# Patient Record
Sex: Male | Born: 1950 | Race: White | Hispanic: No | Marital: Married | Smoking: Never smoker
Health system: Southern US, Community
[De-identification: ages and names within clinical notes are randomized; demographics above are authoritative.]

## PROBLEM LIST (undated history)

## (undated) DIAGNOSIS — H6121 Impacted cerumen, right ear: Secondary | ICD-10-CM

## (undated) DIAGNOSIS — D696 Thrombocytopenia, unspecified: Secondary | ICD-10-CM

## (undated) DIAGNOSIS — F5101 Primary insomnia: Secondary | ICD-10-CM

## (undated) DIAGNOSIS — B3749 Other urogenital candidiasis: Secondary | ICD-10-CM

## (undated) DIAGNOSIS — L219 Seborrheic dermatitis, unspecified: Secondary | ICD-10-CM

## (undated) DIAGNOSIS — M7918 Myalgia, other site: Secondary | ICD-10-CM

## (undated) DIAGNOSIS — K219 Gastro-esophageal reflux disease without esophagitis: Secondary | ICD-10-CM

## (undated) DIAGNOSIS — E782 Mixed hyperlipidemia: Secondary | ICD-10-CM

## (undated) DIAGNOSIS — I10 Essential (primary) hypertension: Secondary | ICD-10-CM

## (undated) DIAGNOSIS — J069 Acute upper respiratory infection, unspecified: Secondary | ICD-10-CM

## (undated) HISTORY — DX: Seborrheic dermatitis, unspecified: L21.9

## (undated) HISTORY — PX: ABDOMINAL EXPLORATION SURGERY: SHX538

## (undated) HISTORY — DX: Mixed hyperlipidemia: E78.2

## (undated) HISTORY — DX: Thrombocytopenia, unspecified: D69.6

## (undated) HISTORY — DX: Other urogenital candidiasis: B37.49

## (undated) HISTORY — DX: Impacted cerumen, right ear: H61.21

## (undated) HISTORY — PX: ANKLE FRACTURE SURGERY: SHX122

## (undated) HISTORY — DX: Acute upper respiratory infection, unspecified: J06.9

## (undated) HISTORY — DX: Primary insomnia: F51.01

## (undated) HISTORY — DX: Myalgia, other site: M79.18

## (undated) HISTORY — DX: Gastro-esophageal reflux disease without esophagitis: K21.9
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
## (undated) NOTE — *Deleted (*Deleted)
PMR Admission Coordinator Pre-Admission Assessment  Patient: Tim Day is an 51 y.o., male MRN: [id number] DOB: [birth year] Height: 5\' 10"  (177.8 cm) Weight: 86.2 kg  Insurance Information HMO:     PPO:      PCP:      IPA:      80/20:      OTHER:  PRIMARY: [employer]      Policy#: [id number]      Subscriber: pt CM Name: Hilton Cork      Phone#: [phone]     Fax#: [phone] Pre-Cert#: [id number]   Initial denial   Employer:  Benefits:  Phone #: [phone]     Name: 10/20 Eff. Date: 12/03/2019     Deduct: none      Out of Pocket Max: $500      Life Max: none CIR: 80%      SNF: 80% limited to 90 days Outpatient: $17 per visit     Co-Pay: 60 visits combined limit Home Health: 100%      Co-Pay: limited visits per medcial neccesity DME: 100%     Co-Pay: none Providers: in network  SECONDARY: Medicare part A only      Policy#: [id number]     Active 07/02/2018  Financial Counselor:       Phone#:   The "Data Collection Information Summary" for patients in Inpatient Rehabilitation Facilities with attached "Privacy Act Statement-Health Care Records" was provided and verbally reviewed with: Patient and Family  Emergency Contact Information Contact Information    Name Relation Home Work Mobile   Lazar,Betty Spouse   [phone]      Current Medical History  Patient Admitting Diagnosis: Debility; respiratory failure post COVID  History of Present Illness: ***    Patient's medical record from [facility] has been reviewed by the rehabilitation admission coordinator and physician.  Past Medical History  Past Medical History:  Diagnosis Date  . Acute respiratory disease   . Atypical mole 12/30/2012   severe left post shoulder tx exc  . Candidiasis of urogenital sites   . Diabetes mellitus   . Diffuse myofascial pain syndrome   . Esophageal reflux   . Hypertension   . Impacted cerumen of right ear   . Melanoma (HCC) 06/14/2011   left  ear mohs  . Mixed hyperlipidemia   . 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  . Primary insomnia   . Seborrheic dermatitis, unspecified   . Squamous cell carcinoma of skin 06/14/2011   left forearm medial cx3 25fu  . Thrombocytopenia, unspecified (HCC)     Family History   family history includes CAD in his father; Diabetes in his mother.  Prior Rehab/Hospitalizations Has the patient had prior rehab or hospitalizations prior to admission? Yes  Has the patient had major surgery during 100 days prior to admission? Yes   Current Medications  Current Facility-Administered Medications:  .  0.9 %  sodium chloride infusion, 250 mL, Intravenous, Continuous, Gleason, Darcella Gasman, PA-C .  acetaminophen (TYLENOL) suppository 650 mg, 650 mg, Rectal, Q6H PRN, 650 mg at 09/17/20 1826 **OR** acetaminophen (TYLENOL) tablet 650 mg, 650 mg, Oral, Q6H PRN, Gertha Calkin, MD, 650 mg at 09/26/20 0015 .  albuterol (VENTOLIN HFA) 108 (90 Base) MCG/ACT inhaler 2 puff, 2 puff, Inhalation, Q6H PRN, Noralee Stain, DO .  ALPRAZolam Prudy Feeler) tablet 0.25 mg, 0.25 mg, Oral, TID PRN, Hughie Closs, MD, 0.25 mg at 09/26/20 0015 .  alum & mag hydroxide-simeth (  MAALOX/MYLANTA) 200-200-20 MG/5ML suspension 30 mL, 30 mL, Oral, Q4H PRN, Erick Blinks, MD, 30 mL at 09/24/20 2150 .  apixaban (ELIQUIS) tablet 5 mg, 5 mg, Oral, BID, Mosetta Anis, RPH, 5 mg at 09/26/20 1027 .  chlorhexidine (PERIDEX) 0.12 % solution 15 mL, 15 mL, Mouth Rinse, BID, Chand, Sudham, MD, 15 mL at 09/26/20 1027 .  Chlorhexidine Gluconate Cloth 2 % PADS 6 each, 6 each, Topical, Daily, Renda Rolls Z, DO, 6 each at 09/26/20 1028 .  collagenase (SANTYL) ointment, , Topical, BID, Pieter Partridge, MD, Given at 09/26/20 1028 .  docusate sodium (COLACE) capsule 100 mg, 100 mg, Oral, BID, Royce Macadamia, RPH, 100 mg at 09/26/20 1028 .  feeding supplement (ENSURE ENLIVE) (ENSURE ENLIVE) liquid 237 mL,  237 mL, Oral, TID BM, Renda Rolls Z, DO, 237 mL at 09/26/20 1027 .  Gerhardt's butt cream, , Topical, PRN, Lanae Boast, MD, Given at 09/13/20 0855 .  hydrocortisone cream 1 %, , Topical, BID, Renda Rolls Z, [place], Given at 09/26/20 1029 .  insulin aspart (novoLOG) injection 0-15 Units, 0-15 Units, Subcutaneous, TID WC, Kc, Ramesh, MD, 3 Units at 09/26/20 1254 .  insulin aspart (novoLOG) injection 0-5 Units, 0-5 Units, Subcutaneous, QHS, Kc, Ramesh, MD, 2 Units at 09/22/20 2251 .  insulin aspart (novoLOG) injection 15 Units, 15 Units, Subcutaneous, TID WC, Dorcas Carrow, MD, 15 Units at 09/26/20 1254 .  insulin glargine (LANTUS) injection 38 Units, 38 Units, Subcutaneous, BID, Hughie Closs, MD, 38 Units at 09/26/20 1029 .  linagliptin (TRADJENTA) tablet 5 mg, 5 mg, Oral, Daily, Chand, Sudham, MD, 5 mg at 09/26/20 1028 .  MEDLINE mouth rinse, 15 mL, Mouth Rinse, q12n4p, Chand, Sudham, MD, 15 mL at 09/26/20 1255 .  melatonin tablet 3 mg, 3 mg, Oral, QHS, Albertine Grates, MD, 3 mg at 09/25/20 2155 .  ondansetron (ZOFRAN) tablet 4 mg, 4 mg, Oral, Q6H PRN **OR** ondansetron (ZOFRAN) injection 4 mg, 4 mg, Intravenous, Q6H PRN, Chand, Sudham, MD .  pantoprazole (PROTONIX) EC tablet 40 mg, 40 mg, Oral, Daily, Ghimire, Kuber, MD, 40 mg at 09/26/20 1028 .  phenol (CHLORASEPTIC) mouth spray 1 spray, 1 spray, Mouth/Throat, PRN, Kc, Ramesh, MD, 1 spray at 08/31/20 0105 .  pneumococcal 23 valent vaccine (PNEUMOVAX-23) injection 0.5 mL, 0.5 mL, Intramuscular, Prior to discharge, Kc, Ramesh, MD .  polyethylene glycol (MIRALAX / GLYCOLAX) packet 17 g, 17 g, Oral, Daily PRN, Cheri Fowler, MD, 17 g at 09/19/20 0955 .  predniSONE (DELTASONE) tablet 30 mg, 30 mg, Oral, Q breakfast, Noralee Stain, DO, 30 mg at 09/26/20 0834 .  sodium chloride (OCEAN) 0.65 % nasal spray 1 spray, 1 spray, Each Nare, PRN, Kalman Shan, MD .  tamsulosin (FLOMAX) capsule 0.4 mg, 0.4 mg, Oral, Daily, McQuaid, Douglas B, MD, 0.4 mg at  09/26/20 1027 .  zolpidem (AMBIEN) tablet 10 mg, 10 mg, Oral, QHS PRN, Dorcas Carrow, MD, 10 mg at 09/25/20 2203  Patients Current Diet:  Diet Order            Diet Carb Modified Fluid consistency: Thin; Room service appropriate? Yes  Diet effective now                 Precautions / Restrictions Precautions Precautions: Fall Precaution Comments: High RR, watch sats Restrictions Weight Bearing Restrictions: No   Has the patient had 2 or more falls or a fall with injury in the past year? 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 Independent  Indoor Mobility: Did the patient need assistance with walking from room to room (with or without device)? Independent  Stairs: Did the patient need assistance with internal or external stairs (with or without device)? Independent  Functional Cognition: Did the patient need help planning regular tasks such as shopping or remembering to take medications? Independent  Home Assistive Devices / Equipment Home Assistive Devices/Equipment: Oxygen Home Equipment: None  Prior Device Use: Indicate devices/aids used by the patient prior to current illness, exacerbation or injury? None of the above  Current Functional Level Cognition  Overall Cognitive Status: Impaired/Different from baseline Current Attention Level: Selective Orientation Level: Oriented X4 Safety/Judgement: Decreased awareness of safety General Comments: Frequent reassurance to ease anxiety    Extremity Assessment (includes Sensation/Coordination)  Upper Extremity Assessment: Generalized weakness  Lower Extremity Assessment: Defer to PT evaluation    ADLs  Overall ADL's : Needs assistance/impaired Eating/Feeding: Set up, Sitting Grooming: Oral care, Wash/dry face, Applying deodorant, Set up, Sitting Upper Body  Bathing: Sitting, Set up (simulated) Lower Body Bathing: Moderate assistance, Bed level Lower Body Bathing Details (indicate cue type and reason): simulated by raising HOB and using figure four positioning of BLE Upper Body Dressing : Maximal assistance, Sitting Lower Body Dressing: Maximal assistance, Sit to/from stand Lower Body Dressing Details (indicate cue type and reason): while sitting EOB, assisted ptin assuming and maintaining figure 4 position while pt pulled socks up over heels Toilet Transfer: Moderate assistance, +2 for physical assistance, +2 for safety/equipment Toilet Transfer Details (indicate cue type and reason): pt able to progress with use of RW this date Toileting- Architect and Hygiene: Total assistance Toileting - Clothing Manipulation Details (indicate cue type and reason): incontinent of bowels this session Functional mobility during ADLs: Moderate assistance, +2 for physical assistance, Cueing for safety, Cueing for sequencing, Rolling walker General ADL Comments: Pt able to progress to using RW this date for small bouts of mobility. Continues to require increased rest breaks, cues for energy conservation. Pt has implemented strategies from previous OT session by doing more and more for himself.    Mobility  Overal bed mobility: Needs Assistance Bed Mobility: Rolling, Sidelying to Sit Rolling: Min assist Sidelying to sit: Mod assist, HOB elevated Supine to sit: Mod assist, HOB elevated Sit to supine: Mod assist General bed mobility comments: Assist to elevate trunk into sitting    Transfers  Overall transfer level: Needs assistance Equipment used: Rolling walker (2 wheeled) Transfer via Lift Equipment: Stedy Transfers: Sit to/from Stand, [employer] Transfers Sit to Stand: Mod assist, +2 safety/equipment Stand pivot transfers: Mod assist, +2 safety/equipment General transfer comment: Assist to bring hips up and for balance. Repeated verbal cues for  hand placement. Bed to bsc with walker with stand pivot    Ambulation / Gait / Stairs / Wheelchair Mobility  Ambulation/Gait Ambulation/Gait assistance: Mod assist, +2 safety/equipment Gait Distance (Feet): 10 Feet Assistive device: Rolling walker (2 wheeled) Gait Pattern/deviations: Step-to pattern, Shuffle, Narrow base of support, Decreased stride length, Decreased step length - right, Decreased step length - left General Gait Details: Assist for balance and support. Pt needs frequent verbal encouragement to continue.  Gait velocity: decr Gait velocity interpretation: <1.31 ft/sec, indicative of household ambulator    Posture / Balance Dynamic Sitting Balance Sitting balance - Comments: Required UE but stable at EOB. Balance Overall balance assessment: Needs assistance Sitting-balance support: Bilateral upper  extremity supported, Feet supported Sitting balance-Leahy Scale: Poor Sitting balance - Comments: Required UE but stable at EOB. Postural control: Right lateral lean Standing balance support: Bilateral upper extremity supported, During functional activity Standing balance-Leahy Scale: Poor Standing balance comment: walker and min assist. Stood x 90 sec for pericare    Special needs/care consideration Wife has been staying with patient overnight due to hi high anxiety and depression Non latex 14 fr urethral catheter placed 09/06/20       Buttocks bilateral stage 2 with twice daily       dressing changes      Low air loss bed      O2 at 3 liters [place]; BiPAP at HS; did not use      BiPAP or CPAP pta Began BiPAP due to      Hypercarbia and increased work of breathing  Previous Home Environment  Living Arrangements: Spouse/significant other  Lives With: Spouse Available Help at Discharge: Family, Available 24 hours/day Type of Home: House Home Layout: One level Home Access: Stairs to enter Entrance Stairs-Rails: Right Entrance Stairs-Number of Steps: 6 Bathroom Shower/Tub:  Health visitor: Standard Bathroom Accessibility: Yes How Accessible: Accessible via walker Home Care Services: No Additional Comments: Wife taking fmla  Discharge Living Setting Plans for Discharge Living Setting: Patient's home, Lives with (comment) (wife) Type of Home at Discharge: House Discharge Home Layout: One level Discharge Home Access: Stairs to enter Entrance Stairs-Rails: Right Entrance Stairs-Number of Steps: 6 Discharge Bathroom Shower/Tub: Walk-in shower Discharge Bathroom Toilet: Standard Discharge Bathroom Accessibility: Yes How Accessible: Accessible via walker Does the patient have any problems obtaining your medications?: No  Social/Family/Support Systems Patient Roles: Spouse (self employed and very active) Solicitor Information: wife, Kathie Rhodes Anticipated Caregiver: wife Anticipated Industrial/product designer Information: see above Ability/Limitations of Caregiver: wife taking FMLA Caregiver Availability: 24/7 Discharge Plan Discussed with Primary Caregiver: Yes Is Caregiver In Agreement with Plan?: Yes Does Caregiver/Family have Issues with Lodging/Transportation while Pt is in Rehab?: No  Goals Patient/Family Goal for Rehab: supervision PT, supervision to min OT Expected length of stay: ELOS 2 to 3 weeks Pt/Family Agrees to Admission and willing to participate: Yes Program Orientation Provided & Reviewed with Pt/Caregiver Including Roles  & Responsibilities: Yes  Decrease burden of Care through IP rehab admission: n/a  Possible need for SNF placement upon discharge: not anticipated  Patient Condition: I have reviewed medical records from [facility], spoken with CM, and patient and spouse. I met with patient at the bedside for inpatient rehabilitation assessment.  Patient will benefit from ongoing PT and OT, can actively participate in 3 hours of therapy a day 5 days of the week, and can make measurable gains during the admission.  Patient  will also benefit from the coordinated team approach during an Inpatient Acute Rehabilitation admission.  The patient will receive intensive therapy as well as Rehabilitation physician, nursing, social worker, and care management interventions.  Due to bladder management, bowel management, safety, skin/wound care, disease management, medication administration, pain management and patient education the patient requires 24 hour a day rehabilitation nursing.  The patient is currently *** with mobility and basic ADLs.  Discharge setting and therapy post discharge at [facility] IP discharge location:[id number]} is anticipated.  Patient has agreed to participate in the Acute Inpatient Rehabilitation Program and will admit {Time; today/tomorrow:10263}.  Preadmission Screen Completed By:  Clois Dupes, 09/26/2020 3:47 PM ______________________________________________________________________   Discussed status with Dr. Marland Kitchen on *** at *** and received approval  for admission today.  Admission Coordinator:  Clois Dupes, RN, time Marland KitchenDorna Bloom ***   Assessment/Plan: Diagnosis: 1. Does the need for close, 24 hr/day Medical supervision in concert with the patient's rehab needs make it unreasonable for this patient to be served in a less intensive setting? {yes_no_potentially:3041433} 2. Co-Morbidities requiring supervision/potential complications: *** 3. Due to {due ZO:1096045}, does the patient require 24 hr/day rehab nursing? {yes_no_potentially:3041433} 4. Does the patient require coordinated care of a physician, rehab nurse, PT, OT, and SLP to address physical and functional deficits in the context of the above medical diagnosis(es)? {yes_no_potentially:3041433} Addressing deficits in the following areas: {deficits:3041436} 5. Can the patient actively participate in an intensive therapy program of at least 3 hrs of therapy 5 days a week? {yes_no_potentially:3041433} 6. The potential for patient to  make measurable gains while on inpatient rehab is {potential:3041437} 7. Anticipated functional outcomes upon discharge from inpatient rehab: {functional outcomes:304600100} PT, {functional outcomes:304600100} OT, {functional outcomes:304600100} SLP 8. Estimated rehab length of stay to reach the above functional goals is: *** 9. Anticipated discharge destination: {anticipated dc setting:21604} 10. Overall Rehab/Functional Prognosis: {potential:3041437}   MD Signature: ***

---

## 2002-10-01 ENCOUNTER — Ambulatory Visit (HOSPITAL_COMMUNITY): Admission: RE | Admit: 2002-10-01 | Discharge: 2002-10-01 | Payer: Self-pay | Admitting: Family Medicine

## 2003-09-12 ENCOUNTER — Encounter: Payer: Self-pay | Admitting: Family Medicine

## 2003-09-12 ENCOUNTER — Ambulatory Visit (HOSPITAL_COMMUNITY): Admission: RE | Admit: 2003-09-12 | Discharge: 2003-09-12 | Payer: Self-pay | Admitting: Family Medicine

## 2003-09-19 ENCOUNTER — Ambulatory Visit (HOSPITAL_COMMUNITY): Admission: RE | Admit: 2003-09-19 | Discharge: 2003-09-19 | Payer: Self-pay | Admitting: Family Medicine

## 2003-09-19 ENCOUNTER — Encounter: Payer: Self-pay | Admitting: Family Medicine

## 2005-03-22 ENCOUNTER — Ambulatory Visit (HOSPITAL_COMMUNITY): Admission: RE | Admit: 2005-03-22 | Discharge: 2005-03-22 | Payer: Self-pay | Admitting: Family Medicine

## 2005-03-28 ENCOUNTER — Ambulatory Visit (HOSPITAL_COMMUNITY): Admission: RE | Admit: 2005-03-28 | Discharge: 2005-03-28 | Payer: Self-pay | Admitting: Family Medicine

## 2006-12-02 HISTORY — PX: SHOULDER SURGERY: SHX246

## 2008-08-17 ENCOUNTER — Emergency Department (HOSPITAL_COMMUNITY): Admission: EM | Admit: 2008-08-17 | Discharge: 2008-08-17 | Payer: Self-pay | Admitting: Emergency Medicine

## 2008-08-25 ENCOUNTER — Ambulatory Visit (HOSPITAL_BASED_OUTPATIENT_CLINIC_OR_DEPARTMENT_OTHER): Admission: RE | Admit: 2008-08-25 | Discharge: 2008-08-26 | Payer: Self-pay | Admitting: Orthopedic Surgery

## 2008-09-01 ENCOUNTER — Encounter: Admission: RE | Admit: 2008-09-01 | Discharge: 2008-09-01 | Payer: Self-pay | Admitting: Orthopedic Surgery

## 2008-11-07 ENCOUNTER — Encounter: Admission: RE | Admit: 2008-11-07 | Discharge: 2008-11-07 | Payer: Self-pay | Admitting: Orthopedic Surgery

## 2008-12-14 ENCOUNTER — Encounter: Admission: RE | Admit: 2008-12-14 | Discharge: 2008-12-14 | Payer: Self-pay | Admitting: Orthopedic Surgery

## 2008-12-28 ENCOUNTER — Encounter: Admission: RE | Admit: 2008-12-28 | Discharge: 2008-12-28 | Payer: Self-pay | Admitting: Orthopedic Surgery

## 2009-01-04 ENCOUNTER — Ambulatory Visit: Payer: Self-pay | Admitting: Vascular Surgery

## 2009-01-30 ENCOUNTER — Ambulatory Visit (HOSPITAL_COMMUNITY): Admission: RE | Admit: 2009-01-30 | Discharge: 2009-01-31 | Payer: Self-pay | Admitting: Orthopedic Surgery

## 2009-07-31 ENCOUNTER — Observation Stay (HOSPITAL_COMMUNITY): Admission: RE | Admit: 2009-07-31 | Discharge: 2009-08-01 | Payer: Self-pay | Admitting: Urology

## 2009-07-31 ENCOUNTER — Encounter (INDEPENDENT_AMBULATORY_CARE_PROVIDER_SITE_OTHER): Payer: Self-pay | Admitting: Urology

## 2011-01-15 ENCOUNTER — Telehealth: Payer: Self-pay | Admitting: Family Medicine

## 2011-02-01 NOTE — Telephone Encounter (Signed)
Opened in error

## 2011-03-09 LAB — GLUCOSE, CAPILLARY
Glucose-Capillary: 126 mg/dL — ABNORMAL HIGH (ref 70–99)
Glucose-Capillary: 145 mg/dL — ABNORMAL HIGH (ref 70–99)
Glucose-Capillary: 152 mg/dL — ABNORMAL HIGH (ref 70–99)
Glucose-Capillary: 156 mg/dL — ABNORMAL HIGH (ref 70–99)
Glucose-Capillary: 162 mg/dL — ABNORMAL HIGH (ref 70–99)
Glucose-Capillary: 174 mg/dL — ABNORMAL HIGH (ref 70–99)
Glucose-Capillary: 176 mg/dL — ABNORMAL HIGH (ref 70–99)

## 2011-03-09 LAB — CBC
HCT: 45.1 % (ref 39.0–52.0)
Hemoglobin: 15.7 g/dL (ref 13.0–17.0)
MCHC: 34.8 g/dL (ref 30.0–36.0)
MCV: 85.8 fL (ref 78.0–100.0)
Platelets: 151 10*3/uL (ref 150–400)
RBC: 5.25 MIL/uL (ref 4.22–5.81)
RDW: 13.8 % (ref 11.5–15.5)
WBC: 5.9 10*3/uL (ref 4.0–10.5)

## 2011-03-09 LAB — COMPREHENSIVE METABOLIC PANEL
ALT: 32 U/L (ref 0–53)
AST: 23 U/L (ref 0–37)
Albumin: 3.9 g/dL (ref 3.5–5.2)
Alkaline Phosphatase: 49 U/L (ref 39–117)
BUN: 16 mg/dL (ref 6–23)
CO2: 26 mEq/L (ref 19–32)
Calcium: 9.8 mg/dL (ref 8.4–10.5)
Chloride: 106 mEq/L (ref 96–112)
Creatinine, Ser: 1 mg/dL (ref 0.4–1.5)
GFR calc Af Amer: >60 mL/min (ref >60)
GFR calc non Af Amer: >60 mL/min (ref >60)
Glucose, Bld: 144 mg/dL — ABNORMAL HIGH (ref 70–99)
Potassium: 4.2 mEq/L (ref 3.5–5.1)
Sodium: 138 mEq/L (ref 135–145)
Total Bilirubin: 0.8 mg/dL (ref 0.3–1.2)
Total Protein: 6.7 g/dL (ref 6.0–8.3)

## 2011-03-09 LAB — TYPE AND SCREEN
ABO/RH(D): B NEG
Antibody Screen: NEGATIVE

## 2011-03-09 LAB — PROTIME-INR
INR: 1 (ref 0.00–1.49)
Prothrombin Time: 13.4 seconds (ref 11.6–15.2)

## 2011-03-09 LAB — ABO/RH: ABO/RH(D): B NEG

## 2011-03-09 LAB — APTT: aPTT: 28 seconds (ref 24–37)

## 2011-03-14 LAB — CBC
HCT: 36 % — ABNORMAL LOW (ref 39.0–52.0)
Hemoglobin: 12.7 g/dL — ABNORMAL LOW (ref 13.0–17.0)
RDW: 14.8 % (ref 11.5–15.5)
WBC: 5.9 10*3/uL (ref 4.0–10.5)

## 2011-03-14 LAB — BASIC METABOLIC PANEL
Calcium: 8.5 mg/dL (ref 8.4–10.5)
GFR calc Af Amer: >60 mL/min (ref >60)
GFR calc non Af Amer: >60 mL/min (ref >60)
Glucose, Bld: 157 mg/dL — ABNORMAL HIGH (ref 70–99)
Potassium: 4 mEq/L (ref 3.5–5.1)
Sodium: 135 mEq/L (ref 135–145)

## 2011-03-14 LAB — PROTIME-INR: Prothrombin Time: 13.9 seconds (ref 11.6–15.2)

## 2011-03-14 LAB — GLUCOSE, CAPILLARY
Glucose-Capillary: 131 mg/dL — ABNORMAL HIGH (ref 70–99)
Glucose-Capillary: 153 mg/dL — ABNORMAL HIGH (ref 70–99)

## 2011-03-19 LAB — DIFFERENTIAL
Lymphocytes Relative: 28 % (ref 12–46)
Lymphs Abs: 1.5 10*3/uL (ref 0.7–4.0)
Neutro Abs: 3.3 10*3/uL (ref 1.7–7.7)
Neutrophils Relative %: 62 % (ref 43–77)

## 2011-03-19 LAB — BASIC METABOLIC PANEL
BUN: 13 mg/dL (ref 6–23)
Calcium: 9.3 mg/dL (ref 8.4–10.5)
Creatinine, Ser: 0.81 mg/dL (ref 0.4–1.5)
GFR calc non Af Amer: >60 mL/min (ref >60)
Potassium: 4.2 mEq/L (ref 3.5–5.1)

## 2011-03-19 LAB — CBC
HCT: 43.8 % (ref 39.0–52.0)
Platelets: 158 10*3/uL (ref 150–400)
WBC: 5.4 10*3/uL (ref 4.0–10.5)

## 2011-03-19 LAB — URINALYSIS, ROUTINE W REFLEX MICROSCOPIC
Ketones, ur: NEGATIVE mg/dL
Protein, ur: NEGATIVE mg/dL
Urobilinogen, UA: 0.2 mg/dL (ref 0.0–1.0)

## 2011-03-19 LAB — PROTIME-INR
INR: 3.6 — ABNORMAL HIGH (ref 0.00–1.49)
Prothrombin Time: 38.8 seconds — ABNORMAL HIGH (ref 11.6–15.2)

## 2011-03-19 LAB — URINE MICROSCOPIC-ADD ON

## 2011-04-16 NOTE — Assessment & Plan Note (Signed)
OFFICE VISIT   Tim Day, Tim Day  DOB:  04/13/1951                                       01/04/2009  EAVWU#:[id number]   The patient is a 60 year old referred by Dr. Ranell Patrick for evaluation of  left upper extremity DVT.  In September of 2009 he had a subscapularis  repair and shoulder arthroscopy.  After the procedure he developed a  left upper extremity DVT.  He was placed on Coumadin for this.  He did  not have any evidence of pulmonary embolus.  He developed left upper  extremity arm swelling 3-4 days after his procedure.  Since being on  Coumadin the arm swelling has subsided significantly.  He now requires  further repair on his left shoulder and is referred by Dr. Ranell Patrick for  evaluation of his DVT with recommendations regarding his Coumadin  therapy.   His atherosclerotic risk factors include diabetes, elevated cholesterol,  hypertension.   MEDICATIONS:  1. Include Avandamet 03/999 one tablet b.i.d.  2. Januvia 100 mg daily.  3. Crestor 5 mg once a day.  4. Diovan 160 mg once a day.  5. Warfarin 15 mg daily.   ALLERGIES:  He lists allergies to Ceclor which causes a rash and  oxycodone which causes a rash.   PAST SURGICAL HISTORY:  He had facial fractures in the past.   PAST MEDICAL HISTORY:  No other prior episodes of DVT or pulmonary  embolus.  Otherwise, as listed above.   FAMILY HISTORY:  Unremarkable.   SOCIAL HISTORY:  He is married.  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  He is a nonsmoker and nonconsumer of alcohol.   REVIEW OF SYSTEMS:  For cardiac, pulmonary, GI, renal, vascular,  neurologic, orthopedic, psychiatric, ENT and hematologic were all  negative.   PHYSICAL EXAM:  Vital signs:  Blood pressure is 146/93 in the right arm,  pulse is 90 and regular, temperature 98.4.  HEENT:  Unremarkable.  Neck:  Has 2+ carotid pulses without bruit.  Chest:  Clear to auscultation.  Cardiac:  Exam is regular rate and  rhythm.  Abdomen:  Is slightly obese,  soft, nontender, nondistended with no masses.  Extremities:  Left upper  extremity he has edema over the triceps muscle posteriorly in the left  upper arm.  He has 2+ brachial and radial pulses bilaterally.  He has no  significant forearm or hand edema bilaterally.   He had a venous duplex exam of his left upper extremity today.  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  This was at the mid distal brachium level.  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  There was no evidence of  acute DVT in the left upper extremity.   The patient has had now almost 4-1/2 months of Coumadin therapy.  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  It  seems chronic in appearance on duplex exam and well incorporated.  I  believe his DVT risk for his next shoulder procedure should be similar  to that of any other patient undergoing a shoulder orthopedic procedure.  I discussed with the patient and with Dr. Ranell Patrick today DVT prophylaxis  measures for the patient at his next procedure.  I also explained  to the  patient that he still would be at risk of DVT just as he was on the  initial procedure but that his risk of pulmonary embolus from his  chronic DVT would be extremely low and that overall his DVT or pulmonary  embolus risk should be no higher than anyone undergoing a shoulder  procedure.   Janetta Hora. Fields, MD  Electronically Signed   CEF/MEDQ  D:  01/05/2009  T:  01/05/2009  Job:  [id number]   cc:   Almedia Balls. Ranell Patrick, M.D.
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
## 2011-04-16 NOTE — Op Note (Signed)
NAMEBRANNEN, KOPPEN NO.:  [id number]   MEDICAL RECORD NO.:  [id number]          PATIENT TYPE:  OIB   LOCATION:  5041                         FACILITY:  [facility]   PHYSICIAN:  Almedia Balls. Ranell Patrick, M.D. DATE OF BIRTH:  05-28-1951   DATE OF PROCEDURE:  01/30/2009  DATE OF DISCHARGE:                               OPERATIVE REPORT   PREOPERATIVE DIAGNOSIS:  Left shoulder stiffness following subscapularis  and rotator cuff repairs.   POSTOPERATIVE DIAGNOSIS:  Left shoulder stiffness following subscapular  and rotator cuff repairs.   PROCEDURES PERFORMED:  1. Left shoulder exam under anesthesia.  2. Shoulder arthroscopy with extensive intraarticular debridement      including arthroscopic capsular release and debridement and lysis      of adhesions followed by subacromial lysis of adhesions and then      open lysis of adhesions.   ATTENDING SURGEON:  Almedia Balls. Ranell Patrick, MD   ASSISTANT:  Donnie Coffin. Dixon, PA-C   ANESTHESIA:  General anesthesia was used.   ESTIMATED BLOOD LOSS:  Minimal.   FLUID REPLACEMENT:  1200 mL crystalloid.   INSTRUMENT COUNTS:  Correct.   COMPLICATIONS:  None.   Preoperative antibiotics were given.   INDICATIONS:  The patient is a 60 year old male who suffered a posterior  shoulder dislocation and subscapularis injury, which was treated by  shoulder arthroscopy and open subscapularis and rotator cuff repair.  The patient went on to heal his shoulder surgery, but developed  postoperative stiffness.  He also had a postoperative upper extremity  DVT.  The patient was subsequently treated for that and now presents  back for stiff shoulder and likely lysis of adhesions.  Informed consent  was obtained.   DESCRIPTION OF PROCEDURE:  After an adequate level of anesthesia  achieved, the patient was positioned supine on the operating table.  He  was brought up in the modified beach chair position.  All neurovascular  structures were padded  appropriately.  The left shoulder was examined.  Passively forward elevation about 95 degrees.  Abduction was limited to  45 degrees.  External rotation is 0.  Internal rotation is about 30  degrees with the arm abducted.  We went ahead and sterilely prepped and  draped the shoulder in the usual manner.  We entered using standard  arthroscopic portals including anterior, posterior, and lateral portals.  The patient had a very stiff shoulder, very little space inside.  We  identified the subscapularis, which was intact.  We went ahead and  released glenohumeral ligament in the surrounding of rotator interval as  well as the anteroinferior glenohumeral ligament.  We did a near 360-  degree capsular release.  We could not quite get down to the bottom of  the shoulder, but I did manipulate once we were done with the anterior  and posterior releases.  There was noted to be significant  chondromalacia present posteroinferiorly consistent with his prior  dislocation.  In absence of the posterior labrum with the capsular  release complete, the manipulation performed, we had about 60-degree  rotation in terms of external rotation, which was much much improved.  The  results are a little bit of pop with forward elevation manipulation  indicating posteroinferior capsular disruption.  At this point, we  placed the scope in the subacromial space, had a thorough subacromial  release of adhesions.  Next, I went ahead and made a small incision in  the anterior incision where the mini-open incision was used before.  I  put my finger in that incision and was able to sweep the subscapularis,  which was palpable and felt to be intact.  Rotator interval was  released.  I did not feel any undue scar tissue to the coracoid and  released up onto the acromion and under the deltoid and made sure the  entire subdeltoid interval was released and it was.  At this point, the  range of motion was significantly improved.   We concluded the surgery  with suturing the wounds with 4-0 Monocryl followed by Steri-Strips and  sterile dressing.  The patient tolerated the surgery well.      Almedia Balls. Ranell Patrick, M.D.  Electronically Signed     SRN/MEDQ  D:  01/30/2009  T:  01/31/2009  Job:  [id number]

## 2011-04-16 NOTE — Op Note (Signed)
NAMEPRATT, Tim Day          ACCOUNT NO.:  [id number]   MEDICAL RECORD NO.:  [id number]          PATIENT TYPE:  INP   LOCATION:  0099                         FACILITY:  [facility]   PHYSICIAN:  Mark C. Vernie Ammons, M.D.  DATE OF BIRTH:  [birth year]   DATE OF PROCEDURE:  07/31/2009  DATE OF DISCHARGE:                               OPERATIVE REPORT   PREOPERATIVE DIAGNOSIS:  Pelvic mass.   POSTOPERATIVE DIAGNOSIS:  Pelvic mass.   PROCEDURE:  Exploritory laparotomy.  Excision of pelvic mass.   SURGEON:  Mark C. Vernie Ammons, M.D.   ASSISTANT:  Edward Qualia.   ANESTHESIA:  General.   DRAINS:  20-French Foley catheter.   SPECIMENS:  Pelvic mass to pathology.   ESTIMATED BLOOD LOSS:  Minimal.   COMPLICATIONS:  None.   INDICATIONS:  The patient is a 60 year old male who was seen initially  for microscopic hematuria.  His workup included a CT scan which revealed  normal upper tracts and the finding of a stippled calcification of a  mass near the dome of the bladder.  Cystoscopy revealed no lesions  within the bladder and specifically no evidence of any abnormality in  the area of the dome/urachus.  He has a history of having undergone some  form of hypospadias repair as an infant and has a lower midline scar  with some extra scar in the middle, almost suggestive of a suprapubic  catheter scar.  I discussed with the patient the need for surgical  excision of this concerning lesion and went over the procedure in  detail, as well as its risks, complications, and the alternatives.  He  understands and elected to proceed.   DESCRIPTION OF OPERATION:  After informed consent, the patient was  brought to the major OR, placed on the table, and administered general  anesthesia, then had his abdomen and genitalia sterilely prepped and  draped.  An official time-out was then performed.  The urethra was noted  to be located in a subcoronal location to the right of the midline,  consistent with  his previous hypospadias repair.  This was dilated with  Sissy Hoff sounds to 22-French and then a 20-French Foley catheter was  passed into the bladder without difficulty.   A midline incision was then made in the area of his previous scar and  carried down through the external rectus fascia.  The rectus muscle  bellies were identified and parted in the midline, although there was a  great deal of scar in this location.  Further incision through the scar  allowed entry into the abdominal cavity in a controlled fashion.  The  peritoneal incision was then lengthened proximally and distally.   I was able to palpate the lesion at the dome of the bladder and if felt  smooth and mobile.  It did not appear to be associated with the urachal  remnant.  I therefore placed a retractor and was able to visualize the  lesion.  The bladder was then filled with sterile saline and the lesion  could be identified, but it was not associated with the exact dome, but  more posterior.  It appeared smooth and round, as well as flat.  There  were some adhesions of the bowel that were taken off of the superior  aspect and the inferior aspect was dissected from the peritoneum, which  was overlying the bladder.  It did not appear to involve the detrusor  muscle in any way.  This was sent for frozen section.   After frozen section report returned, showing the lesion was calcified  and could not be easily cut for frozen section; however, it appeared to  the pathologist to be calcified fat, which is clinically what is  appeared to be as well.  We therefore elected not to do any further  excision, as it appeared the lesion was benign.  The peritoneum was  irrigated and attention was then directed to the bladder.   The detrusor muscle was noted to have been adherent to the undersurface  of the midline scar and the detrusor had been thinned somewhat.  I  therefore reapproximated the detrusor muscle in a transverse  fashion  using running 3-0 chromic suture.  The wound was irrigated again and the  anterior rectus fascia was then reapproximated in the midline with 0 PDS  suture.  The wound was irrigated a final time and the skin was  reapproximated with skin staples.  The patient tolerated the procedure  well and there were no intraoperative complications.  Needle, sponge,  and instrument counts were correct x2 at the end of the operation.   He will be observed overnight with the plan to discharge home in the  morning.      Mark C. Vernie Ammons, M.D.  Electronically Signed     MCO/MEDQ  D:  07/31/2009  T:  07/31/2009  Job:  [id number]

## 2011-04-16 NOTE — Procedures (Signed)
DUPLEX DEEP VENOUS EXAM - UPPER EXTREMITY   INDICATION:  Left upper extremity DVT.   HISTORY:  Edema:  No.  Trauma/Surgery:  Left shoulder surgery on 08/25/2008.  Pain:  No.  PE:  No.  Previous DVT:  The patient developed a left brachial vein deep venous  thrombosis following shoulder surgery last fall that has been followed  by an outside facility for the last few months.  Anticoagulants:  Yes.  Other:   DUPLEX EXAM:                                             Bas/                IJV   SCV     AXV    BrachV  Ceph V                R  L  R   L   R  L   R   L   R  L  Thrombosis    0  0  0   0      0       +      0  Spontaneous   +  +  +   +      +       0      +  Phasic        +  +  +   +      +       0      +  Augmentation  +  +  +   +      +       0      +  Compressible  +  +  +   +      +       0      +  Competent  Legend:  + - yes  o - no  p - partial  D - decreased   IMPRESSION:  1. 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  2. No evidence of acute deep venous thrombosis is noted in the left      upper extremity.   ___________________________________________  Janetta Hora Fields, MD   CH/MEDQ  D:  01/04/2009  T:  01/04/2009  Job:  [id number]

## 2011-04-19 NOTE — Procedures (Signed)
   NAME:  Tim Day, Tim Day                    ACCOUNT NO.:  [id number]   MEDICAL RECORD NO.:  [id number]                   PATIENT TYPE:  OUT   LOCATION:  [facility]                                 FACILITY:  [facility]   PHYSICIAN:  Edward L. Juanetta Gosling, M.D.             DATE OF BIRTH:  12/05/1950   DATE OF PROCEDURE:  10/01/2002  DATE OF DISCHARGE:                                    STRESS TEST   INDICATIONS FOR PROCEDURE:  The patient is undergoing his graded exercise  test as part of a physical examination.  There are no contraindications to  graded exercise testing.   The patient exercised for 10 minutes and 15 seconds on the Bruce protocol  reaching and sustaining 12.9 METS.  His maximum recorded heart rate was 156,  which is 91% of his age-predicted maximal heart rate.  He had no  electrocardiographic changes suggestive of inducible ischemia.  No symptoms  during exercise, normal blood pressure response to exercise.   IMPRESSION:  1. Good exercise tolerance.  2. No evidence of inducible ischemia.  3. Normal blood pressure response to exercise.  4. No symptoms.  5. Normal graded exercise test.                                                Ramon Dredge L. Juanetta Gosling, M.D.    ELH/MEDQ  D:  10/01/2002  T:  10/01/2002  Job:  [id number]   cc:   Angus G. Renard Matter, M.D.

## 2011-06-14 ENCOUNTER — Other Ambulatory Visit: Payer: Self-pay

## 2011-06-14 DIAGNOSIS — C439 Malignant melanoma of skin, unspecified: Secondary | ICD-10-CM

## 2011-06-14 DIAGNOSIS — C4492 Squamous cell carcinoma of skin, unspecified: Secondary | ICD-10-CM

## 2011-06-14 HISTORY — DX: Malignant melanoma of skin, unspecified: C43.9

## 2011-06-14 HISTORY — DX: Squamous cell carcinoma of skin, unspecified: C44.92

## 2011-09-02 LAB — GLUCOSE, CAPILLARY
Glucose-Capillary: 138 — ABNORMAL HIGH
Glucose-Capillary: 157 — ABNORMAL HIGH
Glucose-Capillary: 266 — ABNORMAL HIGH

## 2011-09-02 LAB — BASIC METABOLIC PANEL
BUN: 12
Calcium: 8.7
Chloride: 106
Creatinine, Ser: 0.99
GFR calc Af Amer: >60
GFR calc non Af Amer: >60
GFR calc non Af Amer: >60
Potassium: 4.3
Sodium: 135
Sodium: 140

## 2011-09-02 LAB — URINALYSIS, ROUTINE W REFLEX MICROSCOPIC
Bilirubin Urine: NEGATIVE
Glucose, UA: NEGATIVE
Ketones, ur: NEGATIVE
Specific Gravity, Urine: 1.02
pH: 6.5

## 2011-09-02 LAB — CBC
HCT: 40.2
Hemoglobin: 13.7
MCV: 86.2
Platelets: 203
WBC: 4.8

## 2011-09-02 LAB — PROTIME-INR: Prothrombin Time: 14.9

## 2011-09-02 LAB — DIFFERENTIAL
Eosinophils Absolute: 0.1
Eosinophils Relative: 3
Lymphocytes Relative: 22
Lymphs Abs: 1.1
Monocytes Absolute: 0.4
Monocytes Relative: 8

## 2011-12-25 ENCOUNTER — Other Ambulatory Visit: Payer: Self-pay | Admitting: Physician Assistant

## 2012-01-21 ENCOUNTER — Telehealth: Payer: Self-pay

## 2012-01-21 NOTE — Telephone Encounter (Signed)
LMOM to call.

## 2012-01-31 NOTE — Telephone Encounter (Signed)
Letter mailed to call.  

## 2012-03-30 ENCOUNTER — Telehealth: Payer: Self-pay

## 2012-03-30 DIAGNOSIS — Z139 Encounter for screening, unspecified: Secondary | ICD-10-CM

## 2012-03-30 NOTE — Telephone Encounter (Signed)
Called and could not leave a message.

## 2012-03-31 NOTE — Telephone Encounter (Signed)
See triage paper. Waiting for call back with med list.

## 2012-04-01 NOTE — Telephone Encounter (Signed)
Gastroenterology Pre-Procedure Form  Pt not scheduled yet. Waiting on June schedule/ needs a mon   Early due to being diabetic  Request Date: 03/31/2012         Requesting Physician: Dr. Dwana Melena     PATIENT INFORMATION:  Tim Day is a 61 y.o., male (DOB=[birth year]).  PROCEDURE: Procedure(s) requested: colonoscopy Procedure Reason: screening for colon cancer  PATIENT REVIEW QUESTIONS: The patient reports the following:   1. Diabetes Melitis: yes  2. Joint replacements in the past 12 months: no 3. Major health problems in the past 3 months: no 4. Has an artificial valve or MVP:no 5. Has been advised in past to take antibiotics in advance of a procedure like teeth cleaning: no}    MEDICATIONS & ALLERGIES:    Patient reports the following regarding taking any blood thinners:   Plavix? no Aspirin?yes  Coumadin?  no  Patient confirms/reports the following medications:  Current Outpatient Prescriptions  Medication Sig Dispense Refill  . aspirin 325 MG tablet Take 325 mg by mouth daily.      Marland Kitchen esomeprazole (NEXIUM) 40 MG capsule Take 40 mg by mouth daily before breakfast.      . metFORMIN (GLUCOPHAGE) 1000 MG tablet Take 1,000 mg by mouth 2 (two) times daily with a meal.      . NON FORMULARY Beta Prostate   Takes two tablets daily      . rosuvastatin (CRESTOR) 5 MG tablet Take 5 mg by mouth daily.      . valsartan (DIOVAN) 160 MG tablet Take 160 mg by mouth daily.        Patient confirms/reports the following allergies:  No Known Allergies  Patient is appropriate to schedule for requested procedure(s): yes  AUTHORIZATION INFORMATION Primary Insurance:   ID #:  Group #:  Pre-Cert / Auth required: Pre-Cert / Auth #:   Secondary Insurance:   ID #:  Group #:  Pre-Cert / Auth required: Pre-Cert / Auth #:   No orders of the defined types were placed in this encounter.    SCHEDULE INFORMATION: Procedure has been scheduled as follows:  Date:             Time:     Location:  This Gastroenterology Pre-Precedure Form is being routed to the following provider(s) for review: R. Roetta Sessions, MD

## 2012-04-02 NOTE — Telephone Encounter (Signed)
Day of prep: metformin 500mg  bid. Ok to schedule.

## 2012-04-08 ENCOUNTER — Other Ambulatory Visit: Payer: Self-pay

## 2012-04-08 DIAGNOSIS — Z139 Encounter for screening, unspecified: Secondary | ICD-10-CM

## 2012-04-08 MED ORDER — PEG 3350-KCL-NA BICARB-NACL 420 G PO SOLR: ORAL | Status: AC

## 2012-04-08 NOTE — Telephone Encounter (Signed)
Rx and instructions mailed to pt. With appt date and time.   I gave him an appointment for 05/25/2012 @ 7:30 AM. He wanted a Monday in June and that is the only Mon that Dr. Jena Gauss will be in Endo in June. I could not reach him by phone.

## 2012-04-20 ENCOUNTER — Emergency Department (HOSPITAL_COMMUNITY)
Admission: EM | Admit: 2012-04-20 | Discharge: 2012-04-20 | Disposition: A | Discharge status: Home / Self Care | Payer: 59 | Attending: Emergency Medicine | Admitting: Emergency Medicine

## 2012-04-20 ENCOUNTER — Emergency Department (HOSPITAL_COMMUNITY): Payer: 59

## 2012-04-20 ENCOUNTER — Encounter (HOSPITAL_COMMUNITY): Payer: Self-pay | Admitting: *Deleted

## 2012-04-20 DIAGNOSIS — M7989 Other specified soft tissue disorders: Secondary | ICD-10-CM | POA: Insufficient documentation

## 2012-04-20 DIAGNOSIS — E119 Type 2 diabetes mellitus without complications: Secondary | ICD-10-CM | POA: Insufficient documentation

## 2012-04-20 DIAGNOSIS — W208XXA Other cause of strike by thrown, projected or falling object, initial encounter: Secondary | ICD-10-CM | POA: Insufficient documentation

## 2012-04-20 DIAGNOSIS — S61219A Laceration without foreign body of unspecified finger without damage to nail, initial encounter: Secondary | ICD-10-CM

## 2012-04-20 DIAGNOSIS — S61209A Unspecified open wound of unspecified finger without damage to nail, initial encounter: Secondary | ICD-10-CM | POA: Insufficient documentation

## 2012-04-20 MED ORDER — BUPIVACAINE HCL (PF) 0.5 % IJ SOLN
INTRAMUSCULAR | Status: AC
  Filled 2012-04-20: qty 30

## 2012-04-20 MED ORDER — TETANUS-DIPHTH-ACELL PERTUSSIS 5-2.5-18.5 LF-MCG/0.5 IM SUSP
0.5000 mL | Freq: Once | INTRAMUSCULAR | Status: AC
  Administered 2012-04-20: 0.5 mL via INTRAMUSCULAR
  Filled 2012-04-20: qty 0.5

## 2012-04-20 MED ORDER — BUPIVACAINE HCL (PF) 0.5 % IJ SOLN
30.0000 mL | Freq: Once | INTRAMUSCULAR | Status: AC
  Administered 2012-04-20: 30 mL
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
## 2012-04-20 NOTE — ED Provider Notes (Signed)
History     CSN: [id number]  Arrival date & time 04/20/12  0704   First MD Initiated Contact with Patient 04/20/12 0813      Chief Complaint  Patient presents with  . Extremity Laceration    (Consider location/radiation/quality/duration/timing/severity/associated sxs/prior treatment) HPI Comments: Pt dropped ~ 60 lb steel concrete form lid on R ring finger.   ? Last dT.  R hand dominant.  The history is provided by the patient. No language interpreter was used.    Past Medical History  Diagnosis Date  . Diabetes mellitus     Past Surgical History  Procedure Date  . Shoulder surgery     No family history on file.  History  Substance Use Topics  . Smoking status: Never Smoker   . Smokeless tobacco: Not on file  . Alcohol Use: No      Review of Systems  Skin: Positive for wound.  Neurological: Negative for weakness and numbness.  All other systems reviewed and are negative.    Allergies  Eggs or egg-derived products  Home Medications   Current Outpatient Rx  Name Route Sig Dispense Refill  . ASPIRIN 325 MG PO TABS Oral Take 325 mg by mouth daily.    Marland Kitchen CINNAMON PO Oral Take 1 tablet by mouth daily.    . OMEGA-3 FATTY ACIDS 1000 MG PO CAPS Oral Take 1 g by mouth daily.    Marland Kitchen METFORMIN HCL 1000 MG PO TABS Oral Take 1,000 mg by mouth 2 (two) times daily with a meal.    . VALSARTAN 160 MG PO TABS Oral Take 160 mg by mouth daily.      BP 142/88  Pulse 84  Temp(Src) 98.1 F (36.7 C) (Oral)  Resp 16  SpO2 95%  Physical Exam  Nursing note and vitals reviewed. Constitutional: He is oriented to person, place, and time. He appears well-developed and well-nourished.  HENT:  Head: Normocephalic and atraumatic.  Eyes: EOM are normal.  Neck: Normal range of motion.  Cardiovascular: Normal rate, regular rhythm, normal heart sounds and intact distal pulses.   Pulmonary/Chest: Effort normal and breath sounds normal. No respiratory distress.  Abdominal: Soft. He  exhibits no distension. There is no tenderness.  Musculoskeletal: Normal range of motion. He exhibits tenderness.       Right hand: He exhibits tenderness, bony tenderness, laceration and swelling. He exhibits normal range of motion, normal capillary refill and no deformity. normal sensation noted. Normal strength noted.       Hands:      transverse laceration to R volar DIP.  Superficialis and profundus function intact.  No active bleeding.  Neurological: He is alert and oriented to person, place, and time.  Skin: Skin is warm and dry.  Psychiatric: He has a normal mood and affect. Judgment normal.    ED Course  LACERATION REPAIR Date/Time: 04/20/2012 9:35 AM Performed by: Worthy Rancher Authorized by: Worthy Rancher Consent: Verbal consent obtained. Written consent not obtained. Risks and benefits: risks, benefits and alternatives were discussed Consent given by: patient Patient understanding: patient states understanding of the procedure being performed Site marked: the operative site was not marked Imaging studies: imaging studies available Required items: required blood products, implants, devices, and special equipment available Patient identity confirmed: verbally with patient Time out: Immediately prior to procedure a "time out" was called to verify the correct patient, procedure, equipment, support staff and site/side marked as required. Laceration length: 2 cm Foreign bodies: no foreign bodies Tendon  involvement: none Nerve involvement: none Vascular damage: no Local anesthetic: bupivacaine 0.5% without epinephrine Anesthetic total: 8 ml Patient sedated: no Preparation: Patient was prepped and draped in the usual sterile fashion. Irrigation solution: saline Irrigation method: syringe Amount of cleaning: extensive Debridement: none Degree of undermining: none Skin closure: 4-0 nylon Number of sutures: 4 Technique: simple Approximation: close Approximation  difficulty: simple Dressing: 4x4 sterile gauze and antibiotic ointment Patient tolerance: Patient tolerated the procedure well with no immediate complications.   (including critical care time)  Labs Reviewed - No data to display Dg Finger Ring Right  04/20/2012  *RADIOLOGY REPORT*  Clinical Data: Extremity laceration  RIGHT RING FINGER 2+V  Comparison: None.  Findings: Soft tissue laceration involving the distal fourth digit at the DIP joint.  No underlying osseous abnormality. No fracture or dislocation is seen.  The joint spaces are preserved.  No radiopaque foreign body is seen.  IMPRESSION: Soft tissue laceration involving the distal fourth digit at the DIP joint.  No fracture, dislocation, or radiopaque foreign body is seen.  Original Report Authenticated By: Charline Bills, M.D.     1. Finger laceration       MDM  Simple lac.  No underlying fx.  Suture removal in 8-10 days.        Worthy Rancher, PA 04/20/12 1000  Worthy Rancher, PA 04/20/12 1001

## 2012-04-20 NOTE — Discharge Instructions (Signed)
Sutured Wound Care Sutures are stitches that can be used to close wounds. Wound care helps prevent pain and infection.  HOME CARE INSTRUCTIONS   Rest and elevate the injured area until all the pain and swelling are gone.   Only take over-the-counter or prescription medicines for pain, discomfort, or fever as directed by your caregiver.   After 48 hours, gently wash the area with mild soap and water once a day, or as directed. Rinse off the soap. Pat the area dry with a clean towel. Do not rub the wound. This may cause bleeding.   Follow your caregiver's instructions for how often to change the bandage (dressing). Stop using a dressing after 2 days or after the wound stops draining.   If the dressing sticks, moisten it with soapy water and gently remove it.   Apply ointment on the wound as directed.   Avoid stretching a sutured wound.   Drink enough fluids to keep your urine clear or pale yellow.   Follow up with your caregiver for suture removal as directed.   Use sunscreen on your wound for the next 3 to 6 months so the scar will not darken.  SEEK IMMEDIATE MEDICAL CARE IF:   Your wound becomes red, swollen, hot, or tender.   You have increasing pain in the wound.   You have a red streak that extends from the wound.   There is pus coming from the wound.   You have a fever.   You have shaking chills.   There is a bad smell coming from the wound.   You have persistent bleeding from the wound.  MAKE SURE YOU:   Understand these instructions.   Will watch your condition.   Will get help right away if you are not doing well or get worse.  Document Released: 12/26/2004 Document Revised: 11/07/2011 Document Reviewed: 03/24/2011 [facility] Patient Information 2012 [place], [place].   Wash wound twice daiy with soap and water then apply antibiotic ointment and dressing.  Suture removal in 8-10 days.

## 2012-04-22 NOTE — ED Provider Notes (Signed)
Medical screening examination/treatment/procedure(s) were performed by non-physician practitioner and as supervising physician I was immediately available for consultation/collaboration.  Maisa Bedingfield, MD 04/22/12 0908 

## 2012-05-12 ENCOUNTER — Telehealth: Payer: Self-pay

## 2012-05-12 NOTE — Telephone Encounter (Signed)
Day of prep: metformin 500mg  bid.

## 2012-05-12 NOTE — Telephone Encounter (Signed)
Called pt to update triage. No new medical problems and no new medications.  

## 2012-05-12 NOTE — Telephone Encounter (Signed)
Pt aware.

## 2012-05-18 ENCOUNTER — Encounter (HOSPITAL_COMMUNITY): Payer: Self-pay | Admitting: Pharmacy Technician

## 2012-05-21 ENCOUNTER — Telehealth: Payer: Self-pay | Admitting: Internal Medicine

## 2012-05-21 NOTE — Telephone Encounter (Signed)
[name] is in the waiting room. She wants to [facility] patient's colonoscopy that's scheduled for Monday. Also, has questions about his Metformin.

## 2012-05-21 NOTE — Progress Notes (Signed)
[name] Kathie Rhodes came by the office to cx appt for colonoscopy on 05/25/2012. They have a lot going on now. She wants to reschedule for early Aug. I told her I will put him on the recall list and call in July for early Aug. [facility] for Kim that pt has cancelled. Kathie Rhodes said to call her at [phone] to schedule next appt.

## 2012-05-21 NOTE — Telephone Encounter (Signed)
Spoke to pt's wife. Pt wants Aug appt. On recall list. Selena Batten is aware of the cancellation.

## 2012-05-25 ENCOUNTER — Ambulatory Visit (HOSPITAL_COMMUNITY): Admission: RE | Admit: 2012-05-25 | Payer: 59 | Source: Ambulatory Visit | Admitting: Internal Medicine

## 2012-05-25 ENCOUNTER — Encounter (HOSPITAL_COMMUNITY): Admission: RE | Payer: Self-pay | Source: Ambulatory Visit

## 2012-05-25 SURGERY — COLONOSCOPY
Anesthesia: Moderate Sedation

## 2012-06-18 ENCOUNTER — Other Ambulatory Visit: Payer: Self-pay

## 2012-06-18 ENCOUNTER — Telehealth: Payer: Self-pay

## 2012-06-18 DIAGNOSIS — Z139 Encounter for screening, unspecified: Secondary | ICD-10-CM

## 2012-06-18 NOTE — Telephone Encounter (Signed)
Day of prep: metformin 500mg  bid. With regards to using same prep, we need to call pharmacist and see if ok. I'm not sure if you can store unmixed prep in fridge???? If there is any doubt, I would prefer to see if we can get patient a free sample to help out.

## 2012-06-18 NOTE — Telephone Encounter (Signed)
Per Soledad Gerlach, Endo said time will be 8:30 Am instead of 8:15 AM.

## 2012-06-18 NOTE — Telephone Encounter (Addendum)
Gastroenterology Pre-Procedure Form   Pt still has prescription from previous cancelled appt. Has kept in the refrigerator but did not open and mix and date is still good.  Wants to use it and not have to buy a new prescription. Please advise!   Request Date: 06/18/2012       Requesting Physician: Dr. Dwana Melena     PATIENT INFORMATION:  Tim Day is a 61 y.o., male (DOB=[birth year]).  PROCEDURE: Procedure(s) requested: colonoscopy Procedure Reason: screening for colon cancer  PATIENT REVIEW QUESTIONS: The patient reports the following:   1. Diabetes Melitis: yes  2. Joint replacements in the past 12 months: no 3. Major health problems in the past 3 months: no 4. Has an artificial valve or MVP:no 5. Has been advised in past to take antibiotics in advance of a procedure like teeth cleaning: no}    MEDICATIONS & ALLERGIES:    Patient reports the following regarding taking any blood thinners:   Plavix? no Aspirin?yes  Coumadin?  no  Patient confirms/reports the following medications:  Current Outpatient Prescriptions  Medication Sig Dispense Refill  . aspirin 325 MG tablet Take 325 mg by mouth daily.      Marland Kitchen CINNAMON PO Take 1 tablet by mouth 2 (two) times daily.       . fish oil-omega-3 fatty acids 1000 MG capsule Take 1 g by mouth daily.      . Liraglutide (VICTOZA) 18 MG/3ML SOLN Inject into the skin. Pt takes 1.8 mg  Daily in the pm      . metFORMIN (GLUCOPHAGE) 1000 MG tablet Take 1,000 mg by mouth 2 (two) times daily with a meal.      . valsartan (DIOVAN) 160 MG tablet Take 320 mg by mouth daily.         Patient confirms/reports the following allergies:  Allergies  Allergen Reactions  . Eggs Or Egg-Derived Products Hives    Patient is appropriate to schedule for requested procedure(s): yes  AUTHORIZATION INFORMATION Primary Insurance:   ID #:   Group #:  Pre-Cert / Auth required Pre-Cert / Auth #:   Secondary Insurance:   ID #:   Group #:  Pre-Cert /  Auth required:  Pre-Cert / Auth #:   No orders of the defined types were placed in this encounter.    SCHEDULE INFORMATION: Procedure has been scheduled as follows:  Date: 07/13/2012      Time: 8:15 AM  Location: [facility] Short Stay  This Gastroenterology Pre-Precedure Form is being routed to the following provider(s) for review: R. Roetta Sessions, MD

## 2012-06-19 NOTE — Telephone Encounter (Signed)
New instructions mailed to pt. 

## 2012-06-19 NOTE — Telephone Encounter (Signed)
I called Tim Day at [facility], he said the prep should be fine as long as it was not opened and mixed.

## 2012-06-29 ENCOUNTER — Encounter (HOSPITAL_COMMUNITY): Payer: Self-pay | Admitting: Pharmacy Technician

## 2012-07-13 ENCOUNTER — Encounter (HOSPITAL_COMMUNITY): Payer: Self-pay | Admitting: *Deleted

## 2012-07-13 ENCOUNTER — Encounter (HOSPITAL_COMMUNITY)
Admission: RE | Disposition: A | Discharge status: Home / Self Care | Payer: Self-pay | Source: Ambulatory Visit | Attending: Internal Medicine

## 2012-07-13 ENCOUNTER — Ambulatory Visit (HOSPITAL_COMMUNITY)
Admission: RE | Admit: 2012-07-13 | Discharge: 2012-07-13 | Disposition: A | Discharge status: Home / Self Care | Payer: 59 | Source: Ambulatory Visit | Attending: Internal Medicine | Admitting: Internal Medicine

## 2012-07-13 DIAGNOSIS — D126 Benign neoplasm of colon, unspecified: Secondary | ICD-10-CM | POA: Insufficient documentation

## 2012-07-13 DIAGNOSIS — Z79899 Other long term (current) drug therapy: Secondary | ICD-10-CM | POA: Insufficient documentation

## 2012-07-13 DIAGNOSIS — I10 Essential (primary) hypertension: Secondary | ICD-10-CM | POA: Insufficient documentation

## 2012-07-13 DIAGNOSIS — E7800 Pure hypercholesterolemia, unspecified: Secondary | ICD-10-CM | POA: Insufficient documentation

## 2012-07-13 DIAGNOSIS — Z139 Encounter for screening, unspecified: Secondary | ICD-10-CM

## 2012-07-13 DIAGNOSIS — Z1211 Encounter for screening for malignant neoplasm of colon: Secondary | ICD-10-CM

## 2012-07-13 DIAGNOSIS — Z01812 Encounter for preprocedural laboratory examination: Secondary | ICD-10-CM | POA: Insufficient documentation

## 2012-07-13 DIAGNOSIS — E119 Type 2 diabetes mellitus without complications: Secondary | ICD-10-CM | POA: Insufficient documentation

## 2012-07-13 HISTORY — PX: COLONOSCOPY: SHX5424

## 2012-07-13 HISTORY — DX: Essential (primary) hypertension: I10

## 2012-07-13 SURGERY — COLONOSCOPY
Anesthesia: Moderate Sedation

## 2012-07-13 MED ORDER — MEPERIDINE HCL 100 MG/ML IJ SOLN
INTRAMUSCULAR | Status: AC
  Filled 2012-07-13: qty 2

## 2012-07-13 MED ORDER — MIDAZOLAM HCL 5 MG/5ML IJ SOLN
INTRAMUSCULAR | Status: DC | PRN
  Administered 2012-07-13 (×2): 2 mg via INTRAVENOUS

## 2012-07-13 MED ORDER — MEPERIDINE HCL 100 MG/ML IJ SOLN
INTRAMUSCULAR | Status: DC | PRN
  Administered 2012-07-13: 50 mg via INTRAVENOUS
  Administered 2012-07-13: 25 mg via INTRAVENOUS

## 2012-07-13 MED ORDER — MIDAZOLAM HCL 5 MG/5ML IJ SOLN
INTRAMUSCULAR | Status: AC
  Filled 2012-07-13: qty 10

## 2012-07-13 MED ORDER — SODIUM CHLORIDE 0.45 % IV SOLN
Freq: Once | INTRAVENOUS | Status: AC
  Administered 2012-07-13: 20 mL/h via INTRAVENOUS

## 2012-07-13 MED ORDER — STERILE WATER FOR IRRIGATION IR SOLN
Status: DC | PRN
  Administered 2012-07-13: 09:00:00

## 2012-07-13 NOTE — Op Note (Signed)
[facility] [address] [place], [place]  [address]  COLONOSCOPY PROCEDURE REPORT  PATIENT:  Tim Day, Tim Day  MR#:  [id number] BIRTHDATE:  26-Nov-1951, 60 yrs. old  GENDER:  male ENDOSCOPIST:  R. Roetta Sessions, MD FACP [facility] REF. BY:  Catalina Pizza, M.D. PROCEDURE DATE:  07/13/2012 PROCEDURE:  Colonoscopy with snare polypectomy  INDICATIONS:  First ever average risk screening examination.  INFORMED CONSENT:  The risks, benefits, alternatives and imponderables including but not limited to bleeding, perforation as well as the possibility of a missed lesion have been reviewed. The potential for biopsy, lesion removal, etc. have also been discussed.  Questions have been answered.  All parties agreeable. Please see the history and physical in the medical record for more information.  MEDICATIONS:  Versed 4 mg IV and Demerol 75 mg IV in divided doses.  DESCRIPTION OF PROCEDURE:  After a digital rectal exam was performed, the EC-[id number] ([id number]) colonoscope was advanced from the anus through the rectum and colon to the area of the cecum, ileocecal valve and appendiceal orifice.  The cecum was deeply intubated.  These structures were well-seen and photographed for the record.  From the level of the cecum and ileocecal valve, the scope was slowly and cautiously withdrawn.  The mucosal surfaces were carefully surveyed utilizing scope tip deflection to facilitate fold flattening as needed.  The scope was pulled down into the rectum where a thorough examination including retroflexion was performed. <<PROCEDUREIMAGES>>  FINDINGS: Adequate preparation. Normal rectum. Single 5 mm pedunculated polyp  in the mid ascending segment; otherwise, the remainder of the          colonic mucosa appeared normal.  THERAPEUTIC / DIAGNOSTIC MANEUVERS PERFORMED:  The above-mentioned polyp was cold was snare removed  COMPLICATIONS:  None  CECAL WITHDRAWAL TIME: 11 minutes  IMPRESSION:   Colonic polyp-removed as described above  RECOMMENDATIONS:  Further recommendations to follow pending review of pathology report.  ______________________________ R. Roetta Sessions, MD Caleen Essex  CC:  Catalina Pizza, M.D.  n. eSIGNED:   R. Roetta Sessions at 07/13/2012 09:22 AM  Michelene Heady, [phone]
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
## 2012-07-15 ENCOUNTER — Encounter: Payer: Self-pay | Admitting: *Deleted

## 2012-07-15 ENCOUNTER — Encounter: Payer: Self-pay | Admitting: Internal Medicine

## 2012-07-16 ENCOUNTER — Encounter (HOSPITAL_COMMUNITY): Payer: Self-pay | Admitting: Internal Medicine

## 2012-10-17 ENCOUNTER — Encounter (HOSPITAL_COMMUNITY): Payer: Self-pay | Admitting: Emergency Medicine

## 2012-10-17 ENCOUNTER — Emergency Department (HOSPITAL_COMMUNITY)
Admission: EM | Admit: 2012-10-17 | Discharge: 2012-10-17 | Disposition: A | Discharge status: Home / Self Care | Payer: 59 | Attending: Emergency Medicine | Admitting: Emergency Medicine

## 2012-10-17 DIAGNOSIS — I10 Essential (primary) hypertension: Secondary | ICD-10-CM | POA: Insufficient documentation

## 2012-10-17 DIAGNOSIS — Z7982 Long term (current) use of aspirin: Secondary | ICD-10-CM | POA: Insufficient documentation

## 2012-10-17 DIAGNOSIS — L272 Dermatitis due to ingested food: Secondary | ICD-10-CM | POA: Insufficient documentation

## 2012-10-17 DIAGNOSIS — T7840XA Allergy, unspecified, initial encounter: Secondary | ICD-10-CM

## 2012-10-17 DIAGNOSIS — E119 Type 2 diabetes mellitus without complications: Secondary | ICD-10-CM | POA: Insufficient documentation

## 2012-10-17 DIAGNOSIS — E7800 Pure hypercholesterolemia, unspecified: Secondary | ICD-10-CM | POA: Insufficient documentation

## 2012-10-17 DIAGNOSIS — R21 Rash and other nonspecific skin eruption: Secondary | ICD-10-CM | POA: Insufficient documentation

## 2012-10-17 DIAGNOSIS — Z79899 Other long term (current) drug therapy: Secondary | ICD-10-CM | POA: Insufficient documentation

## 2012-10-17 MED ORDER — DIPHENHYDRAMINE HCL 50 MG/ML IJ SOLN
25.0000 mg | Freq: Once | INTRAMUSCULAR | Status: AC
  Administered 2012-10-17: 25 mg via INTRAVENOUS
  Filled 2012-10-17: qty 1

## 2012-10-17 MED ORDER — EPINEPHRINE 0.3 MG/0.3ML IJ DEVI: 0.3000 mg | INTRAMUSCULAR | Status: DC | PRN

## 2012-10-17 MED ORDER — FAMOTIDINE IN NACL 20-0.9 MG/50ML-% IV SOLN
20.0000 mg | Freq: Once | INTRAVENOUS | Status: AC
  Administered 2012-10-17: 20 mg via INTRAVENOUS
  Filled 2012-10-17: qty 50

## 2012-10-17 MED ORDER — METHYLPREDNISOLONE SODIUM SUCC 125 MG IJ SOLR
125.0000 mg | Freq: Once | INTRAMUSCULAR | Status: AC
  Administered 2012-10-17: 125 mg via INTRAVENOUS
  Filled 2012-10-17: qty 2

## 2012-10-17 MED ORDER — PREDNISONE 10 MG PO TABS: 20.0000 mg | ORAL_TABLET | Freq: Every day | ORAL | Status: DC

## 2012-10-17 NOTE — ED Notes (Signed)
Pt woke up having an allergic reaction. Pt has raised bumps on trunk and arms. Pt also feels like his lips are swelling.
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
## 2012-12-30 ENCOUNTER — Other Ambulatory Visit: Payer: Self-pay | Admitting: Physician Assistant

## 2012-12-30 DIAGNOSIS — D229 Melanocytic nevi, unspecified: Secondary | ICD-10-CM

## 2012-12-30 HISTORY — DX: Melanocytic nevi, unspecified: D22.9

## 2013-02-04 ENCOUNTER — Other Ambulatory Visit: Payer: Self-pay | Admitting: Physician Assistant

## 2013-02-25 ENCOUNTER — Emergency Department (HOSPITAL_COMMUNITY)
Admission: EM | Admit: 2013-02-25 | Discharge: 2013-02-26 | Disposition: A | Discharge status: Home / Self Care | Payer: 59 | Attending: Emergency Medicine | Admitting: Emergency Medicine

## 2013-02-25 ENCOUNTER — Encounter (HOSPITAL_COMMUNITY): Payer: Self-pay

## 2013-02-25 DIAGNOSIS — H5789 Other specified disorders of eye and adnexa: Secondary | ICD-10-CM | POA: Insufficient documentation

## 2013-02-25 DIAGNOSIS — E119 Type 2 diabetes mellitus without complications: Secondary | ICD-10-CM | POA: Insufficient documentation

## 2013-02-25 DIAGNOSIS — Y9389 Activity, other specified: Secondary | ICD-10-CM | POA: Insufficient documentation

## 2013-02-25 DIAGNOSIS — R221 Localized swelling, mass and lump, neck: Secondary | ICD-10-CM | POA: Insufficient documentation

## 2013-02-25 DIAGNOSIS — R42 Dizziness and giddiness: Secondary | ICD-10-CM | POA: Insufficient documentation

## 2013-02-25 DIAGNOSIS — T628X1A Toxic effect of other specified noxious substances eaten as food, accidental (unintentional), initial encounter: Secondary | ICD-10-CM | POA: Insufficient documentation

## 2013-02-25 DIAGNOSIS — E7800 Pure hypercholesterolemia, unspecified: Secondary | ICD-10-CM | POA: Insufficient documentation

## 2013-02-25 DIAGNOSIS — R002 Palpitations: Secondary | ICD-10-CM | POA: Insufficient documentation

## 2013-02-25 DIAGNOSIS — IMO0002 Reserved for concepts with insufficient information to code with codable children: Secondary | ICD-10-CM | POA: Insufficient documentation

## 2013-02-25 DIAGNOSIS — R220 Localized swelling, mass and lump, head: Secondary | ICD-10-CM | POA: Insufficient documentation

## 2013-02-25 DIAGNOSIS — I10 Essential (primary) hypertension: Secondary | ICD-10-CM | POA: Insufficient documentation

## 2013-02-25 DIAGNOSIS — Y929 Unspecified place or not applicable: Secondary | ICD-10-CM | POA: Insufficient documentation

## 2013-02-25 DIAGNOSIS — Z79899 Other long term (current) drug therapy: Secondary | ICD-10-CM | POA: Insufficient documentation

## 2013-02-25 DIAGNOSIS — R232 Flushing: Secondary | ICD-10-CM | POA: Insufficient documentation

## 2013-02-25 DIAGNOSIS — Z7982 Long term (current) use of aspirin: Secondary | ICD-10-CM | POA: Insufficient documentation

## 2013-02-25 DIAGNOSIS — Z8781 Personal history of (healed) traumatic fracture: Secondary | ICD-10-CM | POA: Insufficient documentation

## 2013-02-25 DIAGNOSIS — R197 Diarrhea, unspecified: Secondary | ICD-10-CM | POA: Insufficient documentation

## 2013-02-25 DIAGNOSIS — L299 Pruritus, unspecified: Secondary | ICD-10-CM | POA: Insufficient documentation

## 2013-02-25 DIAGNOSIS — L272 Dermatitis due to ingested food: Secondary | ICD-10-CM | POA: Insufficient documentation

## 2013-02-25 MED ORDER — DIPHENHYDRAMINE HCL 50 MG/ML IJ SOLN
25.0000 mg | Freq: Once | INTRAMUSCULAR | Status: AC
Start: 2013-02-25 — End: 2013-02-25
  Administered 2013-02-25: 25 mg via INTRAVENOUS
  Filled 2013-02-25: qty 1

## 2013-02-25 MED ORDER — FAMOTIDINE IN NACL 20-0.9 MG/50ML-% IV SOLN
20.0000 mg | Freq: Once | INTRAVENOUS | Status: AC
  Administered 2013-02-25: 20 mg via INTRAVENOUS
  Filled 2013-02-25: qty 50

## 2013-02-25 MED ORDER — METHYLPREDNISOLONE SODIUM SUCC 125 MG IJ SOLR
125.0000 mg | Freq: Once | INTRAMUSCULAR | Status: AC
  Administered 2013-02-25: 125 mg via INTRAVENOUS
  Filled 2013-02-25: qty 2
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
## 2013-02-25 NOTE — ED Notes (Signed)
Pt states he ate some salmon approx 7 pm tonight, states approx 1 hour ago started itching and his lips feel swollen.  Pt denies breathing diff.

## 2013-02-26 MED ORDER — PREDNISONE 10 MG PO TABS: 20.0000 mg | ORAL_TABLET | Freq: Every day | ORAL | Status: DC

## 2013-02-26 MED ORDER — RANITIDINE HCL 150 MG PO TABS: 150.0000 mg | ORAL_TABLET | Freq: Two times a day (BID) | ORAL | Status: DC

## 2013-02-26 MED ORDER — DIPHENHYDRAMINE HCL 25 MG PO TABS: 25.0000 mg | ORAL_TABLET | Freq: Four times a day (QID) | ORAL | Status: DC

## 2013-02-26 NOTE — ED Provider Notes (Signed)
Medical screening examination/treatment/procedure(s) were performed by non-physician practitioner and as supervising physician I was immediately available for consultation/collaboration.  Shayana Hornstein S. Opha Mcghee, MD 02/26/13 0143 

## 2014-12-13 ENCOUNTER — Other Ambulatory Visit: Payer: Self-pay | Admitting: Physician Assistant

## 2015-01-20 ENCOUNTER — Encounter (HOSPITAL_COMMUNITY): Payer: Self-pay | Admitting: Emergency Medicine

## 2015-01-20 ENCOUNTER — Inpatient Hospital Stay (HOSPITAL_COMMUNITY): Payer: 59

## 2015-01-20 ENCOUNTER — Encounter (HOSPITAL_COMMUNITY)
Admission: EM | Disposition: A | Discharge status: Home / Self Care | Payer: 59 | Source: Home / Self Care | Attending: Internal Medicine

## 2015-01-20 ENCOUNTER — Inpatient Hospital Stay (HOSPITAL_COMMUNITY)
Admission: EM | Admit: 2015-01-20 | Discharge: 2015-01-22 | DRG: 286 | Disposition: A | Discharge status: Home / Self Care | Payer: 59 | Attending: Internal Medicine | Admitting: Internal Medicine

## 2015-01-20 DIAGNOSIS — E119 Type 2 diabetes mellitus without complications: Secondary | ICD-10-CM | POA: Diagnosis present

## 2015-01-20 DIAGNOSIS — Z7982 Long term (current) use of aspirin: Secondary | ICD-10-CM | POA: Diagnosis not present

## 2015-01-20 DIAGNOSIS — I959 Hypotension, unspecified: Secondary | ICD-10-CM | POA: Diagnosis present

## 2015-01-20 DIAGNOSIS — E7800 Pure hypercholesterolemia, unspecified: Secondary | ICD-10-CM | POA: Diagnosis present

## 2015-01-20 DIAGNOSIS — I249 Acute ischemic heart disease, unspecified: Secondary | ICD-10-CM | POA: Diagnosis present

## 2015-01-20 DIAGNOSIS — I10 Essential (primary) hypertension: Secondary | ICD-10-CM | POA: Diagnosis present

## 2015-01-20 DIAGNOSIS — E785 Hyperlipidemia, unspecified: Secondary | ICD-10-CM | POA: Diagnosis present

## 2015-01-20 DIAGNOSIS — Z7952 Long term (current) use of systemic steroids: Secondary | ICD-10-CM | POA: Diagnosis not present

## 2015-01-20 DIAGNOSIS — I200 Unstable angina: Secondary | ICD-10-CM

## 2015-01-20 DIAGNOSIS — E11351 Type 2 diabetes mellitus with proliferative diabetic retinopathy with macular edema: Secondary | ICD-10-CM

## 2015-01-20 DIAGNOSIS — R570 Cardiogenic shock: Secondary | ICD-10-CM | POA: Diagnosis present

## 2015-01-20 DIAGNOSIS — E1169 Type 2 diabetes mellitus with other specified complication: Secondary | ICD-10-CM

## 2015-01-20 DIAGNOSIS — Z9689 Presence of other specified functional implants: Secondary | ICD-10-CM

## 2015-01-20 DIAGNOSIS — N179 Acute kidney failure, unspecified: Secondary | ICD-10-CM | POA: Diagnosis present

## 2015-01-20 DIAGNOSIS — Z91012 Allergy to eggs: Secondary | ICD-10-CM

## 2015-01-20 DIAGNOSIS — R079 Chest pain, unspecified: Secondary | ICD-10-CM | POA: Diagnosis present

## 2015-01-20 DIAGNOSIS — R001 Bradycardia, unspecified: Secondary | ICD-10-CM | POA: Diagnosis present

## 2015-01-20 DIAGNOSIS — D696 Thrombocytopenia, unspecified: Secondary | ICD-10-CM | POA: Diagnosis present

## 2015-01-20 DIAGNOSIS — R1013 Epigastric pain: Secondary | ICD-10-CM

## 2015-01-20 HISTORY — PX: LEFT HEART CATHETERIZATION WITH CORONARY ANGIOGRAM: SHX5451

## 2015-01-20 LAB — PROTIME-INR
INR: 1.14 (ref 0.00–1.49)
Prothrombin Time: 14.8 seconds (ref 11.6–15.2)

## 2015-01-20 LAB — CBC WITH DIFFERENTIAL/PLATELET
Basophils Absolute: 0 10*3/uL (ref 0.0–0.1)
Basophils Relative: 0 % (ref 0–1)
EOS PCT: 1 % (ref 0–5)
Eosinophils Absolute: 0.1 10*3/uL (ref 0.0–0.7)
HEMATOCRIT: 46.6 % (ref 39.0–52.0)
HEMOGLOBIN: 16.1 g/dL (ref 13.0–17.0)
LYMPHS ABS: 3.3 10*3/uL (ref 0.7–4.0)
LYMPHS PCT: 32 % (ref 12–46)
MCH: 29.5 pg (ref 26.0–34.0)
MCHC: 34.5 g/dL (ref 30.0–36.0)
MCV: 85.3 fL (ref 78.0–100.0)
MONO ABS: 0.4 10*3/uL (ref 0.1–1.0)
Monocytes Relative: 4 % (ref 3–12)
NEUTROS ABS: 6.6 10*3/uL (ref 1.7–7.7)
Neutrophils Relative %: 63 % (ref 43–77)
Platelets: 213 10*3/uL (ref 150–400)
RBC: 5.46 MIL/uL (ref 4.22–5.81)
RDW: 13.2 % (ref 11.5–15.5)
WBC: 10.5 10*3/uL (ref 4.0–10.5)

## 2015-01-20 LAB — COMPREHENSIVE METABOLIC PANEL
ALT: 26 U/L (ref 0–53)
ANION GAP: 10 (ref 5–15)
AST: 25 U/L (ref 0–37)
Albumin: 4.3 g/dL (ref 3.5–5.2)
Alkaline Phosphatase: 44 U/L (ref 39–117)
BILIRUBIN TOTAL: 1 mg/dL (ref 0.3–1.2)
BUN: 34 mg/dL — AB (ref 6–23)
CALCIUM: 9.4 mg/dL (ref 8.4–10.5)
CHLORIDE: 103 mmol/L (ref 96–112)
CO2: 22 mmol/L (ref 19–32)
CREATININE: 1.49 mg/dL — AB (ref 0.50–1.35)
GFR calc Af Amer: 57 mL/min — ABNORMAL LOW (ref >90)
GFR, EST NON AFRICAN AMERICAN: 49 mL/min — AB (ref >90)
Glucose, Bld: 263 mg/dL — ABNORMAL HIGH (ref 70–99)
Potassium: 4.8 mmol/L (ref 3.5–5.1)
Sodium: 135 mmol/L (ref 135–145)
Total Protein: 6.9 g/dL (ref 6.0–8.3)

## 2015-01-20 LAB — I-STAT TROPONIN, ED: TROPONIN I, POC: 0 ng/mL (ref 0.00–0.08)

## 2015-01-20 LAB — GLUCOSE, CAPILLARY
GLUCOSE-CAPILLARY: 152 mg/dL — AB (ref 70–99)
Glucose-Capillary: 110 mg/dL — ABNORMAL HIGH (ref 70–99)

## 2015-01-20 LAB — CBC
HCT: 41.3 % (ref 39.0–52.0)
HEMOGLOBIN: 14.2 g/dL (ref 13.0–17.0)
MCH: 29.1 pg (ref 26.0–34.0)
MCHC: 34.4 g/dL (ref 30.0–36.0)
MCV: 84.6 fL (ref 78.0–100.0)
PLATELETS: 129 10*3/uL — AB (ref 150–400)
RBC: 4.88 MIL/uL (ref 4.22–5.81)
RDW: 13.3 % (ref 11.5–15.5)
WBC: 7.8 10*3/uL (ref 4.0–10.5)

## 2015-01-20 LAB — CREATININE, SERUM
CREATININE: 1.23 mg/dL (ref 0.50–1.35)
GFR calc Af Amer: 72 mL/min — ABNORMAL LOW (ref >90)
GFR, EST NON AFRICAN AMERICAN: 62 mL/min — AB (ref >90)

## 2015-01-20 LAB — LIPASE, BLOOD: LIPASE: 74 U/L — AB (ref 11–59)

## 2015-01-20 LAB — TROPONIN I: Troponin I: <0.03 ng/mL (ref <0.031)

## 2015-01-20 LAB — CBG MONITORING, ED: GLUCOSE-CAPILLARY: 262 mg/dL — AB (ref 70–99)

## 2015-01-20 LAB — MRSA PCR SCREENING: MRSA by PCR: NEGATIVE

## 2015-01-20 SURGERY — LEFT HEART CATHETERIZATION WITH CORONARY ANGIOGRAM

## 2015-01-20 MED ORDER — SODIUM CHLORIDE 0.9 % IJ SOLN
3.0000 mL | INTRAMUSCULAR | Status: DC | PRN
  Administered 2015-01-21: 3 mL via INTRAVENOUS
  Filled 2015-01-20: qty 3

## 2015-01-20 MED ORDER — SODIUM CHLORIDE 0.9 % IV SOLN
INTRAVENOUS | Status: AC
  Administered 2015-01-20: 12:00:00 via INTRAVENOUS

## 2015-01-20 MED ORDER — ENOXAPARIN SODIUM 40 MG/0.4ML ~~LOC~~ SOLN
40.0000 mg | SUBCUTANEOUS | Status: DC
  Administered 2015-01-21 – 2015-01-22 (×2): 40 mg via SUBCUTANEOUS
  Filled 2015-01-20 (×3): qty 0.4

## 2015-01-20 MED ORDER — SODIUM CHLORIDE 0.9 % IV SOLN: 250.0000 mL | INTRAVENOUS | Status: DC | PRN

## 2015-01-20 MED ORDER — HEPARIN (PORCINE) IN NACL 2-0.9 UNIT/ML-% IJ SOLN
INTRAMUSCULAR | Status: AC
  Filled 2015-01-20: qty 1000

## 2015-01-20 MED ORDER — FENTANYL CITRATE 0.05 MG/ML IJ SOLN
50.0000 ug | Freq: Once | INTRAMUSCULAR | Status: AC
  Administered 2015-01-20: 50 ug via INTRAVENOUS
  Filled 2015-01-20: qty 2

## 2015-01-20 MED ORDER — DOPAMINE-DEXTROSE 3.2-5 MG/ML-% IV SOLN: 0.0000 ug/kg/min | INTRAVENOUS | Status: DC

## 2015-01-20 MED ORDER — ALUM & MAG HYDROXIDE-SIMETH 200-200-20 MG/5ML PO SUSP
30.0000 mL | Freq: Four times a day (QID) | ORAL | Status: DC | PRN
  Administered 2015-01-20: 30 mL via ORAL
  Filled 2015-01-20: qty 30

## 2015-01-20 MED ORDER — ACETAMINOPHEN 325 MG PO TABS: 650.0000 mg | ORAL_TABLET | ORAL | Status: DC | PRN

## 2015-01-20 MED ORDER — SODIUM CHLORIDE 0.9 % IJ SOLN: 3.0000 mL | Freq: Two times a day (BID) | INTRAMUSCULAR | Status: DC

## 2015-01-20 MED ORDER — HEPARIN SODIUM (PORCINE) 1000 UNIT/ML IJ SOLN
INTRAMUSCULAR | Status: AC
Start: 2015-01-20 — End: 2015-01-20
  Filled 2015-01-20: qty 1

## 2015-01-20 MED ORDER — MIDAZOLAM HCL 2 MG/2ML IJ SOLN
INTRAMUSCULAR | Status: AC
  Filled 2015-01-20: qty 2

## 2015-01-20 MED ORDER — LIDOCAINE HCL (PF) 1 % IJ SOLN
INTRAMUSCULAR | Status: AC
  Filled 2015-01-20: qty 30

## 2015-01-20 MED ORDER — PANTOPRAZOLE SODIUM 40 MG PO TBEC
40.0000 mg | DELAYED_RELEASE_TABLET | Freq: Every day | ORAL | Status: DC
  Administered 2015-01-20 – 2015-01-22 (×3): 40 mg via ORAL
  Filled 2015-01-20 (×3): qty 1

## 2015-01-20 MED ORDER — ONDANSETRON HCL 4 MG/2ML IJ SOLN: 4.0000 mg | Freq: Four times a day (QID) | INTRAMUSCULAR | Status: DC | PRN

## 2015-01-20 MED ORDER — ASPIRIN 325 MG PO TABS
ORAL_TABLET | ORAL | Status: AC
Start: 2015-01-20 — End: 2015-01-20
  Filled 2015-01-20: qty 1

## 2015-01-20 MED ORDER — SODIUM CHLORIDE 0.9 % IJ SOLN
3.0000 mL | Freq: Two times a day (BID) | INTRAMUSCULAR | Status: DC
  Administered 2015-01-21 (×2): 3 mL via INTRAVENOUS

## 2015-01-20 MED ORDER — METFORMIN HCL 500 MG PO TABS: 1000.0000 mg | ORAL_TABLET | Freq: Two times a day (BID) | ORAL | Status: DC

## 2015-01-20 MED ORDER — SODIUM CHLORIDE 0.9 % IV SOLN
1.0000 mL/kg/h | INTRAVENOUS | Status: AC
  Administered 2015-01-20: 1 mL/kg/h via INTRAVENOUS

## 2015-01-20 MED ORDER — DOPAMINE-DEXTROSE 3.2-5 MG/ML-% IV SOLN
INTRAVENOUS | Status: AC
  Administered 2015-01-20: 10 ug/kg/min
  Filled 2015-01-20: qty 250

## 2015-01-20 MED ORDER — ONDANSETRON HCL 4 MG/2ML IJ SOLN
INTRAMUSCULAR | Status: AC
  Administered 2015-01-20: 4 mg via INTRAVENOUS
  Filled 2015-01-20: qty 2

## 2015-01-20 MED ORDER — MORPHINE SULFATE 2 MG/ML IJ SOLN: 1.0000 mg | INTRAMUSCULAR | Status: DC | PRN

## 2015-01-20 MED ORDER — SODIUM CHLORIDE 0.9 % IV BOLUS (SEPSIS)
1000.0000 mL | Freq: Once | INTRAVENOUS | Status: AC
  Administered 2015-01-20: 1000 mL via INTRAVENOUS

## 2015-01-20 MED ORDER — HEPARIN (PORCINE) IN NACL 100-0.45 UNIT/ML-% IJ SOLN
INTRAMUSCULAR | Status: AC
  Filled 2015-01-20: qty 250

## 2015-01-20 MED ORDER — SODIUM CHLORIDE 0.9 % IJ SOLN: 3.0000 mL | INTRAMUSCULAR | Status: DC | PRN

## 2015-01-20 MED ORDER — GLIPIZIDE 5 MG PO TABS
5.0000 mg | ORAL_TABLET | Freq: Two times a day (BID) | ORAL | Status: DC
  Administered 2015-01-21 – 2015-01-22 (×3): 5 mg via ORAL
  Filled 2015-01-20 (×5): qty 1

## 2015-01-20 MED ORDER — ONDANSETRON HCL 4 MG/2ML IJ SOLN
4.0000 mg | Freq: Four times a day (QID) | INTRAMUSCULAR | Status: DC | PRN
  Administered 2015-01-20: 4 mg via INTRAVENOUS
  Filled 2015-01-20: qty 2

## 2015-01-20 MED ORDER — HEPARIN (PORCINE) IN NACL 100-0.45 UNIT/ML-% IJ SOLN
1100.0000 [IU]/h | INTRAMUSCULAR | Status: DC
  Administered 2015-01-20: 12 [IU]/kg/h via INTRAVENOUS
  Filled 2015-01-20: qty 250

## 2015-01-20 MED ORDER — SODIUM CHLORIDE 0.9 % IV SOLN
INTRAVENOUS | Status: DC
  Administered 2015-01-20: 75 mL/h via INTRAVENOUS

## 2015-01-20 MED ORDER — NITROGLYCERIN 1 MG/10 ML FOR IR/CATH LAB
INTRA_ARTERIAL | Status: AC
  Filled 2015-01-20: qty 10

## 2015-01-20 MED ORDER — ONDANSETRON HCL 4 MG/2ML IJ SOLN
4.0000 mg | Freq: Once | INTRAMUSCULAR | Status: AC
  Administered 2015-01-20: 4 mg via INTRAVENOUS

## 2015-01-20 MED ORDER — ASPIRIN EC 81 MG PO TBEC
81.0000 mg | DELAYED_RELEASE_TABLET | Freq: Every day | ORAL | Status: DC
Start: 2015-01-21 — End: 2015-01-22
  Administered 2015-01-21 – 2015-01-22 (×2): 81 mg via ORAL
  Filled 2015-01-20 (×2): qty 1

## 2015-01-20 MED ORDER — FENTANYL CITRATE 0.05 MG/ML IJ SOLN
50.0000 ug | Freq: Once | INTRAMUSCULAR | Status: AC
Start: 2015-01-20 — End: 2015-01-20
  Administered 2015-01-20: 50 ug via INTRAVENOUS
  Filled 2015-01-20: qty 2

## 2015-01-20 MED ORDER — VERAPAMIL HCL 2.5 MG/ML IV SOLN
INTRAVENOUS | Status: AC
  Filled 2015-01-20: qty 2

## 2015-01-20 NOTE — Care Management Note (Signed)
    Page 1 of 1   01/20/2015     2:44:21 PM CARE MANAGEMENT NOTE 01/20/2015  Patient:  Tim Day, Tim Day   Account Number:  [id number]  Date Initiated:  01/20/2015  Documentation initiated by:  Elissa Hefty  Subjective/Objective Assessment:   adm w ch pain     Action/Plan:   lives w wife, pcp dr zsch hall   Anticipated DC Date:     Anticipated DC Plan:  HOME/SELF CARE         Choice offered to / List presented to:             Status of service:   Medicare Important Message given?   (If response is "NO", the following Medicare IM given date fields will be blank) Date Medicare IM given:   Medicare IM given by:   Date Additional Medicare IM given:   Additional Medicare IM given by:    Discharge Disposition:    Per UR Regulation:  Reviewed for med. necessity/level of care/duration of stay  If discussed at [place] of Stay Meetings, dates discussed:    Comments:

## 2015-01-20 NOTE — Progress Notes (Signed)
PHARMACIST - PHYSICIAN COMMUNICATION DR:  Debara Pickett CONCERNING:  METFORMIN SAFE ADMINISTRATION POLICY  RECOMMENDATION: Metformin has been placed on DISCONTINUE (rejected order) STATUS and should be reordered only after any of the conditions below are ruled out.  Current safety recommendations include avoiding metformin for a minimum of 48 hours after the patient's exposure to intravenous contrast media.  DESCRIPTION:  The Pharmacy Committee has adopted a policy that restricts the use of metformin in hospitalized patients until all the contraindications to administration have been ruled out. Specific contraindications are: [x]  Serum creatinine ? 1.5 for males []  Serum creatinine ? 1.4 for females []  Shock, acute MI, sepsis, hypoxemia, dehydration []  Planned administration of intravenous iodinated contrast media []  Heart Failure patients with low EF []  Acute or chronic metabolic acidosis (including DKA)
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
## 2015-01-20 NOTE — Interval H&P Note (Signed)
History and Physical Interval Note:  01/20/2015 3:22 PM  Tim Day  has presented today for surgery, with the diagnosis of Acute Coronary Syndrome.  The various methods of treatment have been discussed with the patient and family. After consideration of risks, benefits and other options for treatment, the patient has consented to  Procedure(s): LEFT HEART CATHETERIZATION WITH CORONARY ANGIOGRAM (N/A) +/- PCI as a surgical intervention .  The patient's history has been reviewed, patient examined, no change in status, stable for surgery.  I have reviewed the patient's chart and labs.  Questions were answered to the patient's satisfaction.    Cath Lab Visit (complete for each Cath Lab visit)  Clinical Evaluation Leading to the Procedure:   ACS: Yes.    Non-ACS:    Anginal Classification: CCS IV  Anti-ischemic medical therapy: Minimal Therapy (1 class of medications)  Non-Invasive Test Results: No non-invasive testing performed  Prior CABG: No previous CABG   TIMI SCORE  Patient Information:  TIMI Score is 3  UA/NSTEMI and intermediate-risk features (e.g., TIMI score 3?4) for short-term risk of death or nonfatal MI  Revascularization of the presumed culprit artery   A (9)  Indication: 10; Score: 9    Tim Day

## 2015-01-20 NOTE — ED Notes (Signed)
Pt c/o cp with dizziness/n/v/weakness since 1000.Marland Kitchen Pt paler and diaphoretic.

## 2015-01-20 NOTE — Progress Notes (Addendum)
ANTICOAGULATION CONSULT NOTE - Initial Consult  Pharmacy Consult for Heparin Indication: chest pain/ACS  Allergies  Allergen Reactions  . Eggs Or Egg-Derived Products Hives    Patient Measurements: Height: 5\' 10"  (177.8 cm) Weight: 205 lb 11 oz (93.3 kg) IBW/kg (Calculated) : 73 Heparin Dosing Weight: 92 kg  Vital Signs: Temp: 98 F (36.7 C) (02/19 1400) Temp Source: Oral (02/19 1400) BP: 133/38 mmHg (02/19 1445) Pulse Rate: 60 (02/19 1445)  Labs:  Recent Labs  01/20/15 1158  HGB 16.1  HCT 46.6  PLT 213  LABPROT 14.8  INR 1.14  CREATININE 1.49*  TROPONINI <0.03    Estimated Creatinine Clearance: 59.7 mL/min (by C-G formula based on Cr of 1.49).   Medical History: Past Medical History  Diagnosis Date  . Diabetes mellitus   . Hypertension   . Hypercholesteremia     Medications:  Prescriptions prior to admission  Medication Sig Dispense Refill Last Dose  . aspirin 325 MG tablet Take 325 mg by mouth daily.   01/20/2015 at Unknown time  . diphenhydrAMINE (BENADRYL) 25 MG tablet Take 1 tablet (25 mg total) by mouth every 6 (six) hours. 20 tablet 0 unknown  . EPINEPHrine (EPIPEN) 0.3 mg/0.3 mL DEVI Inject 0.3 mLs (0.3 mg total) into the muscle as needed. 3 Device 3 unknown  . esomeprazole (NEXIUM) 40 MG capsule Take 40 mg by mouth daily as needed (heartburn / indigestion).   unknown  . glipiZIDE (GLUCOTROL) 5 MG tablet Take 5 mg by mouth 2 (two) times daily before a meal.   01/20/2015 at Unknown time  . metFORMIN (GLUCOPHAGE) 1000 MG tablet Take 1,000 mg by mouth 2 (two) times daily with a meal.   01/20/2015 at Unknown time  . valsartan (DIOVAN) 320 MG tablet Take 320 mg by mouth daily.   01/20/2015 at 0530  . predniSONE (DELTASONE) 10 MG tablet Take 2 tablets (20 mg total) by mouth daily. (Patient not taking: Reported on 01/20/2015) 10 tablet 0   . predniSONE (DELTASONE) 10 MG tablet Take 2 tablets (20 mg total) by mouth daily. (Patient not taking: Reported on  01/20/2015) 15 tablet 0   . ranitidine (ZANTAC) 150 MG tablet Take 1 tablet (150 mg total) by mouth 2 (two) times daily. (Patient not taking: Reported on 01/20/2015) 30 tablet 0    Scheduled:  . sodium chloride   Intravenous STAT  . [START ON 01/21/2015] aspirin EC  81 mg Oral Daily  . [START ON 01/21/2015] glipiZIDE  5 mg Oral BID AC  . pantoprazole  40 mg Oral Daily  . sodium chloride  3 mL Intravenous Q12H   Infusions:  . sodium chloride 75 mL/hr (01/20/15 1444)  . heparin      Assessment: 64yo male with history of DM2, HTN, and HLD presents to [facility] ED with diaphoresis, nausea, and chest pain and was transferred for further workup. Pharmacy is consulted to dose heparin for chest pain/ACS. Trop neg x1, sCr 1.49, CBC wnl.  Heparin was started at [facility] ED. There is no documentation from [facility] that a bolus of heparin was given and they started heparin at 1100 units/h.  Patient has already left for cath lab.  Goal of Therapy:  Heparin level 0.3-0.7 units/ml Monitor platelets by anticoagulation protocol: Yes   Plan:  Heparin infusion at 1100 units/hr Check anti-Xa level in 6 hours and daily while on heparin Continue to monitor H&H and platelets  Monitor s/sx of bleeding F/u after cath  Andrey Cota. Diona Foley, PharmD Clinical Pharmacist  Pager [phone] 01/20/2015,3:04 PM

## 2015-01-20 NOTE — ED Notes (Signed)
Attempted to call report to [facility]. RN unavailable at present. RN to call back.

## 2015-01-20 NOTE — ED Notes (Signed)
Report given to Larene Beach, RN [facility] at this time. ETA to ED 15 minutes.
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
## 2015-01-20 NOTE — ED Notes (Addendum)
Patient left ED at this time with [facility] crew. Heparin and normal saline infusing on departure. See MAR for details. Report given to Clarene Critchley, Therapist, sports at [place]

## 2015-01-20 NOTE — ED Notes (Signed)
[facility] at bedside 

## 2015-01-20 NOTE — ED Notes (Signed)
Called [facility] with bed assignment, informed by Jonni Sanger that they are sending a truck.

## 2015-01-20 NOTE — CV Procedure (Signed)
CARDIAC CATHETERIZATION REPORT  NAME:  Tim Day   MRN: [id number] DOB:  1951/05/22   ADMIT DATE: 01/20/2015 Procedure Date: 01/20/2015  INTERVENTIONAL CARDIOLOGIST: Leonie Man, M.D., MS PRIMARY CARE PROVIDER: Delphina Cahill, MD PRIMARY CARDIOLOGIST:  Hilty, Kenneth C. ([place]ali), MD  PATIENT:  Tim Day is a 64 y.o. male with a past medical history significant for DM2, hypertension, dyslipidemia and a family history of CAD with his father who had CABG in his 44's and a redo CABG later. He presented to the [facility] today with diaphoresis, nausea and chest pain -described as burning quality in the substernal region. Pain was 4-5/10 on admission, now subsided but still persistent on heparin. BP was in the 94'I systolic and continued to decline, requiring inotropic support. There was witnessed nausea and vomiting. Initial labs are generally unremarkable except for elevated creatinine. Initial troponin is negative. EKG shows anterior and inferior infarct patterns with inferior T wave inversions. He is referred for Urgent Cardiac Catheterization for possible Acute Coronary Syndrome.  PRE-OPERATIVE DIAGNOSIS:    Acute Coronary Syndrome  Hypotension / Shock  Bradycardia  PROCEDURES PERFORMED:    Left Heart Catheterization with Native Coronary Angiography  via Right Radial Artery   Left Ventriculography  PROCEDURE: The patient was brought to the 2nd [facility] in the fasting state and prepped and draped in the usual sterile fashion for Right Radial artery access. A modified Allen's test was performed on the Right wrist demonstrating excellent collateral flow for radial access.   Sterile technique was used including antiseptics, cap, gloves, gown, hand hygiene, mask and sheet. Skin prep: Chlorhexidine.   Consent: Risks of procedure as well as the alternatives and risks of each were explained to the (patient/caregiver). Consent for  procedure obtained.   Time Out: Verified patient identification, verified procedure, site/side was marked, verified correct patient position, special equipment/implants available, medications/allergies/relevent history reviewed, required imaging and test results available. Performed.  Access:   Right Radial Artery: 5 Fr Sheath -  Seldinger Technique (Angiocath Micropuncture Kit)  Radial Cocktail - 10 mL; IV Heparin 5000 Units   Left Heart Catheterization: 5Fr Catheters advanced or exchanged over a long exchange safety J-wire; TIG 4.0 catheter advanced first.  Left Right & Coronary Artery Cineangiography: TIG 4.0 Catheter   LV Hemodynamics (LV Gram): Angled pigtail   Sheath removed in the cardiac cath lab with TR band placementor hemostasis.  TR Band: 1610 Hours; 50m air  FINDINGS:  Hemodynamics:   Central Aortic Pressure / Mean: 94/47/61 mmHg - on 7.5 mg/kilogram/min of dopamine   Left Ventricular Pressure / LVEDP: 95/6/22 mmHg  Left Ventriculography: Hand injection  EF: Roughly 50 %  Wall Motion: Appears to be normal  Coronary Anatomy:  Dominance: Right  Left Main: Normal caliber vessel that trifurcates into the LAD, Circumflex, and Ramus Intermedius  LAD: Moderate/normal caliber vessel that reaches down to the apex. It gives rise to a proximal equal sized diagonal branch. The diagnosis of severe disease. The mid LAD has a diffuse is intramyocardial segment with myocardial bridging noted but no significant lesions.   Left Circumflex: Small caliber, nondominant vessel that bifurcates into a small AV groove branch and a moderate caliber lateral OM. Minimal luminal irregularities.  Ramus intermedius: Moderate caliber vessel that bifurcates very proximal into 2 small caliber branches. There is mild luminal irregular.  RCA: Large-caliber, dominant vessel with several RV marginal branches. His diffuse mild intermittent 10% lesions but no significant stenoses. It  bifurcates  distally into the  Right Posterior AV Groove Branch (RPAV) and the Right Posterior Descending Artery (RPDA).  RPDA: Moderate-to-large-caliber vessel that reaches all the way to the apex. Minimal luminal irregularities  RPL Sysytem:The RPAV moderate to large caliber that bifurcates into 2 medium caliber posterolateral branches.  MEDICATIONS:  Anesthesia:  Local Lidocaine 2 ml  Sedation:  1 mg IV Versed,   Omnipaque Contrast: 80 ml  Anticoagulation:  IV Heparin 5000 Units  Radial Cocktail: 5 mg Verapamil, 400 mcg NTG, 2 ml 2% Lidocaine in 10 ml NS  PATIENT DISPOSITION:    The patient was transferred to the PACU holding area in a hemodynamicaly stable, chest pain free condition.  The patient tolerated the procedure well, and there were no complications.  EBL:   < 5 ml  The patient was stable before, during, and after the procedure.  POST-OPERATIVE DIAGNOSIS:    Angiographically normal coronary arteries   At least low-normal EF with mildly elevated LVEDP  Hypotension / Shock - on Dopamine  PLAN OF CARE:  Return to CCU for complete evaluation.  Continue Dopamine for now until BP stabilized    HARDING, Leonie Green, M.D., M.S. Interventional Cardiologist   Pager # [phone]

## 2015-01-21 ENCOUNTER — Encounter (HOSPITAL_COMMUNITY): Payer: Self-pay | Admitting: Cardiology

## 2015-01-21 ENCOUNTER — Inpatient Hospital Stay (HOSPITAL_COMMUNITY): Payer: 59

## 2015-01-21 DIAGNOSIS — R1013 Epigastric pain: Secondary | ICD-10-CM

## 2015-01-21 DIAGNOSIS — I9589 Other hypotension: Secondary | ICD-10-CM

## 2015-01-21 DIAGNOSIS — R9431 Abnormal electrocardiogram [ECG] [EKG]: Secondary | ICD-10-CM

## 2015-01-21 LAB — URINALYSIS, ROUTINE W REFLEX MICROSCOPIC
Bilirubin Urine: NEGATIVE
Glucose, UA: NEGATIVE mg/dL
HGB URINE DIPSTICK: NEGATIVE
Ketones, ur: NEGATIVE mg/dL
Leukocytes, UA: NEGATIVE
Nitrite: NEGATIVE
Protein, ur: NEGATIVE mg/dL
SPECIFIC GRAVITY, URINE: 1.021 (ref 1.005–1.030)
Urobilinogen, UA: 0.2 mg/dL (ref 0.0–1.0)
pH: 5.5 (ref 5.0–8.0)

## 2015-01-21 LAB — D-DIMER, QUANTITATIVE: D-Dimer, Quant: 0.43 ug/mL-FEU (ref 0.00–0.48)

## 2015-01-21 LAB — COMPREHENSIVE METABOLIC PANEL
ALT: 26 U/L (ref 0–53)
AST: 20 U/L (ref 0–37)
Albumin: 3.2 g/dL — ABNORMAL LOW (ref 3.5–5.2)
Alkaline Phosphatase: 38 U/L — ABNORMAL LOW (ref 39–117)
Anion gap: 4 — ABNORMAL LOW (ref 5–15)
BUN: 20 mg/dL (ref 6–23)
CALCIUM: 8.2 mg/dL — AB (ref 8.4–10.5)
CHLORIDE: 109 mmol/L (ref 96–112)
CO2: 24 mmol/L (ref 19–32)
Creatinine, Ser: 1.01 mg/dL (ref 0.50–1.35)
GFR, EST NON AFRICAN AMERICAN: 78 mL/min — AB (ref >90)
Glucose, Bld: 136 mg/dL — ABNORMAL HIGH (ref 70–99)
POTASSIUM: 4.5 mmol/L (ref 3.5–5.1)
Sodium: 137 mmol/L (ref 135–145)
TOTAL PROTEIN: 5.3 g/dL — AB (ref 6.0–8.3)
Total Bilirubin: 0.6 mg/dL (ref 0.3–1.2)

## 2015-01-21 LAB — GLUCOSE, CAPILLARY
GLUCOSE-CAPILLARY: 145 mg/dL — AB (ref 70–99)
GLUCOSE-CAPILLARY: 82 mg/dL (ref 70–99)
GLUCOSE-CAPILLARY: 117 mg/dL — AB (ref 70–99)
Glucose-Capillary: 94 mg/dL (ref 70–99)

## 2015-01-21 LAB — AMYLASE: Amylase: 109 U/L — ABNORMAL HIGH (ref 0–105)

## 2015-01-21 LAB — LIPID PANEL
Cholesterol: 126 mg/dL (ref 0–200)
HDL: 27 mg/dL — AB (ref >39)
LDL Cholesterol: 76 mg/dL (ref 0–99)
Total CHOL/HDL Ratio: 4.7 RATIO
Triglycerides: 115 mg/dL (ref <150)
VLDL: 23 mg/dL (ref 0–40)

## 2015-01-21 LAB — CBC
HCT: 40.8 % (ref 39.0–52.0)
HEMOGLOBIN: 13.7 g/dL (ref 13.0–17.0)
MCH: 28.1 pg (ref 26.0–34.0)
MCHC: 33.6 g/dL (ref 30.0–36.0)
MCV: 83.8 fL (ref 78.0–100.0)
PLATELETS: 135 10*3/uL — AB (ref 150–400)
RBC: 4.87 MIL/uL (ref 4.22–5.81)
RDW: 13.5 % (ref 11.5–15.5)
WBC: 6.8 10*3/uL (ref 4.0–10.5)

## 2015-01-21 LAB — LIPASE, BLOOD: Lipase: 49 U/L (ref 11–59)

## 2015-01-21 NOTE — Progress Notes (Signed)
Subjective:   64 y.o. male with DM2, hypertension, dyslipidemia and a family history of CAD transferred from [facility] with diaphoresis, nausea and epigastric pain with SBP in 80s and inferior qs and T wave inversions on ECG. Started on dopamine  Cath yesterday with essentially normal cors. EF 50%. LVEDP 22. Trop negative x 1. Lipase slightly elevated  Echo EF 65-70% no RWMA. RV normal size with apical HK. . Cr 1.5 -> 1.0  Feels good. Off dopamine. SBP 90s. No CP, SOB or epigastric pain     Intake/Output Summary (Last 24 hours) at 01/21/15 1027 Last data filed at 01/21/15 1000  Gross per 24 hour  Intake 1957.4 ml  Output    205 ml  Net 1752.4 ml    Current meds: . aspirin EC  81 mg Oral Daily  . enoxaparin (LOVENOX) injection  40 mg Subcutaneous Q24H  . glipiZIDE  5 mg Oral BID AC  . pantoprazole  40 mg Oral Daily  . sodium chloride  3 mL Intravenous Q12H   Infusions: . sodium chloride 75 mL/hr (01/20/15 1444)  . DOPamine 5 mcg/kg/min (01/20/15 1700)     Objective:  Blood pressure 95/52, pulse 78, temperature 98 F (36.7 C), temperature source Oral, resp. rate 18, height 5\' 10"  (1.778 m), weight 93.3 kg (205 lb 11 oz), SpO2 96 %. Weight change:   Physical Exam: General:  Well appearing. No resp difficulty HEENT: normal Neck: supple. JVP . Carotids 2+ bilat; no bruits. No lymphadenopathy or thryomegaly appreciated. Cor: PMI nondisplaced. Regular rate & rhythm. No rubs, gallops or murmurs. Lungs: clear Abdomen: obese soft, nontender, nondistended. No hepatosplenomegaly. No bruits or masses. Good bowel sounds. Extremities: no cyanosis, clubbing, rash, edema Neuro: alert & orientedx3, cranial nerves grossly intact. moves all 4 extremities w/o difficulty. Affect pleasant  Telemetry: SR  Lab Results: Basic Metabolic Panel:  Recent Labs Lab 01/20/15 1158 01/20/15 1945 01/21/15 0407  NA 135  --  137  K 4.8  --  4.5  CL 103  --  109  CO2 22  --  24  GLUCOSE 263*  --   136*  BUN 34*  --  20  CREATININE 1.49* 1.23 1.01  CALCIUM 9.4  --  8.2*   Liver Function Tests:  Recent Labs Lab 01/20/15 1158 01/21/15 0407  AST 25 20  ALT 26 26  ALKPHOS 44 38*  BILITOT 1.0 0.6  PROT 6.9 5.3*  ALBUMIN 4.3 3.2*    Recent Labs Lab 01/20/15 1158 01/21/15 0407  LIPASE 74* 49  AMYLASE  --  109*   No results for input(s): AMMONIA in the last 168 hours. CBC:  Recent Labs Lab 01/20/15 1158 01/20/15 1945 01/21/15 0407  WBC 10.5 7.8 6.8  NEUTROABS 6.6  --   --   HGB 16.1 14.2 13.7  HCT 46.6 41.3 40.8  MCV 85.3 84.6 83.8  PLT 213 129* 135*   Cardiac Enzymes:  Recent Labs Lab 01/20/15 1158  TROPONINI <0.03   BNP: Invalid input(s): POCBNP CBG:  Recent Labs Lab 01/20/15 1141 01/20/15 1709 01/20/15 2148 01/21/15 0741  GLUCAP 262* 152* 110* 145*   Microbiology: No results found for: CULT No results for input(s): CULT, SDES in the last 168 hours.  Imaging: Dg Chest Portable 1 View  01/20/2015   CLINICAL DATA:  3 hr history of chest pain  EXAM: PORTABLE CHEST - 1 VIEW  COMPARISON:  January 25, 2009  FINDINGS: Degree of inspiration is shallow. There is no edema or  consolidation. Heart is mildly enlarged with pulmonary vascularity within normal limits. No adenopathy. No pneumothorax. No bone lesions.  IMPRESSION: Mild cardiac enlargement.  No edema or consolidation.   Electronically Signed   By: Lowella Grip III M.D.   On: 01/20/2015 12:51     ASSESSMENT:  1. Epigastric pain, resolved 2. Abnormal ECG     --normal coronaries by cath 2/19 3. Hypotension  PLAN/DISCUSSION:  He looks much better today. It remains unclear to me as to what happened to him. Cath looks good. Symptoms and elevated lipase concerning for possible rolled gallstone. Will check RUQ u/s. Will also check UA to exclude kidney stones (look for blood).   With RV apical HK, PE is remote possibility. Will check d-dimer. If + will get chest and ab CT tomorrow.  Can go  to tele.    LOS: 1 day  Glori Bickers, MD 01/21/2015, 10:27 AM

## 2015-01-21 NOTE — Plan of Care (Signed)
Problem: Phase I Progression Outcomes Goal: Vascular site scale level 0 - I Vascular Site Scale Level 0: No bruising/bleeding/hematoma Level I (Mild): Bruising/Ecchymosis, minimal bleeding/ooozing, palpable hematoma < 3 cm Level II (Moderate): Bleeding not affecting hemodynamic parameters, pseudoaneurysm, palpable hematoma > 3 cm Level III (Severe) Bleeding which affects hemodynamic parameters or retroperitoneal hemorrhage  Outcome: Completed/Met Date Met:  01/21/15 Right rad. Lev 0

## 2015-01-21 NOTE — Progress Notes (Signed)
Order to transfer to [place], [place] 3W16 available. Pt and wife informed and verbalized understanding. Report called to Janett Billow, Therapist, sports. Pt still needs U/s of abd done later this afternoon and a UA. D. Dimer done, result pending. Pt's belongings packed. Pt transported via w/c with belongings and accompanied by wife. VSS.

## 2015-01-22 DIAGNOSIS — E785 Hyperlipidemia, unspecified: Secondary | ICD-10-CM

## 2015-01-22 DIAGNOSIS — Z9689 Presence of other specified functional implants: Secondary | ICD-10-CM

## 2015-01-22 DIAGNOSIS — R748 Abnormal levels of other serum enzymes: Secondary | ICD-10-CM

## 2015-01-22 DIAGNOSIS — D696 Thrombocytopenia, unspecified: Secondary | ICD-10-CM

## 2015-01-22 DIAGNOSIS — I10 Essential (primary) hypertension: Secondary | ICD-10-CM | POA: Diagnosis present

## 2015-01-22 DIAGNOSIS — R079 Chest pain, unspecified: Secondary | ICD-10-CM

## 2015-01-22 DIAGNOSIS — E7800 Pure hypercholesterolemia, unspecified: Secondary | ICD-10-CM | POA: Diagnosis present

## 2015-01-22 LAB — CBC
HCT: 42 % (ref 39.0–52.0)
Hemoglobin: 13.8 g/dL (ref 13.0–17.0)
MCH: 28 pg (ref 26.0–34.0)
MCHC: 32.9 g/dL (ref 30.0–36.0)
MCV: 85.4 fL (ref 78.0–100.0)
Platelets: 124 10*3/uL — ABNORMAL LOW (ref 150–400)
RBC: 4.92 MIL/uL (ref 4.22–5.81)
RDW: 13.6 % (ref 11.5–15.5)
WBC: 4.8 10*3/uL (ref 4.0–10.5)

## 2015-01-22 LAB — COMPREHENSIVE METABOLIC PANEL
ALBUMIN: 3.4 g/dL — AB (ref 3.5–5.2)
ALT: 23 U/L (ref 0–53)
AST: 18 U/L (ref 0–37)
Alkaline Phosphatase: 37 U/L — ABNORMAL LOW (ref 39–117)
Anion gap: 4 — ABNORMAL LOW (ref 5–15)
BUN: 21 mg/dL (ref 6–23)
CALCIUM: 8.7 mg/dL (ref 8.4–10.5)
CO2: 27 mmol/L (ref 19–32)
CREATININE: 1.05 mg/dL (ref 0.50–1.35)
Chloride: 106 mmol/L (ref 96–112)
GFR calc non Af Amer: 75 mL/min — ABNORMAL LOW (ref >90)
GFR, EST AFRICAN AMERICAN: 87 mL/min — AB (ref >90)
Glucose, Bld: 175 mg/dL — ABNORMAL HIGH (ref 70–99)
POTASSIUM: 4.7 mmol/L (ref 3.5–5.1)
Sodium: 137 mmol/L (ref 135–145)
Total Bilirubin: 0.7 mg/dL (ref 0.3–1.2)
Total Protein: 5.7 g/dL — ABNORMAL LOW (ref 6.0–8.3)

## 2015-01-22 LAB — GLUCOSE, CAPILLARY
Glucose-Capillary: 152 mg/dL — ABNORMAL HIGH (ref 70–99)
Glucose-Capillary: 74 mg/dL (ref 70–99)

## 2015-01-22 MED ORDER — METFORMIN HCL 1000 MG PO TABS: 1000.0000 mg | ORAL_TABLET | Freq: Two times a day (BID) | ORAL | Status: DC

## 2015-01-22 MED ORDER — ASPIRIN 81 MG PO TBEC: 81.0000 mg | DELAYED_RELEASE_TABLET | Freq: Every day | ORAL | Status: DC

## 2015-01-22 NOTE — Discharge Summary (Signed)
Discharge Summary   Patient ID: Tim Day MRN: [id number], DOB/AGE: [birth year] 64 y.o. Admit date: 01/20/2015 D/C date:     01/22/2015  Primary Cardiologist: New ( Seen by Dr. Debara Pickett)   Principal Problem:   Chest pain Active Problems:   Cardiogenic shock   DM2 (diabetes mellitus, type 2)   Benign essential HTN   Dyslipidemia   Hypercholesteremia   Hypertension   Admission Dates: 01/20/15- 01/22/15 Discharge Diagnosis: ECG changes, epigastric pain, and hypotension - unclear etiology. Now resolved.  HPI: Tim VATH is a 64 y.o. male with a history of DM2, hypertension, dyslipidemia and a family history of CAD (father with CABG in 23s) who was transferred to [facility] from [place] with diaphoresis, nausea and epigastric pain/CP and taken back for urgent cardiac catheterization.  He presented to the [facility] ER with diaphoresis, nausea and chest pain described as burning quality in the substernal region. Pain was 4-5/10 on admission. BP was in the 56'C systolic and continued to decline, requiring inotropic support. There was witnessed nausea and vomiting. Initial labs were generally unremarkable except for elevated creatinine. Initial troponin was negative. EKG revealed anterior and inferior infarct patterns with inferior T wave inversions. He was transferred to [facility] referred for urgent cardiac catheterization for possible acute coronary syndrome.  Hospital Course  ECG changes, epigastric pain, and hypotension: Resolved.  -- Echo demonstrated vigorous LV systolic function, EF 12-75%, with mild apical and inferoapical hypokinesis and RV apical hypokinesis. --  Normal coronaries, EF 50%. LVEDP 22 by cath on 01/20/15. Troponin neg.  -- Lipase initially mildly elevated, then normalized.  -- D Dimer negative.  -- UA and RUQ ultrasound normal. -- Question possible transient gallstone? Unclear etiology for presentation. No further workup necessary.  Thrombocytopenia: PLTs 124. Very  mild. Will f/u with PCP.  AKI- now resolved. Cr 1.5 -> 1.05  DM- okay to resume Meformin tomorrow AM when >48 hours post contrast exposure.  -- Resume home regimen  HTN- initially hypotensive requiring DA. Now normotensive. Continue hime Diovan 320mg .   The patient has had an uncomplicated hospital course and is recovering well. The radial catheter site is stable. He has been seen by Dr. Claudie Leach today and deemed ready for discharge home. Discharge medications are listed below.   Discharge Vitals: Blood pressure 116/69, pulse 68, temperature 98 F (36.7 C), temperature source Oral, resp. rate 18, height 5\' 10"  (1.778 m), weight 209 lb (94.802 kg), SpO2 99 %.  Labs: Lab Results  Component Value Date   WBC 4.8 01/22/2015   HGB 13.8 01/22/2015   HCT 42.0 01/22/2015   MCV 85.4 01/22/2015   PLT 124* 01/22/2015     Recent Labs Lab 01/22/15 0501  NA 137  K 4.7  CL 106  CO2 27  BUN 21  CREATININE 1.05  CALCIUM 8.7  PROT 5.7*  BILITOT 0.7  ALKPHOS 37*  ALT 23  AST 18  GLUCOSE 175*    Recent Labs  01/20/15 1158  TROPONINI <0.03   Lab Results  Component Value Date   CHOL 126 01/21/2015   HDL 27* 01/21/2015   LDLCALC 76 01/21/2015   TRIG 115 01/21/2015   Lab Results  Component Value Date   DDIMER 0.43 01/21/2015    Diagnostic Studies/Procedures   Dg Chest Portable 1 View  01/20/2015   CLINICAL DATA:  3 hr history of chest pain  EXAM: PORTABLE CHEST - 1 VIEW  COMPARISON:  January 25, 2009  FINDINGS: Degree of inspiration  is shallow. There is no edema or consolidation. Heart is mildly enlarged with pulmonary vascularity within normal limits. No adenopathy. No pneumothorax. No bone lesions.  IMPRESSION: Mild cardiac enlargement.  No edema or consolidation.   Electronically Signed   By: Lowella Grip III M.D.   On: 01/20/2015 12:51   US Abdomen Limited Ruq  01/21/2015   CLINICAL DATA:  Epigastric pain  EXAM: US ABDOMEN LIMITED - RIGHT UPPER QUADRANT   COMPARISON:  CT scan 07/03/2009  FINDINGS: Gallbladder:  No gallstones or wall thickening visualized. No sonographic Murphy sign noted.  Common bile duct:  Diameter: 4 mm in diameter within normal limits.  Liver:  No focal lesion identified. Mild heterogeneous echotexture without intrahepatic biliary ductal dilatation.  IMPRESSION: Unremarkable right upper quadrant ultrasound.   Electronically Signed   By: Lahoma Crocker M.D.   On: 01/21/2015 18:12     CARDIAC CATHETERIZATION REPORT 01/20/15 PRE-OPERATIVE DIAGNOSIS:   Acute Coronary Syndrome  Hypotension / Shock  Bradycardia FINDINGS:  Hemodynamics:   Central Aortic Pressure / Mean: 94/47/61 mmHg - on 7.5 mg/kilogram/min of dopamine  Left Ventricular Pressure / LVEDP: 95/6/22 mmHg  Left Ventriculography: Hand injection  EF: Roughly 50 %  Wall Motion: Appears to be normal  Coronary Anatomy:  Dominance: Right  Left Main: Normal caliber vessel that trifurcates into the LAD, Circumflex, and Ramus Intermedius LAD: Moderate/normal caliber vessel that reaches down to the apex. It gives rise to a proximal equal sized diagonal branch. The diagnosis of severe disease. The mid LAD has a diffuse is intramyocardial segment with myocardial bridging noted but no significant lesions.  Left Circumflex: Small caliber, nondominant vessel that bifurcates into a small AV groove branch and a moderate caliber lateral OM. Minimal luminal irregularities.  Ramus intermedius: Moderate caliber vessel that bifurcates very proximal into 2 small caliber branches. There is mild luminal irregular.  RCA: Large-caliber, dominant vessel with several RV marginal branches. His diffuse mild intermittent 10% lesions but no significant stenoses. It bifurcates distally into the Right Posterior AV Groove Branch (RPAV) and the Right Posterior Descending Artery (RPDA).  RPDA: Moderate-to-large-caliber vessel that reaches all the way to the apex. Minimal  luminal irregularities  RPL Sysytem:The RPAV moderate to large caliber that bifurcates into 2 medium caliber posterolateral branches.  POST-OPERATIVE DIAGNOSIS:   Angiographically normal coronary arteries   At least low-normal EF with mildly elevated LVEDP  Hypotension / Shock - on Dopamine   2D ECHO Study Date: 01/20/2015 LV EF: 65% -  70% Study Conclusions - Left ventricle: The cavity size was normal. Wall thickness was increased in a pattern of mild LVH. Systolic function was vigorous. The estimated ejection fraction was in the range of 65% to 70%. There is apical and distal inferoapical hypokinesis of the LV. The study is not technically sufficient to allow evaluation of LV diastolic function. - Ventricular septum: Incoordinate distal septal motion. - Aortic valve: Sclerosis without stenosis. There was no regurgitation. - Mitral valve: Mildly thickened leaflets . There was trivial regurgitation. - Left atrium: The atrium was at the upper limits of normal in size. - Right ventricle: The cavity size was normal. Mild apical hypokinesis. Normal TAPSE. - Right atrium: The atrium was normal in size. Impressions: - LVEF 65-70%, mild apical and inferoapical hypokinesis. Distal septal incoordinate motion. RV apical hypokinsis with normal TAPSE. Dilated IVC.   Discharge Medications     Medication List    STOP taking these medications        aspirin 325 MG  tablet  Replaced by:  aspirin 81 MG EC tablet     predniSONE 10 MG tablet  Commonly known as:  DELTASONE      TAKE these medications        aspirin 81 MG EC tablet  Take 1 tablet (81 mg total) by mouth daily.     diphenhydrAMINE 25 MG tablet  Commonly known as:  BENADRYL  Take 1 tablet (25 mg total) by mouth every 6 (six) hours.     EPINEPHrine 0.3 mg/0.3 mL Devi  Commonly known as:  EPIPEN  Inject 0.3 mLs (0.3 mg total) into the muscle as needed.     esomeprazole 40 MG capsule    Commonly known as:  NEXIUM  Take 40 mg by mouth daily as needed (heartburn / indigestion).     glipiZIDE 5 MG tablet  Commonly known as:  GLUCOTROL  Take 5 mg by mouth 2 (two) times daily before a meal.     metFORMIN 1000 MG tablet  Commonly known as:  GLUCOPHAGE  Take 1 tablet (1,000 mg total) by mouth 2 (two) times daily with a meal.  Start taking on:  01/23/2015     ranitidine 150 MG tablet  Commonly known as:  ZANTAC  Take 1 tablet (150 mg total) by mouth 2 (two) times daily.     valsartan 320 MG tablet  Commonly known as:  DIOVAN  Take 320 mg by mouth daily.        Disposition   The patient will be discharged in stable condition to home.  Follow-up Information    Follow up with Delphina Cahill, MD.   Specialty:  Internal Medicine   Why:  Please follow up with your primary care doctor   Contact information:    [place] [id number] [phone]         Duration of Discharge Encounter: Greater than 30 minutes including physician and PA time.  Mable Fill R PA-C 01/22/2015, 3:07 PM

## 2015-01-22 NOTE — Progress Notes (Signed)
SUBJECTIVE: Pt walking around room and feeling very well. Eating well. Denies chest pain, abdominal pain, and shortness of breath. UA, d-dimer, and RUQ ultrasound all normal.     Intake/Output Summary (Last 24 hours) at 01/22/15 1440 Last data filed at 01/22/15 0809  Gross per 24 hour  Intake    240 ml  Output      0 ml  Net    240 ml    Current Facility-Administered Medications  Medication Dose Route Frequency Provider Last Rate Last Dose  . 0.9 %  sodium chloride infusion  250 mL Intravenous PRN Pixie Casino, MD      . 0.9 %  sodium chloride infusion  250 mL Intravenous PRN Leonie Man, MD      . acetaminophen (TYLENOL) tablet 650 mg  650 mg Oral Q4H PRN Leonie Man, MD      . alum & mag hydroxide-simeth (MAALOX/MYLANTA) 200-200-20 MG/5ML suspension 30 mL  30 mL Oral Q6H PRN Pixie Casino, MD   30 mL at 01/20/15 1946  . aspirin EC tablet 81 mg  81 mg Oral Daily Pixie Casino, MD   81 mg at 01/22/15 1005  . enoxaparin (LOVENOX) injection 40 mg  40 mg Subcutaneous Q24H Leonie Man, MD   40 mg at 01/22/15 0825  . glipiZIDE (GLUCOTROL) tablet 5 mg  5 mg Oral BID AC Pixie Casino, MD   5 mg at 01/22/15 0825  . morphine 2 MG/ML injection 1 mg  1 mg Intravenous Q2H PRN Pixie Casino, MD      . ondansetron [facility] Dt) injection 4 mg  4 mg Intravenous Q6H PRN Leonie Man, MD   4 mg at 01/20/15 1700  . pantoprazole (PROTONIX) EC tablet 40 mg  40 mg Oral Daily Pixie Casino, MD   40 mg at 01/22/15 1005  . sodium chloride 0.9 % injection 3 mL  3 mL Intravenous Q12H Leonie Man, MD   3 mL at 01/21/15 2036  . sodium chloride 0.9 % injection 3 mL  3 mL Intravenous PRN Leonie Man, MD   3 mL at 01/21/15 0937    Filed Vitals:   01/21/15 1200 01/21/15 1253 01/21/15 2025 01/22/15 0400  BP: 103/58 105/61 100/66 116/69  Pulse: 67 64 76 68  Temp: 98.7 F (37.1 C) 99.3 F (37.4 C) 98.6 F (37 C) 98 F (36.7 C)  TempSrc: Oral Oral Oral Oral  Resp:  17 18 18 18   Height:  5\' 10"  (1.778 m)    Weight:  212 lb (96.163 kg)  209 lb (94.802 kg)  SpO2: 97% 98% 97% 99%    PHYSICAL EXAM General: NAD HEENT: Normal. Neck: No JVD, no thyromegaly.  Lungs: Clear to auscultation bilaterally with normal respiratory effort. CV: Nondisplaced PMI.  Regular rate and rhythm, normal S1/S2, no S3/S4, no murmur.  No pretibial edema.  No carotid bruit.  Normal pedal pulses.  Abdomen: Soft, nontender, no hepatosplenomegaly, no distention.  Neurologic: Alert and oriented x 3.  Psych: Normal affect. Musculoskeletal: Normal range of motion. No gross deformities. Extremities: No clubbing or cyanosis.     LABS: Basic Metabolic Panel:  Recent Labs  01/21/15 0407 01/22/15 0501  NA 137 137  K 4.5 4.7  CL 109 106  CO2 24 27  GLUCOSE 136* 175*  BUN 20 21  CREATININE 1.01 1.05  CALCIUM 8.2* 8.7   Liver Function Tests:  Recent Labs  01/21/15 0407 01/22/15 0501  AST 20 18  ALT 26 23  ALKPHOS 38* 37*  BILITOT 0.6 0.7  PROT 5.3* 5.7*  ALBUMIN 3.2* 3.4*    Recent Labs  01/20/15 1158 01/21/15 0407  LIPASE 74* 49  AMYLASE  --  109*   CBC:  Recent Labs  01/20/15 1158  01/21/15 0407 01/22/15 0501  WBC 10.5  < > 6.8 4.8  NEUTROABS 6.6  --   --   --   HGB 16.1  < > 13.7 13.8  HCT 46.6  < > 40.8 42.0  MCV 85.3  < > 83.8 85.4  PLT 213  < > 135* 124*  < > = values in this interval not displayed. Cardiac Enzymes:  Recent Labs  01/20/15 1158  TROPONINI <0.03   BNP: Invalid input(s): POCBNP D-Dimer:  Recent Labs  01/21/15 1221  DDIMER 0.43   Hemoglobin A1C: No results for input(s): HGBA1C in the last 72 hours. Fasting Lipid Panel:  Recent Labs  01/21/15 0407  CHOL 126  HDL 27*  LDLCALC 76  TRIG 115  CHOLHDL 4.7   Thyroid Function Tests: No results for input(s): TSH, T4TOTAL, T3FREE, THYROIDAB in the last 72 hours.  Invalid input(s): FREET3 Anemia Panel: No results for input(s): VITAMINB12, FOLATE, FERRITIN,  TIBC, IRON, RETICCTPCT in the last 72 hours.  RADIOLOGY: Dg Chest Portable 1 View  01/20/2015   CLINICAL DATA:  3 hr history of chest pain  EXAM: PORTABLE CHEST - 1 VIEW  COMPARISON:  January 25, 2009  FINDINGS: Degree of inspiration is shallow. There is no edema or consolidation. Heart is mildly enlarged with pulmonary vascularity within normal limits. No adenopathy. No pneumothorax. No bone lesions.  IMPRESSION: Mild cardiac enlargement.  No edema or consolidation.   Electronically Signed   By: Lowella Grip III M.D.   On: 01/20/2015 12:51   US Abdomen Limited Ruq  01/21/2015   CLINICAL DATA:  Epigastric pain  EXAM: US ABDOMEN LIMITED - RIGHT UPPER QUADRANT  COMPARISON:  CT scan 07/03/2009  FINDINGS: Gallbladder:  No gallstones or wall thickening visualized. No sonographic Murphy sign noted.  Common bile duct:  Diameter: 4 mm in diameter within normal limits.  Liver:  No focal lesion identified. Mild heterogeneous echotexture without intrahepatic biliary ductal dilatation.  IMPRESSION: Unremarkable right upper quadrant ultrasound.   Electronically Signed   By: Lahoma Crocker M.D.   On: 01/21/2015 18:12      ASSESSMENT AND PLAN: 1. ECG changes, epigastric pain, and hypotension: Resolved. Echo demonstrated vigorous LV systolic function, EF 12-24%, with mild apical and inferoapical hypokinesis and RV apical hypokinesis. Normal coronaries by cath. Unclear etiology for presentation. Lipase initially mildly elevated, then normalized. Question possible transient gallstone? No further workup necessary. 2. Thrombocytopenia: Very mild. Will f/u with PCP.  Dispo: Stable for discharge.  Kate Sable, M.D., F.A.C.C.

## 2015-01-22 NOTE — Discharge Instructions (Signed)
Radial Site Care °Refer to this sheet in the next few weeks. These instructions provide you with information on caring for yourself after your procedure. Your caregiver may also give you more specific instructions. Your treatment has been planned according to current medical practices, but problems sometimes occur. Call your caregiver if you have any problems or questions after your procedure. °HOME CARE INSTRUCTIONS °· You may shower the day after the procedure. Remove the bandage (dressing) and gently wash the site with plain soap and water. Gently pat the site dry. °· Do not apply powder or lotion to the site. °· Do not submerge the affected site in water for 3 to 5 days. °· Inspect the site at least twice daily. °· Do not flex or bend the affected arm for 24 hours. °· No lifting over 5 pounds (2.3 kg) for 5 days after your procedure. °· Do not drive home if you are discharged the same day of the procedure. Have someone else drive you. °· You may drive 24 hours after the procedure unless otherwise instructed by your caregiver. °· Do not operate machinery or power tools for 24 hours. °· A responsible adult should be with you for the first 24 hours after you arrive home. °What to expect: °· Any bruising will usually fade within 1 to 2 weeks. °· Blood that collects in the tissue (hematoma) may be painful to the touch. It should usually decrease in size and tenderness within 1 to 2 weeks. °SEEK IMMEDIATE MEDICAL CARE IF: °· You have unusual pain at the radial site. °· You have redness, warmth, swelling, or pain at the radial site. °· You have drainage (other than a small amount of blood on the dressing). °· You have chills. °· You have a fever or persistent symptoms for more than 72 hours. °· You have a fever and your symptoms suddenly get worse. °· Your arm becomes pale, cool, tingly, or numb. °· You have heavy bleeding from the site. Hold pressure on the site. °Document Released: 12/21/2010 Document Revised:  02/10/2012 Document Reviewed: 12/21/2010 °ExitCare® Patient Information ©2015 [employer]. This information is not intended to replace advice given to you by your health care provider. Make sure you discuss any questions you have with your health care provider. ° °

## 2015-06-27 ENCOUNTER — Other Ambulatory Visit: Payer: Self-pay | Admitting: Physician Assistant

## 2015-12-19 ENCOUNTER — Other Ambulatory Visit: Payer: Self-pay | Admitting: Physician Assistant

## 2016-09-01 ENCOUNTER — Encounter (HOSPITAL_COMMUNITY): Payer: Self-pay | Admitting: Emergency Medicine

## 2016-09-01 ENCOUNTER — Emergency Department (HOSPITAL_COMMUNITY)
Admission: EM | Admit: 2016-09-01 | Discharge: 2016-09-01 | Disposition: A | Discharge status: Home / Self Care | Payer: 59 | Attending: Emergency Medicine | Admitting: Emergency Medicine

## 2016-09-01 DIAGNOSIS — I10 Essential (primary) hypertension: Secondary | ICD-10-CM | POA: Insufficient documentation

## 2016-09-01 DIAGNOSIS — E119 Type 2 diabetes mellitus without complications: Secondary | ICD-10-CM | POA: Insufficient documentation

## 2016-09-01 DIAGNOSIS — Z79899 Other long term (current) drug therapy: Secondary | ICD-10-CM | POA: Insufficient documentation

## 2016-09-01 DIAGNOSIS — Z7982 Long term (current) use of aspirin: Secondary | ICD-10-CM | POA: Diagnosis not present

## 2016-09-01 DIAGNOSIS — Z7984 Long term (current) use of oral hypoglycemic drugs: Secondary | ICD-10-CM | POA: Insufficient documentation

## 2016-09-01 DIAGNOSIS — T7840XA Allergy, unspecified, initial encounter: Secondary | ICD-10-CM

## 2016-09-01 MED ORDER — DIPHENHYDRAMINE HCL 50 MG/ML IJ SOLN
50.0000 mg | Freq: Once | INTRAMUSCULAR | Status: AC
  Administered 2016-09-01: 50 mg via INTRAVENOUS

## 2016-09-01 MED ORDER — DIPHENHYDRAMINE HCL 50 MG/ML IJ SOLN
INTRAMUSCULAR | Status: AC
  Administered 2016-09-01: 50 mg via INTRAVENOUS
  Filled 2016-09-01: qty 1

## 2016-09-01 MED ORDER — DIPHENHYDRAMINE HCL 25 MG PO TABS
25.0000 mg | ORAL_TABLET | Freq: Three times a day (TID) | ORAL | 0 refills | Status: DC
Start: 2016-09-01 — End: 2018-11-04

## 2016-09-01 MED ORDER — FAMOTIDINE 20 MG PO TABS: 20.0000 mg | ORAL_TABLET | Freq: Two times a day (BID) | ORAL | 0 refills | Status: DC

## 2016-09-01 MED ORDER — EPINEPHRINE 0.3 MG/0.3ML IJ SOAJ: 0.3000 mg | Freq: Once | INTRAMUSCULAR | 1 refills | Status: AC

## 2016-09-01 MED ORDER — FAMOTIDINE IN NACL 20-0.9 MG/50ML-% IV SOLN
20.0000 mg | Freq: Once | INTRAVENOUS | Status: AC
  Administered 2016-09-01: 20 mg via INTRAVENOUS

## 2016-09-01 MED ORDER — FAMOTIDINE IN NACL 20-0.9 MG/50ML-% IV SOLN
INTRAVENOUS | Status: AC
  Administered 2016-09-01: 20 mg via INTRAVENOUS
  Filled 2016-09-01: qty 50

## 2016-09-01 MED ORDER — PREDNISONE 20 MG PO TABS: 40.0000 mg | ORAL_TABLET | Freq: Every day | ORAL | 0 refills | Status: DC

## 2016-09-01 MED ORDER — EPINEPHRINE 0.3 MG/0.3ML IJ SOAJ
INTRAMUSCULAR | Status: AC
  Administered 2016-09-01: 0.3 mg via INTRAMUSCULAR
  Filled 2016-09-01: qty 0.3

## 2016-09-01 MED ORDER — METHYLPREDNISOLONE SODIUM SUCC 125 MG IJ SOLR
INTRAMUSCULAR | Status: AC
  Administered 2016-09-01: 125 mg via INTRAVENOUS
  Filled 2016-09-01: qty 2

## 2016-09-01 MED ORDER — EPINEPHRINE 0.3 MG/0.3ML IJ SOAJ
0.3000 mg | Freq: Once | INTRAMUSCULAR | Status: AC
  Administered 2016-09-01: 0.3 mg via INTRAMUSCULAR

## 2016-09-01 MED ORDER — METHYLPREDNISOLONE SODIUM SUCC 125 MG IJ SOLR
125.0000 mg | Freq: Once | INTRAMUSCULAR | Status: AC
  Administered 2016-09-01: 125 mg via INTRAVENOUS

## 2016-09-01 NOTE — ED Notes (Signed)
20 gauge iv removed from right forearm, pt tolerated well, cath remains intact,

## 2016-09-01 NOTE — ED Triage Notes (Addendum)
PT was at [place] this afternoon and started breaking out in rash, red in face, feels like throat is tight.  Airway patent at this time, speaking in full sentences at this time 94% ra. Pt used epi pen but states that it was 65 years old.

## 2016-09-01 NOTE — ED Notes (Signed)
States he is feeling better.
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
## 2017-06-05 ENCOUNTER — Encounter: Payer: Self-pay | Admitting: Internal Medicine

## 2017-07-01 ENCOUNTER — Other Ambulatory Visit: Payer: Self-pay | Admitting: Physician Assistant

## 2017-07-01 DIAGNOSIS — C439 Malignant melanoma of skin, unspecified: Secondary | ICD-10-CM

## 2017-07-01 HISTORY — DX: Malignant melanoma of skin, unspecified: C43.9

## 2017-08-18 ENCOUNTER — Encounter (HOSPITAL_COMMUNITY): Payer: Self-pay | Admitting: *Deleted

## 2017-08-18 ENCOUNTER — Emergency Department (HOSPITAL_COMMUNITY)
Admission: EM | Admit: 2017-08-18 | Discharge: 2017-08-18 | Disposition: A | Discharge status: Home / Self Care | Payer: 59 | Attending: Emergency Medicine | Admitting: Emergency Medicine

## 2017-08-18 DIAGNOSIS — L299 Pruritus, unspecified: Secondary | ICD-10-CM | POA: Diagnosis present

## 2017-08-18 DIAGNOSIS — Z7984 Long term (current) use of oral hypoglycemic drugs: Secondary | ICD-10-CM | POA: Diagnosis not present

## 2017-08-18 DIAGNOSIS — E119 Type 2 diabetes mellitus without complications: Secondary | ICD-10-CM | POA: Diagnosis not present

## 2017-08-18 DIAGNOSIS — Z7982 Long term (current) use of aspirin: Secondary | ICD-10-CM | POA: Diagnosis not present

## 2017-08-18 DIAGNOSIS — T7840XA Allergy, unspecified, initial encounter: Secondary | ICD-10-CM | POA: Diagnosis not present

## 2017-08-18 DIAGNOSIS — I10 Essential (primary) hypertension: Secondary | ICD-10-CM | POA: Insufficient documentation

## 2017-08-18 DIAGNOSIS — Z79899 Other long term (current) drug therapy: Secondary | ICD-10-CM | POA: Insufficient documentation

## 2017-08-18 MED ORDER — FAMOTIDINE IN NACL 20-0.9 MG/50ML-% IV SOLN
INTRAVENOUS | Status: AC
  Filled 2017-08-18: qty 50

## 2017-08-18 MED ORDER — DIPHENHYDRAMINE HCL 50 MG/ML IJ SOLN
25.0000 mg | Freq: Once | INTRAMUSCULAR | Status: AC
  Administered 2017-08-18: 25 mg via INTRAVENOUS

## 2017-08-18 MED ORDER — DIPHENHYDRAMINE HCL 50 MG/ML IJ SOLN
INTRAMUSCULAR | Status: AC
  Filled 2017-08-18: qty 1

## 2017-08-18 MED ORDER — PREDNISONE 50 MG PO TABS
60.0000 mg | ORAL_TABLET | Freq: Once | ORAL | Status: AC
  Administered 2017-08-18: 60 mg via ORAL
  Filled 2017-08-18: qty 1

## 2017-08-18 MED ORDER — FAMOTIDINE 20 MG PO TABS
20.0000 mg | ORAL_TABLET | Freq: Once | ORAL | Status: AC
  Administered 2017-08-18: 20 mg via ORAL
  Filled 2017-08-18: qty 1

## 2017-08-18 MED ORDER — METHYLPREDNISOLONE SODIUM SUCC 125 MG IJ SOLR
INTRAMUSCULAR | Status: AC
  Filled 2017-08-18: qty 2

## 2017-08-18 MED ORDER — FAMOTIDINE 20 MG PO TABS: 20.0000 mg | ORAL_TABLET | Freq: Every day | ORAL | 0 refills | Status: DC

## 2017-08-18 MED ORDER — DIPHENHYDRAMINE HCL 25 MG PO CAPS: 25.0000 mg | ORAL_CAPSULE | Freq: Four times a day (QID) | ORAL | 0 refills | Status: DC | PRN

## 2017-08-18 MED ORDER — PREDNISONE 20 MG PO TABS
40.0000 mg | ORAL_TABLET | Freq: Every day | ORAL | 0 refills | Status: DC
Start: 2017-08-18 — End: 2018-11-04

## 2017-08-18 NOTE — Discharge Instructions (Signed)
You were spaced every itching.You self administered your Epipen.  Take prednisone and Benadryl over the next 4 days as needed. If you develop shortness breath, vomiting, sore throat you should be reevaluated immediately.

## 2017-08-18 NOTE — ED Notes (Signed)
Pt says he used epi pen before come to the ER.
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
## 2017-08-18 NOTE — ED Triage Notes (Signed)
Pt states he went to bed fine & woke up itching. Pt says he has had this happen several times.

## 2017-08-18 NOTE — ED Notes (Signed)
Pt alert & oriented x4, stable gait. Patient  given discharge instructions, paperwork & prescription(s). Patient verbalized understanding. Pt left department w/ no further questions. 

## 2017-10-16 ENCOUNTER — Other Ambulatory Visit: Payer: Self-pay | Admitting: Physician Assistant

## 2018-09-24 ENCOUNTER — Other Ambulatory Visit: Payer: Self-pay | Admitting: Physician Assistant
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
## 2018-11-04 ENCOUNTER — Ambulatory Visit: Payer: 59 | Admitting: Cardiology

## 2018-11-04 ENCOUNTER — Encounter: Payer: Self-pay | Admitting: *Deleted

## 2018-11-04 VITALS — BP 152/96 | HR 76 | Ht 68.0 in | Wt 214.0 lb

## 2018-11-04 DIAGNOSIS — I10 Essential (primary) hypertension: Secondary | ICD-10-CM

## 2018-11-04 DIAGNOSIS — R931 Abnormal findings on diagnostic imaging of heart and coronary circulation: Secondary | ICD-10-CM | POA: Diagnosis not present

## 2018-11-04 DIAGNOSIS — I499 Cardiac arrhythmia, unspecified: Secondary | ICD-10-CM

## 2018-11-04 NOTE — Patient Instructions (Signed)
Medication Instructions:  STOP- Aspirin 325 mg  START- Aspirin 81 mg daily  If you need a refill on your cardiac medications before your next appointment, please call your pharmacy.  Labwork: None Ordered   If you have labs (blood work) drawn today and your tests are completely normal, you will receive your results only by: Marland Kitchen MyChart Message (if you have MyChart) OR . A paper copy in the mail If you have any lab test that is abnormal or we need to change your treatment, we will call you to review the results.  Testing/Procedures: Your physician has requested that you have an echocardiogram. Echocardiography is a painless test that uses sound waves to create images of your heart. It provides your doctor with information about the size and shape of your heart and how well your heart's chambers and valves are working. This procedure takes approximately one hour. There are no restrictions for this procedure.  Follow-Up: . You will need a follow up appointment in 1 Month.    At [facility], you and your health needs are our priority.  As part of our continuing mission to provide you with exceptional heart care, we have created designated Provider Care Teams.  These Care Teams include your primary Cardiologist (physician) and Advanced Practice Providers (APPs -  Physician Assistants and Nurse Practitioners) who all work together to provide you with the care you need, when you need it.   Thank you for choosing [facility] at [facility]!!

## 2018-11-05 NOTE — Addendum Note (Signed)
Addended by: Lamar Laundry on: 11/05/2018 03:16 PM   Modules accepted: Orders

## 2018-11-09 ENCOUNTER — Ambulatory Visit (HOSPITAL_COMMUNITY)
Admission: RE | Admit: 2018-11-09 | Discharge: 2018-11-09 | Disposition: A | Discharge status: Home / Self Care | Payer: 59 | Source: Ambulatory Visit | Attending: Cardiology | Admitting: Cardiology

## 2018-11-09 DIAGNOSIS — R931 Abnormal findings on diagnostic imaging of heart and coronary circulation: Secondary | ICD-10-CM | POA: Diagnosis not present

## 2018-11-09 NOTE — Progress Notes (Signed)
*  PRELIMINARY RESULTS* Echocardiogram 2D Echocardiogram has been performed.  Samuel Germany 11/09/2018, 2:36 PM

## 2018-11-11 ENCOUNTER — Encounter: Payer: Self-pay | Admitting: Cardiology

## 2018-11-30 MED ORDER — AMLODIPINE BESYLATE 5 MG PO TABS: 7.5000 mg | ORAL_TABLET | Freq: Every day | ORAL | 3 refills | Status: DC

## 2018-11-30 NOTE — Telephone Encounter (Signed)
Amlodipine increase 7.5 mg daily #45 3 refills was send into pt [place].Marland Kitchen
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
## 2018-12-07 ENCOUNTER — Encounter: Payer: Self-pay | Admitting: Cardiology

## 2018-12-07 ENCOUNTER — Ambulatory Visit: Payer: 59 | Admitting: Cardiology

## 2018-12-07 VITALS — BP 156/82 | HR 74 | Ht 68.0 in | Wt 215.6 lb

## 2018-12-07 DIAGNOSIS — I10 Essential (primary) hypertension: Secondary | ICD-10-CM | POA: Diagnosis not present

## 2018-12-07 DIAGNOSIS — R002 Palpitations: Secondary | ICD-10-CM

## 2018-12-07 MED ORDER — OLMESARTAN MEDOXOMIL-HCTZ 40-25 MG PO TABS: 1.0000 | ORAL_TABLET | Freq: Every day | ORAL | 3 refills | Status: DC

## 2018-12-07 NOTE — Patient Instructions (Signed)
Medication Instructions:  START- Olmesartan/HCTz 40/25 mg daily  If you need a refill on your cardiac medications before your next appointment, please call your pharmacy.   Lab work: TSH Today  If you have labs (blood work) drawn today and your tests are completely normal, you will receive your results only by: Marland Kitchen MyChart Message (if you have MyChart) OR . A paper copy in the mail If you have any lab test that is abnormal or we need to change your treatment, we will call you to review the results.  Testing/Procedures: None Ordered  Follow-Up: At [facility], you and your health needs are our priority.  As part of our continuing mission to provide you with exceptional heart care, we have created designated Provider Care Teams.  These Care Teams include your primary Cardiologist (physician) and Advanced Practice Providers (APPs -  Physician Assistants and Nurse Practitioners) who all work together to provide you with the care you need, when you need it. You will need a follow up appointment in 1 months.  Please call our office 2 months in advance to schedule this appointment.  You may see Dr Percival Spanish or one of the following Advanced Practice Providers on your designated Care Team:   Rosaria Ferries, PA-C . Jory Sims, DNP, ANP

## 2018-12-08 LAB — TSH: TSH: 1.72 u[IU]/mL (ref 0.450–4.500)

## 2018-12-10 MED ORDER — NEBIVOLOL HCL 20 MG PO TABS: 20.0000 mg | ORAL_TABLET | Freq: Every day | ORAL | 11 refills | Status: DC

## 2018-12-10 NOTE — Telephone Encounter (Signed)
RE: Visit Follow-Up Question  Minus Breeding, MD  Sent: Wed December 09, 2018 10:17 PM  To: Fidel Levy, RN      Message   Increase Bystolic to 20 mg po daily. Disp number 30 with 11 refills.

## 2018-12-15 ENCOUNTER — Telehealth: Payer: Self-pay | Admitting: Cardiology

## 2018-12-15 NOTE — Telephone Encounter (Signed)
° ° °  STAT if HR is under 50 or over 120 (normal HR is 60-100 beats per minute)  1) What is your heart rate? 35  2) Do you have a log of your heart rate readings (document readings)? 33  3) Do you have any other symptoms? 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

## 2018-12-15 NOTE — Telephone Encounter (Signed)
Called patient back regarding his HR. Patient states that he feels fine, denies chest pains, sob, only states he feels tired. 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 I made an appointment for tomorrow the NP.   Spoke with DOD Dr.[place] regarding heart rate, although patient has taken all this medications today, she suggested he hold his Bystolic tomorrow, until being seen.   Patient verbalized understanding of both appointment and medication change.

## 2018-12-16 ENCOUNTER — Encounter: Payer: Self-pay | Admitting: Adult Health

## 2018-12-16 ENCOUNTER — Ambulatory Visit: Payer: 59 | Admitting: Adult Health

## 2018-12-16 VITALS — BP 136/72 | HR 60 | Ht 68.0 in | Wt 204.7 lb

## 2018-12-16 DIAGNOSIS — I10 Essential (primary) hypertension: Secondary | ICD-10-CM | POA: Diagnosis not present

## 2018-12-16 DIAGNOSIS — I499 Cardiac arrhythmia, unspecified: Secondary | ICD-10-CM

## 2018-12-16 NOTE — Patient Instructions (Signed)
Follow-Up: You will need a follow up appointment [place]. You may see Minus Breeding, MD Jory Sims, DNP, [place] or one of the following Advanced Practice Providers on your designated Care Team:  Jory Sims, DNP, AACC   Rosaria Ferries, PA-C  Medication Instructions:  NO CHANGES- Your physician recommends that you continue on your current medications as directed. Please refer to the Current Medication list given to you today. If you need a refill on your cardiac medications before your next appointment, please call your pharmacy. Labwork: When you have labs (blood work) and your tests are completely normal, you will receive your results ONLY by [place] (if you have MyChart) -OR- A paper copy in the mail. At [facility], you and your health needs are our priority.  As part of our continuing mission to provide you with exceptional heart care, we have created designated Provider Care Teams.  These Care Teams include your primary Cardiologist (physician) and Advanced Practice Providers (APPs -  Physician Assistants and Nurse Practitioners) who all work together to provide you with the care you need, when you need it.  Thank you for choosing [facility] at [facility]!!
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
## 2018-12-17 ENCOUNTER — Telehealth: Payer: Self-pay

## 2018-12-17 NOTE — Telephone Encounter (Signed)
PT WIFE CALLED BACK, SHE STATES THAT SHE WAS TOLD BY THE "DOT MD" THAT HE WILL NOT ACCEPT KATHRYN LAWRENCE DNP,AACC WRITTEN NOTE AND HE "NEEDS EVERYTHING SENT TO HIM" BEFORE HE CAN CLEAR PT TO DRIVE AGAIN. WIFE WILL CALL BACK WITH HIS NAME, LOCATION AND PHONE AND FAX NUMBERS. WILL AWAIT CALL BACK TO SEND THESE.

## 2018-12-17 NOTE — Telephone Encounter (Signed)
Follow up  Pts  Wife is calling with the needed info   Dr Leisa Lenz  Fax [phone]  Phone [phone]

## 2018-12-21 NOTE — Telephone Encounter (Signed)
FORWARDED TO DR GUARINO-I HAVE NOTED THAT HE HAS ALREADY BEEN FORWARDED ECHO IN December Notes recorded by Minus Breeding, MD on 11/15/2018 at 11:12 AM EST:  NL EF.   Mild aortic sclerosis-No significant abnormalities Call Mr. Clymer with the results and send results to Celene Squibb, MD
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
## 2019-01-14 ENCOUNTER — Ambulatory Visit: Payer: 59 | Admitting: Cardiology

## 2019-01-14 ENCOUNTER — Encounter: Payer: Self-pay | Admitting: Cardiology

## 2019-01-14 VITALS — BP 152/86 | HR 59 | Ht 68.0 in | Wt 208.2 lb

## 2019-01-14 DIAGNOSIS — I10 Essential (primary) hypertension: Secondary | ICD-10-CM | POA: Diagnosis not present

## 2019-01-14 DIAGNOSIS — R002 Palpitations: Secondary | ICD-10-CM

## 2019-01-14 NOTE — Patient Instructions (Signed)
Medication Instructions:  Continue current medications  If you need a refill on your cardiac medications before your next appointment, please call your pharmacy.  Labwork: None Ordered   Take the provided lab slips with you to the lab for your blood draw.   When you have your labs (blood work) drawn today and your tests are completely normal, you will receive your results only by MyChart Message (if you have MyChart) -OR-  A paper copy in the mail.  If you have any lab test that is abnormal or we need to change your treatment, we will call you to review these results.  Testing/Procedures: None Ordered  Follow-Up: . Your physician recommends that you schedule a follow-up appointment in: As Needed   At [facility], you and your health needs are our priority.  As part of our continuing mission to provide you with exceptional heart care, we have created designated Provider Care Teams.  These Care Teams include your primary Cardiologist (physician) and Advanced Practice Providers (APPs -  Physician Assistants and Nurse Practitioners) who all work together to provide you with the care you need, when you need it.  Thank you for choosing [facility] at [facility]!!

## 2019-10-19 ENCOUNTER — Other Ambulatory Visit: Payer: Self-pay | Admitting: Cardiology

## 2019-10-21 NOTE — Telephone Encounter (Signed)
Rx(s) sent to pharmacy electronically.  

## 2019-12-22 ENCOUNTER — Other Ambulatory Visit: Payer: Self-pay | Admitting: Cardiology

## 2020-03-16 ENCOUNTER — Other Ambulatory Visit: Payer: Self-pay | Admitting: Cardiology

## 2020-07-04 ENCOUNTER — Ambulatory Visit: Payer: 59 | Admitting: Physician Assistant

## 2020-07-05 ENCOUNTER — Ambulatory Visit: Payer: 59 | Admitting: Physician Assistant

## 2020-08-02 ENCOUNTER — Encounter (HOSPITAL_COMMUNITY): Payer: Self-pay

## 2020-08-02 ENCOUNTER — Other Ambulatory Visit: Payer: Self-pay

## 2020-08-02 ENCOUNTER — Emergency Department (HOSPITAL_COMMUNITY): Payer: 59

## 2020-08-02 ENCOUNTER — Inpatient Hospital Stay (HOSPITAL_COMMUNITY)
Admission: EM | Admit: 2020-08-02 | Discharge: 2020-08-04 | DRG: 177 | Disposition: A | Discharge status: Home / Self Care | Payer: 59 | Attending: Family Medicine | Admitting: Family Medicine

## 2020-08-02 DIAGNOSIS — D72819 Decreased white blood cell count, unspecified: Secondary | ICD-10-CM | POA: Diagnosis present

## 2020-08-02 DIAGNOSIS — J1282 Pneumonia due to coronavirus disease 2019: Secondary | ICD-10-CM | POA: Diagnosis present

## 2020-08-02 DIAGNOSIS — Z91012 Allergy to eggs: Secondary | ICD-10-CM

## 2020-08-02 DIAGNOSIS — L219 Seborrheic dermatitis, unspecified: Secondary | ICD-10-CM | POA: Diagnosis present

## 2020-08-02 DIAGNOSIS — E119 Type 2 diabetes mellitus without complications: Secondary | ICD-10-CM | POA: Diagnosis present

## 2020-08-02 DIAGNOSIS — Z79899 Other long term (current) drug therapy: Secondary | ICD-10-CM | POA: Diagnosis not present

## 2020-08-02 DIAGNOSIS — Z7982 Long term (current) use of aspirin: Secondary | ICD-10-CM | POA: Diagnosis not present

## 2020-08-02 DIAGNOSIS — Z7984 Long term (current) use of oral hypoglycemic drugs: Secondary | ICD-10-CM | POA: Diagnosis not present

## 2020-08-02 DIAGNOSIS — U071 COVID-19: Secondary | ICD-10-CM | POA: Diagnosis present

## 2020-08-02 DIAGNOSIS — E7800 Pure hypercholesterolemia, unspecified: Secondary | ICD-10-CM | POA: Diagnosis present

## 2020-08-02 DIAGNOSIS — R0902 Hypoxemia: Secondary | ICD-10-CM | POA: Diagnosis present

## 2020-08-02 DIAGNOSIS — R7401 Elevation of levels of liver transaminase levels: Secondary | ICD-10-CM | POA: Diagnosis present

## 2020-08-02 DIAGNOSIS — Z833 Family history of diabetes mellitus: Secondary | ICD-10-CM | POA: Diagnosis not present

## 2020-08-02 DIAGNOSIS — I10 Essential (primary) hypertension: Secondary | ICD-10-CM | POA: Diagnosis present

## 2020-08-02 DIAGNOSIS — K219 Gastro-esophageal reflux disease without esophagitis: Secondary | ICD-10-CM | POA: Diagnosis present

## 2020-08-02 DIAGNOSIS — E782 Mixed hyperlipidemia: Secondary | ICD-10-CM | POA: Diagnosis present

## 2020-08-02 DIAGNOSIS — Z85820 Personal history of malignant melanoma of skin: Secondary | ICD-10-CM

## 2020-08-02 DIAGNOSIS — Z8249 Family history of ischemic heart disease and other diseases of the circulatory system: Secondary | ICD-10-CM | POA: Diagnosis not present

## 2020-08-02 DIAGNOSIS — D696 Thrombocytopenia, unspecified: Secondary | ICD-10-CM | POA: Diagnosis present

## 2020-08-02 DIAGNOSIS — B349 Viral infection, unspecified: Secondary | ICD-10-CM | POA: Diagnosis present

## 2020-08-02 DIAGNOSIS — F5101 Primary insomnia: Secondary | ICD-10-CM | POA: Diagnosis present

## 2020-08-02 DIAGNOSIS — E1169 Type 2 diabetes mellitus with other specified complication: Secondary | ICD-10-CM

## 2020-08-02 LAB — CBC WITH DIFFERENTIAL/PLATELET
Abs Immature Granulocytes: 0.02 10*3/uL (ref 0.00–0.07)
Basophils Absolute: 0 10*3/uL (ref 0.0–0.1)
Basophils Relative: 0 %
Eosinophils Absolute: 0 10*3/uL (ref 0.0–0.5)
Eosinophils Relative: 0 %
HCT: 42.7 % (ref 39.0–52.0)
Hemoglobin: 14.4 g/dL (ref 13.0–17.0)
Immature Granulocytes: 1 %
Lymphocytes Relative: 27 %
Lymphs Abs: 0.7 10*3/uL (ref 0.7–4.0)
MCH: 29.8 pg (ref 26.0–34.0)
MCHC: 33.7 g/dL (ref 30.0–36.0)
MCV: 88.4 fL (ref 80.0–100.0)
Monocytes Absolute: 0.3 10*3/uL (ref 0.1–1.0)
Monocytes Relative: 10 %
Neutro Abs: 1.6 10*3/uL — ABNORMAL LOW (ref 1.7–7.7)
Neutrophils Relative %: 62 %
Platelets: 86 10*3/uL — ABNORMAL LOW (ref 150–400)
RBC: 4.83 MIL/uL (ref 4.22–5.81)
RDW: 13.2 % (ref 11.5–15.5)
WBC: 2.6 10*3/uL — ABNORMAL LOW (ref 4.0–10.5)
nRBC: 0 % (ref 0.0–0.2)

## 2020-08-02 LAB — PHOSPHORUS: Phosphorus: 3.5 mg/dL (ref 2.5–4.6)

## 2020-08-02 LAB — TRIGLYCERIDES: Triglycerides: 129 mg/dL (ref <150)

## 2020-08-02 LAB — COMPREHENSIVE METABOLIC PANEL
ALT: 64 U/L — ABNORMAL HIGH (ref 0–44)
AST: 60 U/L — ABNORMAL HIGH (ref 15–41)
Albumin: 3.7 g/dL (ref 3.5–5.0)
Alkaline Phosphatase: 38 U/L (ref 38–126)
Anion gap: 13 (ref 5–15)
BUN: 21 mg/dL (ref 8–23)
CO2: 21 mmol/L — ABNORMAL LOW (ref 22–32)
Calcium: 8.2 mg/dL — ABNORMAL LOW (ref 8.9–10.3)
Chloride: 97 mmol/L — ABNORMAL LOW (ref 98–111)
Creatinine, Ser: 0.97 mg/dL (ref 0.61–1.24)
GFR calc Af Amer: >60 mL/min (ref >60)
GFR calc non Af Amer: >60 mL/min (ref >60)
Glucose, Bld: 180 mg/dL — ABNORMAL HIGH (ref 70–99)
Potassium: 4.1 mmol/L (ref 3.5–5.1)
Sodium: 131 mmol/L — ABNORMAL LOW (ref 135–145)
Total Bilirubin: 0.8 mg/dL (ref 0.3–1.2)
Total Protein: 7 g/dL (ref 6.5–8.1)

## 2020-08-02 LAB — FIBRINOGEN: Fibrinogen: 591 mg/dL — ABNORMAL HIGH (ref 210–475)

## 2020-08-02 LAB — CBG MONITORING, ED: Glucose-Capillary: 239 mg/dL — ABNORMAL HIGH (ref 70–99)

## 2020-08-02 LAB — LACTIC ACID, PLASMA
Lactic Acid, Venous: 2.3 mmol/L (ref 0.5–1.9)
Lactic Acid, Venous: 1.7 mmol/L (ref 0.5–1.9)

## 2020-08-02 LAB — C-REACTIVE PROTEIN: CRP: 10.9 mg/dL — ABNORMAL HIGH (ref <1.0)

## 2020-08-02 LAB — FERRITIN: Ferritin: 1047 ng/mL — ABNORMAL HIGH (ref 24–336)

## 2020-08-02 LAB — SARS CORONAVIRUS 2 BY RT PCR (HOSPITAL ORDER, PERFORMED IN ~~LOC~~ HOSPITAL LAB): SARS Coronavirus 2: POSITIVE — AB

## 2020-08-02 LAB — LACTATE DEHYDROGENASE: LDH: 324 U/L — ABNORMAL HIGH (ref 98–192)

## 2020-08-02 LAB — MAGNESIUM: Magnesium: 1.9 mg/dL (ref 1.7–2.4)

## 2020-08-02 LAB — PROCALCITONIN: Procalcitonin: <0.1 ng/mL

## 2020-08-02 LAB — D-DIMER, QUANTITATIVE: D-Dimer, Quant: 0.91 ug/mL-FEU — ABNORMAL HIGH (ref 0.00–0.50)

## 2020-08-02 MED ORDER — ALBUTEROL SULFATE HFA 108 (90 BASE) MCG/ACT IN AERS
4.0000 | INHALATION_SPRAY | Freq: Once | RESPIRATORY_TRACT | Status: AC
  Administered 2020-08-02: 4 via RESPIRATORY_TRACT
  Filled 2020-08-02: qty 6.7

## 2020-08-02 MED ORDER — ALBUTEROL SULFATE HFA 108 (90 BASE) MCG/ACT IN AERS: 2.0000 | INHALATION_SPRAY | Freq: Once | RESPIRATORY_TRACT | Status: DC | PRN

## 2020-08-02 MED ORDER — ASCORBIC ACID 500 MG PO TABS
500.0000 mg | ORAL_TABLET | Freq: Every day | ORAL | Status: DC
  Administered 2020-08-02 – 2020-08-03 (×2): 500 mg via ORAL
  Filled 2020-08-02 (×2): qty 1

## 2020-08-02 MED ORDER — ENOXAPARIN SODIUM 40 MG/0.4ML ~~LOC~~ SOLN
40.0000 mg | SUBCUTANEOUS | Status: DC
  Administered 2020-08-02: 40 mg via SUBCUTANEOUS
  Filled 2020-08-02: qty 0.4

## 2020-08-02 MED ORDER — SODIUM CHLORIDE 0.9 % IV SOLN: 200.0000 mg | Freq: Once | INTRAVENOUS | Status: DC

## 2020-08-02 MED ORDER — ACETAMINOPHEN 650 MG RE SUPP: 650.0000 mg | Freq: Four times a day (QID) | RECTAL | Status: DC | PRN

## 2020-08-02 MED ORDER — IPRATROPIUM BROMIDE HFA 17 MCG/ACT IN AERS
2.0000 | INHALATION_SPRAY | Freq: Four times a day (QID) | RESPIRATORY_TRACT | Status: DC
  Filled 2020-08-02: qty 12.9

## 2020-08-02 MED ORDER — SODIUM CHLORIDE 0.9 % IV SOLN: 1200.0000 mg | Freq: Once | INTRAVENOUS | Status: DC

## 2020-08-02 MED ORDER — LACTATED RINGERS IV BOLUS (SEPSIS)
1000.0000 mL | Freq: Once | INTRAVENOUS | Status: AC
  Administered 2020-08-02: 1000 mL via INTRAVENOUS

## 2020-08-02 MED ORDER — ACETAMINOPHEN 325 MG PO TABS: 650.0000 mg | ORAL_TABLET | Freq: Four times a day (QID) | ORAL | Status: DC | PRN

## 2020-08-02 MED ORDER — EPINEPHRINE 0.3 MG/0.3ML IJ SOAJ: 0.3000 mg | Freq: Once | INTRAMUSCULAR | Status: DC | PRN

## 2020-08-02 MED ORDER — ZINC SULFATE 220 (50 ZN) MG PO CAPS
220.0000 mg | ORAL_CAPSULE | Freq: Every day | ORAL | Status: DC
  Administered 2020-08-02 – 2020-08-03 (×2): 220 mg via ORAL
  Filled 2020-08-02 (×2): qty 1

## 2020-08-02 MED ORDER — METHYLPREDNISOLONE SODIUM SUCC 125 MG IJ SOLR: 125.0000 mg | Freq: Once | INTRAMUSCULAR | Status: DC | PRN

## 2020-08-02 MED ORDER — DIPHENHYDRAMINE HCL 50 MG/ML IJ SOLN: 50.0000 mg | Freq: Once | INTRAMUSCULAR | Status: DC | PRN

## 2020-08-02 MED ORDER — SODIUM CHLORIDE 0.9 % IV SOLN
100.0000 mg | Freq: Every day | INTRAVENOUS | Status: DC
  Administered 2020-08-03 – 2020-08-04 (×2): 100 mg via INTRAVENOUS
  Filled 2020-08-02 (×2): qty 20

## 2020-08-02 MED ORDER — LINAGLIPTIN 5 MG PO TABS
5.0000 mg | ORAL_TABLET | Freq: Every day | ORAL | Status: DC
  Filled 2020-08-02 (×2): qty 1

## 2020-08-02 MED ORDER — GUAIFENESIN-DM 100-10 MG/5ML PO SYRP
10.0000 mL | ORAL_SOLUTION | ORAL | Status: DC | PRN
  Administered 2020-08-02: 10 mL via ORAL
  Filled 2020-08-02: qty 10

## 2020-08-02 MED ORDER — DEXAMETHASONE SODIUM PHOSPHATE 10 MG/ML IJ SOLN
10.0000 mg | Freq: Once | INTRAMUSCULAR | Status: AC
  Administered 2020-08-02: 10 mg via INTRAVENOUS
  Filled 2020-08-02: qty 1

## 2020-08-02 MED ORDER — DEXAMETHASONE SODIUM PHOSPHATE 10 MG/ML IJ SOLN
6.0000 mg | INTRAMUSCULAR | Status: DC
  Administered 2020-08-03: 6 mg via INTRAVENOUS
  Filled 2020-08-02: qty 1

## 2020-08-02 MED ORDER — SODIUM CHLORIDE 0.9 % IV SOLN
100.0000 mg | INTRAVENOUS | Status: AC
  Administered 2020-08-02 (×2): 100 mg via INTRAVENOUS
  Filled 2020-08-02 (×2): qty 20

## 2020-08-02 MED ORDER — INSULIN DETEMIR 100 UNIT/ML ~~LOC~~ SOLN
0.1500 [IU]/kg | Freq: Two times a day (BID) | SUBCUTANEOUS | Status: DC
  Administered 2020-08-02 – 2020-08-04 (×4): 14 [IU] via SUBCUTANEOUS
  Filled 2020-08-02 (×10): qty 0.14

## 2020-08-02 MED ORDER — INSULIN ASPART 100 UNIT/ML ~~LOC~~ SOLN
0.0000 [IU] | SUBCUTANEOUS | Status: DC
  Administered 2020-08-02: 7 [IU] via SUBCUTANEOUS
  Administered 2020-08-03: 11 [IU] via SUBCUTANEOUS
  Filled 2020-08-02 (×2): qty 1

## 2020-08-02 MED ORDER — HYDROCOD POLST-CPM POLST ER 10-8 MG/5ML PO SUER
5.0000 mL | Freq: Two times a day (BID) | ORAL | Status: DC | PRN
  Administered 2020-08-04: 5 mL via ORAL
  Filled 2020-08-02: qty 5

## 2020-08-02 MED ORDER — SODIUM CHLORIDE 0.9 % IV SOLN: 100.0000 mg | Freq: Every day | INTRAVENOUS | Status: DC

## 2020-08-02 MED ORDER — FAMOTIDINE IN NACL 20-0.9 MG/50ML-% IV SOLN: 20.0000 mg | Freq: Once | INTRAVENOUS | Status: DC | PRN

## 2020-08-02 MED ORDER — SODIUM CHLORIDE 0.9 % IV SOLN: INTRAVENOUS | Status: DC | PRN

## 2020-08-02 NOTE — Progress Notes (Signed)
08/02/2020 2127  Based on current O2 requirement of 2L (86>>92%) and potential for admission for COVID related diagnosis, patient is not eligible for casirivimab / imdevimab.  Masin Shatto S. Alford Highland, PharmD, BCPS Clinical Staff Pharmacist [email or web address]

## 2020-08-02 NOTE — Progress Notes (Signed)
Noted consult for casirivimab / imdevimab. Discussed patient with Claiborne Billings RN (and Dr. Roderic Palau indirectly). Awaiting COVID test, further O2 sats, vaccination status, with additional patient information. Will f/u.  Overton Boggus S. Alford Highland, PharmD, BCPS Clinical Staff Pharmacist [email or web address]
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
## 2020-08-02 NOTE — ED Notes (Signed)
CRITICAL VALUE ALERT  Critical Value:  Lactic Acid 2.3  Date & Time Notied:  08/02/2020 2224  Provider Notified: Dr. Olevia Bowens  Orders Received/Actions taken: see chart

## 2020-08-02 NOTE — Progress Notes (Signed)
Took IS into patient's room.  Explained how to use and had patient demonstrate 10X.  Personal best was 735ml at this time.  Encouraged patient to continue to do every hour while awake atleast 10X.  Will continue to monitor.

## 2020-08-02 NOTE — ED Notes (Addendum)
Pt states 10 days ago he started with a cough. Last Wednesday tested at Dr. Juel Burrow office, tested COVID positive. Pt says he checks his oxygen saturation at home everyday and it was 83% on room air. Pt reports his only symptom at this time is a cough, no SOB/body aches/or fevers. Pt has not had vaccines. Saturations 93% at rest.

## 2020-08-02 NOTE — ED Triage Notes (Signed)
Pt presents to ED, Covid positive, states his oxygen level has been low at home. Pt states O2 at home got to 83%. Pt O2 in triage 94-95% Pt denies SOB.
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
## 2020-08-02 NOTE — ED Notes (Signed)
Pt ambulated to bedside commode, saturations 86% on RA

## 2020-08-03 DIAGNOSIS — E7800 Pure hypercholesterolemia, unspecified: Secondary | ICD-10-CM

## 2020-08-03 DIAGNOSIS — K219 Gastro-esophageal reflux disease without esophagitis: Secondary | ICD-10-CM

## 2020-08-03 DIAGNOSIS — D72819 Decreased white blood cell count, unspecified: Secondary | ICD-10-CM

## 2020-08-03 DIAGNOSIS — R7401 Elevation of levels of liver transaminase levels: Secondary | ICD-10-CM

## 2020-08-03 DIAGNOSIS — I10 Essential (primary) hypertension: Secondary | ICD-10-CM

## 2020-08-03 DIAGNOSIS — D696 Thrombocytopenia, unspecified: Secondary | ICD-10-CM

## 2020-08-03 LAB — CBC WITH DIFFERENTIAL/PLATELET
Abs Immature Granulocytes: 0.02 10*3/uL (ref 0.00–0.07)
Basophils Absolute: 0 10*3/uL (ref 0.0–0.1)
Basophils Relative: 0 %
Eosinophils Absolute: 0 10*3/uL (ref 0.0–0.5)
Eosinophils Relative: 0 %
HCT: 41.3 % (ref 39.0–52.0)
Hemoglobin: 13.7 g/dL (ref 13.0–17.0)
Immature Granulocytes: 1 %
Lymphocytes Relative: 19 %
Lymphs Abs: 0.4 10*3/uL — ABNORMAL LOW (ref 0.7–4.0)
MCH: 29.1 pg (ref 26.0–34.0)
MCHC: 33.2 g/dL (ref 30.0–36.0)
MCV: 87.7 fL (ref 80.0–100.0)
Monocytes Absolute: 0.2 10*3/uL (ref 0.1–1.0)
Monocytes Relative: 8 %
Neutro Abs: 1.4 10*3/uL — ABNORMAL LOW (ref 1.7–7.7)
Neutrophils Relative %: 72 %
Platelets: 94 10*3/uL — ABNORMAL LOW (ref 150–400)
RBC: 4.71 MIL/uL (ref 4.22–5.81)
RDW: 12.8 % (ref 11.5–15.5)
WBC: 2 10*3/uL — ABNORMAL LOW (ref 4.0–10.5)
nRBC: 0 % (ref 0.0–0.2)

## 2020-08-03 LAB — COMPREHENSIVE METABOLIC PANEL
ALT: 60 U/L — ABNORMAL HIGH (ref 0–44)
AST: 54 U/L — ABNORMAL HIGH (ref 15–41)
Albumin: 3.1 g/dL — ABNORMAL LOW (ref 3.5–5.0)
Alkaline Phosphatase: 38 U/L (ref 38–126)
Anion gap: 12 (ref 5–15)
BUN: 20 mg/dL (ref 8–23)
CO2: 19 mmol/L — ABNORMAL LOW (ref 22–32)
Calcium: 7.6 mg/dL — ABNORMAL LOW (ref 8.9–10.3)
Chloride: 99 mmol/L (ref 98–111)
Creatinine, Ser: 0.88 mg/dL (ref 0.61–1.24)
GFR calc Af Amer: >60 mL/min (ref >60)
GFR calc non Af Amer: >60 mL/min (ref >60)
Glucose, Bld: 267 mg/dL — ABNORMAL HIGH (ref 70–99)
Potassium: 4.2 mmol/L (ref 3.5–5.1)
Sodium: 130 mmol/L — ABNORMAL LOW (ref 135–145)
Total Bilirubin: 0.8 mg/dL (ref 0.3–1.2)
Total Protein: 6.4 g/dL — ABNORMAL LOW (ref 6.5–8.1)

## 2020-08-03 LAB — CBG MONITORING, ED
Glucose-Capillary: 256 mg/dL — ABNORMAL HIGH (ref 70–99)
Glucose-Capillary: 213 mg/dL — ABNORMAL HIGH (ref 70–99)
Glucose-Capillary: 194 mg/dL — ABNORMAL HIGH (ref 70–99)
Glucose-Capillary: 194 mg/dL — ABNORMAL HIGH (ref 70–99)

## 2020-08-03 LAB — MAGNESIUM: Magnesium: 1.8 mg/dL (ref 1.7–2.4)

## 2020-08-03 LAB — FERRITIN: Ferritin: 1132 ng/mL — ABNORMAL HIGH (ref 24–336)

## 2020-08-03 LAB — C-REACTIVE PROTEIN: CRP: 10.8 mg/dL — ABNORMAL HIGH (ref <1.0)

## 2020-08-03 LAB — ABO/RH: ABO/RH(D): B NEG

## 2020-08-03 LAB — PHOSPHORUS: Phosphorus: 3.7 mg/dL (ref 2.5–4.6)

## 2020-08-03 LAB — HIV ANTIBODY (ROUTINE TESTING W REFLEX): HIV Screen 4th Generation wRfx: NONREACTIVE

## 2020-08-03 LAB — D-DIMER, QUANTITATIVE: D-Dimer, Quant: 0.84 ug/mL-FEU — ABNORMAL HIGH (ref 0.00–0.50)

## 2020-08-03 MED ORDER — OXYCODONE HCL 5 MG PO TABS: 5.0000 mg | ORAL_TABLET | ORAL | Status: DC | PRN

## 2020-08-03 MED ORDER — ACETAMINOPHEN 650 MG RE SUPP: 650.0000 mg | Freq: Four times a day (QID) | RECTAL | Status: DC

## 2020-08-03 MED ORDER — SODIUM CHLORIDE 0.9 % IV SOLN: INTRAVENOUS | Status: DC

## 2020-08-03 MED ORDER — INSULIN ASPART 100 UNIT/ML ~~LOC~~ SOLN
0.0000 [IU] | Freq: Three times a day (TID) | SUBCUTANEOUS | Status: DC
  Administered 2020-08-03: 4 [IU] via SUBCUTANEOUS
  Administered 2020-08-03: 7 [IU] via SUBCUTANEOUS
  Administered 2020-08-04: 17 [IU] via SUBCUTANEOUS
  Filled 2020-08-03 (×2): qty 1

## 2020-08-03 MED ORDER — ACETAMINOPHEN 325 MG PO TABS
650.0000 mg | ORAL_TABLET | Freq: Four times a day (QID) | ORAL | Status: DC
  Administered 2020-08-03 – 2020-08-04 (×3): 650 mg via ORAL
  Filled 2020-08-03 (×3): qty 2

## 2020-08-03 MED ORDER — DOCUSATE SODIUM 100 MG PO CAPS
100.0000 mg | ORAL_CAPSULE | Freq: Every day | ORAL | Status: DC
  Administered 2020-08-03: 100 mg via ORAL
  Filled 2020-08-03: qty 1

## 2020-08-03 MED ORDER — INSULIN ASPART 100 UNIT/ML ~~LOC~~ SOLN
10.0000 [IU] | Freq: Three times a day (TID) | SUBCUTANEOUS | Status: DC
  Administered 2020-08-03 – 2020-08-04 (×3): 10 [IU] via SUBCUTANEOUS
  Filled 2020-08-03 (×2): qty 1

## 2020-08-03 MED ORDER — PANTOPRAZOLE SODIUM 40 MG PO TBEC
40.0000 mg | DELAYED_RELEASE_TABLET | Freq: Every day | ORAL | Status: DC
  Administered 2020-08-04: 40 mg via ORAL
  Filled 2020-08-03: qty 1

## 2020-08-03 NOTE — Progress Notes (Addendum)
PROGRESS NOTE   Tim Day  GHW:[id number] DOB: 05/23/1951 DOA: 08/02/2020 PCP: Celene Squibb, MD   Chief Complaint  Patient presents with  . Covid Positive    Brief Admission History:   69 y.o. 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  Assessment & Plan:   Principal Problem:   Pneumonia due to COVID-19 virus Active Problems:   DM2 (diabetes mellitus, type 2) (HCC)   Hypercholesteremia   Hypertension   Thrombocytopenia (HCC)   Leukopenia   Esophageal reflux   Hypocalcemia   Transaminitis  1. Covid pneumonia - Pt continues to have supplemental oxygen requirements.  He will be maintained on remdesivir, dexamethasone, bronchodilators, supportive care. Continue to follow inflammatory markers.  Ambulate as much as possible.  Further recommendations pending clinical course.  2. Type 2 diabetes mellitus - continue SSI coverage and frequent monitoring.  He is on levemir BID.   3. Thrombocytopenia - likely reactive, following closely.   4. Leukopenia - consistent with viral infection, follow.  5. Hypocalcemia - corrected calcium 8.2 with current albumin level.  6. GERD - add protonix for GI protection.   7. Essential hypertension - resuming home ARB. Follow.  8. Transaminitis - Following daily.    DVT prophylaxis:  SCDs Code Status:  Full  Family Communication:  Wife updated telephone Disposition:   Status is: Inpatient  Remains inpatient  appropriate because:IV treatments appropriate due to intensity of illness or inability to take PO and Inpatient level of care appropriate due to severity of illness  Dispo: The patient is from: Home              Anticipated d/c is to: Home              Anticipated d/c date is: 2 days              Patient currently is not medically stable to d/c. Consultants:   Procedures:     Antimicrobials:  Remdesivir 9/1>>   Subjective: Pt reports that he has persistent nonproductive cough and chest congestion.    Objective: Vitals:   08/03/20 0443 08/03/20 0600 08/03/20 0630 08/03/20 1023  BP:  112/70 106/74 120/71  Pulse: 83 79 81 88  Resp: (!) 24 20 (!) 21 20  Temp: 99 F (37.2 C)   99 F (37.2 C)  TempSrc: Oral   Oral  SpO2: 97% 98% 97% 91%  Weight:      Height:       No intake or output data in the 24 hours ending 08/03/20 1217 Filed Weights   08/02/20 1610  Weight: 90.7 kg   Examination:  General exam: Appears calm and comfortable  Respiratory system: Clear to auscultation. Respiratory effort normal. Cardiovascular system: S1 & S2 heard, RRR. No JVD, murmurs, rubs, gallops or clicks. No pedal edema. Gastrointestinal system: Abdomen is nondistended, soft and nontender. No organomegaly or masses felt. Normal bowel sounds heard. Central nervous system: Alert and oriented. No focal neurological deficits. Extremities: Symmetric 5 x 5 power. Skin: No rashes, lesions or ulcers Psychiatry: Judgement and insight  appear normal. Mood & affect appropriate.   Data Reviewed: I have personally reviewed following labs and imaging studies  CBC: Recent Labs  Lab 08/02/20 2026 08/03/20 0406  WBC 2.6* 2.0*  NEUTROABS 1.6* 1.4*  HGB 14.4 13.7  HCT 42.7 41.3  MCV 88.4 87.7  PLT 86* 94*    Basic Metabolic Panel: Recent Labs  Lab 08/02/20 2026 08/02/20 2207 08/03/20 0406  NA 131*  --  130*  K 4.1  --  4.2  CL 97*  --  99  CO2 21*  --  19*  GLUCOSE 180*  --  267*  BUN 21  --   20  CREATININE 0.97  --  0.88  CALCIUM 8.2*  --  7.6*  MG  --  1.9 1.8  PHOS  --  3.5 3.7    GFR: Estimated Creatinine Clearance: 92.3 mL/min (by C-G formula based on SCr of 0.88 mg/dL).  Liver Function Tests: Recent Labs  Lab 08/02/20 2026 08/03/20 0406  AST 60* 54*  ALT 64* 60*  ALKPHOS 38 38  BILITOT 0.8 0.8  PROT 7.0 6.4*  ALBUMIN 3.7 3.1*    CBG: Recent Labs  Lab 08/02/20 2336 08/03/20 0441 08/03/20 0821  GLUCAP 239* 256* 213*    Recent Results (from the past 240 hour(s))  SARS Coronavirus 2 by RT PCR (hospital order, performed in [place] hospital lab) Nasopharyngeal Nasopharyngeal Swab     Status: Abnormal   Collection Time: 08/02/20  8:48 PM   Specimen: Nasopharyngeal Swab  Result Value Ref Range Status   SARS Coronavirus 2 POSITIVE (A) NEGATIVE Final    Comment: CRITICAL RESULT CALLED TO, READ BACK BY AND VERIFIED WITH: GIBSON,RN AT 2210 ON 9.1.21 BY ISLEY,B (NOTE) SARS-CoV-2 target nucleic acids are DETECTED  SARS-CoV-2 RNA is generally detectable in upper respiratory specimens  during the acute phase of infection.  Positive results are indicative  of the presence of the identified virus, but do not rule out bacterial infection or co-infection with other pathogens not detected by the test.  Clinical correlation with patient history and  other diagnostic information is necessary to determine patient infection status.  The expected result is negative.  Fact Sheet for Patients:   StrictlyIdeas.no   Fact Sheet for Healthcare Providers:   BankingDealers.co.za    This test is not yet approved or cleared by the [place] FDA and  has been authorized for detection and/or diagnosis of SARS-CoV-2 by FDA under an Emergency Use Authorization (EUA).  This EUA will remain in effect (me aning this test can be used) for the duration of  the COVID-19 declaration under Section 564(b)(1) of the Act, 21 U.S.C.  section 360-bbb-3(b)(1), unless the authorization is terminated or revoked sooner.  Performed at [facility], [address] [place], [place] [address]   Blood Culture (routine x 2)     Status: None (Preliminary result)   Collection Time: 08/02/20 10:06 PM   Specimen: BLOOD  Result Value Ref Range Status   Specimen Description BLOOD RIGHT ANTECUBITAL  Final   Special Requests   Final    BOTTLES DRAWN AEROBIC AND ANAEROBIC Blood Culture adequate volume   Culture   Final    NO GROWTH < 12 HOURS Performed at [facility], [address] [place],  [address]    Report Status PENDING  Incomplete  Blood Culture (routine x 2)     Status: None (Preliminary result)   Collection Time: 08/02/20 10:24 PM   Specimen: BLOOD  Result  Value Ref Range Status   Specimen Description BLOOD RIGHT ANTECUBITAL  Final   Special Requests   Final    BOTTLES DRAWN AEROBIC AND ANAEROBIC Blood Culture adequate volume   Culture   Final    NO GROWTH < 12 HOURS Performed at [facility], [address], [place], [place] [address]    Report Status PENDING  Incomplete     Radiology Studies: DG Chest Portable 1 View  Result Date: 08/02/2020 CLINICAL DATA:  Dyspnea EXAM: PORTABLE CHEST 1 VIEW COMPARISON:  01/20/2015 FINDINGS: Lung volumes are small, but are symmetric. Pulmonary insufflation has improved since prior examination. Superimposed multifocal pulmonary infiltrates within the peripheral mid lung zones bilaterally are identified, most in keeping with atypical infection and compatible with the given history of COVID-19 pneumonia. There is no pneumothorax or pleural effusion. Cardiac size within normal limits. Probable acute left fourth rib fracture identified laterally. IMPRESSION: Pulmonary hypoinflation. Multifocal pulmonary infiltrates compatible with given history of COVID-19 pneumonia. Acute left fourth rib fracture.  No pneumothorax. Electronically Signed   By: Fidela Salisbury MD   On: 08/02/2020 17:14     Scheduled Meds: . acetaminophen  650 mg Oral Q6H   Or  . acetaminophen  650 mg Rectal Q6H  . vitamin C  500 mg Oral Daily  . dexamethasone (DECADRON) injection  6 mg Intravenous Q24H  . docusate sodium  100 mg Oral Daily  . insulin aspart  0-20 Units Subcutaneous TID WC  . insulin aspart  10 Units Subcutaneous TID WC  . insulin detemir  0.15 Units/kg Subcutaneous BID  . ipratropium  2 puff Inhalation Q6H  . zinc sulfate  220 mg Oral Daily   Continuous Infusions: . sodium chloride    . sodium chloride 50 mL/hr at 08/03/20 1014  . remdesivir 100 mg in NS 100 mL 100 mg (08/03/20 1019)     LOS: 1 day   Time spent: 75 mins   Delmus Warwick Wynetta Emery, MD How to contact the [facility] Attending or Consulting provider [place] or covering provider during after hours [place], for this patient?  1. Check the care team in [facility] and look for a) attending/consulting [facility] provider listed and b) the [facility] team listed 2. Log into [email or web address] and use [place]'s universal password to access. If you do not have the password, please contact the hospital operator. 3. Locate the [facility] provider you are looking for under [facility] and page to a number that you can be directly reached. 4. If you still have difficulty reaching the provider, please page the [facility] (Director on Call) for the Hospitalists listed on amion for assistance.  08/03/2020, 12:17 PM

## 2020-08-04 LAB — COMPREHENSIVE METABOLIC PANEL
ALT: 65 U/L — ABNORMAL HIGH (ref 0–44)
AST: 58 U/L — ABNORMAL HIGH (ref 15–41)
Albumin: 2.9 g/dL — ABNORMAL LOW (ref 3.5–5.0)
Alkaline Phosphatase: 38 U/L (ref 38–126)
Anion gap: 9 (ref 5–15)
BUN: 22 mg/dL (ref 8–23)
CO2: 23 mmol/L (ref 22–32)
Calcium: 7.9 mg/dL — ABNORMAL LOW (ref 8.9–10.3)
Chloride: 100 mmol/L (ref 98–111)
Creatinine, Ser: 0.8 mg/dL (ref 0.61–1.24)
GFR calc Af Amer: >60 mL/min (ref >60)
GFR calc non Af Amer: >60 mL/min (ref >60)
Glucose, Bld: 254 mg/dL — ABNORMAL HIGH (ref 70–99)
Potassium: 4.5 mmol/L (ref 3.5–5.1)
Sodium: 132 mmol/L — ABNORMAL LOW (ref 135–145)
Total Bilirubin: 0.9 mg/dL (ref 0.3–1.2)
Total Protein: 5.9 g/dL — ABNORMAL LOW (ref 6.5–8.1)

## 2020-08-04 LAB — CBC WITH DIFFERENTIAL/PLATELET
Abs Immature Granulocytes: 0.03 10*3/uL (ref 0.00–0.07)
Basophils Absolute: 0 10*3/uL (ref 0.0–0.1)
Basophils Relative: 0 %
Eosinophils Absolute: 0 10*3/uL (ref 0.0–0.5)
Eosinophils Relative: 0 %
HCT: 41.5 % (ref 39.0–52.0)
Hemoglobin: 14.1 g/dL (ref 13.0–17.0)
Immature Granulocytes: 1 %
Lymphocytes Relative: 16 %
Lymphs Abs: 0.5 10*3/uL — ABNORMAL LOW (ref 0.7–4.0)
MCH: 29.4 pg (ref 26.0–34.0)
MCHC: 34 g/dL (ref 30.0–36.0)
MCV: 86.6 fL (ref 80.0–100.0)
Monocytes Absolute: 0.4 10*3/uL (ref 0.1–1.0)
Monocytes Relative: 11 %
Neutro Abs: 2.4 10*3/uL (ref 1.7–7.7)
Neutrophils Relative %: 72 %
Platelets: 125 10*3/uL — ABNORMAL LOW (ref 150–400)
RBC: 4.79 MIL/uL (ref 4.22–5.81)
RDW: 12.7 % (ref 11.5–15.5)
WBC: 3.4 10*3/uL — ABNORMAL LOW (ref 4.0–10.5)
nRBC: 0 % (ref 0.0–0.2)

## 2020-08-04 LAB — CBG MONITORING, ED
Glucose-Capillary: 231 mg/dL — ABNORMAL HIGH (ref 70–99)
Glucose-Capillary: 168 mg/dL — ABNORMAL HIGH (ref 70–99)

## 2020-08-04 LAB — C-REACTIVE PROTEIN: CRP: 6 mg/dL — ABNORMAL HIGH (ref <1.0)

## 2020-08-04 LAB — MAGNESIUM: Magnesium: 2 mg/dL (ref 1.7–2.4)

## 2020-08-04 LAB — D-DIMER, QUANTITATIVE: D-Dimer, Quant: 0.67 ug/mL-FEU — ABNORMAL HIGH (ref 0.00–0.50)

## 2020-08-04 LAB — PHOSPHORUS: Phosphorus: 3.5 mg/dL (ref 2.5–4.6)

## 2020-08-04 LAB — FERRITIN: Ferritin: 1134 ng/mL — ABNORMAL HIGH (ref 24–336)

## 2020-08-04 MED ORDER — GUAIFENESIN-DM 100-10 MG/5ML PO SYRP: 10.0000 mL | ORAL_SOLUTION | ORAL | 0 refills | Status: DC | PRN

## 2020-08-04 MED ORDER — ZINC SULFATE 220 (50 ZN) MG PO CAPS: 220.0000 mg | ORAL_CAPSULE | Freq: Every day | ORAL | 0 refills | Status: DC

## 2020-08-04 MED ORDER — ASCORBIC ACID 500 MG PO TABS
500.0000 mg | ORAL_TABLET | Freq: Every day | ORAL | 0 refills | Status: DC
Start: 2020-08-04 — End: 2023-10-15

## 2020-08-04 MED ORDER — ACETAMINOPHEN 325 MG PO TABS: 650.0000 mg | ORAL_TABLET | Freq: Four times a day (QID) | ORAL | Status: DC | PRN

## 2020-08-04 MED ORDER — OMEPRAZOLE 20 MG PO CPDR: 20.0000 mg | DELAYED_RELEASE_CAPSULE | Freq: Every day | ORAL | 0 refills | Status: DC

## 2020-08-04 MED ORDER — DEXAMETHASONE 6 MG PO TABS: 6.0000 mg | ORAL_TABLET | Freq: Every day | ORAL | 0 refills | Status: DC

## 2020-08-04 NOTE — ED Notes (Signed)
Pt having a coughing spell. Cough syrup given.

## 2020-08-04 NOTE — Discharge Instructions (Signed)
Patient scheduled for outpatient Remdesivir infusions at 1pm on Saturday 9/4 and Sunday 9/5 at [facility]. Please inform the patient to park at [address], [place], as staff will be escorting the patient through the east entrance of the hospital. Appointments will take approximately 45 minutes.    There is a wave flag banner located near the entrance on [address] Turn into this entrance and immediately turn left and park in 1 of the 5 designated Covid Infusion Parking spots. There is a phone number on the sign, please call and let the staff know what spot you are in and we will come out and get you. For questions call [phone].  Thanks.     10  Things You Can Do to Manage Your COVID-19 Symptoms at Home If you have possible or confirmed COVID-19: 1. Stay home from work and school. And stay away from other public places. If you must go out, avoid using any kind of public transportation, ridesharing, or taxis. 2. Monitor your symptoms carefully. If your symptoms get worse, call your healthcare provider immediately. 3. Get rest and stay hydrated. 4. If you have a medical appointment, call the healthcare provider ahead of time and tell them that you have or may have COVID-19. 5. For medical emergencies, call 911 and notify the dispatch personnel that you have or may have COVID-19. 6. Cover your cough and sneezes with a tissue or use the inside of your elbow. 7. Wash your hands often with soap and water for at least 20 seconds or clean your hands with an alcohol-based hand sanitizer that contains at least 60% alcohol. 8. As much as possible, stay in a specific room and away from other people in your home. Also, you should use a separate bathroom, if available. If you need to be around other people in or outside of the home, wear a mask. 9. Avoid sharing personal items with other people in your household, like dishes, towels, and bedding. 10. Clean all surfaces that are touched  often, like counters, tabletops, and doorknobs. Use household cleaning sprays or wipes according to the label instructions. [email or web address] 06/02/2019 This information is not intended to replace advice given to you by your health care provider. Make sure you discuss any questions you have with your health care provider. Document Revised: 11/04/2019 Document Reviewed: 11/04/2019 Elsevier Patient Education  2020 [place].   COVID-19 COVID-19 is a respiratory infection that is caused by a virus called severe acute respiratory syndrome coronavirus 2 (SARS-CoV-2). The disease is also known as coronavirus disease or novel coronavirus. In some people, the virus may not cause any symptoms. In others, it may cause a serious infection. The infection can get worse quickly and can lead to complications, such as:  Pneumonia, or infection of the lungs.  Acute respiratory distress syndrome or ARDS. This is a condition in which fluid build-up in the lungs prevents the lungs from filling with air and passing oxygen into the blood.  Acute respiratory failure. This is a condition in which there is not enough oxygen passing from the lungs to the body or when carbon dioxide is not passing from the lungs out of the body.  Sepsis or septic shock. This is a serious bodily reaction to an infection.  Blood clotting problems.  Secondary infections due to bacteria or fungus.  Organ failure. This is when your body's organs stop working. The virus that causes COVID-19 is contagious. This means that it can spread from person  to person through droplets from coughs and sneezes (respiratory secretions). What are the causes? This illness is caused by a virus. You may catch the virus by:  Breathing in droplets from an infected person. Droplets can be spread by a person breathing, speaking, singing, coughing, or sneezing.  Touching something, like a table or a doorknob, that was exposed to the virus (contaminated)  and then touching your mouth, nose, or eyes. What increases the risk? Risk for infection You are more likely to be infected with this virus if you:  Are within 6 feet (2 meters) of a person with COVID-19.  Provide care for or live with a person who is infected with COVID-19.  Spend time in crowded indoor spaces or live in shared housing. Risk for serious illness You are more likely to become seriously ill from the virus if you:  Are 73 years of age or older. The higher your age, the more you are at risk for serious illness.  Live in a nursing home or long-term care facility.  Have cancer.  Have a long-term (chronic) disease such as: ? Chronic lung disease, including chronic obstructive pulmonary disease or asthma. ? A long-term disease that lowers your body's ability to fight infection (immunocompromised). ? Heart disease, including heart failure, a condition in which the arteries that lead to the heart become narrow or blocked (coronary artery disease), a disease which makes the heart muscle thick, weak, or stiff (cardiomyopathy). ? Diabetes. ? Chronic kidney disease. ? Sickle cell disease, a condition in which red blood cells have an abnormal "sickle" shape. ? Liver disease.  Are obese. What are the signs or symptoms? Symptoms of this condition can range from mild to severe. Symptoms may appear any time from 2 to 14 days after being exposed to the virus. They include:  A fever or chills.  A cough.  Difficulty breathing.  Headaches, body aches, or muscle aches.  Runny or stuffy (congested) nose.  A sore throat.  New loss of taste or smell. Some people may also have stomach problems, such as nausea, vomiting, or diarrhea. Other people may not have any symptoms of COVID-19. How is this diagnosed? This condition may be diagnosed based on:  Your signs and symptoms, especially if: ? You live in an area with a COVID-19 outbreak. ? You recently traveled to or from an  area where the virus is common. ? You provide care for or live with a person who was diagnosed with COVID-19. ? You were exposed to a person who was diagnosed with COVID-19.  A physical exam.  Lab tests, which may include: ? Taking a sample of fluid from the back of your nose and throat (nasopharyngeal fluid), your nose, or your throat using a swab. ? A sample of mucus from your lungs (sputum). ? Blood tests.  Imaging tests, which may include, X-rays, CT scan, or ultrasound. How is this treated? At present, there is no medicine to treat COVID-19. Medicines that treat other diseases are being used on a trial basis to see if they are effective against COVID-19. Your health care provider will talk with you about ways to treat your symptoms. For most people, the infection is mild and can be managed at home with rest, fluids, and over-the-counter medicines. Treatment for a serious infection usually takes places in a hospital intensive care unit (ICU). It may include one or more of the following treatments. These treatments are given until your symptoms improve.  Receiving fluids and medicines through  an IV.  Supplemental oxygen. Extra oxygen is given through a tube in the nose, a face mask, or a hood.  Positioning you to lie on your stomach (prone position). This makes it easier for oxygen to get into the lungs.  Continuous positive airway pressure (CPAP) or bi-level positive airway pressure (BPAP) machine. This treatment uses mild air pressure to keep the airways open. A tube that is connected to a motor delivers oxygen to the body.  Ventilator. This treatment moves air into and out of the lungs by using a tube that is placed in your windpipe.  Tracheostomy. This is a procedure to create a hole in the neck so that a breathing tube can be inserted.  Extracorporeal membrane oxygenation (ECMO). This procedure gives the lungs a chance to recover by taking over the functions of the heart and  lungs. It supplies oxygen to the body and removes carbon dioxide. Follow these instructions at home: Lifestyle  If you are sick, stay home except to get medical care. Your health care provider will tell you how long to stay home. Call your health care provider before you go for medical care.  Rest at home as told by your health care provider.  Do not use any products that contain nicotine or tobacco, such as cigarettes, e-cigarettes, and chewing tobacco. If you need help quitting, ask your health care provider.  Return to your normal activities as told by your health care provider. Ask your health care provider what activities are safe for you. General instructions  Take over-the-counter and prescription medicines only as told by your health care provider.  Drink enough fluid to keep your urine pale yellow.  Keep all follow-up visits as told by your health care provider. This is important. How is this prevented?  There is no vaccine to help prevent COVID-19 infection. However, there are steps you can take to protect yourself and others from this virus. To protect yourself:   Do not travel to areas where COVID-19 is a risk. The areas where COVID-19 is reported change often. To identify high-risk areas and travel restrictions, check the CDC travel website: FatFares.com.br  If you live in, or must travel to, an area where COVID-19 is a risk, take precautions to avoid infection. ? Stay away from people who are sick. ? Wash your hands often with soap and water for 20 seconds. If soap and water are not available, use an alcohol-based hand sanitizer. ? Avoid touching your mouth, face, eyes, or nose. ? Avoid going out in public, follow guidance from your state and local health authorities. ? If you must go out in public, wear a cloth face covering or face mask. Make sure your mask covers your nose and mouth. ? Avoid crowded indoor spaces. Stay at least 6 feet (2 meters) away from  others. ? Disinfect objects and surfaces that are frequently touched every day. This may include:  Counters and tables.  Doorknobs and light switches.  Sinks and faucets.  Electronics, such as phones, remote controls, keyboards, computers, and tablets. To protect others: If you have symptoms of COVID-19, take steps to prevent the virus from spreading to others.  If you think you have a COVID-19 infection, contact your health care provider right away. Tell your health care team that you think you may have a COVID-19 infection.  Stay home. Leave your house only to seek medical care. Do not use public transport.  Do not travel while you are sick.  Wash your hands  often with soap and water for 20 seconds. If soap and water are not available, use alcohol-based hand sanitizer.  Stay away from other members of your household. Let healthy household members care for children and pets, if possible. If you have to care for children or pets, wash your hands often and wear a mask. If possible, stay in your own room, separate from others. Use a different bathroom.  Make sure that all people in your household wash their hands well and often.  Cough or sneeze into a tissue or your sleeve or elbow. Do not cough or sneeze into your hand or into the air.  Wear a cloth face covering or face mask. Make sure your mask covers your nose and mouth. Where to find more information  Centers for Disease Control and Prevention: PurpleGadgets.be  World Health Organization: https://www.castaneda.info/ Contact a health care provider if:  You live in or have traveled to an area where COVID-19 is a risk and you have symptoms of the infection.  You have had contact with someone who has COVID-19 and you have symptoms of the infection. Get help right away if:  You have trouble breathing.  You have pain or pressure in your chest.  You have confusion.  You have bluish lips  and fingernails.  You have difficulty waking from sleep.  You have symptoms that get worse. These symptoms may represent a serious problem that is an emergency. Do not wait to see if the symptoms will go away. Get medical help right away. Call your local emergency services (911 in the [place] Do not drive yourself to the hospital. Let the emergency medical personnel know if you think you have COVID-19. Summary  COVID-19 is a respiratory infection that is caused by a virus. It is also known as coronavirus disease or novel coronavirus. It can cause serious infections, such as pneumonia, acute respiratory distress syndrome, acute respiratory failure, or sepsis.  The virus that causes COVID-19 is contagious. This means that it can spread from person to person through droplets from breathing, speaking, singing, coughing, or sneezing.  You are more likely to develop a serious illness if you are 76 years of age or older, have a weak immune system, live in a nursing home, or have chronic disease.  There is no medicine to treat COVID-19. Your health care provider will talk with you about ways to treat your symptoms.  Take steps to protect yourself and others from infection. Wash your hands often and disinfect objects and surfaces that are frequently touched every day. Stay away from people who are sick and wear a mask if you are sick. This information is not intended to replace advice given to you by your health care provider. Make sure you discuss any questions you have with your health care provider. Document Revised: 09/17/2019 Document Reviewed: 12/24/2018 Elsevier Patient Education  2020 [place].  COVID-19: How to Protect Yourself and Others Know how it spreads  There is currently no vaccine to prevent coronavirus disease 2019 (COVID-19).  The best way to prevent illness is to avoid being exposed to this virus.  The virus is thought to spread mainly from person-to-person. ? Between  people who are in close contact with one another (within about 6 feet). ? Through respiratory droplets produced when an infected person coughs, sneezes or talks. ? These droplets can land in the mouths or noses of people who are nearby or possibly be inhaled into the lungs. ? COVID-19 may be spread by  people who are not showing symptoms. Everyone should Clean your hands often  Wash your hands often with soap and water for at least 20 seconds especially after you have been in a public place, or after blowing your nose, coughing, or sneezing.  If soap and water are not readily available, use a hand sanitizer that contains at least 60% alcohol. Cover all surfaces of your hands and rub them together until they feel dry.  Avoid touching your eyes, nose, and mouth with unwashed hands. Avoid close contact  Limit contact with others as much as possible.  Avoid close contact with people who are sick.  Put distance between yourself and other people. ? Remember that some people without symptoms may be able to spread virus. ? This is especially important for people who are at higher risk of getting very GainPain.com.cy Cover your mouth and nose with a mask when around others  You could spread COVID-19 to others even if you do not feel sick.  Everyone should wear a mask in public settings and when around people not living in their household, especially when social distancing is difficult to maintain. ? Masks should not be placed on young children under age 61, anyone who has trouble breathing, or is unconscious, incapacitated or otherwise unable to remove the mask without assistance.  The mask is meant to protect other people in case you are infected.  Do NOT use a facemask meant for a Dietitian.  Continue to keep about 6 feet between yourself and others. The mask is not a substitute for social distancing. Cover  coughs and sneezes  Always cover your mouth and nose with a tissue when you cough or sneeze or use the inside of your elbow.  Throw used tissues in the trash.  Immediately wash your hands with soap and water for at least 20 seconds. If soap and water are not readily available, clean your hands with a hand sanitizer that contains at least 60% alcohol. Clean and disinfect  Clean AND disinfect frequently touched surfaces daily. This includes tables, doorknobs, light switches, countertops, handles, desks, phones, keyboards, toilets, faucets, and sinks. RackRewards.fr  If surfaces are dirty, clean them: Use detergent or soap and water prior to disinfection.  Then, use a household disinfectant. You can see a list of EPA-registered household disinfectants here. [email or web address] 08/04/2019 This information is not intended to replace advice given to you by your health care provider. Make sure you discuss any questions you have with your health care provider. Document Revised: 08/12/2019 Document Reviewed: 06/10/2019 Elsevier Patient Education  [place].   COVID-19 Frequently Asked Questions COVID-19 (coronavirus disease) is an infection that is caused by a large family of viruses. Some viruses cause illness in people and others cause illness in animals like camels, cats, and bats. In some cases, the viruses that cause illness in animals can spread to humans. Where did the coronavirus come from? In December 2019, [place] told the [facility]) of several cases of lung disease (human respiratory illness). These cases were linked to an open seafood and livestock market in the city of [place]. The link to the seafood and livestock market suggests that the virus may have spread from animals to humans. However, since that first outbreak in December, the virus has also been shown to spread from person to  person. What is the name of the disease and the virus? Disease name Early on, this disease was called novel coronavirus. This is because scientists determined  that the disease was caused by a new (novel) respiratory virus. The World Health Organization [facility]) has now named the disease COVID-19, or coronavirus disease. Virus name The virus that causes the disease is called severe acute respiratory syndrome coronavirus 2 (SARS-CoV-2). More information on disease and virus naming World Health Organization [facility]): www.who.int/emergencies/diseases/novel-coronavirus-2019/technical-guidance/naming-the-coronavirus-disease-(covid-2019)-and-the-virus-that-causes-it Who is at risk for complications from coronavirus disease? Some people may be at higher risk for complications from coronavirus disease. This includes older adults and people who have chronic diseases, such as heart disease, diabetes, and lung disease. If you are at higher risk for complications, take these extra precautions:  Stay home as much as possible.  Avoid social gatherings and travel.  Avoid close contact with others. Stay at least 6 ft (2 m) away from others, if possible.  Wash your hands often with soap and water for at least 20 seconds.  Avoid touching your face, mouth, nose, or eyes.  Keep supplies on hand at home, such as food, medicine, and cleaning supplies.  If you must go out in public, wear a cloth face covering or face mask. Make sure your mask covers your nose and mouth. How does coronavirus disease spread? The virus that causes coronavirus disease spreads easily from person to person (is contagious). You may catch the virus by:  Breathing in droplets from an infected person. Droplets can be spread by a person breathing, speaking, singing, coughing, or sneezing.  Touching something, like a table or a doorknob, that was exposed to the virus (contaminated) and then touching your mouth, nose, or eyes. Can I get the  virus from touching surfaces or objects? There is still a lot that we do not know about the virus that causes coronavirus disease. Scientists are basing a lot of information on what they know about similar viruses, such as:  Viruses cannot generally survive on surfaces for long. They need a human body (host) to survive.  It is more likely that the virus is spread by close contact with people who are sick (direct contact), such as through: ? Shaking hands or hugging. ? Breathing in respiratory droplets that travel through the air. Droplets can be spread by a person breathing, speaking, singing, coughing, or sneezing.  It is less likely that the virus is spread when a person touches a surface or object that has the virus on it (indirect contact). The virus may be able to enter the body if the person touches a surface or object and then touches his or her face, eyes, nose, or mouth. Can a person spread the virus without having symptoms of the disease? It may be possible for the virus to spread before a person has symptoms of the disease, but this is most likely not the main way the virus is spreading. It is more likely for the virus to spread by being in close contact with people who are sick and breathing in the respiratory droplets spread by a person breathing, speaking, singing, coughing, or sneezing. What are the symptoms of coronavirus disease? Symptoms vary from person to person and can range from mild to severe. Symptoms may include:  Fever or chills.  Cough.  Difficulty breathing or feeling short of breath.  Headaches, body aches, or muscle aches.  Runny or stuffy (congested) nose.  Sore throat.  New loss of taste or smell.  Nausea, vomiting, or diarrhea. These symptoms can appear anywhere from 2 to 14 days after you have been exposed to the virus. Some people may not have any  symptoms. If you develop symptoms, call your health care provider. People with severe symptoms may need  hospital care. Should I be tested for this virus? Your health care provider will decide whether to test you based on your symptoms, history of exposure, and your risk factors. How does a health care provider test for this virus? Health care providers will collect samples to send for testing. Samples may include:  Taking a swab of fluid from the back of your nose and throat, your nose, or your throat.  Taking fluid from the lungs by having you cough up mucus (sputum) into a sterile cup.  Taking a blood sample. Is there a treatment or vaccine for this virus? Currently, there is no vaccine to prevent coronavirus disease. Also, there are no medicines like antibiotics or antivirals to treat the virus. A person who becomes sick is given supportive care, which means rest and fluids. A person may also relieve his or her symptoms by using over-the-counter medicines that treat sneezing, coughing, and runny nose. These are the same medicines that a person takes for the common cold. If you develop symptoms, call your health care provider. People with severe symptoms may need hospital care. What can I do to protect myself and my family from this virus?     You can protect yourself and your family by taking the same actions that you would take to prevent the spread of other viruses. Take the following actions:  Wash your hands often with soap and water for at least 20 seconds. If soap and water are not available, use alcohol-based hand sanitizer.  Avoid touching your face, mouth, nose, or eyes.  Cough or sneeze into a tissue, sleeve, or elbow. Do not cough or sneeze into your hand or the air. ? If you cough or sneeze into a tissue, throw it away immediately and wash your hands.  Disinfect objects and surfaces that you frequently touch every day.  Stay away from people who are sick.  Avoid going out in public, follow guidance from your state and local health authorities.  Avoid crowded indoor  spaces. Stay at least 6 ft (2 m) away from others.  If you must go out in public, wear a cloth face covering or face mask. Make sure your mask covers your nose and mouth.  Stay home if you are sick, except to get medical care. Call your health care provider before you get medical care. Your health care provider will tell you how long to stay home.  Make sure your vaccines are up to date. Ask your health care provider what vaccines you need. What should I do if I need to travel? Follow travel recommendations from your local health authority, the CDC, and WHO. Travel information and advice  Centers for Disease Control and Prevention (CDC): BodyEditor.hu  World Health Organization [facility]): ThirdIncome.ca Know the risks and take action to protect your health  You are at higher risk of getting coronavirus disease if you are traveling to areas with an outbreak or if you are exposed to travelers from areas with an outbreak.  Wash your hands often and practice good hygiene to lower the risk of catching or spreading the virus. What should I do if I am sick? General instructions to stop the spread of infection  Wash your hands often with soap and water for at least 20 seconds. If soap and water are not available, use alcohol-based hand sanitizer.  Cough or sneeze into a tissue, sleeve, or elbow.  Do not cough or sneeze into your hand or the air.  If you cough or sneeze into a tissue, throw it away immediately and wash your hands.  Stay home unless you must get medical care. Call your health care provider or local health authority before you get medical care.  Avoid public areas. Do not take public transportation, if possible.  If you can, wear a mask if you must go out of the house or if you are in close contact with someone who is not sick. Make sure your mask covers your nose and mouth. Keep your  home clean  Disinfect objects and surfaces that are frequently touched every day. This may include: ? Counters and tables. ? Doorknobs and light switches. ? Sinks and faucets. ? Electronics such as phones, remote controls, keyboards, computers, and tablets.  Wash dishes in hot, soapy water or use a dishwasher. Air-dry your dishes.  Wash laundry in hot water. Prevent infecting other household members  Let healthy household members care for children and pets, if possible. If you have to care for children or pets, wash your hands often and wear a mask.  Sleep in a different bedroom or bed, if possible.  Do not share personal items, such as razors, toothbrushes, deodorant, combs, brushes, towels, and washcloths. Where to find more information Centers for Disease Control and Prevention (CDC)  Information and news updates: https://www.butler-gonzalez.com/ World Health Organization [facility])  Information and news updates: MissExecutive.com.ee  Coronavirus health topic: https://www.castaneda.info/  Questions and answers on COVID-19: OpportunityDebt.at  Global tracker: who.sprinklr.com American Academy of Pediatrics (AAP)  Information for families: www.healthychildren.org/English/health-issues/conditions/chest-lungs/Pages/2019-Novel-Coronavirus.aspx The coronavirus situation is changing rapidly. Check your local health authority website or the CDC and [facility] websites for updates and news. When should I contact a health care provider?  Contact your health care provider if you have symptoms of an infection, such as fever or cough, and you: ? Have been near anyone who is known to have coronavirus disease. ? Have come into contact with a person who is suspected to have coronavirus disease. ? Have traveled to an area where there is an outbreak of COVID-19. When should I get emergency medical care?  Get help right away  by calling your local emergency services (911 in the [place] if you have: ? Trouble breathing. ? Pain or pressure in your chest. ? Confusion. ? Blue-tinged lips and fingernails. ? Difficulty waking from sleep. ? Symptoms that get worse. Let the emergency medical personnel know if you think you have coronavirus disease. Summary  A new respiratory virus is spreading from person to person and causing COVID-19 (coronavirus disease).  The virus that causes COVID-19 appears to spread easily. It spreads from one person to another through droplets from breathing, speaking, singing, coughing, or sneezing.  Older adults and those with chronic diseases are at higher risk of disease. If you are at higher risk for complications, take extra precautions.  There is currently no vaccine to prevent coronavirus disease. There are no medicines, such as antibiotics or antivirals, to treat the virus.  You can protect yourself and your family by washing your hands often, avoiding touching your face, and covering your coughs and sneezes. This information is not intended to replace advice given to you by your health care provider. Make sure you discuss any questions you have with your health care provider. Document Revised: 09/17/2019 Document Reviewed: 03/16/2019 Elsevier Patient Education  [place]: Quarantine vs. Isolation QUARANTINE keeps someone who was in close  contact with someone who has COVID-19 away from others. If you had close contact with a person who has COVID-19  Stay home until 14 days after your last contact.  Check your temperature twice a day and watch for symptoms of COVID-19.  If possible, stay away from people who are at higher-risk for getting very sick from COVID-19. ISOLATION keeps someone who is sick or tested positive for COVID-19 without symptoms away from others, even in their own home. If you are sick and think or know you have COVID-19  Stay home until  after ? At least 10 days since symptoms first appeared and ? At least 24 hours with no fever without fever-reducing medication and ? Symptoms have improved If you tested positive for COVID-19 but do not have symptoms  Stay home until after ? 10 days have passed since your positive test If you live with others, stay in a specific "sick room" or area and away from other people or animals, including pets. Use a separate bathroom, if available. [email or web address] 06/21/2019 This information is not intended to replace advice given to you by your health care provider. Make sure you discuss any questions you have with your health care provider. Document Revised: 11/04/2019 Document Reviewed: 11/04/2019 Elsevier Patient Education  [place] if You Are Sick If you are sick with COVID-19 or think you might have COVID-19, follow the steps below to care for yourself and to help protect other people in your home and community. Stay home except to get medical care.  Stay home. Most people with COVID-19 have mild illness and are able to recover at home without medical care. Do not leave your home, except to get medical care. Do not visit public areas.  Take care of yourself. Get rest and stay hydrated. Take over-the-counter medicines, such as acetaminophen, to help you feel better.  Stay in touch with your doctor. Call before you get medical care. Be sure to get care if you have trouble breathing, or have any other emergency warning signs, or if you think it is an emergency.  Avoid public transportation, ride-sharing, or taxis. Separate yourself from other people and pets in your home.  As much as possible, stay in a specific room and away from other people and pets in your home. Also, you should use a separate bathroom, if available. If you need to be around other people or animals in or outside of the home, wear a mask. ? See COVID-19 and Animals if you have  questions about USFirm.ch. ? Additional guidance is available for those living in close quarters. (http://www.turner-rogers.com/.html) and shared housing (TVStereos.ch). Monitor your symptoms.  Symptoms of COVID-19 include fever, cough, and shortness of breath but other symptoms may be present as well.  Follow care instructions from your healthcare provider and local health department. Your local health authorities will give instructions on checking your symptoms and reporting information. When to Seek Emergency Medical Attention Look for emergency warning signs* for COVID-19. If someone is showing any of these signs, seek emergency medical care immediately:  Trouble breathing  Persistent pain or pressure in the chest  New confusion  Bluish lips or face  Inability to wake or stay awake *This list is not all possible symptoms. Please call your medical provider for any other symptoms that are severe or concerning to you. Call 911 or call ahead to your local emergency facility: Notify the operator that you are seeking care for someone who  has or may have COVID-19. Call ahead before visiting your doctor.  Call ahead. Many medical visits for routine care are being postponed or done by phone or telemedicine.  If you have a medical appointment that cannot be postponed, call your doctor's office, and tell them you have or may have COVID-19. If you are sick, wear a mask over your nose and mouth.  You should wear a mask over your nose and mouth if you must be around other people or animals, including pets (even at home).  You don't need to wear the mask if you are alone. If you can't put on a mask (because of trouble breathing for example), cover your coughs and sneezes in some other way. Try to stay at least 6 feet  away from other people. This will help protect the people around you.  Masks should not be placed on young children under age 26 years, anyone who has trouble breathing, or anyone who is not able to remove the mask without help. Note: During the COVID-19 pandemic, medical grade facemasks are reserved for healthcare workers and some first responders. You may need to make a mask using a scarf or bandana. Cover your coughs and sneezes.  Cover your mouth and nose with a tissue when you cough or sneeze.  Throw used tissues in a lined trash can.  Immediately wash your hands with soap and water for at least 20 seconds. If soap and water are not available, clean your hands with an alcohol-based hand sanitizer that contains at least 60% alcohol. Clean your hands often.  Wash your hands often with soap and water for at least 20 seconds. This is especially important after blowing your nose, coughing, or sneezing; going to the bathroom; and before eating or preparing food.  Use hand sanitizer if soap and water are not available. Use an alcohol-based hand sanitizer with at least 60% alcohol, covering all surfaces of your hands and rubbing them together until they feel dry.  Soap and water are the best option, especially if your hands are visibly dirty.  Avoid touching your eyes, nose, and mouth with unwashed hands. Avoid sharing personal household items.  Do not share dishes, drinking glasses, cups, eating utensils, towels, or bedding with other people in your home.  Wash these items thoroughly after using them with soap and water or put them in the dishwasher. Clean all "high-touch" surfaces everyday.  Clean and disinfect high-touch surfaces in your "sick room" and bathroom. Let someone else clean and disinfect surfaces in common areas, but not your bedroom and bathroom.  If a caregiver or other person needs to clean and disinfect a sick person's bedroom or bathroom, they should do so on an as-needed  basis. The caregiver/other person should wear a mask and wait as long as possible after the sick person has used the bathroom. High-touch surfaces include phones, remote controls, counters, tabletops, doorknobs, bathroom fixtures, toilets, keyboards, tablets, and bedside tables.  Clean and disinfect areas that may have blood, stool, or body fluids on them.  Use household cleaners and disinfectants. Clean the area or item with soap and water or another detergent if it is dirty. Then use a household disinfectant. ? Be sure to follow the instructions on the label to ensure safe and effective use of the product. Many products recommend keeping the surface wet for several minutes to ensure germs are killed. Many also recommend precautions such as wearing gloves and making sure you have good ventilation during use of  the product. ? Most EPA-registered household disinfectants should be effective. When you can be around others after you had or likely had COVID-19 When you can be around others (end home isolation) depends on different factors for different situations.  I think or know I had COVID-19, and I had symptoms ? You can be with others after  24 hours with no fever AND  Symptoms improved AND  10 days since symptoms first appeared ? Depending on your healthcare provider's advice and availability of testing, you might get tested to see if you still have COVID-19. If you will be tested, you can be around others when you have no fever, symptoms have improved, and you receive two negative test results in a row, at least 24 hours apart.  I tested positive for COVID-19 but had no symptoms ? If you continue to have no symptoms, you can be with others after:  10 days have passed since test ? Depending on your healthcare provider's advice and availability of testing, you might get tested to see if you still have COVID-19. If you will be tested, you can be around others after you receive two negative  test results in a row, at least 24 hours apart. ? If you develop symptoms after testing positive, follow the guidance above for "I think or know I had COVID, and I had symptoms." [email or web address] 07/13/2019 This information is not intended to replace advice given to you by your health care provider. Make sure you discuss any questions you have with your health care provider. Document Revised: 07/29/2019 Document Reviewed: 06/01/2019 Elsevier Patient Education  [place].   IMPORTANT INFORMATION: PAY CLOSE ATTENTION   PHYSICIAN DISCHARGE INSTRUCTIONS  Follow with Primary care provider  Celene Squibb, MD  and other consultants as instructed by your Hospitalist Physician  [place] IF SYMPTOMS COME BACK, WORSEN OR NEW PROBLEM DEVELOPS   Please note: You were cared for by a hospitalist during your hospital stay. Every effort will be made to forward records to your primary care provider.  You can request that your primary care provider send for your hospital records if they have not received them.  Once you are discharged, your primary care physician will handle any further medical issues. Please note that NO REFILLS for any discharge medications will be authorized once you are discharged, as it is imperative that you return to your primary care physician (or establish a relationship with a primary care physician if you do not have one) for your post hospital discharge needs so that they can reassess your need for medications and monitor your lab values.  Please get a complete blood count and chemistry panel checked by your Primary MD at your next visit, and again as instructed by your Primary MD.  Get Medicines reviewed and adjusted: Please take all your medications with you for your next visit with your Primary MD  Laboratory/radiological data: Please request your Primary MD to go over all hospital tests and procedure/radiological results at the  follow up, please ask your primary care provider to get all Hospital records sent to his/her office.  In some cases, they will be blood work, cultures and biopsy results pending at the time of your discharge. Please request that your primary care provider follow up on these results.  If you are diabetic, please bring your blood sugar readings with you to your follow up appointment with primary care.    Please call and make  your follow up appointments as soon as possible.    Also Note the following: If you experience worsening of your admission symptoms, develop shortness of breath, life threatening emergency, suicidal or homicidal thoughts you must seek medical attention immediately by calling 911 or calling your MD immediately  if symptoms less severe.  You must read complete instructions/literature along with all the possible adverse reactions/side effects for all the Medicines you take and that have been prescribed to you. Take any new Medicines after you have completely understood and accpet all the possible adverse reactions/side effects.   Do not drive when taking Pain medications or sleeping medications (Benzodiazepines)  Do not take more than prescribed Pain, Sleep and Anxiety Medications. It is not advisable to combine anxiety,sleep and pain medications without talking with your primary care practitioner  Special Instructions: If you have smoked or chewed Tobacco  in the last 2 yrs please stop smoking, stop any regular Alcohol  and or any Recreational drug use.  Wear Seat belts while driving.  Do not drive if taking any narcotic, mind altering or controlled substances or recreational drugs or alcohol.

## 2020-08-04 NOTE — ED Notes (Signed)
SATURATION QUALIFICATIONS: (This note is used to comply with regulatory documentation for home oxygen)  Patient Saturations on Room Air at Rest = 94%  Patient Saturations on Room Air while Ambulating = 88%  Patient Saturations on 4l liters of oxygen while Ambulating = 88%  Please briefly explain why patient needs home oxygen: pt has covid and desats below 90% on RA

## 2020-08-04 NOTE — TOC Initial Note (Signed)
Transition of Care [facility]) - Initial/Assessment Note    Patient Details  Name: Tim Day MRN: [id number] Date of Birth: [birth year]  Transition of [facility]) CM/SW Contact:    Ihor Gully, LCSW Phone Number: 08/04/2020, 2:47 PM  Clinical Narrative:                 Patient admitted for COVID symptom management. Needs home oxgen. Oxygen ordered.      Patient Goals and CMS Choice        Expected Discharge Plan and Services           Expected Discharge Date: 08/04/20               DME Arranged: Oxygen DME Agency: [place] Date DME Agency Contacted: 08/04/20   Representative spoke with at DME Agency: Jeneen Rinks            Prior Living Arrangements/Services                       Activities of Daily Living      Permission Sought/Granted                  Emotional Assessment              Admission diagnosis:  Pneumonia due to COVID-19 virus [U07.1, J12.82] Patient Active Problem List   Diagnosis Date Noted  . Hypocalcemia 08/03/2020  . Transaminitis 08/03/2020  . Pneumonia due to COVID-19 virus 08/02/2020  . Thrombocytopenia ([place]) 08/02/2020  . Leukopenia 08/02/2020  . Esophageal reflux   . Chest pain 01/22/2015  . Hypercholesteremia   . Hypertension   . Cardiogenic shock ([place]) 01/20/2015  . DM2 (diabetes mellitus, type 2) ([place]) 01/20/2015  . Benign essential HTN 01/20/2015  . Dyslipidemia 01/20/2015   PCP:  Celene Squibb, MD Pharmacy:   [facility], [place] [address] Phone: [phone] Fax: [phone]     Social Determinants of Health (SDOH) Interventions    Readmission Risk Interventions No flowsheet data found.

## 2020-08-04 NOTE — Discharge Summary (Addendum)
Physician Discharge Summary  Tim Day KZS:[id number] DOB: 01/26/1951 DOA: 08/02/2020  PCP: Celene Squibb, MD  Admit date: 08/02/2020 Discharge date: 08/04/2020  Admitted From:  Home  Disposition: Home   Recommendations for Outpatient Follow-up:  1. Arrangements made for outpatient remdesivir infusions to complete final 2 treatments 2. Please follow up in 2 weeks with PCP   Patient scheduled for outpatient Remdesivir infusions at 1pm on Saturday 9/4 and Sunday 9/5 at [facility]. Discharge instructions and progress note placed, thanks for placing orders Dr. Wynetta Emery  Home Health:  DME home oxygen   Discharge Condition: STABLE   CODE STATUS: FULL    Brief Hospitalization Summary: Please see all hospital notes, images, labs for full details of the hospitalization. ADMISSION HPI: Tim Day is a 69 y.o. 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  He denies fever, night sweats, but states he has been tired, fatigued, malaised, decreased appetite along with dry cough, body aches and several days of loose stools once or twice a day.  He denies chest pain, palpitations, dizziness, diaphoresis, PND, orthopnea or lower extremity edema.  He denies constipation, nausea, emesis, melena or hematochezia.  No dysuria, frequency or hematuria.  No polyuria, polydipsia, polyphagia or blurred vision.  ED Course: Initial vital signs were temperature 99 F,  pulse 81, respiration 18, blood pressure 130/67 mmHg O2 sat 86-93 %.  The patient received a 1000 mL LR bolus, 4 puffs of albuterol MDI and 10 mg of dexamethasone IVP.  CBC showed a white count of 2.6 with 62% neutrophils, 26% lymphocytes and 10% monocytes, hemoglobin 14.4 g/dL and platelets 86.  Sodium 131, potassium 4.1, chloride 97 CO2 21 mmol/L.  Renal function was normal.  Glucose 180 and calcium 8.2 mg/dL.  LFTs show mild elevation of transaminases with AST of 60 ALT of 64 units/L.  The rest of the hepatic functions were within expected range. LDH 324, D-dimer 0.91, fibrinogen 591, CRP 10.9 and ferritin 1.047.  Procalcitonin was less than 0.10 ng/L.  His chest radiograph shows multifocal pneumonia consistent with COVID-19 pneumonia.  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  Assessment & Plan:   Principal Problem:   Pneumonia due to COVID-19 virus Active Problems:   DM2 (diabetes mellitus, type 2)   Hypercholesteremia   Hypertension   Thrombocytopenia   Leukopenia   Esophageal reflux   Hypocalcemia   Transaminitis  1. Covid pneumonia - Pt continues to have supplemental oxygen requirements.  He will be maintained on remdesivir, dexamethasone, bronchodilators, supportive care. Inflammatory markers and trending down.  He is ambulating in the room with no  difficulty.  He is stable to discharge home today.  We have  made arrangements for him to complete remaining 2 remdesivir treatments outpatient at the infusion center.  If we can arrange for home oxygen, we will discharge home today.  He will need to complete remaining course of dexamethasone to complete full 10 day course.     2. Type 2 diabetes mellitus - In the hospital he was treated with SSI coverage and frequent CBG monitoring.  He was briefly on levemir BID.  As he discharges home he will resume his home treatment regimen and medications.  He was cautioned to monitor blood sugars closely and report to PCP and high readings.    3. Thrombocytopenia - likely reactive, improved to 125, no bleeding complications.   4. Leukopenia - consistent with viral infection, slightly improved to 3.4. 5. Hypocalcemia - corrected calcium 8.78 with current albumin level.  6. GERD - add protonix for GI protection.   7. Essential hypertension - resuming home ARB. Follow.  8. Transaminitis - LFTs have been stable as we followed daily.   DVT prophylaxis:  SCDs Code Status:  Full  Family Communication:  Wife updated telephone Disposition:  Home   Discharge Diagnoses:  Principal Problem:   Pneumonia due to COVID-19 virus Active Problems:   DM2 (diabetes mellitus, type 2) ([place])   Hypercholesteremia   Hypertension   Thrombocytopenia (HCC)   Leukopenia   Esophageal reflux   Hypocalcemia   Transaminitis  Discharge Instructions:  Allergies as of 08/04/2020      Reactions   Eggs Or Egg-derived Products Hives      Medication List    STOP taking these medications   diphenhydrAMINE 25 mg capsule Commonly known as: BENADRYL     TAKE these medications   acetaminophen 325 MG tablet Commonly known as: TYLENOL Take 2 tablets (650 mg total) by mouth every 6 (six) hours as needed for mild pain, fever or headache.   amLODipine 10 MG tablet Commonly known as: NORVASC Take 10 mg by mouth daily.   ascorbic acid 500 MG tablet Commonly known as: VITAMIN C Take 1  tablet (500 mg total) by mouth daily.   aspirin EC 81 MG tablet Take 81 mg by mouth daily.   co-enzyme Q-10 50 MG capsule Take 50 mg by mouth daily.   dexamethasone 6 MG tablet Commonly known as: DECADRON Take 1 tablet (6 mg total) by mouth daily for 8 days. Start taking on: August 05, 2020   glipiZIDE 5 MG tablet Commonly known as: GLUCOTROL Take 5 mg by mouth 2 (two) times daily before a meal.   guaiFENesin-dextromethorphan 100-10 MG/5ML syrup Commonly known as: ROBITUSSIN DM Take 10 mLs by mouth every 4 (four) hours as needed for cough.   metFORMIN 1000 MG tablet Commonly known as: GLUCOPHAGE Take 1 tablet (1,000 mg total) by mouth 2 (two) times daily with a meal.   olmesartan-hydrochlorothiazide 40-25 MG tablet Commonly known as: BENICAR HCT Take 1 tablet by mouth daily. Please schedule annual appt with Dr. Percival Spanish for refills. [phone]. 1st attempt   omeprazole 20 MG capsule Commonly known as: PriLOSEC Take 1 capsule (20 mg total) by mouth daily.   Trulicity 1.5 UJ/8.1XB Sopn Generic drug: Dulaglutide Inject 1.5 mg into the skin once a week.   zinc sulfate 220 (50 Zn) MG capsule Take 1 capsule (220 mg total) by mouth daily.            Durable Medical Equipment  (From admission, onward)  Start     Ordered   08/04/20 1007  For home use only DME oxygen  Once       Question Answer Comment  Length of Need 6 Months   Mode or (Route) Nasal cannula   Liters per Minute 4   Frequency Continuous (stationary and portable oxygen unit needed)   Oxygen conserving device Yes   Oxygen delivery system Gas      08/04/20 1007          Follow-up Information    Celene Squibb, MD. Schedule an appointment as soon as possible for a visit in 2 week(s).   Specialty: Internal Medicine Contact information: [address] [place] [facility] [address] [phone]        Minus Breeding, MD .   Specialty: Cardiology Contact information: [address] [place] [address] [phone]              Allergies  Allergen Reactions  . Eggs Or Egg-Derived Products Hives   Allergies as of 08/04/2020      Reactions   Eggs Or Egg-derived Products Hives      Medication List    STOP taking these medications   diphenhydrAMINE 25 mg capsule Commonly known as: BENADRYL     TAKE these medications   acetaminophen 325 MG tablet Commonly known as: TYLENOL Take 2 tablets (650 mg total) by mouth every 6 (six) hours as needed for mild pain, fever or headache.   amLODipine 10 MG tablet Commonly known as: NORVASC Take 10 mg by mouth daily.   ascorbic acid 500 MG tablet Commonly known as: VITAMIN C Take 1 tablet (500 mg total) by mouth daily.   aspirin EC 81 MG tablet Take 81 mg by mouth daily.   co-enzyme Q-10 50 MG capsule Take 50 mg by mouth daily.   dexamethasone 6 MG tablet Commonly known as: DECADRON Take 1 tablet (6 mg total) by mouth daily for 8 days. Start taking on: August 05, 2020   glipiZIDE 5 MG tablet Commonly known as: GLUCOTROL Take 5 mg by mouth 2 (two) times daily before a meal.   guaiFENesin-dextromethorphan 100-10 MG/5ML syrup Commonly known as: ROBITUSSIN DM Take 10 mLs by mouth every 4 (four) hours as needed for cough.   metFORMIN 1000 MG tablet Commonly known as: GLUCOPHAGE Take 1 tablet (1,000 mg total) by mouth 2 (two) times daily with a meal.   olmesartan-hydrochlorothiazide 40-25 MG tablet Commonly known as: BENICAR HCT Take 1 tablet by mouth daily. Please schedule annual appt with Dr. Percival Spanish for refills. [phone]. 1st attempt   omeprazole 20 MG capsule Commonly known as: PriLOSEC Take 1 capsule (20 mg total) by mouth daily.   Trulicity 1.5 VE/7.2CN Sopn Generic drug: Dulaglutide Inject 1.5 mg into the skin once a week.   zinc sulfate 220 (50 Zn) MG capsule Take 1 capsule (220 mg total) by mouth daily.            Durable Medical Equipment  (From admission,  onward)         Start     Ordered   08/04/20 1007  For home use only DME oxygen  Once       Question Answer Comment  Length of Need 6 Months   Mode or (Route) Nasal cannula   Liters per Minute 4   Frequency Continuous (stationary and portable oxygen unit needed)   Oxygen conserving device Yes   Oxygen delivery system Gas  08/04/20 1007          Procedures/Studies: DG Chest Portable 1 View  Result Date: 08/02/2020 CLINICAL DATA:  Dyspnea EXAM: PORTABLE CHEST 1 VIEW COMPARISON:  01/20/2015 FINDINGS: Lung volumes are small, but are symmetric. Pulmonary insufflation has improved since prior examination. Superimposed multifocal pulmonary infiltrates within the peripheral mid lung zones bilaterally are identified, most in keeping with atypical infection and compatible with the given history of COVID-19 pneumonia. There is no pneumothorax or pleural effusion. Cardiac size within normal limits. Probable acute left fourth rib fracture identified laterally. IMPRESSION: Pulmonary hypoinflation. Multifocal pulmonary infiltrates compatible with given history of COVID-19 pneumonia. Acute left fourth rib fracture.  No pneumothorax. Electronically Signed   By: Fidela Salisbury MD   On: 08/02/2020 17:14      Subjective: Pt ambulating in room, no chest pain and no SOB while wearing oxygen.    Discharge Exam: Vitals:   08/04/20 0823 08/04/20 0835  BP:  125/63  Pulse:  77  Resp:  20  Temp: 98.9 F (37.2 C) 98.9 F (37.2 C)  SpO2:  91%   Vitals:   08/04/20 0330 08/04/20 0338 08/04/20 0823 08/04/20 0835  BP:    125/63  Pulse: 81   77  Resp:    20  Temp:  99 F (37.2 C) 98.9 F (37.2 C) 98.9 F (37.2 C)  TempSrc:  Oral Oral Oral  SpO2: 94%   91%  Weight:      Height:       General: Pt is alert, awake, not in acute distress Cardiovascular:  normal S1/S2 +, no rubs, no gallops Respiratory: no wheezing, no rhonchi, no increased work of breathing.  Abdominal: Soft, NT, ND, bowel  sounds + Extremities: no edema, no cyanosis   The results of significant diagnostics from this hospitalization (including imaging, microbiology, ancillary and laboratory) are listed below for reference.     Microbiology: Recent Results (from the past 240 hour(s))  SARS Coronavirus 2 by RT PCR (hospital order, performed in [facility] hospital lab) Nasopharyngeal Nasopharyngeal Swab     Status: Abnormal   Collection Time: 08/02/20  8:48 PM   Specimen: Nasopharyngeal Swab  Result Value Ref Range Status   SARS Coronavirus 2 POSITIVE (A) NEGATIVE Final    Comment: CRITICAL RESULT CALLED TO, READ BACK BY AND VERIFIED WITH: GIBSON,RN AT 2210 ON 9.1.21 BY ISLEY,B (NOTE) SARS-CoV-2 target nucleic acids are DETECTED  SARS-CoV-2 RNA is generally detectable in upper respiratory specimens  during the acute phase of infection.  Positive results are indicative  of the presence of the identified virus, but do not rule out bacterial infection or co-infection with other pathogens not detected by the test.  Clinical correlation with patient history and  other diagnostic information is necessary to determine patient infection status.  The expected result is negative.  Fact Sheet for Patients:   StrictlyIdeas.no   Fact Sheet for Healthcare Providers:   BankingDealers.co.za    This test is not yet approved or cleared by the [place] FDA and  has been authorized for detection and/or diagnosis of SARS-CoV-2 by FDA under an Emergency Use Authorization (EUA).  This EUA will remain in effect (me aning this test can be used) for the duration of  the COVID-19 declaration under Section 564(b)(1) of the Act, 21 U.S.C. section 360-bbb-3(b)(1), unless the authorization is terminated or revoked sooner.  Performed at [facility], [address] [place], [place] [address]   Blood Culture (routine x 2)  Status: None (Preliminary result)   Collection Time:  08/02/20 10:06 PM   Specimen: BLOOD  Result Value Ref Range Status   Specimen Description BLOOD RIGHT ANTECUBITAL  Final   Special Requests   Final    BOTTLES DRAWN AEROBIC AND ANAEROBIC Blood Culture adequate volume   Culture   Final    NO GROWTH 2 DAYS Performed at [facility], [address] [place], [place] [address]    Report Status PENDING  Incomplete  Blood Culture (routine x 2)     Status: None (Preliminary result)   Collection Time: 08/02/20 10:24 PM   Specimen: BLOOD  Result Value Ref Range Status   Specimen Description BLOOD RIGHT ANTECUBITAL  Final   Special Requests   Final    BOTTLES DRAWN AEROBIC AND ANAEROBIC Blood Culture adequate volume   Culture   Final    NO GROWTH 1 DAY Performed at [facility], [address] [place],  [address]    Report Status PENDING  Incomplete     Labs: BNP (last 3 results) No results for input(s): BNP in the last 8760 hours. Basic Metabolic Panel: Recent Labs  Lab 08/02/20 2026 08/02/20 2207 08/03/20 0406 08/04/20 0526  NA 131*  --  130* 132*  K 4.1  --  4.2 4.5  CL 97*  --  99 100  CO2 21*  --  19* 23  GLUCOSE 180*  --  267* 254*  BUN 21  --  20 22  CREATININE 0.97  --  0.88 0.80  CALCIUM 8.2*  --  7.6* 7.9*  MG  --  1.9 1.8 2.0  PHOS  --  3.5 3.7 3.5   Liver Function Tests: Recent Labs  Lab 08/02/20 2026 08/03/20 0406 08/04/20 0526  AST 60* 54* 58*  ALT 64* 60* 65*  ALKPHOS 38 38 38  BILITOT 0.8 0.8 0.9  PROT 7.0 6.4* 5.9*  ALBUMIN 3.7 3.1* 2.9*   No results for input(s): LIPASE, AMYLASE in the last 168 hours. No results for input(s): AMMONIA in the last 168 hours. CBC: Recent Labs  Lab 08/02/20 2026 08/03/20 0406 08/04/20 0526  WBC 2.6* 2.0* 3.4*  NEUTROABS 1.6* 1.4* 2.4  HGB 14.4 13.7 14.1  HCT 42.7 41.3 41.5  MCV 88.4 87.7 86.6  PLT 86* 94* 125*   Cardiac Enzymes: No results for input(s): CKTOTAL, CKMB, CKMBINDEX, TROPONINI in the last 168 hours. BNP: Invalid input(s):  POCBNP CBG: Recent Labs  Lab 08/03/20 0441 08/03/20 0821 08/03/20 1320 08/03/20 1654 08/04/20 0819  GLUCAP 256* 213* 194* 194* 231*   D-Dimer Recent Labs    08/03/20 0406 08/04/20 0526  DDIMER 0.84* 0.67*   Hgb A1c No results for input(s): HGBA1C in the last 72 hours. Lipid Profile Recent Labs    08/02/20 2026  TRIG 129   Thyroid function studies No results for input(s): TSH, T4TOTAL, T3FREE, THYROIDAB in the last 72 hours.  Invalid input(s): FREET3 Anemia work up Recent Labs    08/03/20 0406 08/04/20 0526  FERRITIN 1,132* 1,134*   Urinalysis    Component Value Date/Time   COLORURINE YELLOW 01/21/2015 1822   APPEARANCEUR CLEAR 01/21/2015 1822   LABSPEC 1.021 01/21/2015 1822   PHURINE 5.5 01/21/2015 1822   GLUCOSEU NEGATIVE 01/21/2015 1822   HGBUR NEGATIVE 01/21/2015 1822   BILIRUBINUR NEGATIVE 01/21/2015 1822   KETONESUR NEGATIVE 01/21/2015 1822   PROTEINUR NEGATIVE 01/21/2015 1822   UROBILINOGEN 0.2 01/21/2015 1822   NITRITE NEGATIVE 01/21/2015 1822   LEUKOCYTESUR NEGATIVE 01/21/2015  [place] Invalid input(s): PROCALCITONIN,  WBC,  LACTICIDVEN Microbiology Recent Results (from the past 240 hour(s))  SARS Coronavirus 2 by RT PCR (hospital order, performed in [facility] hospital lab) Nasopharyngeal Nasopharyngeal Swab     Status: Abnormal   Collection Time: 08/02/20  8:48 PM   Specimen: Nasopharyngeal Swab  Result Value Ref Range Status   SARS Coronavirus 2 POSITIVE (A) NEGATIVE Final    Comment: CRITICAL RESULT CALLED TO, READ BACK BY AND VERIFIED WITH: GIBSON,RN AT 2210 ON 9.1.21 BY ISLEY,B (NOTE) SARS-CoV-2 target nucleic acids are DETECTED  SARS-CoV-2 RNA is generally detectable in upper respiratory specimens  during the acute phase of infection.  Positive results are indicative  of the presence of the identified virus, but do not rule out bacterial infection or co-infection with other pathogens not detected by the test.  Clinical  correlation with patient history and  other diagnostic information is necessary to determine patient infection status.  The expected result is negative.  Fact Sheet for Patients:   StrictlyIdeas.no   Fact Sheet for Healthcare Providers:   BankingDealers.co.za    This test is not yet approved or cleared by the [place] FDA and  has been authorized for detection and/or diagnosis of SARS-CoV-2 by FDA under an Emergency Use Authorization (EUA).  This EUA will remain in effect (me aning this test can be used) for the duration of  the COVID-19 declaration under Section 564(b)(1) of the Act, 21 U.S.C. section 360-bbb-3(b)(1), unless the authorization is terminated or revoked sooner.  Performed at [facility], [address] [place], [place] [address]   Blood Culture (routine x 2)     Status: None (Preliminary result)   Collection Time: 08/02/20 10:06 PM   Specimen: BLOOD  Result Value Ref Range Status   Specimen Description BLOOD RIGHT ANTECUBITAL  Final   Special Requests   Final    BOTTLES DRAWN AEROBIC AND ANAEROBIC Blood Culture adequate volume   Culture   Final    NO GROWTH 2 DAYS Performed at [facility], [address], [place], [place] [address]    Report Status PENDING  Incomplete  Blood Culture (routine x 2)     Status: None (Preliminary result)   Collection Time: 08/02/20 10:24 PM   Specimen: BLOOD  Result Value Ref Range Status   Specimen Description BLOOD RIGHT ANTECUBITAL  Final   Special Requests   Final    BOTTLES DRAWN AEROBIC AND ANAEROBIC Blood Culture adequate volume   Culture   Final    NO GROWTH 1 DAY Performed at [facility], [address], [place], [place] [address]    Report Status PENDING  Incomplete   Time coordinating discharge: 43 minutes   SIGNED:  Irwin Brakeman, MD  [facility] 08/04/2020, 10:34 AM How to contact the [facility] Attending or Consulting provider [place] or covering provider  during after hours [place], for this patient?  1. Check the care team in [facility] and look for a) attending/consulting [facility] provider listed and b) the [facility] team listed 2. Log into [email or web address] and use [place]'s universal password to access. If you do not have the password, please contact the hospital operator. 3. Locate the [facility] provider you are looking for under [facility] and page to a number that you can be directly reached. 4. If you still have difficulty reaching the provider, please page the [facility] (Director on Call) for the Hospitalists listed on amion for assistance.

## 2020-08-04 NOTE — Evaluation (Signed)
Physical Therapy Evaluation Patient Details Name: Tim Day MRN: [id number] DOB: 1951/01/25 Today's Date: 08/04/2020   History of Present Illness  Tim Day is a 69 y.o. 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  He denies fever, night sweats, but states he has been tired, fatigued, malaised, decreased appetite along with dry cough, body aches and several days of loose stools once or twice a day.  He denies chest pain, palpitations, dizziness, diaphoresis, PND, orthopnea or lower extremity edema.  He denies constipation, nausea, emesis, melena or hematochezia.  No dysuria, frequency or hematuria.  No polyuria, polydipsia, polyphagia or blurred vision.    Clinical Impression  Patient functioning at baseline for functional mobility and gait other than SpO2 drooping from 93% to 87% while on room air during ambulation.  Plan:  Patient discharged from physical therapy to care of nursing for ambulation daily as tolerated for length of stay.     Follow Up Recommendations No PT follow up    Equipment Recommendations  None recommended by PT    Recommendations for Other Services       Precautions / Restrictions Precautions Precautions: None Restrictions Weight Bearing Restrictions: No      Mobility  Bed Mobility Overal bed mobility: Independent                Transfers Overall transfer level: Modified independent Equipment used: None              General transfer comment: slightly labored  Ambulation/Gait Ambulation/Gait assistance: Modified independent (Device/Increase time) Gait Distance (Feet): 100 Feet Assistive device: None Gait Pattern/deviations: WFL(Within Functional Limits) Gait velocity: decreased   General Gait Details: grossly WNL, good return for ambulation in room and marching in place for up to 100 feet without loss of balance with SpO2 dropping from 93% to 87% while on room air  Stairs            Wheelchair Mobility    Modified Rankin (Stroke Patients Only)       Balance Overall balance assessment: No apparent balance deficits (not formally assessed)                                           Pertinent Vitals/Pain Pain Assessment: No/denies pain    Home Living Family/patient expects to be discharged to:: Private residence Living Arrangements: Spouse/significant other Available Help at Discharge: Family;Available 24 hours/day Type of Home: House Home Access: Stairs to enter Entrance Stairs-Rails: Right Entrance Stairs-Number of Steps: 6 Home Layout: One level Home Equipment: None      Prior Function Level of Independence: Independent         Comments: Hydrographic surveyor, drives, works using Secondary school teacher        Extremity/Trunk Assessment   Upper Extremity Assessment Upper Extremity Assessment: Overall WFL for tasks assessed    Lower Extremity Assessment Lower Extremity Assessment: Overall WFL for tasks assessed  Cervical / Trunk Assessment Cervical / Trunk Assessment: Normal  Communication   Communication: No difficulties  Cognition Arousal/Alertness: Awake/alert Behavior During Therapy: WFL for tasks assessed/performed Overall Cognitive Status: Within Functional Limits for tasks assessed                                        General Comments      Exercises      Assessment/Plan    PT Assessment Patent does not need any further PT services  PT Problem List         PT Treatment Interventions      PT Goals (Current goals can be found in the Care Plan section)  Acute Rehab PT Goals Patient Stated Goal: return home with family to assist PT Goal Formulation: With patient Time For Goal Achievement: 08/04/20 Potential to Achieve Goals: Good    Frequency     Barriers to discharge        Co-evaluation               AM-PAC PT "6 Clicks" Mobility  Outcome Measure Help needed turning from your back to your side while in a flat bed without using bedrails?: None Help needed moving from lying on your back to sitting on the side of a flat bed without using bedrails?: None Help needed moving to and from a bed to a chair (including a wheelchair)?: None Help needed standing up from a chair using your arms (e.g., wheelchair or bedside chair)?: None Help needed to walk in hospital room?: None Help needed climbing 3-5 steps with a railing? : None 6 Click Score: 24    End of Session Equipment Utilized During Treatment: Oxygen Activity Tolerance: Patient tolerated treatment well Patient left: in bed;with call bell/phone within reach Nurse Communication: Mobility status PT Visit Diagnosis: Unsteadiness on feet (R26.81);Other abnormalities of gait and mobility (R26.89);Muscle weakness (generalized) (M62.81)    Time: 6834-1962 PT Time Calculation (min) (ACUTE ONLY): 20 min   Charges:   PT Evaluation $PT Eval Moderate Complexity: 1 Mod PT Treatments $Therapeutic Activity: 8-22 mins        11:52 AM, 08/04/20 Tim Day Grandchild, MPT Physical Therapist with [facility] [phone] office [phone] mobile phone

## 2020-08-04 NOTE — Progress Notes (Signed)
Patient scheduled for outpatient Remdesivir infusions at 1pm on Saturday 9/4 and Sunday 9/5 at [facility]. Please inform the patient to park at [place], as staff will be escorting the patient through the [place] entrance of the hospital. Appointments will take approximately 45 minutes.    There is a wave flag banner located near the entrance on [address]. Turn into this entrance and immediately turn left and park in 1 of the 5 designated Covid Infusion Parking spots. There is a phone number on the sign, please call and let the staff know what spot you are in and we will come out and get you. For questions call [phone].  Thanks.

## 2020-08-05 ENCOUNTER — Ambulatory Visit (HOSPITAL_COMMUNITY)
Admit: 2020-08-05 | Discharge: 2020-08-05 | Disposition: A | Discharge status: Home / Self Care | Payer: 59 | Attending: Pulmonary Disease | Admitting: Pulmonary Disease

## 2020-08-05 DIAGNOSIS — A4189 Other specified sepsis: Secondary | ICD-10-CM | POA: Diagnosis not present

## 2020-08-05 DIAGNOSIS — R0602 Shortness of breath: Secondary | ICD-10-CM | POA: Diagnosis not present

## 2020-08-05 MED ORDER — DIPHENHYDRAMINE HCL 50 MG/ML IJ SOLN: 50.0000 mg | Freq: Once | INTRAMUSCULAR | Status: DC | PRN

## 2020-08-05 MED ORDER — FAMOTIDINE IN NACL 20-0.9 MG/50ML-% IV SOLN: 20.0000 mg | Freq: Once | INTRAVENOUS | Status: DC | PRN

## 2020-08-05 MED ORDER — ALBUTEROL SULFATE HFA 108 (90 BASE) MCG/ACT IN AERS: 2.0000 | INHALATION_SPRAY | Freq: Once | RESPIRATORY_TRACT | Status: DC | PRN

## 2020-08-05 MED ORDER — EPINEPHRINE 0.3 MG/0.3ML IJ SOAJ: 0.3000 mg | Freq: Once | INTRAMUSCULAR | Status: DC | PRN

## 2020-08-05 MED ORDER — SODIUM CHLORIDE 0.9 % IV SOLN
100.0000 mg | Freq: Once | INTRAVENOUS | Status: AC
  Administered 2020-08-05: 100 mg via INTRAVENOUS
  Filled 2020-08-05: qty 20

## 2020-08-05 MED ORDER — SODIUM CHLORIDE 0.9 % IV SOLN: INTRAVENOUS | Status: DC | PRN

## 2020-08-05 MED ORDER — METHYLPREDNISOLONE SODIUM SUCC 125 MG IJ SOLR: 125.0000 mg | Freq: Once | INTRAMUSCULAR | Status: DC | PRN

## 2020-08-05 NOTE — Discharge Instructions (Signed)

## 2020-08-05 NOTE — Progress Notes (Signed)
  Diagnosis: COVID-19  Physician: Vaughan Browner MD  Procedure: Covid Infusion Clinic Med: remdesivir infusion - Provided patient with remdesivir fact sheet for patients, parents and caregivers prior to infusion.  Complications: No immediate complications noted.  Discharge: Discharged home   Lucinda Dell 08/05/2020

## 2020-08-06 ENCOUNTER — Ambulatory Visit (HOSPITAL_COMMUNITY)
Admit: 2020-08-06 | Discharge: 2020-08-06 | Disposition: A | Discharge status: Home / Self Care | Payer: 59 | Attending: Pulmonary Disease | Admitting: Pulmonary Disease

## 2020-08-06 MED ORDER — METHYLPREDNISOLONE SODIUM SUCC 125 MG IJ SOLR: 125.0000 mg | Freq: Once | INTRAMUSCULAR | Status: DC | PRN

## 2020-08-06 MED ORDER — EPINEPHRINE 0.3 MG/0.3ML IJ SOAJ: 0.3000 mg | Freq: Once | INTRAMUSCULAR | Status: DC | PRN

## 2020-08-06 MED ORDER — FAMOTIDINE IN NACL 20-0.9 MG/50ML-% IV SOLN: 20.0000 mg | Freq: Once | INTRAVENOUS | Status: DC | PRN

## 2020-08-06 MED ORDER — SODIUM CHLORIDE 0.9 % IV SOLN
100.0000 mg | Freq: Once | INTRAVENOUS | Status: AC
  Administered 2020-08-06: 100 mg via INTRAVENOUS
  Filled 2020-08-06: qty 20

## 2020-08-06 MED ORDER — SODIUM CHLORIDE 0.9 % IV SOLN: INTRAVENOUS | Status: DC | PRN

## 2020-08-06 MED ORDER — DIPHENHYDRAMINE HCL 50 MG/ML IJ SOLN: 50.0000 mg | Freq: Once | INTRAMUSCULAR | Status: DC | PRN

## 2020-08-06 MED ORDER — ALBUTEROL SULFATE HFA 108 (90 BASE) MCG/ACT IN AERS: 2.0000 | INHALATION_SPRAY | Freq: Once | RESPIRATORY_TRACT | Status: DC | PRN

## 2020-08-06 NOTE — Discharge Instructions (Signed)
10 Things You Can Do to Manage Your COVID-19 Symptoms at Home If you have possible or confirmed COVID-19: 1. Stay home from work and school. And stay away from other public places. If you must go out, avoid using any kind of public transportation, ridesharing, or taxis. 2. Monitor your symptoms carefully. If your symptoms get worse, call your healthcare provider immediately. 3. Get rest and stay hydrated. 4. If you have a medical appointment, call the healthcare provider ahead of time and tell them that you have or may have COVID-19. 5. For medical emergencies, call 911 and notify the dispatch personnel that you have or may have COVID-19. 6. Cover your cough and sneezes with a tissue or use the inside of your elbow. 7. Wash your hands often with soap and water for at least 20 seconds or clean your hands with an alcohol-based hand sanitizer that contains at least 60% alcohol. 8. As much as possible, stay in a specific room and away from other people in your home. Also, you should use a separate bathroom, if available. If you need to be around other people in or outside of the home, wear a mask. 9. Avoid sharing personal items with other people in your household, like dishes, towels, and bedding. 10. Clean all surfaces that are touched often, like counters, tabletops, and doorknobs. Use household cleaning sprays or wipes according to the label instructions. cdc.gov/coronavirus 06/02/2019 This information is not intended to replace advice given to you by your health care provider. Make sure you discuss any questions you have with your health care provider. Document Revised: 11/04/2019 Document Reviewed: 11/04/2019 Elsevier Patient Education  2020 Elsevier Inc.  

## 2020-08-06 NOTE — Progress Notes (Signed)
  Diagnosis: COVID-19  Physician: Dr. Joya Gaskins Procedure: Covid Infusion Clinic Med: remdesivir infusion - Provided patient with remdesivir fact sheet for patients, parents and caregivers prior to infusion.  Complications: No immediate complications noted.  Discharge: Discharged home   Tim Day  [place] 08/06/2020

## 2020-08-07 LAB — CULTURE, BLOOD (ROUTINE X 2)
Culture: NO GROWTH
Special Requests: ADEQUATE

## 2020-08-08 ENCOUNTER — Other Ambulatory Visit: Payer: Self-pay

## 2020-08-08 ENCOUNTER — Emergency Department (HOSPITAL_COMMUNITY): Payer: 59

## 2020-08-08 ENCOUNTER — Encounter (HOSPITAL_COMMUNITY): Payer: Self-pay | Admitting: Emergency Medicine

## 2020-08-08 ENCOUNTER — Inpatient Hospital Stay (HOSPITAL_COMMUNITY)
Admission: EM | Admit: 2020-08-08 | Discharge: 2020-08-14 | DRG: 871 | Disposition: A | Discharge status: Home / Self Care | Payer: 59 | Attending: Internal Medicine | Admitting: Internal Medicine

## 2020-08-08 DIAGNOSIS — E871 Hypo-osmolality and hyponatremia: Secondary | ICD-10-CM | POA: Diagnosis present

## 2020-08-08 DIAGNOSIS — Z85820 Personal history of malignant melanoma of skin: Secondary | ICD-10-CM

## 2020-08-08 DIAGNOSIS — R7982 Elevated C-reactive protein (CRP): Secondary | ICD-10-CM | POA: Diagnosis present

## 2020-08-08 DIAGNOSIS — J9601 Acute respiratory failure with hypoxia: Secondary | ICD-10-CM | POA: Diagnosis present

## 2020-08-08 DIAGNOSIS — E785 Hyperlipidemia, unspecified: Secondary | ICD-10-CM | POA: Diagnosis not present

## 2020-08-08 DIAGNOSIS — R7989 Other specified abnormal findings of blood chemistry: Secondary | ICD-10-CM | POA: Diagnosis not present

## 2020-08-08 DIAGNOSIS — I82462 Acute embolism and thrombosis of left calf muscular vein: Secondary | ICD-10-CM | POA: Diagnosis present

## 2020-08-08 DIAGNOSIS — Z91012 Allergy to eggs: Secondary | ICD-10-CM

## 2020-08-08 DIAGNOSIS — I82451 Acute embolism and thrombosis of right peroneal vein: Secondary | ICD-10-CM | POA: Diagnosis present

## 2020-08-08 DIAGNOSIS — I10 Essential (primary) hypertension: Secondary | ICD-10-CM | POA: Diagnosis present

## 2020-08-08 DIAGNOSIS — A4189 Other specified sepsis: Secondary | ICD-10-CM | POA: Diagnosis present

## 2020-08-08 DIAGNOSIS — J1282 Pneumonia due to coronavirus disease 2019: Secondary | ICD-10-CM | POA: Diagnosis present

## 2020-08-08 DIAGNOSIS — K219 Gastro-esophageal reflux disease without esophagitis: Secondary | ICD-10-CM | POA: Diagnosis present

## 2020-08-08 DIAGNOSIS — Z833 Family history of diabetes mellitus: Secondary | ICD-10-CM | POA: Diagnosis not present

## 2020-08-08 DIAGNOSIS — U071 COVID-19: Secondary | ICD-10-CM | POA: Diagnosis present

## 2020-08-08 DIAGNOSIS — E119 Type 2 diabetes mellitus without complications: Secondary | ICD-10-CM | POA: Diagnosis not present

## 2020-08-08 DIAGNOSIS — R6520 Severe sepsis without septic shock: Secondary | ICD-10-CM | POA: Diagnosis present

## 2020-08-08 DIAGNOSIS — A419 Sepsis, unspecified organism: Secondary | ICD-10-CM | POA: Diagnosis present

## 2020-08-08 DIAGNOSIS — Z7982 Long term (current) use of aspirin: Secondary | ICD-10-CM | POA: Diagnosis not present

## 2020-08-08 DIAGNOSIS — Z7984 Long term (current) use of oral hypoglycemic drugs: Secondary | ICD-10-CM | POA: Diagnosis not present

## 2020-08-08 DIAGNOSIS — R040 Epistaxis: Secondary | ICD-10-CM | POA: Diagnosis not present

## 2020-08-08 DIAGNOSIS — R0602 Shortness of breath: Secondary | ICD-10-CM | POA: Diagnosis present

## 2020-08-08 DIAGNOSIS — E782 Mixed hyperlipidemia: Secondary | ICD-10-CM | POA: Diagnosis present

## 2020-08-08 DIAGNOSIS — T380X5A Adverse effect of glucocorticoids and synthetic analogues, initial encounter: Secondary | ICD-10-CM | POA: Diagnosis present

## 2020-08-08 DIAGNOSIS — Z8249 Family history of ischemic heart disease and other diseases of the circulatory system: Secondary | ICD-10-CM | POA: Diagnosis not present

## 2020-08-08 DIAGNOSIS — E1165 Type 2 diabetes mellitus with hyperglycemia: Secondary | ICD-10-CM | POA: Diagnosis present

## 2020-08-08 DIAGNOSIS — Z79899 Other long term (current) drug therapy: Secondary | ICD-10-CM | POA: Diagnosis not present

## 2020-08-08 DIAGNOSIS — E1169 Type 2 diabetes mellitus with other specified complication: Secondary | ICD-10-CM

## 2020-08-08 DIAGNOSIS — J982 Interstitial emphysema: Secondary | ICD-10-CM | POA: Diagnosis present

## 2020-08-08 LAB — BASIC METABOLIC PANEL
Anion gap: 13 (ref 5–15)
BUN: 36 mg/dL — ABNORMAL HIGH (ref 8–23)
CO2: 17 mmol/L — ABNORMAL LOW (ref 22–32)
Calcium: 8.9 mg/dL (ref 8.9–10.3)
Chloride: 102 mmol/L (ref 98–111)
Creatinine, Ser: 1 mg/dL (ref 0.61–1.24)
GFR calc Af Amer: >60 mL/min (ref >60)
GFR calc non Af Amer: >60 mL/min (ref >60)
Glucose, Bld: 358 mg/dL — ABNORMAL HIGH (ref 70–99)
Potassium: 4.4 mmol/L (ref 3.5–5.1)
Sodium: 132 mmol/L — ABNORMAL LOW (ref 135–145)

## 2020-08-08 LAB — FERRITIN: Ferritin: 944 ng/mL — ABNORMAL HIGH (ref 24–336)

## 2020-08-08 LAB — CBC
HCT: 44.9 % (ref 39.0–52.0)
HCT: 47 % (ref 39.0–52.0)
Hemoglobin: 15.8 g/dL (ref 13.0–17.0)
Hemoglobin: 16 g/dL (ref 13.0–17.0)
MCH: 29.5 pg (ref 26.0–34.0)
MCH: 28.8 pg (ref 26.0–34.0)
MCHC: 35.2 g/dL (ref 30.0–36.0)
MCHC: 34 g/dL (ref 30.0–36.0)
MCV: 83.8 fL (ref 80.0–100.0)
MCV: 84.7 fL (ref 80.0–100.0)
Platelets: 257 10*3/uL (ref 150–400)
Platelets: 229 10*3/uL (ref 150–400)
RBC: 5.36 MIL/uL (ref 4.22–5.81)
RBC: 5.55 MIL/uL (ref 4.22–5.81)
RDW: 12.8 % (ref 11.5–15.5)
RDW: 12.8 % (ref 11.5–15.5)
WBC: 12.9 10*3/uL — ABNORMAL HIGH (ref 4.0–10.5)
WBC: 12 10*3/uL — ABNORMAL HIGH (ref 4.0–10.5)
nRBC: 0 % (ref 0.0–0.2)
nRBC: 0 % (ref 0.0–0.2)

## 2020-08-08 LAB — CULTURE, BLOOD (ROUTINE X 2)
Culture: NO GROWTH
Special Requests: ADEQUATE

## 2020-08-08 LAB — PROCALCITONIN: Procalcitonin: <0.1 ng/mL

## 2020-08-08 LAB — TROPONIN I (HIGH SENSITIVITY)
Troponin I (High Sensitivity): 7 ng/L (ref <18)
Troponin I (High Sensitivity): 9 ng/L (ref <18)

## 2020-08-08 LAB — BRAIN NATRIURETIC PEPTIDE: B Natriuretic Peptide: 68.3 pg/mL (ref 0.0–100.0)

## 2020-08-08 LAB — C-REACTIVE PROTEIN: CRP: 2.2 mg/dL — ABNORMAL HIGH (ref <1.0)

## 2020-08-08 LAB — LACTIC ACID, PLASMA: Lactic Acid, Venous: 2.8 mmol/L (ref 0.5–1.9)

## 2020-08-08 LAB — D-DIMER, QUANTITATIVE: D-Dimer, Quant: >20 ug/mL-FEU — ABNORMAL HIGH (ref 0.00–0.50)

## 2020-08-08 LAB — CBG MONITORING, ED
Glucose-Capillary: 282 mg/dL — ABNORMAL HIGH (ref 70–99)
Glucose-Capillary: 387 mg/dL — ABNORMAL HIGH (ref 70–99)

## 2020-08-08 LAB — CREATININE, SERUM
Creatinine, Ser: 0.96 mg/dL (ref 0.61–1.24)
GFR calc Af Amer: >60 mL/min (ref >60)
GFR calc non Af Amer: >60 mL/min (ref >60)

## 2020-08-08 LAB — LACTATE DEHYDROGENASE: LDH: 506 U/L — ABNORMAL HIGH (ref 98–192)

## 2020-08-08 LAB — FIBRINOGEN: Fibrinogen: 462 mg/dL (ref 210–475)

## 2020-08-08 LAB — HEMOGLOBIN A1C
Hgb A1c MFr Bld: 7.2 % — ABNORMAL HIGH (ref 4.8–5.6)
Mean Plasma Glucose: 159.94 mg/dL

## 2020-08-08 LAB — TRIGLYCERIDES: Triglycerides: 403 mg/dL — ABNORMAL HIGH (ref <150)

## 2020-08-08 MED ORDER — AMLODIPINE BESYLATE 10 MG PO TABS
10.0000 mg | ORAL_TABLET | Freq: Every day | ORAL | Status: DC
  Administered 2020-08-08 – 2020-08-14 (×7): 10 mg via ORAL
  Filled 2020-08-08: qty 2
  Filled 2020-08-08 (×3): qty 1
  Filled 2020-08-08: qty 2
  Filled 2020-08-08 (×2): qty 1

## 2020-08-08 MED ORDER — ASCORBIC ACID 500 MG PO TABS
500.0000 mg | ORAL_TABLET | Freq: Every day | ORAL | Status: DC
  Administered 2020-08-08 – 2020-08-14 (×7): 500 mg via ORAL
  Filled 2020-08-08 (×7): qty 1

## 2020-08-08 MED ORDER — HYDROCHLOROTHIAZIDE 25 MG PO TABS
25.0000 mg | ORAL_TABLET | Freq: Every day | ORAL | Status: DC
  Administered 2020-08-08 – 2020-08-14 (×7): 25 mg via ORAL
  Filled 2020-08-08 (×7): qty 1

## 2020-08-08 MED ORDER — HYDROCOD POLST-CPM POLST ER 10-8 MG/5ML PO SUER
5.0000 mL | Freq: Two times a day (BID) | ORAL | Status: DC | PRN
  Administered 2020-08-08 – 2020-08-12 (×4): 5 mL via ORAL
  Filled 2020-08-08 (×4): qty 5

## 2020-08-08 MED ORDER — PREDNISONE 20 MG PO TABS: 50.0000 mg | ORAL_TABLET | Freq: Every day | ORAL | Status: DC

## 2020-08-08 MED ORDER — TOCILIZUMAB 400 MG/20ML IV SOLN: 8.0000 mg/kg | Freq: Once | INTRAVENOUS | Status: DC

## 2020-08-08 MED ORDER — SODIUM CHLORIDE 0.9 % IV SOLN: 100.0000 mg | Freq: Every day | INTRAVENOUS | Status: DC

## 2020-08-08 MED ORDER — ZINC SULFATE 220 (50 ZN) MG PO CAPS
220.0000 mg | ORAL_CAPSULE | Freq: Every day | ORAL | Status: DC
  Administered 2020-08-08 – 2020-08-14 (×7): 220 mg via ORAL
  Filled 2020-08-08 (×7): qty 1

## 2020-08-08 MED ORDER — IRBESARTAN 300 MG PO TABS
300.0000 mg | ORAL_TABLET | Freq: Every day | ORAL | Status: DC
  Administered 2020-08-09 – 2020-08-14 (×6): 300 mg via ORAL
  Filled 2020-08-08 (×7): qty 1

## 2020-08-08 MED ORDER — IOHEXOL 350 MG/ML SOLN
75.0000 mL | Freq: Once | INTRAVENOUS | Status: AC | PRN
  Administered 2020-08-08: 75 mL via INTRAVENOUS

## 2020-08-08 MED ORDER — INSULIN ASPART 100 UNIT/ML ~~LOC~~ SOLN
0.0000 [IU] | Freq: Three times a day (TID) | SUBCUTANEOUS | Status: DC
  Administered 2020-08-08: 8 [IU] via SUBCUTANEOUS

## 2020-08-08 MED ORDER — ALBUTEROL SULFATE (2.5 MG/3ML) 0.083% IN NEBU: 2.5000 mg | INHALATION_SOLUTION | RESPIRATORY_TRACT | Status: DC | PRN

## 2020-08-08 MED ORDER — SODIUM CHLORIDE 0.9 % IV SOLN
1000.0000 mL | INTRAVENOUS | Status: DC
  Administered 2020-08-08 – 2020-08-09 (×2): 1000 mL via INTRAVENOUS

## 2020-08-08 MED ORDER — ACETAMINOPHEN 325 MG PO TABS
650.0000 mg | ORAL_TABLET | Freq: Four times a day (QID) | ORAL | Status: DC | PRN
  Administered 2020-08-09: 650 mg via ORAL
  Filled 2020-08-08: qty 2

## 2020-08-08 MED ORDER — ENOXAPARIN SODIUM 40 MG/0.4ML ~~LOC~~ SOLN
40.0000 mg | SUBCUTANEOUS | Status: DC
  Administered 2020-08-08: 40 mg via SUBCUTANEOUS
  Filled 2020-08-08: qty 0.4

## 2020-08-08 MED ORDER — METHYLPREDNISOLONE SODIUM SUCC 125 MG IJ SOLR
0.5000 mg/kg | Freq: Two times a day (BID) | INTRAMUSCULAR | Status: DC
  Administered 2020-08-08: 45.625 mg via INTRAVENOUS
  Filled 2020-08-08: qty 2

## 2020-08-08 MED ORDER — INSULIN ASPART 100 UNIT/ML ~~LOC~~ SOLN
0.0000 [IU] | Freq: Every day | SUBCUTANEOUS | Status: DC
  Administered 2020-08-08: 5 [IU] via SUBCUTANEOUS

## 2020-08-08 MED ORDER — ONDANSETRON HCL 4 MG/2ML IJ SOLN: 4.0000 mg | Freq: Four times a day (QID) | INTRAMUSCULAR | Status: DC | PRN

## 2020-08-08 MED ORDER — GUAIFENESIN-DM 100-10 MG/5ML PO SYRP
10.0000 mL | ORAL_SOLUTION | ORAL | Status: DC | PRN
  Administered 2020-08-14: 10 mL via ORAL
  Filled 2020-08-08: qty 10

## 2020-08-08 MED ORDER — ONDANSETRON HCL 4 MG PO TABS: 4.0000 mg | ORAL_TABLET | Freq: Four times a day (QID) | ORAL | Status: DC | PRN

## 2020-08-08 MED ORDER — PANTOPRAZOLE SODIUM 40 MG PO TBEC
40.0000 mg | DELAYED_RELEASE_TABLET | Freq: Every day | ORAL | Status: DC
  Administered 2020-08-08 – 2020-08-14 (×7): 40 mg via ORAL
  Filled 2020-08-08 (×7): qty 1

## 2020-08-08 MED ORDER — SODIUM CHLORIDE 0.9 % IV SOLN
200.0000 mg | Freq: Once | INTRAVENOUS | Status: DC
  Filled 2020-08-08: qty 40

## 2020-08-08 MED ORDER — BARICITINIB 2 MG PO TABS
4.0000 mg | ORAL_TABLET | Freq: Every day | ORAL | Status: DC
  Administered 2020-08-08 – 2020-08-14 (×7): 4 mg via ORAL
  Filled 2020-08-08 (×7): qty 2

## 2020-08-08 NOTE — ED Triage Notes (Signed)
Pt reports covid positive on Wednesday and on 5-6L of O2. Pts O2 sats on O2 85%. Endorses some mild back pain.
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
## 2020-08-08 NOTE — ED Notes (Signed)
Pt provided meal tray at this time.

## 2020-08-08 NOTE — ED Provider Notes (Signed)
3:08 PM PT waiting admission for covid with hypoxia despite home 6L. Waiting for PE study results before admission.   3:34 PM   PE study returned showing no evidence of on embolism but did show evidence of the Covid pneumonia as well as several small bubbles of pneumomediastinum which the radiology felt was likely from barotrauma from Covid.  They do not see abscess or other mediastinal abnormality by report.  We will call for admission for further management of COVID-19 with worsened hypoxia.    Quillan Whitter, Gwenyth Allegra, MD 08/08/20 [phone]
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
## 2020-08-09 ENCOUNTER — Inpatient Hospital Stay (HOSPITAL_BASED_OUTPATIENT_CLINIC_OR_DEPARTMENT_OTHER): Payer: 59

## 2020-08-09 DIAGNOSIS — R7989 Other specified abnormal findings of blood chemistry: Secondary | ICD-10-CM

## 2020-08-09 DIAGNOSIS — K219 Gastro-esophageal reflux disease without esophagitis: Secondary | ICD-10-CM

## 2020-08-09 DIAGNOSIS — E119 Type 2 diabetes mellitus without complications: Secondary | ICD-10-CM

## 2020-08-09 DIAGNOSIS — I10 Essential (primary) hypertension: Secondary | ICD-10-CM

## 2020-08-09 DIAGNOSIS — E871 Hypo-osmolality and hyponatremia: Secondary | ICD-10-CM

## 2020-08-09 DIAGNOSIS — E785 Hyperlipidemia, unspecified: Secondary | ICD-10-CM

## 2020-08-09 LAB — CBC WITH DIFFERENTIAL/PLATELET
Abs Immature Granulocytes: 0.14 10*3/uL — ABNORMAL HIGH (ref 0.00–0.07)
Basophils Absolute: 0 10*3/uL (ref 0.0–0.1)
Basophils Relative: 0 %
Eosinophils Absolute: 0 10*3/uL (ref 0.0–0.5)
Eosinophils Relative: 0 %
HCT: 41.2 % (ref 39.0–52.0)
Hemoglobin: 14.3 g/dL (ref 13.0–17.0)
Immature Granulocytes: 2 %
Lymphocytes Relative: 10 %
Lymphs Abs: 0.6 10*3/uL — ABNORMAL LOW (ref 0.7–4.0)
MCH: 29.2 pg (ref 26.0–34.0)
MCHC: 34.7 g/dL (ref 30.0–36.0)
MCV: 84.1 fL (ref 80.0–100.0)
Monocytes Absolute: 0.1 10*3/uL (ref 0.1–1.0)
Monocytes Relative: 2 %
Neutro Abs: 5.4 10*3/uL (ref 1.7–7.7)
Neutrophils Relative %: 86 %
Platelets: 211 10*3/uL (ref 150–400)
RBC: 4.9 MIL/uL (ref 4.22–5.81)
RDW: 13 % (ref 11.5–15.5)
WBC: 6.3 10*3/uL (ref 4.0–10.5)
nRBC: 0 % (ref 0.0–0.2)

## 2020-08-09 LAB — CBG MONITORING, ED
Glucose-Capillary: 284 mg/dL — ABNORMAL HIGH (ref 70–99)
Glucose-Capillary: 322 mg/dL — ABNORMAL HIGH (ref 70–99)
Glucose-Capillary: 304 mg/dL — ABNORMAL HIGH (ref 70–99)
Glucose-Capillary: 341 mg/dL — ABNORMAL HIGH (ref 70–99)

## 2020-08-09 LAB — COMPREHENSIVE METABOLIC PANEL
ALT: 36 U/L (ref 0–44)
AST: 23 U/L (ref 15–41)
Albumin: 2.7 g/dL — ABNORMAL LOW (ref 3.5–5.0)
Alkaline Phosphatase: 55 U/L (ref 38–126)
Anion gap: 11 (ref 5–15)
BUN: 30 mg/dL — ABNORMAL HIGH (ref 8–23)
CO2: 19 mmol/L — ABNORMAL LOW (ref 22–32)
Calcium: 8.3 mg/dL — ABNORMAL LOW (ref 8.9–10.3)
Chloride: 102 mmol/L (ref 98–111)
Creatinine, Ser: 0.86 mg/dL (ref 0.61–1.24)
GFR calc Af Amer: >60 mL/min (ref >60)
GFR calc non Af Amer: >60 mL/min (ref >60)
Glucose, Bld: 305 mg/dL — ABNORMAL HIGH (ref 70–99)
Potassium: 4.6 mmol/L (ref 3.5–5.1)
Sodium: 132 mmol/L — ABNORMAL LOW (ref 135–145)
Total Bilirubin: 0.9 mg/dL (ref 0.3–1.2)
Total Protein: 6.1 g/dL — ABNORMAL LOW (ref 6.5–8.1)

## 2020-08-09 LAB — GLUCOSE, CAPILLARY: Glucose-Capillary: 315 mg/dL — ABNORMAL HIGH (ref 70–99)

## 2020-08-09 LAB — FERRITIN: Ferritin: 714 ng/mL — ABNORMAL HIGH (ref 24–336)

## 2020-08-09 LAB — PHOSPHORUS: Phosphorus: 3.9 mg/dL (ref 2.5–4.6)

## 2020-08-09 LAB — LACTIC ACID, PLASMA: Lactic Acid, Venous: 3.2 mmol/L (ref 0.5–1.9)

## 2020-08-09 LAB — MAGNESIUM: Magnesium: 2.2 mg/dL (ref 1.7–2.4)

## 2020-08-09 LAB — C-REACTIVE PROTEIN: CRP: 8.4 mg/dL — ABNORMAL HIGH (ref <1.0)

## 2020-08-09 LAB — D-DIMER, QUANTITATIVE: D-Dimer, Quant: >20 ug/mL-FEU — ABNORMAL HIGH (ref 0.00–0.50)

## 2020-08-09 MED ORDER — METHYLPREDNISOLONE SODIUM SUCC 125 MG IJ SOLR
80.0000 mg | Freq: Two times a day (BID) | INTRAMUSCULAR | Status: DC
  Administered 2020-08-09 – 2020-08-11 (×5): 80 mg via INTRAVENOUS
  Filled 2020-08-09 (×5): qty 2

## 2020-08-09 MED ORDER — INSULIN ASPART 100 UNIT/ML ~~LOC~~ SOLN
3.0000 [IU] | Freq: Three times a day (TID) | SUBCUTANEOUS | Status: DC
  Administered 2020-08-09 (×3): 3 [IU] via SUBCUTANEOUS

## 2020-08-09 MED ORDER — ENOXAPARIN SODIUM 100 MG/ML ~~LOC~~ SOLN
1.0000 mg/kg | Freq: Two times a day (BID) | SUBCUTANEOUS | Status: DC
  Administered 2020-08-09 – 2020-08-10 (×3): 90 mg via SUBCUTANEOUS
  Filled 2020-08-09 (×3): qty 0.9

## 2020-08-09 MED ORDER — INSULIN DETEMIR 100 UNIT/ML ~~LOC~~ SOLN
10.0000 [IU] | Freq: Every day | SUBCUTANEOUS | Status: DC
  Administered 2020-08-09: 10 [IU] via SUBCUTANEOUS
  Filled 2020-08-09 (×2): qty 0.1

## 2020-08-09 MED ORDER — INSULIN ASPART 100 UNIT/ML ~~LOC~~ SOLN
0.0000 [IU] | Freq: Three times a day (TID) | SUBCUTANEOUS | Status: DC
  Administered 2020-08-09: 15 [IU] via SUBCUTANEOUS
  Administered 2020-08-09: 11 [IU] via SUBCUTANEOUS
  Administered 2020-08-09: 15 [IU] via SUBCUTANEOUS
  Administered 2020-08-10 (×2): 11 [IU] via SUBCUTANEOUS
  Administered 2020-08-10 – 2020-08-11 (×2): 15 [IU] via SUBCUTANEOUS
  Administered 2020-08-11: 11 [IU] via SUBCUTANEOUS
  Administered 2020-08-11 – 2020-08-12 (×2): 15 [IU] via SUBCUTANEOUS
  Administered 2020-08-12: 11 [IU] via SUBCUTANEOUS
  Administered 2020-08-12: 7 [IU] via SUBCUTANEOUS
  Administered 2020-08-13: 11 [IU] via SUBCUTANEOUS
  Administered 2020-08-13: 15 [IU] via SUBCUTANEOUS
  Administered 2020-08-14: 11 [IU] via SUBCUTANEOUS
  Administered 2020-08-14 (×2): 7 [IU] via SUBCUTANEOUS

## 2020-08-09 MED ORDER — LINAGLIPTIN 5 MG PO TABS
5.0000 mg | ORAL_TABLET | Freq: Every day | ORAL | Status: DC
  Administered 2020-08-09 – 2020-08-14 (×6): 5 mg via ORAL
  Filled 2020-08-09 (×6): qty 1

## 2020-08-09 MED ORDER — INSULIN ASPART 100 UNIT/ML ~~LOC~~ SOLN
0.0000 [IU] | Freq: Every day | SUBCUTANEOUS | Status: DC
  Administered 2020-08-09: 4 [IU] via SUBCUTANEOUS
  Administered 2020-08-10: 5 [IU] via SUBCUTANEOUS
  Administered 2020-08-11: 4 [IU] via SUBCUTANEOUS
  Administered 2020-08-12: 5 [IU] via SUBCUTANEOUS
  Administered 2020-08-13: 2 [IU] via SUBCUTANEOUS

## 2020-08-09 NOTE — ED Notes (Signed)
Pt O2 increased from 2L to 6 due to O2 sat 89% with movement.

## 2020-08-09 NOTE — ED Notes (Signed)
HFNC decreased to 10lpm.

## 2020-08-09 NOTE — Progress Notes (Signed)
Bilateral Lower Ext. study completed.   See CVProc for preliminary results.   Artavis Cowie, RDMS, RVT 

## 2020-08-09 NOTE — Progress Notes (Signed)
PROGRESS NOTE  Tim Day  ZOX:[id number] DOB: [birth year] DOA: 08/08/2020 PCP: Celene Squibb, MD   Brief Narrative: Tim Day is a 69 y.o. male with a history of T2DM, HTN, HLD, GERD, and recent hospitalization for covid-19 pneumonia (discharged 9/3 on 6LPM O2; completed remdesivir infusions as outpatient 9/5) who reported worsening shortness of breath despite taking decadron as directed. In the ED 9/7, he was hypoxemic requiring 15L NRB with tachypnea, lactic acid elevation, CRP 2.2, and d-dimer >20. CTA chest showed no evidence of PE but demonstrated widespread airspace opacities and mild pneumomediastinum. He was readmitted for acute hypoxemic respiratory failure due to covid-19 multifocal pneumonia, given IV steroids, baricitinib, and empiric full dose lovenox. Subsequent LE venous U/S confirmed presence of acute bilateral DVTs.   Assessment & Plan: Principal Problem:   Acute hypoxemic respiratory failure due to COVID-19 [facility]) Active Problems:   DM2 (diabetes mellitus, type 2) (HCC)   Benign essential HTN   Dyslipidemia   Esophageal reflux   Sepsis ([place])   Hyponatremia  Acute hypoxemic respiratory failure due to covid-19 pneumonia: SARS-CoV-2 PCR positive on 9/1 s/p remdesivir 9/1 - 9/5, not vaccinated - Restart steroids, augment solumedrol due to elevated CRP - Continue baricitinib, started 9/7, for 14 days or until hospital discharge. Monitor Cr, LFTs, differential. Will keep on full dose anticoagulation fur the duration of therapy.  - Encourage OOB, IS, FV, and awake proning if able - Monitor CMP and inflammatory markers - Encouraged to get vaccine after isolation period ends (9/22)  Acute bilateral DVTs in the right peroneal veins and left gastrocnemius veins: Provoked by covid infection.  - Therapeutic lovenox. CTA chest without PE, though index of suspicion remains high.   Pneumomediastinum:  - Supportive care, not severe, though certainly a marker of  severity of pulmonary injury. In personal experience, this suggests a protracted recovery period.  T2DM with steroid-induced hyperglycemia:  - Start levemir 10u, 3u TIDWC, increase to resistant SSI and add linagliptin. Titrate as needed.  GERD:  - Continue PPI  HTN:  - Continue home medications  DVT prophylaxis: Lovenox 1mg /kg q12h Code Status: Full, confirmed Family Communication: Wife by speakerphone at bedside Disposition Plan:  Status is: Inpatient  Remains inpatient appropriate because:Inpatient level of care appropriate due to severity of illness   Dispo: The patient is from: Home              Anticipated d/c is to: Home              Anticipated d/c date is: > 3 days              Patient currently is not medically stable to d/c.  Consultants:   None  Procedures:   None  Antimicrobials:  None   Subjective: No chest pain, though shortness of breath is stable, severe, worse with exertion. Eating ok. No leg swelling or pain.   Objective: Vitals:   08/09/20 0530 08/09/20 0730 08/09/20 0830 08/09/20 1330  BP: 114/72 124/72 115/67 125/70  Pulse: 80 80 91 77  Resp: (!) 22 (!) 21 (!) 30 (!) 25  Temp:   97.6 F (36.4 C)   TempSrc:   Oral   SpO2: 93% 90% 98% 97%  Weight:      Height:        Intake/Output Summary (Last 24 hours) at 08/09/2020 1756 Last data filed at 08/09/2020 1401 Gross per 24 hour  Intake --  Output 2350 ml  Net -2350 ml  Filed Weights   08/08/20 1139  Weight: 90.7 kg    Gen: 69 y.o. male in no distress  Pulm: Non-labored breathing HFNC, crackles bilaterally.   CV: Regular rate and rhythm. No murmur, rub, or gallop. No JVD, no pedal edema. GI: Abdomen soft, non-tender, non-distended, with normoactive bowel sounds. No organomegaly or masses felt. Ext: Warm, no deformities Skin: No rashes, lesions or ulcers Neuro: Alert and oriented. No focal neurological deficits. Psych: Judgement and insight appear normal. Mood & affect appropriate.    Data Reviewed: I have personally reviewed following labs and the CTA of the chest.  CBC: Recent Labs  Lab 08/02/20 2026 08/02/20 2026 08/03/20 0406 08/04/20 0526 08/08/20 1144 08/08/20 1616 08/09/20 0351  WBC 2.6*   < > 2.0* 3.4* 12.9* 12.0* 6.3  NEUTROABS 1.6*  --  1.4* 2.4  --   --  5.4  HGB 14.4   < > 13.7 14.1 15.8 16.0 14.3  HCT 42.7   < > 41.3 41.5 44.9 47.0 41.2  MCV 88.4   < > 87.7 86.6 83.8 84.7 84.1  PLT 86*   < > 94* 125* 257 229 211   < > = values in this interval not displayed.   Basic Metabolic Panel: Recent Labs  Lab 08/02/20 2026 08/02/20 2026 08/02/20 2207 08/03/20 0406 08/04/20 0526 08/08/20 1144 08/08/20 1616 08/09/20 0351  NA 131*  --   --  130* 132* 132*  --  132*  K 4.1  --   --  4.2 4.5 4.4  --  4.6  CL 97*  --   --  99 100 102  --  102  CO2 21*  --   --  19* 23 17*  --  19*  GLUCOSE 180*  --   --  267* 254* 358*  --  305*  BUN 21  --   --  20 22 36*  --  30*  CREATININE 0.97   < >  --  0.88 0.80 1.00 0.96 0.86  CALCIUM 8.2*  --   --  7.6* 7.9* 8.9  --  8.3*  MG  --   --  1.9 1.8 2.0  --   --  2.2  PHOS  --   --  3.5 3.7 3.5  --   --  3.9   < > = values in this interval not displayed.   GFR: Estimated Creatinine Clearance: 94.4 mL/min (by C-G formula based on SCr of 0.86 mg/dL). Liver Function Tests: Recent Labs  Lab 08/02/20 2026 08/03/20 0406 08/04/20 0526 08/09/20 0351  AST 60* 54* 58* 23  ALT 64* 60* 65* 36  ALKPHOS 38 38 38 55  BILITOT 0.8 0.8 0.9 0.9  PROT 7.0 6.4* 5.9* 6.1*  ALBUMIN 3.7 3.1* 2.9* 2.7*   No results for input(s): LIPASE, AMYLASE in the last 168 hours. No results for input(s): AMMONIA in the last 168 hours. Coagulation Profile: No results for input(s): INR, PROTIME in the last 168 hours. Cardiac Enzymes: No results for input(s): CKTOTAL, CKMB, CKMBINDEX, TROPONINI in the last 168 hours. BNP (last 3 results) No results for input(s): PROBNP in the last 8760 hours. HbA1C: Recent Labs    08/08/20 1616   HGBA1C 7.2*   CBG: Recent Labs  Lab 08/08/20 1836 08/08/20 2232 08/09/20 0806 08/09/20 1024 08/09/20 1354  GLUCAP 282* 387* 284* 322* 304*   Lipid Profile: Recent Labs    08/08/20 1356  TRIG 403*   Thyroid Function Tests: No results for input(s):  TSH, T4TOTAL, FREET4, T3FREE, THYROIDAB in the last 72 hours. Anemia Panel: Recent Labs    08/08/20 1356 08/09/20 0351  FERRITIN 944* 714*   Urine analysis:    Component Value Date/Time   COLORURINE YELLOW 01/21/2015 1822   APPEARANCEUR CLEAR 01/21/2015 1822   LABSPEC 1.021 01/21/2015 1822   PHURINE 5.5 01/21/2015 1822   GLUCOSEU NEGATIVE 01/21/2015 1822   HGBUR NEGATIVE 01/21/2015 1822   BILIRUBINUR NEGATIVE 01/21/2015 1822   KETONESUR NEGATIVE 01/21/2015 1822   PROTEINUR NEGATIVE 01/21/2015 1822   UROBILINOGEN 0.2 01/21/2015 1822   NITRITE NEGATIVE 01/21/2015 1822   LEUKOCYTESUR NEGATIVE 01/21/2015 1822   Recent Results (from the past 240 hour(s))  SARS Coronavirus 2 by RT PCR (hospital order, performed in [facility] hospital lab) Nasopharyngeal Nasopharyngeal Swab     Status: Abnormal   Collection Time: 08/02/20  8:48 PM   Specimen: Nasopharyngeal Swab  Result Value Ref Range Status   SARS Coronavirus 2 POSITIVE (A) NEGATIVE Final    Comment: CRITICAL RESULT CALLED TO, READ BACK BY AND VERIFIED WITH: GIBSON,RN AT 2210 ON 9.1.21 BY ISLEY,B (NOTE) SARS-CoV-2 target nucleic acids are DETECTED  SARS-CoV-2 RNA is generally detectable in upper respiratory specimens  during the acute phase of infection.  Positive results are indicative  of the presence of the identified virus, but do not rule out bacterial infection or co-infection with other pathogens not detected by the test.  Clinical correlation with patient history and  other diagnostic information is necessary to determine patient infection status.  The expected result is negative.  Fact Sheet for Patients:    StrictlyIdeas.no   Fact Sheet for Healthcare Providers:   BankingDealers.co.za    This test is not yet approved or cleared by the [place] FDA and  has been authorized for detection and/or diagnosis of SARS-CoV-2 by FDA under an Emergency Use Authorization (EUA).  This EUA will remain in effect (me aning this test can be used) for the duration of  the COVID-19 declaration under Section 564(b)(1) of the Act, 21 U.S.C. section 360-bbb-3(b)(1), unless the authorization is terminated or revoked sooner.  Performed at [facility], [address], [place], [place] [address]   Blood Culture (routine x 2)     Status: None   Collection Time: 08/02/20 10:06 PM   Specimen: BLOOD  Result Value Ref Range Status   Specimen Description BLOOD RIGHT ANTECUBITAL  Final   Special Requests   Final    BOTTLES DRAWN AEROBIC AND ANAEROBIC Blood Culture adequate volume   Culture   Final    NO GROWTH 5 DAYS Performed at [facility], [address], [place], [place] [address]    Report Status 08/07/2020 FINAL  Final  Blood Culture (routine x 2)     Status: None   Collection Time: 08/02/20 10:24 PM   Specimen: BLOOD  Result Value Ref Range Status   Specimen Description BLOOD RIGHT ANTECUBITAL  Final   Special Requests   Final    BOTTLES DRAWN AEROBIC AND ANAEROBIC Blood Culture adequate volume   Culture   Final    NO GROWTH 5 DAYS Performed at [facility], [address], [place], [place] [address]    Report Status 08/08/2020 FINAL  Final  Blood Culture (routine x 2)     Status: None (Preliminary result)   Collection Time: 08/08/20  1:56 PM   Specimen: BLOOD  Result Value Ref Range Status   Specimen Description BLOOD RIGHT ANTECUBITAL  Final   Special Requests  Final    BOTTLES DRAWN AEROBIC AND ANAEROBIC Blood Culture adequate volume   Culture   Final    NO GROWTH < 24 HOURS Performed at [facility], [place] [address], [place],  [place] [address]    Report Status PENDING  Incomplete  Blood Culture (routine x 2)     Status: None (Preliminary result)   Collection Time: 08/08/20  1:56 PM   Specimen: BLOOD  Result Value Ref Range Status   Specimen Description BLOOD LEFT ANTECUBITAL  Final   Special Requests   Final    BOTTLES DRAWN AEROBIC ONLY Blood Culture results may not be optimal due to an inadequate volume of blood received in culture bottles   Culture   Final    NO GROWTH < 24 HOURS Performed at [facility], [place] [address] [place], [place] [address]    Report Status PENDING  Incomplete      Radiology Studies: CT Angio Chest PE W and/or Wo Contrast  Result Date: 08/08/2020 CLINICAL DATA:  COVID-19 positive, shortness of breath EXAM: CT ANGIOGRAPHY CHEST WITH CONTRAST TECHNIQUE: Multidetector CT imaging of the chest was performed using the standard protocol during bolus administration of intravenous contrast. Multiplanar CT image reconstructions and MIPs were obtained to evaluate the vascular anatomy. CONTRAST:  6mL OMNIPAQUE IOHEXOL 350 MG/ML SOLN COMPARISON:  08/08/2020, 08/02/2020 FINDINGS: Cardiovascular: This is a technically adequate evaluation of the pulmonary vasculature. No filling defects or pulmonary emboli. The heart is unremarkable without pericardial effusion. Mild atherosclerosis of the aorta and LAD distribution of the coronary vasculature. Mediastinum/Nodes: There is a small amount of pneumomediastinum anterior to the right hilum and left mainstem bronchus, likely result of barotrauma. No pathologic adenopathy. Thyroid, trachea, and esophagus are unremarkable. Lungs/Pleura: Multifocal bilateral areas of ground-glass airspace disease consistent with COVID 19 pneumonia. No effusion or pneumothorax. The central airways are patent. Upper Abdomen: No acute abnormality. Musculoskeletal: No acute or destructive bony lesions. Reconstructed images demonstrate no additional findings. Review of the MIP images  confirms the above findings. IMPRESSION: 1. Widespread bilateral ground-glass airspace disease consistent with multifocal COVID-19 pneumonia. 2. Minimal pneumomediastinum consistent with barotrauma. 3. No evidence of pulmonary embolus. 4.  Aortic Atherosclerosis (ICD10-I70.0). These results were called by telephone at the time of interpretation on 08/08/2020 at 3:32 pm to provider Antony Blackbird, who verbally acknowledged these results. Electronically Signed   By: Randa Ngo M.D.   On: 08/08/2020 15:33   DG Chest Port 1 View  Result Date: 08/08/2020 CLINICAL DATA:  Shortness of breath with reported COVID-19 positive EXAM: PORTABLE CHEST 1 VIEW COMPARISON:  August 02, 2020 FINDINGS: There is overall less opacity compared to recent prior study. Subtle ill-defined opacity remains in each mid lung. There is slight atelectasis in each lung base. No new opacity evident. Heart size and pulmonary vascularity are normal. No adenopathy. A lead overlies apparent left fourth rib fracture. IMPRESSION: Overall less opacity in the lungs compared to recent study. Subtle ill-defined opacity remains in each mid lung as well as mild left and right base atelectasis. Stable cardiac silhouette. No adenopathy. Electronically Signed   By: Lowella Grip III M.D.   On: 08/08/2020 13:19   VAS [place] LOWER EXTREMITY VENOUS (DVT)  Result Date: 08/09/2020  Lower Venous DVTStudy Indications: Elevated D dimer. Other Indications: + Covid. Performing Technologist: Griffin Basil RCT RDMS  Examination Guidelines: A complete evaluation includes B-mode imaging, spectral Doppler, color Doppler, and power Doppler as needed of all accessible portions of each  vessel. Bilateral testing is considered an integral part of a complete examination. Limited examinations for reoccurring indications may be performed as noted. The reflux portion of the exam is performed with the patient in reverse Trendelenburg.   +---------+---------------+---------+-----------+---------------+--------------+  RIGHT     Compressibility Phasicity Spontaneity Properties      Thrombus Aging  +---------+---------------+---------+-----------+---------------+--------------+  CFV       Full            Yes       Yes                                         +---------+---------------+---------+-----------+---------------+--------------+  SFJ       Full                                                                  +---------+---------------+---------+-----------+---------------+--------------+  FV Prox   Full                                                                  +---------+---------------+---------+-----------+---------------+--------------+  FV Mid    Full                                                                  +---------+---------------+---------+-----------+---------------+--------------+  FV Distal Full                                                                  +---------+---------------+---------+-----------+---------------+--------------+  PFV       Full                                                                  +---------+---------------+---------+-----------+---------------+--------------+  POP       Full            Yes       Yes                                         +---------+---------------+---------+-----------+---------------+--------------+  PTV       Full                                                                  +---------+---------------+---------+-----------+---------------+--------------+  PERO      None                                  softly          Acute                                                            echogenic                       +---------+---------------+---------+-----------+---------------+--------------+   +---------+---------------+---------+-----------+---------------+--------------+  LEFT      Compressibility Phasicity Spontaneity Properties      Thrombus Aging   +---------+---------------+---------+-----------+---------------+--------------+  CFV       Full            Yes       Yes                                         +---------+---------------+---------+-----------+---------------+--------------+  SFJ       Full                                                                  +---------+---------------+---------+-----------+---------------+--------------+  FV Prox   Full                                                                  +---------+---------------+---------+-----------+---------------+--------------+  FV Mid    Full                                                                  +---------+---------------+---------+-----------+---------------+--------------+  FV Distal Full                                                                  +---------+---------------+---------+-----------+---------------+--------------+  PFV       Full                                                                  +---------+---------------+---------+-----------+---------------+--------------+  POP  Full            Yes       Yes                                         +---------+---------------+---------+-----------+---------------+--------------+  Gastroc   None                                  softly          Acute                                                            echogenic                       +---------+---------------+---------+-----------+---------------+--------------+     Summary: RIGHT: - Findings consistent with acute deep vein thrombosis involving the right peroneal veins. - No cystic structure found in the popliteal fossa.  LEFT: - Findings consistent with acute deep vein thrombosis involving the left gastrocnemius veins. - No cystic structure found in the popliteal fossa.  *See table(s) above for measurements and observations. Electronically signed by Monica Martinez MD on 08/09/2020 at 1:53:46 PM.    Final     Scheduled Meds:  amLODipine   10 mg Oral Daily   vitamin C  500 mg Oral Daily   baricitinib  4 mg Oral Daily   enoxaparin (LOVENOX) injection  1 mg/kg Subcutaneous Q12H   hydrochlorothiazide  25 mg Oral Daily   insulin aspart  0-20 Units Subcutaneous TID WC   insulin aspart  0-5 Units Subcutaneous QHS   insulin aspart  3 Units Subcutaneous TID WC   insulin detemir  10 Units Subcutaneous Daily   irbesartan  300 mg Oral Daily   linagliptin  5 mg Oral Daily   methylPREDNISolone (SOLU-MEDROL) injection  80 mg Intravenous Q12H   pantoprazole  40 mg Oral Daily   zinc sulfate  220 mg Oral Daily   Continuous Infusions:  sodium chloride 1,000 mL (08/09/20 0835)     LOS: 1 day   Time spent: 35 minutes.  Patrecia Pour, MD [facility] [email or web address] 08/09/2020, 5:56 PM

## 2020-08-10 LAB — CBC WITH DIFFERENTIAL/PLATELET
Abs Immature Granulocytes: 0.41 10*3/uL — ABNORMAL HIGH (ref 0.00–0.07)
Basophils Absolute: 0 10*3/uL (ref 0.0–0.1)
Basophils Relative: 0 %
Eosinophils Absolute: 0 10*3/uL (ref 0.0–0.5)
Eosinophils Relative: 0 %
HCT: 41.5 % (ref 39.0–52.0)
Hemoglobin: 14.5 g/dL (ref 13.0–17.0)
Immature Granulocytes: 5 %
Lymphocytes Relative: 7 %
Lymphs Abs: 0.6 10*3/uL — ABNORMAL LOW (ref 0.7–4.0)
MCH: 29.5 pg (ref 26.0–34.0)
MCHC: 34.9 g/dL (ref 30.0–36.0)
MCV: 84.5 fL (ref 80.0–100.0)
Monocytes Absolute: 0.4 10*3/uL (ref 0.1–1.0)
Monocytes Relative: 5 %
Neutro Abs: 6.8 10*3/uL (ref 1.7–7.7)
Neutrophils Relative %: 83 %
Platelets: 227 10*3/uL (ref 150–400)
RBC: 4.91 MIL/uL (ref 4.22–5.81)
RDW: 12.9 % (ref 11.5–15.5)
WBC: 8.2 10*3/uL (ref 4.0–10.5)
nRBC: 0 % (ref 0.0–0.2)

## 2020-08-10 LAB — COMPREHENSIVE METABOLIC PANEL
ALT: 41 U/L (ref 0–44)
AST: 28 U/L (ref 15–41)
Albumin: 2.6 g/dL — ABNORMAL LOW (ref 3.5–5.0)
Alkaline Phosphatase: 60 U/L (ref 38–126)
Anion gap: 10 (ref 5–15)
BUN: 36 mg/dL — ABNORMAL HIGH (ref 8–23)
CO2: 23 mmol/L (ref 22–32)
Calcium: 8.2 mg/dL — ABNORMAL LOW (ref 8.9–10.3)
Chloride: 101 mmol/L (ref 98–111)
Creatinine, Ser: 1.13 mg/dL (ref 0.61–1.24)
GFR calc Af Amer: >60 mL/min (ref >60)
GFR calc non Af Amer: >60 mL/min (ref >60)
Glucose, Bld: 254 mg/dL — ABNORMAL HIGH (ref 70–99)
Potassium: 4 mmol/L (ref 3.5–5.1)
Sodium: 134 mmol/L — ABNORMAL LOW (ref 135–145)
Total Bilirubin: 1 mg/dL (ref 0.3–1.2)
Total Protein: 5.6 g/dL — ABNORMAL LOW (ref 6.5–8.1)

## 2020-08-10 LAB — GLUCOSE, CAPILLARY
Glucose-Capillary: 256 mg/dL — ABNORMAL HIGH (ref 70–99)
Glucose-Capillary: 295 mg/dL — ABNORMAL HIGH (ref 70–99)
Glucose-Capillary: 315 mg/dL — ABNORMAL HIGH (ref 70–99)
Glucose-Capillary: 379 mg/dL — ABNORMAL HIGH (ref 70–99)

## 2020-08-10 LAB — C-REACTIVE PROTEIN: CRP: 4.6 mg/dL — ABNORMAL HIGH (ref <1.0)

## 2020-08-10 LAB — D-DIMER, QUANTITATIVE: D-Dimer, Quant: 10.56 ug/mL-FEU — ABNORMAL HIGH (ref 0.00–0.50)

## 2020-08-10 MED ORDER — INSULIN DETEMIR 100 UNIT/ML ~~LOC~~ SOLN
20.0000 [IU] | Freq: Every day | SUBCUTANEOUS | Status: DC
  Administered 2020-08-10: 20 [IU] via SUBCUTANEOUS
  Filled 2020-08-10 (×2): qty 0.2

## 2020-08-10 MED ORDER — INSULIN ASPART 100 UNIT/ML ~~LOC~~ SOLN
6.0000 [IU] | Freq: Three times a day (TID) | SUBCUTANEOUS | Status: DC
  Administered 2020-08-10 – 2020-08-14 (×11): 6 [IU] via SUBCUTANEOUS

## 2020-08-10 MED ORDER — ENOXAPARIN SODIUM 100 MG/ML ~~LOC~~ SOLN
1.0000 mg/kg | Freq: Two times a day (BID) | SUBCUTANEOUS | Status: DC
  Administered 2020-08-10 – 2020-08-14 (×8): 85 mg via SUBCUTANEOUS
  Filled 2020-08-10 (×8): qty 0.85

## 2020-08-10 NOTE — Progress Notes (Signed)
Patient came up on 6L O2 but his O2 sat was between 86-88%.  Placed the patient on a High-Flow Nasal Cannula at 15 L O2.  His sats went up to 95%.  Will continue to monitor.  Lupita Dawn, RN

## 2020-08-10 NOTE — Progress Notes (Signed)
PROGRESS NOTE  Tim Day  EXN:[id number] DOB: 1951/02/12 DOA: 08/08/2020 PCP: Celene Squibb, MD   Brief Narrative: Tim Day is a 69 y.o. male with a history of T2DM, HTN, HLD, GERD, and recent hospitalization for covid-19 pneumonia (discharged 9/3 on 6LPM O2; completed remdesivir infusions as outpatient 9/5) who reported worsening shortness of breath despite taking decadron as directed. In the ED 9/7, he was hypoxemic requiring 15L NRB with tachypnea, lactic acid elevation, CRP 2.2, and d-dimer >20. CTA chest showed no evidence of PE but demonstrated widespread airspace opacities and mild pneumomediastinum. He was readmitted for acute hypoxemic respiratory failure due to covid-19 multifocal pneumonia, given IV steroids, baricitinib, and empiric full dose lovenox. Subsequent LE venous U/S confirmed presence of acute bilateral DVTs.   Assessment & Plan: Principal Problem:   Acute hypoxemic respiratory failure due to COVID-19 [facility]) Active Problems:   DM2 (diabetes mellitus, type 2) (HCC)   Benign essential HTN   Dyslipidemia   Esophageal reflux   Sepsis ([place])   Hyponatremia  Acute hypoxemic respiratory failure due to covid-19 pneumonia: SARS-CoV-2 PCR positive on 9/1 s/p remdesivir 9/1 - 9/5, not vaccinated - Restarted steroids, augment solumedrol due to elevated CRP - Continue baricitinib, started 9/7, for 14 days or until hospital discharge. Monitor Cr, LFTs, differential. Will keep on full dose anticoagulation fur the duration of therapy.  - Encourage OOB, IS, FV, and awake proning if able - Monitor CMP and inflammatory markers - Encouraged to get vaccine after isolation period ends (9/22) - PT/OT consulted   Acute bilateral DVTs in the right peroneal veins and left gastrocnemius veins: Provoked by covid infection.  - Therapeutic lovenox 1mg /kg q12h, slight decrease to dose starting this evening based on updated weight. No bleeding. CTA chest without PE, though  index of suspicion remains high.   Pneumomediastinum:  - Supportive care, not severe, though certainly a marker of severity of pulmonary injury. In personal experience, this suggests a protracted recovery period.  T2DM with steroid-induced hyperglycemia:  - Increase levemir 10u > 20u, 3u TIDWC > 6u TIDWC, continue resistant SSI and linagliptin. Titrate as needed.  GERD:  - Continue PPI  HTN:  - Continue home medications  DVT prophylaxis: Lovenox 1mg /kg q12h Code Status: Full, confirmed Family Communication: Wife by phone today Disposition Plan:  Status is: Inpatient  Remains inpatient appropriate because:Inpatient level of care appropriate due to severity of illness   Dispo: The patient is from: Home              Anticipated d/c is to: Home              Anticipated d/c date is: > 3 days              Patient currently is not medically stable to d/c.  Consultants:   None  Procedures:   None  Antimicrobials:  None   Subjective: Shortness of breath is stable, constant, worse with exertion, better with oxygen, though he feels a little stronger today. Getting to EOB without assistance which is an improvement. No wheezing, no chest pain, no orthopnea, no leg tenderness or swelling.   Objective: Vitals:   08/10/20 0615 08/10/20 0929 08/10/20 1236 08/10/20 1339  BP: 107/71 (!) 113/59 106/64   Pulse: 77 81 84   Resp: (!) 25 (!) 24 20   Temp: 97.8 F (36.6 C) (!) 97.4 F (36.3 C) (!) 97.5 F (36.4 C)   TempSrc: Oral Oral Oral   SpO2: 95% 93%  100% 95%  Weight:      Height:        Intake/Output Summary (Last 24 hours) at 08/10/2020 1518 Last data filed at 08/10/2020 1238 Gross per 24 hour  Intake 290 ml  Output 1000 ml  Net -710 ml   Filed Weights   08/08/20 1139 08/09/20 2117  Weight: 90.7 kg 84.8 kg   Gen: 69 y.o. male in no distress Pulm: Nonlabored tachypnea on HFNC. Crackles stable. CV: Regular rate and rhythm. No murmur, rub, or gallop. No JVD, no  dependent edema. GI: Abdomen soft, non-tender, non-distended, with normoactive bowel sounds.  Ext: Warm, no deformities. Negative Homan's. Skin: No rashes, lesions or ulcers on visualized skin. Neuro: Alert and oriented. No focal neurological deficits. Psych: Judgement and insight appear fair. Mood euthymic & affect congruent. Behavior is appropriate.    Data Reviewed: I have personally reviewed following labs and the CTA of the chest.  CBC: Recent Labs  Lab 08/04/20 0526 08/08/20 1144 08/08/20 1616 08/09/20 0351 08/10/20 0607  WBC 3.4* 12.9* 12.0* 6.3 8.2  NEUTROABS 2.4  --   --  5.4 6.8  HGB 14.1 15.8 16.0 14.3 14.5  HCT 41.5 44.9 47.0 41.2 41.5  MCV 86.6 83.8 84.7 84.1 84.5  PLT 125* 257 229 211 604   Basic Metabolic Panel: Recent Labs  Lab 08/04/20 0526 08/08/20 1144 08/08/20 1616 08/09/20 0351 08/10/20 0607  NA 132* 132*  --  132* 134*  K 4.5 4.4  --  4.6 4.0  CL 100 102  --  102 101  CO2 23 17*  --  19* 23  GLUCOSE 254* 358*  --  305* 254*  BUN 22 36*  --  30* 36*  CREATININE 0.80 1.00 0.96 0.86 1.13  CALCIUM 7.9* 8.9  --  8.3* 8.2*  MG 2.0  --   --  2.2  --   PHOS 3.5  --   --  3.9  --    GFR: Estimated Creatinine Clearance: 65.5 mL/min (by C-G formula based on SCr of 1.13 mg/dL). Liver Function Tests: Recent Labs  Lab 08/04/20 0526 08/09/20 0351 08/10/20 0607  AST 58* 23 28  ALT 65* 36 41  ALKPHOS 38 55 60  BILITOT 0.9 0.9 1.0  PROT 5.9* 6.1* 5.6*  ALBUMIN 2.9* 2.7* 2.6*   No results for input(s): LIPASE, AMYLASE in the last 168 hours. No results for input(s): AMMONIA in the last 168 hours. Coagulation Profile: No results for input(s): INR, PROTIME in the last 168 hours. Cardiac Enzymes: No results for input(s): CKTOTAL, CKMB, CKMBINDEX, TROPONINI in the last 168 hours. BNP (last 3 results) No results for input(s): PROBNP in the last 8760 hours. HbA1C: Recent Labs    08/08/20 1616  HGBA1C 7.2*   CBG: Recent Labs  Lab 08/09/20 1354  08/09/20 1816 08/09/20 2118 08/10/20 0614 08/10/20 1233  GLUCAP 304* 341* 315* 256* 295*   Lipid Profile: Recent Labs    08/08/20 1356  TRIG 403*   Thyroid Function Tests: No results for input(s): TSH, T4TOTAL, FREET4, T3FREE, THYROIDAB in the last 72 hours. Anemia Panel: Recent Labs    08/08/20 1356 08/09/20 0351  FERRITIN 944* 714*   Urine analysis:    Component Value Date/Time   COLORURINE YELLOW 01/21/2015 1822   APPEARANCEUR CLEAR 01/21/2015 1822   LABSPEC 1.021 01/21/2015 1822   PHURINE 5.5 01/21/2015 1822   GLUCOSEU NEGATIVE 01/21/2015 1822   HGBUR NEGATIVE 01/21/2015 1822   BILIRUBINUR NEGATIVE 01/21/2015 1822   [employer]  NEGATIVE 01/21/2015 1822   PROTEINUR NEGATIVE 01/21/2015 1822   UROBILINOGEN 0.2 01/21/2015 1822   NITRITE NEGATIVE 01/21/2015 1822   LEUKOCYTESUR NEGATIVE 01/21/2015 1822   Recent Results (from the past 240 hour(s))  SARS Coronavirus 2 by RT PCR (hospital order, performed in [facility] hospital lab) Nasopharyngeal Nasopharyngeal Swab     Status: Abnormal   Collection Time: 08/02/20  8:48 PM   Specimen: Nasopharyngeal Swab  Result Value Ref Range Status   SARS Coronavirus 2 POSITIVE (A) NEGATIVE Final    Comment: CRITICAL RESULT CALLED TO, READ BACK BY AND VERIFIED WITH: GIBSON,RN AT 2210 ON 9.1.21 BY ISLEY,B (NOTE) SARS-CoV-2 target nucleic acids are DETECTED  SARS-CoV-2 RNA is generally detectable in upper respiratory specimens  during the acute phase of infection.  Positive results are indicative  of the presence of the identified virus, but do not rule out bacterial infection or co-infection with other pathogens not detected by the test.  Clinical correlation with patient history and  other diagnostic information is necessary to determine patient infection status.  The expected result is negative.  Fact Sheet for Patients:   StrictlyIdeas.no   Fact Sheet for Healthcare Providers:     BankingDealers.co.za    This test is not yet approved or cleared by the [place] FDA and  has been authorized for detection and/or diagnosis of SARS-CoV-2 by FDA under an Emergency Use Authorization (EUA).  This EUA will remain in effect (me aning this test can be used) for the duration of  the COVID-19 declaration under Section 564(b)(1) of the Act, 21 U.S.C. section 360-bbb-3(b)(1), unless the authorization is terminated or revoked sooner.  Performed at [facility], [address], [place], [place] [address]   Blood Culture (routine x 2)     Status: None   Collection Time: 08/02/20 10:06 PM   Specimen: BLOOD  Result Value Ref Range Status   Specimen Description BLOOD RIGHT ANTECUBITAL  Final   Special Requests   Final    BOTTLES DRAWN AEROBIC AND ANAEROBIC Blood Culture adequate volume   Culture   Final    NO GROWTH 5 DAYS Performed at [facility], [address] [place], [place] [address]    Report Status 08/07/2020 FINAL  Final  Blood Culture (routine x 2)     Status: None   Collection Time: 08/02/20 10:24 PM   Specimen: BLOOD  Result Value Ref Range Status   Specimen Description BLOOD RIGHT ANTECUBITAL  Final   Special Requests   Final    BOTTLES DRAWN AEROBIC AND ANAEROBIC Blood Culture adequate volume   Culture   Final    NO GROWTH 5 DAYS Performed at [facility], [address] [place], [place] [address]    Report Status 08/08/2020 FINAL  Final  Blood Culture (routine x 2)     Status: None (Preliminary result)   Collection Time: 08/08/20  1:56 PM   Specimen: BLOOD  Result Value Ref Range Status   Specimen Description BLOOD RIGHT ANTECUBITAL  Final   Special Requests   Final    BOTTLES DRAWN AEROBIC AND ANAEROBIC Blood Culture adequate volume   Culture   Final    NO GROWTH 2 DAYS Performed at [facility], [place] [address] [place], [place] [address]    Report Status PENDING  Incomplete  Blood Culture (routine x 2)     Status:  None (Preliminary result)   Collection Time: 08/08/20  1:56 PM   Specimen: BLOOD  Result Value Ref  Range Status   Specimen Description BLOOD LEFT ANTECUBITAL  Final   Special Requests   Final    BOTTLES DRAWN AEROBIC ONLY Blood Culture results may not be optimal due to an inadequate volume of blood received in culture bottles   Culture   Final    NO GROWTH 2 DAYS Performed at [place] [address] [place], [place] [address]    Report Status PENDING  Incomplete      Radiology Studies: CT Angio Chest PE W and/or Wo Contrast  Result Date: 08/08/2020 CLINICAL DATA:  COVID-19 positive, shortness of breath EXAM: CT ANGIOGRAPHY CHEST WITH CONTRAST TECHNIQUE: Multidetector CT imaging of the chest was performed using the standard protocol during bolus administration of intravenous contrast. Multiplanar CT image reconstructions and MIPs were obtained to evaluate the vascular anatomy. CONTRAST:  62mL OMNIPAQUE IOHEXOL 350 MG/ML SOLN COMPARISON:  08/08/2020, 08/02/2020 FINDINGS: Cardiovascular: This is a technically adequate evaluation of the pulmonary vasculature. No filling defects or pulmonary emboli. The heart is unremarkable without pericardial effusion. Mild atherosclerosis of the aorta and LAD distribution of the coronary vasculature. Mediastinum/Nodes: There is a small amount of pneumomediastinum anterior to the right hilum and left mainstem bronchus, likely result of barotrauma. No pathologic adenopathy. Thyroid, trachea, and esophagus are unremarkable. Lungs/Pleura: Multifocal bilateral areas of ground-glass airspace disease consistent with COVID 19 pneumonia. No effusion or pneumothorax. The central airways are patent. Upper Abdomen: No acute abnormality. Musculoskeletal: No acute or destructive bony lesions. Reconstructed images demonstrate no additional findings. Review of the MIP images confirms the above findings. IMPRESSION: 1. Widespread bilateral ground-glass airspace disease  consistent with multifocal COVID-19 pneumonia. 2. Minimal pneumomediastinum consistent with barotrauma. 3. No evidence of pulmonary embolus. 4.  Aortic Atherosclerosis (ICD10-I70.0). These results were called by telephone at the time of interpretation on 08/08/2020 at 3:32 pm to provider Antony Blackbird, who verbally acknowledged these results. Electronically Signed   By: Randa Ngo M.D.   On: 08/08/2020 15:33   VAS [place] LOWER EXTREMITY VENOUS (DVT)  Result Date: 08/09/2020  Lower Venous DVTStudy Indications: Elevated D dimer. Other Indications: + Covid. Performing Technologist: Griffin Basil RCT RDMS  Examination Guidelines: A complete evaluation includes B-mode imaging, spectral Doppler, color Doppler, and power Doppler as needed of all accessible portions of each vessel. Bilateral testing is considered an integral part of a complete examination. Limited examinations for reoccurring indications may be performed as noted. The reflux portion of the exam is performed with the patient in reverse Trendelenburg.  +---------+---------------+---------+-----------+---------------+--------------+  RIGHT     Compressibility Phasicity Spontaneity Properties      Thrombus Aging  +---------+---------------+---------+-----------+---------------+--------------+  CFV       Full            Yes       Yes                                         +---------+---------------+---------+-----------+---------------+--------------+  SFJ       Full                                                                  +---------+---------------+---------+-----------+---------------+--------------+  FV Prox   Full                                                                  +---------+---------------+---------+-----------+---------------+--------------+  FV Mid    Full                                                                  +---------+---------------+---------+-----------+---------------+--------------+  FV Distal Full                                                                   +---------+---------------+---------+-----------+---------------+--------------+  PFV       Full                                                                  +---------+---------------+---------+-----------+---------------+--------------+  POP       Full            Yes       Yes                                         +---------+---------------+---------+-----------+---------------+--------------+  PTV       Full                                                                  +---------+---------------+---------+-----------+---------------+--------------+  PERO      None                                  softly          Acute                                                            echogenic                       +---------+---------------+---------+-----------+---------------+--------------+   +---------+---------------+---------+-----------+---------------+--------------+  LEFT      Compressibility Phasicity Spontaneity Properties      Thrombus Aging  +---------+---------------+---------+-----------+---------------+--------------+  CFV       Full            Yes       Yes                                         +---------+---------------+---------+-----------+---------------+--------------+  SFJ       Full                                                                  +---------+---------------+---------+-----------+---------------+--------------+  FV Prox   Full                                                                  +---------+---------------+---------+-----------+---------------+--------------+  FV Mid    Full                                                                  +---------+---------------+---------+-----------+---------------+--------------+  FV Distal Full                                                                  +---------+---------------+---------+-----------+---------------+--------------+  PFV       Full                                                                   +---------+---------------+---------+-----------+---------------+--------------+  POP       Full            Yes       Yes                                         +---------+---------------+---------+-----------+---------------+--------------+  Gastroc   None                                  softly          Acute                                                            echogenic                       +---------+---------------+---------+-----------+---------------+--------------+     Summary: RIGHT: - Findings consistent with acute deep vein thrombosis involving the right peroneal veins. - No cystic structure found in the popliteal fossa.  LEFT: - Findings consistent with acute deep vein thrombosis involving the left gastrocnemius veins. - No cystic structure found in the popliteal fossa.  *See table(s) above for measurements and observations. Electronically signed by Monica Martinez MD on 08/09/2020 at 1:53:46 PM.    Final     Scheduled Meds:  amLODipine  10 mg Oral Daily   vitamin C  500 mg Oral Daily   baricitinib  4 mg Oral Daily   enoxaparin (LOVENOX)  injection  1 mg/kg Subcutaneous Q12H   hydrochlorothiazide  25 mg Oral Daily   insulin aspart  0-20 Units Subcutaneous TID WC   insulin aspart  0-5 Units Subcutaneous QHS   insulin aspart  6 Units Subcutaneous TID WC   insulin detemir  20 Units Subcutaneous Daily   irbesartan  300 mg Oral Daily   linagliptin  5 mg Oral Daily   methylPREDNISolone (SOLU-MEDROL) injection  80 mg Intravenous Q12H   pantoprazole  40 mg Oral Daily   zinc sulfate  220 mg Oral Daily   Continuous Infusions:    LOS: 2 days   Time spent: 35 minutes.  Patrecia Pour, MD [facility] [email or web address] 08/10/2020, 3:18 PM

## 2020-08-11 LAB — CBC WITH DIFFERENTIAL/PLATELET
Abs Immature Granulocytes: 0.36 10*3/uL — ABNORMAL HIGH (ref 0.00–0.07)
Basophils Absolute: 0 10*3/uL (ref 0.0–0.1)
Basophils Relative: 0 %
Eosinophils Absolute: 0 10*3/uL (ref 0.0–0.5)
Eosinophils Relative: 0 %
HCT: 41.3 % (ref 39.0–52.0)
Hemoglobin: 14.6 g/dL (ref 13.0–17.0)
Immature Granulocytes: 4 %
Lymphocytes Relative: 6 %
Lymphs Abs: 0.6 10*3/uL — ABNORMAL LOW (ref 0.7–4.0)
MCH: 29.5 pg (ref 26.0–34.0)
MCHC: 35.4 g/dL (ref 30.0–36.0)
MCV: 83.4 fL (ref 80.0–100.0)
Monocytes Absolute: 0.4 10*3/uL (ref 0.1–1.0)
Monocytes Relative: 5 %
Neutro Abs: 8 10*3/uL — ABNORMAL HIGH (ref 1.7–7.7)
Neutrophils Relative %: 85 %
Platelets: 243 10*3/uL (ref 150–400)
RBC: 4.95 MIL/uL (ref 4.22–5.81)
RDW: 12.8 % (ref 11.5–15.5)
WBC: 9.4 10*3/uL (ref 4.0–10.5)
nRBC: 0 % (ref 0.0–0.2)

## 2020-08-11 LAB — C-REACTIVE PROTEIN: CRP: 1.7 mg/dL — ABNORMAL HIGH (ref <1.0)

## 2020-08-11 LAB — COMPREHENSIVE METABOLIC PANEL
ALT: 43 U/L (ref 0–44)
AST: 29 U/L (ref 15–41)
Albumin: 2.6 g/dL — ABNORMAL LOW (ref 3.5–5.0)
Alkaline Phosphatase: 55 U/L (ref 38–126)
Anion gap: 10 (ref 5–15)
BUN: 35 mg/dL — ABNORMAL HIGH (ref 8–23)
CO2: 20 mmol/L — ABNORMAL LOW (ref 22–32)
Calcium: 8.2 mg/dL — ABNORMAL LOW (ref 8.9–10.3)
Chloride: 102 mmol/L (ref 98–111)
Creatinine, Ser: 0.98 mg/dL (ref 0.61–1.24)
GFR calc Af Amer: >60 mL/min (ref >60)
GFR calc non Af Amer: >60 mL/min (ref >60)
Glucose, Bld: 208 mg/dL — ABNORMAL HIGH (ref 70–99)
Potassium: 4.4 mmol/L (ref 3.5–5.1)
Sodium: 132 mmol/L — ABNORMAL LOW (ref 135–145)
Total Bilirubin: 0.8 mg/dL (ref 0.3–1.2)
Total Protein: 5.6 g/dL — ABNORMAL LOW (ref 6.5–8.1)

## 2020-08-11 LAB — GLUCOSE, CAPILLARY
Glucose-Capillary: 257 mg/dL — ABNORMAL HIGH (ref 70–99)
Glucose-Capillary: 319 mg/dL — ABNORMAL HIGH (ref 70–99)
Glucose-Capillary: 308 mg/dL — ABNORMAL HIGH (ref 70–99)
Glucose-Capillary: 337 mg/dL — ABNORMAL HIGH (ref 70–99)

## 2020-08-11 MED ORDER — SALINE SPRAY 0.65 % NA SOLN
1.0000 | NASAL | Status: DC | PRN
  Administered 2020-08-11: 1 via NASAL
  Filled 2020-08-11: qty 44

## 2020-08-11 MED ORDER — INSULIN DETEMIR 100 UNIT/ML ~~LOC~~ SOLN
25.0000 [IU] | Freq: Every day | SUBCUTANEOUS | Status: DC
  Administered 2020-08-11 – 2020-08-14 (×4): 25 [IU] via SUBCUTANEOUS
  Filled 2020-08-11 (×4): qty 0.25

## 2020-08-11 MED ORDER — OXYMETAZOLINE HCL 0.05 % NA SOLN
1.0000 | Freq: Two times a day (BID) | NASAL | Status: AC
  Administered 2020-08-11 – 2020-08-13 (×5): 1 via NASAL
  Filled 2020-08-11: qty 30

## 2020-08-11 MED ORDER — METHYLPREDNISOLONE SODIUM SUCC 125 MG IJ SOLR
60.0000 mg | Freq: Two times a day (BID) | INTRAMUSCULAR | Status: DC
  Administered 2020-08-11 – 2020-08-12 (×3): 60 mg via INTRAVENOUS
  Filled 2020-08-11 (×3): qty 2

## 2020-08-11 MED ORDER — OXYMETAZOLINE HCL 0.05 % NA SOLN: 1.0000 | Freq: Two times a day (BID) | NASAL | Status: DC

## 2020-08-11 NOTE — Progress Notes (Addendum)
PROGRESS NOTE  Joden Bonsall Ontiveros  HWE:[id number] DOB: June 26, 1951 DOA: 08/08/2020 PCP: Celene Squibb, MD   Brief Narrative: Kassidy Frankson is a 69 y.o. male with a history of T2DM, HTN, HLD, GERD, and recent hospitalization for covid-19 pneumonia (discharged 9/3 on 6LPM O2; completed remdesivir infusions as outpatient 9/5) who reported worsening shortness of breath despite taking decadron as directed. In the ED 9/7, he was hypoxemic requiring 15L NRB with tachypnea, lactic acid elevation, CRP 2.2, and d-dimer >20. CTA chest showed no evidence of PE but demonstrated widespread airspace opacities and mild pneumomediastinum. He was readmitted for acute hypoxemic respiratory failure due to covid-19 multifocal pneumonia, given IV steroids, baricitinib, and empiric full dose lovenox. Subsequent LE venous U/S confirmed presence of acute bilateral DVTs.   Assessment & Plan: Principal Problem:   Acute hypoxemic respiratory failure due to COVID-19 [facility]) Active Problems:   DM2 (diabetes mellitus, type 2) (HCC)   Benign essential HTN   Dyslipidemia   Esophageal reflux   Sepsis ([place])   Hyponatremia  Acute hypoxemic respiratory failure due to covid-19 pneumonia: SARS-CoV-2 PCR positive on 9/1 s/p remdesivir 9/1 - 9/5, not vaccinated - Restarted steroids, can decrease with good response of CRP, hypoxia, continued baricitinib, and ongoing hyperglycemia. - Continue baricitinib, started 9/7, for 14 days or until hospital discharge. Monitor Cr, LFTs, differential. Will be on full dose anticoagulation fur the duration of therapy.  - Encourage OOB, IS, FV, and awake proning if able - Monitor CMP and inflammatory markers - Encouraged to get vaccine after isolation period ends (9/23) - PT/OT consulted to facilitate mobility.  Acute bilateral DVTs in the right peroneal veins and left gastrocnemius veins: Provoked by covid infection.  - Therapeutic lovenox 1mg /kg q12h. CTA chest without PE. -  Monitor CBC, treat epistaxis as below.   Epistaxis:  - Nasal saline, time-limited afrin, hopefully wean from such high O2 flow rates.   Pneumomediastinum:  - Supportive care, not severe, though certainly a marker of severity of pulmonary injury. Suggests a protracted recovery period.  T2DM with steroid-induced hyperglycemia: HbA1c 7.2%.  - Increase levemir to 25u daily, continue 6u TIDWC and resistant SSI and linagliptin. Will continue titration. Decreasing steroids as above will hopefully help as his previous glycemic control (HbA1c 7.2%) suggests reasonable control without insulin at home.   GERD:  - Continue PPI  HTN:  - Continue home medications  DVT prophylaxis: Lovenox 1mg /kg q12h Code Status: Full, confirmed Family Communication: Wife daily Disposition Plan:  Status is: Inpatient  Remains inpatient appropriate because:Inpatient level of care appropriate due to severity of illness   Dispo: The patient is from: Home              Anticipated d/c is to: Home              Anticipated d/c date is: > 3 days              Patient currently is not medically stable to d/c.  Consultants:   None  Procedures:   None  Antimicrobials:  None   Subjective: Continues to have moderate-severe shortness of breath with any exertion that is constant and improves slowly with rest. Has come down to 10L HFNC. No chest pain, orthopnea.   Objective: Vitals:   08/10/20 2020 08/10/20 2349 08/11/20 0633 08/11/20 0902  BP: 129/67 118/72 109/62 116/74  Pulse: 89 81 66 79  Resp: (!) 24 (!) 24 18 20   Temp: 97.6 F (36.4 C) 97.6 F (36.4 C)  97.7 F (36.5 C) 97.8 F (36.6 C)  TempSrc: Axillary Oral Oral Oral  SpO2: 94% 95% 92% 95%  Weight:      Height:        Intake/Output Summary (Last 24 hours) at 08/11/2020 1350 Last data filed at 08/11/2020 0626 Gross per 24 hour  Intake 835 ml  Output 1425 ml  Net -590 ml   Filed Weights   08/08/20 1139 08/09/20 2117  Weight: 90.7 kg 84.8  kg   Gen: 69 y.o. male in no distress Pulm: Nonlabored tachypneic with crackles bilaterally. 10L HFNC. More respiratory stress just with sitting up for lung exam. CV: Regular rate and rhythm. No murmur, rub, or gallop. No JVD, no dependent edema. GI: Abdomen soft, non-tender, non-distended, with normoactive bowel sounds.  Ext: Warm, no deformities Skin: No rashes, lesions or ulcers on visualized skin. Neuro: Alert and oriented. No focal neurological deficits. Psych: Judgement and insight appear fair. Mood euthymic & affect congruent. Behavior is appropriate.    Data Reviewed: I have personally reviewed following labs and the CTA of the chest.  CBC: Recent Labs  Lab 08/08/20 1144 08/08/20 1616 08/09/20 0351 08/10/20 0607 08/11/20 0256  WBC 12.9* 12.0* 6.3 8.2 9.4  NEUTROABS  --   --  5.4 6.8 8.0*  HGB 15.8 16.0 14.3 14.5 14.6  HCT 44.9 47.0 41.2 41.5 41.3  MCV 83.8 84.7 84.1 84.5 83.4  PLT 257 229 211 227 948   Basic Metabolic Panel: Recent Labs  Lab 08/08/20 1144 08/08/20 1616 08/09/20 0351 08/10/20 0607 08/11/20 0256  NA 132*  --  132* 134* 132*  K 4.4  --  4.6 4.0 4.4  CL 102  --  102 101 102  CO2 17*  --  19* 23 20*  GLUCOSE 358*  --  305* 254* 208*  BUN 36*  --  30* 36* 35*  CREATININE 1.00 0.96 0.86 1.13 0.98  CALCIUM 8.9  --  8.3* 8.2* 8.2*  MG  --   --  2.2  --   --   PHOS  --   --  3.9  --   --    GFR: Estimated Creatinine Clearance: 75.5 mL/min (by C-G formula based on SCr of 0.98 mg/dL). Liver Function Tests: Recent Labs  Lab 08/09/20 0351 08/10/20 0607 08/11/20 0256  AST 23 28 29   ALT 36 41 43  ALKPHOS 55 60 55  BILITOT 0.9 1.0 0.8  PROT 6.1* 5.6* 5.6*  ALBUMIN 2.7* 2.6* 2.6*   HbA1C: Recent Labs    08/08/20 1616  HGBA1C 7.2*   CBG: Recent Labs  Lab 08/10/20 1233 08/10/20 1702 08/10/20 2053 08/11/20 0636 08/11/20 1259  GLUCAP 295* 315* 379* 257* 319*   Lipid Profile: Recent Labs    08/08/20 1356  TRIG 403*   Thyroid  Function Tests: No results for input(s): TSH, T4TOTAL, FREET4, T3FREE, THYROIDAB in the last 72 hours. Anemia Panel: Recent Labs    08/08/20 1356 08/09/20 0351  FERRITIN 944* 714*   Urine analysis:    Component Value Date/Time   COLORURINE YELLOW 01/21/2015 1822   APPEARANCEUR CLEAR 01/21/2015 1822   LABSPEC 1.021 01/21/2015 1822   PHURINE 5.5 01/21/2015 1822   GLUCOSEU NEGATIVE 01/21/2015 1822   HGBUR NEGATIVE 01/21/2015 1822   BILIRUBINUR NEGATIVE 01/21/2015 1822   KETONESUR NEGATIVE 01/21/2015 1822   PROTEINUR NEGATIVE 01/21/2015 1822   UROBILINOGEN 0.2 01/21/2015 1822   NITRITE NEGATIVE 01/21/2015 1822   LEUKOCYTESUR NEGATIVE 01/21/2015 1822   Recent Results (from  the past 240 hour(s))  SARS Coronavirus 2 by RT PCR (hospital order, performed in [facility] hospital lab) Nasopharyngeal Nasopharyngeal Swab     Status: Abnormal   Collection Time: 08/02/20  8:48 PM   Specimen: Nasopharyngeal Swab  Result Value Ref Range Status   SARS Coronavirus 2 POSITIVE (A) NEGATIVE Final    Comment: CRITICAL RESULT CALLED TO, READ BACK BY AND VERIFIED WITH: GIBSON,RN AT 2210 ON 9.1.21 BY ISLEY,B (NOTE) SARS-CoV-2 target nucleic acids are DETECTED  SARS-CoV-2 RNA is generally detectable in upper respiratory specimens  during the acute phase of infection.  Positive results are indicative  of the presence of the identified virus, but do not rule out bacterial infection or co-infection with other pathogens not detected by the test.  Clinical correlation with patient history and  other diagnostic information is necessary to determine patient infection status.  The expected result is negative.  Fact Sheet for Patients:   StrictlyIdeas.no   Fact Sheet for Healthcare Providers:   BankingDealers.co.za    This test is not yet approved or cleared by the [place] FDA and  has been authorized for detection and/or diagnosis of SARS-CoV-2  by FDA under an Emergency Use Authorization (EUA).  This EUA will remain in effect (me aning this test can be used) for the duration of  the COVID-19 declaration under Section 564(b)(1) of the Act, 21 U.S.C. section 360-bbb-3(b)(1), unless the authorization is terminated or revoked sooner.  Performed at [facility], [address], [place], [place] [address]   Blood Culture (routine x 2)     Status: None   Collection Time: 08/02/20 10:06 PM   Specimen: BLOOD  Result Value Ref Range Status   Specimen Description BLOOD RIGHT ANTECUBITAL  Final   Special Requests   Final    BOTTLES DRAWN AEROBIC AND ANAEROBIC Blood Culture adequate volume   Culture   Final    NO GROWTH 5 DAYS Performed at [facility], [address] [place], [place] [address]    Report Status 08/07/2020 FINAL  Final  Blood Culture (routine x 2)     Status: None   Collection Time: 08/02/20 10:24 PM   Specimen: BLOOD  Result Value Ref Range Status   Specimen Description BLOOD RIGHT ANTECUBITAL  Final   Special Requests   Final    BOTTLES DRAWN AEROBIC AND ANAEROBIC Blood Culture adequate volume   Culture   Final    NO GROWTH 5 DAYS Performed at [facility], [address], [place], [place] [address]    Report Status 08/08/2020 FINAL  Final  Blood Culture (routine x 2)     Status: None (Preliminary result)   Collection Time: 08/08/20  1:56 PM   Specimen: BLOOD  Result Value Ref Range Status   Specimen Description BLOOD RIGHT ANTECUBITAL  Final   Special Requests   Final    BOTTLES DRAWN AEROBIC AND ANAEROBIC Blood Culture adequate volume   Culture   Final    NO GROWTH 3 DAYS Performed at [facility], [place] [address] [place], [place] [address]    Report Status PENDING  Incomplete  Blood Culture (routine x 2)     Status: None (Preliminary result)   Collection Time: 08/08/20  1:56 PM   Specimen: BLOOD  Result Value Ref Range Status   Specimen Description BLOOD LEFT ANTECUBITAL  Final   Special  Requests   Final    BOTTLES DRAWN AEROBIC ONLY Blood Culture results may not be optimal due  to an inadequate volume of blood received in culture bottles   Culture   Final    NO GROWTH 3 DAYS Performed at [facility], [place] [address], [place],  [address]    Report Status PENDING  Incomplete      Radiology Studies: No results found.  Scheduled Meds: . amLODipine  10 mg Oral Daily  . vitamin C  500 mg Oral Daily  . baricitinib  4 mg Oral Daily  . enoxaparin (LOVENOX) injection  1 mg/kg Subcutaneous Q12H  . hydrochlorothiazide  25 mg Oral Daily  . insulin aspart  0-20 Units Subcutaneous TID WC  . insulin aspart  0-5 Units Subcutaneous QHS  . insulin aspart  6 Units Subcutaneous TID WC  . insulin detemir  25 Units Subcutaneous Daily  . irbesartan  300 mg Oral Daily  . linagliptin  5 mg Oral Daily  . methylPREDNISolone (SOLU-MEDROL) injection  60 mg Intravenous Q12H  . pantoprazole  40 mg Oral Daily  . zinc sulfate  220 mg Oral Daily   Continuous Infusions:    LOS: 3 days   Time spent: 35 minutes.  Patrecia Pour, MD [facility] [email or web address] 08/11/2020, 1:50 PM

## 2020-08-11 NOTE — Progress Notes (Signed)
Inpatient Diabetes Program Recommendations  AACE/ADA: New Consensus Statement on Inpatient Glycemic Control (2015)  Target Ranges:  Prepandial:   less than 140 mg/dL      Peak postprandial:   less than 180 mg/dL (1-2 hours)      Critically ill patients:  140 - 180 mg/dL   Lab Results  Component Value Date   GLUCAP 257 (H) 08/11/2020   HGBA1C 7.2 (H) 08/08/2020    Review of Glycemic Control Results for JAXSUN, CIAMPI (MRN [id number]) as of 08/11/2020 09:45  Ref. Range 08/10/2020 06:14 08/10/2020 12:33 08/10/2020 17:02 08/10/2020 20:53 08/11/2020 06:36  Glucose-Capillary Latest Ref Range: 70 - 99 mg/dL 256 (H) 295 (H) 315 (H) 379 (H) 257 (H)   Diabetes history: DM 2 Outpatient Diabetes medications: Trulicity 1.5 mg Weekly, Glipizide 5 mg bid, metformin 1000 mg bid Current orders for Inpatient glycemic control:  Levemir 20 units Daily Novolog 0-20 units tid + hs Novolog 6 units tid meal coverage Tradjenta 5 mg Daily  Solumedrol 60 mg Q12 hours Inpatient Diabetes Program Recommendations:    -  Increase Levemir to 28-30 units. Give additional 8 units today.  Thanks,  Tama Headings RN, MSN, BC-ADM Inpatient Diabetes Coordinator Team Pager [phone] (8a-5p)

## 2020-08-11 NOTE — Evaluation (Signed)
Occupational Therapy Evaluation Patient Details Name: Tim Day MRN: [id number] DOB: 01/13/1951 Today's Date: 08/11/2020    History of Present Illness  69 y.o. male  admitted to [facility] due to acute hypoxemic respiratory failure from covid-19 multifocal pneumonia, given IV steroids, baricitinib, and empiric full dose lovenox. 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 Pt currently with functional limitiations due to the deficits listed below (see OT problem list). Pt currently with oxygen desaturation 86% on 10 L HFNC. Pt is able to rebound with energy conservation and reports fatigue factor of 6 out 10 with DOE. Pt completes basic transfer min guard (A).  Pt will benefit from skilled OT to increase their independence and safety with adls and balance to allow discharge home.     Follow Up Recommendations  No OT follow up    Equipment Recommendations  None recommended by OT    Recommendations for Other Services       Precautions / Restrictions Precautions Precaution Comments: watch 02      Mobility Bed Mobility               General bed mobility comments: oob to chair this session  Transfers Overall transfer level: Needs assistance   Transfers: Sit to/from Stand Sit to Stand: Min guard         General transfer comment: requires use of bil hands.     Balance                                           ADL either performed or assessed with clinical judgement   ADL Overall ADL's : Needs assistance/impaired Eating/Feeding: Independent    Grooming: Min guard;Standing Grooming Details (indicate cue type and reason): shaving face in seated position due to oxygen desaturation             Lower Body Dressing: Min guard;Sit to/from stand Lower Body Dressing Details (indicate cue type and reason): doff underwear and don new underwear. educated on crossing bil LE instead of bending for energy conservation Toilet Transfer: Magazine features editor Details (indicate cue type and reason): simulated transfer from chair requires bil UE           General ADL Comments: limited movement due to [place] 10 L      Vision Baseline Vision/History: Wears glasses Wears Glasses: Reading only       Perception     Praxis      Pertinent Vitals/Pain Pain Assessment: No/denies pain     Hand Dominance Right   Extremity/Trunk Assessment Upper Extremity Assessment Upper Extremity Assessment: Overall WFL for tasks assessed   Lower Extremity Assessment Lower Extremity Assessment: Defer to PT evaluation   Cervical / Trunk Assessment Cervical / Trunk Assessment: Normal   Communication Communication Communication: No difficulties   Cognition Arousal/Alertness: Awake/alert Behavior During Therapy: WFL for tasks assessed/performed Overall Cognitive Status: Within Functional Limits for tasks assessed  General Comments  10 L HFNC with decreased saturations 86% with mod cues for pursed lip breathing. PT able to rebound ~1 minute with pursed lip breathing.     Exercises     Shoulder Instructions      Home Living Family/patient expects to be discharged to:: Private residence Living Arrangements: Spouse/significant other Available Help at Discharge: Family;Available 24 hours/day Type of Home: House Home Access: Stairs to enter [employer] of Steps: 6 Entrance Stairs-Rails: Right Home Layout: One level     Bathroom Shower/Tub: Occupational psychologist:  Standard Bathroom Accessibility: Yes   Home Equipment: None   Additional Comments: wife recovering from [place] as well       Prior Functioning/Environment Level of Independence: Independent        Comments: Hydrographic surveyor, drives, works using Biochemist, clinical Problem List: Decreased strength;Decreased activity tolerance;Impaired balance (sitting and/or standing);Decreased knowledge of use of DME or AE;Decreased knowledge of precautions;Cardiopulmonary status limiting activity      OT Treatment/Interventions: Self-care/ADL training;Energy conservation;DME and/or AE instruction;Therapeutic activities;Patient/family education;Balance training    OT Goals(Current goals can be found in the care plan section) Acute Rehab OT Goals Patient Stated Goal: return home with family to assist OT Goal Formulation: With patient Time For Goal Achievement: 08/25/20 Potential to Achieve Goals: Good  OT Frequency: Min 2X/week   Barriers to D/C:            Co-evaluation PT/OT/SLP Co-Evaluation/Treatment: Yes Reason for Co-Treatment: To address functional/ADL transfers   OT goals addressed during session: ADL's and self-care      AM-PAC OT "6 Clicks" Daily Activity     Outcome Measure Help from another person eating meals?: A Little Help from another person taking care of personal grooming?: A Little Help from another person toileting, which includes using toliet, bedpan, or urinal?: A Little Help from another person bathing (including washing, rinsing, drying)?: A Little Help from another person to put on and taking off regular upper body clothing?: A Little Help from another person to put on and taking off regular lower body clothing?: A Little 6 Click Score: 18   End of Session Nurse Communication: Mobility status;Precautions  Activity Tolerance: Patient tolerated treatment well Patient left: in chair;with call bell/phone within reach (eating lunch)  OT  Visit Diagnosis: Unsteadiness on feet (R26.81);Muscle weakness (generalized) (M62.81)                Time: 1300-1336 OT Time Calculation (min): 36 min Charges:  OT General Charges $OT Visit: 1 Visit OT Evaluation $OT Eval Moderate Complexity: 1 Mod   Tim Day, OTR/L  Acute Rehabilitation Services Pager: [phone] Office: [phone] .   Tim Day 08/11/2020, 1:41 PM

## 2020-08-11 NOTE — Evaluation (Signed)
Physical Therapy Evaluation Patient Details Name: Tim Day MRN: [id number] DOB: [birth year] Today's Date: 08/11/2020   History of Present Illness   69 y.o. male  admitted to [facility] due to acute hypoxemic respiratory failure from covid-19 multifocal pneumonia, given IV steroids, baricitinib, and empiric full dose lovenox. 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 Able to perform low level activities such as standing marches for approximately 30 seconds before fatiguing/desatting and having RPE 6/10, also able to ambulate approximately 31f to and from sink with standing rest break (distance limited by HFNC line length). SPO2 drop to as low as 84-86% with activity but able to recover well with rest and PLB. Discussed getting personal pulse ox for home use/monitoring and introduced energy conservation strategies. Left up in recliner with all needs met, RN aware of patient status.     Follow Up Recommendations Home health PT    Equipment Recommendations  None recommended by PT    Recommendations for Other Services       Precautions / Restrictions Precautions Precautions: None Precaution Comments: watch 02 Restrictions Weight Bearing Restrictions: No      Mobility  Bed Mobility Overal bed mobility: Independent             General bed mobility comments: oob to chair this session  Transfers Overall transfer level: Needs assistance Equipment used: None Transfers: Sit to/from Stand Sit to Stand: Min guard         General transfer comment: requires use of bil  hands, increased time to steady  Ambulation/Gait Ambulation/Gait assistance: Min guard Gait Distance (Feet): 4 Feet ([id number] device: None Gait Pattern/deviations: Step-through pattern Gait velocity: decreased   General Gait Details: generally WNL, just fatigues easily and O2 desaturates quickly with activity; gait distance limited due to length of HFNC line  Stairs            Wheelchair Mobility    Modified Rankin (Stroke Patients Only)       Balance Overall balance assessment: No apparent balance deficits (not formally assessed)                                           Pertinent Vitals/Pain Pain Assessment: No/denies pain    Home Living Family/patient expects to be discharged to:: Private residence Living Arrangements: Spouse/significant other Available Help at Discharge: Family;Available 24 hours/day Type of Home: House Home Access: Stairs to enter Entrance Stairs-Rails: Right Entrance Stairs-Number of Steps: 6 Home Layout: One level Home Equipment: None Additional Comments: wife recovering [place] well     Prior Function Level of Independence: Independent         Comments: ComHydrographic surveyorrives, works using bacTree surgeonminance   Dominant Hand: Right    Extremity/Trunk Assessment   Upper Extremity Assessment Upper Extremity Assessment: Defer to OT evaluation    Lower Extremity Assessment Lower Extremity Assessment: Overall WFL for tasks assessed    Cervical / Trunk Assessment Cervical / Trunk Assessment: Normal  Communication   Communication: No difficulties  Cognition Arousal/Alertness: Awake/alert Behavior During Therapy: WFL for tasks assessed/performed Overall Cognitive Status: Within Functional Limits for tasks assessed                                        General Comments General comments (skin integrity, edema, etc.): 10L HFNC with decreased  sats as low as 84-86%, Mod cues for PLB and genrally able to recover to >90% with rest/PLB    Exercises     Assessment/Plan    PT Assessment Patient needs continued PT services  PT Problem List Decreased strength;Decreased activity tolerance;Cardiopulmonary status limiting activity;Decreased mobility;Decreased coordination       PT Treatment Interventions Balance training;DME instruction;Gait training;Stair training;Functional mobility training;Patient/family education;Therapeutic activities;Therapeutic exercise    PT Goals (Current goals can be found in the Care Plan section)  Acute Rehab PT Goals Patient Stated Goal: return home with family to assist PT Goal Formulation: With patient Time For Goal Achievement: 08/25/20 Potential to Achieve Goals: Good    Frequency Min 3X/week   Barriers to discharge        Co-evaluation   Reason for Co-Treatment: To address functional/ADL transfers   OT goals addressed during session: ADL's and self-care       AM-PAC PT "6 Clicks" Mobility  Outcome Measure Help needed turning from your back to your side while in a flat bed without using bedrails?: None Help needed moving from lying on your back to sitting on the side of a flat bed without using bedrails?: None Help needed moving to and from a bed to a chair (including a wheelchair)?: A Little Help needed standing up from a chair using your arms (e.g., wheelchair or bedside chair)?: A Little Help needed to walk in hospital room?: A Little Help needed climbing 3-5 steps with a railing? : A Lot 6 Click Score: 19    End of Session Equipment Utilized During Treatment: Oxygen Activity Tolerance: Patient tolerated treatment well Patient left: in chair;with call bell/phone within reach Nurse Communication: Mobility status PT Visit Diagnosis: Muscle weakness (generalized) (M62.81);Difficulty in walking, not elsewhere classified (R26.2)    Time: 1300-1336 PT Time Calculation (min) (ACUTE  ONLY): 36 min   Charges:   PT Evaluation $PT Eval Low Complexity: 1 Low (co-eval with OT)         Windell Norfolk, DPT, PN1   Supplemental Physical Therapist [place]    Pager [phone] Acute Rehab Office [phone]

## 2020-08-12 LAB — CBC
HCT: 43.4 % (ref 39.0–52.0)
Hemoglobin: 15 g/dL (ref 13.0–17.0)
MCH: 29.2 pg (ref 26.0–34.0)
MCHC: 34.6 g/dL (ref 30.0–36.0)
MCV: 84.4 fL (ref 80.0–100.0)
Platelets: 269 10*3/uL (ref 150–400)
RBC: 5.14 MIL/uL (ref 4.22–5.81)
RDW: 12.9 % (ref 11.5–15.5)
WBC: 11.9 10*3/uL — ABNORMAL HIGH (ref 4.0–10.5)
nRBC: 0 % (ref 0.0–0.2)

## 2020-08-12 LAB — COMPREHENSIVE METABOLIC PANEL
ALT: 43 U/L (ref 0–44)
AST: 28 U/L (ref 15–41)
Albumin: 2.7 g/dL — ABNORMAL LOW (ref 3.5–5.0)
Alkaline Phosphatase: 58 U/L (ref 38–126)
Anion gap: 11 (ref 5–15)
BUN: 31 mg/dL — ABNORMAL HIGH (ref 8–23)
CO2: 22 mmol/L (ref 22–32)
Calcium: 8.5 mg/dL — ABNORMAL LOW (ref 8.9–10.3)
Chloride: 101 mmol/L (ref 98–111)
Creatinine, Ser: 1.03 mg/dL (ref 0.61–1.24)
GFR calc Af Amer: >60 mL/min (ref >60)
GFR calc non Af Amer: >60 mL/min (ref >60)
Glucose, Bld: 140 mg/dL — ABNORMAL HIGH (ref 70–99)
Potassium: 4.8 mmol/L (ref 3.5–5.1)
Sodium: 134 mmol/L — ABNORMAL LOW (ref 135–145)
Total Bilirubin: 1.1 mg/dL (ref 0.3–1.2)
Total Protein: 5.9 g/dL — ABNORMAL LOW (ref 6.5–8.1)

## 2020-08-12 LAB — GLUCOSE, CAPILLARY
Glucose-Capillary: 294 mg/dL — ABNORMAL HIGH (ref 70–99)
Glucose-Capillary: 244 mg/dL — ABNORMAL HIGH (ref 70–99)
Glucose-Capillary: 322 mg/dL — ABNORMAL HIGH (ref 70–99)
Glucose-Capillary: 395 mg/dL — ABNORMAL HIGH (ref 70–99)

## 2020-08-12 LAB — C-REACTIVE PROTEIN: CRP: 0.8 mg/dL (ref <1.0)

## 2020-08-12 NOTE — Progress Notes (Signed)
PROGRESS NOTE  Tim Day  MPN:[id number] DOB: 1951/04/18 DOA: 08/08/2020 PCP: Celene Squibb, MD   Brief Narrative: Tim Day is a 68 y.o. male with a history of T2DM, HTN, HLD, GERD, and recent hospitalization for covid-19 pneumonia (discharged 9/3 on 6LPM O2; completed remdesivir infusions as outpatient 9/5) who reported worsening shortness of breath despite taking decadron as directed. In the ED 9/7, he was hypoxemic requiring 15L NRB with tachypnea, lactic acid elevation, CRP 2.2, and d-dimer >20. CTA chest showed no evidence of PE but demonstrated widespread airspace opacities and mild pneumomediastinum. He was readmitted for acute hypoxemic respiratory failure due to covid-19 multifocal pneumonia, given IV steroids, baricitinib, and empiric full dose lovenox. Subsequent LE venous U/S confirmed presence of acute bilateral DVTs.   Assessment & Plan: Principal Problem:   Acute hypoxemic respiratory failure due to COVID-19 [facility]) Active Problems:   DM2 (diabetes mellitus, type 2) (HCC)   Benign essential HTN   Dyslipidemia   Esophageal reflux   Sepsis ([place])   Hyponatremia  Acute hypoxemic respiratory failure secondary to covid-19 pneumonia: SARS-CoV-2 PCR positive on 9/1 s/p remdesivir 9/1 - 9/5, not vaccinated -Continue steroids, consider taper given patient's prolonged hospital course and readmission if clinically improving drastically  -Able to wean salter nasal cannula down this morning -sats remained well within normal limits  - Continue baricitinib, started 9/7, for 14 days or until hospital discharge. Monitor Cr, LFTs, differential. Will be on full dose anticoagulation for the duration of therapy.  - Encourage OOB, IS, FV, and awake proning if able - Encouraged to get vaccine after isolation period ends (9/23) Recent Labs    08/10/20 0607 08/11/20 0256 08/12/20 0220  DDIMER 10.56*  --   --   CRP 4.6* 1.7* 0.8    Acute bilateral DVTs in the right  peroneal veins and left gastrocnemius veins: Likely provoked by covid infection and decreased ambulation - Therapeutic lovenox 1mg /kg q12h. CTA chest without PE. - Likely transition to p.o. Xarelto versus Eliquis pending insurance approval and pricing in the next 48 hours  Epistaxis, improving:  - Nasal saline, time-limited afrin, hopefully will continue to improve as we wean oxygen down.   Pneumomediastinum:  - Supportive care, not severe, though certainly a marker of severity of pulmonary injury. Suggests a protracted recovery period.  Uncontrolled T2DM with steroid-induced hyperglycemia: HbA1c 7.2%.  -Continue levemir to 25u daily, continue 6u TIDWC and resistant SSI and linagliptin.  -Continue to monitor closely, likely able to titrate down as steroids wean off  GERD:  - Continue PPI  HTN:  - Continue home medications  DVT prophylaxis: Lovenox 1mg /kg q12h Code Status: Full, confirmed Family Communication: Wife daily Disposition Plan:  Status is: Inpatient  Remains inpatient appropriate because:Inpatient level of care appropriate due to severity of illness  Need for supplemental oxygen well above baseline and ongoing anticoagulation with notably acute onset DVTs and pneumomediastinum.  Dispo: The patient is from: Home              Anticipated d/c is to: Home              Anticipated d/c date is: > 3 days              Patient currently is not medically stable to d/c.  Consultants:   None  Procedures:   None  Antimicrobials:  None   Subjective: Continues to have moderate-severe shortness of breath with any exertion that is constant and improves slowly with rest.  Patient was to some pleuritic chest pain anteriorly with deep breathing and incentive spirometry but otherwise denies nausea, vomiting, diarrhea, constipation, headache, fevers, chills.  Objective: Vitals:   08/11/20 1657 08/11/20 2115 08/12/20 0103 08/12/20 0349  BP: 104/65 108/86 115/77 114/73  Pulse:  84 86 81 73  Resp: 20 20 20 20   Temp: (!) 97.4 F (36.3 C) 97.6 F (36.4 C) 98 F (36.7 C) 97.9 F (36.6 C)  TempSrc: Oral Oral Oral Oral  SpO2: 97% 99% 95% 96%  Weight:      Height:        Intake/Output Summary (Last 24 hours) at 08/12/2020 0729 Last data filed at 08/12/2020 0617 Gross per 24 hour  Intake 480 ml  Output 1800 ml  Net -1320 ml   Filed Weights   08/08/20 1139 08/09/20 2117  Weight: 90.7 kg 84.8 kg   General:  Pleasantly resting in bed, No acute distress. HEENT:  Normocephalic atraumatic.  Sclerae nonicteric, noninjected.  Extraocular movements intact bilaterally. Neck:  Without mass or deformity.  Trachea is midline. Lungs:  Clear to auscultate bilaterally without rhonchi, wheeze, or rales. Heart:  Regular rate and rhythm.  Without murmurs, rubs, or gallops. Abdomen:  Soft, nontender, nondistended.  Without guarding or rebound. Extremities: Without cyanosis, clubbing, edema, or obvious deformity. Vascular:  Dorsalis pedis and posterior tibial pulses palpable bilaterally. Skin:  Warm and dry, no erythema, no ulcerations.   Data Reviewed: I have personally reviewed following labs and the CTA of the chest.  CBC: Recent Labs  Lab 08/08/20 1616 08/09/20 0351 08/10/20 0607 08/11/20 0256 08/12/20 0220  WBC 12.0* 6.3 8.2 9.4 11.9*  NEUTROABS  --  5.4 6.8 8.0*  --   HGB 16.0 14.3 14.5 14.6 15.0  HCT 47.0 41.2 41.5 41.3 43.4  MCV 84.7 84.1 84.5 83.4 84.4  PLT 229 211 227 243 315   Basic Metabolic Panel: Recent Labs  Lab 08/08/20 1144 08/08/20 1144 08/08/20 1616 08/09/20 0351 08/10/20 0607 08/11/20 0256 08/12/20 0220  NA 132*  --   --  132* 134* 132* 134*  K 4.4  --   --  4.6 4.0 4.4 4.8  CL 102  --   --  102 101 102 101  CO2 17*  --   --  19* 23 20* 22  GLUCOSE 358*  --   --  305* 254* 208* 140*  BUN 36*  --   --  30* 36* 35* 31*  CREATININE 1.00   < > 0.96 0.86 1.13 0.98 1.03  CALCIUM 8.9  --   --  8.3* 8.2* 8.2* 8.5*  MG  --   --   --  2.2   --   --   --   PHOS  --   --   --  3.9  --   --   --    < > = values in this interval not displayed.   GFR: Estimated Creatinine Clearance: 71.9 mL/min (by C-G formula based on SCr of 1.03 mg/dL). Liver Function Tests: Recent Labs  Lab 08/09/20 0351 08/10/20 0607 08/11/20 0256 08/12/20 0220  AST 23 28 29 28   ALT 36 41 43 43  ALKPHOS 55 60 55 58  BILITOT 0.9 1.0 0.8 1.1  PROT 6.1* 5.6* 5.6* 5.9*  ALBUMIN 2.7* 2.6* 2.6* 2.7*   HbA1C: No results for input(s): HGBA1C in the last 72 hours. CBG: Recent Labs  Lab 08/11/20 0636 08/11/20 1259 08/11/20 1651 08/11/20 2120 08/12/20 0616  GLUCAP 257* 319*  308* 337* 294*   Lipid Profile: No results for input(s): CHOL, HDL, LDLCALC, TRIG, CHOLHDL, LDLDIRECT in the last 72 hours. Thyroid Function Tests: No results for input(s): TSH, T4TOTAL, FREET4, T3FREE, THYROIDAB in the last 72 hours. Anemia Panel: No results for input(s): VITAMINB12, FOLATE, FERRITIN, TIBC, IRON, RETICCTPCT in the last 72 hours. Urine analysis:    Component Value Date/Time   COLORURINE YELLOW 01/21/2015 1822   APPEARANCEUR CLEAR 01/21/2015 1822   LABSPEC 1.021 01/21/2015 1822   PHURINE 5.5 01/21/2015 1822   GLUCOSEU NEGATIVE 01/21/2015 1822   HGBUR NEGATIVE 01/21/2015 1822   BILIRUBINUR NEGATIVE 01/21/2015 1822   KETONESUR NEGATIVE 01/21/2015 1822   PROTEINUR NEGATIVE 01/21/2015 1822   UROBILINOGEN 0.2 01/21/2015 1822   NITRITE NEGATIVE 01/21/2015 1822   LEUKOCYTESUR NEGATIVE 01/21/2015 1822   Recent Results (from the past 240 hour(s))  SARS Coronavirus 2 by RT PCR (hospital order, performed in [place] hospital lab) Nasopharyngeal Nasopharyngeal Swab     Status: Abnormal   Collection Time: 08/02/20  8:48 PM   Specimen: Nasopharyngeal Swab  Result Value Ref Range Status   SARS Coronavirus 2 POSITIVE (A) NEGATIVE Final    Comment: CRITICAL RESULT CALLED TO, READ BACK BY AND VERIFIED WITH: GIBSON,RN AT 2210 ON 9.1.21 BY ISLEY,B (NOTE) SARS-CoV-2  target nucleic acids are DETECTED  SARS-CoV-2 RNA is generally detectable in upper respiratory specimens  during the acute phase of infection.  Positive results are indicative  of the presence of the identified virus, but do not rule out bacterial infection or co-infection with other pathogens not detected by the test.  Clinical correlation with patient history and  other diagnostic information is necessary to determine patient infection status.  The expected result is negative.  Fact Sheet for Patients:   StrictlyIdeas.no   Fact Sheet for Healthcare Providers:   BankingDealers.co.za    This test is not yet approved or cleared by the [place] FDA and  has been authorized for detection and/or diagnosis of SARS-CoV-2 by FDA under an Emergency Use Authorization (EUA).  This EUA will remain in effect (me aning this test can be used) for the duration of  the COVID-19 declaration under Section 564(b)(1) of the Act, 21 U.S.C. section 360-bbb-3(b)(1), unless the authorization is terminated or revoked sooner.  Performed at [facility], [address], [place], [place] [address]   Blood Culture (routine x 2)     Status: None   Collection Time: 08/02/20 10:06 PM   Specimen: BLOOD  Result Value Ref Range Status   Specimen Description BLOOD RIGHT ANTECUBITAL  Final   Special Requests   Final    BOTTLES DRAWN AEROBIC AND ANAEROBIC Blood Culture adequate volume   Culture   Final    NO GROWTH 5 DAYS Performed at [facility], [address] [place], [place] [address]    Report Status 08/07/2020 FINAL  Final  Blood Culture (routine x 2)     Status: None   Collection Time: 08/02/20 10:24 PM   Specimen: BLOOD  Result Value Ref Range Status   Specimen Description BLOOD RIGHT ANTECUBITAL  Final   Special Requests   Final    BOTTLES DRAWN AEROBIC AND ANAEROBIC Blood Culture adequate volume   Culture   Final    NO GROWTH 5 DAYS Performed at  [facility], [address], [place], [place] [address]    Report Status 08/08/2020 FINAL  Final  Blood Culture (routine x 2)     Status: None (Preliminary result)  Collection Time: 08/08/20  1:56 PM   Specimen: BLOOD  Result Value Ref Range Status   Specimen Description BLOOD RIGHT ANTECUBITAL  Final   Special Requests   Final    BOTTLES DRAWN AEROBIC AND ANAEROBIC Blood Culture adequate volume   Culture   Final    NO GROWTH 3 DAYS Performed at [facility], [address], [place], [place] [address]    Report Status PENDING  Incomplete  Blood Culture (routine x 2)     Status: None (Preliminary result)   Collection Time: 08/08/20  1:56 PM   Specimen: BLOOD  Result Value Ref Range Status   Specimen Description BLOOD LEFT ANTECUBITAL  Final   Special Requests   Final    BOTTLES DRAWN AEROBIC ONLY Blood Culture results may not be optimal due to an inadequate volume of blood received in culture bottles   Culture   Final    NO GROWTH 3 DAYS Performed at [facility], [place] [address], [place], [place] [address]    Report Status PENDING  Incomplete      Radiology Studies: No results found.  Scheduled Meds: . amLODipine  10 mg Oral Daily  . vitamin C  500 mg Oral Daily  . baricitinib  4 mg Oral Daily  . enoxaparin (LOVENOX) injection  1 mg/kg Subcutaneous Q12H  . hydrochlorothiazide  25 mg Oral Daily  . insulin aspart  0-20 Units Subcutaneous TID WC  . insulin aspart  0-5 Units Subcutaneous QHS  . insulin aspart  6 Units Subcutaneous TID WC  . insulin detemir  25 Units Subcutaneous Daily  . irbesartan  300 mg Oral Daily  . linagliptin  5 mg Oral Daily  . methylPREDNISolone (SOLU-MEDROL) injection  60 mg Intravenous Q12H  . oxymetazoline  1 spray Each Nare BID  . pantoprazole  40 mg Oral Daily  . zinc sulfate  220 mg Oral Daily   Continuous Infusions:    LOS: 4 days   Time spent: 35 minutes.  Little Ishikawa, DO [facility] [email or web address] 08/12/2020, 7:29 AM

## 2020-08-13 LAB — COMPREHENSIVE METABOLIC PANEL
ALT: 42 U/L (ref 0–44)
AST: 30 U/L (ref 15–41)
Albumin: 2.8 g/dL — ABNORMAL LOW (ref 3.5–5.0)
Alkaline Phosphatase: 65 U/L (ref 38–126)
Anion gap: 13 (ref 5–15)
BUN: 31 mg/dL — ABNORMAL HIGH (ref 8–23)
CO2: 19 mmol/L — ABNORMAL LOW (ref 22–32)
Calcium: 8.3 mg/dL — ABNORMAL LOW (ref 8.9–10.3)
Chloride: 100 mmol/L (ref 98–111)
Creatinine, Ser: 0.97 mg/dL (ref 0.61–1.24)
GFR calc Af Amer: >60 mL/min (ref >60)
GFR calc non Af Amer: >60 mL/min (ref >60)
Glucose, Bld: 88 mg/dL (ref 70–99)
Potassium: 4.9 mmol/L (ref 3.5–5.1)
Sodium: 132 mmol/L — ABNORMAL LOW (ref 135–145)
Total Bilirubin: 1.5 mg/dL — ABNORMAL HIGH (ref 0.3–1.2)
Total Protein: 5.7 g/dL — ABNORMAL LOW (ref 6.5–8.1)

## 2020-08-13 LAB — GLUCOSE, CAPILLARY
Glucose-Capillary: 112 mg/dL — ABNORMAL HIGH (ref 70–99)
Glucose-Capillary: 291 mg/dL — ABNORMAL HIGH (ref 70–99)
Glucose-Capillary: 335 mg/dL — ABNORMAL HIGH (ref 70–99)
Glucose-Capillary: 239 mg/dL — ABNORMAL HIGH (ref 70–99)

## 2020-08-13 LAB — CULTURE, BLOOD (ROUTINE X 2)
Culture: NO GROWTH
Culture: NO GROWTH
Special Requests: ADEQUATE

## 2020-08-13 LAB — C-REACTIVE PROTEIN: CRP: 1.6 mg/dL — ABNORMAL HIGH (ref <1.0)

## 2020-08-13 MED ORDER — PREDNISONE 20 MG PO TABS
20.0000 mg | ORAL_TABLET | Freq: Two times a day (BID) | ORAL | Status: DC
  Administered 2020-08-13 – 2020-08-14 (×4): 20 mg via ORAL
  Filled 2020-08-13 (×4): qty 1

## 2020-08-13 NOTE — Progress Notes (Signed)
PROGRESS NOTE  Tim Day  PXT:[id number] DOB: [birth year] DOA: 08/08/2020 PCP: Celene Squibb, MD   Brief Narrative: Tim Day is a 70 y.o. male with a history of T2DM, HTN, HLD, GERD, and recent hospitalization for covid-19 pneumonia (discharged 9/3 on 6LPM O2; completed remdesivir infusions as outpatient 9/5) who reported worsening shortness of breath despite taking decadron as directed. In the ED 9/7, he was hypoxemic requiring 15L NRB with tachypnea, lactic acid elevation, CRP 2.2, and d-dimer >20. CTA chest showed no evidence of PE but demonstrated widespread airspace opacities and mild pneumomediastinum. He was readmitted for acute hypoxemic respiratory failure due to covid-19 multifocal pneumonia, given IV steroids, baricitinib, and empiric full dose lovenox. Subsequent LE venous U/S confirmed presence of acute bilateral DVTs.   Assessment & Plan: Principal Problem:   Acute hypoxemic respiratory failure due to COVID-19 [facility]) Active Problems:   DM2 (diabetes mellitus, type 2) (HCC)   Benign essential HTN   Dyslipidemia   Esophageal reflux   Sepsis ([place])   Hyponatremia   Acute hypoxemic respiratory failure secondary to covid-19 pneumonia: SARS-CoV-2 PCR positive on 9/1 s/p remdesivir 9/1 - 9/5, not vaccinated -Continue to wean steroids, currently prednisone 20 twice daily -Able to wean salter nasal cannula down this morning -sats remained well within normal limits -we will continue to wean with ambulation as well - Continue baricitinib, started 9/7, for 14 days or until hospital discharge. Monitor Cr, LFTs, differential. Will be on full dose anticoagulation for the duration of therapy.  - Encourage OOB, IS, FV, and awake proning if able - Encouraged to get vaccine after isolation period ends (9/23) Recent Labs    08/11/20 0256 08/12/20 0220 08/13/20 0408  CRP 1.7* 0.8 1.6*    Acute bilateral DVTs in the right peroneal veins and left gastrocnemius veins:  Likely provoked by covid infection and decreased ambulation - Therapeutic lovenox 1mg /kg q12h. CTA chest without PE. -Transition to Eliquis -will require high dose through 08/16/2020 then transition to 5 mg twice daily  Epistaxis, resolved:  -Continue to monitor closely given initiation of Eliquis  -Continue nasal saline, time-limited afrin, resolving with decreased oxygen support  Pneumomediastinum:  - Supportive care, not severe, though certainly a marker of severity of pulmonary injury. Suggests a protracted recovery period.  Uncontrolled T2DM with steroid-induced hyperglycemia: HbA1c 7.2%.  -Continue levemir to 25u daily, continue 6u TIDWC and resistant SSI and linagliptin.  -Continue to monitor closely, likely able to titrate down as steroids wean off  GERD:  - Continue PPI  HTN:  - Continue home medications  DVT prophylaxis: Eliquis Code Status: Full, confirmed Family Communication: Wife daily Disposition Plan:  Status is: Inpatient  Remains inpatient appropriate because:Inpatient level of care appropriate due to severity of illness  Need for supplemental oxygen well above baseline and ongoing anticoagulation with notably acute onset DVTs and pneumomediastinum.  Dispo: The patient is from: Home              Anticipated d/c is to: Home              Anticipated d/c date is: > 3 days              Patient currently is not medically stable to d/c.  Consultants:   None  Procedures:   None  Antimicrobials:  None   Subjective: Continues to have somewhat moderate dyspnea with even minimal exertion, denies any acute issues or events overnight but reports being severely dyspneic this morning just moving from  bed to bedside chair only requiring 3-4 steps.  Objective: Vitals:   08/12/20 1552 08/12/20 2100 08/13/20 0053 08/13/20 0552  BP: (!) 104/57 126/68 136/77 123/63  Pulse:  92 85 95  Resp:  (!) 25 (!) 21 (!) 24  Temp: (!) 97.4 F (36.3 C) 98.4 F (36.9 C) 97.7 F  (36.5 C) 97.9 F (36.6 C)  TempSrc: Oral Oral Oral Oral  SpO2: 95% 99% 96% 94%  Weight:      Height:        Intake/Output Summary (Last 24 hours) at 08/13/2020 0718 Last data filed at 08/12/2020 2100 Gross per 24 hour  Intake --  Output 475 ml  Net -475 ml   Filed Weights   08/08/20 1139 08/09/20 2117  Weight: 90.7 kg 84.8 kg   General:  Pleasantly resting in bed, No acute distress. HEENT:  Normocephalic atraumatic.  Sclerae nonicteric, noninjected.  Extraocular movements intact bilaterally. Neck:  Without mass or deformity.  Trachea is midline. Lungs: Diffuse rhonchi bilaterally without overt wheeze or rales. Heart:  Regular rate and rhythm.  Without murmurs, rubs, or gallops. Abdomen:  Soft, nontender, nondistended.  Without guarding or rebound. Extremities: Without cyanosis, clubbing, edema, or obvious deformity. Vascular:  Dorsalis pedis and posterior tibial pulses palpable bilaterally. Skin:  Warm and dry, no erythema, no ulcerations.   Data Reviewed: I have personally reviewed following labs and imaging.  CBC: Recent Labs  Lab 08/08/20 1616 08/09/20 0351 08/10/20 0607 08/11/20 0256 08/12/20 0220  WBC 12.0* 6.3 8.2 9.4 11.9*  NEUTROABS  --  5.4 6.8 8.0*  --   HGB 16.0 14.3 14.5 14.6 15.0  HCT 47.0 41.2 41.5 41.3 43.4  MCV 84.7 84.1 84.5 83.4 84.4  PLT 229 211 227 243 604   Basic Metabolic Panel: Recent Labs  Lab 08/09/20 0351 08/10/20 0607 08/11/20 0256 08/12/20 0220 08/13/20 0408  NA 132* 134* 132* 134* 132*  K 4.6 4.0 4.4 4.8 4.9  CL 102 101 102 101 100  CO2 19* 23 20* 22 19*  GLUCOSE 305* 254* 208* 140* 88  BUN 30* 36* 35* 31* 31*  CREATININE 0.86 1.13 0.98 1.03 0.97  CALCIUM 8.3* 8.2* 8.2* 8.5* 8.3*  MG 2.2  --   --   --   --   PHOS 3.9  --   --   --   --    GFR: Estimated Creatinine Clearance: 76.3 mL/min (by C-G formula based on SCr of 0.97 mg/dL). Liver Function Tests: Recent Labs  Lab 08/09/20 0351 08/10/20 0607 08/11/20 0256  08/12/20 0220 08/13/20 0408  AST 23 28 29 28 30   ALT 36 41 43 43 42  ALKPHOS 55 60 55 58 65  BILITOT 0.9 1.0 0.8 1.1 1.5*  PROT 6.1* 5.6* 5.6* 5.9* 5.7*  ALBUMIN 2.7* 2.6* 2.6* 2.7* 2.8*   HbA1C: No results for input(s): HGBA1C in the last 72 hours. CBG: Recent Labs  Lab 08/12/20 0616 08/12/20 1152 08/12/20 1634 08/12/20 2102 08/13/20 0625  GLUCAP 294* 244* 322* 395* 112*   Lipid Profile: No results for input(s): CHOL, HDL, LDLCALC, TRIG, CHOLHDL, LDLDIRECT in the last 72 hours. Thyroid Function Tests: No results for input(s): TSH, T4TOTAL, FREET4, T3FREE, THYROIDAB in the last 72 hours. Anemia Panel: No results for input(s): VITAMINB12, FOLATE, FERRITIN, TIBC, IRON, RETICCTPCT in the last 72 hours. Urine analysis:    Component Value Date/Time   COLORURINE YELLOW 01/21/2015 1822   APPEARANCEUR CLEAR 01/21/2015 1822   LABSPEC 1.021 01/21/2015 1822   PHURINE  5.5 01/21/2015 1822   GLUCOSEU NEGATIVE 01/21/2015 1822   HGBUR NEGATIVE 01/21/2015 1822   BILIRUBINUR NEGATIVE 01/21/2015 1822   KETONESUR NEGATIVE 01/21/2015 1822   PROTEINUR NEGATIVE 01/21/2015 1822   UROBILINOGEN 0.2 01/21/2015 1822   NITRITE NEGATIVE 01/21/2015 1822   LEUKOCYTESUR NEGATIVE 01/21/2015 1822   Recent Results (from the past 240 hour(s))  Blood Culture (routine x 2)     Status: None (Preliminary result)   Collection Time: 08/08/20  1:56 PM   Specimen: BLOOD  Result Value Ref Range Status   Specimen Description BLOOD RIGHT ANTECUBITAL  Final   Special Requests   Final    BOTTLES DRAWN AEROBIC AND ANAEROBIC Blood Culture adequate volume   Culture   Final    NO GROWTH 4 DAYS Performed at [facility], [place] [address], [place], [place] [address]    Report Status PENDING  Incomplete  Blood Culture (routine x 2)     Status: None (Preliminary result)   Collection Time: 08/08/20  1:56 PM   Specimen: BLOOD  Result Value Ref Range Status   Specimen Description BLOOD LEFT ANTECUBITAL  Final    Special Requests   Final    BOTTLES DRAWN AEROBIC ONLY Blood Culture results may not be optimal due to an inadequate volume of blood received in culture bottles   Culture   Final    NO GROWTH 4 DAYS Performed at [facility], [place] [address] [place], [place] [address]    Report Status PENDING  Incomplete      Radiology Studies: No results found.  Scheduled Meds: . amLODipine  10 mg Oral Daily  . vitamin C  500 mg Oral Daily  . baricitinib  4 mg Oral Daily  . enoxaparin (LOVENOX) injection  1 mg/kg Subcutaneous Q12H  . hydrochlorothiazide  25 mg Oral Daily  . insulin aspart  0-20 Units Subcutaneous TID WC  . insulin aspart  0-5 Units Subcutaneous QHS  . insulin aspart  6 Units Subcutaneous TID WC  . insulin detemir  25 Units Subcutaneous Daily  . irbesartan  300 mg Oral Daily  . linagliptin  5 mg Oral Daily  . oxymetazoline  1 spray Each Nare BID  . pantoprazole  40 mg Oral Daily  . predniSONE  20 mg Oral BID WC  . zinc sulfate  220 mg Oral Daily   Continuous Infusions:    LOS: 5 days   Time spent: 35 minutes.  Little Ishikawa, DO [facility] [email or web address] 08/13/2020, 7:18 AM

## 2020-08-14 LAB — COMPREHENSIVE METABOLIC PANEL
ALT: 43 U/L (ref 0–44)
AST: 27 U/L (ref 15–41)
Albumin: 2.6 g/dL — ABNORMAL LOW (ref 3.5–5.0)
Alkaline Phosphatase: 63 U/L (ref 38–126)
Anion gap: 12 (ref 5–15)
BUN: 30 mg/dL — ABNORMAL HIGH (ref 8–23)
CO2: 19 mmol/L — ABNORMAL LOW (ref 22–32)
Calcium: 8.3 mg/dL — ABNORMAL LOW (ref 8.9–10.3)
Chloride: 100 mmol/L (ref 98–111)
Creatinine, Ser: 0.97 mg/dL (ref 0.61–1.24)
GFR calc Af Amer: >60 mL/min (ref >60)
GFR calc non Af Amer: >60 mL/min (ref >60)
Glucose, Bld: 240 mg/dL — ABNORMAL HIGH (ref 70–99)
Potassium: 4.4 mmol/L (ref 3.5–5.1)
Sodium: 131 mmol/L — ABNORMAL LOW (ref 135–145)
Total Bilirubin: 1.2 mg/dL (ref 0.3–1.2)
Total Protein: 6.1 g/dL — ABNORMAL LOW (ref 6.5–8.1)

## 2020-08-14 LAB — CBC
HCT: 47.4 % (ref 39.0–52.0)
Hemoglobin: 16.4 g/dL (ref 13.0–17.0)
MCH: 30 pg (ref 26.0–34.0)
MCHC: 34.6 g/dL (ref 30.0–36.0)
MCV: 86.8 fL (ref 80.0–100.0)
Platelets: 283 10*3/uL (ref 150–400)
RBC: 5.46 MIL/uL (ref 4.22–5.81)
RDW: 13.3 % (ref 11.5–15.5)
WBC: 12.7 10*3/uL — ABNORMAL HIGH (ref 4.0–10.5)
nRBC: 0 % (ref 0.0–0.2)

## 2020-08-14 LAB — GLUCOSE, CAPILLARY
Glucose-Capillary: 209 mg/dL — ABNORMAL HIGH (ref 70–99)
Glucose-Capillary: 227 mg/dL — ABNORMAL HIGH (ref 70–99)
Glucose-Capillary: 262 mg/dL — ABNORMAL HIGH (ref 70–99)

## 2020-08-14 MED ORDER — APIXABAN 5 MG PO TABS: 10.0000 mg | ORAL_TABLET | Freq: Two times a day (BID) | ORAL | Status: DC

## 2020-08-14 MED ORDER — PREDNISONE 10 MG PO TABS: ORAL_TABLET | ORAL | 0 refills | Status: DC

## 2020-08-14 MED ORDER — INSULIN GLARGINE (1 UNIT DIAL) 300 UNIT/ML ~~LOC~~ SOPN: 25.0000 [IU] | PEN_INJECTOR | Freq: Every morning | SUBCUTANEOUS | 0 refills | Status: DC

## 2020-08-14 MED ORDER — BLOOD GLUCOSE MONITOR KIT: PACK | 0 refills | Status: DC

## 2020-08-14 MED ORDER — APIXABAN (ELIQUIS) EDUCATION KIT FOR DVT/PE PATIENTS
PACK | Freq: Once | Status: AC
  Filled 2020-08-14: qty 1

## 2020-08-14 MED ORDER — APIXABAN 5 MG PO TABS: 10.0000 mg | ORAL_TABLET | Freq: Two times a day (BID) | ORAL | 0 refills | Status: DC

## 2020-08-14 MED ORDER — APIXABAN 5 MG PO TABS: 5.0000 mg | ORAL_TABLET | Freq: Two times a day (BID) | ORAL | Status: DC

## 2020-08-14 MED ORDER — INSULIN DETEMIR 100 UNIT/ML ~~LOC~~ SOLN: 25.0000 [IU] | Freq: Every day | SUBCUTANEOUS | 11 refills | Status: DC

## 2020-08-14 MED ORDER — PEN NEEDLES 32G X 4 MM MISC: 1.0000 | Freq: Three times a day (TID) | 0 refills | Status: DC

## 2020-08-14 MED ORDER — APIXABAN 5 MG PO TABS
5.0000 mg | ORAL_TABLET | Freq: Two times a day (BID) | ORAL | 0 refills | Status: DC
Start: 2020-08-16 — End: 2021-03-13

## 2020-08-14 NOTE — Discharge Instructions (Signed)
Information on my medicine - ELIQUIS (apixaban)  Why was Eliquis prescribed for you? Eliquis was prescribed to treat blood clots that may have been found in the veins of your legs (deep vein thrombosis) or in your lungs (pulmonary embolism) and to reduce the risk of them occurring again.  What do You need to know about Eliquis ? The starting dose is 10 mg (two 5 mg tablets) taken TWICE daily for 4 days, then on 08/16/20 EVENING  the dose is reduced to ONE 5 mg tablet taken TWICE daily.  Eliquis may be taken with or without food.   Try to take the dose about the same time in the morning and in the evening. If you have difficulty swallowing the tablet whole please discuss with your pharmacist how to take the medication safely.  Take Eliquis exactly as prescribed and DO NOT stop taking Eliquis without talking to the doctor who prescribed the medication.  Stopping may increase your risk of developing a new blood clot.  Refill your prescription before you run out.  After discharge, you should have regular check-up appointments with your healthcare provider that is prescribing your Eliquis.    What do you do if you miss a dose? If a dose of ELIQUIS is not taken at the scheduled time, take it as soon as possible on the same day and twice-daily administration should be resumed. The dose should not be doubled to make up for a missed dose.  Important Safety Information A possible side effect of Eliquis is bleeding. You should call your healthcare provider right away if you experience any of the following: ? Bleeding from an injury or your nose that does not stop. ? Unusual colored urine (red or dark brown) or unusual colored stools (red or black). ? Unusual bruising for unknown reasons. ? A serious fall or if you hit your head (even if there is no bleeding).  Some medicines may interact with Eliquis and might increase your risk of bleeding or clotting while on Eliquis. To help avoid this,  consult your healthcare provider or pharmacist prior to using any new prescription or non-prescription medications, including herbals, vitamins, non-steroidal anti-inflammatory drugs (NSAIDs) and supplements.  This website has more information on Eliquis (apixaban): http://www.eliquis.com/eliquis/home

## 2020-08-14 NOTE — Progress Notes (Signed)
Inpatient Diabetes Program Recommendations  AACE/ADA: New Consensus Statement on Inpatient Glycemic Control (2015)  Target Ranges:  Prepandial:   less than 140 mg/dL      Peak postprandial:   less than 180 mg/dL (1-2 hours)      Critically ill patients:  140 - 180 mg/dL   Lab Results  Component Value Date   GLUCAP 209 (H) 08/14/2020   HGBA1C 7.2 (H) 08/08/2020    Review of Glycemic Control Results for NEHEMIAS, SAUCEDA (MRN [id number]) as of 08/14/2020 10:17  Ref. Range 08/13/2020 15:56 08/13/2020 20:57 08/14/2020 05:38  Glucose-Capillary Latest Ref Range: 70 - 99 mg/dL 335 (H) 239 (H) 209 (H)   Diabetes history: DM 2 Outpatient Diabetes medications: Trulicity 1.5 mg Weekly, Glipizide 5 mg bid, metformin 1000 mg bid Current orders for Inpatient glycemic control:  Levemir 25 units Daily Novolog 0-20 units tid + hs Novolog 6 units tid meal coverage Tradjenta 5 mg Daily Prednisone 20 mg BID (started QHS on 9/12)  Inpatient Diabetes Program Recommendations:    Consider slightly increasing Levemir to 30 units QD.   Thanks, Bronson Curb, MSN, RNC-OB Diabetes Coordinator [phone] (8a-5p)

## 2020-08-14 NOTE — Progress Notes (Signed)
Physical Therapy Treatment Patient Details Name: Tim Day MRN: [id number] DOB: 1951-08-09 Today's Date: 08/14/2020    History of Present Illness  69 y.o. male  admitted to [facility] due to acute hypoxemic respiratory failure from covid-19 multifocal pneumonia, given IV steroids, baricitinib, and empiric full dose lovenox. 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 Pt continues to desat with activity with recovery within 1-2 minutes. Pt now on decreased supplemental oxygen than with previous session, on 3 lpm [place] and was able to stay at that level with session. Acute PT to continue during pt's hospital stay.    Follow Up Recommendations  Home health PT     Precautions / Restrictions Precautions Precautions: None Precaution Comments: watch 02 Restrictions Weight Bearing Restrictions: No    Mobility  Bed Mobility               General bed mobility comments: pt in chair on arrival and at end of session  Transfers Overall transfer level: Needs assistance Equipment used: Rolling walker (2 wheeled) Transfers: Sit to/from Stand Sit to Stand: Supervision         General transfer comment: use of bil hands on chair arms. RW used for stability in standing.  Ambulation/Gait Ambulation/Gait assistance: Min guard Gait Distance (Feet): 5 Feet (5 feet fwd, then bwd with RW) Assistive device: Rolling walker (2 wheeled) Gait Pattern/deviations: Step-through pattern Gait velocity: decreased   General Gait Details: limited in distance due to lines/leads and increased work of  breathing. SaO2 decrease to 83% on 3 lpm , increased to >/= 90% after 1-2 minutes supported sitting in recliner with emphasis on PLB.       Cognition Arousal/Alertness: Awake/alert Behavior During Therapy: WFL for tasks assessed/performed Overall Cognitive Status: Within Functional Limits for tasks assessed              Exercises General Exercises - Lower Extremity Long Arc Quad: AROM;Strengthening;Both;10 reps;Seated Hip Flexion/Marching: AROM;Strengthening;Both;10 reps;Standing Toe Raises: AROM;Strengthening;Both;10 reps;Seated Heel Raises: AROM;Strengthening;Both;10 reps;Seated     Pertinent Vitals/Pain Pain Assessment: No/denies pain     PT Goals (current goals can now be found in the care plan section) Acute Rehab PT Goals Patient Stated Goal: return home with family to assist PT Goal Formulation: With patient Time For Goal Achievement: 08/25/20 Potential to Achieve Goals: Good Progress towards PT goals: Progressing toward goals    Frequency    Min 3X/week      PT Plan Current plan remains appropriate    AM-PAC PT "6 Clicks" Mobility   Outcome Measure  Help needed turning from your back to your side while in a flat bed without using bedrails?: None Help needed moving from lying on your back to sitting on the side of a flat bed without using bedrails?: None Help needed moving to and from a bed to a chair (including a wheelchair)?: A Little   Help needed to walk in hospital room?: A Little Help needed climbing 3-5 steps with a railing? : A Lot 6 Click Score: 16    End of Session Equipment Utilized During Treatment: Oxygen Activity Tolerance: Patient tolerated treatment well;Patient limited by fatigue Patient left: in chair;with call bell/phone within  reach Nurse Communication: Mobility status PT Visit Diagnosis: Muscle weakness (generalized) (M62.81);Difficulty in walking, not elsewhere classified (R26.2)     Time: 6384-5364 PT Time Calculation  (min) (ACUTE ONLY): 18 min  Charges:  $Therapeutic Exercise: 8-22 mins                    Willow Ora, PTA, [facility] [address] [place], [place] [address] [phone] 08/14/20, 9:39 AM   Willow Ora 08/14/2020, 9:37 AM

## 2020-08-14 NOTE — Discharge Summary (Signed)
Physician Discharge Summary  Jachin Coury Heslin ZGY:[id number] DOB: 1951/09/09 DOA: 08/08/2020  PCP: Celene Squibb, MD  Admit date: 08/08/2020 Discharge date: 08/14/2020  Admitted From: Home Disposition: Home  Recommendations for Outpatient Follow-up:  1. Follow up with PCP in 1-2 weeks 2. Please obtain BMP/CBC in one week 3. Please continue to keep logs of your glucose premeal and at night, follow-up closely with PCP as above  Home Health: None Equipment/Devices: Oxygen, 2 L nasal cannula around-the-clock  Discharge Condition: Stable CODE STATUS: Full Diet recommendation: Low-salt, diabetic diet  Brief/Interim Summary: Tim Day is a 69 y.o. male with a history of T2DM, HTN, HLD, GERD, and recent hospitalization for covid-19 pneumonia (discharged 9/3 on 6LPM O2; completed remdesivir infusions as outpatient 9/5) who reported worsening shortness of breath despite taking decadron as directed. In the ED 9/7, he was hypoxemic requiring 15L NRB with tachypnea, lactic acid elevation, CRP 2.2, and d-dimer >20. CTA chest showed no evidence of PE but demonstrated widespread airspace opacities and mild pneumomediastinum. He was readmitted for acute hypoxemic respiratory failure due to covid-19 multifocal pneumonia, given IV steroids, baricitinib, and empiric full dose lovenox. Subsequent LE venous U/S confirmed presence of acute bilateral DVTs.   Patient admitted as above with worsening respiratory status and hypoxia status post Covid found to have acute bilateral DVTs with concern for PE but no definitive PE was noted on imaging.  On anticoagulation patient symptoms improved drastically.  Imaging was also notable for mild pneumomediastinum, pulmonology/PCCM was sidelined but indicated that only supportive care was indicated given size and location.  At this time patient has been able to wean down on oxygen quite aggressively, currently on 2 L nasal cannula at rest and with ambulation  without hypoxia although ongoing dyspnea which we discussed would likely be somewhat ongoing over the next few weeks given his recent diagnosis with Covid and suspected PE.  We continue to recommend ongoing activity as tolerated, proning incentive spirometry and flutter at home.  We discussed need for ongoing quarantine from previous testing.  Patient was otherwise placed on Eliquis for ongoing DVT treatment, will need outpatient follow-up and imaging per protocol per PCP.  Given ongoing elevated glucose levels in the setting of steroid use and diabetes will send patient home with glucometer kit lancets as well as Toujeo 25 units daily.  Patient will need to diligently follow his glucose levels ACHS and discuss with PCP closely, as he weans off steroids if his glucose is markedly well controlled would consider reinitiating glipizide and discontinuing insulin.  Discharge Diagnoses:  Principal Problem:   Acute hypoxemic respiratory failure due to COVID-19 [employer] Active Problems:   DM2 (diabetes mellitus, type 2) (HCC)   Benign essential HTN   Dyslipidemia   Esophageal reflux   Sepsis ([place])   Hyponatremia  Discharge Instructions  Discharge Instructions    Call MD for:  difficulty breathing, headache or visual disturbances   Complete by: As directed    Diet - low sodium heart healthy   Complete by: As directed    Increase activity slowly   Complete by: As directed      Allergies as of 08/14/2020      Reactions   Eggs Or Egg-derived Products Hives      Medication List    STOP taking these medications   dexamethasone 6 MG tablet Commonly known as: DECADRON   glipiZIDE 5 MG tablet Commonly known as: GLUCOTROL     TAKE these medications   acetaminophen 325  MG tablet Commonly known as: TYLENOL Take 2 tablets (650 mg total) by mouth every 6 (six) hours as needed for mild pain, fever or headache.   amLODipine 10 MG tablet Commonly known as: NORVASC Take 10 mg by mouth daily.    apixaban 5 MG Tabs tablet Commonly known as: ELIQUIS Take 2 tablets (10 mg total) by mouth 2 (two) times daily for 2 days.   apixaban 5 MG Tabs tablet Commonly known as: ELIQUIS Take 1 tablet (5 mg total) by mouth 2 (two) times daily. Start taking on: August 16, 2020   ascorbic acid 500 MG tablet Commonly known as: VITAMIN C Take 1 tablet (500 mg total) by mouth daily.   aspirin EC 81 MG tablet Take 81 mg by mouth daily.   blood glucose meter kit and supplies Kit Dispense based on patient and insurance preference. Use up to four times daily as directed. (FOR ICD-9 250.00, 250.01).   co-enzyme Q-10 50 MG capsule Take 50 mg by mouth daily.   guaiFENesin-dextromethorphan 100-10 MG/5ML syrup Commonly known as: ROBITUSSIN DM Take 10 mLs by mouth every 4 (four) hours as needed for cough.   insulin glargine (1 Unit Dial) 300 UNIT/ML Solostar Pen Commonly known as: TOUJEO Inject 25 Units into the skin every morning.   metFORMIN 1000 MG tablet Commonly known as: GLUCOPHAGE Take 1 tablet (1,000 mg total) by mouth 2 (two) times daily with a meal.   olmesartan-hydrochlorothiazide 40-25 MG tablet Commonly known as: BENICAR HCT Take 1 tablet by mouth daily. Please schedule annual appt with Dr. Percival Spanish for refills. [phone]. 1st attempt   omeprazole 20 MG capsule Commonly known as: PriLOSEC Take 1 capsule (20 mg total) by mouth daily.   Pen Needles 32G X 4 MM Misc 1 Package by Does not apply route 4 (four) times daily -  before meals and at bedtime.   predniSONE 10 MG tablet Commonly known as: DELTASONE Take 4 tablets (40 mg total) by mouth daily for 3 days, THEN 3 tablets (30 mg total) daily for 3 days, THEN 2 tablets (20 mg total) daily for 3 days, THEN 1 tablet (10 mg total) daily for 3 days. Start taking on: August 14, 3845   Trulicity 1.5 KZ/9.9JT Sopn Generic drug: Dulaglutide Inject 1.5 mg into the skin once a week.   zinc sulfate 220 (50 Zn) MG  capsule Take 1 capsule (220 mg total) by mouth daily.            Durable Medical Equipment  (From admission, onward)         Start     Ordered   08/14/20 1557  DME Oxygen  Once       Question Answer Comment  Length of Need 6 Months   Mode or (Route) Nasal cannula   Liters per Minute 2   Frequency Continuous (stationary and portable oxygen unit needed)   Oxygen delivery system Gas      08/14/20 1557          Allergies  Allergen Reactions  . Eggs Or Egg-Derived Products Hives    Consultations:  None   Procedures/Studies: CT Angio Chest PE W and/or Wo Contrast  Result Date: 08/08/2020 CLINICAL DATA:  COVID-19 positive, shortness of breath EXAM: CT ANGIOGRAPHY CHEST WITH CONTRAST TECHNIQUE: Multidetector CT imaging of the chest was performed using the standard protocol during bolus administration of intravenous contrast. Multiplanar CT image reconstructions and MIPs were obtained to evaluate the vascular anatomy. CONTRAST:  75m OMNIPAQUE IOHEXOL 350  MG/ML SOLN COMPARISON:  08/08/2020, 08/02/2020 FINDINGS: Cardiovascular: This is a technically adequate evaluation of the pulmonary vasculature. No filling defects or pulmonary emboli. The heart is unremarkable without pericardial effusion. Mild atherosclerosis of the aorta and LAD distribution of the coronary vasculature. Mediastinum/Nodes: There is a small amount of pneumomediastinum anterior to the right hilum and left mainstem bronchus, likely result of barotrauma. No pathologic adenopathy. Thyroid, trachea, and esophagus are unremarkable. Lungs/Pleura: Multifocal bilateral areas of ground-glass airspace disease consistent with COVID 19 pneumonia. No effusion or pneumothorax. The central airways are patent. Upper Abdomen: No acute abnormality. Musculoskeletal: No acute or destructive bony lesions. Reconstructed images demonstrate no additional findings. Review of the MIP images confirms the above findings. IMPRESSION: 1.  Widespread bilateral ground-glass airspace disease consistent with multifocal COVID-19 pneumonia. 2. Minimal pneumomediastinum consistent with barotrauma. 3. No evidence of pulmonary embolus. 4.  Aortic Atherosclerosis (ICD10-I70.0). These results were called by telephone at the time of interpretation on 08/08/2020 at 3:32 pm to provider Antony Blackbird, who verbally acknowledged these results. Electronically Signed   By: Randa Ngo M.D.   On: 08/08/2020 15:33   DG Chest Port 1 View  Result Date: 08/08/2020 CLINICAL DATA:  Shortness of breath with reported COVID-19 positive EXAM: PORTABLE CHEST 1 VIEW COMPARISON:  August 02, 2020 FINDINGS: There is overall less opacity compared to recent prior study. Subtle ill-defined opacity remains in each mid lung. There is slight atelectasis in each lung base. No new opacity evident. Heart size and pulmonary vascularity are normal. No adenopathy. A lead overlies apparent left fourth rib fracture. IMPRESSION: Overall less opacity in the lungs compared to recent study. Subtle ill-defined opacity remains in each mid lung as well as mild left and right base atelectasis. Stable cardiac silhouette. No adenopathy. Electronically Signed   By: Lowella Grip III M.D.   On: 08/08/2020 13:19   DG Chest Portable 1 View  Result Date: 08/02/2020 CLINICAL DATA:  Dyspnea EXAM: PORTABLE CHEST 1 VIEW COMPARISON:  01/20/2015 FINDINGS: Lung volumes are small, but are symmetric. Pulmonary insufflation has improved since prior examination. Superimposed multifocal pulmonary infiltrates within the peripheral mid lung zones bilaterally are identified, most in keeping with atypical infection and compatible with the given history of COVID-19 pneumonia. There is no pneumothorax or pleural effusion. Cardiac size within normal limits. Probable acute left fourth rib fracture identified laterally. IMPRESSION: Pulmonary hypoinflation. Multifocal pulmonary infiltrates compatible with given history of  COVID-19 pneumonia. Acute left fourth rib fracture.  No pneumothorax. Electronically Signed   By: Fidela Salisbury MD   On: 08/02/2020 17:14   VAS [place] LOWER EXTREMITY VENOUS (DVT)  Result Date: 08/09/2020  Lower Venous DVTStudy Indications: Elevated D dimer. Other Indications: + Covid. Performing Technologist: Griffin Basil RCT RDMS  Examination Guidelines: A complete evaluation includes B-mode imaging, spectral Doppler, color Doppler, and power Doppler as needed of all accessible portions of each vessel. Bilateral testing is considered an integral part of a complete examination. Limited examinations for reoccurring indications may be performed as noted. The reflux portion of the exam is performed with the patient in reverse Trendelenburg.  +---------+---------------+---------+-----------+---------------+--------------+ RIGHT    CompressibilityPhasicitySpontaneityProperties     Thrombus Aging +---------+---------------+---------+-----------+---------------+--------------+ CFV      Full           Yes      Yes                                      +---------+---------------+---------+-----------+---------------+--------------+  SFJ      Full                                                             +---------+---------------+---------+-----------+---------------+--------------+ FV Prox  Full                                                             +---------+---------------+---------+-----------+---------------+--------------+ FV Mid   Full                                                             +---------+---------------+---------+-----------+---------------+--------------+ FV DistalFull                                                             +---------+---------------+---------+-----------+---------------+--------------+ PFV      Full                                                              +---------+---------------+---------+-----------+---------------+--------------+ POP      Full           Yes      Yes                                      +---------+---------------+---------+-----------+---------------+--------------+ PTV      Full                                                             +---------+---------------+---------+-----------+---------------+--------------+ PERO     None                               softly         Acute                                                      echogenic                     +---------+---------------+---------+-----------+---------------+--------------+   +---------+---------------+---------+-----------+---------------+--------------+ LEFT     CompressibilityPhasicitySpontaneityProperties     Thrombus Aging +---------+---------------+---------+-----------+---------------+--------------+ CFV  Full           Yes      Yes                                      +---------+---------------+---------+-----------+---------------+--------------+ SFJ      Full                                                             +---------+---------------+---------+-----------+---------------+--------------+ FV Prox  Full                                                             +---------+---------------+---------+-----------+---------------+--------------+ FV Mid   Full                                                             +---------+---------------+---------+-----------+---------------+--------------+ FV DistalFull                                                             +---------+---------------+---------+-----------+---------------+--------------+ PFV      Full                                                             +---------+---------------+---------+-----------+---------------+--------------+ POP      Full           Yes      Yes                                       +---------+---------------+---------+-----------+---------------+--------------+ Gastroc  None                               softly         Acute                                                      echogenic                     +---------+---------------+---------+-----------+---------------+--------------+     Summary: RIGHT: - Findings consistent with acute deep vein thrombosis involving the right peroneal veins. - No cystic structure found in the popliteal fossa.  LEFT: - Findings consistent  with acute deep vein thrombosis involving the left gastrocnemius veins. - No cystic structure found in the popliteal fossa.  *See table(s) above for measurements and observations. Electronically signed by Monica Martinez MD on 08/09/2020 at 1:53:46 PM.    Final      Subjective: No acute issues or events overnight, still somewhat dyspneic with minimal exertion but denies nausea, vomiting, diarrhea, constipation, headache, fevers, chills.   Discharge Exam: Vitals:   08/14/20 1247 08/14/20 1546  BP:  110/62  Pulse:  85  Resp:  17  Temp:  (!) 97.5 F (36.4 C)  SpO2: 95% 99%   Vitals:   08/14/20 1244 08/14/20 1245 08/14/20 1247 08/14/20 1546  BP:    110/62  Pulse:    85  Resp:    17  Temp:    (!) 97.5 F (36.4 C)  TempSrc:    Oral  SpO2: 91% (!) 88% 95% 99%  Weight:      Height:        General: Pt is alert, awake, not in acute distress Cardiovascular: RRR, S1/S2 +, no rubs, no gallops Respiratory: Scant bilateral rhonchi without overt wheeze or rales Abdominal: Soft, NT, ND, bowel sounds + Extremities: no edema, no cyanosis    The results of significant diagnostics from this hospitalization (including imaging, microbiology, ancillary and laboratory) are listed below for reference.     Microbiology: Recent Results (from the past 240 hour(s))  Blood Culture (routine x 2)     Status: None   Collection Time: 08/08/20  1:56 PM   Specimen: BLOOD  Result Value Ref Range Status    Specimen Description BLOOD RIGHT ANTECUBITAL  Final   Special Requests   Final    BOTTLES DRAWN AEROBIC AND ANAEROBIC Blood Culture adequate volume   Culture   Final    NO GROWTH 5 DAYS Performed at [facility], [address], [place], [place] [address]    Report Status 08/13/2020 FINAL  Final  Blood Culture (routine x 2)     Status: None   Collection Time: 08/08/20  1:56 PM   Specimen: BLOOD  Result Value Ref Range Status   Specimen Description BLOOD LEFT ANTECUBITAL  Final   Special Requests   Final    BOTTLES DRAWN AEROBIC ONLY Blood Culture results may not be optimal due to an inadequate volume of blood received in culture bottles   Culture   Final    NO GROWTH 5 DAYS Performed at [facility], [place] [address] [place], [place] [address]    Report Status 08/13/2020 FINAL  Final     Labs: BNP (last 3 results) Recent Labs    08/08/20 1616  BNP 74.9   Basic Metabolic Panel: Recent Labs  Lab 08/09/20 0351 08/09/20 0351 08/10/20 0607 08/11/20 0256 08/12/20 0220 08/13/20 0408 08/14/20 0425  NA 132*   < > 134* 132* 134* 132* 131*  K 4.6   < > 4.0 4.4 4.8 4.9 4.4  CL 102   < > 101 102 101 100 100  CO2 19*   < > 23 20* 22 19* 19*  GLUCOSE 305*   < > 254* 208* 140* 88 240*  BUN 30*   < > 36* 35* 31* 31* 30*  CREATININE 0.86   < > 1.13 0.98 1.03 0.97 0.97  CALCIUM 8.3*   < > 8.2* 8.2* 8.5* 8.3* 8.3*  MG 2.2  --   --   --   --   --   --  PHOS 3.9  --   --   --   --   --   --    < > = values in this interval not displayed.   Liver Function Tests: Recent Labs  Lab 08/10/20 0607 08/11/20 0256 08/12/20 0220 08/13/20 0408 08/14/20 0425  AST '28 29 28 30 27  ' ALT 41 43 43 42 43  ALKPHOS 60 55 58 65 63  BILITOT 1.0 0.8 1.1 1.5* 1.2  PROT 5.6* 5.6* 5.9* 5.7* 6.1*  ALBUMIN 2.6* 2.6* 2.7* 2.8* 2.6*   No results for input(s): LIPASE, AMYLASE in the last 168 hours. No results for input(s): AMMONIA in the last 168 hours. CBC: Recent Labs  Lab  08/09/20 0351 08/10/20 0607 08/11/20 0256 08/12/20 0220 08/14/20 0425  WBC 6.3 8.2 9.4 11.9* 12.7*  NEUTROABS 5.4 6.8 8.0*  --   --   HGB 14.3 14.5 14.6 15.0 16.4  HCT 41.2 41.5 41.3 43.4 47.4  MCV 84.1 84.5 83.4 84.4 86.8  PLT 211 227 243 269 283   Cardiac Enzymes: No results for input(s): CKTOTAL, CKMB, CKMBINDEX, TROPONINI in the last 168 hours. BNP: Invalid input(s): POCBNP CBG: Recent Labs  Lab 08/13/20 1556 08/13/20 2057 08/14/20 0538 08/14/20 1241 08/14/20 1544  GLUCAP 335* 239* 209* 227* 262*   D-Dimer No results for input(s): DDIMER in the last 72 hours. Hgb A1c No results for input(s): HGBA1C in the last 72 hours. Lipid Profile No results for input(s): CHOL, HDL, LDLCALC, TRIG, CHOLHDL, LDLDIRECT in the last 72 hours. Thyroid function studies No results for input(s): TSH, T4TOTAL, T3FREE, THYROIDAB in the last 72 hours.  Invalid input(s): FREET3 Anemia work up No results for input(s): VITAMINB12, FOLATE, FERRITIN, TIBC, IRON, RETICCTPCT in the last 72 hours. Urinalysis    Component Value Date/Time   COLORURINE YELLOW 01/21/2015 1822   APPEARANCEUR CLEAR 01/21/2015 1822   LABSPEC 1.021 01/21/2015 1822   PHURINE 5.5 01/21/2015 1822   GLUCOSEU NEGATIVE 01/21/2015 1822   HGBUR NEGATIVE 01/21/2015 1822   BILIRUBINUR NEGATIVE 01/21/2015 1822   KETONESUR NEGATIVE 01/21/2015 1822   PROTEINUR NEGATIVE 01/21/2015 1822   UROBILINOGEN 0.2 01/21/2015 1822   NITRITE NEGATIVE 01/21/2015 1822   LEUKOCYTESUR NEGATIVE 01/21/2015 1822   Sepsis Labs Invalid input(s): PROCALCITONIN,  WBC,  LACTICIDVEN Microbiology Recent Results (from the past 240 hour(s))  Blood Culture (routine x 2)     Status: None   Collection Time: 08/08/20  1:56 PM   Specimen: BLOOD  Result Value Ref Range Status   Specimen Description BLOOD RIGHT ANTECUBITAL  Final   Special Requests   Final    BOTTLES DRAWN AEROBIC AND ANAEROBIC Blood Culture adequate volume   Culture   Final    NO  GROWTH 5 DAYS Performed at [facility], [place] [address], [place], [place] [address]    Report Status 08/13/2020 FINAL  Final  Blood Culture (routine x 2)     Status: None   Collection Time: 08/08/20  1:56 PM   Specimen: BLOOD  Result Value Ref Range Status   Specimen Description BLOOD LEFT ANTECUBITAL  Final   Special Requests   Final    BOTTLES DRAWN AEROBIC ONLY Blood Culture results may not be optimal due to an inadequate volume of blood received in culture bottles   Culture   Final    NO GROWTH 5 DAYS Performed at [facility], [place] [address], [place], [place] [address]    Report Status 08/13/2020 FINAL  Final  Time coordinating discharge: Over 30 minutes  SIGNED:   Little Ishikawa, DO [facility] 08/14/2020, 4:02 PM Pager   If 7PM-7AM, please contact night-coverage [email or web address]

## 2020-08-19 ENCOUNTER — Emergency Department (HOSPITAL_COMMUNITY): Payer: 59

## 2020-08-19 ENCOUNTER — Inpatient Hospital Stay (HOSPITAL_COMMUNITY): Payer: 59

## 2020-08-19 ENCOUNTER — Encounter (HOSPITAL_COMMUNITY): Payer: Self-pay

## 2020-08-19 ENCOUNTER — Inpatient Hospital Stay (HOSPITAL_COMMUNITY)
Admission: EM | Admit: 2020-08-19 | Discharge: 2020-09-29 | DRG: 208 | Disposition: A | Discharge status: Skilled Nursing Facility | Payer: 59 | Attending: Family Medicine | Admitting: Family Medicine

## 2020-08-19 ENCOUNTER — Other Ambulatory Visit: Payer: Self-pay

## 2020-08-19 DIAGNOSIS — T85698A Other mechanical complication of other specified internal prosthetic devices, implants and grafts, initial encounter: Secondary | ICD-10-CM | POA: Diagnosis not present

## 2020-08-19 DIAGNOSIS — J189 Pneumonia, unspecified organism: Secondary | ICD-10-CM

## 2020-08-19 DIAGNOSIS — Z79899 Other long term (current) drug therapy: Secondary | ICD-10-CM

## 2020-08-19 DIAGNOSIS — B9689 Other specified bacterial agents as the cause of diseases classified elsewhere: Secondary | ICD-10-CM | POA: Diagnosis not present

## 2020-08-19 DIAGNOSIS — J9601 Acute respiratory failure with hypoxia: Secondary | ICD-10-CM | POA: Diagnosis not present

## 2020-08-19 DIAGNOSIS — Z833 Family history of diabetes mellitus: Secondary | ICD-10-CM

## 2020-08-19 DIAGNOSIS — I82403 Acute embolism and thrombosis of unspecified deep veins of lower extremity, bilateral: Secondary | ICD-10-CM | POA: Diagnosis present

## 2020-08-19 DIAGNOSIS — L89322 Pressure ulcer of left buttock, stage 2: Secondary | ICD-10-CM | POA: Diagnosis present

## 2020-08-19 DIAGNOSIS — Y95 Nosocomial condition: Secondary | ICD-10-CM | POA: Diagnosis present

## 2020-08-19 DIAGNOSIS — Z794 Long term (current) use of insulin: Secondary | ICD-10-CM

## 2020-08-19 DIAGNOSIS — H5461 Unqualified visual loss, right eye, normal vision left eye: Secondary | ICD-10-CM | POA: Diagnosis present

## 2020-08-19 DIAGNOSIS — F32A Depression, unspecified: Secondary | ICD-10-CM | POA: Diagnosis not present

## 2020-08-19 DIAGNOSIS — Z6826 Body mass index (BMI) 26.0-26.9, adult: Secondary | ICD-10-CM | POA: Diagnosis not present

## 2020-08-19 DIAGNOSIS — E119 Type 2 diabetes mellitus without complications: Secondary | ICD-10-CM | POA: Diagnosis not present

## 2020-08-19 DIAGNOSIS — I10 Essential (primary) hypertension: Secondary | ICD-10-CM | POA: Diagnosis present

## 2020-08-19 DIAGNOSIS — L89312 Pressure ulcer of right buttock, stage 2: Secondary | ICD-10-CM | POA: Diagnosis present

## 2020-08-19 DIAGNOSIS — Z91012 Allergy to eggs: Secondary | ICD-10-CM

## 2020-08-19 DIAGNOSIS — R338 Other retention of urine: Secondary | ICD-10-CM | POA: Diagnosis not present

## 2020-08-19 DIAGNOSIS — R0603 Acute respiratory distress: Secondary | ICD-10-CM

## 2020-08-19 DIAGNOSIS — F4322 Adjustment disorder with anxiety: Secondary | ICD-10-CM | POA: Diagnosis present

## 2020-08-19 DIAGNOSIS — J80 Acute respiratory distress syndrome: Secondary | ICD-10-CM | POA: Diagnosis not present

## 2020-08-19 DIAGNOSIS — R54 Age-related physical debility: Secondary | ICD-10-CM | POA: Diagnosis present

## 2020-08-19 DIAGNOSIS — N401 Enlarged prostate with lower urinary tract symptoms: Secondary | ICD-10-CM | POA: Diagnosis not present

## 2020-08-19 DIAGNOSIS — J1282 Pneumonia due to coronavirus disease 2019: Secondary | ICD-10-CM | POA: Diagnosis present

## 2020-08-19 DIAGNOSIS — E785 Hyperlipidemia, unspecified: Secondary | ICD-10-CM | POA: Diagnosis present

## 2020-08-19 DIAGNOSIS — R0602 Shortness of breath: Secondary | ICD-10-CM

## 2020-08-19 DIAGNOSIS — R519 Headache, unspecified: Secondary | ICD-10-CM | POA: Diagnosis not present

## 2020-08-19 DIAGNOSIS — I2510 Atherosclerotic heart disease of native coronary artery without angina pectoris: Secondary | ICD-10-CM | POA: Diagnosis present

## 2020-08-19 DIAGNOSIS — Z951 Presence of aortocoronary bypass graft: Secondary | ICD-10-CM

## 2020-08-19 DIAGNOSIS — Z938 Other artificial opening status: Secondary | ICD-10-CM

## 2020-08-19 DIAGNOSIS — Z9689 Presence of other specified functional implants: Secondary | ICD-10-CM

## 2020-08-19 DIAGNOSIS — Z7982 Long term (current) use of aspirin: Secondary | ICD-10-CM

## 2020-08-19 DIAGNOSIS — Z7901 Long term (current) use of anticoagulants: Secondary | ICD-10-CM

## 2020-08-19 DIAGNOSIS — E875 Hyperkalemia: Secondary | ICD-10-CM | POA: Diagnosis not present

## 2020-08-19 DIAGNOSIS — N179 Acute kidney failure, unspecified: Secondary | ICD-10-CM | POA: Diagnosis not present

## 2020-08-19 DIAGNOSIS — N3000 Acute cystitis without hematuria: Secondary | ICD-10-CM | POA: Diagnosis not present

## 2020-08-19 DIAGNOSIS — J9311 Primary spontaneous pneumothorax: Secondary | ICD-10-CM | POA: Diagnosis present

## 2020-08-19 DIAGNOSIS — E43 Unspecified severe protein-calorie malnutrition: Secondary | ICD-10-CM | POA: Diagnosis not present

## 2020-08-19 DIAGNOSIS — Z23 Encounter for immunization: Secondary | ICD-10-CM

## 2020-08-19 DIAGNOSIS — Z452 Encounter for adjustment and management of vascular access device: Secondary | ICD-10-CM

## 2020-08-19 DIAGNOSIS — Z85820 Personal history of malignant melanoma of skin: Secondary | ICD-10-CM

## 2020-08-19 DIAGNOSIS — J9690 Respiratory failure, unspecified, unspecified whether with hypoxia or hypercapnia: Secondary | ICD-10-CM

## 2020-08-19 DIAGNOSIS — J982 Interstitial emphysema: Secondary | ICD-10-CM | POA: Diagnosis present

## 2020-08-19 DIAGNOSIS — L89152 Pressure ulcer of sacral region, stage 2: Secondary | ICD-10-CM | POA: Diagnosis not present

## 2020-08-19 DIAGNOSIS — Z4682 Encounter for fitting and adjustment of non-vascular catheter: Secondary | ICD-10-CM

## 2020-08-19 DIAGNOSIS — J9382 Other air leak: Secondary | ICD-10-CM | POA: Diagnosis not present

## 2020-08-19 DIAGNOSIS — H1132 Conjunctival hemorrhage, left eye: Secondary | ICD-10-CM | POA: Diagnosis not present

## 2020-08-19 DIAGNOSIS — E782 Mixed hyperlipidemia: Secondary | ICD-10-CM | POA: Diagnosis present

## 2020-08-19 DIAGNOSIS — Z9981 Dependence on supplemental oxygen: Secondary | ICD-10-CM

## 2020-08-19 DIAGNOSIS — J95811 Postprocedural pneumothorax: Secondary | ICD-10-CM

## 2020-08-19 DIAGNOSIS — N138 Other obstructive and reflux uropathy: Secondary | ICD-10-CM | POA: Diagnosis not present

## 2020-08-19 DIAGNOSIS — E1165 Type 2 diabetes mellitus with hyperglycemia: Secondary | ICD-10-CM | POA: Diagnosis present

## 2020-08-19 DIAGNOSIS — K219 Gastro-esophageal reflux disease without esophagitis: Secondary | ICD-10-CM | POA: Diagnosis present

## 2020-08-19 DIAGNOSIS — Z7984 Long term (current) use of oral hypoglycemic drugs: Secondary | ICD-10-CM

## 2020-08-19 DIAGNOSIS — Z8249 Family history of ischemic heart disease and other diseases of the circulatory system: Secondary | ICD-10-CM

## 2020-08-19 DIAGNOSIS — U071 COVID-19: Principal | ICD-10-CM | POA: Diagnosis present

## 2020-08-19 DIAGNOSIS — E1169 Type 2 diabetes mellitus with other specified complication: Secondary | ICD-10-CM

## 2020-08-19 DIAGNOSIS — E871 Hypo-osmolality and hyponatremia: Secondary | ICD-10-CM | POA: Diagnosis not present

## 2020-08-19 DIAGNOSIS — L8990 Pressure ulcer of unspecified site, unspecified stage: Secondary | ICD-10-CM | POA: Insufficient documentation

## 2020-08-19 DIAGNOSIS — R0902 Hypoxemia: Secondary | ICD-10-CM

## 2020-08-19 DIAGNOSIS — Z4659 Encounter for fitting and adjustment of other gastrointestinal appliance and device: Secondary | ICD-10-CM

## 2020-08-19 DIAGNOSIS — K5900 Constipation, unspecified: Secondary | ICD-10-CM | POA: Diagnosis not present

## 2020-08-19 DIAGNOSIS — Z09 Encounter for follow-up examination after completed treatment for conditions other than malignant neoplasm: Secondary | ICD-10-CM

## 2020-08-19 DIAGNOSIS — M7918 Myalgia, other site: Secondary | ICD-10-CM | POA: Diagnosis present

## 2020-08-19 DIAGNOSIS — J939 Pneumothorax, unspecified: Secondary | ICD-10-CM

## 2020-08-19 DIAGNOSIS — Z87710 Personal history of (corrected) hypospadias: Secondary | ICD-10-CM

## 2020-08-19 LAB — CBC WITH DIFFERENTIAL/PLATELET
Abs Immature Granulocytes: 0.25 10*3/uL — ABNORMAL HIGH (ref 0.00–0.07)
Basophils Absolute: 0 10*3/uL (ref 0.0–0.1)
Basophils Relative: 0 %
Eosinophils Absolute: 0 10*3/uL (ref 0.0–0.5)
Eosinophils Relative: 0 %
HCT: 50.9 % (ref 39.0–52.0)
Hemoglobin: 17.7 g/dL — ABNORMAL HIGH (ref 13.0–17.0)
Immature Granulocytes: 1 %
Lymphocytes Relative: 3 %
Lymphs Abs: 0.7 10*3/uL (ref 0.7–4.0)
MCH: 29.6 pg (ref 26.0–34.0)
MCHC: 34.8 g/dL (ref 30.0–36.0)
MCV: 85.3 fL (ref 80.0–100.0)
Monocytes Absolute: 0.8 10*3/uL (ref 0.1–1.0)
Monocytes Relative: 6 %
Neutro Abs: 16.9 10*3/uL — ABNORMAL HIGH (ref 1.7–7.7)
Neutrophils Relative %: 89 %
Platelets: 391 10*3/uL (ref 150–400)
RBC: 5.97 MIL/uL — ABNORMAL HIGH (ref 4.22–5.81)
RDW: 13.3 % (ref 11.5–15.5)
WBC: 19 10*3/uL — ABNORMAL HIGH (ref 4.0–10.5)
nRBC: 0 % (ref 0.0–0.2)

## 2020-08-19 LAB — BLOOD GAS, VENOUS
Acid-base deficit: 3.9 mmol/L — ABNORMAL HIGH (ref 0.0–2.0)
Bicarbonate: 21.2 mmol/L (ref 20.0–28.0)
FIO2: 100
O2 Saturation: 74.9 %
Patient temperature: 36.8
pCO2, Ven: 32.8 mmHg — ABNORMAL LOW (ref 44.0–60.0)
pH, Ven: 7.403 (ref 7.250–7.430)
pO2, Ven: 42.9 mmHg (ref 32.0–45.0)

## 2020-08-19 LAB — COMPREHENSIVE METABOLIC PANEL
ALT: 36 U/L (ref 0–44)
AST: 27 U/L (ref 15–41)
Albumin: 3.1 g/dL — ABNORMAL LOW (ref 3.5–5.0)
Alkaline Phosphatase: 81 U/L (ref 38–126)
Anion gap: 14 (ref 5–15)
BUN: 40 mg/dL — ABNORMAL HIGH (ref 8–23)
CO2: 21 mmol/L — ABNORMAL LOW (ref 22–32)
Calcium: 8.6 mg/dL — ABNORMAL LOW (ref 8.9–10.3)
Chloride: 96 mmol/L — ABNORMAL LOW (ref 98–111)
Creatinine, Ser: 1.07 mg/dL (ref 0.61–1.24)
GFR calc Af Amer: >60 mL/min (ref >60)
GFR calc non Af Amer: >60 mL/min (ref >60)
Glucose, Bld: 292 mg/dL — ABNORMAL HIGH (ref 70–99)
Potassium: 4.3 mmol/L (ref 3.5–5.1)
Sodium: 131 mmol/L — ABNORMAL LOW (ref 135–145)
Total Bilirubin: 1.5 mg/dL — ABNORMAL HIGH (ref 0.3–1.2)
Total Protein: 7.3 g/dL (ref 6.5–8.1)

## 2020-08-19 LAB — C-REACTIVE PROTEIN: CRP: 17.7 mg/dL — ABNORMAL HIGH (ref <1.0)

## 2020-08-19 LAB — CBC
HCT: 49.8 % (ref 39.0–52.0)
Hemoglobin: 16.9 g/dL (ref 13.0–17.0)
MCH: 28.8 pg (ref 26.0–34.0)
MCHC: 33.9 g/dL (ref 30.0–36.0)
MCV: 85 fL (ref 80.0–100.0)
Platelets: 267 10*3/uL (ref 150–400)
RBC: 5.86 MIL/uL — ABNORMAL HIGH (ref 4.22–5.81)
RDW: 13.4 % (ref 11.5–15.5)
WBC: 12.6 10*3/uL — ABNORMAL HIGH (ref 4.0–10.5)
nRBC: 0 % (ref 0.0–0.2)

## 2020-08-19 LAB — SARS CORONAVIRUS 2 BY RT PCR (HOSPITAL ORDER, PERFORMED IN ~~LOC~~ HOSPITAL LAB): SARS Coronavirus 2: POSITIVE — AB

## 2020-08-19 LAB — LACTIC ACID, PLASMA
Lactic Acid, Venous: 2.6 mmol/L (ref 0.5–1.9)
Lactic Acid, Venous: 2.2 mmol/L (ref 0.5–1.9)

## 2020-08-19 LAB — FERRITIN: Ferritin: 1349 ng/mL — ABNORMAL HIGH (ref 24–336)

## 2020-08-19 LAB — CBG MONITORING, ED: Glucose-Capillary: 297 mg/dL — ABNORMAL HIGH (ref 70–99)

## 2020-08-19 LAB — PROCALCITONIN
Procalcitonin: 0.17 ng/mL
Procalcitonin: 0.22 ng/mL

## 2020-08-19 LAB — CREATININE, SERUM
Creatinine, Ser: 1.02 mg/dL (ref 0.61–1.24)
GFR calc Af Amer: >60 mL/min (ref >60)
GFR calc non Af Amer: >60 mL/min (ref >60)

## 2020-08-19 LAB — BRAIN NATRIURETIC PEPTIDE: B Natriuretic Peptide: 111 pg/mL — ABNORMAL HIGH (ref 0.0–100.0)

## 2020-08-19 LAB — D-DIMER, QUANTITATIVE: D-Dimer, Quant: 2.69 ug/mL-FEU — ABNORMAL HIGH (ref 0.00–0.50)

## 2020-08-19 LAB — GLUCOSE, CAPILLARY
Glucose-Capillary: 229 mg/dL — ABNORMAL HIGH (ref 70–99)
Glucose-Capillary: 317 mg/dL — ABNORMAL HIGH (ref 70–99)
Glucose-Capillary: 327 mg/dL — ABNORMAL HIGH (ref 70–99)

## 2020-08-19 LAB — APTT: aPTT: 28 seconds (ref 24–36)

## 2020-08-19 LAB — HEPARIN LEVEL (UNFRACTIONATED): Heparin Unfractionated: 1.42 IU/mL — ABNORMAL HIGH (ref 0.30–0.70)

## 2020-08-19 MED ORDER — LIDOCAINE-EPINEPHRINE (PF) 2 %-1:200000 IJ SOLN
INTRAMUSCULAR | Status: AC
  Filled 2020-08-19: qty 20

## 2020-08-19 MED ORDER — LIDOCAINE-EPINEPHRINE (PF) 1 %-1:200000 IJ SOLN
20.0000 mL | Freq: Once | INTRAMUSCULAR | Status: AC
  Administered 2020-08-19: 20 mL via INTRADERMAL

## 2020-08-19 MED ORDER — PIPERACILLIN-TAZOBACTAM 3.375 G IVPB 30 MIN: 3.3750 g | Freq: Once | INTRAVENOUS | Status: DC

## 2020-08-19 MED ORDER — METHYLPREDNISOLONE SODIUM SUCC 125 MG IJ SOLR
80.0000 mg | Freq: Two times a day (BID) | INTRAMUSCULAR | Status: DC
  Administered 2020-08-19 – 2020-08-20 (×3): 80 mg via INTRAVENOUS
  Filled 2020-08-19 (×3): qty 2

## 2020-08-19 MED ORDER — NOREPINEPHRINE 4 MG/250ML-% IV SOLN
0.0000 ug/min | INTRAVENOUS | Status: DC
  Administered 2020-08-20: 24 ug/min via INTRAVENOUS
  Administered 2020-08-21: 20 ug/min via INTRAVENOUS
  Administered 2020-08-21: 5 ug/min via INTRAVENOUS
  Administered 2020-08-21: 10 ug/min via INTRAVENOUS
  Administered 2020-08-21: 22 ug/min via INTRAVENOUS
  Filled 2020-08-19 (×7): qty 250

## 2020-08-19 MED ORDER — VANCOMYCIN HCL 1500 MG/300ML IV SOLN
1500.0000 mg | Freq: Once | INTRAVENOUS | Status: AC
  Administered 2020-08-19: 1500 mg via INTRAVENOUS
  Filled 2020-08-19: qty 300

## 2020-08-19 MED ORDER — LINAGLIPTIN 5 MG PO TABS
5.0000 mg | ORAL_TABLET | Freq: Every day | ORAL | Status: DC
  Administered 2020-08-19 – 2020-08-20 (×2): 5 mg via ORAL
  Filled 2020-08-19 (×2): qty 1

## 2020-08-19 MED ORDER — INSULIN ASPART 100 UNIT/ML ~~LOC~~ SOLN
0.0000 [IU] | SUBCUTANEOUS | Status: DC
  Administered 2020-08-19: 8 [IU] via SUBCUTANEOUS
  Administered 2020-08-19: 11 [IU] via SUBCUTANEOUS
  Administered 2020-08-20: 5 [IU] via SUBCUTANEOUS
  Administered 2020-08-20: 3 [IU] via SUBCUTANEOUS
  Filled 2020-08-19: qty 1

## 2020-08-19 MED ORDER — LIDOCAINE-EPINEPHRINE (PF) 1 %-1:200000 IJ SOLN
30.0000 mL | Freq: Once | INTRAMUSCULAR | Status: AC
  Administered 2020-08-19: 30 mL via INTRADERMAL
  Filled 2020-08-19: qty 30

## 2020-08-19 MED ORDER — INSULIN DETEMIR 100 UNIT/ML ~~LOC~~ SOLN
0.1000 [IU]/kg | Freq: Two times a day (BID) | SUBCUTANEOUS | Status: DC
  Administered 2020-08-19 – 2020-08-20 (×3): 8 [IU] via SUBCUTANEOUS
  Filled 2020-08-19 (×5): qty 0.08

## 2020-08-19 MED ORDER — ALBUTEROL SULFATE HFA 108 (90 BASE) MCG/ACT IN AERS
2.0000 | INHALATION_SPRAY | Freq: Four times a day (QID) | RESPIRATORY_TRACT | Status: DC
  Administered 2020-08-19 – 2020-08-20 (×2): 2 via RESPIRATORY_TRACT
  Filled 2020-08-19 (×2): qty 6.7

## 2020-08-19 MED ORDER — ONDANSETRON HCL 4 MG/2ML IJ SOLN: 4.0000 mg | Freq: Four times a day (QID) | INTRAMUSCULAR | Status: DC | PRN

## 2020-08-19 MED ORDER — ONDANSETRON HCL 4 MG PO TABS: 4.0000 mg | ORAL_TABLET | Freq: Four times a day (QID) | ORAL | Status: DC | PRN

## 2020-08-19 MED ORDER — POLYETHYLENE GLYCOL 3350 17 G PO PACK: 17.0000 g | PACK | Freq: Every day | ORAL | Status: DC | PRN

## 2020-08-19 MED ORDER — PIPERACILLIN-TAZOBACTAM 3.375 G IVPB
3.3750 g | Freq: Three times a day (TID) | INTRAVENOUS | Status: AC
  Administered 2020-08-19 – 2020-08-23 (×13): 3.375 g via INTRAVENOUS
  Filled 2020-08-19 (×13): qty 50

## 2020-08-19 MED ORDER — BARICITINIB 2 MG PO TABS
4.0000 mg | ORAL_TABLET | Freq: Every day | ORAL | Status: DC
  Administered 2020-08-19 – 2020-08-20 (×2): 4 mg via ORAL
  Filled 2020-08-19 (×2): qty 2

## 2020-08-19 MED ORDER — PREDNISONE 20 MG PO TABS: 50.0000 mg | ORAL_TABLET | Freq: Every day | ORAL | Status: DC

## 2020-08-19 MED ORDER — PANTOPRAZOLE SODIUM 40 MG IV SOLR
40.0000 mg | Freq: Every day | INTRAVENOUS | Status: DC
  Administered 2020-08-19 – 2020-08-24 (×6): 40 mg via INTRAVENOUS
  Filled 2020-08-19 (×6): qty 40

## 2020-08-19 MED ORDER — LIDOCAINE HCL (PF) 1 % IJ SOLN
INTRAMUSCULAR | Status: AC
  Filled 2020-08-19: qty 30

## 2020-08-19 MED ORDER — INSULIN ASPART 100 UNIT/ML ~~LOC~~ SOLN
0.0000 [IU] | SUBCUTANEOUS | Status: DC
  Administered 2020-08-20: 11 [IU] via SUBCUTANEOUS
  Administered 2020-08-20: 3 [IU] via SUBCUTANEOUS

## 2020-08-19 MED ORDER — ACETAMINOPHEN 325 MG PO TABS: 650.0000 mg | ORAL_TABLET | Freq: Four times a day (QID) | ORAL | Status: DC | PRN

## 2020-08-19 MED ORDER — POVIDONE-IODINE 10 % EX SOLN
CUTANEOUS | Status: AC
  Filled 2020-08-19: qty 15

## 2020-08-19 MED ORDER — SODIUM CHLORIDE 0.9 % IV SOLN: 250.0000 mL | INTRAVENOUS | Status: DC

## 2020-08-19 MED ORDER — INSULIN GLARGINE 100 UNIT/ML ~~LOC~~ SOLN
25.0000 [IU] | Freq: Every morning | SUBCUTANEOUS | Status: DC
  Filled 2020-08-19 (×3): qty 0.25

## 2020-08-19 MED ORDER — CHLORHEXIDINE GLUCONATE CLOTH 2 % EX PADS
6.0000 | MEDICATED_PAD | Freq: Every day | CUTANEOUS | Status: DC
  Administered 2020-08-20 – 2020-08-25 (×4): 6 via TOPICAL

## 2020-08-19 MED ORDER — HEPARIN (PORCINE) 25000 UT/250ML-% IV SOLN
1400.0000 [IU]/h | INTRAVENOUS | Status: DC
  Administered 2020-08-19 – 2020-08-20 (×2): 1400 [IU]/h via INTRAVENOUS
  Filled 2020-08-19 (×2): qty 250

## 2020-08-19 MED ORDER — DOCUSATE SODIUM 100 MG PO CAPS: 100.0000 mg | ORAL_CAPSULE | Freq: Two times a day (BID) | ORAL | Status: DC | PRN

## 2020-08-19 MED ORDER — PIPERACILLIN-TAZOBACTAM 3.375 G IVPB 30 MIN
3.3750 g | Freq: Once | INTRAVENOUS | Status: AC
  Administered 2020-08-19: 3.375 g via INTRAVENOUS
  Filled 2020-08-19: qty 50
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
## 2020-08-19 NOTE — ED Notes (Signed)
Date and time results received: 08/19/20 1052 (use smartphrase ".now" to insert current time)  Test: lactic  Critical Value: 2.6  Name of Provider Notified: Dr. Reather Converse  Orders Received? Or Actions Taken?: no/na

## 2020-08-19 NOTE — ED Notes (Signed)
Lab at bedside at this time.  

## 2020-08-19 NOTE — ED Notes (Signed)
Entered room and introduced self to patient and explained role in the plan of care. On arrival, pt is on Bi-Pap, left sided chest tube to low suction. Continuous cardiac and pulse oximetry monitor in place. Bed is locked in the lowest position, side rails x2, call bell within reach. Pt educated on hourly rounding, door shut and curtain remains partially open for better observation. All questions and concerns voiced addressed at this time.

## 2020-08-19 NOTE — ED Notes (Signed)
Date and time results received: 08/19/20 1305 (use smartphrase ".now" to insert current time)  Test: lactic Critical Value: 2.2  Name of Provider Notified: Dr. Roderic Palau  Orders Received? Or Actions Taken?: no/na

## 2020-08-19 NOTE — ED Notes (Signed)
Dr. Rogene Houston to bedside for assessment, after repeat chest x-ray, pt became more short of breath and SpO2 declining to 84-86% on heated high flow. This RN remains at bedside at this time.
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
## 2020-08-19 NOTE — Progress Notes (Signed)
PCCM brief note   Case and imaging reviewed. Post COVID 19 initial diagnosis late August. Here with worsening O2 saturations, L PTX, incomplete expansion with chest tube, question if chest tube all the way in pleural space, would have to eval in person.  Given degree of O2 requirements, reasonable to transfer to ICU. Agree with limiting BIPAP if able.  If hemodynamically doing poorly, would do large bore chest tube on left.  Please reach out to my pager if any immediate questions/concerns.    Erskine Emery MD [facility] 08/19/2020 11:59 AM Personal pager: [phone] If unanswered, please page CCM On-call: [phone]

## 2020-08-19 NOTE — ED Notes (Signed)
Handoff provided to Critical Care transport team.

## 2020-08-19 NOTE — Progress Notes (Signed)
ANTICOAGULATION CONSULT NOTE - Initial Consult  Pharmacy Consult for Heparin Indication: VTE treatment  Allergies  Allergen Reactions  . Eggs Or Egg-Derived Products Hives    Patient Measurements: Height: 5\' 10"  (177.8 cm) Weight: 84 kg (185 lb 3 oz) IBW/kg (Calculated) : 73 HEPARIN DW (KG): 84  Vital Signs: Temp: 98.1 F (36.7 C) (09/18 1136) Temp Source: Axillary (09/18 1136) BP: 142/80 (09/18 1136) Pulse Rate: 107 (09/18 1136)  Labs: Recent Labs    08/19/20 0730  HGB 17.7*  HCT 50.9  PLT 391  CREATININE 1.07    Estimated Creatinine Clearance: 69.2 mL/min (by C-G formula based on SCr of 1.07 mg/dL).   Medical History: Past Medical History:  Diagnosis Date  . Acute respiratory disease   . Atypical mole 12/30/2012   severe left post shoulder tx exc  . Candidiasis of urogenital sites   . Diabetes mellitus   . Diffuse myofascial pain syndrome   . Esophageal reflux   . Hypertension   . Impacted cerumen of right ear   . Melanoma ([place]) 06/14/2011   left ear mohs  . Mixed hyperlipidemia   . MM (malignant melanoma of skin) ([place]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  . Primary insomnia   . Seborrheic dermatitis, unspecified   . Squamous cell carcinoma of skin 06/14/2011   left forearm medial cx3 32fu  . Thrombocytopenia, unspecified ([place])     Medications:  See med rec  Assessment: 69 y.o. male with a history of T2DM, HTN, HLD, GERD, and recent hospitalization for covid-19 pneumonia (discharged 9/3 on 6LPM O2; completed remdesivir infusions as outpatient 9/5). Patient found to have acute bilateral DVTs with concern for PE but no definitive PE was noted on imaging. Eliquis initiated at previous admission  And continued treatment as outpatient for DVT.(discharged 9/13  The patient presented today with increasing SOB. Patients last dose of eliquis was 9/17 at 1900. Pharmacy asked to transition to heparin.   Goal of Therapy:  Heparin level 0.3-0.7  units/ml aPTT 66-102 seconds Monitor platelets by anticoagulation protocol: Yes   Plan:  Start heparin infusion at 1400 units/hr  With no bolus Check baseline APTT and HL, then check APTT and anti-Xa level in ~6-8 hours and daily while on heparin Continue to monitor H&H and platelets  Isac Sarna, BS [place], BCPS Clinical Pharmacist Pager [phone]  08/19/2020,12:42 PM

## 2020-08-19 NOTE — ED Provider Notes (Addendum)
Patient's original chest tube clotted off.  And patient had reaccumulation of the pneumothorax.  Patient sats did drop down to about 87%.  On high flow oxygen nasal cannula.    Patient was prepped and draped to place a 36 French chest tube.  Concern was since the other one had clotted off with blood that may be there was some bleeding.  When the new chest tube got placed did show evidence of actually clear fluid or serosanguineous type fluid.  So not a large amount of blood or any concerns for significant bleeding.  During the procedure patient's lowest oxygen sats were 84%.  That was after the removal of the previous chest tube and when making the chest wall opening to place in the 36 French chest tube.  Postprocedure chest x-ray showed almost near inflation of the lung.  The tube was in the lateral fissure.  So not ideal position.  But did reuse the previous chest tube tract somewhat.  Then could not get the tube to get up into the apex.  Was inserted far enough.  The last hole is within the chest cavity.  Patient tolerated the procedure well.   CHEST TUBE INSERTION  Date/Time: 08/19/2020 5:33 PM Performed by: Fredia Sorrow, MD Authorized by: Fredia Sorrow, MD   Consent:    Consent obtained:  Verbal and emergent situation   Consent given by:  Patient   Risks discussed:  Bleeding, incomplete drainage, infection, pain and nerve damage Pre-procedure details:    Skin preparation:  Betadine   Preparation: Patient was prepped and draped in the usual sterile fashion   Anesthesia (see MAR for exact dosages):    Anesthesia method:  Local infiltration   Local anesthetic:  Lidocaine 1% w/o epi Procedure details:    Placement location:  L lateral   Tube size (Fr):  36   Dissection instrument:  Finger, Kelly clamp and scissors   Ultrasound guidance: no     Tension pneumothorax: no     Tube connected to:  Water seal   Drainage characteristics:  Serosanguinous   Suture material:  2-0  silk   Dressing:  Xeroform gauze Post-procedure details:    Post-insertion x-ray findings: tube in good position     Patient tolerance of procedure:  Tolerated well, no immediate complications  [facility] is here to transport patient.  With the Pleur-evac we had good bubbling.  Good suction.   Fredia Sorrow, MD 08/19/20 1737    Fredia Sorrow, MD 08/19/20 1738   Addendum  Now in retrospect the chest x-ray that was done shortly before I went in to redo the procedure because the patient was desaturating and having more respiratory problems.  That chest x-ray shows that the original chest tube must of moved itself out and it was no longer in the long.  That probably explains where the problem is.  And patient now with the 79 French chest tube doing much better.  And much more comfortable oxygen wise.  In addition when the previous chest tube suture was cut and it was pulled out it appeared that it was maybe only in it past may be 3 to 4 cm.  But initially thought maybe it is just slid out there at the last minute but as stated above the chest x-ray showed that it was already out and that was the patient's problem.  Repeat chest x-ray with the big chest tube is formal results and reading is pending.  But to my view the chest  tube was adequate placement and had reinflated the the lung.    Fredia Sorrow, MD 08/19/20 1747    Fredia Sorrow, MD 08/19/20 [phone]

## 2020-08-19 NOTE — ED Notes (Signed)
Per Dr. Rogene Houston, prepare for new chest tube insertion at this time.

## 2020-08-19 NOTE — Progress Notes (Signed)
PCCM interval progress note:   Pt evaluated after admission to the ICU. L chest tube tidaling, insertion site looks good and is sutured appropriately.  Pt awake and oriented, sats currently 90-95% on 50L 100% HFNC.   He appears generally fatigued, but not in respiratory distress at this time.  He remains at high risk for intubation but is stable currently.   Otilio Carpen Tye Vigo, PA-C

## 2020-08-19 NOTE — ED Notes (Signed)
RT at bedside at this time.

## 2020-08-19 NOTE — ED Notes (Signed)
Time out was performed at 0922 by Dr. Reather Converse with Fabio Neighbors RN, Celedonio Savage RN and Dr. Posey Pronto present and as witnesses
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
## 2020-08-19 NOTE — ED Notes (Signed)
ED Provider made aware of patient's chest tube concerns. Per MD, attempt to readjust externally if possible due to positional tube. This RN verbalized understanding and expressed concerns of further assessment needed. Per MD, attempt readjustment for position and notify of changes. This RN verbalized understanding.

## 2020-08-19 NOTE — ED Triage Notes (Addendum)
Pt dx with covid in end August and was hospitalized . Pt with increase SOB this morning and sats in 80's. NRB at 15 L 88%. Pt has been on steroids and using nebs. Pt discharged from hospital Monday

## 2020-08-19 NOTE — ED Notes (Addendum)
While at bedside, this RN notes that patient's chest tube to suction is without tidaling . Pt denies worsening shortness of breath, but appears uncomfortable at this time.  This RN notes subcutaneous crepitus surrounding chest tube and insertion site.  Pt's SpO2 remains in 90-94% on heated high flow.  This RN remains at bedside at this time.

## 2020-08-19 NOTE — ED Notes (Addendum)
Dr. Rogene Houston and Admitting providers made aware that while at bedside, this RN notes absent breath sounds to the left side with worsening crepitus on the left anterior chest wall. New orders obtained for repeat chest x-ray at this time. Pt status appears unchanged, SpO2 maintaining around mid to high 90s on heated high flow. Will continue close observation.

## 2020-08-19 NOTE — ED Notes (Addendum)
MD at bedside for new chest tube insertion at this time.   5:12 PM 36FR chest tube inserted at this time. Dressed with Xeroform gauze, foam tape. Dressing secured and placed to suction at this time. This RN and staff note tidaling in the chamber. Pt reports an improvement in work of breathing and shortness of breath. Continuous monitoring in place at this time. Staff remains at bedside, post line x-ray ordered at this time.

## 2020-08-20 ENCOUNTER — Inpatient Hospital Stay (HOSPITAL_COMMUNITY): Payer: 59

## 2020-08-20 DIAGNOSIS — E119 Type 2 diabetes mellitus without complications: Secondary | ICD-10-CM

## 2020-08-20 DIAGNOSIS — L8990 Pressure ulcer of unspecified site, unspecified stage: Secondary | ICD-10-CM | POA: Insufficient documentation

## 2020-08-20 LAB — BASIC METABOLIC PANEL
Anion gap: 11 (ref 5–15)
BUN: 52 mg/dL — ABNORMAL HIGH (ref 8–23)
CO2: 23 mmol/L (ref 22–32)
Calcium: 8 mg/dL — ABNORMAL LOW (ref 8.9–10.3)
Chloride: 103 mmol/L (ref 98–111)
Creatinine, Ser: 1.33 mg/dL — ABNORMAL HIGH (ref 0.61–1.24)
GFR calc Af Amer: >60 mL/min (ref >60)
GFR calc non Af Amer: 55 mL/min — ABNORMAL LOW (ref >60)
Glucose, Bld: 311 mg/dL — ABNORMAL HIGH (ref 70–99)
Potassium: 4.3 mmol/L (ref 3.5–5.1)
Sodium: 137 mmol/L (ref 135–145)

## 2020-08-20 LAB — POCT I-STAT 7, (LYTES, BLD GAS, ICA,H+H)
Acid-base deficit: 1 mmol/L (ref 0.0–2.0)
Acid-base deficit: 2 mmol/L (ref 0.0–2.0)
Bicarbonate: 21.3 mmol/L (ref 20.0–28.0)
Bicarbonate: 25.1 mmol/L (ref 20.0–28.0)
Calcium, Ion: 1.18 mmol/L (ref 1.15–1.40)
Calcium, Ion: 1.15 mmol/L (ref 1.15–1.40)
HCT: 44 % (ref 39.0–52.0)
HCT: 43 % (ref 39.0–52.0)
Hemoglobin: 15 g/dL (ref 13.0–17.0)
Hemoglobin: 14.6 g/dL (ref 13.0–17.0)
O2 Saturation: 93 %
O2 Saturation: 97 %
Patient temperature: 96.4
Patient temperature: 98.4
Potassium: 3.9 mmol/L (ref 3.5–5.1)
Potassium: 4.7 mmol/L (ref 3.5–5.1)
Sodium: 137 mmol/L (ref 135–145)
Sodium: 136 mmol/L (ref 135–145)
TCO2: 22 mmol/L (ref 22–32)
TCO2: 27 mmol/L (ref 22–32)
pCO2 arterial: 27.1 mmHg — ABNORMAL LOW (ref 32.0–48.0)
pCO2 arterial: 52.5 mmHg — ABNORMAL HIGH (ref 32.0–48.0)
pH, Arterial: 7.499 — ABNORMAL HIGH (ref 7.350–7.450)
pH, Arterial: 7.287 — ABNORMAL LOW (ref 7.350–7.450)
pO2, Arterial: 57 mmHg — ABNORMAL LOW (ref 83.0–108.0)
pO2, Arterial: 101 mmHg (ref 83.0–108.0)

## 2020-08-20 LAB — GLUCOSE, CAPILLARY
Glucose-Capillary: 181 mg/dL — ABNORMAL HIGH (ref 70–99)
Glucose-Capillary: 189 mg/dL — ABNORMAL HIGH (ref 70–99)
Glucose-Capillary: 309 mg/dL — ABNORMAL HIGH (ref 70–99)
Glucose-Capillary: 309 mg/dL — ABNORMAL HIGH (ref 70–99)
Glucose-Capillary: 335 mg/dL — ABNORMAL HIGH (ref 70–99)

## 2020-08-20 LAB — CBC
HCT: 44.3 % (ref 39.0–52.0)
Hemoglobin: 14.9 g/dL (ref 13.0–17.0)
MCH: 29.6 pg (ref 26.0–34.0)
MCHC: 33.6 g/dL (ref 30.0–36.0)
MCV: 88.1 fL (ref 80.0–100.0)
Platelets: 386 10*3/uL (ref 150–400)
RBC: 5.03 MIL/uL (ref 4.22–5.81)
RDW: 13.6 % (ref 11.5–15.5)
WBC: 20.3 10*3/uL — ABNORMAL HIGH (ref 4.0–10.5)
nRBC: 0 % (ref 0.0–0.2)

## 2020-08-20 LAB — D-DIMER, QUANTITATIVE: D-Dimer, Quant: 3.97 ug/mL-FEU — ABNORMAL HIGH (ref 0.00–0.50)

## 2020-08-20 LAB — PROCALCITONIN: Procalcitonin: 0.36 ng/mL

## 2020-08-20 LAB — APTT: aPTT: 22 seconds — ABNORMAL LOW (ref 24–36)

## 2020-08-20 LAB — PHOSPHORUS: Phosphorus: 6.4 mg/dL — ABNORMAL HIGH (ref 2.5–4.6)

## 2020-08-20 LAB — MAGNESIUM: Magnesium: 2.8 mg/dL — ABNORMAL HIGH (ref 1.7–2.4)

## 2020-08-20 LAB — HEPARIN LEVEL (UNFRACTIONATED): Heparin Unfractionated: 0.68 IU/mL (ref 0.30–0.70)

## 2020-08-20 MED ORDER — FENTANYL CITRATE (PF) 100 MCG/2ML IJ SOLN
INTRAMUSCULAR | Status: AC
  Administered 2020-08-20: 100 ug via INTRAVENOUS
  Filled 2020-08-20: qty 2

## 2020-08-20 MED ORDER — ONDANSETRON HCL 4 MG PO TABS: 4.0000 mg | ORAL_TABLET | Freq: Four times a day (QID) | ORAL | Status: DC | PRN

## 2020-08-20 MED ORDER — BARICITINIB 2 MG PO TABS
4.0000 mg | ORAL_TABLET | Freq: Every day | ORAL | Status: DC
  Administered 2020-08-21 – 2020-08-24 (×4): 4 mg
  Filled 2020-08-20 (×4): qty 2

## 2020-08-20 MED ORDER — MIDAZOLAM HCL 2 MG/2ML IJ SOLN
1.0000 mg | INTRAMUSCULAR | Status: AC | PRN
  Administered 2020-08-20 (×3): 1 mg via INTRAVENOUS
  Filled 2020-08-20 (×3): qty 2

## 2020-08-20 MED ORDER — ORAL CARE MOUTH RINSE
15.0000 mL | OROMUCOSAL | Status: DC
  Administered 2020-08-20 – 2020-08-24 (×33): 15 mL via OROMUCOSAL

## 2020-08-20 MED ORDER — DOCUSATE SODIUM 50 MG/5ML PO LIQD
100.0000 mg | Freq: Two times a day (BID) | ORAL | Status: DC
  Administered 2020-08-21 – 2020-08-24 (×4): 100 mg
  Filled 2020-08-20 (×5): qty 10

## 2020-08-20 MED ORDER — DOCUSATE SODIUM 50 MG/5ML PO LIQD: 100.0000 mg | Freq: Two times a day (BID) | ORAL | Status: DC | PRN

## 2020-08-20 MED ORDER — MIDAZOLAM HCL 2 MG/2ML IJ SOLN
INTRAMUSCULAR | Status: AC
  Administered 2020-08-20: 2 mg
  Filled 2020-08-20: qty 4

## 2020-08-20 MED ORDER — ONDANSETRON HCL 4 MG/2ML IJ SOLN: 4.0000 mg | Freq: Four times a day (QID) | INTRAMUSCULAR | Status: DC | PRN

## 2020-08-20 MED ORDER — ACETAMINOPHEN 325 MG PO TABS: 650.0000 mg | ORAL_TABLET | Freq: Four times a day (QID) | ORAL | Status: DC | PRN

## 2020-08-20 MED ORDER — MIDAZOLAM HCL 2 MG/2ML IJ SOLN
1.0000 mg | INTRAMUSCULAR | Status: DC | PRN
  Administered 2020-08-20: 1 mg via INTRAVENOUS
  Filled 2020-08-20: qty 2

## 2020-08-20 MED ORDER — POLYETHYLENE GLYCOL 3350 17 G PO PACK
17.0000 g | PACK | Freq: Every day | ORAL | Status: DC
  Administered 2020-08-20: 17 g via ORAL
  Filled 2020-08-20: qty 1

## 2020-08-20 MED ORDER — POLYETHYLENE GLYCOL 3350 17 G PO PACK
17.0000 g | PACK | Freq: Every day | ORAL | Status: DC
  Administered 2020-08-21: 17 g
  Filled 2020-08-20: qty 1

## 2020-08-20 MED ORDER — CHLORHEXIDINE GLUCONATE 0.12% ORAL RINSE (MEDLINE KIT)
15.0000 mL | Freq: Two times a day (BID) | OROMUCOSAL | Status: DC
  Administered 2020-08-20 – 2020-08-23 (×8): 15 mL via OROMUCOSAL

## 2020-08-20 MED ORDER — PROSOURCE TF PO LIQD
45.0000 mL | Freq: Two times a day (BID) | ORAL | Status: DC
  Administered 2020-08-20 – 2020-08-21 (×3): 45 mL
  Filled 2020-08-20 (×3): qty 45

## 2020-08-20 MED ORDER — METHYLPREDNISOLONE SODIUM SUCC 125 MG IJ SOLR
80.0000 mg | Freq: Two times a day (BID) | INTRAMUSCULAR | Status: AC
  Administered 2020-08-21 – 2020-08-22 (×3): 80 mg via INTRAVENOUS
  Filled 2020-08-20 (×3): qty 2

## 2020-08-20 MED ORDER — PREDNISONE 10 MG PO TABS
50.0000 mg | ORAL_TABLET | Freq: Every day | ORAL | Status: DC
  Administered 2020-08-22 – 2020-08-24 (×3): 50 mg
  Filled 2020-08-20: qty 1
  Filled 2020-08-20: qty 5
  Filled 2020-08-20: qty 1

## 2020-08-20 MED ORDER — LINAGLIPTIN 5 MG PO TABS
5.0000 mg | ORAL_TABLET | Freq: Every day | ORAL | Status: DC
  Administered 2020-08-21 – 2020-08-24 (×4): 5 mg
  Filled 2020-08-20 (×4): qty 1

## 2020-08-20 MED ORDER — POLYETHYLENE GLYCOL 3350 17 G PO PACK: 17.0000 g | PACK | Freq: Every day | ORAL | Status: DC | PRN

## 2020-08-20 MED ORDER — "THROMBI-PAD 3""X3"" EX PADS"
1.0000 | MEDICATED_PAD | CUTANEOUS | Status: DC | PRN
  Filled 2020-08-20: qty 1

## 2020-08-20 MED ORDER — ALTEPLASE 2 MG IJ SOLR
4.0000 mg | Freq: Once | INTRAMUSCULAR | Status: AC
  Administered 2020-08-20: 4 mg

## 2020-08-20 MED ORDER — FENTANYL 2500MCG IN NS 250ML (10MCG/ML) PREMIX INFUSION
0.0000 ug/h | INTRAVENOUS | Status: DC
  Administered 2020-08-20: 300 ug/h via INTRAVENOUS
  Administered 2020-08-20: 100 ug/h via INTRAVENOUS
  Administered 2020-08-21: 250 ug/h via INTRAVENOUS
  Filled 2020-08-20 (×3): qty 250

## 2020-08-20 MED ORDER — FREE WATER
200.0000 mL | Status: DC
  Administered 2020-08-20 – 2020-08-21 (×7): 200 mL

## 2020-08-20 MED ORDER — NOREPINEPHRINE 4 MG/250ML-% IV SOLN
INTRAVENOUS | Status: AC
  Administered 2020-08-20: 4 ug/min via INTRAVENOUS
  Filled 2020-08-20: qty 250

## 2020-08-20 MED ORDER — FENTANYL CITRATE (PF) 100 MCG/2ML IJ SOLN: 25.0000 ug | Freq: Once | INTRAMUSCULAR | Status: AC

## 2020-08-20 MED ORDER — VITAL HIGH PROTEIN PO LIQD
1000.0000 mL | ORAL | Status: DC
  Administered 2020-08-20: 1000 mL

## 2020-08-20 MED ORDER — DOCUSATE SODIUM 50 MG/5ML PO LIQD
100.0000 mg | Freq: Two times a day (BID) | ORAL | Status: DC
  Administered 2020-08-20 (×2): 100 mg via ORAL
  Filled 2020-08-20 (×2): qty 10

## 2020-08-20 MED ORDER — FAMOTIDINE IN NACL 20-0.9 MG/50ML-% IV SOLN
20.0000 mg | INTRAVENOUS | Status: DC
  Filled 2020-08-20: qty 50

## 2020-08-20 MED ORDER — SODIUM CHLORIDE 0.9% FLUSH
10.0000 mL | Freq: Three times a day (TID) | INTRAVENOUS | Status: DC
  Administered 2020-08-20 – 2020-08-23 (×8): 10 mL

## 2020-08-20 MED ORDER — INSULIN ASPART 100 UNIT/ML ~~LOC~~ SOLN
0.0000 [IU] | SUBCUTANEOUS | Status: DC
  Administered 2020-08-20 (×2): 15 [IU] via SUBCUTANEOUS
  Administered 2020-08-20 – 2020-08-21 (×2): 11 [IU] via SUBCUTANEOUS
  Administered 2020-08-21: 7 [IU] via SUBCUTANEOUS
  Administered 2020-08-21: 11 [IU] via SUBCUTANEOUS
  Administered 2020-08-21: 7 [IU] via SUBCUTANEOUS
  Administered 2020-08-22: 4 [IU] via SUBCUTANEOUS
  Administered 2020-08-22: 3 [IU] via SUBCUTANEOUS
  Administered 2020-08-22: 7 [IU] via SUBCUTANEOUS
  Administered 2020-08-22 (×2): 4 [IU] via SUBCUTANEOUS
  Administered 2020-08-23: 20 [IU] via SUBCUTANEOUS
  Administered 2020-08-23: 15 [IU] via SUBCUTANEOUS
  Administered 2020-08-23: 4 [IU] via SUBCUTANEOUS
  Administered 2020-08-23: 15 [IU] via SUBCUTANEOUS
  Administered 2020-08-24: 4 [IU] via SUBCUTANEOUS
  Administered 2020-08-24: 11 [IU] via SUBCUTANEOUS
  Administered 2020-08-24: 4 [IU] via SUBCUTANEOUS
  Administered 2020-08-24: 3 [IU] via SUBCUTANEOUS
  Administered 2020-08-25: 15 [IU] via SUBCUTANEOUS
  Administered 2020-08-25: 7 [IU] via SUBCUTANEOUS
  Administered 2020-08-25: 11 [IU] via SUBCUTANEOUS
  Administered 2020-08-25: 4 [IU] via SUBCUTANEOUS
  Administered 2020-08-26: 3 [IU] via SUBCUTANEOUS
  Administered 2020-08-26 (×2): 7 [IU] via SUBCUTANEOUS
  Administered 2020-08-26: 15 [IU] via SUBCUTANEOUS
  Administered 2020-08-26: 4 [IU] via SUBCUTANEOUS
  Administered 2020-08-27: 3 [IU] via SUBCUTANEOUS
  Administered 2020-08-27: 4 [IU] via SUBCUTANEOUS
  Administered 2020-08-27: 7 [IU] via SUBCUTANEOUS
  Administered 2020-08-27 – 2020-08-28 (×4): 11 [IU] via SUBCUTANEOUS
  Administered 2020-08-28: 15 [IU] via SUBCUTANEOUS
  Administered 2020-08-28: 4 [IU] via SUBCUTANEOUS
  Administered 2020-08-28: 11 [IU] via SUBCUTANEOUS
  Administered 2020-08-29 (×2): 15 [IU] via SUBCUTANEOUS
  Administered 2020-08-29: 7 [IU] via SUBCUTANEOUS
  Administered 2020-08-29: 4 [IU] via SUBCUTANEOUS
  Administered 2020-08-29: 11 [IU] via SUBCUTANEOUS
  Administered 2020-08-29: 15 [IU] via SUBCUTANEOUS
  Administered 2020-08-29: 11 [IU] via SUBCUTANEOUS
  Administered 2020-08-30: 4 [IU] via SUBCUTANEOUS

## 2020-08-20 MED ORDER — FENTANYL BOLUS VIA INFUSION
25.0000 ug | INTRAVENOUS | Status: DC | PRN
  Filled 2020-08-20: qty 25

## 2020-08-20 NOTE — Progress Notes (Signed)
[facility]   Intubated, CVL placed  Left pneumothorax now much larger, 32 fr left chest tube continues to fill with blood and require frequent manipulation.   Will place additional left side pigtail chest tube  Roselie Awkward, MD [place] [facility] Pager: [phone] Cell: [phone] If no response, call [phone]

## 2020-08-20 NOTE — Progress Notes (Addendum)
ANTICOAGULATION CONSULT NOTE - Initial Consult  Pharmacy Consult for Heparin Indication: VTE treatment  Allergies  Allergen Reactions  . Eggs Or Egg-Derived Products Hives    Patient Measurements: Height: 5\' 10"  (177.8 cm) Weight: 78.6 kg (173 lb 4.5 oz) IBW/kg (Calculated) : 73 HEPARIN DW (KG): 84  Vital Signs: Temp: 98.4 F (36.9 C) (09/19 1208) Temp Source: Oral (09/19 1208) BP: 103/76 (09/19 0700) Pulse Rate: 128 (09/19 0856)  Labs: Recent Labs    08/19/20 0730 08/19/20 0730 08/19/20 1259 08/19/20 1538 08/19/20 1954 08/19/20 1954 08/20/20 0442 08/20/20 0442 08/20/20 0500 08/20/20 0709 08/20/20 0710 08/20/20 1236  HGB 17.7*   < >  --   --  16.9   < > 15.0   < > 14.9  --   --  14.6  HCT 50.9   < >  --   --  49.8   < > 44.0  --  44.3  --   --  43.0  PLT 391  --   --   --  267  --   --   --  386  --   --   --   APTT  --   --  28  --   --   --   --   --   --  22*  --   --   HEPARINUNFRC  --   --   --  1.42*  --   --   --   --   --   --  0.68  --   CREATININE 1.07  --   --   --  1.02  --   --   --  1.33*  --   --   --    < > = values in this interval not displayed.    Estimated Creatinine Clearance: 55.6 mL/min (A) (by C-G formula based on SCr of 1.33 mg/dL (H)).   Medical History: Past Medical History:  Diagnosis Date  . Acute respiratory disease   . Atypical mole 12/30/2012   severe left post shoulder tx exc  . Candidiasis of urogenital sites   . Diabetes mellitus   . Diffuse myofascial pain syndrome   . Esophageal reflux   . Hypertension   . Impacted cerumen of right ear   . Melanoma ([place]) 06/14/2011   left ear mohs  . Mixed hyperlipidemia   . MM (malignant melanoma of skin) ([place]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  . Primary insomnia   . Seborrheic dermatitis, unspecified   . Squamous cell carcinoma of skin 06/14/2011   left forearm medial cx3 22fu  . Thrombocytopenia, unspecified ([place])     Assessment: 69 y.o. male with a history of  T2DM, HTN, HLD, GERD, and recent hospitalization for covid-19 pneumonia (discharged 9/3 on 6LPM O2; completed remdesivir infusions as outpatient 9/5). Patient found to have acute bilateral DVTs with concern for PE but no definitive PE was noted on imaging. Eliquis initiated at previous admission  And continued treatment as outpatient for DVT.(discharged 9/13  The patient presented today with increasing SOB. Patients last dose of eliquis was 9/17 at 1900. Pharmacy asked to transition to heparin.   aPTT 22 which is subtherapeutic; HL 0.68 which likely still reflects Eliquis dosing.  Will continue to monitor aPTT and HL until they correlate more closely.  Goal of Therapy:  Heparin level 0.3-0.7 units/ml aPTT 66-102 seconds Monitor platelets by anticoagulation protocol: Yes   Plan:  Start heparin infusion at 1650 units/hr  With no bolus Check APTT in ~6-8 hours daily monitor APTT and HL while on heparin Continue to monitor H&H and platelets  Alanda Slim, PharmD, [facility] Clinical Pharmacist Please see AMION for all Pharmacists' Contact Phone Numbers 08/20/2020, 1:16 PM   ADDENDUM:  Nurse called and stated that heparin was stopped at 0900 today due to bleeding from the chest tube.  Therefore, aPTT above not reflective of heparin infusion.  Will start infusion back a previous rate and recheck aPTT in 6 - 8 hrs.  Plan: Start heparin infusion at 1400 units/hr  With no bolus Check APTT in ~6-8 hours  Alanda Slim, PharmD, [facility] Clinical Pharmacist Please see AMION for all Pharmacists' Contact Phone Numbers 08/20/2020, 2:50 PM

## 2020-08-20 NOTE — Progress Notes (Signed)
Brief Nutrition Note RD working remotely.   Consult received for enteral/tube feeding initiation and management. No tube for TF present per review of LDA avatar. Patient is NPO.   Adult Enteral Nutrition Protocol initiated. Full assessment to follow.  Admitting Dx: Hyponatremia [E87.1] Pneumothorax, left [J93.9] Acute respiratory failure with hypoxia ([place]) [J96.01] HCAP (healthcare-associated pneumonia) [J18.9] Pneumothorax [J93.9] Chest tube in place [Z96.89] EYCXK-48 virus infection [U07.1]  Body mass index is 24.86 kg/m. Pt meets criteria for normal weight based on current BMI.  Labs:  Recent Labs  Lab 08/14/20 0425 08/19/20 0730 08/19/20 1954 08/20/20 0442  NA 131* 131*  --  137  K 4.4 4.3  --  3.9  CL 100 96*  --   --   CO2 19* 21*  --   --   BUN 30* 40*  --   --   CREATININE 0.97 1.07 1.02  --   CALCIUM 8.3* 8.6*  --   --   GLUCOSE 240* 292*  --   --        Jarome Matin, MS, RD, LDN, CNSC Inpatient Clinical Dietitian RD pager # available in AMION  After hours/weekend pager # available in [facility]

## 2020-08-20 NOTE — Progress Notes (Signed)
Patient intubated by MD with RT and RN at bedside. Patient has 7.5 ETT, 24 cm at lip. Placed on ventilator on 6cc. Chest Xray pending. RT will continue to monitor.

## 2020-08-20 NOTE — Procedures (Signed)
Insertion of Chest Tube Procedure Note  Tim Day  [id number]  1951/05/26  Date:08/20/20  Time:1:06 PM    Provider Performing: Roselie Awkward   Procedure: Chest Tube Insertion [phone])  Indication(s) Pneumothorax  Consent Risks of the procedure as well as the alternatives and risks of each were explained to the patient and/or caregiver.  Consent for the procedure was obtained and is signed in the bedside chart  Anesthesia Topical only with 1% lidocaine    Time Out Verified patient identification, verified procedure, site/side was marked, verified correct patient position, special equipment/implants available, medications/allergies/relevant history reviewed, required imaging and test results available.   Sterile Technique Maximal sterile technique including full sterile barrier drape, hand hygiene, sterile gown, sterile gloves, mask, hair covering, sterile ultrasound probe cover (if used).   Procedure Description Ultrasound used to identify appropriate pleural anatomy for placement and overlying skin marked. Area of placement cleaned and draped in sterile fashion.  A 14 French pigtail pleural catheter was placed into the left pleural space using Seldinger technique. Appropriate return of air was obtained.  The tube was connected to atrium and placed on -20 cm H2O wall suction.   Complications/Tolerance None; patient tolerated the procedure well. Chest X-ray is ordered to verify placement.   EBL Minimal  Specimen(s) none   Roselie Awkward, MD [place] P[facility]M Pager: [phone] Cell: [phone] If no response, call [phone]

## 2020-08-20 NOTE — Progress Notes (Signed)
Bladder scanned at 1624 resulting in 617ml of urine. Order to place foley catheter from MD.   Catheter attempted at bedside; unable to advanced catheter due to pt's anatomy. Dr Lake Bells aware and will consult Urology.
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
## 2020-08-20 NOTE — Procedures (Addendum)
Intubation Procedure Note Tim Day [id number] 06-13-1951  Procedure: Intubation Indications: Airway protection and maintenance  Procedure Details Consent: Risks of procedure as well as the alternatives and risks of each were explained to the (patient/caregiver).  Consent for procedure obtained. Time Out: Verified patient identification, verified procedure, site/side was marked, verified correct patient position, special equipment/implants available, medications/allergies/relevent history reviewed, required imaging and test results available.  Performed  Drugs Versed 66m IV, Fentanyl 569m IV, Etomidate 2062mV, Rocuronium 43m11m DL x 1 with MAC 4 blade Grade 1 view 7.5 ET tube passed through cords under direct visualization Placement confirmed with bilateral breath sounds, positive EtCO2 change and smoke in tube   Evaluation Hemodynamic Status: Transient hypotension treated with pressors and fluid; O2 sats: stable throughout Patient's Current Condition: stable Complications: No apparent complications Patient did tolerate procedure well. Chest X-ray ordered to verify placement.  CXR: pending.   BrenRoselie Awkward9/2021

## 2020-08-20 NOTE — Procedures (Signed)
Central Venous Catheter Insertion Procedure Note Tim Day [id number] 1951-08-21  Procedure: Insertion of Central Venous Catheter Indications: Assessment of intravascular volume  Procedure Details Consent: Unable to obtain consent because of emergent medical necessity. Time Out: Verified patient identification, verified procedure, site/side was marked, verified correct patient position, special equipment/implants available, medications/allergies/relevent history reviewed, required imaging and test results available.  Performed  Maximum sterile technique was used including antiseptics, cap, gloves, gown, hand hygiene, mask and sheet. Skin prep: Chlorhexidine; local anesthetic administered A antimicrobial bonded/coated triple lumen catheter was placed in the left subclavian vein using the Seldinger technique.  Ultrasound was used to verify the patency of the vein and for real time needle guidance.  Evaluation Blood flow good Complications: No apparent complications Patient did tolerate procedure well. Chest X-ray ordered to verify placement.  CXR: pending.  Roselie Awkward 08/20/2020, 9:41 AM

## 2020-08-20 NOTE — Progress Notes (Addendum)
PCCM progress note:  Asked to re-evaluate pt as respiratory status worsened and there was bleeding around the insertion site, CXR shows worsening L pneumo and there was blood throughout chest tube without tidaling or air leak.   The connector was disengaged and there clot to the chest wall.  Some of this was manually evacuated, but could not reach more proximal portions, tried flushing with saline unsuccessfully.  Instilled 4mg  cathflo into the tubing for a few minutes with manual tube compression which successfully broke up most of the clot.  Repeat CXR improved and O2 sats 90-92%, now tidaling.  This was discussed with Dr. Earlie Server and with RN to monitor closely for loss of tidaling and repeat clotting.    Tim Carpen [place] Kosch, PA-C

## 2020-08-20 NOTE — Progress Notes (Signed)
eLink Physician-Brief Progress Note Patient Name: Tim Day DOB: 06-17-1951 MRN: [id number]   Date of Service  08/20/2020  HPI/Events of Note  Increased WOB and anxiety - Sat = 89% and RR = 36. Nursing requests evaluation by ground team.  eICU Interventions  Plan: 1. Will request that ground team evaluate the patient at bedside.      Intervention Category Major Interventions: Hypoxemia - evaluation and management  Luann Aspinwall Eugene 08/20/2020, 1:09 AM

## 2020-08-20 NOTE — Progress Notes (Signed)
NAME:  Tim Day, MRN:  [id number], DOB:  [birth year], LOS: 1 ADMISSION DATE:  08/19/2020, CONSULTATION DATE:  9/19 REFERRING MD:  Roderic Palau, CHIEF COMPLAINT:  Dyspnea   Brief History   69 year old with hx of recent COVID diagnosis presenting with worsening hypoxemic resp failure and left pneumothorax.  Past Medical History  GERD HTN DM2 A1c 7.2%  Significant Hospital Events   9/1 treated for COVID, noted to have bilateral DVT, d/c home 9/13 on O2, returned with pneumothorax 9/18 [facility] to [facility], chest tube exchanged prior to transfer; chest tube occluded, required maniuplation.  Consults:  n/a  Procedures:  9/18 Left chest tube 30 Fr  Significant Diagnostic Tests:    Micro Data:  8/25 SARS cov 2 > positive 9/1 SARS cov 2 > positive 9/18 SARS cov 2> positive  9/18 Blood cx x 2>>  Antimicrobials:  9/1 remdesivir >  9/7 baricitinib > 9/13, 9/18 >   Interim history/subjective:  Increased work of breathing  Slept some after chest tube was manipulated last night, but now he is feeling worse again  Objective   Blood pressure 101/84, pulse (!) 107, temperature (!) 96.4 F (35.8 C), temperature source Axillary, resp. rate (!) 24, height 5\' 10"  (1.778 m), weight 78.6 kg, SpO2 95 %.    Vent Mode: BIPAP FiO2 (%):  [100 %] 100 % Set Rate:  [12 bmp] 12 bmp PEEP:  [5 cmH20] 5 cmH20   Intake/Output Summary (Last 24 hours) at 08/20/2020 7673 Last data filed at 08/20/2020 0600 Gross per 24 hour  Intake 570.99 ml  Output 1100 ml  Net -529.01 ml   Filed Weights   08/19/20 0723 08/20/20 0500  Weight: 84 kg 78.6 kg    Examination:  General:  Increased work of breathing in bed HENT: NCAT OP clear PULM: Diminished on L, clear on right, increased effort with accessory muscle use CV: tachy, no mgr GI: BS+, soft, nontender MSK: normal bulk and tone Neuro: awake, alert, oriented, MAEW  9/19 CXR images personally reviewed> Chest tube in place, small pneumothorax on left     Resolved Hospital Problem list     Assessment & Plan:  Acute hypoxemic respiratory failure due to COVID ARDS > worsening work of breathing  Intubate now Continue mechanical ventilation per ARDS protocol Target TVol 6-8cc/kgIBW Target Plateau Pressure < 30cm H20 Target driving pressure less than 15 cm of water Target PaO2 55-65: titrate PEEP/FiO2 per protocol As long as PaO2 to FiO2 ratio is less than 1:150 position in prone position for 16 hours a day Check CVP daily if CVL in place Target CVP less than 4, diurese as necessary Ventilator associated pneumonia prevention protocol  Left pneumothorax, chest tube clotting with blood Hold heparin for now Repeat CXR after intubation May need to change chest tube  DM2 with hyperglycemia- Levemir, SSI  BL LE DVT Resume heparin infusion after assessing whether or not he needs a new chest tube  Best practice:  Diet: start tube feeding Pain/Anxiety/Delirium protocol (if indicated): PAD protocol, fentanyl infusion, RASS -2 VAP protocol (if indicated): yes DVT prophylaxis: heparin in fusion GI prophylaxis: famotidine Glucose control: SSI Mobility: bed rest Code Status: full Family Communication: I updated his wife by phone on 9/19 Disposition:   Labs   CBC: Recent Labs  Lab 08/14/20 0425 08/19/20 0730 08/19/20 1954 08/20/20 0442  WBC 12.7* 19.0* 12.6*  --   NEUTROABS  --  16.9*  --   --   HGB 16.4 17.7* 16.9  15.0  HCT 47.4 50.9 49.8 44.0  MCV 86.8 85.3 85.0  --   PLT 283 391 267  --     Basic Metabolic Panel: Recent Labs  Lab 08/14/20 0425 08/19/20 0730 08/19/20 1954 08/20/20 0442  NA 131* 131*  --  137  K 4.4 4.3  --  3.9  CL 100 96*  --   --   CO2 19* 21*  --   --   GLUCOSE 240* 292*  --   --   BUN 30* 40*  --   --   CREATININE 0.97 1.07 1.02  --   CALCIUM 8.3* 8.6*  --   --    GFR: Estimated Creatinine Clearance: 72.6 mL/min (by C-G formula based on SCr of 1.02 mg/dL). Recent Labs  Lab  08/14/20 0425 08/19/20 0730 08/19/20 1010 08/19/20 1016 08/19/20 1213 08/19/20 1954  PROCALCITON  --   --   --  0.17  --  0.22  WBC 12.7* 19.0*  --   --   --  12.6*  LATICACIDVEN  --   --  2.6*  --  2.2*  --     Liver Function Tests: Recent Labs  Lab 08/14/20 0425 08/19/20 0730  AST 27 27  ALT 43 36  ALKPHOS 63 81  BILITOT 1.2 1.5*  PROT 6.1* 7.3  ALBUMIN 2.6* 3.1*   No results for input(s): LIPASE, AMYLASE in the last 168 hours. No results for input(s): AMMONIA in the last 168 hours.  ABG    Component Value Date/Time   PHART 7.499 (H) 08/20/2020 0442   PCO2ART 27.1 (L) 08/20/2020 0442   PO2ART 57 (L) 08/20/2020 0442   HCO3 21.3 08/20/2020 0442   TCO2 22 08/20/2020 0442   ACIDBASEDEF 1.0 08/20/2020 0442   O2SAT 93.0 08/20/2020 0442     Coagulation Profile: No results for input(s): INR, PROTIME in the last 168 hours.  Cardiac Enzymes: No results for input(s): CKTOTAL, CKMB, CKMBINDEX, TROPONINI in the last 168 hours.  HbA1C: Hgb A1c MFr Bld  Date/Time Value Ref Range Status  08/08/2020 04:16 PM 7.2 (H) 4.8 - 5.6 % Final    Comment:    (NOTE) Pre diabetes:          5.7%-6.4%  Diabetes:              >6.4%  Glycemic control for   <7.0% adults with diabetes     CBG: Recent Labs  Lab 08/19/20 1525 08/19/20 1827 08/19/20 1931 08/19/20 2347 08/20/20 0334  GLUCAP 297* 327* 317* 229* 181*     Critical care time: 35 minutes     Roselie Awkward, MD [place] [facility] Pager: [phone] Cell: [phone] If no response, call [phone]

## 2020-08-21 ENCOUNTER — Inpatient Hospital Stay (HOSPITAL_COMMUNITY): Payer: 59

## 2020-08-21 DIAGNOSIS — J939 Pneumothorax, unspecified: Secondary | ICD-10-CM

## 2020-08-21 DIAGNOSIS — Z9689 Presence of other specified functional implants: Secondary | ICD-10-CM

## 2020-08-21 DIAGNOSIS — J189 Pneumonia, unspecified organism: Secondary | ICD-10-CM

## 2020-08-21 LAB — CBC WITH DIFFERENTIAL/PLATELET
Abs Immature Granulocytes: 0.23 10*3/uL — ABNORMAL HIGH (ref 0.00–0.07)
Basophils Absolute: 0 10*3/uL (ref 0.0–0.1)
Basophils Relative: 0 %
Eosinophils Absolute: 0 10*3/uL (ref 0.0–0.5)
Eosinophils Relative: 0 %
HCT: 40.4 % (ref 39.0–52.0)
Hemoglobin: 13 g/dL (ref 13.0–17.0)
Immature Granulocytes: 2 %
Lymphocytes Relative: 4 %
Lymphs Abs: 0.6 10*3/uL — ABNORMAL LOW (ref 0.7–4.0)
MCH: 28.5 pg (ref 26.0–34.0)
MCHC: 32.2 g/dL (ref 30.0–36.0)
MCV: 88.6 fL (ref 80.0–100.0)
Monocytes Absolute: 0.8 10*3/uL (ref 0.1–1.0)
Monocytes Relative: 5 %
Neutro Abs: 14 10*3/uL — ABNORMAL HIGH (ref 1.7–7.7)
Neutrophils Relative %: 89 %
Platelets: 342 10*3/uL (ref 150–400)
RBC: 4.56 MIL/uL (ref 4.22–5.81)
RDW: 13.8 % (ref 11.5–15.5)
WBC: 15.7 10*3/uL — ABNORMAL HIGH (ref 4.0–10.5)
nRBC: 0 % (ref 0.0–0.2)

## 2020-08-21 LAB — COMPREHENSIVE METABOLIC PANEL
ALT: 82 U/L — ABNORMAL HIGH (ref 0–44)
AST: 39 U/L (ref 15–41)
Albumin: 1.9 g/dL — ABNORMAL LOW (ref 3.5–5.0)
Alkaline Phosphatase: 70 U/L (ref 38–126)
Anion gap: 10 (ref 5–15)
BUN: 61 mg/dL — ABNORMAL HIGH (ref 8–23)
CO2: 23 mmol/L (ref 22–32)
Calcium: 7.9 mg/dL — ABNORMAL LOW (ref 8.9–10.3)
Chloride: 106 mmol/L (ref 98–111)
Creatinine, Ser: 1.26 mg/dL — ABNORMAL HIGH (ref 0.61–1.24)
GFR calc Af Amer: >60 mL/min (ref >60)
GFR calc non Af Amer: 59 mL/min — ABNORMAL LOW (ref >60)
Glucose, Bld: 278 mg/dL — ABNORMAL HIGH (ref 70–99)
Potassium: 4.6 mmol/L (ref 3.5–5.1)
Sodium: 139 mmol/L (ref 135–145)
Total Bilirubin: 0.8 mg/dL (ref 0.3–1.2)
Total Protein: 5.6 g/dL — ABNORMAL LOW (ref 6.5–8.1)

## 2020-08-21 LAB — GLUCOSE, CAPILLARY
Glucose-Capillary: 129 mg/dL — ABNORMAL HIGH (ref 70–99)
Glucose-Capillary: 130 mg/dL — ABNORMAL HIGH (ref 70–99)
Glucose-Capillary: 216 mg/dL — ABNORMAL HIGH (ref 70–99)
Glucose-Capillary: 220 mg/dL — ABNORMAL HIGH (ref 70–99)
Glucose-Capillary: 280 mg/dL — ABNORMAL HIGH (ref 70–99)
Glucose-Capillary: 281 mg/dL — ABNORMAL HIGH (ref 70–99)
Glucose-Capillary: 67 mg/dL — ABNORMAL LOW (ref 70–99)
Glucose-Capillary: 95 mg/dL (ref 70–99)

## 2020-08-21 LAB — FERRITIN: Ferritin: 1766 ng/mL — ABNORMAL HIGH (ref 24–336)

## 2020-08-21 LAB — D-DIMER, QUANTITATIVE: D-Dimer, Quant: 2.2 ug/mL-FEU — ABNORMAL HIGH (ref 0.00–0.50)

## 2020-08-21 LAB — MAGNESIUM: Magnesium: 3.1 mg/dL — ABNORMAL HIGH (ref 1.7–2.4)

## 2020-08-21 LAB — C-REACTIVE PROTEIN: CRP: 12.6 mg/dL — ABNORMAL HIGH (ref <1.0)

## 2020-08-21 LAB — PROCALCITONIN: Procalcitonin: 0.33 ng/mL

## 2020-08-21 LAB — PHOSPHORUS: Phosphorus: 4.2 mg/dL (ref 2.5–4.6)

## 2020-08-21 LAB — APTT: aPTT: 79 seconds — ABNORMAL HIGH (ref 24–36)

## 2020-08-21 LAB — HEPARIN LEVEL (UNFRACTIONATED): Heparin Unfractionated: 1 IU/mL — ABNORMAL HIGH (ref 0.30–0.70)

## 2020-08-21 MED ORDER — VITAL 1.5 CAL PO LIQD: 1000.0000 mL | ORAL | Status: DC

## 2020-08-21 MED ORDER — INSULIN DETEMIR 100 UNIT/ML ~~LOC~~ SOLN
12.0000 [IU] | Freq: Two times a day (BID) | SUBCUTANEOUS | Status: DC
  Administered 2020-08-21 – 2020-08-23 (×5): 12 [IU] via SUBCUTANEOUS
  Filled 2020-08-21 (×6): qty 0.12

## 2020-08-21 MED ORDER — ALBUTEROL SULFATE HFA 108 (90 BASE) MCG/ACT IN AERS
2.0000 | INHALATION_SPRAY | RESPIRATORY_TRACT | Status: DC | PRN
  Administered 2020-09-03 – 2020-09-12 (×3): 2 via RESPIRATORY_TRACT
  Filled 2020-08-21: qty 6.7

## 2020-08-21 MED ORDER — INSULIN ASPART 100 UNIT/ML ~~LOC~~ SOLN
7.0000 [IU] | SUBCUTANEOUS | Status: DC
  Administered 2020-08-21: 7 [IU] via SUBCUTANEOUS

## 2020-08-21 MED ORDER — HEPARIN (PORCINE) 25000 UT/250ML-% IV SOLN
1350.0000 [IU]/h | INTRAVENOUS | Status: DC
  Administered 2020-08-21 – 2020-08-22 (×3): 1300 [IU]/h via INTRAVENOUS
  Administered 2020-08-23: 1250 [IU]/h via INTRAVENOUS
  Administered 2020-08-25 – 2020-08-26 (×3): 1300 [IU]/h via INTRAVENOUS
  Filled 2020-08-21 (×7): qty 250

## 2020-08-21 NOTE — Progress Notes (Signed)
Initial Nutrition Assessment  DOCUMENTATION CODES:   Not applicable  INTERVENTION:   Tube Feeding:  Vital 1.5 at 50 ml/hr Pro-Source TF 45 mL BID Provides 1880 kcals and 103 g of protein and 912 mL of free water Meets 100% estimated calorie and protein needs  NUTRITION DIAGNOSIS:   Inadequate oral intake related to acute illness as evidenced by NPO status.  GOAL:   Patient will meet greater than or equal to 90% of their needs  MONITOR:   Vent status, Labs, Weight trends, TF tolerance  REASON FOR ASSESSMENT:   Consult, Ventilator Enteral/tube feeding initiation and management  ASSESSMENT:   69 yo male admitted with recent COVID-19 diagnosis with worsening respiratory failure and left pneumothorax.  PMH includes DM, HTN, GERD.   9/01 Treated for COVID-19 9/18 Admitted to [facility], transferred to [facility] 9/19 Intubated, L. Pneumothorax, Chest tube placed  Pt sedated on vent support; requiring levophed  Tolerating Vital High Protein at 40 ml/hr via OG tube  Unable to obtain diet and weight history  Current weight 78.9 kg; based on weight encounters, pt has experienced weight loss. 7-13% wt loss in 2-3 weeks. Pt with weight of 90.7 kg on COVID dx 9/01, weight of 85 kg on 9/08.   Labs: CBGs 220-309 (ICU goal 140-180), Creatinine 1.26, BUN 61 Meds: ss novolog, novolog q 4, levemir, tradjenta, solumedrol   Diet Order:   Diet Order            Diet NPO time specified  Diet effective now                 EDUCATION NEEDS:   Not appropriate for education at this time  Skin:  Skin Assessment: Skin Integrity Issues: Skin Integrity Issues:: Stage I Stage I: buttocks  Last BM:  PTA  Height:   Ht Readings from Last 1 Encounters:  08/19/20 5\' 10"  (1.778 m)    Weight:   Wt Readings from Last 1 Encounters:  08/21/20 78.9 kg    BMI:  Body mass index is 24.96 kg/m.  Estimated Nutritional Needs:   Kcal:  1700-1900 kcals  Protein:  95-120 g  Fluid:  >/= 1.7  L    Kerman Passey MS, RDN, LDN, CNSC Registered Dietitian III Clinical Nutrition RD Pager and On-Call Pager Number Located in [place]

## 2020-08-21 NOTE — Plan of Care (Addendum)
  Problem: Respiratory: Goal: Ability to maintain a clear airway and adequate ventilation will improve Outcome: Progressing   Sacral pressure injury noted as stage II, adjusted charting as such. Wound present upon admission. Spouse, Inez Catalina, stated to this RN that she has some cream she was putting on it from his previous recent admission. Asked for name of cream, she will look and call back.   Pt extubated today, tolerating diet well. Clear voice. 5L [place], SpO2 84-90, minimal reserve noted during turning for bath--desat to 74%. Norepi weaned off this shift. MAP goal 65 met. Will continue to monitor.

## 2020-08-21 NOTE — Procedures (Signed)
Extubation Procedure Note  Patient Details:   Name: Delon Revelo DOB: 1951/06/21 MRN: [id number]   Airway Documentation:    Vent end date: 08/21/20 Vent end time: 1249   Evaluation  O2 sats: stable throughout Complications: No apparent complications Patient did tolerate procedure well. Bilateral Breath Sounds: Diminished   Yes   Pt extubated to 5L [place] per MD order. Positive cuff leak noted prior to extubation. Pt able to speak and has a strong productive cough post extubation. Pt encouraged to use [place] to clear secretions  Jesse Sans 08/21/2020, 12:49 PM

## 2020-08-21 NOTE — Progress Notes (Signed)
NAME:  Tim Day, MRN:  [id number], DOB:  03-22-1951, LOS: 2 ADMISSION DATE:  08/19/2020, CONSULTATION DATE:  9/19 REFERRING MD:  Roderic Palau, CHIEF COMPLAINT:  Dyspnea   Brief History   69 year old with hx of recent COVID diagnosis presenting with worsening hypoxemic resp failure and left pneumothorax.  Past Medical History  GERD HTN DM2 A1c 7.2%  Significant Hospital Events   9/1 treated for COVID, noted to have bilateral DVT, d/c home 9/13 on O2, returned with pneumothorax 9/18 [facility] to [facility], chest tube exchanged prior to transfer; chest tube occluded, required maniuplation. 9/19 second chest tube placed. Intubated. CVL placed.  9/20 weaning; appears comfortable on spontaneous breathing trial.  Still having bloody output from both chest tubes, heparin infusion placed on hold for 8 hours.  Working towards extubation Consults:  n/a  Procedures:  9/18 Left chest tube 30 Fr 9/19: Left anterior chest tube 9/19: Left subclavian triple-lumen catheter 9/19 endotracheal tube Significant Diagnostic Tests:    Micro Data:  8/25 SARS cov 2 > positive 9/1 SARS cov 2 > positive 9/18 SARS cov 2> positive  9/18 Blood cx x 2>>  Antimicrobials:  9/1 remdesivir > completed course  9/7 baricitinib > 9/13, 9/18 >   Interim history/subjective:   He appears comfortable.  Indicates he would like something to drink.  Wants endotracheal tube out Objective   Blood pressure 118/78, pulse 100, temperature 98.4 F (36.9 C), temperature source Axillary, resp. rate 14, height 5\' 10"  (1.778 m), weight 78.9 kg, SpO2 95 %.    Vent Mode: PRVC FiO2 (%):  [40 %-100 %] 40 % Set Rate:  [24 bmp] 24 bmp Vt Set:  [440 mL] 440 mL PEEP:  [10 cmH20] 10 cmH20 Plateau Pressure:  [24 cmH20-30 cmH20] 24 cmH20   Intake/Output Summary (Last 24 hours) at 08/21/2020 0834 Last data filed at 08/21/2020 0745 Gross per 24 hour  Intake 3747.83 ml  Output 1634 ml  Net 2113.83 ml   Filed Weights   08/19/20  0723 08/20/20 0500 08/21/20 0500  Weight: 84 kg 78.6 kg 78.9 kg    Examination: General 69 year old white male currently on pressure support ventilation HEENT normocephalic atraumatic no jugular venous distention appreciated orally intubated Pulmonary: Diminished bilaterally.  Currently on pressure support ventilation.  PEEP 5/pressure support 5, FiO2 40%.  Tidal volume in the 700-800 range, no accessory use.  Left chest tubes in place.  There is no tidal from the large bore lateral tube.  The small bore anterior tube has approximately 1 out of 7 airleak, there is still bloody output from chest tube Cardiac: Regular rate and rhythm Abdomen: Soft nontender Extremities: Warm dry brisk cap refill trace edema Neuro: Awake, moves all extremities, no focal deficits appreciated  Resolved Hospital Problem list     Assessment & Plan:  Acute hypoxemic respiratory failure due to COVID ARDS  -Complicated acutely by left pneumothorax  -Currently on spontaneous breathing trial Plan  baricitinib thru 9/25 Initiate steroid taper 9/21-->will reduce over about 10d Extubate today DC IV fentanyl infusion Supplemental oxygen Mobilization with incentive spirometry  Possible HCAP -No culture data -wbc cnt elevated but improved. abd nuet 14. But PCT neg Plan Complete 5 d total zosyn (PNA NOS)  Left pneumothorax, chest tube clotting with blood -Status post secondary small bore chest tube on 9/19 -There is a very minimal left residual pneumothorax at the apex persistent bilateral airspace disease this was from imaging on 9/19 post chest tube insertion -Airleak: Present, 1 out  of 7 Plan We will keep both chest tubes in place for now, suspect we can just remove the large bore chest tube, just need to be sure it is not actively draining  AKI -scr better Plan Keep euvolemic Am chemistry  Strict I&O Renal dose meds   DM2 with hyperglycemia-glycemic control is poor Plan Adjusted aspart tube feed  coverage Inc Levemir 12 units twice daily Continue current sliding scale His steroid dosing is due to decreased on 9/21, we may need to readdress every 4 hour coverage at that point  BL LE DVT Plan Holding IV heparin x8 hours given active bleeding from chest tube  Best practice:  Diet: start tube feeding Pain/Anxiety/Delirium protocol (if indicated): PAD protocol, fentanyl infusion, RASS -2 VAP protocol (if indicated): yes DVT prophylaxis: heparin in fusion GI prophylaxis: famotidine Glucose control: SSI Mobility: bed rest Code Status: full Family Communication: I updated his wife by phone on 9/19 Disposition: Keep in the intensive care   Critical care time: 42 minutes

## 2020-08-21 NOTE — Progress Notes (Signed)
Patient started having desaturation issues and increased work of breathing on 5lpm nasal cannula. Probe changed from ear lobe to the finger with Sp02 on 75-77%. Placed patient on heated high flow at 40LPM and 100% along with 100% NRB. E-Link notified and chest x-ray ordered.

## 2020-08-21 NOTE — Progress Notes (Addendum)
Pt tolerated Nurse driven bedside swallow post extubation. No issues with ice chips, water with straw and graham crackers. Able to clear throat, no issues. Clear, strong voice. Will start diet per verbal order by CCM Laurey Arrow, NP).

## 2020-08-21 NOTE — Progress Notes (Signed)
Hypoglycemic Event  CBG: 67  Treatment: 8 oz juice/soda  Symptoms: None  Follow-up CBG: Time: 2004 CBG Result:129  Possible Reasons for Event: Inadequate meal intake    [facility]

## 2020-08-21 NOTE — Progress Notes (Signed)
eLink Physician-Brief Progress Note Patient Name: Tim Day DOB: 07/29/1951 MRN: [id number]   Date of Service  08/21/2020  HPI/Events of Note  Hypoxia - O2 requirement has increased from 5 L/min [place] to 40 L/min HHFN + NRB. History of L pneumothorax with 2 chest tube. Nursing states that they have been irrigating the chest tubes.   eICU Interventions  Plan:  1. Portable CXR STAT. 2. Will request that the ground team evaluate the patient at bedside.      Intervention Category Major Interventions: Hypoxemia - evaluation and management  Rosa Gambale Cornelia Copa 08/21/2020, 11:47 PM

## 2020-08-21 NOTE — Progress Notes (Addendum)
ANTICOAGULATION CONSULT NOTE - Follow Up Consult  Pharmacy Consult for Heparin Indication: VTE Treatment  Allergies  Allergen Reactions  . Eggs Or Egg-Derived Products Hives    Patient Measurements: Height: 5\' 10"  (177.8 cm) Weight: 78.9 kg (173 lb 15.1 oz) IBW/kg (Calculated) : 73 Heparin Dosing Weight: 78.9 kg  Vital Signs: Temp: 98.4 F (36.9 C) (09/20 0700) Temp Source: Oral (09/20 0700) BP: 118/78 (09/20 0800) Pulse Rate: 100 (09/20 0800)  Labs: Recent Labs    08/19/20 0730 08/19/20 1259 08/19/20 1538 08/19/20 1954 08/20/20 0442 08/20/20 0500 08/20/20 0500 08/20/20 0709 08/20/20 0710 08/20/20 1236 08/21/20 0408  HGB   < >  --   --  16.9   < > 14.9   < >  --   --  14.6 13.0  HCT   < >  --   --  49.8   < > 44.3  --   --   --  43.0 40.4  PLT   < >  --   --  267  --  386  --   --   --   --  342  APTT  --  28  --   --   --   --   --  22*  --   --  79*  HEPARINUNFRC  --   --  1.42*  --   --   --   --   --  0.68  --  1.00*  CREATININE   < >  --   --  1.02  --  1.33*  --   --   --   --  1.26*   < > = values in this interval not displayed.    Estimated Creatinine Clearance: 58.7 mL/min (A) (by C-G formula based on SCr of 1.26 mg/dL (H)).   Medications:  Infusions:  . sodium chloride    . fentaNYL infusion INTRAVENOUS 250 mcg/hr (08/21/20 0600)  . heparin 1,400 Units/hr (08/21/20 0600)  . norepinephrine (LEVOPHED) Adult infusion 16 mcg/min (08/21/20 0600)  . piperacillin-tazobactam (ZOSYN)  IV 12.5 mL/hr at 08/21/20 0600    Assessment: 69 y.o. male with a history of T2DM, HTN, HLD, GERD, and recent hospitalization for covid-19 pneumonia (discharged 9/3 on 6LPM O2; completed remdesivir infusions as outpatient 9/5). Patient found to have acute bilateral DVTs with concern for PE but no definitive PE was noted on imaging. Eliquis initiated at previous admission  And continued treatment as outpatient for DVT.(discharged 9/13  The patient presented today with  increasing SOB. Patients last dose of eliquis was 9/17 at 1900. Pharmacy asked to transition to heparin.   APTT this AM is 79 - therapeuticn x2 occurrences for goal of 66-102.  Heparin level remains elevated at 1 due to recent Eliquis - will continue to follow aPTTs for now.  CBC is stable.   Chest tube with significant bleeding - discussed with CCM NP, plan to hold x8 hours then resume.   Goal of Therapy:  Heparin level 0.3-0.7 units/ml aPTT 66-102 seconds Monitor platelets by anticoagulation protocol: Yes   Plan:  Hold IV Heparin x8 hours. Will plan to resume at lower rate of 1300 units/hr at 1730PM.  Re-evaluate bleeding before restart.  Follow-up aPTT and heparin level until correlating.  Follow-up daily CBC daily  Follow-up ability to change back to Eliquis when able  Sloan Leiter, PharmD, BCPS, BCCCP Clinical Pharmacist Please refer to [facility] for [place] numbers 08/21/2020,8:29 AM

## 2020-08-22 ENCOUNTER — Inpatient Hospital Stay (HOSPITAL_COMMUNITY): Payer: 59

## 2020-08-22 LAB — CBC WITH DIFFERENTIAL/PLATELET
Abs Immature Granulocytes: 0.06 10*3/uL (ref 0.00–0.07)
Basophils Absolute: 0 10*3/uL (ref 0.0–0.1)
Basophils Relative: 0 %
Eosinophils Absolute: 0 10*3/uL (ref 0.0–0.5)
Eosinophils Relative: 0 %
HCT: 39 % (ref 39.0–52.0)
Hemoglobin: 13 g/dL (ref 13.0–17.0)
Immature Granulocytes: 1 %
Lymphocytes Relative: 5 %
Lymphs Abs: 0.5 10*3/uL — ABNORMAL LOW (ref 0.7–4.0)
MCH: 29.6 pg (ref 26.0–34.0)
MCHC: 33.3 g/dL (ref 30.0–36.0)
MCV: 88.8 fL (ref 80.0–100.0)
Monocytes Absolute: 0.4 10*3/uL (ref 0.1–1.0)
Monocytes Relative: 4 %
Neutro Abs: 9 10*3/uL — ABNORMAL HIGH (ref 1.7–7.7)
Neutrophils Relative %: 90 %
Platelets: 176 10*3/uL (ref 150–400)
RBC: 4.39 MIL/uL (ref 4.22–5.81)
RDW: 13.6 % (ref 11.5–15.5)
WBC: 10 10*3/uL (ref 4.0–10.5)
nRBC: 0 % (ref 0.0–0.2)

## 2020-08-22 LAB — COMPREHENSIVE METABOLIC PANEL
ALT: 91 U/L — ABNORMAL HIGH (ref 0–44)
AST: 45 U/L — ABNORMAL HIGH (ref 15–41)
Albumin: 1.9 g/dL — ABNORMAL LOW (ref 3.5–5.0)
Alkaline Phosphatase: 79 U/L (ref 38–126)
Anion gap: 7 (ref 5–15)
BUN: 41 mg/dL — ABNORMAL HIGH (ref 8–23)
CO2: 25 mmol/L (ref 22–32)
Calcium: 7.7 mg/dL — ABNORMAL LOW (ref 8.9–10.3)
Chloride: 107 mmol/L (ref 98–111)
Creatinine, Ser: 0.83 mg/dL (ref 0.61–1.24)
GFR calc Af Amer: >60 mL/min (ref >60)
GFR calc non Af Amer: >60 mL/min (ref >60)
Glucose, Bld: 101 mg/dL — ABNORMAL HIGH (ref 70–99)
Potassium: 4.3 mmol/L (ref 3.5–5.1)
Sodium: 139 mmol/L (ref 135–145)
Total Bilirubin: 0.7 mg/dL (ref 0.3–1.2)
Total Protein: 5.2 g/dL — ABNORMAL LOW (ref 6.5–8.1)

## 2020-08-22 LAB — GLUCOSE, CAPILLARY
Glucose-Capillary: 153 mg/dL — ABNORMAL HIGH (ref 70–99)
Glucose-Capillary: 154 mg/dL — ABNORMAL HIGH (ref 70–99)
Glucose-Capillary: 185 mg/dL — ABNORMAL HIGH (ref 70–99)
Glucose-Capillary: 201 mg/dL — ABNORMAL HIGH (ref 70–99)
Glucose-Capillary: 85 mg/dL (ref 70–99)
Glucose-Capillary: 93 mg/dL (ref 70–99)

## 2020-08-22 LAB — HEPARIN LEVEL (UNFRACTIONATED): Heparin Unfractionated: 0.82 IU/mL — ABNORMAL HIGH (ref 0.30–0.70)

## 2020-08-22 LAB — D-DIMER, QUANTITATIVE: D-Dimer, Quant: 1.82 ug/mL-FEU — ABNORMAL HIGH (ref 0.00–0.50)

## 2020-08-22 LAB — APTT
aPTT: 48 seconds — ABNORMAL HIGH (ref 24–36)
aPTT: 73 seconds — ABNORMAL HIGH (ref 24–36)

## 2020-08-22 LAB — C-REACTIVE PROTEIN: CRP: 5.8 mg/dL — ABNORMAL HIGH (ref <1.0)

## 2020-08-22 LAB — FERRITIN: Ferritin: 1405 ng/mL — ABNORMAL HIGH (ref 24–336)

## 2020-08-22 MED ORDER — FUROSEMIDE 10 MG/ML IJ SOLN
INTRAMUSCULAR | Status: AC
  Administered 2020-08-22: 80 mg via INTRAVENOUS
  Filled 2020-08-22: qty 8

## 2020-08-22 MED ORDER — FUROSEMIDE 10 MG/ML IJ SOLN: 80.0000 mg | Freq: Once | INTRAMUSCULAR | Status: AC

## 2020-08-22 MED ORDER — POLYETHYLENE GLYCOL 3350 17 G PO PACK
17.0000 g | PACK | Freq: Two times a day (BID) | ORAL | Status: DC
  Administered 2020-08-23 – 2020-08-24 (×3): 17 g
  Filled 2020-08-22 (×3): qty 1

## 2020-08-22 MED ORDER — SODIUM CHLORIDE 0.9% FLUSH
10.0000 mL | Freq: Two times a day (BID) | INTRAVENOUS | Status: DC
  Administered 2020-08-22 – 2020-08-25 (×7): 10 mL

## 2020-08-22 MED ORDER — LIDOCAINE HCL (PF) 1 % IJ SOLN
INTRAMUSCULAR | Status: AC
  Filled 2020-08-22: qty 5

## 2020-08-22 MED ORDER — LORAZEPAM 2 MG/ML IJ SOLN
1.0000 mg | Freq: Once | INTRAMUSCULAR | Status: AC
  Administered 2020-08-22: 1 mg via INTRAVENOUS
  Filled 2020-08-22: qty 1

## 2020-08-22 MED ORDER — SODIUM CHLORIDE 0.9% FLUSH: 10.0000 mL | INTRAVENOUS | Status: DC | PRN

## 2020-08-22 MED ORDER — LIDOCAINE HCL (PF) 1 % IJ SOLN
INTRAMUSCULAR | Status: AC
  Filled 2020-08-22: qty 10

## 2020-08-22 MED ORDER — FLEET ENEMA 7-19 GM/118ML RE ENEM
1.0000 | ENEMA | Freq: Once | RECTAL | Status: DC
  Filled 2020-08-22: qty 1

## 2020-08-22 NOTE — Procedures (Signed)
Insertion of Chest Tube Procedure Note  Fallou Hulbert  [id number]  October 27, 1951  Date:08/22/20  Time:1:21 AM    Provider Performing: Leanna Sato. Meztli Llanas   Procedure: Chest Tube Insertion (62376)  Indication(s) Pneumothorax on LT inspite of 2 prior chest tubes, large bore chest tube noted to be blocked with blood clots  Consent Unable to obtain consent due to emergent nature of procedure. Verbally informed pt  Anesthesia Topical only with 1% lidocaine    Time Out Verified patient identification, verified procedure, site/side was marked, verified correct patient position, special equipment/implants available, medications/allergies/relevant history reviewed, required imaging and test results available.   Sterile Technique Maximal sterile technique including full sterile barrier drape, hand hygiene, sterile gown, sterile gloves, mask, hair covering, sterile ultrasound probe cover (if used).   Procedure Description Lt non functioning chest tube removed Area of placement cleaned and draped in sterile fashion.  A 28 French chest tube was placed into the left pleural space using trochar. Appropriate return of air was obtained.  The tube was connected to atrium and placed on -20 cm H2O wall suction.   Complications/Tolerance None; patient tolerated the procedure well. Chest X-ray is ordered to verify placement >> showed re-expansion of left lung   EBL Minimal  Specimen(s) none   Junaid Wurzer V. Elsworth Soho MD

## 2020-08-22 NOTE — Progress Notes (Signed)
ANTICOAGULATION CONSULT NOTE - Follow Up Consult  Pharmacy Consult for Heparin Indication: VTE Treatment   Assessment: 69 y.o. male with a history of T2DM, HTN, HLD, GERD, and recent hospitalization for covid-19 pneumonia (discharged 9/3 on 6LPM O2; completed remdesivir infusions as outpatient 9/5). Patient found to have acute bilateral DVTs with concern for PE but no definitive PE was noted on imaging. Eliquis initiated at previous admission  And continued treatment as outpatient for DVT.(discharged 9/13  The patient presented today with increasing SOB. Patients last dose of eliquis was 9/17 at 1900. Pharmacy asked to transition to heparin.   APTT this am 48 sec.  Still bleeding from chest tubes  Goal of Therapy:  Heparin level 0.3-0.7 units/ml aPTT 66-102 seconds Monitor platelets by anticoagulation protocol: Yes   Plan:  Continue heparin at 1300 units/hr.  Check heparin level and aPTT in ~ 6 hours  Thanks for allowing pharmacy to be a part of this patient's care.  Excell Seltzer, PharmD Clinical Pharmacist  08/22/2020,3:08 AM

## 2020-08-22 NOTE — Progress Notes (Signed)
ANTICOAGULATION CONSULT NOTE - Follow Up Consult  Pharmacy Consult for Heparin Indication: VTE Treatment   Assessment: 69 y.o. male with a history of T2DM, HTN, HLD, GERD, and recent hospitalization for covid-19 pneumonia (discharged 9/3 on 6LPM O2; completed remdesivir infusions as outpatient 9/5). Patient found to have acute bilateral DVTs with concern for PE but no definitive PE was noted on imaging. Eliquis initiated at previous admission  And continued treatment as outpatient for DVT.(discharged 9/13  The patient presented today with increasing SOB. Patients last dose of eliquis was 9/17 at 1900. Pharmacy asked to transition to heparin.   APTT is therapeutic at 73 on currently rate of 1300 units/hr.  Heparin level is still prolonged due to recent Eliquis but trending down appropriately. Discussed with CCM NP - wants patient to remain at goal. Bleeding improved from chest tube and clot issues overnight.   Goal of Therapy:  Heparin level 0.3-0.7 units/ml aPTT 66-102 seconds Monitor platelets by anticoagulation protocol: Yes   Plan:  Continue heparin at 1300 units/hr.   Follow-up daily aPTT, heparin level until correlating.  Monitor CBC while on therapy.  Monitor bleeding  Thanks for allowing pharmacy to be a part of this patient's care.  Sloan Leiter, PharmD, BCPS, BCCCP Clinical Pharmacist Please refer to [facility] for [place] numbers 08/22/2020,12:42 PM

## 2020-08-22 NOTE — Progress Notes (Addendum)
NAME:  Tim Day, MRN:  [id number], DOB:  06-18-1951, LOS: 3 ADMISSION DATE:  08/19/2020, CONSULTATION DATE:  9/19 REFERRING MD:  Roderic Palau, CHIEF COMPLAINT:  Dyspnea   Brief History   69 year old with hx of recent COVID diagnosis presenting with worsening hypoxemic resp failure and left pneumothorax.  Past Medical History  GERD HTN DM2 A1c 7.2%  Significant Hospital Events   9/1 treated for COVID, noted to have bilateral DVT, d/c home 9/13 on O2, returned with pneumothorax 9/18 [facility] to [facility], chest tube exchanged prior to transfer; chest tube occluded, required maniuplation. 9/19 second chest tube placed. Intubated. CVL placed.  9/20 weaning; appears comfortable on spontaneous breathing trial.  Still having bloody output from both chest tubes, heparin infusion placed on hold for 8 hours.  Extubated. Late in evening oxygen requirements trending up. sats 75% in spite of high flow. XRAY w/ large PTX.. chest tubes not functioning. Lateral chest tube removed. Obstructed w/ old clot. New 28 french chest tube placed.  9/21 still short of breath. airleak 1/7 on lateral tube w/ tidal req high flow and NRB Consults:  n/a  Procedures:  9/18 Left chest tube 30 Fr 9/21 Left lateral 28 fr chest tube 9/21>>> 9/19: Left anterior chest tube 9/19: Left subclavian triple-lumen catheter 9/19 endotracheal tube Significant Diagnostic Tests:    Micro Data:  8/25 SARS cov 2 > positive 9/1 SARS cov 2 > positive 9/18 SARS cov 2> positive  9/18 Blood cx x 2>>  Antimicrobials:  9/1 remdesivir > completed course  9/7 baricitinib > 9/13, 9/18 >  Zosyn 9/18>>> Interim history/subjective:   Very SOB acutely again this am  Objective   Blood pressure 123/86, pulse (Abnormal) 102, temperature 98.2 F (36.8 C), temperature source Axillary, resp. rate (Abnormal) 22, height 5\' 10"  (1.778 m), weight 78.6 kg, SpO2 95 %.    FiO2 (%):  [40 %-100 %] 100 %   Intake/Output Summary (Last 24 hours) at  08/22/2020 0818 Last data filed at 08/22/2020 0600 Gross per 24 hour  Intake 2272.23 ml  Output 2292 ml  Net -19.77 ml   Filed Weights   08/20/20 0500 08/21/20 0500 08/22/20 0500  Weight: 78.6 kg 78.9 kg 78.6 kg    Examination: This is a 69 year old white male he is currently lying in bed in the upright position.  He is endorsing significant resting shortness of breath and exhibiting marked accessory use HEENT normocephalic atraumatic does exhibit nasal flare has high flow oxygen as well as nonrebreather mask in place has some mild scleral edema Pulmonary: As mentioned before positive for accessory use some scattered rhonchi, a little more diminished on the left than right.  Chest tube assessment: The left lateral chest tube exhibits a 1 out of 7 airleak with good tidal.  There is pleural fluid in the tube, this is serous sanguinous appearing but less grossly blood when compared to yesterday.  The anterior chest tube is in place, however there is no air leak.  Flushes, but appears nonfunctional currently Cardiac: Regular rate and rhythm Abdomen: Soft not tender no organomegaly Extremities warm dry brisk capillary refill Neuro awake anxious but oriented moving all extremities without focal deficits  Resolved Hospital Problem list   AKI resolved as of 9/21  Assessment & Plan:  Acute hypoxemic respiratory failure due to COVID ARDS  -Complicated acutely by left pneumothorax  -back on high flow overnight.  PCXR no PTX; bilateral airspace disease persists. Aeration a little worse. CTs in good position.  No PTX this am -can't get good echo window d/t PTX Plan  Cont baricitinib thru 9/25 Steroid taper (to start today) will decrease over ~10 d Mobilize IS  Lasix today Oxygen titrate high flow and alternate w/ NIPPV Sat goal >85 Therapeutic IV heparin (has the DVT) certainly could also have VTE involving the lung Would not be a systemic TPA candidate but could consider catheter directed     Possible HCAP -No culture data -wbc normalized; no fever Plan Zosyn day 4 of 5  Left pneumothorax, chest tube clotting with blood -chest tubes obstructed again on 9/21. New lateral 28 fr inserted. PTX resolved.  -Airleak: 1/7 Plan Cont ct to sxn  DM2 with hyperglycemia-glycemic control better/ actually now lower.  Plan Dc schedule TF coverage Cont Levemir at 12 units Once eating will prob need meal time Coverage   BL LE DVT Plan Cont heparin gtt Monitor CBC (had blood clotting chest tube)  Constipation Plan LOC  Best practice:  Diet: start tube feeding-->stopped 9/21 Pain/Anxiety/Delirium protocol (if indicated): stopped 9/21 VAP protocol (if indicated): stopped 9/21 DVT prophylaxis: heparin in fusion GI prophylaxis: famotidine Glucose control: SSI Mobility: bed rest Code Status: full Family Communication: I updated his wife by phone on 9/19 Disposition: Keep in the intensive care   Critical care time:  35 minutes   Erick Colace ACNP-BC [place] Pager # [phone] OR # [phone] if no answer

## 2020-08-22 NOTE — Progress Notes (Signed)
eLink Physician-Brief Progress Note Patient Name: Tim Day DOB: 13-Sep-1951 MRN: [id number]   Date of Service  08/22/2020  HPI/Events of Note  Hypoxia - Portable CXR reveals recurrent L pneumothorax in spite of 2 L chest tubes.   eICU Interventions  Plan: 1. Will notify ground team of findings.     Intervention Category Major Interventions: Hypoxemia - evaluation and management  Rockland Kotarski [place] 08/22/2020, 12:22 AM

## 2020-08-22 NOTE — Progress Notes (Addendum)
Patient began having desaturations to the 70s and increased work of breathing. Respiratory called to bedside to re-evaluate patient and e-link was also notified. Stat chest x-ray ordered. Critical care to bedside replacing lower left chest tube dur to clot, another chest x-ray ordered, critical care looked at follow-up chest x-ray to confirm placement.  Patient now sating 90% with better work of breathing, will continue to monitor.

## 2020-08-23 ENCOUNTER — Inpatient Hospital Stay (HOSPITAL_COMMUNITY): Payer: 59

## 2020-08-23 DIAGNOSIS — J9311 Primary spontaneous pneumothorax: Secondary | ICD-10-CM

## 2020-08-23 LAB — CBC WITH DIFFERENTIAL/PLATELET
Abs Immature Granulocytes: 0.09 10*3/uL — ABNORMAL HIGH (ref 0.00–0.07)
Basophils Absolute: 0 10*3/uL (ref 0.0–0.1)
Basophils Relative: 0 %
Eosinophils Absolute: 0 10*3/uL (ref 0.0–0.5)
Eosinophils Relative: 0 %
HCT: 39.7 % (ref 39.0–52.0)
Hemoglobin: 12.8 g/dL — ABNORMAL LOW (ref 13.0–17.0)
Immature Granulocytes: 1 %
Lymphocytes Relative: 6 %
Lymphs Abs: 0.7 10*3/uL (ref 0.7–4.0)
MCH: 28.8 pg (ref 26.0–34.0)
MCHC: 32.2 g/dL (ref 30.0–36.0)
MCV: 89.4 fL (ref 80.0–100.0)
Monocytes Absolute: 0.7 10*3/uL (ref 0.1–1.0)
Monocytes Relative: 6 %
Neutro Abs: 9.9 10*3/uL — ABNORMAL HIGH (ref 1.7–7.7)
Neutrophils Relative %: 87 %
Platelets: 166 10*3/uL (ref 150–400)
RBC: 4.44 MIL/uL (ref 4.22–5.81)
RDW: 13.5 % (ref 11.5–15.5)
WBC: 11.4 10*3/uL — ABNORMAL HIGH (ref 4.0–10.5)
nRBC: 0 % (ref 0.0–0.2)

## 2020-08-23 LAB — COMPREHENSIVE METABOLIC PANEL
ALT: 109 U/L — ABNORMAL HIGH (ref 0–44)
AST: 47 U/L — ABNORMAL HIGH (ref 15–41)
Albumin: 2 g/dL — ABNORMAL LOW (ref 3.5–5.0)
Alkaline Phosphatase: 75 U/L (ref 38–126)
Anion gap: 8 (ref 5–15)
BUN: 45 mg/dL — ABNORMAL HIGH (ref 8–23)
CO2: 30 mmol/L (ref 22–32)
Calcium: 7.9 mg/dL — ABNORMAL LOW (ref 8.9–10.3)
Chloride: 104 mmol/L (ref 98–111)
Creatinine, Ser: 0.89 mg/dL (ref 0.61–1.24)
GFR calc Af Amer: >60 mL/min (ref >60)
GFR calc non Af Amer: >60 mL/min (ref >60)
Glucose, Bld: 117 mg/dL — ABNORMAL HIGH (ref 70–99)
Potassium: 4 mmol/L (ref 3.5–5.1)
Sodium: 142 mmol/L (ref 135–145)
Total Bilirubin: 1 mg/dL (ref 0.3–1.2)
Total Protein: 5.6 g/dL — ABNORMAL LOW (ref 6.5–8.1)

## 2020-08-23 LAB — GLUCOSE, CAPILLARY
Glucose-Capillary: 101 mg/dL — ABNORMAL HIGH (ref 70–99)
Glucose-Capillary: 167 mg/dL — ABNORMAL HIGH (ref 70–99)
Glucose-Capillary: 305 mg/dL — ABNORMAL HIGH (ref 70–99)
Glucose-Capillary: 319 mg/dL — ABNORMAL HIGH (ref 70–99)
Glucose-Capillary: 319 mg/dL — ABNORMAL HIGH (ref 70–99)
Glucose-Capillary: 361 mg/dL — ABNORMAL HIGH (ref 70–99)
Glucose-Capillary: 68 mg/dL — ABNORMAL LOW (ref 70–99)
Glucose-Capillary: 87 mg/dL (ref 70–99)

## 2020-08-23 LAB — APTT
aPTT: 84 seconds — ABNORMAL HIGH (ref 24–36)
aPTT: 67 seconds — ABNORMAL HIGH (ref 24–36)

## 2020-08-23 LAB — HEPARIN LEVEL (UNFRACTIONATED)
Heparin Unfractionated: 0.74 IU/mL — ABNORMAL HIGH (ref 0.30–0.70)
Heparin Unfractionated: 0.6 IU/mL (ref 0.30–0.70)

## 2020-08-23 LAB — FERRITIN: Ferritin: 1221 ng/mL — ABNORMAL HIGH (ref 24–336)

## 2020-08-23 LAB — C-REACTIVE PROTEIN: CRP: 3 mg/dL — ABNORMAL HIGH (ref <1.0)

## 2020-08-23 LAB — D-DIMER, QUANTITATIVE: D-Dimer, Quant: 1.78 ug/mL-FEU — ABNORMAL HIGH (ref 0.00–0.50)

## 2020-08-23 MED ORDER — ENSURE ENLIVE PO LIQD
237.0000 mL | Freq: Three times a day (TID) | ORAL | Status: DC
  Administered 2020-08-23 – 2020-08-24 (×3): 237 mL via ORAL

## 2020-08-23 MED ORDER — POTASSIUM CHLORIDE CRYS ER 20 MEQ PO TBCR
40.0000 meq | EXTENDED_RELEASE_TABLET | Freq: Once | ORAL | Status: AC
  Administered 2020-08-23: 40 meq via ORAL
  Filled 2020-08-23: qty 2

## 2020-08-23 MED ORDER — DEXTROSE 50 % IV SOLN
12.5000 g | INTRAVENOUS | Status: AC
  Administered 2020-08-23: 12.5 g via INTRAVENOUS

## 2020-08-23 MED ORDER — MORPHINE SULFATE (PF) 2 MG/ML IV SOLN
2.0000 mg | INTRAVENOUS | Status: DC | PRN
  Administered 2020-08-23 – 2020-08-24 (×3): 2 mg via INTRAVENOUS
  Filled 2020-08-23 (×4): qty 1

## 2020-08-23 MED ORDER — FUROSEMIDE 10 MG/ML IJ SOLN: 40.0000 mg | Freq: Once | INTRAMUSCULAR | Status: DC

## 2020-08-23 MED ORDER — FUROSEMIDE 10 MG/ML IJ SOLN
40.0000 mg | Freq: Once | INTRAMUSCULAR | Status: AC
  Administered 2020-08-23: 40 mg via INTRAVENOUS
  Filled 2020-08-23: qty 4

## 2020-08-23 MED ORDER — FLEET ENEMA 7-19 GM/118ML RE ENEM
1.0000 | ENEMA | Freq: Once | RECTAL | Status: AC
  Administered 2020-08-23: 1 via RECTAL
  Filled 2020-08-23: qty 1

## 2020-08-23 NOTE — Progress Notes (Signed)
Evaluation for ending of isolation precautions.   Patient is 21 days from his positive test on 9/1.   He is clinically improving.  Seems his primary reason for acute respiratory failure has been related to PTX and chest tube issues.  Will end isolation and move to 76M.

## 2020-08-23 NOTE — Progress Notes (Signed)
Pt taken off bipap and placed on HHFNC at 35L/100%. Pt denies SOB, no increased WOB. Pt continuously asking for water. RN aware

## 2020-08-23 NOTE — Progress Notes (Signed)
NAME:  Tim Day, MRN:  [id number], DOB:  [birth year], LOS: 4 ADMISSION DATE:  08/19/2020, CONSULTATION DATE:  9/19 REFERRING MD:  Roderic Palau, CHIEF COMPLAINT:  Dyspnea   Brief History   69 year old with hx of recent COVID diagnosis presenting with worsening hypoxemic resp failure and left pneumothorax.  Past Medical History  GERD HTN DM2 A1c 7.2%  Significant Hospital Events   9/1 treated for COVID, noted to have bilateral DVT, d/c home 9/13 on O2, returned with pneumothorax 9/18 [facility] to [facility], chest tube exchanged prior to transfer; chest tube occluded, required maniuplation. 9/19 second chest tube placed. Intubated. CVL placed.  9/20 weaning; appears comfortable on spontaneous breathing trial.  Still having bloody output from both chest tubes, heparin infusion placed on hold for 8 hours.  Extubated. Late in evening oxygen requirements trending up. sats 75% in spite of high flow. XRAY w/ large PTX.. chest tubes not functioning. Lateral chest tube removed. Obstructed w/ old clot. New 28 french chest tube placed.  9/21 still short of breath. airleak 1/7 on lateral tube w/ tidal req high flow and NRB Weaning oxygen again 9/22 Consults:  n/a  Procedures:  9/18 Left chest tube 30 Fr 9/21 Left lateral 28 fr chest tube 9/21>>> 9/19: Left anterior chest tube 9/19: Left subclavian triple-lumen catheter 9/19 endotracheal tube Significant Diagnostic Tests:    Micro Data:  8/25 SARS cov 2 > positive 9/1 SARS cov 2 > positive 9/18 SARS cov 2> positive  9/18 Blood cx x 2>>  Antimicrobials:  9/1 remdesivir > completed course  9/7 baricitinib > 9/13, 9/18 >  Zosyn 9/18>>> Interim history/subjective:   Feels better Objective   Blood pressure 123/84, pulse (Abnormal) 110, temperature (Abnormal) 97.5 F (36.4 C), temperature source Axillary, resp. rate (Abnormal) 33, height 5\' 10"  (1.778 m), weight 77.1 kg, SpO2 90 %.    Vent Mode: BIPAP;PCV FiO2 (%):  [70 %-100 %] 100 % Set  Rate:  [12 bmp] 12 bmp PEEP:  [5 cmH20] 5 cmH20   Intake/Output Summary (Last 24 hours) at 08/23/2020 0948 Last data filed at 08/23/2020 0900 Gross per 24 hour  Intake 472.36 ml  Output 2710 ml  Net -2237.64 ml   Filed Weights   08/21/20 0500 08/22/20 0500 08/23/20 0409  Weight: 78.9 kg 78.6 kg 77.1 kg    Examination: General: 69 year old white male currently resting in bed he is in no acute distress today HEENT normocephalic atraumatic no jugular venous distention Pulm: 35 liter flow now on 90% RR mid 20s no accessory use. Some scattered rhonchi, more decreased on left. The left chest tubes are both in place> both at 40sxn and neither w. airleak today Card RRR hr 101 no MRG Ext: Warm dry brisk capillary refill strong pulses Abdomen soft nontender no organomegaly GU clear yellow  Resolved Hospital Problem list   AKI resolved as of 9/21  Assessment & Plan:  Acute hypoxemic respiratory failure due to COVID ARDS  -Complicated acutely by left pneumothorax  -Weaning high flow oxygen Portable chest x-ray personally reviewed diffuse bilateral airspace disease persists.  Left lung inflated, perhaps minimal less than 5% pneumothorax in the apex.  Small approximately 5% pneumothorax on the right now Plan  Continue baricitinib through 9/25  Steroid taper initiated  Alternate high flow oxygen and NIPPV as indicated for work of breathing Incentive spirometry IV Lasix Mobilization Continue systemic IV heparin Pulse oximetry  Possible HCAP -No culture data -wbc normalized; no fever Plan Day 5 of 5 Zosyn  Left pneumothorax, chest tube clotting with blood, has very small right pneumothorax which is new as of 9/22 -chest tubes obstructed again on 9/21. New lateral 28 fr inserted. PTX resolved.  -Airleak: Intermittent on the left side. Plan Continue left lateral chest tube to 40 cm suction Discontinue left apical chest tube Repeat chest x-ray this afternoon and in a.m. to ensure no  worsening of pneumothoraces  DM2 with hyperglycemia-glycemic control better/ actually now lower.  Plan Discontinue Levemir Continue sliding scale insulin Continue Tradjenta  BL LE DVT Plan Continue IV heparin  Constipation, resolved as of 9/21 Plan Laxative of choice Best practice:  Diet: start tube feeding-->stopped 9/21 Pain/Anxiety/Delirium protocol (if indicated): stopped 9/21 VAP protocol (if indicated): stopped 9/21 DVT prophylaxis: heparin in fusion GI prophylaxis: famotidine Glucose control: SSI Mobility: bed rest Code Status: full Family Communication: I updated his wife by phone on 9/19 Disposition: Keep in the intensive care   Critical care time:  35 minutes   Erick Colace ACNP-BC [place] Pager # [phone] OR # [phone] if no answer

## 2020-08-23 NOTE — Progress Notes (Addendum)
ANTICOAGULATION CONSULT NOTE - Follow Up Consult  Pharmacy Consult for Heparin Indication: VTE Treatment   Assessment: 69 y.o. male with a history of T2DM, HTN, HLD, GERD, and recent hospitalization for covid-19 pneumonia (discharged 9/3 on 6LPM O2; completed remdesivir infusions as outpatient 9/5). Patient found to have acute bilateral DVTs with concern for PE but no definitive PE was noted on imaging. Eliquis initiated at previous admission  And continued treatment as outpatient for DVT.(discharged 9/13  The patient presented today with increasing SOB. Patients last dose of eliquis was 9/17 at 1900. Pharmacy asked to transition to heparin.  -aPTT= 67 and heparin level 0.6  Goal of Therapy:  Heparin level 0.3-0.7 units/ml aPTT 66-102 seconds Monitor platelets by anticoagulation protocol: Yes   Plan:  Increase heparin to 1300 units/hr to keep in goal Follow-up daily aPTT, heparin level until correlating.  Monitor CBC while on therapy.  Monitor bleeding  Thanks for allowing pharmacy to be a part of this patient's care.  Hildred Laser, PharmD Clinical Pharmacist **Pharmacist phone directory can now be found on [email or web address] ([email or web address]).  Listed under [place].

## 2020-08-24 ENCOUNTER — Inpatient Hospital Stay (HOSPITAL_COMMUNITY): Payer: 59

## 2020-08-24 LAB — CBC WITH DIFFERENTIAL/PLATELET
Abs Immature Granulocytes: 0.11 10*3/uL — ABNORMAL HIGH (ref 0.00–0.07)
Basophils Absolute: 0 10*3/uL (ref 0.0–0.1)
Basophils Relative: 0 %
Eosinophils Absolute: 0 10*3/uL (ref 0.0–0.5)
Eosinophils Relative: 0 %
HCT: 38 % — ABNORMAL LOW (ref 39.0–52.0)
Hemoglobin: 12.5 g/dL — ABNORMAL LOW (ref 13.0–17.0)
Immature Granulocytes: 1 %
Lymphocytes Relative: 9 %
Lymphs Abs: 0.9 10*3/uL (ref 0.7–4.0)
MCH: 29.1 pg (ref 26.0–34.0)
MCHC: 32.9 g/dL (ref 30.0–36.0)
MCV: 88.6 fL (ref 80.0–100.0)
Monocytes Absolute: 0.7 10*3/uL (ref 0.1–1.0)
Monocytes Relative: 6 %
Neutro Abs: 8.8 10*3/uL — ABNORMAL HIGH (ref 1.7–7.7)
Neutrophils Relative %: 84 %
Platelets: 174 10*3/uL (ref 150–400)
RBC: 4.29 MIL/uL (ref 4.22–5.81)
RDW: 13.2 % (ref 11.5–15.5)
WBC: 10.5 10*3/uL (ref 4.0–10.5)
nRBC: 0 % (ref 0.0–0.2)

## 2020-08-24 LAB — GLUCOSE, CAPILLARY
Glucose-Capillary: 155 mg/dL — ABNORMAL HIGH (ref 70–99)
Glucose-Capillary: 122 mg/dL — ABNORMAL HIGH (ref 70–99)
Glucose-Capillary: 62 mg/dL — ABNORMAL LOW (ref 70–99)
Glucose-Capillary: 124 mg/dL — ABNORMAL HIGH (ref 70–99)
Glucose-Capillary: 253 mg/dL — ABNORMAL HIGH (ref 70–99)
Glucose-Capillary: 179 mg/dL — ABNORMAL HIGH (ref 70–99)
Glucose-Capillary: 165 mg/dL — ABNORMAL HIGH (ref 70–99)

## 2020-08-24 LAB — C-REACTIVE PROTEIN: CRP: 1.7 mg/dL — ABNORMAL HIGH (ref <1.0)

## 2020-08-24 LAB — COMPREHENSIVE METABOLIC PANEL
ALT: 87 U/L — ABNORMAL HIGH (ref 0–44)
AST: 28 U/L (ref 15–41)
Albumin: 1.9 g/dL — ABNORMAL LOW (ref 3.5–5.0)
Alkaline Phosphatase: 67 U/L (ref 38–126)
Anion gap: 8 (ref 5–15)
BUN: 43 mg/dL — ABNORMAL HIGH (ref 8–23)
CO2: 32 mmol/L (ref 22–32)
Calcium: 8 mg/dL — ABNORMAL LOW (ref 8.9–10.3)
Chloride: 96 mmol/L — ABNORMAL LOW (ref 98–111)
Creatinine, Ser: 0.83 mg/dL (ref 0.61–1.24)
GFR calc Af Amer: >60 mL/min (ref >60)
GFR calc non Af Amer: >60 mL/min (ref >60)
Glucose, Bld: 62 mg/dL — ABNORMAL LOW (ref 70–99)
Potassium: 4.1 mmol/L (ref 3.5–5.1)
Sodium: 136 mmol/L (ref 135–145)
Total Bilirubin: 0.6 mg/dL (ref 0.3–1.2)
Total Protein: 5.2 g/dL — ABNORMAL LOW (ref 6.5–8.1)

## 2020-08-24 LAB — CULTURE, BLOOD (ROUTINE X 2)
Culture: NO GROWTH
Culture: NO GROWTH
Culture: NO GROWTH
Culture: NO GROWTH
Special Requests: ADEQUATE
Special Requests: ADEQUATE
Special Requests: ADEQUATE

## 2020-08-24 LAB — D-DIMER, QUANTITATIVE: D-Dimer, Quant: 1.91 ug/mL-FEU — ABNORMAL HIGH (ref 0.00–0.50)

## 2020-08-24 LAB — HEPARIN LEVEL (UNFRACTIONATED): Heparin Unfractionated: 0.61 IU/mL (ref 0.30–0.70)

## 2020-08-24 LAB — FERRITIN: Ferritin: 63 ng/mL (ref 24–336)

## 2020-08-24 LAB — APTT: aPTT: 77 seconds — ABNORMAL HIGH (ref 24–36)

## 2020-08-24 MED ORDER — ORAL CARE MOUTH RINSE
15.0000 mL | Freq: Two times a day (BID) | OROMUCOSAL | Status: DC
  Administered 2020-08-26 – 2020-09-28 (×50): 15 mL via OROMUCOSAL

## 2020-08-24 MED ORDER — POLYETHYLENE GLYCOL 3350 17 G PO PACK
17.0000 g | PACK | Freq: Every day | ORAL | Status: DC | PRN
  Administered 2020-09-17 – 2020-09-19 (×2): 17 g via ORAL
  Filled 2020-08-24 (×2): qty 1

## 2020-08-24 MED ORDER — ONDANSETRON HCL 4 MG PO TABS: 4.0000 mg | ORAL_TABLET | Freq: Four times a day (QID) | ORAL | Status: DC | PRN

## 2020-08-24 MED ORDER — SALINE SPRAY 0.65 % NA SOLN
1.0000 | NASAL | Status: DC | PRN
  Filled 2020-08-24: qty 44

## 2020-08-24 MED ORDER — DOCUSATE SODIUM 50 MG/5ML PO LIQD
100.0000 mg | Freq: Two times a day (BID) | ORAL | Status: DC
  Administered 2020-08-24 – 2020-09-08 (×20): 100 mg via ORAL
  Filled 2020-08-24 (×29): qty 10

## 2020-08-24 MED ORDER — CHLORHEXIDINE GLUCONATE 0.12 % MT SOLN
15.0000 mL | Freq: Two times a day (BID) | OROMUCOSAL | Status: DC
  Administered 2020-08-24 – 2020-09-28 (×69): 15 mL via OROMUCOSAL
  Filled 2020-08-24 (×71): qty 15

## 2020-08-24 MED ORDER — POLYETHYLENE GLYCOL 3350 17 G PO PACK
17.0000 g | PACK | Freq: Two times a day (BID) | ORAL | Status: DC
  Administered 2020-08-24 – 2020-09-08 (×11): 17 g via ORAL
  Filled 2020-08-24 (×18): qty 1

## 2020-08-24 MED ORDER — PREDNISONE 10 MG PO TABS
50.0000 mg | ORAL_TABLET | Freq: Every day | ORAL | Status: DC
  Administered 2020-08-25: 50 mg via ORAL
  Filled 2020-08-24: qty 5

## 2020-08-24 MED ORDER — ONDANSETRON HCL 4 MG/2ML IJ SOLN: 4.0000 mg | Freq: Four times a day (QID) | INTRAMUSCULAR | Status: DC | PRN

## 2020-08-24 MED ORDER — LINAGLIPTIN 5 MG PO TABS
5.0000 mg | ORAL_TABLET | Freq: Every day | ORAL | Status: DC
  Administered 2020-08-25 – 2020-09-28 (×35): 5 mg via ORAL
  Filled 2020-08-24 (×35): qty 1

## 2020-08-24 MED ORDER — DOCUSATE SODIUM 50 MG/5ML PO LIQD
100.0000 mg | Freq: Two times a day (BID) | ORAL | Status: DC | PRN
  Administered 2020-09-02: 100 mg via ORAL
  Filled 2020-08-24 (×2): qty 10

## 2020-08-24 MED ORDER — ACETAMINOPHEN 325 MG PO TABS: 650.0000 mg | ORAL_TABLET | Freq: Four times a day (QID) | ORAL | Status: DC | PRN

## 2020-08-24 MED ORDER — BARICITINIB 2 MG PO TABS
4.0000 mg | ORAL_TABLET | Freq: Every day | ORAL | Status: AC
  Administered 2020-08-25: 4 mg via ORAL
  Filled 2020-08-24: qty 2

## 2020-08-24 NOTE — Progress Notes (Signed)
Inpatient Diabetes Program Recommendations  AACE/ADA: New Consensus Statement on Inpatient Glycemic Control (2015)  Target Ranges:  Prepandial:   less than 140 mg/dL      Peak postprandial:   less than 180 mg/dL (1-2 hours)      Critically ill patients:  140 - 180 mg/dL   Lab Results  Component Value Date   GLUCAP 62 (L) 08/24/2020   HGBA1C 7.2 (H) 08/08/2020    Review of Glycemic Control Results for Tim Day, Tim Day (MRN [id number]) as of 08/24/2020 09:23  Ref. Range 08/23/2020 23:41 08/24/2020 03:13 08/24/2020 06:07 08/24/2020 07:31  Glucose-Capillary Latest Ref Range: 70 - 99 mg/dL 305 (H) 155 (H) 122 (H) 62 (L)   Diabetes history: Type 2 DM Outpatient Diabetes medications: Trulicity 1.5 mg Qwk, Glipizide 5 mg BID, Toujeo 25 units QAM, Metformin 1000 mg BID Current orders for Inpatient glycemic control: Novolog 0-20 units Q4H, Tradjenta 5 mg QD Prednisone 50 mg QAM  Inpatient Diabetes Program Recommendations:   Noted mild low this AM of 62 mg/dL. Patient received >50 units of short acting insulin yesterday, assuming this is related.  Anticipate glucose trends to increase in setting of steroids especially with discontinuation of basal.  Consider adding back a portion of basal insulin.  - Levemir 10 units QD - Novolog 4 units TID (assuming patient is consuming >50% of meal)  Thanks, Bronson Curb, MSN, RNC-OB Diabetes Coordinator [phone] (8a-5p)

## 2020-08-24 NOTE — Progress Notes (Addendum)
Per Dr. Chase Caller, patient can have water and ensures, will continue to monitor.

## 2020-08-24 NOTE — Progress Notes (Signed)
Nutrition Follow-up  DOCUMENTATION CODES:   Severe malnutrition in context of acute illness/injury  INTERVENTION:   - RD will monitor for diet advancement and add oral nutrition supplements as appropriate  If PO intake does not improve or pt remains NPO, recommend Cortrak placement and initiation of nutrition support. Recommend: - Osmolite 1.5 @ 50 ml/hr (1200 ml/day) - ProSource TF 45 ml BID  Recommended tube feeding regimen would provide 1880 kcal, 97 grams of protein, and 914 ml of H2O.   NUTRITION DIAGNOSIS:   Severe Malnutrition related to acute illness (COVID19) as evidenced by mild fat depletion, mild muscle depletion, moderate muscle depletion, severe muscle depletion, moderate fat depletion, percent weight loss (13.3% weight loss in less than 1 month).  New diagnosis after completion of NFPE  GOAL:   Patient will meet greater than or equal to 90% of their needs  Unmet at this time  MONITOR:   Diet advancement, Labs, Weight trends, Skin, I & O's  REASON FOR ASSESSMENT:   Consult, Ventilator Enteral/tube feeding initiation and management  ASSESSMENT:   69 yo male admitted with recent COVID-19 diagnosis with worsening respiratory failure and left pneumothorax.  PMH includes DM, HTN, GERD.  9/01 - treated for COVID-19 9/18 - admitted to [facility], chest tube placed, transferred to [facility] 9/19 - intubated, left pneumothorax, second chest tube placed 9/20 - extubated 9/21 - chest tubes obstructed, new chest tube inserted 9/23 - NPO  Discussed pt with RN and during ICU rounds. Pt eating some and drank an entire carton of milk with breakfast. Per RN, pt drinking Ensure Enlive supplements as well with assistance from RN.  Spoke with pt at bedside. Pt reports he ate "a little" of breakfast. Pt endorses drinking Ensure Enlive supplements.  Spoke with MD regarding liberalizing pt's diet from heart healthy/carb modified to regular. MD to make pt NPO and obtain SLP evaluation.  If pt remains NPO or PO intake remains poor, recommend Cortrak placement and initiation of nutrition support. RD will leave tube feeding recommendations.  Admit weight: 84 kg Current weight: 78.6 kg  Pt with a 12.1 kg weight loss since 08/02/20. This is a 13.3% weight loss in less than 1 month which is severe and significant for timeframe.  Medications reviewed and include: colace, Ensure Enlive TID, SSI q 4 hours, tradjenta, protonix, miralax, prednisone  Labs reviewed. CBG's: 62-361 x 24 hours  UOP: 3655 ml x 24 hours CT: 40 ml x 24 hours I/O's: -4.2 L since admit  NUTRITION - FOCUSED PHYSICAL EXAM:    Most Recent Value  Orbital Region Mild depletion  Upper Arm Region Moderate depletion  Thoracic and Lumbar Region Unable to assess  Buccal Region Mild depletion  Temple Region Mild depletion  Clavicle Bone Region Moderate depletion  Clavicle and Acromion Bone Region Severe depletion  Scapular Bone Region Unable to assess  Dorsal Hand Mild depletion  Patellar Region Severe depletion  Anterior Thigh Region Severe depletion  Posterior Calf Region Severe depletion  Edema (RD Assessment) Mild  Hair Reviewed  Eyes Reviewed  Mouth Reviewed  Skin Reviewed  Nails Reviewed       Diet Order:   Diet Order            Diet NPO time specified Except for: Sips with Meds  Diet effective now                 EDUCATION NEEDS:   Not appropriate for education at this time  Skin:  Skin Assessment: Skin  Integrity Issues: Stage II: bilateral buttocks  Last BM:  08/23/20 type 7  Height:   Ht Readings from Last 1 Encounters:  08/19/20 5\' 10"  (1.778 m)    Weight:   Wt Readings from Last 1 Encounters:  08/24/20 78.6 kg    BMI:  Body mass index is 24.86 kg/m.  Estimated Nutritional Needs:   Kcal:  1700-1900 kcals  Protein:  95-120 grams  Fluid:  >/= 1.7 L    Gaynell Face, MS, RD, LDN Inpatient Clinical Dietitian Please see AMiON for contact  information.

## 2020-08-24 NOTE — TOC Initial Note (Signed)
Transition of Care [facility]) - Initial/Assessment Note    Patient Details  Name: Tim Day MRN: [id number] Date of Birth: 02-01-1951  Transition of [facility]) CM/SW Contact:    Verdell Carmine, RN Phone Number: 08/24/2020, 12:07 PM  Clinical Narrative:                 Patient readmitted post COVID was home on oxygen. Is now off isolation 21 days post COVID. Is weak still on high flow. Discussed with Dr Chase Caller, spouse regarding LTACH. Patient has a niece who works as a RT at [employer]	[place]. Reached out to [place] for [facility] to Evaluate patient for potential admission. PT consult placed to evaluate patient also. CM will follow for continued needs. Will use [employer] for oxygen need on discharge.   Expected Discharge Plan: Long Term Acute Care (LTAC) Barriers to Discharge: Continued Medical Work up   Patient Goals and CMS Choice        Expected Discharge Plan and Services Expected Discharge Plan: Long Term Acute Care (LTAC) In-house Referral: Clinical Social Work Discharge Planning Services: CM Consult   Living arrangements for the past 2 months: [place]                                      Prior Living Arrangements/Services Living arrangements for the past 2 months: Single Family Home Lives with:: Spouse Patient language and need for interpreter reviewed:: Yes        Need for Family Participation in Patient Care: Yes (Comment) Care giver support system in place?: Yes (comment)   Criminal Activity/Legal Involvement Pertinent to Current Situation/Hospitalization: No - Comment as needed  Activities of Daily Living      Permission Sought/Granted                  Emotional Assessment       Orientation: : Oriented to Self, Oriented to Place, Oriented to Situation Alcohol / Substance Use: Not Applicable Psych Involvement: No (comment)  Admission diagnosis:  Hyponatremia [E87.1] Pneumothorax, left [J93.9] Acute respiratory  failure with hypoxia (HCC) [J96.01] HCAP (healthcare-associated pneumonia) [J18.9] Pneumothorax [J93.9] Chest tube in place [Z96.89] COVID-19 virus infection [U07.1] Patient Active Problem List   Diagnosis Date Noted  . Primary spontaneous pneumothorax   . HCAP (healthcare-associated pneumonia)   . Pressure injury of skin 08/20/2020  . Acute respiratory failure with hypoxia ([place])   . Pneumothorax, left 08/19/2020  . Pneumonia due to COVID-19 virus 08/19/2020  . Leg DVT (deep venous thromboembolism), acute, bilateral ([place]) 08/19/2020  . Acute hypoxemic respiratory failure due to COVID-19 ([place]) 08/08/2020  . Sepsis ([place]) 08/08/2020  . Hyponatremia 08/08/2020  . Hypocalcemia 08/03/2020  . Transaminitis 08/03/2020  . COVID-19 virus infection 08/02/2020  . Thrombocytopenia ([place]) 08/02/2020  . Leukopenia 08/02/2020  . Esophageal reflux   . Chest tube in place 01/22/2015  . Hypercholesteremia   . Hypertension   . Cardiogenic shock ([place]) 01/20/2015  . DM2 (diabetes mellitus, type 2) ([place]) 01/20/2015  . Benign essential HTN 01/20/2015  . Dyslipidemia 01/20/2015   PCP:  Celene Squibb, MD Pharmacy:   [facility], [place] [address] Phone: [phone] Fax: [phone]     Social Determinants of Health (SDOH) Interventions    Readmission Risk Interventions No flowsheet data found.

## 2020-08-24 NOTE — Progress Notes (Signed)
ANTICOAGULATION CONSULT NOTE - Follow Up Consult  Pharmacy Consult for Heparin Indication: VTE Treatment   Assessment: 69 y.o. male with a history of T2DM, HTN, HLD, GERD, and recent hospitalization for covid-19 pneumonia (discharged 9/3 on 6LPM O2; completed remdesivir infusions as outpatient 9/5). Patient found to have acute bilateral DVTs with concern for PE but no definitive PE was noted on imaging. Eliquis initiated at previous admission  And continued treatment as outpatient for DVT.(discharged 9/13  The patient presented 9/19 with increasing SOB. Patients last dose of eliquis was 9/17 at 1900. Pharmacy asked to transition to heparin.   Today, the patient's aPTT was 77 and HL 0.61. Patient remains in goal after slight increase in heparin rate overnight. Per RN, no bleeding noted and no issue with line. Given time since last Eliquis dose and both aPTT and HL within goal, will proceed with monitoring HL. Will continue current rate for now.    Goal of Therapy:  Heparin level 0.3-0.7 units/ml aPTT 66-102 seconds Monitor platelets by anticoagulation protocol: Yes   Plan:  Continue heparin 1300 units/hr Follow-up daily heparin level and CBC.  Monitor for s/sx of bleeding  Thanks for allowing pharmacy to be a part of this patient's care.  Claudina Lick, PharmD PGY1 Acute Care Pharmacy Resident 08/24/2020 10:38 AM  Please check [email or web address] for unit-specific pharmacy phone numbers.

## 2020-08-24 NOTE — Progress Notes (Addendum)
NAME:  Tim Day, MRN:  [id number], DOB:  [birth year], LOS: 5 ADMISSION DATE:  08/19/2020, CONSULTATION DATE:  9/19 REFERRING MD:  Roderic Palau, CHIEF COMPLAINT:  Dyspnea   Brief History   69 year old with hx of recent COVID diagnosis presenting with worsening hypoxemic resp failure and left pneumothorax.  Past Medical History  GERD HTN DM2 A1c 7.2%  Significant Hospital Events   9/1 treated for COVID, noted to have bilateral DVT, d/c home 9/13 on O2, returned with pneumothorax 9/18 [facility] to [facility], chest tube exchanged prior to transfer; chest tube occluded, required maniuplation. 9/19 second chest tube placed. Intubated. CVL placed.  9/20 weaning; appears comfortable on spontaneous breathing trial.  Still having bloody output from both chest tubes, heparin infusion placed on hold for 8 hours.  Extubated. Late in evening oxygen requirements trending up. sats 75% in spite of high flow. XRAY w/ large PTX.. chest tubes not functioning. Lateral chest tube removed. Obstructed w/ old clot. New 28 french chest tube placed.  9/21 still short of breath. airleak 1/7 on lateral tube w/ tidal req high flow and NRB Weaning oxygen again 9/22 Consults:  n/a  Procedures:  9/18 Left chest tube 30 Fr 9/21 Left lateral 28 fr chest tube 9/21>>> 9/19: Left anterior chest tube 9/19: Left subclavian triple-lumen catheter 9/19 endotracheal tube > 9/20 Significant Diagnostic Tests:    Micro Data:  8/25 SARS cov 2 > positive 9/1 SARS cov 2 > positive 9/18 SARS cov 2> positive  9/18 Blood cx x 2>>  Antimicrobials:  9/1 remdesivir > completed course  9/7 baricitinib > 9/13, 9/18 >  Zosyn 9/18>>>9/22 Interim history/subjective:   08/24/2020 - now in 34m ICU . Trx out of covid isolation.  Not on vent. Not on pressors. Very deconditioned. On HHFNC and FAce mask.   Objective   Blood pressure 112/72, pulse 93, temperature 98.3 F (36.8 C), temperature source Axillary, resp. rate (!) 26, height 5\' 10"   (1.778 m), weight 78.6 kg, SpO2 90 %.    FiO2 (%):  [60 %-90 %] 60 %   Intake/Output Summary (Last 24 hours) at 08/24/2020 1135 Last data filed at 08/24/2020 0800 Gross per 24 hour  Intake 540.65 ml  Output 3865 ml  Net -3324.35 ml   Filed Weights   08/22/20 0500 08/23/20 0409 08/24/20 0500  Weight: 78.6 kg 77.1 kg 78.6 kg   General Appearance:  Looks deconditnoed Head:  Normocephalic, without obvious abnormality, atraumatic Eyes:  PERRL - yes, conjunctiva/corneas - muddy     Ears:  Normal external ear canals, both ears Nose:  G tube - no Throat:  ETT TUBE - no , OG tube - no. Has HHFNC and FAce mask Neck:  Supple,  No enlargement/tenderness/nodules Lungs: Clear to auscultation bilaterally, Mild to mod distress. Has LEFT CHEST TUBE Heart:  S1 and S2 normal, no murmur, CVP - x.  Pressors - x Abdomen:  Soft, no masses, no organomegaly Genitalia / Rectal:  Not done Extremities:  Extremities- intact Skin:  ntact in exposed areas . Sacral area - not examined Neurologic:  Sedation - none -> RASS - 0 . Moves all 4s - yes. CAM-ICU - neg . Orientation - x3+      Resolved Hospital Problem list   AKI resolved as of 9/21 Poissible HCAP - ending zosyn 9/22 Constipation, resolved as of 9/21  Assessment & Plan:  Acute hypoxemic respiratory failure due to COVID ARDS  -Complicated acutely by left pneumothorax  -  08/24/2020 -  on left chest tube, HHFNC and face mask  Plan  Continue baricitinib through 9/25  Steroid taper initiated  Alternate high flow oxygen and NIPPV as indicated for work of breathing Incentive spirometry IV Lasix Mobilization Continue systemic IV heparin Pulse oximetry  Left pneumothorax, chest tube clotting with blood, has very small right pneumothorax which is new as of 9/22 -chest tubes obstructed again on 9/21. New lateral 28 fr inserted. PTX resolved.  -Airleak: Intermittent on the left side.. s/p removal of apical chest tube - CXR 9/23 - no  ptx   Plan Continue left lateral chest tube to 40 cm suction CXR daily  DM2 with hyperglycemia-glycemic control better/ actually now lower.  Plan Discontinue Levemir Continue sliding scale insulin Continue Tradjenta  BL LE DVT Plan Continue IV heparin   Plan Laxative of choice Best practice:  Diet: start tube feeding-->stopped 9/21. Keep NPO except meds ()looks very weak) -> get swallo weval Pain/Anxiety/Delirium protocol (if indicated): stopped 9/21 VAP protocol (if indicated): stopped 9/21 DVT prophylaxis: heparin infusion GI prophylaxis: famotidine Glucose control: SSI Mobility: bed rest Code Status: full Family Communication - wife Inez Catalina updated over phone  Disposition: Keep in the intensive care. LTAC consult requested    ATTESTATION & SIGNATURE   The patient Tim Day is critically ill with multiple organ systems failure and requires high complexity decision making for assessment and support, frequent evaluation and titration of therapies, application of advanced monitoring technologies and extensive interpretation of multiple databases.   Critical Care Time devoted to patient care services described in this note is  35  Minutes. This time reflects time of care of this signee Dr Brand Males. This critical care time does not reflect procedure time, or teaching time or supervisory time of PA/NP/Med student/Med Resident etc but could involve care discussion time     Dr. Brand Males, M.D., [facility].C.P Pulmonary and Critical Care Medicine Staff Physician [facility] [facility] Pager: [phone], If no answer or between  15:00h - 7:00h: call [phone]  08/24/2020 11:58 AM     LABS    PULMONARY Recent Labs  Lab 08/19/20 1010 08/20/20 0442 08/20/20 1236  PHART  --  7.499* 7.287*  PCO2ART  --  27.1* 52.5*  PO2ART  --  57* 101  HCO3 21.2 21.3 25.1  TCO2  --  22 27  O2SAT 74.9 93.0 97.0     CBC Recent Labs  Lab 08/22/20 0553 08/23/20 0400 08/24/20 0423  HGB 13.0 12.8* 12.5*  HCT 39.0 39.7 38.0*  WBC 10.0 11.4* 10.5  PLT 176 166 174    COAGULATION No results for input(s): INR in the last 168 hours.  CARDIAC  No results for input(s): TROPONINI in the last 168 hours. No results for input(s): PROBNP in the last 168 hours.   CHEMISTRY Recent Labs  Lab 08/20/20 0500 08/20/20 0500 08/20/20 1236 08/20/20 1236 08/21/20 0408 08/21/20 0408 08/22/20 0553 08/22/20 0553 08/23/20 0400 08/24/20 0423  NA 137   < > 136  --  139  --  139  --  142 136  K 4.3   < > 4.7   < > 4.6   < > 4.3   < > 4.0 4.1  CL 103  --   --   --  106  --  107  --  104 96*  CO2 23  --   --   --  23  --  25  --  30 32  GLUCOSE 311*  --   --   --  278*  --  101*  --  117* 62*  BUN 52*  --   --   --  61*  --  41*  --  45* 43*  CREATININE 1.33*  --   --   --  1.26*  --  0.83  --  0.89 0.83  CALCIUM 8.0*  --   --   --  7.9*  --  7.7*  --  7.9* 8.0*  MG 2.8*  --   --   --  3.1*  --   --   --   --   --   PHOS 6.4*  --   --   --  4.2  --   --   --   --   --    < > = values in this interval not displayed.   Estimated Creatinine Clearance: 89.2 mL/min (by C-G formula based on SCr of 0.83 mg/dL).   LIVER Recent Labs  Lab 08/19/20 0730 08/21/20 0408 08/22/20 0553 08/23/20 0400 08/24/20 0423  AST 27 39 45* 47* 28  ALT 36 82* 91* 109* 87*  ALKPHOS 81 70 79 75 67  BILITOT 1.5* 0.8 0.7 1.0 0.6  PROT 7.3 5.6* 5.2* 5.6* 5.2*  ALBUMIN 3.1* 1.9* 1.9* 2.0* 1.9*     INFECTIOUS Recent Labs  Lab 08/19/20 1010 08/19/20 1016 08/19/20 1213 08/19/20 1954 08/20/20 0500 08/21/20 0408  LATICACIDVEN 2.6*  --  2.2*  --   --   --   PROCALCITON  --    < >  --  0.22 0.36 0.33   < > = values in this interval not displayed.     ENDOCRINE CBG (last 3)  Recent Labs    08/24/20 0607 08/24/20 0731 08/24/20 1132  GLUCAP 122* 62* 124*         IMAGING x48h  - image(s) personally visualized   -   highlighted in bold DG Chest Port 1 View  Result Date: 08/24/2020 CLINICAL DATA:  Shortness of breath. EXAM: PORTABLE CHEST 1 VIEW COMPARISON:  08/23/2020. FINDINGS: Left subclavian line and left chest tube in stable position. No pneumothorax on the left. Stable tiny right apical pneumothorax again noted. Stable cardiomegaly. Low lung volumes with persistent bibasilar infiltrates. No interim change. No pleural effusion. Degenerative change thoracic spine. 8 mm circum linear calcification noted over the left upper quadrant. This could represent a renal artery aneurysm. IMPRESSION: 1. Left subclavian line and left chest tube in stable position. No pneumothorax on the left. Stable tiny right apical pneumothorax again noted. 2.  Stable cardiomegaly. 3. Low lung volumes with persistent bibasilar atelectasis. No interim change. 4. 8 mm circum linear calcification noted over the left upper quadrant. This could represent a renal artery aneurysm. Electronically Signed   By: Marcello Moores  Register   On: 08/24/2020 06:24   DG Chest Port 1 View  Result Date: 08/23/2020 CLINICAL DATA:  COVID positive. Pneumothorax. EXAM: PORTABLE CHEST 1 VIEW COMPARISON:  August 23, 2020 (5:42 a.m.) FINDINGS: The left-sided pigtail chest tube seen on the prior study has been removed. The additional left-sided chest tube seen on the prior study is stable in position. Stable left subclavian venous catheter positioning is also noted. Chronic appearing increased lung markings are noted with stable mild to moderate severity bibasilar atelectasis and/or infiltrate. There is no evidence of a pleural effusion. A small, stable right apical pneumothorax is seen. The cardiac silhouette is  mildly enlarged and unchanged in size. Multilevel degenerative changes are noted throughout the thoracic spine. IMPRESSION: 1. Interval removal of left-sided chest tube. 2. Stable small right apical pneumothorax. 3. Stable mild to moderate severity bibasilar  atelectasis and/or infiltrate. Electronically Signed   By: Virgina Norfolk M.D.   On: 08/23/2020 16:40   DG Chest Port 1 View  Result Date: 08/23/2020 CLINICAL DATA:  Pneumothorax after biopsy. EXAM: PORTABLE CHEST 1 VIEW COMPARISON:  August 22, 2020. FINDINGS: Stable cardiomediastinal silhouette. Two left-sided chest tubes are noted without pneumothorax seen on the left. Possible small right apical pneumothorax may be present; attention on follow-up radiographs is recommended. Stable bibasilar atelectasis, edema or infiltrates are noted. Small pleural effusions are noted. Left subclavian catheter is unchanged in position. Bony thorax is unremarkable. IMPRESSION: Two left-sided chest tubes are noted without pneumothorax seen on the left. Possible small right apical pneumothorax may be present; attention on follow-up radiographs is recommended. Stable bibasilar atelectasis, edema or infiltrates are noted. Small pleural effusions are noted. Electronically Signed   By: Marijo Conception M.D.   On: 08/23/2020 08:08

## 2020-08-25 ENCOUNTER — Inpatient Hospital Stay (HOSPITAL_COMMUNITY): Payer: 59

## 2020-08-25 DIAGNOSIS — E43 Unspecified severe protein-calorie malnutrition: Secondary | ICD-10-CM | POA: Insufficient documentation

## 2020-08-25 LAB — CBC
HCT: 37.2 % — ABNORMAL LOW (ref 39.0–52.0)
Hemoglobin: 12.2 g/dL — ABNORMAL LOW (ref 13.0–17.0)
MCH: 29.3 pg (ref 26.0–34.0)
MCHC: 32.8 g/dL (ref 30.0–36.0)
MCV: 89.2 fL (ref 80.0–100.0)
Platelets: 167 10*3/uL (ref 150–400)
RBC: 4.17 MIL/uL — ABNORMAL LOW (ref 4.22–5.81)
RDW: 13.2 % (ref 11.5–15.5)
WBC: 11.8 10*3/uL — ABNORMAL HIGH (ref 4.0–10.5)
nRBC: 0 % (ref 0.0–0.2)

## 2020-08-25 LAB — COMPREHENSIVE METABOLIC PANEL
ALT: 68 U/L — ABNORMAL HIGH (ref 0–44)
AST: 25 U/L (ref 15–41)
Albumin: 2 g/dL — ABNORMAL LOW (ref 3.5–5.0)
Alkaline Phosphatase: 58 U/L (ref 38–126)
Anion gap: 8 (ref 5–15)
BUN: 31 mg/dL — ABNORMAL HIGH (ref 8–23)
CO2: 33 mmol/L — ABNORMAL HIGH (ref 22–32)
Calcium: 8.2 mg/dL — ABNORMAL LOW (ref 8.9–10.3)
Chloride: 93 mmol/L — ABNORMAL LOW (ref 98–111)
Creatinine, Ser: 0.65 mg/dL (ref 0.61–1.24)
GFR calc Af Amer: >60 mL/min (ref >60)
GFR calc non Af Amer: >60 mL/min (ref >60)
Glucose, Bld: 80 mg/dL (ref 70–99)
Potassium: 4.8 mmol/L (ref 3.5–5.1)
Sodium: 134 mmol/L — ABNORMAL LOW (ref 135–145)
Total Bilirubin: 0.7 mg/dL (ref 0.3–1.2)
Total Protein: 5 g/dL — ABNORMAL LOW (ref 6.5–8.1)

## 2020-08-25 LAB — GLUCOSE, CAPILLARY
Glucose-Capillary: 89 mg/dL (ref 70–99)
Glucose-Capillary: 85 mg/dL (ref 70–99)
Glucose-Capillary: 308 mg/dL — ABNORMAL HIGH (ref 70–99)
Glucose-Capillary: 275 mg/dL — ABNORMAL HIGH (ref 70–99)
Glucose-Capillary: 231 mg/dL — ABNORMAL HIGH (ref 70–99)

## 2020-08-25 LAB — HEPARIN LEVEL (UNFRACTIONATED): Heparin Unfractionated: 0.66 IU/mL (ref 0.30–0.70)

## 2020-08-25 LAB — PHOSPHORUS
Phosphorus: 2.7 mg/dL (ref 2.5–4.6)
Phosphorus: 1.9 mg/dL — ABNORMAL LOW (ref 2.5–4.6)
Phosphorus: 2.5 mg/dL (ref 2.5–4.6)

## 2020-08-25 LAB — LACTIC ACID, PLASMA: Lactic Acid, Venous: 0.7 mmol/L (ref 0.5–1.9)

## 2020-08-25 LAB — MAGNESIUM
Magnesium: 2.4 mg/dL (ref 1.7–2.4)
Magnesium: 2.1 mg/dL (ref 1.7–2.4)
Magnesium: 2.1 mg/dL (ref 1.7–2.4)

## 2020-08-25 LAB — APTT: aPTT: 91 seconds — ABNORMAL HIGH (ref 24–36)

## 2020-08-25 LAB — TROPONIN I (HIGH SENSITIVITY)
Troponin I (High Sensitivity): 184 ng/L (ref <18)
Troponin I (High Sensitivity): 191 ng/L (ref <18)

## 2020-08-25 MED ORDER — PANTOPRAZOLE SODIUM 40 MG PO TBEC
40.0000 mg | DELAYED_RELEASE_TABLET | Freq: Every day | ORAL | Status: DC
  Filled 2020-08-25: qty 1

## 2020-08-25 MED ORDER — PREDNISONE 10 MG PO TABS
10.0000 mg | ORAL_TABLET | Freq: Every day | ORAL | Status: AC
  Administered 2020-08-30: 10 mg via ORAL
  Filled 2020-08-25: qty 1

## 2020-08-25 MED ORDER — PREDNISONE 10 MG PO TABS: 40.0000 mg | ORAL_TABLET | Freq: Every day | ORAL | Status: DC

## 2020-08-25 MED ORDER — PREDNISONE 10 MG PO TABS: 30.0000 mg | ORAL_TABLET | Freq: Every day | ORAL | Status: DC

## 2020-08-25 MED ORDER — PREDNISONE 10 MG PO TABS: 10.0000 mg | ORAL_TABLET | Freq: Every day | ORAL | Status: DC

## 2020-08-25 MED ORDER — PREDNISONE 5 MG PO TABS
5.0000 mg | ORAL_TABLET | Freq: Every day | ORAL | Status: AC
  Administered 2020-08-31: 5 mg via ORAL
  Filled 2020-08-25: qty 1

## 2020-08-25 MED ORDER — PREDNISONE 20 MG PO TABS
30.0000 mg | ORAL_TABLET | Freq: Every day | ORAL | Status: AC
  Administered 2020-08-26 – 2020-08-27 (×2): 30 mg via ORAL
  Filled 2020-08-25 (×2): qty 1

## 2020-08-25 MED ORDER — PROSOURCE TF PO LIQD
45.0000 mL | Freq: Two times a day (BID) | ORAL | Status: DC
  Administered 2020-08-25 – 2020-08-30 (×9): 45 mL
  Filled 2020-08-25 (×10): qty 45

## 2020-08-25 MED ORDER — PANTOPRAZOLE SODIUM 40 MG PO PACK
40.0000 mg | PACK | Freq: Every day | ORAL | Status: DC
  Administered 2020-08-25 – 2020-09-10 (×16): 40 mg
  Filled 2020-08-25 (×18): qty 20

## 2020-08-25 MED ORDER — PREDNISONE 10 MG PO TABS: 20.0000 mg | ORAL_TABLET | Freq: Every day | ORAL | Status: DC

## 2020-08-25 MED ORDER — PREDNISONE 20 MG PO TABS
20.0000 mg | ORAL_TABLET | Freq: Every day | ORAL | Status: AC
  Administered 2020-08-28 – 2020-08-29 (×2): 20 mg via ORAL
  Filled 2020-08-25 (×2): qty 1

## 2020-08-25 MED ORDER — OSMOLITE 1.5 CAL PO LIQD
1000.0000 mL | ORAL | Status: DC
  Administered 2020-08-25 – 2020-08-27 (×2): 1000 mL
  Filled 2020-08-25 (×4): qty 1000

## 2020-08-25 NOTE — Procedures (Signed)
Cortrak  Person Inserting Tube:  Esaw Dace, RD Tube Type:  Cortrak - 43 inches Tube Location:  Right nare Initial Placement:  Stomach Secured by: Bridle Technique Used to Measure Tube Placement:  Documented cm marking at nare/ corner of mouth Cortrak Secured At:  73 cm    Cortrak Tube Team Note:  Consult received to place a Cortrak feeding tube.   No x-ray is required. RN may begin using tube.   If the tube becomes dislodged please keep the tube and contact the Cortrak team at [email or web address] ([email or web address]) for replacement.  If after hours and replacement cannot be delayed, place a NG tube and confirm placement with an abdominal x-ray.    Kerman Passey MS, RDN, LDN, CNSC Registered Dietitian III Clinical Nutrition RD Pager and On-Call Pager Number Located in [place]

## 2020-08-25 NOTE — Progress Notes (Signed)
Rehab Admissions Coordinator Note:  Patient was screened by Cleatrice Burke for appropriateness for an Inpatient Acute Rehab Consult per therapy recs. Noted HFNC at 35 L and NRB. Await further progress with weaning O2 before proceeding with rehab consult. We will follow.  Cleatrice Burke RN MSN 08/25/2020, 2:05 PM  I can be reached at [phone].
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
## 2020-08-25 NOTE — Progress Notes (Signed)
eLink Physician-Brief Progress Note Patient Name: Tim Day DOB: 1951/06/14 MRN: [id number]   Date of Service  08/25/2020  HPI/Events of Note  EKG with inferior flipped T's and a Troponin of 184, patient is asymptomatic. There is poor R wave progression on thee EKG as well.  eICU Interventions  Repeat EKG with attention to lead placement, continue to cycle Troponin, cardiology consultation.        [name] 08/25/2020, 2:19 AM

## 2020-08-25 NOTE — Progress Notes (Addendum)
Nutrition Follow-up  DOCUMENTATION CODES:   Severe malnutrition in context of acute illness/injury  INTERVENTION:   When Cortrak is placed, begin TF: Osmolite 1.5 @ 50 ml/hr (1200 ml/day) ProSource TF 45 ml BID  Provides 1880 kcal, 97 grams of protein, and 914 ml of H2O.   Monitor magnesium, potassium, and phosphorus levels, MD to replete as needed, as pt is at risk for refeeding syndrome given severe malnutrition with recent minimal intake.   NUTRITION DIAGNOSIS:   Severe Malnutrition related to acute illness (COVID19) as evidenced by mild fat depletion, mild muscle depletion, moderate muscle depletion, severe muscle depletion, moderate fat depletion, percent weight loss (13.3% weight loss in less than 1 month).  Ongoing  GOAL:   Patient will meet greater than or equal to 90% of their needs  Progressing  MONITOR:   TF tolerance, PO intake, Labs, Skin  REASON FOR ASSESSMENT:   Consult, Ventilator Enteral/tube feeding initiation and management  ASSESSMENT:   69 yo male admitted with recent COVID-19 diagnosis with worsening respiratory failure and left pneumothorax.  PMH includes DM, HTN, GERD.  9/01 - treated for COVID-19 9/18 - admitted to [facility], chest tube placed, [facility] to [facility] 9/19 - intubated, left pneumothorax, second chest tube placed 9/20 - extubated 9/21 - chest tubes obstructed, new chest tube inserted 9/23 - NPO  S/P SLP evaluation this morning. Diet has been advanced to full liquids.  Intake will likely be inadequate due to lethargy and deconditioning. Plans for Cortrak placement, RD to order TF.  Admit weight: 84 kg Current weight: 80.5 kg  Medications reviewed and include: novoLOG SSI q 4 hours, tradjenta, protonix, prednisone.  Labs reviewed. Sodium 134 CBG: 85-308  UOP: 1,838 ml x 24 hours CT: 0 ml output I/O's: -5.8 L since admit   Diet Order:   Diet Order            Diet full liquid Room service appropriate? No; Fluid  consistency: Thin  Diet effective now                 EDUCATION NEEDS:   Not appropriate for education at this time  Skin:  Skin Assessment: Skin Integrity Issues: Stage II: bilateral buttocks  Last BM:  08/23/20 type 7  Height:   Ht Readings from Last 1 Encounters:  08/19/20 5\' 10"  (1.778 m)    Weight:   Wt Readings from Last 1 Encounters:  08/25/20 80.5 kg    BMI:  Body mass index is 25.46 kg/m.  Estimated Nutritional Needs:   Kcal:  1700-1900 kcals  Protein:  95-120 grams  Fluid:  >/= 1.7 L   Lucas Mallow, RD, LDN, CNSC Please refer to Amion for contact information.

## 2020-08-25 NOTE — Progress Notes (Signed)
    The patient was seen earlier this morning by our service for questionable EKG changes.  No acute cardiac complaints against the backdrop of his acute COVID illness.  Trop trend flat.  We will follow.  No acute intervention needed and no change from suggestions earlier this morning.
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
## 2020-08-25 NOTE — Progress Notes (Signed)
Inpatient Diabetes Program Recommendations  AACE/ADA: New Consensus Statement on Inpatient Glycemic Control (2015)  Target Ranges:  Prepandial:   less than 140 mg/dL      Peak postprandial:   less than 180 mg/dL (1-2 hours)      Critically ill patients:  140 - 180 mg/dL   Lab Results  Component Value Date   GLUCAP 308 (H) 08/25/2020   HGBA1C 7.2 (H) 08/08/2020    Review of Glycemic Control Results for ARAF, CLUGSTON (MRN [id number]) as of 08/25/2020 15:00  Ref. Range 08/25/2020 07:21 08/25/2020 11:11  Glucose-Capillary Latest Ref Range: 70 - 99 mg/dL 85 308 (H)    Inpatient Diabetes Program Recommendations:     Novolog 3 units tid with meals if eats at least 50% of meal  Will continue to follow while inpatient.  Thank you, Reche Dixon, RN, BSN Diabetes Coordinator Inpatient Diabetes Program [phone] (team pager from 8a-5p)

## 2020-08-25 NOTE — Progress Notes (Signed)
NAME:  Tim Day, MRN:  [id number], DOB:  02-21-1951, LOS: 6 ADMISSION DATE:  08/19/2020, CONSULTATION DATE:  9/19 REFERRING MD:  Roderic Palau, CHIEF COMPLAINT:  Dyspnea   Brief History   69 year old with hx of recent COVID diagnosis presenting with worsening hypoxemic resp failure and left pneumothorax.  Past Medical History  GERD HTN DM2 A1c 7.2% Has hx of DVT  09/01/2008 - Occlusive thrombus isolated to a segment of a duplicated brachial  vein in the upper arm.  12/7/20009 - Persistent thrombus in the left brachial vein.  - 12/14/2008 - Small residual thrombus in a duplicate left brachial vein.  - 12/28/2008 - thrombus in the duplicated left brachial  vein -08/09/20 - bilateral LE DVt - Started on eliquis   has a past medical history of Acute respiratory disease, Atypical mole (12/30/2012), Candidiasis of urogenital sites, Diabetes mellitus, Diffuse myofascial pain syndrome, Esophageal reflux, Hypertension, Impacted cerumen of right ear, Melanoma ([place]) (06/14/2011), Mixed hyperlipidemia, MM (malignant melanoma of skin) ([place]) (07/01/2017), Primary insomnia, Seborrheic dermatitis, unspecified, Squamous cell carcinoma of skin (06/14/2011), and Thrombocytopenia, unspecified ([place]).   has a past surgical history that includes Shoulder surgery (2008); Ankle fracture surgery; Abdominal exploration surgery; Colonoscopy (07/13/2012); and left heart catheterization with coronary angiogram (N/A, 01/20/2015).   Significant Hospital Events   9/1 treated for COVID - [place] 9/3 - discharged on 6L [place] xxx 9/4 and 9/5 - decadron and remdesivir opd xxxx  9/7- readmitted with worsening hypoxemia and pneumomediastinum 9/8 -  noted to have bilateral DVT,  9/13 - d/c home 9/13 on O2,  xxx 9/18 - returned with pneumothorax -> 50L - 100% HFNC  9/18 [facility], chest tube exchanged prior to transfer; chest tube occluded, required maniuplation.  9/19 second chest tube placed. Intubated. CVL  placed.   9/20 weaning; appears comfortable on spontaneous breathing trial.  Still having bloody output from both chest tubes, heparin infusion placed on hold for 8 hours.  Extubated. Late in evening oxygen requirements trending up. sats 75% in spite of high flow. XRAY w/ large PTX.. chest tubes not functioning. Lateral chest tube removed. Obstructed w/ old clot. New 28 french chest tube placed.   9/21 still short of breath. airleak 1/7 on lateral tube w/ tidal req high flow and NRB Weaning oxygen again 9/22  9/23 - now in 62m ICU . Trx out of covid isolation.  Not on vent. Not on pressors. Very deconditioned. On HHFNC and FAce mask.   Consults:  n/a  Procedures:  9/18 Left chest tube 30 Fr 9/21 Left lateral 28 fr chest tube 9/21>>> 9/19: Left anterior chest tube 9/19: Left subclavian triple-lumen catheter 9/19 endotracheal tube > 9/20 Significant Diagnostic Tests:    Micro Data:  8/25 SARS cov 2 > positive 9/1 SARS cov 2 > positive 9/18 SARS cov 2> positive  9/18 Blood cx x 2>>  Antimicrobials:/Anti covid  9/1 remdesivir > completed course  9/7 baricitinib > 9/13, 9/18 >9/24 (finished 9/24)  9?18 Solumedrol > 9/20, Prednisone 50mg  9/21 >> Zosyn 9/18>>>9/22  Interim history/subjective:   08/25/2020 - seen by speech. Approved for full liquid diet but need to eat slow and sitting. Solids not recommended. Nutn reecommending cortrak.  CArds called due to slight bump in troponin overnight. -> EKG considered crhonic and trop flat. Last echo 2019. Has LEft chest tube x 1. On 35L HHFNC at 80% and NRB. PEr RN - neede NT suction x 1 only.   On IV heparin gtt  for DVT (was on eliqiuis prior to admit)  Overall deconditioned byt maybe a bit better  Objective   Blood pressure 140/72, pulse 88, temperature (!) 97.5 F (36.4 C), temperature source Oral, resp. rate (!) 30, height 5\' 10"  (1.778 m), weight 80.5 kg, SpO2 (!) 89 %.    FiO2 (%):  [60 %-100 %] 80 %   Intake/Output Summary (Last  24 hours) at 08/25/2020 1049 Last data filed at 08/25/2020 0933 Gross per 24 hour  Intake 280.79 ml  Output 1672 ml  Net -1391.21 ml   Filed Weights   08/23/20 0409 08/24/20 0500 08/25/20 0500  Weight: 77.1 kg 78.6 kg 80.5 kg     General Appearance:  Looks criticall ill chronic. deconditioned Head:  Normocephalic, without obvious abnormality, atraumatic Eyes:  PERRL - yes, conjunctiva/corneas - muddy     Ears:  Normal external ear canals, both ears Nose:  G tube - no Throat:  ETT TUBE - no , OG tube - no. HHFNC and FACE MASK Neck:  Supple,  No enlargement/tenderness/nodules Lungs: Clear to auscultation bilaterall  anteriroly . LEFT CHEST TUBE Heart:  S1 and S2 normal, no murmur, CVP - x.  Pressors - x Abdomen:  Soft, no masses, no organomegaly Genitalia / Rectal:  Not done Extremities:  Extremities- intact Skin:  ntact in exposed areas . Sacral area - not examined Neurologic:  Sedation - none -> RASS - +1 . Moves all 4s - yes. CAM-ICU - neg . Orientation - x3      Resolved Hospital Problem list   AKI resolved as of 9/21 Poissible HCAP - ending zosyn 9/22 Constipation, resolved as of 9/21  Assessment & Plan:  Acute hypoxemic respiratory failure due to COVID ARDS  -Complicated acutely by left pneumothorax  -  08/25/2020 - on left chest tube, HHFNC 35L 80% and face mask NRB  Plan  Continue baricitinib through 9/24 Steroid taper initiated  - to taper and stop 1 week from 08/25/20 (d/w Pharmacy) Alternate high flow oxygen and NIPPV as indicated for work of breathing Incentive spirometry Mobilization Ideal pulse ox goal > 88%  Left pneumothorax, chest tube clotting with blood, has very small right pneumothorax which is new as of 9/22 -chest tubes obstructed again on 9/21. New lateral 28 fr inserted. PTX resolved.  -Airleak: Intermittent on the left side.. s/p removal of apical chest tube - CXR 9/24 - no ptx   Plan Continue left lateral chest tube to 40 cm suction CXR  daily -    BL LE DVT 08/09/20 (has hx of UE DVT 2009- 2011)  - diagnosed in setting of covid  Plan Continue IV heparin Transition to eliquids when stable / able to take po -> probabl needs life long anticoagulation due to priro hx of UE DVT  - duration of anticoagulation needs to be sorted as opd  Concern for EKG changes 9/24  And hx of chest pain. Trop 184- 191 (9 earluer n sept)  - reassured by cards 08/25/20  Plan  get echo   DM2 with hyperglycemia-glycemic control better/ actually now lower.  Plan Discontinue Levemir Continue sliding scale insulin Continue Tradjenta     Best practice:  Diet: start tube feeding-->stopped 9/21. OK for liquid diet with crushed meds only 08/25/20 Pain/Anxiety/Delirium protocol (if indicated): stopped 9/21 VAP protocol (if indicated): stopped 9/21 DVT prophylaxis: heparin infusion -> eliquis when stable to take po meds without crushing GI prophylaxis:  ppi Glucose control: SSI Mobility: bed rest Code Status: full Family  Communication - wife Inez Catalina updated over phone  Disposition: LTAC consult requested 9/23. Move to Progressive 08/25/20 and TRH MD to assumer primary 08/26/20 - ccm will see for chest tube 2-3x/week  - d.w Dr Ovid Curd      ATTESTATION & SIGNATURE     Dr. Brand Males, M.D., [facility].C.P Pulmonary and Critical Care Medicine Staff Physician [facility] Pager: [phone], If no answer or between  15:00h - 7:00h: call [phone]  08/25/2020 11:01 AM     LABS    PULMONARY Recent Labs  Lab 08/19/20 1010 08/20/20 0442 08/20/20 1236  PHART  --  7.499* 7.287*  PCO2ART  --  27.1* 52.5*  PO2ART  --  57* 101  HCO3 21.2 21.3 25.1  TCO2  --  22 27  O2SAT 74.9 93.0 97.0    CBC Recent Labs  Lab 08/23/20 0400 08/24/20 0423 08/25/20 0041  HGB 12.8* 12.5* 12.2*  HCT 39.7 38.0* 37.2*  WBC 11.4* 10.5 11.8*  PLT 166 174 167    COAGULATION No results for  input(s): INR in the last 168 hours.  CARDIAC  No results for input(s): TROPONINI in the last 168 hours. No results for input(s): PROBNP in the last 168 hours.   CHEMISTRY Recent Labs  Lab 08/20/20 0500 08/20/20 1236 08/21/20 0408 08/21/20 0408 08/22/20 0553 08/22/20 0553 08/23/20 0400 08/23/20 0400 08/24/20 0423 08/25/20 0533  NA 137   < > 139  --  139  --  142  --  136 134*  K 4.3   < > 4.6   < > 4.3   < > 4.0   < > 4.1 4.8  CL 103  --  106  --  107  --  104  --  96* 93*  CO2 23  --  23  --  25  --  30  --  32 33*  GLUCOSE 311*  --  278*  --  101*  --  117*  --  62* 80  BUN 52*  --  61*  --  41*  --  45*  --  43* 31*  CREATININE 1.33*  --  1.26*  --  0.83  --  0.89  --  0.83 0.65  CALCIUM 8.0*  --  7.9*  --  7.7*  --  7.9*  --  8.0* 8.2*  MG 2.8*  --  3.1*  --   --   --   --   --   --  2.4  PHOS 6.4*  --  4.2  --   --   --   --   --   --  2.7   < > = values in this interval not displayed.   Estimated Creatinine Clearance: 92.5 mL/min (by C-G formula based on SCr of 0.65 mg/dL).   LIVER Recent Labs  Lab 08/21/20 0408 08/22/20 0553 08/23/20 0400 08/24/20 0423 08/25/20 0533  AST 39 45* 47* 28 25  ALT 82* 91* 109* 87* 68*  ALKPHOS 70 79 75 67 58  BILITOT 0.8 0.7 1.0 0.6 0.7  PROT 5.6* 5.2* 5.6* 5.2* 5.0*  ALBUMIN 1.9* 1.9* 2.0* 1.9* 2.0*     INFECTIOUS Recent Labs  Lab 08/19/20 1010 08/19/20 1016 08/19/20 1213 08/19/20 1954 08/20/20 0500 08/21/20 0408 08/25/20 0533  LATICACIDVEN 2.6*  --  2.2*  --   --   --  0.7  PROCALCITON  --    < >  --  0.22 0.36 0.33  --    < > = values in this interval not displayed.     ENDOCRINE CBG (last 3)  Recent Labs    08/24/20 2327 08/25/20 0316 08/25/20 0721  GLUCAP 165* 89 85         IMAGING x48h  - image(s) personally visualized  -   highlighted in bold DG CHEST PORT 1 VIEW  Result Date: 08/25/2020 CLINICAL DATA:  COVID-19 pneumonia EXAM: PORTABLE CHEST 1 VIEW COMPARISON:  08/24/2020 FINDINGS: Lung  volumes are extremely small, but are symmetric, and are stable since prior examination. Left subclavian central venous catheter with its tip at the superior cavoatrial junction and left large bore chest tube are unchanged. Patchy bibasilar pulmonary infiltrates are stable. No pneumothorax or pleural effusion. Cardiac size within normal limits. Pulmonary vascularity is normal. IMPRESSION: Stable examination with pulmonary hypoinflation and bibasilar pulmonary infiltrates. No pneumothorax. Electronically Signed   By: Fidela Salisbury MD   On: 08/25/2020 06:51   DG Chest Port 1 View  Result Date: 08/24/2020 CLINICAL DATA:  Shortness of breath. EXAM: PORTABLE CHEST 1 VIEW COMPARISON:  08/23/2020. FINDINGS: Left subclavian line and left chest tube in stable position. No pneumothorax on the left. Stable tiny right apical pneumothorax again noted. Stable cardiomegaly. Low lung volumes with persistent bibasilar infiltrates. No interim change. No pleural effusion. Degenerative change thoracic spine. 8 mm circum linear calcification noted over the left upper quadrant. This could represent a renal artery aneurysm. IMPRESSION: 1. Left subclavian line and left chest tube in stable position. No pneumothorax on the left. Stable tiny right apical pneumothorax again noted. 2.  Stable cardiomegaly. 3. Low lung volumes with persistent bibasilar atelectasis. No interim change. 4. 8 mm circum linear calcification noted over the left upper quadrant. This could represent a renal artery aneurysm. Electronically Signed   By: Marcello Moores  Register   On: 08/24/2020 06:24   DG Chest Port 1 View  Result Date: 08/23/2020 CLINICAL DATA:  COVID positive. Pneumothorax. EXAM: PORTABLE CHEST 1 VIEW COMPARISON:  August 23, 2020 (5:42 a.m.) FINDINGS: The left-sided pigtail chest tube seen on the prior study has been removed. The additional left-sided chest tube seen on the prior study is stable in position. Stable left subclavian venous catheter  positioning is also noted. Chronic appearing increased lung markings are noted with stable mild to moderate severity bibasilar atelectasis and/or infiltrate. There is no evidence of a pleural effusion. A small, stable right apical pneumothorax is seen. The cardiac silhouette is mildly enlarged and unchanged in size. Multilevel degenerative changes are noted throughout the thoracic spine. IMPRESSION: 1. Interval removal of left-sided chest tube. 2. Stable small right apical pneumothorax. 3. Stable mild to moderate severity bibasilar atelectasis and/or infiltrate. Electronically Signed   By: Virgina Norfolk M.D.   On: 08/23/2020 16:40

## 2020-08-25 NOTE — TOC Progression Note (Signed)
Transition of Care [facility]) - Progression Note    Patient Details  Name: Tim Day MRN: [id number] Date of Birth: 07-16-1951  Transition of Care [facility]) CM/SW [place], RN Phone Number: 08/25/2020, 2:10 PM  Clinical Narrative:    Seen by [facility] not deemed appropriate, PT recommended CIR, still on too much oxygenation, weaning slowly.  May need SNF at next level.  Eating liquids and tolerating, discussed nutritional status in rounds today, needs more calories for healing, placing cortrack for  increased calories  and protein.  Will continue to follow for needs.    Expected Discharge Plan: [place] Barriers to Discharge: Continued Medical Work up  Expected Discharge Plan and Services Expected Discharge Plan: [place] In-house Referral: Clinical Social Work Discharge Planning Services: CM Consult   Living arrangements for the past 2 months: Single Family Home                                       Social Determinants of Health (SDOH) Interventions    Readmission Risk Interventions No flowsheet data found.

## 2020-08-25 NOTE — Progress Notes (Signed)
ANTICOAGULATION CONSULT NOTE - Follow Up Consult  Pharmacy Consult for Heparin Indication: VTE Treatment   Assessment: 69 y.o. male with a history of T2DM, HTN, HLD, GERD, and recent hospitalization for covid-19 pneumonia (discharged 9/3 on 6LPM O2; completed remdesivir infusions as outpatient 9/5). Patient found to have acute bilateral DVTs with concern for PE but no definitive PE was noted on imaging. Eliquis initiated at previous admission  And continued treatment as outpatient for DVT.(discharged 9/13  The patient presented 9/19 with increasing SOB. Patients last dose of eliquis was 9/17 at 1900. Pharmacy asked to transition to heparin.   Today, remains therapeutic with HL 0.66. H/H stable at 12.2/37.2, Plt 167. No overt bleeding or issue with line noted. Plan to continue current regimen.    Goal of Therapy:  Heparin level 0.3-0.7 units/ml aPTT 66-102 seconds Monitor platelets by anticoagulation protocol: Yes   Plan:  Continue heparin 1300 units/hr Follow-up daily heparin level and CBC.  Monitor for s/sx of bleeding  Thanks for allowing pharmacy to be a part of this patient's care.  Claudina Lick, PharmD PGY1 Acute Care Pharmacy Resident 08/25/2020 9:19 AM  Please check [email or web address] for unit-specific pharmacy phone numbers.

## 2020-08-25 NOTE — Progress Notes (Signed)
Patients monitor ringing out "ST elevation". EKG done by this RN. EKG unable to transmit so was faxed by the unit secretary. Dr. Lucile Shutters aware, labs ordered. Will continue to monitor.

## 2020-08-25 NOTE — Progress Notes (Signed)
eLink Physician-Brief Progress Note Patient Name: Tim Day DOB: Aug 29, 1951 MRN: [id number]   Date of Service  08/25/2020  HPI/Events of Note  Concern about abnormal EKG.  eICU Interventions  Repeat EKG, cycle Troponin.        Kerry Kass Jomaira Darr 08/25/2020, 12:09 AM

## 2020-08-25 NOTE — Evaluation (Addendum)
Physical Therapy Evaluation Patient Details Name: Tim Day MRN: [id number] DOB: [birth year] Today's Date: 08/25/2020   History of Present Illness  69 year old with hx of recent COVID pneumonia diagnosis (9/7-9/13, discharge on 2L O2), BL COVID induced DVTs, DM2, GERD HTN presenting with worsening SOB. Per chart found to have left pneumothorax and worsening bilateral infiltrates. Intubated 9/19-9/20   Clinical Impression  Pt admitted with/for worsening SOB post original d/c post Covid with PNA.  Now needing mod to max assist for basic mobility, needing significantly more oxygen to maintain adequate SpO2.Marland Kitchen  Pt currently limited functionally due to the problems listed. ( See problems list.)   Pt will benefit from PT to maximize function and safety in order to get ready for next venue listed below.     Follow Up Recommendations CIR;Supervision/Assistance - 24 hour    Equipment Recommendations  None recommended by PT    Recommendations for Other Services       Precautions / Restrictions Precautions Precautions: None;Fall Precaution Comments: watch 02      Mobility  Bed Mobility Overal bed mobility: Needs Assistance Bed Mobility: Supine to Sit;Sit to Supine;Rolling     Supine to sit: Min assist;Mod assist Sit to supine: Mod assist   General bed mobility comments:  minimally participative with UEs,  assisted pt's LE, to EOB, help pivot with padding and assisted up and forward.  Transfers       Sit to Stand: Max assist         General transfer comment: submaximal stand to scoot to EOB.  pt without alot of reserve to assist well.  Ambulation/Gait             General Gait Details: unable today  Stairs            Wheelchair Mobility    Modified Rankin (Stroke Patients Only)       Balance Overall balance assessment: Needs assistance Sitting-balance support: Feet supported;Single extremity supported;No upper extremity supported Sitting  balance-Leahy Scale: Fair Sitting balance - Comments: slumped posture and only interested in "catching his breath"  Sats were maintained in the min to upper 80's  Coached for efficient breathing to help the SpO2 rise to 88%                                     Pertinent Vitals/Pain Pain Assessment: Faces Faces Pain Scale: Hurts a little bit Pain Location: CT location Pain Descriptors / Indicators: Discomfort Pain Intervention(s): Limited activity within patient's tolerance;Monitored during session;Repositioned    Home Living Family/patient expects to be discharged to:: Private residence Living Arrangements: Spouse/significant other Available Help at Discharge: Family;Available 24 hours/day Type of Home: House Home Access: Stairs to enter Entrance Stairs-Rails: Right Entrance Stairs-Number of Steps: 6 Home Layout: One level Home Equipment: None Additional Comments: wife also had covid-  not sure of her recovery    Prior Function Level of Independence: Independent         Comments: Hydrographic surveyor, drives, works using [employer]   Dominant Hand: Right    Extremity/Trunk Assessment   Upper Extremity Assessment Upper Extremity Assessment: Generalized weakness (UE's weak, but functional)    Lower Extremity Assessment Lower Extremity Assessment: Generalized weakness    Cervical / Trunk Assessment Cervical / Trunk Assessment:  (slumped posture)  Communication   Communication: No difficulties  Cognition Arousal/Alertness: Awake/alert  Behavior During Therapy: Anxious;WFL for tasks assessed/performed Overall Cognitive Status: Within Functional Limits for tasks assessed (a little foggy)                                        General Comments General comments (skin integrity, edema, etc.): Pt on 30 L at 80% FiO2, initially SpO2 in mid 90's, HR 80's/90's eating an Icy around his NRB on 15L.  With  exercise, pt able to maintain vitals, with transition to EOB and sitting EOB, sats dropped into the mid 80's, HR increased into the 110's and RR to upper 30's.  Pt took 3-4 min at EOB to climb to 88%.    Exercises Other Exercises Other Exercises: Warm up hip/knee flex/ext ROM with graded resistance/AAROM gross flexion Other Exercises: bil bicep/tricep presses with graded resistance, shoulder flexion to the ceiling x10 reps each.   Assessment/Plan    PT Assessment Patient needs continued PT services  PT Problem List Decreased strength;Decreased activity tolerance;Cardiopulmonary status limiting activity;Decreased mobility;Decreased coordination;Decreased balance       PT Treatment Interventions Balance training;DME instruction;Gait training;Stair training;Functional mobility training;Patient/family education;Therapeutic activities;Therapeutic exercise    PT Goals (Current goals can be found in the Care Plan section)  Acute Rehab PT Goals Patient Stated Goal: get better, get home when able PT Goal Formulation: With patient Time For Goal Achievement: 09/08/20 Potential to Achieve Goals: Good    Frequency Min 3X/week   Barriers to discharge        Co-evaluation               AM-PAC PT "6 Clicks" Mobility  Outcome Measure Help needed turning from your back to your side while in a flat bed without using bedrails?: A Little Help needed moving from lying on your back to sitting on the side of a flat bed without using bedrails?: A Lot Help needed moving to and from a bed to a chair (including a wheelchair)?: Total Help needed standing up from a chair using your arms (e.g., wheelchair or bedside chair)?: Total Help needed to walk in hospital room?: Total Help needed climbing 3-5 steps with a railing? : Total 6 Click Score: 9    End of Session Equipment Utilized During Treatment: Oxygen Activity Tolerance: Patient tolerated treatment well;Patient limited by fatigue Patient  left: in bed;with SCD's reapplied Nurse Communication: Mobility status PT Visit Diagnosis: Other abnormalities of gait and mobility (R26.89);Muscle weakness (generalized) (M62.81);Difficulty in walking, not elsewhere classified (R26.2)    Time: 5883-2549 PT Time Calculation (min) (ACUTE ONLY): 33 min   Charges:   PT Evaluation $PT Eval Moderate Complexity: 1 Mod PT Treatments $Therapeutic Activity: 8-22 mins        08/25/2020  Ginger Carne., PT Acute Rehabilitation Services [phone]  (pager) [phone]  (office)  Tessie Fass Desteny Freeman 08/25/2020, 1:23 PM

## 2020-08-26 ENCOUNTER — Inpatient Hospital Stay (HOSPITAL_COMMUNITY): Payer: 59

## 2020-08-26 DIAGNOSIS — J9601 Acute respiratory failure with hypoxia: Secondary | ICD-10-CM

## 2020-08-26 LAB — GLUCOSE, CAPILLARY
Glucose-Capillary: 115 mg/dL — ABNORMAL HIGH (ref 70–99)
Glucose-Capillary: 137 mg/dL — ABNORMAL HIGH (ref 70–99)
Glucose-Capillary: 164 mg/dL — ABNORMAL HIGH (ref 70–99)
Glucose-Capillary: 236 mg/dL — ABNORMAL HIGH (ref 70–99)
Glucose-Capillary: 275 mg/dL — ABNORMAL HIGH (ref 70–99)
Glucose-Capillary: 340 mg/dL — ABNORMAL HIGH (ref 70–99)

## 2020-08-26 LAB — ECHOCARDIOGRAM COMPLETE
Area-P 1/2: 3.72 cm2
Height: 70 in
S' Lateral: 1.8 cm
Weight: 2839.52 oz

## 2020-08-26 LAB — MAGNESIUM
Magnesium: 2.1 mg/dL (ref 1.7–2.4)
Magnesium: 2 mg/dL (ref 1.7–2.4)

## 2020-08-26 LAB — COMPREHENSIVE METABOLIC PANEL
ALT: 57 U/L — ABNORMAL HIGH (ref 0–44)
AST: 30 U/L (ref 15–41)
Albumin: 2.2 g/dL — ABNORMAL LOW (ref 3.5–5.0)
Alkaline Phosphatase: 65 U/L (ref 38–126)
Anion gap: 9 (ref 5–15)
BUN: 22 mg/dL (ref 8–23)
CO2: 31 mmol/L (ref 22–32)
Calcium: 8.4 mg/dL — ABNORMAL LOW (ref 8.9–10.3)
Chloride: 94 mmol/L — ABNORMAL LOW (ref 98–111)
Creatinine, Ser: 0.76 mg/dL (ref 0.61–1.24)
GFR calc Af Amer: >60 mL/min (ref >60)
GFR calc non Af Amer: >60 mL/min (ref >60)
Glucose, Bld: 186 mg/dL — ABNORMAL HIGH (ref 70–99)
Potassium: 5 mmol/L (ref 3.5–5.1)
Sodium: 134 mmol/L — ABNORMAL LOW (ref 135–145)
Total Bilirubin: 0.9 mg/dL (ref 0.3–1.2)
Total Protein: 5.3 g/dL — ABNORMAL LOW (ref 6.5–8.1)

## 2020-08-26 LAB — PHOSPHORUS
Phosphorus: 1.8 mg/dL — ABNORMAL LOW (ref 2.5–4.6)
Phosphorus: 2.4 mg/dL — ABNORMAL LOW (ref 2.5–4.6)

## 2020-08-26 LAB — HEPARIN LEVEL (UNFRACTIONATED): Heparin Unfractionated: 0.38 IU/mL (ref 0.30–0.70)

## 2020-08-26 MED ORDER — INSULIN ASPART 100 UNIT/ML ~~LOC~~ SOLN
4.0000 [IU] | Freq: Three times a day (TID) | SUBCUTANEOUS | Status: DC
  Administered 2020-08-26 – 2020-08-29 (×12): 4 [IU] via SUBCUTANEOUS

## 2020-08-26 MED ORDER — SODIUM PHOSPHATES 45 MMOLE/15ML IV SOLN
10.0000 mmol | Freq: Once | INTRAVENOUS | Status: AC
  Administered 2020-08-26: 10 mmol via INTRAVENOUS
  Filled 2020-08-26: qty 3.33

## 2020-08-26 MED ORDER — PNEUMOCOCCAL VAC POLYVALENT 25 MCG/0.5ML IJ INJ
0.5000 mL | INJECTION | INTRAMUSCULAR | Status: AC | PRN
  Administered 2020-09-28: 0.5 mL via INTRAMUSCULAR

## 2020-08-26 MED ORDER — MELATONIN 3 MG PO TABS
3.0000 mg | ORAL_TABLET | Freq: Every evening | ORAL | Status: AC | PRN
  Administered 2020-08-27 – 2020-08-28 (×3): 3 mg via ORAL
  Filled 2020-08-26 (×3): qty 1

## 2020-08-26 MED ORDER — ALBUTEROL SULFATE (2.5 MG/3ML) 0.083% IN NEBU
2.5000 mg | INHALATION_SOLUTION | Freq: Once | RESPIRATORY_TRACT | Status: AC | PRN
  Administered 2020-08-26: 2.5 mg via RESPIRATORY_TRACT

## 2020-08-26 MED ORDER — ALBUTEROL SULFATE (2.5 MG/3ML) 0.083% IN NEBU
INHALATION_SOLUTION | RESPIRATORY_TRACT | Status: AC
  Filled 2020-08-26: qty 3

## 2020-08-26 NOTE — Progress Notes (Addendum)
Patient is currently on NRB and HHFNC at [place] at 40% FIO2 on HHFNC with O2 sat of 95%.  Will try Bipap later tonight.

## 2020-08-26 NOTE — Progress Notes (Signed)
ANTICOAGULATION CONSULT NOTE - Follow Up Consult  Pharmacy Consult for Heparin Indication: VTE treatment  Allergies  Allergen Reactions  . Eggs Or Egg-Derived Products Hives    Patient Measurements: Height: 5\' 10"  (177.8 cm) Weight: 80.5 kg (177 lb 7.5 oz) IBW/kg (Calculated) : 73 kg  Vital Signs: Temp: 97.9 F (36.6 C) (09/25 0415) Temp Source: Oral (09/25 0011) BP: 139/73 (09/25 0415) Pulse Rate: 83 (09/25 0415)  Labs: Recent Labs    08/23/20 1704 08/23/20 1719 08/24/20 0423 08/24/20 0424 08/25/20 0041 08/25/20 0533  HGB  --   --  12.5*  --  12.2*  --   HCT  --   --  38.0*  --  37.2*  --   PLT  --   --  174  --  167  --   APTT  --  67* 77*  --   --  91*  HEPARINUNFRC 0.60  --   --  0.61  --  0.66  CREATININE  --   --  0.83  --   --  0.65  TROPONINIHS  --   --   --   --  184* 191*    Estimated Creatinine Clearance: 92.5 mL/min (by C-G formula based on SCr of 0.65 mg/dL).   Medications:   Scheduled:  . chlorhexidine  15 mL Mouth Rinse BID  . Chlorhexidine Gluconate Cloth  6 each Topical Daily  . docusate  100 mg Oral BID  . feeding supplement (PROSource TF)  45 mL Per Tube BID  . insulin aspart  0-20 Units Subcutaneous Q4H  . linagliptin  5 mg Oral Daily  . mouth rinse  15 mL Mouth Rinse q12n4p  . pantoprazole sodium  40 mg Per Tube QHS  . polyethylene glycol  17 g Oral BID  . predniSONE  30 mg Oral Q breakfast   Followed by  . [START ON 08/28/2020] predniSONE  20 mg Oral Q breakfast   Followed by  . [START ON 08/30/2020] predniSONE  10 mg Oral Q breakfast   Followed by  . [START ON 08/31/2020] predniSONE  5 mg Oral Q breakfast   Infusions:  . sodium chloride    . feeding supplement (OSMOLITE 1.5 CAL) 30 mL/hr at 08/25/20 2320  . heparin 1,300 Units/hr (08/25/20 2101)   PRN: albuterol, docusate, melatonin, ondansetron **OR** ondansetron (ZOFRAN) IV, polyethylene glycol, sodium chloride, Thrombi-Pad   Anti-infectives (From admission, onward)   Start      Dose/Rate Route Frequency Ordered Stop   08/19/20 2000  piperacillin-tazobactam (ZOSYN) IVPB 3.375 g        3.375 g 12.5 mL/hr over 240 Minutes Intravenous Every 8 hours 08/19/20 1850 08/23/20 2359   08/19/20 1845  piperacillin-tazobactam (ZOSYN) IVPB 3.375 g  Status:  Discontinued        3.375 g 100 mL/hr over 30 Minutes Intravenous  Once 08/19/20 1838 08/19/20 1850   08/19/20 0900  vancomycin (VANCOREADY) IVPB 1500 mg/300 mL        1,500 mg 150 mL/hr over 120 Minutes Intravenous  Once 08/19/20 0831 08/19/20 1303   08/19/20 0845  piperacillin-tazobactam (ZOSYN) IVPB 3.375 g        3.375 g 100 mL/hr over 30 Minutes Intravenous  Once 08/19/20 0831 08/19/20 1103      Assessment: 69 yo male with a history of T2DM, HTN, HLD, GERD, and recent hospitalization 9/1-9/3 for Covid-19 pneumonia (discharged on 6LPM O2; completed remdesivir infusions outpatient on 9/5). Patient admitted again from 9/7-9/13 and found to  have acute bilateral DVTs with concern for PE but no definitive PE was noted on imaging. Pt was initiated on apixaban and continued treatment as an outpatient for DVT. The patient presented again on 9/18 with increasing SOB. Patients last dose of apixaban was 9/17 at 1900. Pharmacy is now consulted to dose heparin.  The patients heparin level this morning is therapeutic at 0.38 while running at 1300 units/hr. Last CBC is WNL and stable. There are no issues with the infusion and no signs or symptoms of bleeding noted by the RN.   Goal of Therapy:  Heparin level 0.3-0.7 units/ml Monitor platelets by anticoagulation protocol: Yes   Plan:  Continue heparin IV at 1300 units/hr Monitor daily heparin level and CBC Monitor for signs and symptoms of bleeding Follow up future anticoagulation plans  Shauna Hugh, PharmD, [place]  PGY-1 Pharmacy Resident 08/26/2020 7:05 AM  Please check [email or web address] for unit-specific pharmacy phone numbers.

## 2020-08-26 NOTE — Progress Notes (Signed)
eLink Physician-Brief Progress Note Patient Name: Muscab Brenneman DOB: [birth year] MRN: [id number]   Date of Service  08/26/2020  HPI/Events of Note  Patient requesting a sleep aid.  eICU Interventions  Melatonin 3 mg via Cortrak Q HS PRN x 3 days.        Kerry Kass Ulysse Siemen 08/26/2020, 1:48 AM

## 2020-08-26 NOTE — Significant Event (Signed)
Rapid Response Event Note   Reason for Call :  SOB and respiratory distress   Initial Focused Assessment:  Patient in bed, on 40L 40% HHF, and 16L NRB.  Patient stated that he was short of breath and having some trouble breathing.  Patient is diaphoretic but is not using accessory muscles for breathing.  According to RN, he has been on 16L NRB all night.    BP 116/68 RR 30 ECG 107 o2 99%   Interventions:  1x albuterol treatment given, RRT called and made aware of patient, MD came to bedside.  Plan of Care:  Patient will get a cxray.  For now he will stay on 6E and continue to be monitored   Event Summary:   MD Notified: [facility] hospitalist Call Time: was on unit rounding Arrival Time: [place]  Venetia Maxon, RN

## 2020-08-26 NOTE — Progress Notes (Signed)
RN & rapid RN called RT stating pt does not look good has increase WOB & increased RR. Rapid response gave pt PRN breathing treatment with somewhat improvement. RT increased pt's HHFNC to 50% 40L while on NRB. MD at bedside. RT will continue to monitor pt.

## 2020-08-26 NOTE — Progress Notes (Signed)
PROGRESS NOTE    Tim Day  ZOX:[id number] DOB: 04-25-1951 DOA: 08/19/2020 PCP: Celene Squibb, MD   Chief Complaint  Patient presents with  . Covid Positive  . 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 She has found to have worsening bilateral infiltrates chest tube placed at any pain ER and sent to [facility] for further management. Patient was managed in ICU, initially was intubated and since has been extubated and on Heated high flow nasal cannula facemask, has been off Covid unit and transferred to [facility] 9/25  [facility] events 9/1 treated for COVID - [place] 9/3 - discharged on 6L [place] xxx 9/4 and 9/5 - decadron and remdesivir opd xxxx 9/7- readmitted with worsening hypoxemia and pneumomediastinum 9/8 -  noted to have bilateral DVT,  9/13 - d/c home 9/13 on O2,  xxx 9/18 - returned with pneumothorax -> 50L - 100% HFNC 9/18 [facility] to [facility], chest tube exchanged prior to transfer; chest tube occluded, required maniuplation. 9/19 second chest tube placed. Intubated. CVL placed.  9/20 weaning; appears comfortable on spontaneous breathing trial.  Still having bloody output from both chest tubes, heparin infusion placed on hold for 8 hours.  Extubated. Late in evening oxygen requirements trending up. sats 75% in spite of high flow. XRAY w/ large PTX.. chest tubes not functioning. Lateral chest tube removed. Obstructed w/ old clot. New 28 french chest tube placed.  9/21 still short of breath. airleak 1/7 on lateral tube w/ tidal req high flow and NRB Weaning oxygen again 9/22 9/23 - now in 20m ICU . Trx out of covid isolation.  Not on vent. Not on pressors. Very deconditioned. On HHFNC and FAce mask.  9/25 Tx to TRH 63 on HHFNC/FM Subjective: seen this am Wife at bedside He feels more short  of breath today but able to speak in full sentence, anxious. Rapid response team at bedside.  Needing more O2 HHFNC up at 40 liter 50% up from 30L/min 30% fio2 and also on NRB- saturating 98%, tachypneic in low 30s. his tachypneic in 20s to 30  For several days now Left chest tube in place. RT at bedside.   Assessment & Plan:  Acute hypoxic respiratory failure due to COVID ARDS and left pneumothorax: Not transferred to [facility], left chest tube in place to suction, on high flow nasal cannula targeting patient's comfort. Vitals, afebrile. To be high risk for intubation to monitor per pulmonary critical care. Completed baricitinib 9/24 and on currently on a steroid taper already started. PT OT mobilize as tolerated pulse ox goal more than 88%, can use NIPPV as indicated for work of breathing. He is more short of breath and needing more o2 thsi am- check CXR now- notified PCCM and discussed w Dr Chase Caller.  Left pneumothorax with chest tube clotting with blood, with very small right pneumothorax new as of 9/22 pneumothorax resolved after placing new lateral 28 [place], ear leak intermittent on the left side status post removal of apical chest tube, chest x-ray 9/24 no pneumothorax. Continue lateral chest tube to 40 cm suction as per PCCM- oordered CXR this am, notified PCCM.  Bilateral lower extremity DVT 08/09/2021 with history of upper extremity DVT in 2009 2010: In the setting of Covid patient on IV heparin with plan to transition to Eliquis-will need follow-up to sort out outpatient for duration for  anticoagulation. Was on eliquis PTA.  Hypophosphatemia:will replete.  EKG changes on 9/24 and history of chest pain troponin 184>191: Seen by cards reassured, echo ordered.  Benign essential HTN:BP well controlled. Not on meds now. At home on amlodipine Benicar HCT  DM2: Controlled hemoglobin A1c 7.2, on lantus 25 units q am and glipizide5 mg bid, metformin 100 mg bid at home.Blood sugar marginally  controlled on linagliptin, cont sliding scale. Add novolog 3 units tid if eats > 50%. Recent Labs  Lab 08/25/20 1621 08/25/20 2005 08/26/20 0008 08/26/20 0417 08/26/20 0810  GLUCAP 275* 231* 137* 115* 236*   Lab Results  Component Value Date   HGBA1C 7.2 (H) 08/08/2020   Pressure ulcer POA as below Severe protein calorie malnutrition as below, on FLD  DVT prophylaxis: SCDs Start: 08/19/20 1842Heparin gtt Code Status:   Code Status: Full Code  Family Communication: plan of care discussed with patient at bedside.  Status is: Inpatient Remains inpatient appropriate because:IV treatments appropriate due to intensity of illness or inability to take PO and ongoing management of acute hypoxic aspiratory failure needing high flow nasal cannula Monitor closely, at risk of decompensation.  Dispo: The patient is from: Home              Anticipated d/c is to: CIR              Anticipated d/c date is: > 3 days              Patient currently is not medically stable to d/c. Nutrition: Diet Order            Diet full liquid Room service appropriate? No; Fluid consistency: Thin  Diet effective now                 Nutrition Problem: Severe Malnutrition Etiology: acute illness (COVID19) Signs/Symptoms: mild fat depletion, mild muscle depletion, moderate muscle depletion, severe muscle depletion, moderate fat depletion, percent weight loss (13.3% weight loss in less than 1 month) Percent weight loss: 13.3 % Interventions: Tube feeding Body mass index is 25.46 kg/m. Pressure Ulcer: Pressure Injury 08/19/20 Buttocks Bilateral Stage 2 -  Partial thickness loss of dermis presenting as a shallow open injury with a red, pink wound bed without slough. (Active)  08/19/20 1853  Location: Buttocks  Location Orientation: Bilateral  Staging: Stage 2 -  Partial thickness loss of dermis presenting as a shallow open injury with a red, pink wound bed without slough.  Wound Description (Comments):     Present on Admission: Yes    Consultants:see note  Procedures:see note Microbiology:see note Blood Culture    Component Value Date/Time   SDES BLOOD LEFT HAND 08/19/2020 1953   SDES BLOOD LEFT ARM 08/19/2020 1953   SPECREQUEST  08/19/2020 1953    BOTTLES DRAWN AEROBIC AND ANAEROBIC Blood Culture adequate volume   SPECREQUEST  08/19/2020 1953    BOTTLES DRAWN AEROBIC ONLY Blood Culture results may not be optimal due to an inadequate volume of blood received in culture bottles   CULT  08/19/2020 1953    NO GROWTH 5 DAYS Performed at [facility], [place] [address], [place], [place] [address]    CULT  08/19/2020 1953    NO GROWTH 5 DAYS Performed at [facility] [address], [place], [place] [address]    REPTSTATUS 08/24/2020 FINAL 08/19/2020 1953   REPTSTATUS 08/24/2020 FINAL 08/19/2020 1953    Other culture-see note  Medications: Scheduled Meds: . chlorhexidine  15  mL Mouth Rinse BID  . Chlorhexidine Gluconate Cloth  6 each Topical Daily  . docusate  100 mg Oral BID  . feeding supplement (PROSource TF)  45 mL Per Tube BID  . insulin aspart  0-20 Units Subcutaneous Q4H  . insulin aspart  4 Units Subcutaneous TID WC  . linagliptin  5 mg Oral Daily  . mouth rinse  15 mL Mouth Rinse q12n4p  . pantoprazole sodium  40 mg Per Tube QHS  . polyethylene glycol  17 g Oral BID  . predniSONE  30 mg Oral Q breakfast   Followed by  . [START ON 08/28/2020] predniSONE  20 mg Oral Q breakfast   Followed by  . [START ON 08/30/2020] predniSONE  10 mg Oral Q breakfast   Followed by  . [START ON 08/31/2020] predniSONE  5 mg Oral Q breakfast   Continuous Infusions: . sodium chloride    . feeding supplement (OSMOLITE 1.5 CAL) 40 mL/hr at 08/26/20 0723  . heparin 1,300 Units/hr (08/25/20 2101)    Antimicrobials: Anti-infectives (From admission, onward)   Start     Dose/Rate Route Frequency Ordered Stop   08/19/20 2000  piperacillin-tazobactam (ZOSYN) IVPB 3.375 g         3.375 g 12.5 mL/hr over 240 Minutes Intravenous Every 8 hours 08/19/20 1850 08/23/20 2359   08/19/20 1845  piperacillin-tazobactam (ZOSYN) IVPB 3.375 g  Status:  Discontinued        3.375 g 100 mL/hr over 30 Minutes Intravenous  Once 08/19/20 1838 08/19/20 1850   08/19/20 0900  vancomycin (VANCOREADY) IVPB 1500 mg/300 mL        1,500 mg 150 mL/hr over 120 Minutes Intravenous  Once 08/19/20 0831 08/19/20 1303   08/19/20 0845  piperacillin-tazobactam (ZOSYN) IVPB 3.375 g        3.375 g 100 mL/hr over 30 Minutes Intravenous  Once 08/19/20 0831 08/19/20 1103     Objective: Vitals: Today's Vitals   08/26/20 0415 08/26/20 0729 08/26/20 0741 08/26/20 1033  BP: 139/73  116/68   Pulse: 83 97 92 (!) 107  Resp: (!) 28 (!) 30 (!) 30 (!) 30  Temp: 97.9 F (36.6 C)  98.3 F (36.8 C)   TempSrc:   Oral   SpO2: 96% 93% 95% 99%  Weight:      Height:      PainSc:        Intake/Output Summary (Last 24 hours) at 08/26/2020 1052 Last data filed at 08/26/2020 0723 Gross per 24 hour  Intake 1537.43 ml  Output 1620 ml  Net -82.57 ml   Filed Weights   08/23/20 0409 08/24/20 0500 08/25/20 0500  Weight: 77.1 kg 78.6 kg 80.5 kg   Weight change:   Intake/Output from previous day: 09/24 0701 - 09/25 0700 In: 1250.1 [P.O.:720; I.V.:230.1; NG/GT:300] Out: 1930 [WLNLG:9211; Chest Tube:5] Intake/Output this shift: Total I/O In: 336.3 [P.O.:240; I.V.:96.3] Out: 30 [Chest Tube:30]  Examination: General exam: AAO, anxious,NAD,weak appearing. Old for his age. HEENT:Oral mucosa moist, Ear/Nose WNL grossly,dentition normal. Cortrak tube+ Respiratory system: bilaterally air entry+, tachypneic,comfortable Left chest tube in place. Cardiovascular system: S1 & S2 +, regular, No JVD. Gastrointestinal system: Abdomen soft, NT,ND, BS+. Nervous System:Alert, awake, moving HIS ANKLE, ABEL TO BEND KNEES. Extremities: No edema, distal peripheral pulses palpable.  Skin: No rashes,no icterus. MSK: Normal muscle  bulk,tone, power  Data Reviewed: I have personally reviewed following labs and imaging studies CBC: Recent Labs  Lab 08/21/20 0408 08/22/20 0553 08/23/20 0400 08/24/20 0423 08/25/20  0041  WBC 15.7* 10.0 11.4* 10.5 11.8*  NEUTROABS 14.0* 9.0* 9.9* 8.8*  --   HGB 13.0 13.0 12.8* 12.5* 12.2*  HCT 40.4 39.0 39.7 38.0* 37.2*  MCV 88.6 88.8 89.4 88.6 89.2  PLT 342 176 166 174 779   Basic Metabolic Panel: Recent Labs  Lab 08/21/20 0408 08/21/20 0408 08/22/20 0553 08/23/20 0400 08/24/20 0423 08/25/20 0533 08/25/20 1206 08/25/20 1900 08/26/20 0744  NA 139   < > 139 142 136 134*  --   --  134*  K 4.6   < > 4.3 4.0 4.1 4.8  --   --  5.0  CL 106   < > 107 104 96* 93*  --   --  94*  CO2 23   < > 25 30 32 33*  --   --  31  GLUCOSE 278*   < > 101* 117* 62* 80  --   --  186*  BUN 61*   < > 41* 45* 43* 31*  --   --  22  CREATININE 1.26*   < > 0.83 0.89 0.83 0.65  --   --  0.76  CALCIUM 7.9*   < > 7.7* 7.9* 8.0* 8.2*  --   --  8.4*  MG 3.1*  --   --   --   --  2.4 2.1 2.1 2.1  PHOS 4.2  --   --   --   --  2.7 1.9* 2.5 1.8*   < > = values in this interval not displayed.   GFR: Estimated Creatinine Clearance: 92.5 mL/min (by C-G formula based on SCr of 0.76 mg/dL). Liver Function Tests: Recent Labs  Lab 08/22/20 0553 08/23/20 0400 08/24/20 0423 08/25/20 0533 08/26/20 0744  AST 45* 47* 28 25 30   ALT 91* 109* 87* 68* 57*  ALKPHOS 79 75 67 58 65  BILITOT 0.7 1.0 0.6 0.7 0.9  PROT 5.2* 5.6* 5.2* 5.0* 5.3*  ALBUMIN 1.9* 2.0* 1.9* 2.0* 2.2*   No results for input(s): LIPASE, AMYLASE in the last 168 hours. No results for input(s): AMMONIA in the last 168 hours. Coagulation Profile: No results for input(s): INR, PROTIME in the last 168 hours. Cardiac Enzymes: No results for input(s): CKTOTAL, CKMB, CKMBINDEX, TROPONINI in the last 168 hours. BNP (last 3 results) No results for input(s): PROBNP in the last 8760 hours. HbA1C: No results for input(s): HGBA1C in the last 72  hours. CBG: Recent Labs  Lab 08/25/20 1621 08/25/20 2005 08/26/20 0008 08/26/20 0417 08/26/20 0810  GLUCAP 275* 231* 137* 115* 236*   Lipid Profile: No results for input(s): CHOL, HDL, LDLCALC, TRIG, CHOLHDL, LDLDIRECT in the last 72 hours. Thyroid Function Tests: No results for input(s): TSH, T4TOTAL, FREET4, T3FREE, THYROIDAB in the last 72 hours. Anemia Panel: Recent Labs    08/24/20 0423  FERRITIN 63   Sepsis Labs: Recent Labs  Lab 08/19/20 1213 08/19/20 1954 08/20/20 0500 08/21/20 0408 08/25/20 0533  PROCALCITON  --  0.22 0.36 0.33  --   LATICACIDVEN 2.2*  --   --   --  0.7    Recent Results (from the past 240 hour(s))  SARS Coronavirus 2 by RT PCR (hospital order, performed in [facility] hospital lab) Nasopharyngeal Nasopharyngeal Swab     Status: Abnormal   Collection Time: 08/19/20  7:43 AM   Specimen: Nasopharyngeal Swab  Result Value Ref Range Status   SARS Coronavirus 2 POSITIVE (A) NEGATIVE Final    Comment: RESULT CALLED TO, READ  BACK BY AND VERIFIED WITH: TALBOT T @ 1017 ON [id number] BY HENDERSON L. (NOTE) SARS-CoV-2 target nucleic acids are DETECTED  SARS-CoV-2 RNA is generally detectable in upper respiratory specimens  during the acute phase of infection.  Positive results are indicative  of the presence of the identified virus, but do not rule out bacterial infection or co-infection with other pathogens not detected by the test.  Clinical correlation with patient history and  other diagnostic information is necessary to determine patient infection status.  The expected result is negative.  Fact Sheet for Patients:   StrictlyIdeas.no   Fact Sheet for Healthcare Providers:   BankingDealers.co.za    This test is not yet approved or cleared by the [place] FDA and  has been authorized for detection and/or diagnosis of SARS-CoV-2 by FDA under an Emergency Use Authorization (EUA).  This EUA  will remain in effect (meaning  this test can be used) for the duration of  the COVID-19 declaration under Section 564(b)(1) of the Act, 21 U.S.C. section 360-bbb-3(b)(1), unless the authorization is terminated or revoked sooner.  Performed at [facility], [address], [place], [place] [address]   Blood culture (routine x 2)     Status: None   Collection Time: 08/19/20 10:10 AM   Specimen: BLOOD RIGHT WRIST  Result Value Ref Range Status   Specimen Description   Final    BLOOD RIGHT WRIST BOTTLES DRAWN AEROBIC AND ANAEROBIC   Special Requests Blood Culture adequate volume  Final   Culture   Final    NO GROWTH 5 DAYS Performed at [facility], [address] [place], [place] [address]    Report Status 08/24/2020 FINAL  Final  Blood culture (routine x 2)     Status: None   Collection Time: 08/19/20 10:13 AM   Specimen: BLOOD LEFT WRIST  Result Value Ref Range Status   Specimen Description   Final    BLOOD LEFT WRIST BOTTLES DRAWN AEROBIC AND ANAEROBIC   Special Requests Blood Culture adequate volume  Final   Culture   Final    NO GROWTH 5 DAYS Performed at [facility], [address], [place], [place] [address]    Report Status 08/24/2020 FINAL  Final  Culture, blood (routine x 2)     Status: None   Collection Time: 08/19/20  7:53 PM   Specimen: BLOOD LEFT HAND  Result Value Ref Range Status   Specimen Description BLOOD LEFT HAND  Final   Special Requests   Final    BOTTLES DRAWN AEROBIC AND ANAEROBIC Blood Culture adequate volume   Culture   Final    NO GROWTH 5 DAYS Performed at [facility], [place] [address], [place], [place] [address]    Report Status 08/24/2020 FINAL  Final  Culture, blood (routine x 2)     Status: None   Collection Time: 08/19/20  7:53 PM   Specimen: BLOOD LEFT ARM  Result Value Ref Range Status   Specimen Description BLOOD LEFT ARM  Final   Special Requests   Final    BOTTLES DRAWN AEROBIC ONLY Blood Culture results may not be optimal due  to an inadequate volume of blood received in culture bottles   Culture   Final    NO GROWTH 5 DAYS Performed at [facility], [place] [address] [place], [place] [address]    Report Status 08/24/2020 FINAL  Final     Radiology Studies: DG CHEST PORT 1 VIEW  Result  Date: 08/25/2020 CLINICAL DATA:  COVID-19 pneumonia EXAM: PORTABLE CHEST 1 VIEW COMPARISON:  08/24/2020 FINDINGS: Lung volumes are extremely small, but are symmetric, and are stable since prior examination. Left subclavian central venous catheter with its tip at the superior cavoatrial junction and left large bore chest tube are unchanged. Patchy bibasilar pulmonary infiltrates are stable. No pneumothorax or pleural effusion. Cardiac size within normal limits. Pulmonary vascularity is normal. IMPRESSION: Stable examination with pulmonary hypoinflation and bibasilar pulmonary infiltrates. No pneumothorax. Electronically Signed   By: Fidela Salisbury MD   On: 08/25/2020 06:51     LOS: 7 days   Antonieta Pert, MD [facility]  08/26/2020, 10:52 AM

## 2020-08-26 NOTE — Progress Notes (Signed)
Echocardiogram 2D Echocardiogram has been performed.  Oneal Deputy Marlane Hirschmann 08/26/2020, 2:31 PM

## 2020-08-26 NOTE — Progress Notes (Signed)
Patient reported feeling increasing SOB, RR noted to be 30-40 breaths per min.  Patient initially on NRBM and 30% HFNC at [place].  Rapid response RN here and patient given albuterol treatment.  RT here and increased oxygen to 50% HFNC and NRBM.  Dr. Maren Beach here to see patient.

## 2020-08-26 NOTE — Progress Notes (Signed)
NAME:  Tim Day, MRN:  [id number], DOB:  29-Oct-1951, LOS: 7 ADMISSION DATE:  08/19/2020, CONSULTATION DATE:  9/19 REFERRING MD:  Roderic Palau, CHIEF COMPLAINT:  Dyspnea   Brief History   69 year old with hx of recent COVID diagnosis presenting with worsening hypoxemic resp failure and left pneumothorax.  Past Medical History  GERD HTN DM2 A1c 7.2% Has hx of DVT  09/01/2008 - Occlusive thrombus isolated to a segment of a duplicated brachial  vein in the upper arm.  12/7/20009 - Persistent thrombus in the left brachial vein.  - 12/14/2008 - Small residual thrombus in a duplicate left brachial vein.  - 12/28/2008 - thrombus in the duplicated left brachial  vein -08/09/20 - bilateral LE DVt - Started on eliquis   has a past medical history of Acute respiratory disease, Atypical mole (12/30/2012), Candidiasis of urogenital sites, Diabetes mellitus, Diffuse myofascial pain syndrome, Esophageal reflux, Hypertension, Impacted cerumen of right ear, Melanoma ([place]) (06/14/2011), Mixed hyperlipidemia, MM (malignant melanoma of skin) ([place]) (07/01/2017), Primary insomnia, Seborrheic dermatitis, unspecified, Squamous cell carcinoma of skin (06/14/2011), and Thrombocytopenia, unspecified ([place]).   has a past surgical history that includes Shoulder surgery (2008); Ankle fracture surgery; Abdominal exploration surgery; Colonoscopy (07/13/2012); and left heart catheterization with coronary angiogram (N/A, 01/20/2015).   Significant Hospital Events   9/1 treated for COVID - [place] 9/3 - discharged on 6L [place] xxx 9/4 and 9/5 - decadron and remdesivir opd xxxx  9/7- readmitted with worsening hypoxemia and pneumomediastinum 9/8 -  noted to have bilateral DVT,  9/13 - d/c home 9/13 on O2,  xxx 9/18 - returned with pneumothorax -> 50L - 100% HFNC  9/18 [facility] to [facility], chest tube exchanged prior to transfer; chest tube occluded, required maniuplation.  9/19 second chest tube placed. Intubated. CVL  placed.   9/20 weaning; appears comfortable on spontaneous breathing trial.  Still having bloody output from both chest tubes, heparin infusion placed on hold for 8 hours.  Extubated. Late in evening oxygen requirements trending up. sats 75% in spite of high flow. XRAY w/ large PTX.. chest tubes not functioning. Lateral chest tube removed. Obstructed w/ old clot. New 28 french chest tube placed.   9/21 still short of breath. airleak 1/7 on lateral tube w/ tidal req high flow and NRB Weaning oxygen again 9/22  9/23 - now in 28m ICU . Trx out of covid isolation.  Not on vent. Not on pressors. Very deconditioned. On HHFNC and FAce mask.   Consults:  n/a  Procedures:  9/18 Left chest tube 30 Fr 9/21 Left lateral 28 fr chest tube 9/21>>> 9/19: Left anterior chest tube 9/19: Left subclavian triple-lumen catheter 9/19 endotracheal tube > 9/20 Significant Diagnostic Tests:  None 26 2020 one 2D pending>>  Micro Data:  8/25 SARS cov 2 > positive 9/1 SARS cov 2 > positive 9/18 SARS cov 2> positive  9/18 Blood cx x 2>>  Antimicrobials:/Anti covid  9/1 remdesivir > completed course  9/7 baricitinib > 9/13, 9/18 >9/24 (finished 9/24)  9?18 Solumedrol > 9/20, Prednisone 50mg  9/21 >> Zosyn 9/18>>>9/22  Interim history/subjective:  08/27/2020 stepdown unit without acute distress.  Noted to have 1 episode of hypoxia earlier today with movement.  Chest x-ray has been reviewed.  Pleur-evac is been changed.   Objective   Blood pressure 137/81, pulse (!) 103, temperature (!) 97.5 F (36.4 C), temperature source Oral, resp. rate (!) 28, height 5\' 10"  (1.778 m), weight 80.5 kg, SpO2 100 %.  FiO2 (%):  [30 %-70 %] 50 %   Intake/Output Summary (Last 24 hours) at 08/26/2020 1355 Last data filed at 08/26/2020 1300 Gross per 24 hour  Intake 1498.58 ml  Output 1855 ml  Net -356.42 ml   Filed Weights   08/23/20 0409 08/24/20 0500 08/25/20 0500  Weight: 77.1 kg 78.6 kg 80.5 kg     General:  Frail appearing male who is in no acute distress at rest HEENT: Core track is in place.  No JVD or lymphadenopathy is appreciated Neuro: Grossly intact without focal defect CV: Heart sounds are regular  PULM: Decreased air movement throughout.  Currently on 50% high flow nasal cannula with nonrebreather mask off with sats 98% Extremities: warm/dry,  edema  Skin: no rashes or lesions      Resolved Hospital Problem list   AKI resolved as of 9/21 Poissible HCAP - ending zosyn 9/22 Constipation, resolved as of 9/21  Assessment & Plan:  Acute hypoxemic respiratory failure due to COVID ARDS  -Complicated acutely by left pneumothorax   Plan   Continue  pharmaceutical interventions O2 to keep saturations greater than 90% Episode of desaturation 08/27/2020 following movement Pulmonary critical care will continue to follow along. Repeat chest x-ray noted  Continue baricitinib through 9/24 Steroid taper initiated  - to taper and stop 1 week from 08/25/20 (d/w Pharmacy) Alternate high flow oxygen and NIPPV as indicated for work of breathing Incentive spirometry Mobilization Ideal pulse ox goal > 88%  Left pneumothorax, chest tube clotting with blood, has very small right pneumothorax which is new as of 9/22 -chest tubes obstructed again on 9/21. New lateral 28 fr inserted. PTX resolved.  -Airleak: Intermittent on the left side.. s/p removal of apical chest tube - CXR 9/24 - no ptx   Plan Serial chest x-rays 08/27/2020 no overt pneumothorax Chest tube set up noted to be improperly done and changed   BL LE DVT 08/09/20 (has hx of UE DVT 2009- 2011)  - diagnosed in setting of covid  Plan Continue IV heparin Transition to oral agent in the future per primary  Concern for EKG changes 9/24  And hx of chest pain. Trop 184- 191 (9 earluer n sept)  - reassured by cards 08/25/20  Plan 08/27/2020 echo is pending   DM2 with hyperglycemia-glycemic control better/ actually now lower.    Plan Sliding-scale insulin protocol per primary     Best practice:  Diet: start tube feeding-->stopped 9/21. OK for liquid diet with crushed meds only 08/25/20 Pain/Anxiety/Delirium protocol (if indicated): stopped 9/21 VAP protocol (if indicated): stopped 9/21 DVT prophylaxis: heparin infusion -> eliquis when stable to take po meds without crushing GI prophylaxis:  ppi Glucose control: SSI Mobility: bed rest Code Status: full Family Communication -wife updated at bedside  Disposition: LTAC consult requested 9/23. Move to Progressive 08/25/20 and TRH MD to assumer primary  Currently in progressive care unit pulmonary critical care asked to evaluate for respiratory distress today.  08/26/2020 Richardson Landry Tad Fancher ACNP Acute Care Nurse Practitioner Maryanna Shape Pulmonary/Critical Care Please consult Amion 08/26/2020, 1:56 PM

## 2020-08-26 NOTE — Progress Notes (Signed)
  Speech Language Pathology Treatment: Dysphagia  Patient Details Name: Tim Day MRN: [id number] DOB: [birth year] Today's Date: 08/26/2020 Time: 0850-0910 SLP Time Calculation (min) (ACUTE ONLY): 20 min  Assessment / Plan / Recommendation Clinical Impression   Patient seen to address dysphagia goals with wife present in room. Wife was feeding patient PO's from full liquids tray when SLP entered the room. SpO2 was in range of 90-92 during PO intake with HFNC in place and wife replacing venti mask after giving PO's. When SLP removed venti mask to fix broken strap, patient's SpO2 decreased to 87-88% in the span of 10-15 seconds. Venti mask replaced and he returned to baseline of 90-92%. Patient consumed cup of pudding and sips of thin liquids (broth) without overt s/s of aspiration or penetration. RR remained in range of 30-35. SLP educated wife on swallow safety of monitoring his oxygen % and RR and she verbalized and demonstrated understanding. Patient is tolerating current diet and as his respiratory status improves, likely will tolerate upgrade solids trials.    HPI HPI: 69 year old with hx of recent COVID pneumonia diagnosis (9/7-9/13, discharge on 2L O2), BL COVID induced DVTs, DM2, GERD HTN presenting with worsening SOB. Per chart found to have left pneumothorax and worsening bilateral infiltrates. Intubated 9/19-9/20      SLP Plan  Continue with current plan of care       Recommendations  Diet recommendations: Thin liquid;Other(comment) (full liquids) Liquids provided via: Cup;Straw Medication Administration: Crushed with puree Supervision: Full supervision/cueing for compensatory strategies;Staff to assist with self feeding;Trained caregiver to feed patient (wife can safely feed patient) Compensations: Slow rate;Small sips/bites;Other (Comment) (rest breaks and stop PO's if RR greater than 40 and/or SpO2 less than 90%) Postural Changes and/or Swallow Maneuvers: Seated  upright 90 degrees                Oral Care Recommendations: Oral care BID Follow up Recommendations: Other (comment) (TBD pending progress) SLP Visit Diagnosis: Dysphagia, unspecified (R13.10) Plan: Continue with current plan of care       [place], MA, [place] [facility]

## 2020-08-27 ENCOUNTER — Inpatient Hospital Stay (HOSPITAL_COMMUNITY): Payer: 59

## 2020-08-27 LAB — COMPREHENSIVE METABOLIC PANEL
ALT: 48 U/L — ABNORMAL HIGH (ref 0–44)
AST: 26 U/L (ref 15–41)
Albumin: 2.1 g/dL — ABNORMAL LOW (ref 3.5–5.0)
Alkaline Phosphatase: 62 U/L (ref 38–126)
Anion gap: 6 (ref 5–15)
BUN: 16 mg/dL (ref 8–23)
CO2: 29 mmol/L (ref 22–32)
Calcium: 8.3 mg/dL — ABNORMAL LOW (ref 8.9–10.3)
Chloride: 94 mmol/L — ABNORMAL LOW (ref 98–111)
Creatinine, Ser: 0.68 mg/dL (ref 0.61–1.24)
GFR calc Af Amer: >60 mL/min (ref >60)
GFR calc non Af Amer: >60 mL/min (ref >60)
Glucose, Bld: 131 mg/dL — ABNORMAL HIGH (ref 70–99)
Potassium: 4.9 mmol/L (ref 3.5–5.1)
Sodium: 129 mmol/L — ABNORMAL LOW (ref 135–145)
Total Bilirubin: 0.4 mg/dL (ref 0.3–1.2)
Total Protein: 5 g/dL — ABNORMAL LOW (ref 6.5–8.1)

## 2020-08-27 LAB — GLUCOSE, CAPILLARY
Glucose-Capillary: 101 mg/dL — ABNORMAL HIGH (ref 70–99)
Glucose-Capillary: 125 mg/dL — ABNORMAL HIGH (ref 70–99)
Glucose-Capillary: 172 mg/dL — ABNORMAL HIGH (ref 70–99)
Glucose-Capillary: 248 mg/dL — ABNORMAL HIGH (ref 70–99)
Glucose-Capillary: 279 mg/dL — ABNORMAL HIGH (ref 70–99)
Glucose-Capillary: 282 mg/dL — ABNORMAL HIGH (ref 70–99)
Glucose-Capillary: 285 mg/dL — ABNORMAL HIGH (ref 70–99)

## 2020-08-27 LAB — CBC
HCT: 36 % — ABNORMAL LOW (ref 39.0–52.0)
Hemoglobin: 12 g/dL — ABNORMAL LOW (ref 13.0–17.0)
MCH: 28.8 pg (ref 26.0–34.0)
MCHC: 33.3 g/dL (ref 30.0–36.0)
MCV: 86.5 fL (ref 80.0–100.0)
Platelets: 175 10*3/uL (ref 150–400)
RBC: 4.16 MIL/uL — ABNORMAL LOW (ref 4.22–5.81)
RDW: 13 % (ref 11.5–15.5)
WBC: 13.1 10*3/uL — ABNORMAL HIGH (ref 4.0–10.5)
nRBC: 0 % (ref 0.0–0.2)

## 2020-08-27 LAB — PHOSPHORUS: Phosphorus: 3.5 mg/dL (ref 2.5–4.6)

## 2020-08-27 LAB — D-DIMER, QUANTITATIVE: D-Dimer, Quant: 2.19 ug/mL-FEU — ABNORMAL HIGH (ref 0.00–0.50)

## 2020-08-27 LAB — HEPARIN LEVEL (UNFRACTIONATED): Heparin Unfractionated: 0.31 IU/mL (ref 0.30–0.70)

## 2020-08-27 MED ORDER — ENOXAPARIN SODIUM 80 MG/0.8ML ~~LOC~~ SOLN
80.0000 mg | Freq: Two times a day (BID) | SUBCUTANEOUS | Status: DC
  Administered 2020-08-27 – 2020-09-01 (×11): 80 mg via SUBCUTANEOUS
  Filled 2020-08-27 (×12): qty 0.8

## 2020-08-27 NOTE — Progress Notes (Signed)
PROGRESS NOTE    Tim Day  FXJ:[id number] DOB: 04-20-1951 DOA: 08/19/2020 PCP: Celene Squibb, MD   Chief Complaint  Patient presents with  . Covid Positive  . 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 She has found to have worsening bilateral infiltrates chest tube placed at any pain ER and sent to [facility] for further management. Patient was managed in ICU, initially was intubated and since has been extubated and on Heated high flow nasal cannula facemask, has been off Covid unit and transferred to [facility] 9/25  [facility] events 9/1 treated for COVID - [place] 9/3 - discharged on 6L [place] xxx 9/4 and 9/5 - decadron and remdesivir opd xxxx 9/7- readmitted with worsening hypoxemia and pneumomediastinum 9/8 -  noted to have bilateral DVT,  9/13 - d/c home 9/13 on O2,  xxx 9/18 - returned with pneumothorax -> 50L - 100% HFNC 9/18 [facility] to [facility], chest tube exchanged prior to transfer; chest tube occluded, required maniuplation. 9/19 second chest tube placed. Intubated. CVL placed.  9/20 weaning; appears comfortable on spontaneous breathing trial.  Still having bloody output from both chest tubes, heparin infusion placed on hold for 8 hours.  Extubated. Late in evening oxygen requirements trending up. sats 75% in spite of high flow. XRAY w/ large PTX.. chest tubes not functioning. Lateral chest tube removed. Obstructed w/ old clot. New 28 french chest tube placed.  9/21 still short of breath. airleak 1/7 on lateral tube w/ tidal req high flow and NRB Weaning oxygen again 9/22 9/23 - now in 65m ICU . Trx out of covid isolation.  Not on vent. Not on pressors. Very deconditioned. On HHFNC and FAce mask.  9/25 Tx to [facility] on HHFNC/nrb-with worsening hypoxia represents call, PCCM back on rounding team  as consult Seen by critical care and planned for bedtime BiPAP and in the daytime on RRR far off and to hold tube feeds at the time of BiPAP.  Subjective:  Off BiPAP this morning for breakfast.   On HFNC 30 L/min, 40%, and NRB Afebrile overnight, blood pressure stable.  Respiratory rate tachypneic 20s to 30s. Sodium 129, WBC 13.1, Left chest tube in place. Wants to sit at edge of bed Wife at bedside. CXR report pending   Assessment & Plan:  Acute hypoxic respiratory failure due to COVID ARDS and left pneumothorax: Not transferred to [facility], left chest tube in place to suction, on high flow nasal cannula targeting patient's comfort.On 9/25 am with worsening hypoxia represents call, PCCM back on rounding team as consult Seen by critical care and planned for bedtime BiPAP and in the daytime on RRR far off and to hold tube feeds at the time of BiPAP.Completed baricitinib 9/24.  Continue steroid taper, PT OT as tolerated chest x-ray and and intermittent labs. WBC at 13k, monitor.  Left pneumothorax with chest tube clotting with blood, with very small right pneumothorax new as of 9/22 pneumothorax resolved after placing new lateral 28 [place], ear leak intermittent on the left side status post removal of apical chest tube, chest x-ray 9/24 no pneumothorax.  Repeat chest x-ray 9/25 stable left-sided chest tube without definite pneumothorax with persistent diffuse patchy lung infiltrates continue to monitor daily chest x-ray, chest tube set up changed 9/25- per PCCM. Defer chest tube management to PCCM    Bilateral lower extremity DVT  08/09/2021 with history of upper extremity DVT in 2009 2010: In the setting of Covid patient and he is on IV heparin-on eliquis PTA.  If not able to take p.o. Eliquis will switch to Lovenox today.   Hypophosphatemia: Repleted and resolved.  EKG changes on 9/24 and history of chest pain troponin 184>191: Seen by cards reassured, echo ordered.  Benign essential HTN: BP is  well controlled, home amlodipine and Benicar HCTZ is on hold.    DM2: Controlled hemoglobin A1c 7.2, on lantus 25 units q am and glipizide5 mg bid, metformin 100 mg bid at home.blood sugar well controlled on linagliptin sliding scale and NovoLog 3 units 3 times daily.   if eats > 50%. Recent Labs  Lab 08/26/20 1700 08/26/20 2125 08/27/20 0046 08/27/20 0532 08/27/20 0734  GLUCAP 340* 164* 172* 125* 101*   Lab Results  Component Value Date   HGBA1C 7.2 (H) 08/08/2020   Pressure ulcer POA as below Severe protein calorie malnutrition as below, on FLD  AT RISK OF DECOMPENSATION- MONITOR CLOSELY  DVT prophylaxis: SCDs Start: 08/19/20 1842Heparin gtt Code Status:   Code Status: Full Code  Family Communication: plan of care discussed with patient and his wife at bedside.  Status is: Inpatient Remains inpatient appropriate because:IV treatments appropriate due to intensity of illness or inability to take PO and ongoing management of acute hypoxic aspiratory failure needing high flow nasal cannula Monitor closely, at risk of decompensation.  Dispo: The patient is from: Home              Anticipated d/c is to: CIR              Anticipated d/c date is: > 3 days              Patient currently is not medically stable to d/c. Nutrition: Diet Order            Diet full liquid Room service appropriate? No; Fluid consistency: Thin  Diet effective now                 Nutrition Problem: Severe Malnutrition Etiology: acute illness (COVID19) Signs/Symptoms: mild fat depletion, mild muscle depletion, moderate muscle depletion, severe muscle depletion, moderate fat depletion, percent weight loss (13.3% weight loss in less than 1 month) Percent weight loss: 13.3 % Interventions: Tube feeding Body mass index is 25.31 kg/m. Pressure Ulcer: Pressure Injury 08/19/20 Buttocks Bilateral Stage 2 -  Partial thickness loss of dermis presenting as a shallow open injury with a red, pink wound bed  without slough. (Active)  08/19/20 1853  Location: Buttocks  Location Orientation: Bilateral  Staging: Stage 2 -  Partial thickness loss of dermis presenting as a shallow open injury with a red, pink wound bed without slough.  Wound Description (Comments):   Present on Admission: Yes    Consultants:see note  Procedures:see note Microbiology:see note Blood Culture    Component Value Date/Time   SDES BLOOD LEFT HAND 08/19/2020 1953   SDES BLOOD LEFT ARM 08/19/2020 1953   SPECREQUEST  08/19/2020 1953    BOTTLES DRAWN AEROBIC AND ANAEROBIC Blood Culture adequate volume   SPECREQUEST  08/19/2020 1953    BOTTLES DRAWN AEROBIC ONLY Blood Culture results may not be optimal due to an inadequate volume of blood received in culture bottles   CULT  08/19/2020 1953    NO GROWTH 5 DAYS Performed at [facility], [place] [address] [place], [place] [address]    CULT  08/19/2020 1953  NO GROWTH 5 DAYS Performed at [facility], [place] [address], [place], [place] [address]    REPTSTATUS 08/24/2020 FINAL 08/19/2020 1953   REPTSTATUS 08/24/2020 FINAL 08/19/2020 1953    Other culture-see note  Medications: Scheduled Meds: . chlorhexidine  15 mL Mouth Rinse BID  . docusate  100 mg Oral BID  . enoxaparin (LOVENOX) injection  80 mg Subcutaneous BID  . feeding supplement (PROSource TF)  45 mL Per Tube BID  . insulin aspart  0-20 Units Subcutaneous Q4H  . insulin aspart  4 Units Subcutaneous TID WC  . linagliptin  5 mg Oral Daily  . mouth rinse  15 mL Mouth Rinse q12n4p  . pantoprazole sodium  40 mg Per Tube QHS  . polyethylene glycol  17 g Oral BID  . [START ON 08/28/2020] predniSONE  20 mg Oral Q breakfast   Followed by  . [START ON 08/30/2020] predniSONE  10 mg Oral Q breakfast   Followed by  . [START ON 08/31/2020] predniSONE  5 mg Oral Q breakfast   Continuous Infusions: . sodium chloride    . feeding supplement (OSMOLITE 1.5 CAL) 1,000 mL (08/27/20 0927)     Antimicrobials: Anti-infectives (From admission, onward)   Start     Dose/Rate Route Frequency Ordered Stop   08/19/20 2000  piperacillin-tazobactam (ZOSYN) IVPB 3.375 g        3.375 g 12.5 mL/hr over 240 Minutes Intravenous Every 8 hours 08/19/20 1850 08/23/20 2359   08/19/20 1845  piperacillin-tazobactam (ZOSYN) IVPB 3.375 g  Status:  Discontinued        3.375 g 100 mL/hr over 30 Minutes Intravenous  Once 08/19/20 1838 08/19/20 1850   08/19/20 0900  vancomycin (VANCOREADY) IVPB 1500 mg/300 mL        1,500 mg 150 mL/hr over 120 Minutes Intravenous  Once 08/19/20 0831 08/19/20 1303   08/19/20 0845  piperacillin-tazobactam (ZOSYN) IVPB 3.375 g        3.375 g 100 mL/hr over 30 Minutes Intravenous  Once 08/19/20 0831 08/19/20 1103     Objective: Vitals: Today's Vitals   08/27/20 0536 08/27/20 0733 08/27/20 0747 08/27/20 0758  BP: 131/83 132/84 132/84   Pulse: 87 94 84 95  Resp: (!) 25 (!) 22 (!) 22 (!) 25  Temp: 97.7 F (36.5 C) (!) 97.4 F (36.3 C)    TempSrc: Axillary Oral    SpO2: 97% 97% 97% 97%  Weight: 80 kg     Height:      PainSc:        Intake/Output Summary (Last 24 hours) at 08/27/2020 0948 Last data filed at 08/27/2020 0111 Gross per 24 hour  Intake 1351.31 ml  Output 1225 ml  Net 126.31 ml   Filed Weights   08/24/20 0500 08/25/20 0500 08/27/20 0536  Weight: 78.6 kg 80.5 kg 80 kg   Weight change:   Intake/Output from previous day: 09/25 0701 - 09/26 0700 In: 1687.6 [P.O.:720; I.V.:312.1; NG/GT:535.8; IV Piggyback:119.6] Out: 8527 [Urine:1525; Chest Tube:30] Intake/Output this shift: No intake/output data recorded.  Examination:  General exam: AAOx3, old for age, weak, frail,NAD, weak appearing. HEENT:Oral mucosa moist, Ear/Nose WNL grossly, dentition normal. Respiratory system: bilaterally diminished left chest tube in place-dressing intact,,no use of accessory muscle but tachypneic Cardiovascular system: S1 & S2 +, No JVD,. Gastrointestinal  system: Abdomen soft, NT,ND, BS+ Nervous System:Alert, awake, moving extremities and grossly nonfocal Extremities: No edema, distal peripheral pulses palpable.  Skin: No rashes,no icterus. MSK: Normal muscle bulk,tone, power Cortrak+  Data Reviewed: I have personally reviewed following labs and imaging studies CBC: Recent Labs  Lab 08/21/20 0408 08/21/20 0408 08/22/20 0553 08/23/20 0400 08/24/20 0423 08/25/20 0041 08/27/20 0346  WBC 15.7*   < > 10.0 11.4* 10.5 11.8* 13.1*  NEUTROABS 14.0*  --  9.0* 9.9* 8.8*  --   --   HGB 13.0   < > 13.0 12.8* 12.5* 12.2* 12.0*  HCT 40.4   < > 39.0 39.7 38.0* 37.2* 36.0*  MCV 88.6   < > 88.8 89.4 88.6 89.2 86.5  PLT 342   < > 176 166 174 167 175   < > = values in this interval not displayed.   Basic Metabolic Panel: Recent Labs  Lab 08/21/20 0408 08/23/20 0400 08/24/20 0423 08/25/20 0533 08/25/20 0533 08/25/20 1206 08/25/20 1900 08/26/20 0744 08/26/20 1445 08/27/20 0346  NA  --  142 136 134*  --   --   --  134*  --  129*  K  --  4.0 4.1 4.8  --   --   --  5.0  --  4.9  CL  --  104 96* 93*  --   --   --  94*  --  94*  CO2  --  30 32 33*  --   --   --  31  --  29  GLUCOSE  --  117* 62* 80  --   --   --  186*  --  131*  BUN  --  45* 43* 31*  --   --   --  22  --  16  CREATININE  --  0.89 0.83 0.65  --   --   --  0.76  --  0.68  CALCIUM  --  7.9* 8.0* 8.2*  --   --   --  8.4*  --  8.3*  MG   < >  --   --  2.4  --  2.1 2.1 2.1 2.0  --   PHOS   < >  --   --  2.7   < > 1.9* 2.5 1.8* 2.4* 3.5   < > = values in this interval not displayed.   GFR: Estimated Creatinine Clearance: 92.5 mL/min (by C-G formula based on SCr of 0.68 mg/dL). Liver Function Tests: Recent Labs  Lab 08/23/20 0400 08/24/20 0423 08/25/20 0533 08/26/20 0744 08/27/20 0346  AST 47* 28 25 30 26   ALT 109* 87* 68* 57* 48*  ALKPHOS 75 67 58 65 62  BILITOT 1.0 0.6 0.7 0.9 0.4  PROT 5.6* 5.2* 5.0* 5.3* 5.0*  ALBUMIN 2.0* 1.9* 2.0* 2.2* 2.1*   No results for  input(s): LIPASE, AMYLASE in the last 168 hours. No results for input(s): AMMONIA in the last 168 hours. Coagulation Profile: No results for input(s): INR, PROTIME in the last 168 hours. Cardiac Enzymes: No results for input(s): CKTOTAL, CKMB, CKMBINDEX, TROPONINI in the last 168 hours. BNP (last 3 results) No results for input(s): PROBNP in the last 8760 hours. HbA1C: No results for input(s): HGBA1C in the last 72 hours. CBG: Recent Labs  Lab 08/26/20 1700 08/26/20 2125 08/27/20 0046 08/27/20 0532 08/27/20 0734  GLUCAP 340* 164* 172* 125* 101*   Lipid Profile: No results for input(s): CHOL, HDL, LDLCALC, TRIG, CHOLHDL, LDLDIRECT in the last 72 hours. Thyroid Function Tests: No results for input(s): TSH, T4TOTAL, FREET4, T3FREE, THYROIDAB in the last 72 hours. Anemia Panel: No results for input(s): VITAMINB12, FOLATE, FERRITIN, TIBC, IRON, RETICCTPCT  in the last 72 hours. Sepsis Labs: Recent Labs  Lab 08/21/20 0408 08/25/20 0533  PROCALCITON 0.33  --   LATICACIDVEN  --  0.7    Recent Results (from the past 240 hour(s))  SARS Coronavirus 2 by RT PCR (hospital order, performed in [facility] hospital lab) Nasopharyngeal Nasopharyngeal Swab     Status: Abnormal   Collection Time: 08/19/20  7:43 AM   Specimen: Nasopharyngeal Swab  Result Value Ref Range Status   SARS Coronavirus 2 POSITIVE (A) NEGATIVE Final    Comment: RESULT CALLED TO, READ BACK BY AND VERIFIED WITH: TALBOT T @ 1017 ON [id number] BY HENDERSON L. (NOTE) SARS-CoV-2 target nucleic acids are DETECTED  SARS-CoV-2 RNA is generally detectable in upper respiratory specimens  during the acute phase of infection.  Positive results are indicative  of the presence of the identified virus, but do not rule out bacterial infection or co-infection with other pathogens not detected by the test.  Clinical correlation with patient history and  other diagnostic information is necessary to determine patient infection status.   The expected result is negative.  Fact Sheet for Patients:   StrictlyIdeas.no   Fact Sheet for Healthcare Providers:   BankingDealers.co.za    This test is not yet approved or cleared by the [place] FDA and  has been authorized for detection and/or diagnosis of SARS-CoV-2 by FDA under an Emergency Use Authorization (EUA).  This EUA will remain in effect (meaning  this test can be used) for the duration of  the COVID-19 declaration under Section 564(b)(1) of the Act, 21 U.S.C. section 360-bbb-3(b)(1), unless the authorization is terminated or revoked sooner.  Performed at [facility], [address], [place], [place] [address]   Blood culture (routine x 2)     Status: None   Collection Time: 08/19/20 10:10 AM   Specimen: BLOOD RIGHT WRIST  Result Value Ref Range Status   Specimen Description   Final    BLOOD RIGHT WRIST BOTTLES DRAWN AEROBIC AND ANAEROBIC   Special Requests Blood Culture adequate volume  Final   Culture   Final    NO GROWTH 5 DAYS Performed at [facility], [address], [place], [place] [address]    Report Status 08/24/2020 FINAL  Final  Blood culture (routine x 2)     Status: None   Collection Time: 08/19/20 10:13 AM   Specimen: BLOOD LEFT WRIST  Result Value Ref Range Status   Specimen Description   Final    BLOOD LEFT WRIST BOTTLES DRAWN AEROBIC AND ANAEROBIC   Special Requests Blood Culture adequate volume  Final   Culture   Final    NO GROWTH 5 DAYS Performed at [facility], [address], [place], [place] [address]    Report Status 08/24/2020 FINAL  Final  Culture, blood (routine x 2)     Status: None   Collection Time: 08/19/20  7:53 PM   Specimen: BLOOD LEFT HAND  Result Value Ref Range Status   Specimen Description BLOOD LEFT HAND  Final   Special Requests   Final    BOTTLES DRAWN AEROBIC AND ANAEROBIC Blood Culture adequate volume   Culture   Final    NO GROWTH 5 DAYS Performed at  [facility], [place] [address], [place], [place] [address]    Report Status 08/24/2020 FINAL  Final  Culture, blood (routine x 2)     Status: None   Collection Time: 08/19/20  7:53 PM   Specimen: BLOOD  LEFT ARM  Result Value Ref Range Status   Specimen Description BLOOD LEFT ARM  Final   Special Requests   Final    BOTTLES DRAWN AEROBIC ONLY Blood Culture results may not be optimal due to an inadequate volume of blood received in culture bottles   Culture   Final    NO GROWTH 5 DAYS Performed at [facility], [place] [address] [place], [place] [address]    Report Status 08/24/2020 FINAL  Final     Radiology Studies: DG Chest Port 1 View  Result Date: 08/26/2020 CLINICAL DATA:  Shortness of breath.  COVID pneumonia. EXAM: PORTABLE CHEST 1 VIEW COMPARISON:  Multiple previous chest films. The most recent is 08/25/2020 FINDINGS: The left-sided chest tube is stable. No definite pneumothorax. The feeding tube is coursing down the esophagus and into the stomach. The cardiac silhouette, mediastinal and hilar contours are within normal limits and stable given the AP projection and portable technique. Persistent diffuse patchy lung infiltrates without definite pleural effusion. IMPRESSION: 1. Stable left-sided chest tube without definite pneumothorax. 2. Persistent diffuse patchy lung infiltrates. Electronically Signed   By: Marijo Sanes M.D.   On: 08/26/2020 12:25   ECHOCARDIOGRAM COMPLETE  Result Date: 08/26/2020    ECHOCARDIOGRAM REPORT   Patient Name:   MYQUAN SCHAUMBURG Date of Exam: 08/26/2020 Medical Rec #:  [id number]                Height:       70.0 in Accession #:    [id number]               Weight:       177.5 lb Date of Birth:  1951/03/02                BSA:          1.984 m Patient Age:    32 years                 BP:           137/81 mmHg Patient Gender: M                        HR:           104 bpm. Exam Location:  Inpatient Procedure: 2D Echo, Color Doppler and Cardiac  Doppler Indications:    Acute Respiratory Insufficiency R06.89  History:        Patient has prior history of Echocardiogram examinations, most                 recent 11/09/2018. Risk Factors:Hypertension, Diabetes,                 Dyslipidemia and COVID+ 08/02/20.  Sonographer:    Raquel Sarna Senior RDCS Referring Phys: [phone] [facility]  Sonographer Comments: Technically difficult; patient supine, chest tube obstructing apical window. IMPRESSIONS  1. Left ventricular ejection fraction, by estimation, is 60 to 65%. The left ventricle has normal function. The left ventricle has no regional wall motion abnormalities. There is mild concentric left ventricular hypertrophy. Left ventricular diastolic parameters are consistent with Grade I diastolic dysfunction (impaired relaxation).  2. Right ventricular systolic function is normal. The right ventricular size is normal.  3. The mitral valve is normal in structure. No evidence of mitral valve regurgitation. No evidence of mitral stenosis.  4. The aortic valve is tricuspid. There is mild calcification of the aortic valve. There is mild thickening  of the aortic valve. Aortic valve regurgitation is not visualized. No aortic stenosis is present.  5. Aortic dilatation noted. There is mild dilatation at the level of the sinuses of Valsalva, measuring 41 mm. There is mild dilatation of the ascending aorta.  6. The inferior vena cava is normal in size with greater than 50% respiratory variability, suggesting right atrial pressure of 3 mmHg. FINDINGS  Left Ventricle: Left ventricular ejection fraction, by estimation, is 60 to 65%. The left ventricle has normal function. The left ventricle has no regional wall motion abnormalities. The left ventricular internal cavity size was normal in size. There is  mild concentric left ventricular hypertrophy. Left ventricular diastolic parameters are consistent with Grade I diastolic dysfunction (impaired relaxation). Normal left ventricular  filling pressure. Right Ventricle: The right ventricular size is normal. No increase in right ventricular wall thickness. Right ventricular systolic function is normal. Left Atrium: Left atrial size was normal in size. Right Atrium: Right atrial size was normal in size. Pericardium: There is no evidence of pericardial effusion. Mitral Valve: The mitral valve is normal in structure. No evidence of mitral valve regurgitation. No evidence of mitral valve stenosis. Tricuspid Valve: The tricuspid valve is normal in structure. Tricuspid valve regurgitation is not demonstrated. No evidence of tricuspid stenosis. Aortic Valve: The aortic valve is tricuspid. There is mild calcification of the aortic valve. There is mild thickening of the aortic valve. Aortic valve regurgitation is not visualized. No aortic stenosis is present. Pulmonic Valve: The pulmonic valve was normal in structure. Pulmonic valve regurgitation is not visualized. No evidence of pulmonic stenosis. Aorta: Aortic dilatation noted. There is mild dilatation at the level of the sinuses of Valsalva, measuring 41 mm. There is mild dilatation of the ascending aorta. Venous: The inferior vena cava is normal in size with greater than 50% respiratory variability, suggesting right atrial pressure of 3 mmHg. IAS/Shunts: No atrial level shunt detected by color flow Doppler.  LEFT VENTRICLE PLAX 2D LVIDd:         3.30 cm  Diastology LVIDs:         1.80 cm  LV e' medial:    16.70 cm/s LV PW:         1.10 cm  LV E/e' medial:  2.7 LV IVS:        1.30 cm  LV e' lateral:   8.55 cm/s LVOT diam:     2.40 cm  LV E/e' lateral: 5.4 LVOT Area:     4.52 cm  RIGHT VENTRICLE RV S prime:     32.80 cm/s TAPSE (M-mode): 2.9 cm LEFT ATRIUM           Index       RIGHT ATRIUM           Index LA diam:      3.10 cm 1.56 cm/m  RA Area:     11.90 cm LA Vol (A4C): 56.4 ml 28.43 ml/m RA Volume:   24.80 ml  12.50 ml/m   AORTA Ao Root diam: 4.10 cm Ao Asc diam:  3.70 cm MITRAL VALVE MV Area  (PHT): 3.72 cm    SHUNTS MV Decel Time: 204 msec    Systemic Diam: 2.40 cm MV E velocity: 45.80 cm/s MV A velocity: 70.70 cm/s MV E/A ratio:  0.65 Skeet Latch MD Electronically signed by Skeet Latch MD Signature Date/Time: 08/26/2020/3:50:24 PM    Final      LOS: 8 days   Antonieta Pert, MD [facility]  08/27/2020,  9:48 AM

## 2020-08-27 NOTE — Progress Notes (Signed)
ANTICOAGULATION CONSULT NOTE - Initial Consult  Pharmacy Consult for enoxaparin Indication: VTE treatment  Allergies  Allergen Reactions  . Eggs Or Egg-Derived Products Hives    Patient Measurements: Height: 5\' 10"  (177.8 cm) Weight: 80 kg (176 lb 5.9 oz) IBW/kg (Calculated) : 73 kg  Vital Signs: Temp: 97.4 F (36.3 C) (09/26 0733) Temp Source: Oral (09/26 0733) BP: 132/84 (09/26 0747) Pulse Rate: 95 (09/26 0758)  Labs: Recent Labs    08/25/20 0041 08/25/20 0533 08/26/20 0744 08/27/20 0346  HGB 12.2*  --   --  12.0*  HCT 37.2*  --   --  36.0*  PLT 167  --   --  175  APTT  --  91*  --   --   HEPARINUNFRC  --  0.66 0.38 0.31  CREATININE  --  0.65 0.76 0.68  TROPONINIHS 184* 191*  --   --     Estimated Creatinine Clearance: 92.5 mL/min (by C-G formula based on SCr of 0.68 mg/dL).   Medical History: Past Medical History:  Diagnosis Date  . Acute respiratory disease   . Atypical mole 12/30/2012   severe left post shoulder tx exc  . Candidiasis of urogenital sites   . Diabetes mellitus   . Diffuse myofascial pain syndrome   . Esophageal reflux   . Hypertension   . Impacted cerumen of right ear   . Melanoma ([place]) 06/14/2011   left ear mohs  . Mixed hyperlipidemia   . MM (malignant melanoma of skin) ([place]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  . Primary insomnia   . Seborrheic dermatitis, unspecified   . Squamous cell carcinoma of skin 06/14/2011   left forearm medial cx3 52fu  . Thrombocytopenia, unspecified (HCC)     Medications:  Scheduled:  . chlorhexidine  15 mL Mouth Rinse BID  . docusate  100 mg Oral BID  . enoxaparin (LOVENOX) injection  80 mg Subcutaneous BID  . feeding supplement (PROSource TF)  45 mL Per Tube BID  . insulin aspart  0-20 Units Subcutaneous Q4H  . insulin aspart  4 Units Subcutaneous TID WC  . linagliptin  5 mg Oral Daily  . mouth rinse  15 mL Mouth Rinse q12n4p  . pantoprazole sodium  40 mg Per Tube QHS  . polyethylene  glycol  17 g Oral BID  . predniSONE  30 mg Oral Q breakfast   Followed by  . [START ON 08/28/2020] predniSONE  20 mg Oral Q breakfast   Followed by  . [START ON 08/30/2020] predniSONE  10 mg Oral Q breakfast   Followed by  . [START ON 08/31/2020] predniSONE  5 mg Oral Q breakfast   Infusions:  . sodium chloride    . feeding supplement (OSMOLITE 1.5 CAL) Stopped (08/26/20 1755)   PRN: albuterol, docusate, melatonin, ondansetron **OR** ondansetron (ZOFRAN) IV, pneumococcal 23 valent vaccine, polyethylene glycol, sodium chloride, Thrombi-Pad   Anti-infectives (From admission, onward)   Start     Dose/Rate Route Frequency Ordered Stop   08/19/20 2000  piperacillin-tazobactam (ZOSYN) IVPB 3.375 g        3.375 g 12.5 mL/hr over 240 Minutes Intravenous Every 8 hours 08/19/20 1850 08/23/20 2359   08/19/20 1845  piperacillin-tazobactam (ZOSYN) IVPB 3.375 g  Status:  Discontinued        3.375 g 100 mL/hr over 30 Minutes Intravenous  Once 08/19/20 1838 08/19/20 1850   08/19/20 0900  vancomycin (VANCOREADY) IVPB 1500 mg/300 mL        1,500  mg 150 mL/hr over 120 Minutes Intravenous  Once 08/19/20 0831 08/19/20 1303   08/19/20 0845  piperacillin-tazobactam (ZOSYN) IVPB 3.375 g        3.375 g 100 mL/hr over 30 Minutes Intravenous  Once 08/19/20 0831 08/19/20 1103      Assessment: 69 yo male with a history of T2DM, HTN, HLD, GERD, and recent hospitalization 9/1-9/3 for Covid-19 pneumonia (discharged on 6LPM O2; completed remdesivir infusions outpatient on 9/5). Patient admitted again from 9/7-9/13 and found to have acute bilateral DVTs with concern for PE but no definitive PE was noted on imaging. Pt was initiated on apixaban and continued treatment as an outpatient for DVT. The patient presented again on 9/18 with increasing SOB. Patients last dose of apixaban was 9/17 at 1900.   The patient was previously on heparin, but after discussion with the provider the patient will be transitioned to  enoxaparin. I discussed with the RN that heparin will be discontinued and enoxaparin will be started, with the first dose being given 1 hour after the heparin infusion is stopped. Pharmacy is now consulted to dose enoxaparin. CBC is WNL and stable. Per the RN, the patient does not have any signs or symptoms of bleeding.  Goal of Therapy:  Anti-Xa level 0.6-1 units/ml 4hrs after LMWH dose given Monitor platelets by anticoagulation protocol: Yes   Plan:  Initiate enoxaparin 80 mg SubQ q12h Monitor daily CBC Obtain anti-Xa levels when clinically indicated Monitor for signs and symptoms of bleeding  Shauna Hugh, PharmD, [place]  PGY-1 Pharmacy Resident 08/27/2020 8:36 AM  Please check [email or web address] for unit-specific pharmacy phone numbers.

## 2020-08-27 NOTE — Progress Notes (Signed)
NAME:  Tim Day, MRN:  [id number], DOB:  [birth year], LOS: 8 ADMISSION DATE:  08/19/2020, CONSULTATION DATE:  9/19 REFERRING MD:  Roderic Palau, CHIEF COMPLAINT:  Dyspnea   Brief History   69 year old with hx of recent COVID diagnosis presenting with worsening hypoxemic resp failure and left pneumothorax.  Past Medical History  GERD HTN DM2 A1c 7.2% Has hx of DVT  09/01/2008 - Occlusive thrombus isolated to a segment of a duplicated brachial  vein in the upper arm.  12/7/20009 - Persistent thrombus in the left brachial vein.  - 12/14/2008 - Small residual thrombus in a duplicate left brachial vein.  - 12/28/2008 - thrombus in the duplicated left brachial  vein -08/09/20 - bilateral LE DVt - Started on eliquis   has a past medical history of Acute respiratory disease, Atypical mole (12/30/2012), Candidiasis of urogenital sites, Diabetes mellitus, Diffuse myofascial pain syndrome, Esophageal reflux, Hypertension, Impacted cerumen of right ear, Melanoma ([place]) (06/14/2011), Mixed hyperlipidemia, MM (malignant melanoma of skin) ([place]) (07/01/2017), Primary insomnia, Seborrheic dermatitis, unspecified, Squamous cell carcinoma of skin (06/14/2011), and Thrombocytopenia, unspecified ([place]).   has a past surgical history that includes Shoulder surgery (2008); Ankle fracture surgery; Abdominal exploration surgery; Colonoscopy (07/13/2012); and left heart catheterization with coronary angiogram (N/A, 01/20/2015).   Significant Hospital Events   9/1 treated for COVID - [place] 9/3 - discharged on 6L [place] xxx 9/4 and 9/5 - decadron and remdesivir opd xxxx  9/7- readmitted with worsening hypoxemia and pneumomediastinum 9/8 -  noted to have bilateral DVT,  9/13 - d/c home 9/13 on O2,  xxx 9/18 - returned with pneumothorax -> 50L - 100% HFNC  9/18 [facility] to [facility], chest tube exchanged prior to transfer; chest tube occluded, required maniuplation.  9/19 second chest tube placed. Intubated. CVL  placed.   9/20 weaning; appears comfortable on spontaneous breathing trial.  Still having bloody output from both chest tubes, heparin infusion placed on hold for 8 hours.  Extubated. Late in evening oxygen requirements trending up. sats 75% in spite of high flow. XRAY w/ large PTX.. chest tubes not functioning. Lateral chest tube removed. Obstructed w/ old clot. New 28 french chest tube placed.   9/21 still short of breath. airleak 1/7 on lateral tube w/ tidal req high flow and NRB Weaning oxygen again 9/22  9/23 - now in 57m ICU . Trx out of covid isolation.  Not on vent. Not on pressors. Very deconditioned. On HHFNC and FAce mask.   Consults:  n/a  Procedures:  9/18 Left chest tube 30 Fr 9/21 Left lateral 28 fr chest tube 9/21>>> 9/19: Left anterior chest tube 9/19: Left subclavian triple-lumen catheter 9/19 endotracheal tube > 9/20 Significant Diagnostic Tests:  None 26 2020 one 2D pending>>  Micro Data:  8/25 SARS cov 2 > positive 9/1 SARS cov 2 > positive 9/18 SARS cov 2> positive  9/18 Blood cx x 2>>  Antimicrobials:/Anti covid  9/1 remdesivir > completed course  9/7 baricitinib > 9/13, 9/18 >9/24 (finished 9/24)  9?18 Solumedrol > 9/20, Prednisone 50mg  9/21 >> Zosyn 9/18>>>9/22  Interim history/subjective:  Tolerating intermittent BiPAP.  Will change chest tube to waterseal and continue to monitor   Objective   Blood pressure 132/84, pulse 95, temperature (!) 97.4 F (36.3 C), temperature source Oral, resp. rate (!) 25, height 5\' 10"  (1.778 m), weight 80 kg, SpO2 97 %.    FiO2 (%):  [30 %-50 %] 40 %   Intake/Output Summary (Last 24 hours) at  08/27/2020 0919 Last data filed at 08/27/2020 0111 Gross per 24 hour  Intake 1351.31 ml  Output 1225 ml  Net 126.31 ml   Filed Weights   08/24/20 0500 08/25/20 0500 08/27/20 0536  Weight: 78.6 kg 80.5 kg 80 kg     General: Frail appearing male no acute distress on supplemental oxygen HEENT: MM pink/moist no JVD or  lymphadenopathy is appreciated Neuro: Some confusion but otherwise intact CV: Heart sounds are distant PULM: Shallow respirations O2 saturations of 90% on reflux 40% high flow.  Left chest tube without air leak. GI: soft, bsx4 active  Extremities: warm/dry, 1+ edema  Skin: no rashes or lesions       Resolved Hospital Problem list   AKI resolved as of 9/21 Poissible HCAP - ending zosyn 9/22 Constipation, resolved as of 9/21  Assessment & Plan:  Acute hypoxemic respiratory failure due to COVID ARDS  -Complicated acutely by left pneumothorax   Plan   Continue pulmonary critical interventions supplemental oxygen to keep sats greater than 90% Pulmonary toilet with been done over the last few days been reinstituted Change noninvasive mechanical ventilatory support and nightly as needed Encouraged to get out of bed Pulmonary critical care will follow in a.m.      Left pneumothorax, chest tube clotting with blood, has very small right pneumothorax which is new as of 9/22 -chest tubes obstructed again on 9/21. New lateral 28 fr inserted. PTX resolved.   Plan Serial chest x-rays 08/27/2020 no pneumothorax is evident congestive failure bilaterally questionable decrease variation left Consideration to continue chest tube should be entertained.    BL LE DVT 08/09/20 (has hx of UE DVT 2009- 2011)  - diagnosed in setting of covid  Plan Per primary team on IV heparin primary  Concern for EKG changes 9/24  And hx of chest pain. Trop 184- 191 (9 earluer n sept)  Per cardiology  Plan Echo noted essentially unremarkable   DM2 with hyperglycemia-glycemic control better/ actually now lower.  Plan Per primary     Best practice:  Diet: start tube feeding-->stopped 9/21. OK for liquid diet with crushed meds only 08/25/20 Pain/Anxiety/Delirium protocol (if indicated): stopped 9/21 VAP protocol (if indicated): stopped 9/21 DVT prophylaxis: heparin infusion -> eliquis when  stable to take po meds without crushing GI prophylaxis:  ppi Glucose control: SSI Mobility: bed rest Code Status: full Family Communication -w Per primary  Disposition: LTAC consult requested 9/23. Move to Progressive 08/25/20 and TRH MD to assumer primary   [place] Nurse Practitioner [place] Please consult Amion 08/27/2020, 9:19 AM

## 2020-08-27 NOTE — Progress Notes (Signed)
Pt stated he would like to go on BiPAP for one hour. RT placed pt on BiPAP. Pt tolerated well with SVS.

## 2020-08-27 NOTE — Progress Notes (Signed)
Pt stated he wanted to come off BiPAP to eat breakfast. RT placed pt on HHFNC along with NRB. Pt tolerating well with SVS.

## 2020-08-27 NOTE — Progress Notes (Signed)
ANTICOAGULATION CONSULT NOTE - Follow Up Consult  Pharmacy Consult for Heparin Indication: VTE treatment  Allergies  Allergen Reactions  . Eggs Or Egg-Derived Products Hives    Patient Measurements: Height: 5\' 10"  (177.8 cm) Weight: 80 kg (176 lb 5.9 oz) IBW/kg (Calculated) : 73 kg  Vital Signs: Temp: 97.4 F (36.3 C) (09/26 0733) Temp Source: Oral (09/26 0733) BP: 132/84 (09/26 0747) Pulse Rate: 95 (09/26 0758)  Labs: Recent Labs    08/25/20 0041 08/25/20 0533 08/26/20 0744 08/27/20 0346  HGB 12.2*  --   --  12.0*  HCT 37.2*  --   --  36.0*  PLT 167  --   --  175  APTT  --  91*  --   --   HEPARINUNFRC  --  0.66 0.38 0.31  CREATININE  --  0.65 0.76 0.68  TROPONINIHS 184* 191*  --   --     Estimated Creatinine Clearance: 92.5 mL/min (by C-G formula based on SCr of 0.68 mg/dL).   Medications:   Scheduled:  . chlorhexidine  15 mL Mouth Rinse BID  . docusate  100 mg Oral BID  . feeding supplement (PROSource TF)  45 mL Per Tube BID  . insulin aspart  0-20 Units Subcutaneous Q4H  . insulin aspart  4 Units Subcutaneous TID WC  . linagliptin  5 mg Oral Daily  . mouth rinse  15 mL Mouth Rinse q12n4p  . pantoprazole sodium  40 mg Per Tube QHS  . polyethylene glycol  17 g Oral BID  . predniSONE  30 mg Oral Q breakfast   Followed by  . [START ON 08/28/2020] predniSONE  20 mg Oral Q breakfast   Followed by  . [START ON 08/30/2020] predniSONE  10 mg Oral Q breakfast   Followed by  . [START ON 08/31/2020] predniSONE  5 mg Oral Q breakfast   Infusions:  . sodium chloride    . feeding supplement (OSMOLITE 1.5 CAL) Stopped (08/26/20 1755)  . heparin 1,300 Units/hr (08/26/20 1800)   PRN: albuterol, docusate, melatonin, ondansetron **OR** ondansetron (ZOFRAN) IV, pneumococcal 23 valent vaccine, polyethylene glycol, sodium chloride, Thrombi-Pad   Anti-infectives (From admission, onward)   Start     Dose/Rate Route Frequency Ordered Stop   08/19/20 2000   piperacillin-tazobactam (ZOSYN) IVPB 3.375 g        3.375 g 12.5 mL/hr over 240 Minutes Intravenous Every 8 hours 08/19/20 1850 08/23/20 2359   08/19/20 1845  piperacillin-tazobactam (ZOSYN) IVPB 3.375 g  Status:  Discontinued        3.375 g 100 mL/hr over 30 Minutes Intravenous  Once 08/19/20 1838 08/19/20 1850   08/19/20 0900  vancomycin (VANCOREADY) IVPB 1500 mg/300 mL        1,500 mg 150 mL/hr over 120 Minutes Intravenous  Once 08/19/20 0831 08/19/20 1303   08/19/20 0845  piperacillin-tazobactam (ZOSYN) IVPB 3.375 g        3.375 g 100 mL/hr over 30 Minutes Intravenous  Once 08/19/20 0831 08/19/20 1103      Assessment: 69 yo male with a history of T2DM, HTN, HLD, GERD, and recent hospitalization 9/1-9/3 for Covid-19 pneumonia (discharged on 6LPM O2; completed remdesivir infusions outpatient on 9/5). Patient admitted again from 9/7-9/13 and found to have acute bilateral DVTs with concern for PE but no definitive PE was noted on imaging. Pt was initiated on apixaban and continued treatment as an outpatient for DVT. The patient presented again on 9/18 with increasing SOB. Patients last dose of  apixaban was 9/17 at 1900. Pharmacy is now consulted to dose heparin.  The patients heparin level this morning is barely therapeutic at 0.31 while running at 1300 units/hr. CBC is WNL and stable. There are no issues with the infusion and no signs or symptoms of bleeding noted by the RN.   Due to the patient's indication of VTE treatment combined with the fact that the heparin level has been decreasing and was only slightly therapeutic this morning, will increase the dose to ensure proper VTE treatment.  Goal of Therapy:  Heparin level 0.3-0.7 units/ml Monitor platelets by anticoagulation protocol: Yes   Plan:  Increase heparin IV to 1350 units/hr Obtain 6-hour heparin level Monitor daily heparin level and CBC Monitor for signs and symptoms of bleeding Follow up future anticoagulation  plans  Shauna Hugh, PharmD, [place]  PGY-1 Pharmacy Resident 08/27/2020 8:14 AM  Please check [email or web address] for unit-specific pharmacy phone numbers.

## 2020-08-27 NOTE — Progress Notes (Signed)
RN made aware of change in MEWS score from yellow to red.

## 2020-08-27 NOTE — Progress Notes (Signed)
Pt was placed back on Bipap at 02:44 AM; tolerated well for remainder of shift.

## 2020-08-27 NOTE — Progress Notes (Signed)
RN called RT stating pt is requesting to be off BiPAP. RT placed pt on HHFNC. Pt tolerated well with SVS.

## 2020-08-28 ENCOUNTER — Inpatient Hospital Stay (HOSPITAL_COMMUNITY): Payer: 59

## 2020-08-28 LAB — PHOSPHORUS: Phosphorus: 3.3 mg/dL (ref 2.5–4.6)

## 2020-08-28 LAB — CBC
HCT: 38.2 % — ABNORMAL LOW (ref 39.0–52.0)
Hemoglobin: 12.7 g/dL — ABNORMAL LOW (ref 13.0–17.0)
MCH: 28.5 pg (ref 26.0–34.0)
MCHC: 33.2 g/dL (ref 30.0–36.0)
MCV: 85.8 fL (ref 80.0–100.0)
Platelets: 244 10*3/uL (ref 150–400)
RBC: 4.45 MIL/uL (ref 4.22–5.81)
RDW: 13.1 % (ref 11.5–15.5)
WBC: 14.6 10*3/uL — ABNORMAL HIGH (ref 4.0–10.5)
nRBC: 0.1 % (ref 0.0–0.2)

## 2020-08-28 LAB — COMPREHENSIVE METABOLIC PANEL
ALT: 51 U/L — ABNORMAL HIGH (ref 0–44)
AST: 27 U/L (ref 15–41)
Albumin: 2.2 g/dL — ABNORMAL LOW (ref 3.5–5.0)
Alkaline Phosphatase: 67 U/L (ref 38–126)
Anion gap: 8 (ref 5–15)
BUN: 16 mg/dL (ref 8–23)
CO2: 29 mmol/L (ref 22–32)
Calcium: 8.8 mg/dL — ABNORMAL LOW (ref 8.9–10.3)
Chloride: 94 mmol/L — ABNORMAL LOW (ref 98–111)
Creatinine, Ser: 0.68 mg/dL (ref 0.61–1.24)
GFR calc Af Amer: >60 mL/min (ref >60)
GFR calc non Af Amer: >60 mL/min (ref >60)
Glucose, Bld: 171 mg/dL — ABNORMAL HIGH (ref 70–99)
Potassium: 5.2 mmol/L — ABNORMAL HIGH (ref 3.5–5.1)
Sodium: 131 mmol/L — ABNORMAL LOW (ref 135–145)
Total Bilirubin: 0.4 mg/dL (ref 0.3–1.2)
Total Protein: 5.4 g/dL — ABNORMAL LOW (ref 6.5–8.1)

## 2020-08-28 LAB — GLUCOSE, CAPILLARY
Glucose-Capillary: 194 mg/dL — ABNORMAL HIGH (ref 70–99)
Glucose-Capillary: 281 mg/dL — ABNORMAL HIGH (ref 70–99)
Glucose-Capillary: 316 mg/dL — ABNORMAL HIGH (ref 70–99)
Glucose-Capillary: 277 mg/dL — ABNORMAL HIGH (ref 70–99)

## 2020-08-28 MED ORDER — OSMOLITE 1.5 CAL PO LIQD: 600.0000 mL | ORAL | Status: DC

## 2020-08-28 MED ORDER — OSMOLITE 1.5 CAL PO LIQD
600.0000 mL | ORAL | Status: DC
  Administered 2020-08-28: 600 mL
  Filled 2020-08-28 (×2): qty 711

## 2020-08-28 MED ORDER — ENSURE ENLIVE PO LIQD
237.0000 mL | Freq: Four times a day (QID) | ORAL | Status: DC
  Administered 2020-08-28 – 2020-09-05 (×30): 237 mL via ORAL

## 2020-08-28 MED ORDER — LORAZEPAM 2 MG/ML IJ SOLN
0.5000 mg | Freq: Four times a day (QID) | INTRAMUSCULAR | Status: AC | PRN
  Administered 2020-08-28: 0.5 mg via INTRAVENOUS
  Filled 2020-08-28 (×2): qty 1

## 2020-08-28 MED ORDER — GERHARDT'S BUTT CREAM
TOPICAL_CREAM | CUTANEOUS | Status: DC | PRN
  Administered 2020-08-28: 1 via TOPICAL
  Filled 2020-08-28 (×4): qty 1

## 2020-08-28 MED ORDER — ZOLPIDEM TARTRATE 5 MG PO TABS
5.0000 mg | ORAL_TABLET | Freq: Once | ORAL | Status: AC
  Administered 2020-08-28: 5 mg via ORAL
  Filled 2020-08-28: qty 1

## 2020-08-28 MED ORDER — ACETAMINOPHEN 325 MG PO TABS
650.0000 mg | ORAL_TABLET | Freq: Once | ORAL | Status: AC
  Administered 2020-08-29: 650 mg via ORAL
  Filled 2020-08-28: qty 2

## 2020-08-28 MED ORDER — LORAZEPAM 2 MG/ML IJ SOLN
0.5000 mg | Freq: Four times a day (QID) | INTRAMUSCULAR | Status: AC | PRN
  Administered 2020-08-28: 0.5 mg via INTRAVENOUS
  Filled 2020-08-28: qty 1

## 2020-08-28 NOTE — Progress Notes (Signed)
Patient removed from BIPAP and placed back on HHFNC at 25L/40%. Patient asked for non-rebreather mask to be placed for comfort. Vitals are stable, RT will continue to monitor.

## 2020-08-28 NOTE — Progress Notes (Signed)
  Speech Language Pathology Treatment: Dysphagia  Patient Details Name: Tim Day MRN: [id number] DOB: July 03, 1951 Today's Date: 08/28/2020 Time: 1501-1510 SLP Time Calculation (min) (ACUTE ONLY): 9 min  Assessment / Plan / Recommendation Clinical Impression  Pt was seen for dysphagia treatment. [name], RN indicated that the pt has been tolerating meals without overt s/sx of aspiration. Pt stated that he has been able to eat some foods but becomes dyspneic when he attempts to eat ice cream. He was educated regarding the impact of his current respiratory needs on his ability to adequately coordinate respiration with deglutition as well as his increased risk of aspiration when he is dyspneic. He verbalized understanding and agreed to take breaks during these times. He tolerated thin liquids without overt s/sx of aspiration and was able to remove NRB for p.o. intake without significant change in vitals or respiratory distress. Pt's RR was intermittently 38-40 but was below 30 during p.o. intake and SpO2 was >95%. Pt requested that additional boluses be deferred. SLP will continue to follow pt.    HPI HPI: 69 year old with hx of recent COVID pneumonia diagnosis (9/7-9/13, discharge on 2L O2), BL COVID induced DVTs, DM2, GERD HTN presenting with worsening SOB. Per chart found to have left pneumothorax and worsening bilateral infiltrates. Intubated 9/19-9/20      SLP Plan  Continue with current plan of care       Recommendations  Diet recommendations: Thin liquid (full liquids) Liquids provided via: Cup;Straw Medication Administration: Crushed with puree Supervision: Full supervision/cueing for compensatory strategies;Staff to assist with self feeding;Trained caregiver to feed patient Compensations: Slow rate;Small sips/bites;Other (Comment) (rest breaks if RR elevated/pt becomes dyspneic/increased WOB) Postural Changes and/or Swallow Maneuvers: Seated upright 90 degrees;Upright  30-60 min after meal                Oral Care Recommendations: Oral care BID Follow up Recommendations: Inpatient Rehab SLP Visit Diagnosis: Dysphagia, unspecified (R13.10) Plan: Continue with current plan of care       Maxten Shuler I. Hardin Negus, [place], [place] Office number [phone] Pager [place] 08/28/2020, 3:29 PM

## 2020-08-28 NOTE — Progress Notes (Signed)
eLink Physician-Brief Progress Note Patient Name: Tim Day DOB: Aug 12, 1951 MRN: [id number]   Date of Service  08/28/2020  HPI/Events of Note  Patient requests home Ambien for sleep aid. I do not see Ambien on his home medication list.   eICU Interventions  Plan: 1. Ambien 5 mg PO at HS X 1.      Intervention Category Major Interventions: Other:  Lysle Dingwall 08/28/2020, 8:25 PM

## 2020-08-28 NOTE — Care Management (Addendum)
Case Manager spoke with Raquel Sarna at [facility] this am. She will begin insurance authorization for patient. TOC Team will continue to monitor. Ricki Miller, RN BSN Case Manager.

## 2020-08-28 NOTE — Progress Notes (Signed)
Cardiology consulted for EKG abnormalities with T wave inversions.  High-sensitivity troponin flat at 184 >191.  No chest pain reported.  Echocardiogram showed normal biventricular function, no significant valvular disease.  No further cardiac work-up recommended at this time.  Cardiology will sign-off, but please call if we can be of any further assistance.  Donato Heinz, MD

## 2020-08-28 NOTE — Progress Notes (Addendum)
PROGRESS NOTE    Tim Day  PNT:[id number] DOB: 1951/04/15 DOA: 08/19/2020 PCP: Celene Squibb, MD   Chief Complaint  Patient presents with  . Covid Positive  . 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 She has found to have worsening bilateral infiltrates chest tube placed at any pain ER and sent to [facility] for further management. Patient was managed in ICU, initially was intubated and since has been extubated and on Heated high flow nasal cannula facemask, has been off Covid unit and transferred to [facility] 9/25  [facility] events 9/1 treated for COVID - [place] 9/3 - discharged on 6L [place] xxx 9/4 and 9/5 - decadron and remdesivir opd xxxx 9/7- readmitted with worsening hypoxemia and pneumomediastinum 9/8 -  noted to have bilateral DVT,  9/13 - d/c home 9/13 on O2,  xxx 9/18 - returned with pneumothorax -> 50L - 100% HFNC 9/18 [facility] to [facility], chest tube exchanged prior to transfer; chest tube occluded, required maniuplation. 9/19 second chest tube placed. Intubated. CVL placed.  9/20 weaning; appears comfortable on spontaneous breathing trial.  Still having bloody output from both chest tubes, heparin infusion placed on hold for 8 hours.  Extubated. Late in evening oxygen requirements trending up. sats 75% in spite of high flow. XRAY w/ large PTX.. chest tubes not functioning. Lateral chest tube removed. Obstructed w/ old clot. New 28 french chest tube placed.  9/21 still short of breath. airleak 1/7 on lateral tube w/ tidal req high flow and NRB Weaning oxygen again 9/22 9/23 - now in 49m ICU . Trx out of covid isolation.  Not on vent. Not on pressors. Very deconditioned. On HHFNC and FAce mask.  9/25 Tx to [facility] on HHFNC/nrb-with worsening hypoxia represents call, PCCM back on rounding team  as consult Seen by critical care and planned for bedtime BiPAP and in the daytime on RRR far off and to hold tube feeds at the time of BiPAP.  Subjective:  Used BiPAP 1.30-6.45 AM but did not tolerate well , despite Ativan and melatonin. -this am on HFNC 20 L/min 40%, and nonrebreather Wife at the bedside.  He reports he is feeling overall better. k up at 5.2 Wbc also up- no fever. Increasing his oral intake as per speech recommendation CXR 9/26 unchanged appearance of the chest with bilateral airspace opacities and no pneumothorax  Assessment & Plan:  Acute hypoxic respiratory failure due to COVID ARDS and left pneumothorax: Not transferred to [facility], left chest tube in place to suction, on high flow nasal cannula targeting patient's comfort.On 9/25 am with worsening hypoxia rapid response called and seen by PCCM- now on rounding team placed on BiPAP QHS and in the daytime prn. Completed baricitinib 9/24.  Continue steroid taper as ordered by pharmacy continue PT OT as tolerated.  Continue chest tube management.  Monitor fever, WBC count.  Continue plan as per pulmonary.  Oxygen requirement seems to be improving.  Left pneumothorax with chest tube clotting with blood, with very small right pneumothorax new as of 9/22 pneumothorax resolved after placing new lateral 28 [place], ear leak intermittent on the left side status post removal of apical chest tube, chest x-ray 9/24 no pneumothorax.  Repeat chest x-ray 9/26 no pneumothorax unchanged appearance of bilateral pulmonary infiltrates.  Chest tube being managed by PCCM-tube set up changed 9/25.  Bilateral lower extremity DVT 08/09/2021 with history of upper extremity DVT in 2009 2010: In the setting of Covid patient and he is on IV heparin-on eliquis PTA.  Currently he is on Lovenox FROM 9.26  Mild hyperkalemia -monitor.  Not on ACE ARB or potassium supplementation. Mild hyponatremia monitor Hypophosphatemia: Repleted and resolved.  EKG changes  on 9/24 and history of chest pain troponin 184>191:Seen by cards reassured,echo reviewed EF 60 to 65% no regional wall motion abnormalities.  Benign essential HTN: BP is well controlled, home amlodipine and Benicar HCTZ is on hold.    DM2:Controlled hemoglobin A1c 7.2,on lantus 25 units q am and glipizide5 mg bid,metformin at home.sugar well controlled,continue linagliptin,NovoLog 3 times and SSI. Use Premeal novolog if he eats if eats > 50%. Recent Labs  Lab 08/27/20 1528 08/27/20 1944 08/27/20 2338 08/28/20 0732 08/28/20 1051  GLUCAP 279* 248* 282* 194* 281*   Lab Results  Component Value Date   HGBA1C 7.2 (H) 08/08/2020   XVQ:[id number] speech input, diet changed to solid diet, wean off to feed augment nutrition dietitian consulted.  Hopefully can wean him off tube feed,has core track in place.  Pressure ulcer POA as below.  Severe protein calorie malnutrition as below, on FLD  Patient is at risk of decompensation.  Continue to monitor closely.  DVT prophylaxis: SCDs Start: 08/19/20 1842Heparin gtt Code Status:   Code Status: Full Code  Family Communication: plan of care discussed with patient and his wife at bedside.  Status is: Inpatient Remains inpatient appropriate because:IV treatments appropriate due to intensity of illness or inability to take PO and ongoing management of acute hypoxic aspiratory failure needing high flow nasal cannula Monitor closely, at risk of decompensation.  Dispo: The patient is from: Home              Anticipated d/c is to: CIR once oxygen requirement is < 7-8 liters.              Anticipated d/c date is: > 3 days              Patient currently is not medically stable to d/c. Nutrition: Diet Order            Diet Carb Modified Fluid consistency: Thin; Room service appropriate? Yes  Diet effective now                 Nutrition Problem: Severe Malnutrition Etiology: acute illness (COVID19) Signs/Symptoms: mild fat depletion, mild  muscle depletion, moderate muscle depletion, severe muscle depletion, moderate fat depletion, percent weight loss (13.3% weight loss in less than 1 month) Percent weight loss: 13.3 % Interventions: Tube feeding Body mass index is 25.31 kg/m. Pressure Ulcer: Pressure Injury 08/19/20 Buttocks Bilateral Stage 2 -  Partial thickness loss of dermis presenting as a shallow open injury with a red, pink wound bed without slough. (Active)  08/19/20 1853  Location: Buttocks  Location Orientation: Bilateral  Staging: Stage 2 -  Partial thickness loss of dermis presenting as a shallow open injury with a red, pink wound bed without slough.  Wound Description (Comments):   Present on Admission: Yes    Consultants:see note  Procedures:see note Microbiology:see note Blood Culture    Component Value Date/Time   SDES BLOOD LEFT HAND 08/19/2020 1953   SDES BLOOD LEFT ARM 08/19/2020 1953   SPECREQUEST  08/19/2020 1953    BOTTLES DRAWN AEROBIC AND ANAEROBIC Blood Culture adequate volume   SPECREQUEST  08/19/2020 1953    BOTTLES DRAWN AEROBIC  ONLY Blood Culture results may not be optimal due to an inadequate volume of blood received in culture bottles   CULT  08/19/2020 1953    NO GROWTH 5 DAYS Performed at [facility] [address] [place], [place] [address]    CULT  08/19/2020 1953    NO GROWTH 5 DAYS Performed at [facility] [address] [place], [place] [address]    REPTSTATUS 08/24/2020 FINAL 08/19/2020 1953   REPTSTATUS 08/24/2020 FINAL 08/19/2020 1953    Other culture-see note  Medications: Scheduled Meds: . chlorhexidine  15 mL Mouth Rinse BID  . docusate  100 mg Oral BID  . enoxaparin (LOVENOX) injection  80 mg Subcutaneous BID  . feeding supplement (ENSURE ENLIVE)  237 mL Oral QID  . feeding supplement (OSMOLITE 1.5 CAL)  600 mL Per Tube Q24H  . feeding supplement (PROSource TF)  45 mL Per Tube BID  . insulin aspart  0-20 Units Subcutaneous Q4H  . insulin  aspart  4 Units Subcutaneous TID WC  . linagliptin  5 mg Oral Daily  . mouth rinse  15 mL Mouth Rinse q12n4p  . pantoprazole sodium  40 mg Per Tube QHS  . polyethylene glycol  17 g Oral BID  . predniSONE  20 mg Oral Q breakfast   Followed by  . [START ON 08/30/2020] predniSONE  10 mg Oral Q breakfast   Followed by  . [START ON 08/31/2020] predniSONE  5 mg Oral Q breakfast   Continuous Infusions: . sodium chloride      Antimicrobials: Anti-infectives (From admission, onward)   Start     Dose/Rate Route Frequency Ordered Stop   08/19/20 2000  piperacillin-tazobactam (ZOSYN) IVPB 3.375 g        3.375 g 12.5 mL/hr over 240 Minutes Intravenous Every 8 hours 08/19/20 1850 08/23/20 2359   08/19/20 1845  piperacillin-tazobactam (ZOSYN) IVPB 3.375 g  Status:  Discontinued        3.375 g 100 mL/hr over 30 Minutes Intravenous  Once 08/19/20 1838 08/19/20 1850   08/19/20 0900  vancomycin (VANCOREADY) IVPB 1500 mg/300 mL        1,500 mg 150 mL/hr over 120 Minutes Intravenous  Once 08/19/20 0831 08/19/20 1303   08/19/20 0845  piperacillin-tazobactam (ZOSYN) IVPB 3.375 g        3.375 g 100 mL/hr over 30 Minutes Intravenous  Once 08/19/20 0831 08/19/20 1103     Objective: Vitals: Today's Vitals   08/28/20 0647 08/28/20 0737 08/28/20 0751 08/28/20 0800  BP:  126/80 126/80 105/71  Pulse: (!) 107 (!) 108 (!) 117 (!) 122  Resp: 17 18 (!) 36 (!) 38  Temp:    97.7 F (36.5 C)  TempSrc:    Oral  SpO2:  98% 96%   Weight:      Height:      PainSc:    1     Intake/Output Summary (Last 24 hours) at 08/28/2020 1146 Last data filed at 08/28/2020 0200 Gross per 24 hour  Intake 1298.33 ml  Output 1950 ml  Net -651.67 ml   Filed Weights   08/24/20 0500 08/25/20 0500 08/27/20 0536  Weight: 78.6 kg 80.5 kg 80 kg   Weight change:   Intake/Output from previous day: 09/26 0701 - 09/27 0700 In: 1538.3 [P.O.:720; NG/GT:818.3] Out: 2175 [Urine:2175] Intake/Output this shift: No intake/output  data recorded.  Examination:  General exam: AAOx3, weak, frail old for age,NAD,cortrak+ HEENT:Oral mucosa moist, Ear/Nose WNL grossly, dentition normal.  Cortrak+ Respiratory system: bilaterally diminished, air entry present,no wheezing or crackles,no use of accessory muscle.  Left chest tube in place. Cardiovascular system: S1 & S2 +, No JVD. Gastrointestinal system: Abdomen soft, NT,ND,BS+. Nervous System:Alert, awake, moving extremities and grossly non-focal. Very weak. Extremities: No edema, distal peripheral pulses palpable.  Skin: No rashes,no icterus. MSK: Normal muscle bulk,tone,power.  Data Reviewed: I have personally reviewed following labs and imaging studies CBC: Recent Labs  Lab 08/22/20 0553 08/22/20 0553 08/23/20 0400 08/24/20 0423 08/25/20 0041 08/27/20 0346 08/28/20 0230  WBC 10.0   < > 11.4* 10.5 11.8* 13.1* 14.6*  NEUTROABS 9.0*  --  9.9* 8.8*  --   --   --   HGB 13.0   < > 12.8* 12.5* 12.2* 12.0* 12.7*  HCT 39.0   < > 39.7 38.0* 37.2* 36.0* 38.2*  MCV 88.8   < > 89.4 88.6 89.2 86.5 85.8  PLT 176   < > 166 174 167 175 244   < > = values in this interval not displayed.   Basic Metabolic Panel: Recent Labs  Lab 08/24/20 0423 08/25/20 0533 08/25/20 0533 08/25/20 1206 08/25/20 1206 08/25/20 1900 08/26/20 0744 08/26/20 1445 08/27/20 0346 08/28/20 0230  NA 136 134*  --   --   --   --  134*  --  129* 131*  K 4.1 4.8  --   --   --   --  5.0  --  4.9 5.2*  CL 96* 93*  --   --   --   --  94*  --  94* 94*  CO2 32 33*  --   --   --   --  31  --  29 29  GLUCOSE 62* 80  --   --   --   --  186*  --  131* 171*  BUN 43* 31*  --   --   --   --  22  --  16 16  CREATININE 0.83 0.65  --   --   --   --  0.76  --  0.68 0.68  CALCIUM 8.0* 8.2*  --   --   --   --  8.4*  --  8.3* 8.8*  MG  --  2.4  --  2.1  --  2.1 2.1 2.0  --   --   PHOS  --  2.7   < > 1.9*   < > 2.5 1.8* 2.4* 3.5 3.3   < > = values in this interval not displayed.   GFR: Estimated Creatinine  Clearance: 92.5 mL/min (by C-G formula based on SCr of 0.68 mg/dL). Liver Function Tests: Recent Labs  Lab 08/24/20 0423 08/25/20 0533 08/26/20 0744 08/27/20 0346 08/28/20 0230  AST 28 25 30 26 27   ALT 87* 68* 57* 48* 51*  ALKPHOS 67 58 65 62 67  BILITOT 0.6 0.7 0.9 0.4 0.4  PROT 5.2* 5.0* 5.3* 5.0* 5.4*  ALBUMIN 1.9* 2.0* 2.2* 2.1* 2.2*   No results for input(s): LIPASE, AMYLASE in the last 168 hours. No results for input(s): AMMONIA in the last 168 hours. Coagulation Profile: No results for input(s): INR, PROTIME in the last 168 hours. Cardiac Enzymes: No results for input(s): CKTOTAL, CKMB, CKMBINDEX, TROPONINI in the last 168 hours. BNP (last 3 results) No results for input(s): PROBNP in the last 8760 hours. HbA1C: No results for input(s): HGBA1C in the last 72 hours. CBG: Recent Labs  Lab 08/27/20 1528 08/27/20 1944 08/27/20  2338 08/28/20 0732 08/28/20 1051  GLUCAP 279* 248* 282* 194* 281*   Lipid Profile: No results for input(s): CHOL, HDL, LDLCALC, TRIG, CHOLHDL, LDLDIRECT in the last 72 hours. Thyroid Function Tests: No results for input(s): TSH, T4TOTAL, FREET4, T3FREE, THYROIDAB in the last 72 hours. Anemia Panel: No results for input(s): VITAMINB12, FOLATE, FERRITIN, TIBC, IRON, RETICCTPCT in the last 72 hours. Sepsis Labs: Recent Labs  Lab 08/25/20 0533  LATICACIDVEN 0.7    Recent Results (from the past 240 hour(s))  SARS Coronavirus 2 by RT PCR (hospital order, performed in [facility] hospital lab) Nasopharyngeal Nasopharyngeal Swab     Status: Abnormal   Collection Time: 08/19/20  7:43 AM   Specimen: Nasopharyngeal Swab  Result Value Ref Range Status   SARS Coronavirus 2 POSITIVE (A) NEGATIVE Final    Comment: RESULT CALLED TO, READ BACK BY AND VERIFIED WITH: TALBOT T @ 1017 ON [id number] BY HENDERSON L. (NOTE) SARS-CoV-2 target nucleic acids are DETECTED  SARS-CoV-2 RNA is generally detectable in upper respiratory specimens  during the acute  phase of infection.  Positive results are indicative  of the presence of the identified virus, but do not rule out bacterial infection or co-infection with other pathogens not detected by the test.  Clinical correlation with patient history and  other diagnostic information is necessary to determine patient infection status.  The expected result is negative.  Fact Sheet for Patients:   StrictlyIdeas.no   Fact Sheet for Healthcare Providers:   BankingDealers.co.za    This test is not yet approved or cleared by the [place] FDA and  has been authorized for detection and/or diagnosis of SARS-CoV-2 by FDA under an Emergency Use Authorization (EUA).  This EUA will remain in effect (meaning  this test can be used) for the duration of  the COVID-19 declaration under Section 564(b)(1) of the Act, 21 U.S.C. section 360-bbb-3(b)(1), unless the authorization is terminated or revoked sooner.  Performed at [facility], [address], [place], [place] [address]   Blood culture (routine x 2)     Status: None   Collection Time: 08/19/20 10:10 AM   Specimen: BLOOD RIGHT WRIST  Result Value Ref Range Status   Specimen Description   Final    BLOOD RIGHT WRIST BOTTLES DRAWN AEROBIC AND ANAEROBIC   Special Requests Blood Culture adequate volume  Final   Culture   Final    NO GROWTH 5 DAYS Performed at [facility], [address] [place], [place] [address]    Report Status 08/24/2020 FINAL  Final  Blood culture (routine x 2)     Status: None   Collection Time: 08/19/20 10:13 AM   Specimen: BLOOD LEFT WRIST  Result Value Ref Range Status   Specimen Description   Final    BLOOD LEFT WRIST BOTTLES DRAWN AEROBIC AND ANAEROBIC   Special Requests Blood Culture adequate volume  Final   Culture   Final    NO GROWTH 5 DAYS Performed at [facility], [address], [place], [place] [address]    Report Status 08/24/2020 FINAL  Final  Culture, blood  (routine x 2)     Status: None   Collection Time: 08/19/20  7:53 PM   Specimen: BLOOD LEFT HAND  Result Value Ref Range Status   Specimen Description BLOOD LEFT HAND  Final   Special Requests   Final    BOTTLES DRAWN AEROBIC AND ANAEROBIC Blood Culture adequate volume   Culture   Final    NO  GROWTH 5 DAYS Performed at [facility], [place] [address] [place], [place] [address]    Report Status 08/24/2020 FINAL  Final  Culture, blood (routine x 2)     Status: None   Collection Time: 08/19/20  7:53 PM   Specimen: BLOOD LEFT ARM  Result Value Ref Range Status   Specimen Description BLOOD LEFT ARM  Final   Special Requests   Final    BOTTLES DRAWN AEROBIC ONLY Blood Culture results may not be optimal due to an inadequate volume of blood received in culture bottles   Culture   Final    NO GROWTH 5 DAYS Performed at [facility], [place] [address] [place], [place] [address]    Report Status 08/24/2020 FINAL  Final     Radiology Studies: DG Chest Port 1 View  Result Date: 08/28/2020 CLINICAL DATA:  Pneumothorax EXAM: PORTABLE CHEST 1 VIEW COMPARISON:  08/27/2020 FINDINGS: Stable left chest tube.  No pneumothorax is seen. Bilateral patchy opacities, lower lobe predominant. No pleural effusion. Heart is normal in size. Enteric tube courses into the mid stomach. IMPRESSION: Stable left chest tube. No pneumothorax is seen. Bilateral patchy opacities, lower lobe predominant. Electronically Signed   By: Julian Hy M.D.   On: 08/28/2020 10:34   DG Chest Port 1 View  Result Date: 08/27/2020 CLINICAL DATA:  69 year old male with shortness of breath, COVID pneumonia and LEFT pneumothorax. EXAM: PORTABLE CHEST 1 VIEW COMPARISON:  08/27/2020 and prior exams. FINDINGS: A small bore feeding tube with tip in the peripyloric region and a LEFT thoracostomy tube again noted. A LEFT thoracostomy tube is unchanged. Bilateral airspace opacities are unchanged. No large pleural effusion or  pneumothorax noted. There has been no significant interval change. IMPRESSION: Unchanged appearance of the chest with bilateral airspace opacities. No pneumothorax. Electronically Signed   By: Margarette Canada M.D.   On: 08/27/2020 11:50   DG Chest Port 1 View  Result Date: 08/27/2020 CLINICAL DATA:  Follow-up pneumothorax EXAM: PORTABLE CHEST 1 VIEW COMPARISON:  08/26/2020 FINDINGS: Feeding catheter and left-sided thoracostomy catheter are again seen and stable. Mild cardiac enlargement is noted but stable. The overall inspiratory effort is poor with patchy bibasilar opacity stable from the prior study. No pneumothorax is noted. IMPRESSION: Stable appearance of the chest when compare with the prior exam. Electronically Signed   By: Inez Catalina M.D.   On: 08/27/2020 09:55   DG Chest Port 1 View  Result Date: 08/26/2020 CLINICAL DATA:  Shortness of breath.  COVID pneumonia. EXAM: PORTABLE CHEST 1 VIEW COMPARISON:  Multiple previous chest films. The most recent is 08/25/2020 FINDINGS: The left-sided chest tube is stable. No definite pneumothorax. The feeding tube is coursing down the esophagus and into the stomach. The cardiac silhouette, mediastinal and hilar contours are within normal limits and stable given the AP projection and portable technique. Persistent diffuse patchy lung infiltrates without definite pleural effusion. IMPRESSION: 1. Stable left-sided chest tube without definite pneumothorax. 2. Persistent diffuse patchy lung infiltrates. Electronically Signed   By: Marijo Sanes M.D.   On: 08/26/2020 12:25   ECHOCARDIOGRAM COMPLETE  Result Date: 08/26/2020    ECHOCARDIOGRAM REPORT   Patient Name:   KORVER GRAYBEAL Date of Exam: 08/26/2020 Medical Rec #:  [id number]                Height:       70.0 in Accession #:    [id number]  Weight:       177.5 lb Date of Birth:  1951/10/25                BSA:          1.984 m Patient Age:    9 years                 BP:           137/81 mmHg  Patient Gender: M                        HR:           104 bpm. Exam Location:  Inpatient Procedure: 2D Echo, Color Doppler and Cardiac Doppler Indications:    Acute Respiratory Insufficiency R06.89  History:        Patient has prior history of Echocardiogram examinations, most                 recent 11/09/2018. Risk Factors:Hypertension, Diabetes,                 Dyslipidemia and COVID+ 08/02/20.  Sonographer:    Raquel Sarna Senior RDCS Referring Phys: [phone] [facility]  Sonographer Comments: Technically difficult; patient supine, chest tube obstructing apical window. IMPRESSIONS  1. Left ventricular ejection fraction, by estimation, is 60 to 65%. The left ventricle has normal function. The left ventricle has no regional wall motion abnormalities. There is mild concentric left ventricular hypertrophy. Left ventricular diastolic parameters are consistent with Grade I diastolic dysfunction (impaired relaxation).  2. Right ventricular systolic function is normal. The right ventricular size is normal.  3. The mitral valve is normal in structure. No evidence of mitral valve regurgitation. No evidence of mitral stenosis.  4. The aortic valve is tricuspid. There is mild calcification of the aortic valve. There is mild thickening of the aortic valve. Aortic valve regurgitation is not visualized. No aortic stenosis is present.  5. Aortic dilatation noted. There is mild dilatation at the level of the sinuses of Valsalva, measuring 41 mm. There is mild dilatation of the ascending aorta.  6. The inferior vena cava is normal in size with greater than 50% respiratory variability, suggesting right atrial pressure of 3 mmHg. FINDINGS  Left Ventricle: Left ventricular ejection fraction, by estimation, is 60 to 65%. The left ventricle has normal function. The left ventricle has no regional wall motion abnormalities. The left ventricular internal cavity size was normal in size. There is  mild concentric left ventricular hypertrophy. Left  ventricular diastolic parameters are consistent with Grade I diastolic dysfunction (impaired relaxation). Normal left ventricular filling pressure. Right Ventricle: The right ventricular size is normal. No increase in right ventricular wall thickness. Right ventricular systolic function is normal. Left Atrium: Left atrial size was normal in size. Right Atrium: Right atrial size was normal in size. Pericardium: There is no evidence of pericardial effusion. Mitral Valve: The mitral valve is normal in structure. No evidence of mitral valve regurgitation. No evidence of mitral valve stenosis. Tricuspid Valve: The tricuspid valve is normal in structure. Tricuspid valve regurgitation is not demonstrated. No evidence of tricuspid stenosis. Aortic Valve: The aortic valve is tricuspid. There is mild calcification of the aortic valve. There is mild thickening of the aortic valve. Aortic valve regurgitation is not visualized. No aortic stenosis is present. Pulmonic Valve: The pulmonic valve was normal in structure. Pulmonic valve regurgitation is not visualized. No evidence of pulmonic stenosis. Aorta: Aortic dilatation noted. There  is mild dilatation at the level of the sinuses of Valsalva, measuring 41 mm. There is mild dilatation of the ascending aorta. Venous: The inferior vena cava is normal in size with greater than 50% respiratory variability, suggesting right atrial pressure of 3 mmHg. IAS/Shunts: No atrial level shunt detected by color flow Doppler.  LEFT VENTRICLE PLAX 2D LVIDd:         3.30 cm  Diastology LVIDs:         1.80 cm  LV e' medial:    16.70 cm/s LV PW:         1.10 cm  LV E/e' medial:  2.7 LV IVS:        1.30 cm  LV e' lateral:   8.55 cm/s LVOT diam:     2.40 cm  LV E/e' lateral: 5.4 LVOT Area:     4.52 cm  RIGHT VENTRICLE RV S prime:     32.80 cm/s TAPSE (M-mode): 2.9 cm LEFT ATRIUM           Index       RIGHT ATRIUM           Index LA diam:      3.10 cm 1.56 cm/m  RA Area:     11.90 cm LA Vol (A4C):  56.4 ml 28.43 ml/m RA Volume:   24.80 ml  12.50 ml/m   AORTA Ao Root diam: 4.10 cm Ao Asc diam:  3.70 cm MITRAL VALVE MV Area (PHT): 3.72 cm    SHUNTS MV Decel Time: 204 msec    Systemic Diam: 2.40 cm MV E velocity: 45.80 cm/s MV A velocity: 70.70 cm/s MV E/A ratio:  0.65 Skeet Latch MD Electronically signed by Skeet Latch MD Signature Date/Time: 08/26/2020/3:50:24 PM    Final      LOS: 9 days   Antonieta Pert, MD [facility]  08/28/2020, 11:46 AM

## 2020-08-28 NOTE — Progress Notes (Signed)
PT Cancellation Note  Patient Details Name: Tim Day MRN: [id number] DOB: 1950/12/26   Cancelled Treatment:    Reason Eval/Treat Not Completed: Medical issues which prohibited therapy (Per nsg, anxiety and incr WOB with NRB mask. HOLD today.)   Denice Paradise 08/28/2020, 12:57 PM [name] W,PT Acute Rehabilitation Services Pager:  [phone]  Office:  [phone]

## 2020-08-28 NOTE — Progress Notes (Signed)
Pt wore Bipap from 1:30 AM until 6:45 AM but struggled with toleration. Melatonin, and 2 doses of 0.5 mg ativan given but pt was still was unable to sleep for most of the evening.

## 2020-08-28 NOTE — Progress Notes (Signed)
NAME:  Tim Day, MRN:  [id number], DOB:  1951-11-17, LOS: 9 ADMISSION DATE:  08/19/2020, CONSULTATION DATE:  9/19 REFERRING MD:  Roderic Palau, CHIEF COMPLAINT:  Dyspnea   Brief History   69 year old with hx of recent COVID diagnosis presenting with worsening hypoxemic resp failure and left pneumothorax.  Past Medical History  GERD HTN DM2 A1c 7.2% Has hx of DVT  09/01/2008 - Occlusive thrombus isolated to a segment of a duplicated brachial  vein in the upper arm.  12/7/20009 - Persistent thrombus in the left brachial vein.  - 12/14/2008 - Small residual thrombus in a duplicate left brachial vein.  - 12/28/2008 - thrombus in the duplicated left brachial  vein -08/09/20 - bilateral LE DVt - Started on eliquis   has a past medical history of Acute respiratory disease, Atypical mole (12/30/2012), Candidiasis of urogenital sites, Diabetes mellitus, Diffuse myofascial pain syndrome, Esophageal reflux, Hypertension, Impacted cerumen of right ear, Melanoma ([place]) (06/14/2011), Mixed hyperlipidemia, MM (malignant melanoma of skin) ([place]) (07/01/2017), Primary insomnia, Seborrheic dermatitis, unspecified, Squamous cell carcinoma of skin (06/14/2011), and Thrombocytopenia, unspecified ([place]).   has a past surgical history that includes Shoulder surgery (2008); Ankle fracture surgery; Abdominal exploration surgery; Colonoscopy (07/13/2012); and left heart catheterization with coronary angiogram (N/A, 01/20/2015).   Significant Hospital Events   9/1 treated for COVID - [place] 9/3 - discharged on 6L [place] xxx 9/4 and 9/5 - decadron and remdesivir opd xxxx  9/7- readmitted with worsening hypoxemia and pneumomediastinum 9/8 -  noted to have bilateral DVT,  9/13 - d/c home 9/13 on O2,  xxx 9/18 - returned with pneumothorax -> 50L - 100% HFNC  9/18 [facility], chest tube exchanged prior to transfer; chest tube occluded, required maniuplation.  9/19 second chest tube placed. Intubated. CVL  placed.   9/20 weaning; appears comfortable on spontaneous breathing trial.  Still having bloody output from both chest tubes, heparin infusion placed on hold for 8 hours.  Extubated. Late in evening oxygen requirements trending up. sats 75% in spite of high flow. XRAY w/ large PTX.. chest tubes not functioning. Lateral chest tube removed. Obstructed w/ old clot. New 28 french chest tube placed.   9/21 still short of breath. airleak 1/7 on lateral tube w/ tidal req high flow and NRB Weaning oxygen again 9/22  9/23 - now in 13m ICU . Trx out of covid isolation.  Not on vent. Not on pressors. Very deconditioned. On HHFNC and FAce mask.   Consults:  n/a  Procedures:  9/18 Left chest tube 30 Fr 9/21 Left lateral 28 fr chest tube 9/21>>> 9/19: Left anterior chest tube 9/19: Left subclavian triple-lumen catheter 9/19 endotracheal tube > 9/20 Significant Diagnostic Tests:  None 26 2020 one 2D pending>>  Micro Data:  8/25 SARS cov 2 > positive 9/1 SARS cov 2 > positive 9/18 SARS cov 2> positive  9/18 Blood cx x 2>>  Antimicrobials:/Anti covid  9/1 remdesivir > completed course  9/7 baricitinib > 9/13, 9/18 >9/24 (finished 9/24)  9?18 Solumedrol > 9/20, Prednisone 50mg  9/21 >> Zosyn 9/18>>>9/22  Interim history/subjective:  Tolerating intermittent BiPAP.   Asking for home ambien to help him rest at night. CXR this AM without worsening PTX despite chest tube to water seal since yesterday  Objective   Blood pressure 105/71, pulse (!) 122, temperature 97.7 F (36.5 C), temperature source Oral, resp. rate (!) 38, height 5\' 10"  (1.778 m), weight 80 kg, SpO2 96 %.    FiO2 (%):  [  40 %] 40 %   Intake/Output Summary (Last 24 hours) at 08/28/2020 1045 Last data filed at 08/28/2020 0200 Gross per 24 hour  Intake 1298.33 ml  Output 2175 ml  Net -876.67 ml   Filed Weights   08/24/20 0500 08/25/20 0500 08/27/20 0536  Weight: 78.6 kg 80.5 kg 80 kg     General: Frail appearing male,  chronically ill appearing and very deconditioned HEENT: MM pink/moist no JVD or lymphadenopathy is appreciated Neuro: Some confusion but otherwise intact  CV: Heart sounds are distant PULM: Shallow respirations.  Left chest tube without air leak on water seal GI: soft, bsx4 active  Extremities: warm/dry, 1+ edema  Skin: no rashes or lesions   Assessment & Plan:   Acute hypoxemic respiratory failure due to COVID ARDS  - Complicated acutely by left pneumothorax.  PTX stable for past 24 hours with chest tube on water seal. - Clamp chest tube now. - If lung remains up, can consider pulling CT in AM 9/28. - Continue bronchial hygiene. - Mobilize / out of bed as able.   BL LE DVT 08/09/20 (has hx of UE DVT 2009- 2011) - diagnosed in setting of covid - Per primary team on IV heparin primary.  Severe deconditioning / frailty. - Would recommend palliative care involvement given his deconditioning and prolonged illness.  If he deteriorates further, he would be a very poor candidate for mechanical ventilation as his chances for liberation and meaningful recovery would be quite slim.   Rest per primary team.   Montey Hora, [place] 08/28/2020, 10:54 AM

## 2020-08-28 NOTE — Progress Notes (Addendum)
Nutrition Follow-up / Consult  DOCUMENTATION CODES:   Severe malnutrition in context of acute illness/injury  INTERVENTION:    Ensure Enlive po QID, each supplement provides 350 kcal and 20 grams of protein   Magic cup TID with meals, each supplement provides 290 kcal and 9 grams of protein   Diet advancement as able per MD   Change to nocturnal TF regimen: Osmolite 1.5 at 50 ml/h x 12 hours, continue Prosource TF 45 ml BID to provide 980 kcal, 60 gm protein  NUTRITION DIAGNOSIS:   Severe Malnutrition related to acute illness (COVID19) as evidenced by mild fat depletion, mild muscle depletion, moderate muscle depletion, severe muscle depletion, moderate fat depletion, percent weight loss (13.3% weight loss in less than 1 month).  Ongoing   GOAL:   Patient will meet greater than or equal to 90% of their needs  Progressing   MONITOR:   TF tolerance, PO intake, Labs, Skin  REASON FOR ASSESSMENT:   Consult, Ventilator Enteral/tube feeding initiation and management  ASSESSMENT:   69 yo male admitted with recent COVID-19 diagnosis with worsening respiratory failure and left pneumothorax.  PMH includes DM, HTN, GERD.  Chest tube is now to water seal.  Patient is very weak and deconditioned. Currently on a full liquid diet, consuming 100% of meals. He is hungry and asking for Cheerios. Patient ate 4 cups of pudding this morning with his breakfast. He consumed everything on his breakfast tray, except the grits which are still on his bedside table.   Cortrak is in place, but TF currently on hold due to BiPAP use for a few hours overnight.  TF order: Osmolite 1.5 at 50 ml/h with Prosource TF 45 ml BID.  Per flow sheet documentation, over the past 24 hours, patient has received 643 ml Osmolite 1.5 and Prosource TF 45 ml BID. This provides 1045 kcal, 62 gm protein.  Patient is currently on HFNC + NRB. He is also requiring BiPAP at times (3 hours in the past 24 hours). TF is  being held when BiPAP is on.  Labs reviewed. Na 131, K 5.2 CBG: [phone]  Medications reviewed and include colace, novolog, tradjenta, miralax, prednisone.  Weight 80 kg today, stable for a few days.  NUTRITION - FOCUSED PHYSICAL EXAM:    Most Recent Value  Orbital Region Mild depletion  Upper Arm Region Severe depletion  Thoracic and Lumbar Region Moderate depletion  Buccal Region Mild depletion  Temple Region Mild depletion  Clavicle Bone Region Severe depletion  Clavicle and Acromion Bone Region Severe depletion  Scapular Bone Region Moderate depletion  Dorsal Hand Moderate depletion  Patellar Region Severe depletion  Anterior Thigh Region Severe depletion  Posterior Calf Region Severe depletion  Edema (RD Assessment) None  Hair Reviewed  Eyes Reviewed  Mouth Reviewed  Skin Reviewed  Nails Reviewed       Diet Order:   Diet Order            Diet full liquid Room service appropriate? No; Fluid consistency: Thin  Diet effective now                 EDUCATION NEEDS:   Not appropriate for education at this time  Skin:  Skin Assessment: Skin Integrity Issues: Skin Integrity Issues:: Stage II Stage I: N/A Stage II: bilateral buttocks  Last BM:  9/26 type 7  Height:   Ht Readings from Last 1 Encounters:  08/19/20 5\' 10"  (1.778 m)    Weight:   Wt Readings  from Last 1 Encounters:  08/27/20 80 kg    BMI:  Body mass index is 25.31 kg/m.  Estimated Nutritional Needs:   Kcal:  2200-2400  Protein:  110-130 gm  Fluid:  2-2.2 L    Lucas Mallow, RD, LDN, CNSC Please refer to Amion for contact information.

## 2020-08-28 NOTE — Progress Notes (Signed)
Gerhardt's cream ordered for buttocks

## 2020-08-29 ENCOUNTER — Inpatient Hospital Stay (HOSPITAL_COMMUNITY): Payer: 59

## 2020-08-29 LAB — COMPREHENSIVE METABOLIC PANEL
ALT: 46 U/L — ABNORMAL HIGH (ref 0–44)
AST: 24 U/L (ref 15–41)
Albumin: 2.1 g/dL — ABNORMAL LOW (ref 3.5–5.0)
Alkaline Phosphatase: 66 U/L (ref 38–126)
Anion gap: 9 (ref 5–15)
BUN: 19 mg/dL (ref 8–23)
CO2: 30 mmol/L (ref 22–32)
Calcium: 8.8 mg/dL — ABNORMAL LOW (ref 8.9–10.3)
Chloride: 92 mmol/L — ABNORMAL LOW (ref 98–111)
Creatinine, Ser: 0.67 mg/dL (ref 0.61–1.24)
GFR calc Af Amer: >60 mL/min (ref >60)
GFR calc non Af Amer: >60 mL/min (ref >60)
Glucose, Bld: 215 mg/dL — ABNORMAL HIGH (ref 70–99)
Potassium: 5.5 mmol/L — ABNORMAL HIGH (ref 3.5–5.1)
Sodium: 131 mmol/L — ABNORMAL LOW (ref 135–145)
Total Bilirubin: 0.5 mg/dL (ref 0.3–1.2)
Total Protein: 5.4 g/dL — ABNORMAL LOW (ref 6.5–8.1)

## 2020-08-29 LAB — CBC
HCT: 38.4 % — ABNORMAL LOW (ref 39.0–52.0)
Hemoglobin: 12.6 g/dL — ABNORMAL LOW (ref 13.0–17.0)
MCH: 28.8 pg (ref 26.0–34.0)
MCHC: 32.8 g/dL (ref 30.0–36.0)
MCV: 87.9 fL (ref 80.0–100.0)
Platelets: 243 10*3/uL (ref 150–400)
RBC: 4.37 MIL/uL (ref 4.22–5.81)
RDW: 13.4 % (ref 11.5–15.5)
WBC: 13.3 10*3/uL — ABNORMAL HIGH (ref 4.0–10.5)
nRBC: 0.3 % — ABNORMAL HIGH (ref 0.0–0.2)

## 2020-08-29 LAB — GLUCOSE, CAPILLARY
Glucose-Capillary: 158 mg/dL — ABNORMAL HIGH (ref 70–99)
Glucose-Capillary: 243 mg/dL — ABNORMAL HIGH (ref 70–99)
Glucose-Capillary: 265 mg/dL — ABNORMAL HIGH (ref 70–99)
Glucose-Capillary: 275 mg/dL — ABNORMAL HIGH (ref 70–99)
Glucose-Capillary: 309 mg/dL — ABNORMAL HIGH (ref 70–99)
Glucose-Capillary: 334 mg/dL — ABNORMAL HIGH (ref 70–99)
Glucose-Capillary: 339 mg/dL — ABNORMAL HIGH (ref 70–99)

## 2020-08-29 LAB — BASIC METABOLIC PANEL
Anion gap: 10 (ref 5–15)
BUN: 23 mg/dL (ref 8–23)
CO2: 30 mmol/L (ref 22–32)
Calcium: 8.9 mg/dL (ref 8.9–10.3)
Chloride: 92 mmol/L — ABNORMAL LOW (ref 98–111)
Creatinine, Ser: 0.84 mg/dL (ref 0.61–1.24)
GFR calc Af Amer: >60 mL/min (ref >60)
GFR calc non Af Amer: >60 mL/min (ref >60)
Glucose, Bld: 276 mg/dL — ABNORMAL HIGH (ref 70–99)
Potassium: 5.4 mmol/L — ABNORMAL HIGH (ref 3.5–5.1)
Sodium: 132 mmol/L — ABNORMAL LOW (ref 135–145)

## 2020-08-29 MED ORDER — ZOLPIDEM TARTRATE 5 MG PO TABS
5.0000 mg | ORAL_TABLET | Freq: Every evening | ORAL | Status: DC | PRN
  Administered 2020-08-29 – 2020-09-10 (×13): 5 mg via ORAL
  Filled 2020-08-29 (×13): qty 1

## 2020-08-29 MED ORDER — ACETAMINOPHEN 325 MG PO TABS
650.0000 mg | ORAL_TABLET | Freq: Four times a day (QID) | ORAL | Status: DC | PRN
  Administered 2020-08-29 – 2020-09-17 (×22): 650 mg via ORAL
  Filled 2020-08-29 (×24): qty 2

## 2020-08-29 MED ORDER — SODIUM ZIRCONIUM CYCLOSILICATE 10 G PO PACK
10.0000 g | PACK | Freq: Once | ORAL | Status: AC
  Administered 2020-08-29: 10 g via ORAL
  Filled 2020-08-29: qty 1

## 2020-08-29 MED ORDER — INSULIN GLARGINE 100 UNIT/ML ~~LOC~~ SOLN
15.0000 [IU] | Freq: Every day | SUBCUTANEOUS | Status: DC
  Administered 2020-08-29: 15 [IU] via SUBCUTANEOUS
  Filled 2020-08-29 (×2): qty 0.15

## 2020-08-29 MED ORDER — OSMOLITE 1.5 CAL PO LIQD
600.0000 mL | ORAL | Status: DC
  Filled 2020-08-29: qty 711

## 2020-08-29 MED ORDER — TAMSULOSIN HCL 0.4 MG PO CAPS
0.4000 mg | ORAL_CAPSULE | Freq: Every day | ORAL | Status: DC
  Administered 2020-08-29 – 2020-09-28 (×31): 0.4 mg via ORAL
  Filled 2020-08-29 (×31): qty 1

## 2020-08-29 NOTE — Progress Notes (Signed)
Chest tube removed and patient tolerated well.  All questioned answered.  RN to continue to monitor.    Richardean Canal RN, BSN, CCRN

## 2020-08-29 NOTE — Progress Notes (Signed)
Physical Therapy Treatment Patient Details Name: Tim Day MRN: [id number] DOB: 07-17-1951 Today's Date: 08/29/2020    History of Present Illness 69 year old with hx of recent COVID pneumonia diagnosis (9/7-9/13, discharge on 2L O2), BL COVID induced DVTs, DM2, GERD HTN presenting with worsening SOB. Per chart found to have left pneumothorax and worsening bilateral infiltrates. Intubated 9/19-9/20     PT Comments    Pt is grateful to have chest tube removed today, and willing to work with therapy. Treatment session limited to bed exercises as pt HR in high 120s at rest. Pt with increased difficulty with dorsiflexion, so worked on [employer] with less and less assist, and in tandem with plantarflexion. D/c plans remain appropriate, and PT will work on progressing mobility in next session.      Follow Up Recommendations  CIR;Supervision/Assistance - 24 hour     Equipment Recommendations  None recommended by PT       Precautions / Restrictions Precautions Precautions: None;Fall Precaution Comments: watch 02, watch HR Restrictions Weight Bearing Restrictions: No    Mobility  Bed Mobility               General bed mobility comments: deferred OOB due to HR in 120s in supine           Cognition Arousal/Alertness: Awake/alert Behavior During Therapy: Anxious;WFL for tasks assessed/performed Overall Cognitive Status: Within Functional Limits for tasks assessed                                        Exercises Low Level/ICU Exercises Ankle Circles/Pumps: AAROM;AROM;15 reps (AROM plantarflex, AAROM dorsiflexion ) Quad Sets: AROM;Both;10 reps;Supine Hip ABduction/ADduction: AROM;Both;10 reps;Supine Heel Slides: AROM;Both;10 reps;Supine    General Comments General comments (skin integrity, edema, etc.): Pt on 25L HFNC on entry with SaO2 in high 90s      Pertinent Vitals/Pain Pain Assessment: Faces Faces Pain Scale: Hurts a little bit Pain  Location: back Pain Descriptors / Indicators: Discomfort;Aching Pain Intervention(s): Limited activity within patient's tolerance;Monitored during session;Repositioned           PT Goals (current goals can now be found in the care plan section) Acute Rehab PT Goals Patient Stated Goal: get better, get home when able PT Goal Formulation: With patient Time For Goal Achievement: 09/08/20 Potential to Achieve Goals: Good Progress towards PT goals: Progressing toward goals    Frequency    Min 3X/week      PT Plan Current plan remains appropriate       AM-PAC PT "6 Clicks" Mobility   Outcome Measure  Help needed turning from your back to your side while in a flat bed without using bedrails?: A Little Help needed moving from lying on your back to sitting on the side of a flat bed without using bedrails?: A Lot Help needed moving to and from a bed to a chair (including a wheelchair)?: Total Help needed standing up from a chair using your arms (e.g., wheelchair or bedside chair)?: Total Help needed to walk in hospital room?: Total Help needed climbing 3-5 steps with a railing? : Total 6 Click Score: 9    End of Session Equipment Utilized During Treatment: Oxygen Activity Tolerance: Patient tolerated treatment well;Patient limited by fatigue Patient left: in bed;with SCD's reapplied Nurse Communication: Mobility status PT Visit Diagnosis: Other abnormalities of gait and mobility (R26.89);Muscle weakness (generalized) (M62.81);Difficulty in walking, not elsewhere  classified (R26.2)     Time: 1423-9532 PT Time Calculation (min) (ACUTE ONLY): 11 min  Charges:  $Therapeutic Exercise: 8-22 mins                     Azazel Franze B. Migdalia Dk PT, DPT Acute Rehabilitation Services Pager [phone] Office [phone]    [place] 08/29/2020, 4:31 PM

## 2020-08-29 NOTE — Progress Notes (Signed)
NAME:  Tim Day, MRN:  [id number], DOB:  [birth year], LOS: 12 ADMISSION DATE:  08/19/2020, CONSULTATION DATE:  9/19 REFERRING MD:  Roderic Palau, CHIEF COMPLAINT:  Dyspnea   Brief History   69 year old with hx of recent COVID diagnosis presenting with worsening hypoxemic resp failure and left pneumothorax.  Past Medical History  GERD HTN DM2 A1c 7.2% Has hx of DVT  09/01/2008 - Occlusive thrombus isolated to a segment of a duplicated brachial  vein in the upper arm.  12/7/20009 - Persistent thrombus in the left brachial vein.  - 12/14/2008 - Small residual thrombus in a duplicate left brachial vein.  - 12/28/2008 - thrombus in the duplicated left brachial  vein -08/09/20 - bilateral LE DVt - Started on eliquis   has a past medical history of Acute respiratory disease, Atypical mole (12/30/2012), Candidiasis of urogenital sites, Diabetes mellitus, Diffuse myofascial pain syndrome, Esophageal reflux, Hypertension, Impacted cerumen of right ear, Melanoma ([place]) (06/14/2011), Mixed hyperlipidemia, MM (malignant melanoma of skin) ([place]) (07/01/2017), Primary insomnia, Seborrheic dermatitis, unspecified, Squamous cell carcinoma of skin (06/14/2011), and Thrombocytopenia, unspecified ([place]).   has a past surgical history that includes Shoulder surgery (2008); Ankle fracture surgery; Abdominal exploration surgery; Colonoscopy (07/13/2012); and left heart catheterization with coronary angiogram (N/A, 01/20/2015).   Significant Hospital Events   9/1 treated for COVID - [place] 9/3 - discharged on 6L [place] xxx 9/4 and 9/5 - decadron and remdesivir opd xxxx  9/7- readmitted with worsening hypoxemia and pneumomediastinum 9/8 -  noted to have bilateral DVT,  9/13 - d/c home 9/13 on O2,  xxx 9/18 - returned with pneumothorax -> 50L - 100% HFNC  9[facility]18 [facility], chest tube exchanged prior to transfer; chest tube occluded, required maniuplation.  9/19 second chest tube placed. Intubated. CVL  placed.   9/20 weaning; appears comfortable on spontaneous breathing trial.  Still having bloody output from both chest tubes, heparin infusion placed on hold for 8 hours.  Extubated. Late in evening oxygen requirements trending up. sats 75% in spite of high flow. XRAY w/ large PTX.. chest tubes not functioning. Lateral chest tube removed. Obstructed w/ old clot. New 28 french chest tube placed.   9/21 still short of breath. airleak 1/7 on lateral tube w/ tidal req high flow and NRB Weaning oxygen again 9/22  9/23 - now in 42m ICU . Trx out of covid isolation.  Not on vent. Not on pressors. Very deconditioned. On HHFNC and FAce mask.  9/28 chest tube discontinued  Consults:  n/a  Procedures:  9/18 Left chest tube 30 Fr 9/21 Left lateral 28 fr chest tube 9/21>>> 9/19: Left anterior chest tube 9/19: Left subclavian triple-lumen catheter 9/19 endotracheal tube > 9/20  Significant Diagnostic Tests:  None 26 2020 one 2D pending>>  Micro Data:  8/25 SARS cov 2 > positive 9/1 SARS cov 2 > positive 9/18 SARS cov 2> positive  9/18 Blood cx x 2>>  Antimicrobials:/Anti covid  9/1 remdesivir > completed course  9/7 baricitinib > 9/13, 9/18 >9/24 (finished 9/24)  9?18 Solumedrol > 9/20, Prednisone 50mg  9/21 >> Zosyn 9/18>>>9/22  Interim history/subjective:  No issues. Requesting ambien to be a nightly med, did ask for it last night. Repeat CXR this AM stable with chest tube clamped overnight  Objective   Blood pressure 114/71, pulse (!) 111, temperature 97.6 F (36.4 C), temperature source Oral, resp. rate (!) 28, height 5\' 10"  (1.778 m), weight 80 kg, SpO2 96 %.    FiO2 (%):  [  30 %-60 %] 60 %   Intake/Output Summary (Last 24 hours) at 08/29/2020 0953 Last data filed at 08/29/2020 0730 Gross per 24 hour  Intake 175 ml  Output 1450 ml  Net -1275 ml   Filed Weights   08/25/20 0500 08/27/20 0536 08/29/20 0550  Weight: 80.5 kg 80 kg 80 kg     General: Frail appearing male,  chronically ill appearing and very deconditioned HEENT: MM pink/moist no JVD or lymphadenopathy is appreciated Neuro: Some confusion but otherwise intact  CV: Heart sounds are distant PULM: Shallow respirations.  Left chest tube without air leak, tube clamped for almost 24 hours now GI: soft, bsx4 active  Extremities: warm/dry, 1+ edema  Skin: no rashes or lesions   Assessment & Plan:   Acute hypoxemic respiratory failure due to COVID ARDS  - Complicated acutely by left pneumothorax.  PTX stable for past 24 hours with chest tube clamped. - Continue supplemental O2 as needed to maintain SpO2 > 90%. - D/c chest tube. - Continue bronchial hygiene. - Mobilize / out of bed as able.   BL LE DVT 08/09/20 (has hx of UE DVT 2009- 2011) - diagnosed in setting of covid - Per primary team on lovenox  Severe deconditioning / frailty. - Would recommend palliative care involvement given his deconditioning and prolonged illness.  If he deteriorates further, he would be a very poor candidate for mechanical ventilation as his chances for liberation and meaningful recovery would be quite slim.   Rest per primary team.  Nothing further to add.  PCCM will sign off.  Please do not hesitate to call us back if we can be of any further assistance.   Montey Hora, [place] [facility] 08/29/2020, 9:53 AM

## 2020-08-29 NOTE — Progress Notes (Signed)
PROGRESS NOTE    Tim Day  WSF:[id number] DOB: August 24, 1951 DOA: 08/19/2020 PCP: Celene Squibb, MD   Chief Complaint  Patient presents with   Covid Positive   Shortness of Breath  Brief Narrative:  69 year old male with history of GERD, hypertension, diabetes mellitus, history of DVT 2009/2010, who presents with worsening hypoxic respiratory failure and left pneumothorax ON 08/19/20. She has found to have worsening bilateral infiltrates chest tube placed at any pain ER and sent to [facility] for further management. Patient was managed in ICU, initially was intubated and since has been extubated and on Heated high flow nasal cannula facemask, has been off Covid unit and transferred to [facility] 9/25  [facility] events 9/1 treated for COVID - [place] 9/3 - discharged on 6L [place] xxx 9/4 and 9/5 - decadron and remdesivir opd xxxx 9/7- readmitted with worsening hypoxemia and pneumomediastinum 9/8 -  noted to have bilateral DVT,  9/13 - d/c home 9/13 on O2,  xxx 9/18 - returned with pneumothorax -> 50L - 100% HFNC 9/18 [facility] to [facility], chest tube exchanged prior to transfer; chest tube occluded, required maniuplation. 9/19 second chest tube placed. Intubated. CVL placed.  9/20 weaning; appears comfortable on spontaneous breathing trial.  Still having bloody output from both chest tubes, heparin infusion placed on hold for 8 hours.  Extubated. Late in evening oxygen requirements trending up. sats 75% in spite of high flow. XRAY w/ large PTX.. chest tubes not functioning. Lateral chest tube removed. Obstructed w/ old clot. New 28 french chest tube placed.  9/21 still short of breath. airleak 1/7 on lateral tube w/ tidal req high flow and NRB Weaning oxygen again 9/22 9/23 - now in 34m ICU . Trx out of covid isolation.  Not on vent. Not on pressors. Very deconditioned. On HHFNC and FAce mask.  9/25 Tx to [facility] on HHFNC/nrb-with worsening hypoxia represents call, PCCM back on rounding team  as consult Seen by critical care and planned for bedtime BiPAP and in the daytime on RRR far off and to hold tube feeds at the time of BiPAP.  Subjective:  Requested not to use BiPAP last night. HFNC 30 L/MIN, 60%. Afebrile overnight. k further up 5.2> 5.5  wbc 13.4 CXR 9/28 shallow inspiration with increasiong infiltration or atelectasis in the lung base   Assessment & Plan:  Acute hypoxic respiratory failure due to COVID ARDS and left pneumothorax: Completed baricitinib 9/24.  Patient remains on high flow nasal cannula/nonrebreather, pulmonary following, chest tube in place. CXR 9/28 shallow inspiration with increasiong infiltration or atelectasis in the lung base.  PCCM continue BiPAP at bedtime.  She did not use last night, continue as needed, continue high flow nasal cannula for comfort.  Afebrile WBC count.  Check serial chest x-rays per pccm.  Left pneumothorax with chest tube clotting with blood, with very small right pneumothorax new as of 9/22 pneumothorax resolved after placing new lateral 28 [place], air leak intermittent on the left side status post removal of apical chest tube.  PCCM following planning to clamp chest tube if 9/28 a.m. chest x-ray okay then remove.  Bilateral lower extremity DVT 08/09/2021 with history of upper extremity DVT in 2009 2010: In the setting of Covid patient and he is on IV heparin-on eliquis PTA.  Currently he is on Lovenox FROM 9.26  Mild hyperkalemia:calcium further trending up.Will dose and Lokelma x1.  Likely from uncontrolled hyperglycemia.    Mild hyponatremia monitor Hypophosphatemia: Repleted and resolved.  EKG changes  on 9/24 and history of chest pain troponin 184>191:Seen by cards no further plan Echo reassuring with normal EF no regional wall motion abnormalities  Benign essential HTN: BP is well controlled, home amlodipine and Benicar HCTZ is on hold.    DM2:Controlled hemoglobin A1c 7.2,on lantus 25 units q am and glipizide5 mg  bid,metformin at home.blood sugar poorly controlled.received 64 units ASPART, WILL add Lantus 15 units since patient is starting to eat, continue linagliptin,NovoLog 3 times and SSI. Recent Labs  Lab 08/28/20 1624 08/28/20 2052 08/29/20 0021 08/29/20 0437 08/29/20 0726  GLUCAP 316* 277* 243* 158* 309*   Lab Results  Component Value Date   HGBA1C 7.2 (H) 08/08/2020   PYK:DXIPJASNKN speech input, diet changed to solid diet, wean off tube feed, augment nutrition-with prestart, ensure- dietitian dietiatian on board, adjust insulin rscale  Pressure ulcer POA as below. Severe protein calorie malnutrition as below, augment diet as above.    Patient is at risk of decompensation.  Continue to monitor closely.  DVT prophylaxis: SCDs Start: 08/19/20 1842Heparin gtt Code Status:   Code Status: Full Code  Family Communication: plan of care discussed with patient and his wife at bedside.  Status is: Inpatient Remains inpatient appropriate because:IV treatments appropriate due to intensity of illness or inability to take PO and ongoing management of acute hypoxic aspiratory failure needing high flow nasal cannula Monitor closely, at risk of decompensation.  Dispo: The patient is from: Home              Anticipated d/c is to: CIR/[facility].  TOC looking into [facility], wife is interested in [place] [facility] than [facility] as some family working in the [employer].              Anticipated d/c date is: > 3 days              Patient currently is not medically stable to d/c. Nutrition: Diet Order            Diet Carb Modified Fluid consistency: Thin; Room service appropriate? Yes  Diet effective now                 Nutrition Problem: Severe Malnutrition Etiology: acute illness (COVID19) Signs/Symptoms: mild fat depletion, mild muscle depletion, moderate muscle depletion, severe muscle depletion, moderate fat depletion, percent weight loss (13.3% weight loss in less than 1 month) Percent weight loss: 13.3  % Interventions: Tube feeding Body mass index is 25.31 kg/m. Pressure Ulcer: Pressure Injury 08/19/20 Buttocks Bilateral Stage 2 -  Partial thickness loss of dermis presenting as a shallow open injury with a red, pink wound bed without slough. (Active)  08/19/20 1853  Location: Buttocks  Location Orientation: Bilateral  Staging: Stage 2 -  Partial thickness loss of dermis presenting as a shallow open injury with a red, pink wound bed without slough.  Wound Description (Comments):   Present on Admission: Yes    Consultants:see note  Procedures:see note Microbiology:see note Blood Culture    Component Value Date/Time   SDES BLOOD LEFT HAND 08/19/2020 1953   SDES BLOOD LEFT ARM 08/19/2020 1953   SPECREQUEST  08/19/2020 1953    BOTTLES DRAWN AEROBIC AND ANAEROBIC Blood Culture adequate volume   SPECREQUEST  08/19/2020 1953    BOTTLES DRAWN AEROBIC ONLY Blood Culture results may not be optimal due to an inadequate volume of blood received in culture bottles   CULT  08/19/2020 1953    NO GROWTH 5 DAYS Performed at [facility], [address] [place], [place] [address]    CULT  08/19/2020 1953    NO GROWTH 5 DAYS Performed at [facility] [address], [place], [place] [address]    REPTSTATUS 08/24/2020 FINAL 08/19/2020 1953   REPTSTATUS 08/24/2020 FINAL 08/19/2020 1953    Other culture-see note  Medications: Scheduled Meds:  chlorhexidine  15 mL Mouth Rinse BID   docusate  100 mg Oral BID   enoxaparin (LOVENOX) injection  80 mg Subcutaneous BID   feeding supplement (ENSURE ENLIVE)  237 mL Oral QID   feeding supplement (OSMOLITE 1.5 CAL)  600 mL Per Tube Q24H   feeding supplement (PROSource TF)  45 mL Per Tube BID   insulin aspart  0-20 Units Subcutaneous Q4H   insulin aspart  4 Units Subcutaneous TID WC   linagliptin  5 mg Oral Daily   mouth rinse  15 mL Mouth Rinse q12n4p   pantoprazole sodium  40 mg Per Tube QHS   polyethylene glycol  17 g  Oral BID   predniSONE  20 mg Oral Q breakfast   Followed by   Derrill Memo ON 08/30/2020] predniSONE  10 mg Oral Q breakfast   Followed by   Derrill Memo ON 08/31/2020] predniSONE  5 mg Oral Q breakfast   Continuous Infusions:  sodium chloride      Antimicrobials: Anti-infectives (From admission, onward)   Start     Dose/Rate Route Frequency Ordered Stop   08/19/20 2000  piperacillin-tazobactam (ZOSYN) IVPB 3.375 g        3.375 g 12.5 mL/hr over 240 Minutes Intravenous Every 8 hours 08/19/20 1850 08/23/20 2359   08/19/20 1845  piperacillin-tazobactam (ZOSYN) IVPB 3.375 g  Status:  Discontinued        3.375 g 100 mL/hr over 30 Minutes Intravenous  Once 08/19/20 1838 08/19/20 1850   08/19/20 0900  vancomycin (VANCOREADY) IVPB 1500 mg/300 mL        1,500 mg 150 mL/hr over 120 Minutes Intravenous  Once 08/19/20 0831 08/19/20 1303   08/19/20 0845  piperacillin-tazobactam (ZOSYN) IVPB 3.375 g        3.375 g 100 mL/hr over 30 Minutes Intravenous  Once 08/19/20 0831 08/19/20 1103     Objective: Vitals: Today's Vitals   08/29/20 0333 08/29/20 0550 08/29/20 0722 08/29/20 0809  BP: 122/81 118/68 114/71 114/71  Pulse: (!) 105 (!) 102 (!) 117 (!) 111  Resp: (!) 24 (!) 26 (!) 27 (!) 28  Temp:  97.6 F (36.4 C) 97.6 F (36.4 C)   TempSrc:  Axillary Oral   SpO2: 97% 97% 99% 96%  Weight:  80 kg    Height:      PainSc:        Intake/Output Summary (Last 24 hours) at 08/29/2020 0813 Last data filed at 08/29/2020 0730 Gross per 24 hour  Intake 175 ml  Output 1450 ml  Net -1275 ml   Filed Weights   08/25/20 0500 08/27/20 0536 08/29/20 0550  Weight: 80.5 kg 80 kg 80 kg   Weight change:   Intake/Output from previous day: 09/27 0701 - 09/28 0700 In: 175 [NG/GT:175] Out: 1250 [Urine:1250] Intake/Output this shift: Total I/O In: -  Out: 200 [Urine:200]  Examination:  General exam: AAO, old for age, NAD, weak appearing. HEENT:Oral mucosa moist, Ear/Nose WNL grossly, dentition  normal. Respiratory system: bilaterally diminished at base, clear upper lungs,no use of accessory muscle. Left chest tube in place/ Cardiovascular system: S1 & S2 +, No JVD,. Gastrointestinal system: Abdomen soft,  NT,ND, BS+ Nervous System:Alert, awake, moving extremities and grossly nonfocal Extremities: No edema, distal peripheral pulses palpable.  Skin: No rashes,no icterus. MSK: Normal muscle bulk,tone, power  Data Reviewed: I have personally reviewed following labs and imaging studies CBC: Recent Labs  Lab 08/23/20 0400 08/23/20 0400 08/24/20 0423 08/25/20 0041 08/27/20 0346 08/28/20 0230 08/29/20 0229  WBC 11.4*   < > 10.5 11.8* 13.1* 14.6* 13.3*  NEUTROABS 9.9*  --  8.8*  --   --   --   --   HGB 12.8*   < > 12.5* 12.2* 12.0* 12.7* 12.6*  HCT 39.7   < > 38.0* 37.2* 36.0* 38.2* 38.4*  MCV 89.4   < > 88.6 89.2 86.5 85.8 87.9  PLT 166   < > 174 167 175 244 243   < > = values in this interval not displayed.   Basic Metabolic Panel: Recent Labs  Lab 08/25/20 0533 08/25/20 0533 08/25/20 1206 08/25/20 1206 08/25/20 1900 08/26/20 0744 08/26/20 1445 08/27/20 0346 08/28/20 0230 08/29/20 0229  NA 134*  --   --   --   --  134*  --  129* 131* 131*  K 4.8  --   --   --   --  5.0  --  4.9 5.2* 5.5*  CL 93*  --   --   --   --  94*  --  94* 94* 92*  CO2 33*  --   --   --   --  31  --  29 29 30   GLUCOSE 80  --   --   --   --  186*  --  131* 171* 215*  BUN 31*  --   --   --   --  22  --  16 16 19   CREATININE 0.65  --   --   --   --  0.76  --  0.68 0.68 0.67  CALCIUM 8.2*  --   --   --   --  8.4*  --  8.3* 8.8* 8.8*  MG 2.4  --  2.1  --  2.1 2.1 2.0  --   --   --   PHOS 2.7   < > 1.9*   < > 2.5 1.8* 2.4* 3.5 3.3  --    < > = values in this interval not displayed.   GFR: Estimated Creatinine Clearance: 92.5 mL/min (by C-G formula based on SCr of 0.67 mg/dL). Liver Function Tests: Recent Labs  Lab 08/25/20 0533 08/26/20 0744 08/27/20 0346 08/28/20 0230 08/29/20 0229   AST 25 30 26 27 24   ALT 68* 57* 48* 51* 46*  ALKPHOS 58 65 62 67 66  BILITOT 0.7 0.9 0.4 0.4 0.5  PROT 5.0* 5.3* 5.0* 5.4* 5.4*  ALBUMIN 2.0* 2.2* 2.1* 2.2* 2.1*   No results for input(s): LIPASE, AMYLASE in the last 168 hours. No results for input(s): AMMONIA in the last 168 hours. Coagulation Profile: No results for input(s): INR, PROTIME in the last 168 hours. Cardiac Enzymes: No results for input(s): CKTOTAL, CKMB, CKMBINDEX, TROPONINI in the last 168 hours. BNP (last 3 results) No results for input(s): PROBNP in the last 8760 hours. HbA1C: No results for input(s): HGBA1C in the last 72 hours. CBG: Recent Labs  Lab 08/28/20 1624 08/28/20 2052 08/29/20 0021 08/29/20 0437 08/29/20 0726  GLUCAP 316* 277* 243* 158* 309*   Lipid Profile: No results for input(s): CHOL, HDL, LDLCALC, TRIG, CHOLHDL, LDLDIRECT in the last 72 hours.  Thyroid Function Tests: No results for input(s): TSH, T4TOTAL, FREET4, T3FREE, THYROIDAB in the last 72 hours. Anemia Panel: No results for input(s): VITAMINB12, FOLATE, FERRITIN, TIBC, IRON, RETICCTPCT in the last 72 hours. Sepsis Labs: Recent Labs  Lab 08/25/20 0533  LATICACIDVEN 0.7    Recent Results (from the past 240 hour(s))  Blood culture (routine x 2)     Status: None   Collection Time: 08/19/20 10:10 AM   Specimen: BLOOD RIGHT WRIST  Result Value Ref Range Status   Specimen Description   Final    BLOOD RIGHT WRIST BOTTLES DRAWN AEROBIC AND ANAEROBIC   Special Requests Blood Culture adequate volume  Final   Culture   Final    NO GROWTH 5 DAYS Performed at [facility], [address] [place], [place] [address]    Report Status 08/24/2020 FINAL  Final  Blood culture (routine x 2)     Status: None   Collection Time: 08/19/20 10:13 AM   Specimen: BLOOD LEFT WRIST  Result Value Ref Range Status   Specimen Description   Final    BLOOD LEFT WRIST BOTTLES DRAWN AEROBIC AND ANAEROBIC   Special Requests Blood Culture adequate volume   Final   Culture   Final    NO GROWTH 5 DAYS Performed at [facility], [address] [place], [place] [address]    Report Status 08/24/2020 FINAL  Final  Culture, blood (routine x 2)     Status: None   Collection Time: 08/19/20  7:53 PM   Specimen: BLOOD LEFT HAND  Result Value Ref Range Status   Specimen Description BLOOD LEFT HAND  Final   Special Requests   Final    BOTTLES DRAWN AEROBIC AND ANAEROBIC Blood Culture adequate volume   Culture   Final    NO GROWTH 5 DAYS Performed at [facility], [place] [address] [place], [place] [address]    Report Status 08/24/2020 FINAL  Final  Culture, blood (routine x 2)     Status: None   Collection Time: 08/19/20  7:53 PM   Specimen: BLOOD LEFT ARM  Result Value Ref Range Status   Specimen Description BLOOD LEFT ARM  Final   Special Requests   Final    BOTTLES DRAWN AEROBIC ONLY Blood Culture results may not be optimal due to an inadequate volume of blood received in culture bottles   Culture   Final    NO GROWTH 5 DAYS Performed at [facility], [place] [address] [place], [place] [address]    Report Status 08/24/2020 FINAL  Final     Radiology Studies: DG Chest Port 1 View  Result Date: 08/29/2020 CLINICAL DATA:  Respiratory failure EXAM: PORTABLE CHEST 1 VIEW COMPARISON:  08/28/2020 FINDINGS: Left chest tube and enteric tube are unchanged in position. Shallow inspiration. Cardiac enlargement. Infiltration or atelectasis in the lung bases suggesting mild progression since previous study. No pneumothorax. IMPRESSION: Shallow inspiration with increasing infiltration or atelectasis in the lung bases. Electronically Signed   By: Lucienne Capers M.D.   On: 08/29/2020 05:19   DG Chest Port 1 View  Result Date: 08/28/2020 CLINICAL DATA:  Pneumothorax EXAM: PORTABLE CHEST 1 VIEW COMPARISON:  08/27/2020 FINDINGS: Stable left chest tube.  No pneumothorax is seen. Bilateral patchy opacities, lower lobe predominant. No pleural effusion.  Heart is normal in size. Enteric tube courses into the mid stomach. IMPRESSION: Stable left chest tube. No pneumothorax is seen. Bilateral patchy opacities, lower lobe predominant. Electronically Signed  By: Julian Hy M.D.   On: 08/28/2020 10:34   DG Chest Port 1 View  Result Date: 08/27/2020 CLINICAL DATA:  68 year old male with shortness of breath, COVID pneumonia and LEFT pneumothorax. EXAM: PORTABLE CHEST 1 VIEW COMPARISON:  08/27/2020 and prior exams. FINDINGS: A small bore feeding tube with tip in the peripyloric region and a LEFT thoracostomy tube again noted. A LEFT thoracostomy tube is unchanged. Bilateral airspace opacities are unchanged. No large pleural effusion or pneumothorax noted. There has been no significant interval change. IMPRESSION: Unchanged appearance of the chest with bilateral airspace opacities. No pneumothorax. Electronically Signed   By: Margarette Canada M.D.   On: 08/27/2020 11:50     LOS: 10 days   Antonieta Pert, MD [facility]  08/29/2020, 8:13 AM

## 2020-08-29 NOTE — Progress Notes (Signed)
Patient requested earlier in the night not to wear the BIPAP.  Visit made to patients room again for assessment and to offer BIPAP patient was resting well vital signs were great I did not disturb due to patient stating he didn't want to wear earlier and no distress noted. RN and RT discussed and will reassess throughout the shift.

## 2020-08-29 NOTE — Progress Notes (Signed)
RN sent message to Reece Levy, MD to notify of HR sustaining in 120s. Received callback with orders for EKG and Chest Xray.  RN notified MD upon completion.

## 2020-08-30 LAB — BASIC METABOLIC PANEL
Anion gap: 8 (ref 5–15)
Anion gap: 10 (ref 5–15)
BUN: 19 mg/dL (ref 8–23)
BUN: 22 mg/dL (ref 8–23)
CO2: 36 mmol/L — ABNORMAL HIGH (ref 22–32)
CO2: 31 mmol/L (ref 22–32)
Calcium: 8.9 mg/dL (ref 8.9–10.3)
Calcium: 8.7 mg/dL — ABNORMAL LOW (ref 8.9–10.3)
Chloride: 90 mmol/L — ABNORMAL LOW (ref 98–111)
Chloride: 89 mmol/L — ABNORMAL LOW (ref 98–111)
Creatinine, Ser: 0.67 mg/dL (ref 0.61–1.24)
Creatinine, Ser: 0.7 mg/dL (ref 0.61–1.24)
GFR calc Af Amer: >60 mL/min (ref >60)
GFR calc Af Amer: >60 mL/min (ref >60)
GFR calc non Af Amer: >60 mL/min (ref >60)
GFR calc non Af Amer: >60 mL/min (ref >60)
Glucose, Bld: 185 mg/dL — ABNORMAL HIGH (ref 70–99)
Glucose, Bld: 365 mg/dL — ABNORMAL HIGH (ref 70–99)
Potassium: 5.2 mmol/L — ABNORMAL HIGH (ref 3.5–5.1)
Potassium: 5.8 mmol/L — ABNORMAL HIGH (ref 3.5–5.1)
Sodium: 134 mmol/L — ABNORMAL LOW (ref 135–145)
Sodium: 130 mmol/L — ABNORMAL LOW (ref 135–145)

## 2020-08-30 LAB — CBC
HCT: 37 % — ABNORMAL LOW (ref 39.0–52.0)
Hemoglobin: 12.1 g/dL — ABNORMAL LOW (ref 13.0–17.0)
MCH: 29.7 pg (ref 26.0–34.0)
MCHC: 32.7 g/dL (ref 30.0–36.0)
MCV: 90.7 fL (ref 80.0–100.0)
Platelets: 205 10*3/uL (ref 150–400)
RBC: 4.08 MIL/uL — ABNORMAL LOW (ref 4.22–5.81)
RDW: 13.7 % (ref 11.5–15.5)
WBC: 12.2 10*3/uL — ABNORMAL HIGH (ref 4.0–10.5)
nRBC: 0.3 % — ABNORMAL HIGH (ref 0.0–0.2)

## 2020-08-30 LAB — GLUCOSE, CAPILLARY
Glucose-Capillary: 173 mg/dL — ABNORMAL HIGH (ref 70–99)
Glucose-Capillary: 186 mg/dL — ABNORMAL HIGH (ref 70–99)
Glucose-Capillary: 303 mg/dL — ABNORMAL HIGH (ref 70–99)
Glucose-Capillary: 319 mg/dL — ABNORMAL HIGH (ref 70–99)
Glucose-Capillary: 372 mg/dL — ABNORMAL HIGH (ref 70–99)

## 2020-08-30 MED ORDER — INSULIN ASPART 100 UNIT/ML ~~LOC~~ SOLN
6.0000 [IU] | Freq: Three times a day (TID) | SUBCUTANEOUS | Status: DC
  Administered 2020-08-30 (×2): 6 [IU] via SUBCUTANEOUS

## 2020-08-30 MED ORDER — INSULIN ASPART 100 UNIT/ML ~~LOC~~ SOLN: 10.0000 [IU] | Freq: Once | SUBCUTANEOUS | Status: DC

## 2020-08-30 MED ORDER — INSULIN GLARGINE 100 UNIT/ML ~~LOC~~ SOLN
25.0000 [IU] | Freq: Every day | SUBCUTANEOUS | Status: DC
  Administered 2020-08-30: 25 [IU] via SUBCUTANEOUS
  Filled 2020-08-30 (×2): qty 0.25

## 2020-08-30 MED ORDER — SODIUM ZIRCONIUM CYCLOSILICATE 10 G PO PACK
10.0000 g | PACK | Freq: Every day | ORAL | Status: AC
  Administered 2020-08-30 – 2020-08-31 (×2): 10 g via ORAL
  Filled 2020-08-30 (×3): qty 1

## 2020-08-30 MED ORDER — INSULIN ASPART 100 UNIT/ML ~~LOC~~ SOLN
0.0000 [IU] | Freq: Three times a day (TID) | SUBCUTANEOUS | Status: DC
  Administered 2020-08-30 (×2): 11 [IU] via SUBCUTANEOUS
  Administered 2020-08-30: 3 [IU] via SUBCUTANEOUS
  Administered 2020-08-31: 2 [IU] via SUBCUTANEOUS
  Administered 2020-08-31: 8 [IU] via SUBCUTANEOUS
  Administered 2020-08-31: 15 [IU] via SUBCUTANEOUS
  Administered 2020-09-01 (×2): 8 [IU] via SUBCUTANEOUS
  Administered 2020-09-01: 11 [IU] via SUBCUTANEOUS
  Administered 2020-09-02 (×2): 3 [IU] via SUBCUTANEOUS
  Administered 2020-09-02: 11 [IU] via SUBCUTANEOUS
  Administered 2020-09-03: 5 [IU] via SUBCUTANEOUS
  Administered 2020-09-03: 8 [IU] via SUBCUTANEOUS
  Administered 2020-09-03: 5 [IU] via SUBCUTANEOUS
  Administered 2020-09-04: 11 [IU] via SUBCUTANEOUS
  Administered 2020-09-04 – 2020-09-05 (×4): 5 [IU] via SUBCUTANEOUS
  Administered 2020-09-05: 11 [IU] via SUBCUTANEOUS
  Administered 2020-09-06: 8 [IU] via SUBCUTANEOUS
  Administered 2020-09-06: 3 [IU] via SUBCUTANEOUS
  Administered 2020-09-06: 8 [IU] via SUBCUTANEOUS
  Administered 2020-09-07: 3 [IU] via SUBCUTANEOUS
  Administered 2020-09-07 (×2): 8 [IU] via SUBCUTANEOUS
  Administered 2020-09-08: 3 [IU] via SUBCUTANEOUS
  Administered 2020-09-08: 5 [IU] via SUBCUTANEOUS
  Administered 2020-09-08: 2 [IU] via SUBCUTANEOUS
  Administered 2020-09-09 (×2): 5 [IU] via SUBCUTANEOUS
  Administered 2020-09-09: 2 [IU] via SUBCUTANEOUS
  Administered 2020-09-10 (×3): 5 [IU] via SUBCUTANEOUS
  Administered 2020-09-11: 3 [IU] via SUBCUTANEOUS
  Administered 2020-09-11 – 2020-09-13 (×5): 5 [IU] via SUBCUTANEOUS
  Administered 2020-09-13: 3 [IU] via SUBCUTANEOUS
  Administered 2020-09-13 – 2020-09-14 (×2): 5 [IU] via SUBCUTANEOUS
  Administered 2020-09-14: 3 [IU] via SUBCUTANEOUS
  Administered 2020-09-15: 2 [IU] via SUBCUTANEOUS
  Administered 2020-09-16: 3 [IU] via SUBCUTANEOUS
  Administered 2020-09-16: 2 [IU] via SUBCUTANEOUS
  Administered 2020-09-17: 8 [IU] via SUBCUTANEOUS
  Administered 2020-09-17: 3 [IU] via SUBCUTANEOUS
  Administered 2020-09-18: 11 [IU] via SUBCUTANEOUS
  Administered 2020-09-18: 5 [IU] via SUBCUTANEOUS
  Administered 2020-09-18: 6 [IU] via SUBCUTANEOUS
  Administered 2020-09-19 (×2): 5 [IU] via SUBCUTANEOUS
  Administered 2020-09-19: 3 [IU] via SUBCUTANEOUS
  Administered 2020-09-20: 5 [IU] via SUBCUTANEOUS
  Administered 2020-09-20: 3 [IU] via SUBCUTANEOUS
  Administered 2020-09-20: 5 [IU] via SUBCUTANEOUS
  Administered 2020-09-21: 2 [IU] via SUBCUTANEOUS
  Administered 2020-09-21: 11 [IU] via SUBCUTANEOUS
  Administered 2020-09-21: 3 [IU] via SUBCUTANEOUS
  Administered 2020-09-22 (×2): 5 [IU] via SUBCUTANEOUS
  Administered 2020-09-22: 2 [IU] via SUBCUTANEOUS
  Administered 2020-09-23 (×2): 3 [IU] via SUBCUTANEOUS
  Administered 2020-09-23: 5 [IU] via SUBCUTANEOUS
  Administered 2020-09-24: 3 [IU] via SUBCUTANEOUS
  Administered 2020-09-24: 8 [IU] via SUBCUTANEOUS
  Administered 2020-09-24: 3 [IU] via SUBCUTANEOUS
  Administered 2020-09-25: 5 [IU] via SUBCUTANEOUS
  Administered 2020-09-25: 8 [IU] via SUBCUTANEOUS
  Administered 2020-09-26 (×2): 3 [IU] via SUBCUTANEOUS
  Administered 2020-09-26 – 2020-09-27 (×2): 5 [IU] via SUBCUTANEOUS
  Administered 2020-09-27: 3 [IU] via SUBCUTANEOUS
  Administered 2020-09-28: 8 [IU] via SUBCUTANEOUS
  Administered 2020-09-28: 5 [IU] via SUBCUTANEOUS

## 2020-08-30 MED ORDER — INSULIN GLARGINE 100 UNIT/ML ~~LOC~~ SOLN
5.0000 [IU] | Freq: Once | SUBCUTANEOUS | Status: DC
  Filled 2020-08-30: qty 0.05

## 2020-08-30 MED ORDER — INSULIN ASPART 100 UNIT/ML ~~LOC~~ SOLN
10.0000 [IU] | Freq: Three times a day (TID) | SUBCUTANEOUS | Status: DC
  Administered 2020-08-31 – 2020-09-03 (×9): 10 [IU] via SUBCUTANEOUS

## 2020-08-30 MED ORDER — INSULIN ASPART 100 UNIT/ML ~~LOC~~ SOLN
0.0000 [IU] | Freq: Every day | SUBCUTANEOUS | Status: DC
  Administered 2020-08-30 – 2020-09-01 (×2): 5 [IU] via SUBCUTANEOUS
  Administered 2020-09-03 – 2020-09-26 (×8): 2 [IU] via SUBCUTANEOUS
  Administered 2020-09-28: 3 [IU] via SUBCUTANEOUS

## 2020-08-30 MED ORDER — INSULIN GLARGINE 100 UNIT/ML ~~LOC~~ SOLN
15.0000 [IU] | Freq: Two times a day (BID) | SUBCUTANEOUS | Status: DC
  Administered 2020-08-30 – 2020-08-31 (×2): 15 [IU] via SUBCUTANEOUS
  Filled 2020-08-30 (×3): qty 0.15

## 2020-08-30 MED ORDER — PHENOL 1.4 % MT LIQD
1.0000 | OROMUCOSAL | Status: DC | PRN
  Administered 2020-08-31: 1 via OROMUCOSAL
  Filled 2020-08-30: qty 177

## 2020-08-30 NOTE — TOC Progression Note (Signed)
Transition of Care [facility]) - Progression Note    Patient Details  Name: Tim Day MRN: [id number] Date of Birth: 05/15/1951  [facility] CM/SW Contact  Graves-Bigelow, Ocie Cornfield, RN Phone Number: 08/30/2020, 12:39 PM  Clinical Narrative: Case Manager spoke with patient and wife this morning regarding LTAC. Patient and wife [facility]. [facility] has a waiting list at this time and is not accepting any new patients. Case Manager did discuss [facility] and wife does not want the patient to go to [facility] at this time. Patient continues on 25 liters heated high flow 02. Case Manager will continue to follow for transition of care needs.   Expected Discharge Plan: [place] Barriers to Discharge: Continued Medical Work up  Expected Discharge Plan and Services Expected Discharge Plan: [place] In-house Referral: Clinical Social Work Discharge Planning Services: CM Consult   Living arrangements for the past 2 months: Single Family Home                   Readmission Risk Interventions No flowsheet data found.

## 2020-08-30 NOTE — Progress Notes (Signed)
PROGRESS NOTE    Tim Day  GYJ:[id number] DOB: 11-20-1951 DOA: 08/19/2020 PCP: Celene Squibb, MD   Chief Complaint  Patient presents with  . Covid Positive  . 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 She has found to have worsening bilateral infiltrates chest tube placed at any pain ER and sent to [facility] for further management. Patient was managed in ICU, initially was intubated and since has been extubated and on Heated high flow nasal cannula facemask, has been off Covid unit and transferred to [facility] 9/25. B dependent on high flow nasal cannula, had left chest tube subsequently removed 9/28 by [facility] events 9/1 treated for COVID - [place] 9/3 - discharged on 6L [place] xxx 9/4 and 9/5 - decadron and remdesivir opd xxxx 9/7- readmitted with worsening hypoxemia and pneumomediastinum 9/8 -  noted to have bilateral DVT,  9/13 - d/c home 9/13 on O2,  xxx 9/18 - returned with pneumothorax -> 50L - 100% HFNC 9/18 [facility] to [facility], chest tube exchanged prior to transfer; chest tube occluded, required maniuplation. 9/19 second chest tube placed. Intubated. CVL placed.  9/20 weaning; appears comfortable on spontaneous breathing trial.  Still having bloody output from both chest tubes, heparin infusion placed on hold for 8 hours.  Extubated. Late in evening oxygen requirements trending up. sats 75% in spite of high flow. XRAY w/ large PTX.. chest tubes not functioning. Lateral chest tube removed. Obstructed w/ old clot. New 28 french chest tube placed.  9/21 still short of breath. airleak 1/7 on lateral tube w/ tidal req high flow and NRB Weaning oxygen again 9/22 9/23 - now in 66m ICU . Trx out of covid isolation.  Not on vent. Not on pressors. Very deconditioned. On HHFNC and FAce mask.    9/25 Tx to [facility] on HHFNC/nrb-with worsening hypoxia represents call, PCCM back on rounding team as consult Seen by critical care and planned for bedtime BiPAP and in the daytime on RRR far off and to hold tube feeds at the time of BiPAP. 9/28- left chest tube removed.  During the night patient has not been using BiPAP and feels comfortable on high flow nasal.  Repeat chest x-ray no evidence of residual pneumothorax, persistent moderate to marked severity bibasilar atelectasis and/or infiltrate  Subjective:  Tachycardic overnight in 120s, x-ray with bibasilar infiltrates severe. Still on HFNC at 25l.Overnight did not like to use bipap.  Was using nonrebreather intermittently on top of high flow nasal cannula. wbc 13.4> 12.2 donwtrending, afebrile CXR 9/28 shallow inspiration with increasiong infiltration or atelectasis in the lung base  Wife at the bedside.  Somewhat more sleepy this morning she says he received 5 mg melatonin and Ambien last night.  Patient says he  normally takes 10 of Ambien at home-stop his melatonin, if still sleepy- cut down ambien.  Assessment & Plan:  Acute hypoxic respiratory failure due to COVID ARDS and left pneumothorax: Completed covid 19 pneumonia treatment including baricitinib 9/24.  On high flow nasal cannula at 25l- also needing nonrebreather mask intermittently and bedtime BiPAP(not using bedtime BiPAP for few nights) Off chest tube  On left 9/28, pccm signed off.  He does have bibasilar severe infiltrates likely post Covid.  Continue respiratory support wean oxygen as tolerated with goal to keep him above 88% and can use mask/BiPAP for comfort.  patient is afebrile. Cont pt/ot  Left pneumothorax with chest tube clotting with blood, with very small right pneumothorax- pneumothorax resolved after placing new lateral 28 [place], , s/p removal of apical chest tube.  PCCM following and 9/28- left lateral chest tube also removed.Repeat chest x-ray 9/28 night  no evidence  of residual pneumothorax, persistent moderate to marked severity bibasilar atelectasis and/or infiltrate  Bilateral lower extremity DVT 08/09/2021 with history of upper extremity DVT in 2009 2010:was o nEliquis at home - here iv heparin switched to lovenox 9/26  Mild hyperkalemia: In the setting of hyperglycemia.  Status post [facility] 9/28.  Potassium slowly trending down.  Recent Labs  Lab 08/27/20 0346 08/28/20 0230 08/29/20 0229 08/29/20 1628 08/30/20 0542  K 4.9 5.2* 5.5* 5.4* 5.2*   Mild hyponatremia monitor Hypophosphatemia: Repleted and resolved.  EKG changes on 9/24 and history of chest pain troponin 184>191:Seen by cardiology  no further plan Echo reassuring with normal EF no regional wall motion abnormalities  Benign essential HTN: BP is well controlled, home amlodipine and Benicar HCTZ is on hold.    DM2:Controlled hemoglobin A1c 7.2,on lantus 25 units q am and glipizide5 mg bid,metformin at home.blood sugar fluctuating as patient's oral intake is picking up, tube feed held 9/28.  Patient received 83 units aspart 9/28- will  increase Lantus to 25 units , cont linagliptin, increase NovoLog  6 u tid and ssi achs. His po intake is picking up, monitor cbg closely.  Recent Labs  Lab 08/29/20 1152 08/29/20 1620 08/29/20 2042 08/29/20 2342 08/30/20 0432  GLUCAP 334* 275* 339* 265* 173*   Lab Results  Component Value Date   HGBA1C 7.2 (H) 08/08/2020   ZWC:HENIDPOEUM speech input.  Augment nutrition, appreciate dietitian input.  Pressure ulcer POA as below. Severe protein calorie malnutrition as below, augment diet as above.    Patient is at risk of decompensation.  Continue to monitor closely.  DVT prophylaxis: SCDs Start: 08/19/20 1842Heparin gtt Code Status:   Code Status: Full Code  Family Communication: plan of care discussed with patient and his wife at bedside.  Status is: Inpatient Remains inpatient appropriate because:IV treatments appropriate due to  intensity of illness or inability to take PO and ongoing management of acute hypoxic aspiratory failure needing high flow nasal cannula Monitor closely, at risk of decompensation.  Dispo: The patient is from: Home              Anticipated d/c is to: CIR/LTACH.  TOC looking into [facility], wife is interested in [facility] than [facility] as some family working in the [employer].              Anticipated d/c date is: > 3 days              Patient currently is not medically stable to d/c. Nutrition: Diet Order            Diet Carb Modified Fluid consistency: Thin; Room service appropriate? Yes  Diet effective now                 Nutrition Problem: Severe Malnutrition Etiology: acute illness (COVID19) Signs/Symptoms: mild fat depletion, mild muscle depletion, moderate muscle depletion, severe muscle depletion, moderate fat depletion, percent weight loss (13.3% weight loss in less than 1 month) Percent weight loss: 13.3 % Interventions: Tube feeding Body mass index is 25.31 kg/m. Pressure Ulcer: Pressure Injury 08/19/20 Buttocks Bilateral Stage 2 -  Partial thickness loss of dermis presenting as a shallow open injury  with a red, pink wound bed without slough. (Active)  08/19/20 1853  Location: Buttocks  Location Orientation: Bilateral  Staging: Stage 2 -  Partial thickness loss of dermis presenting as a shallow open injury with a red, pink wound bed without slough.  Wound Description (Comments):   Present on Admission: Yes    Consultants:see note  Procedures:see note Microbiology:see note Blood Culture    Component Value Date/Time   SDES BLOOD LEFT HAND 08/19/2020 1953   SDES BLOOD LEFT ARM 08/19/2020 1953   SPECREQUEST  08/19/2020 1953    BOTTLES DRAWN AEROBIC AND ANAEROBIC Blood Culture adequate volume   SPECREQUEST  08/19/2020 1953    BOTTLES DRAWN AEROBIC ONLY Blood Culture results may not be optimal due to an inadequate volume of blood received in culture bottles   CULT   08/19/2020 1953    NO GROWTH 5 DAYS Performed at [facility], [place] [address], [place], [place] [address]    CULT  08/19/2020 1953    NO GROWTH 5 DAYS Performed at [facility] [address], [place], [place] [address]    REPTSTATUS 08/24/2020 FINAL 08/19/2020 1953   REPTSTATUS 08/24/2020 FINAL 08/19/2020 1953    Other culture-see note  Medications: Scheduled Meds: . chlorhexidine  15 mL Mouth Rinse BID  . docusate  100 mg Oral BID  . enoxaparin (LOVENOX) injection  80 mg Subcutaneous BID  . feeding supplement (ENSURE ENLIVE)  237 mL Oral QID  . feeding supplement (PROSource TF)  45 mL Per Tube BID  . insulin aspart  0-20 Units Subcutaneous Q4H  . insulin aspart  4 Units Subcutaneous TID WC  . insulin glargine  15 Units Subcutaneous Daily  . linagliptin  5 mg Oral Daily  . mouth rinse  15 mL Mouth Rinse q12n4p  . pantoprazole sodium  40 mg Per Tube QHS  . polyethylene glycol  17 g Oral BID  . predniSONE  10 mg Oral Q breakfast   Followed by  . [START ON 08/31/2020] predniSONE  5 mg Oral Q breakfast  . tamsulosin  0.4 mg Oral Daily   Continuous Infusions: . sodium chloride      Antimicrobials: Anti-infectives (From admission, onward)   Start     Dose/Rate Route Frequency Ordered Stop   08/19/20 2000  piperacillin-tazobactam (ZOSYN) IVPB 3.375 g        3.375 g 12.5 mL/hr over 240 Minutes Intravenous Every 8 hours 08/19/20 1850 08/23/20 2359   08/19/20 1845  piperacillin-tazobactam (ZOSYN) IVPB 3.375 g  Status:  Discontinued        3.375 g 100 mL/hr over 30 Minutes Intravenous  Once 08/19/20 1838 08/19/20 1850   08/19/20 0900  vancomycin (VANCOREADY) IVPB 1500 mg/300 mL        1,500 mg 150 mL/hr over 120 Minutes Intravenous  Once 08/19/20 0831 08/19/20 1303   08/19/20 0845  piperacillin-tazobactam (ZOSYN) IVPB 3.375 g        3.375 g 100 mL/hr over 30 Minutes Intravenous  Once 08/19/20 0831 08/19/20 1103     Objective: Vitals: Today's Vitals   08/29/20  2342 08/30/20 0116 08/30/20 0433 08/30/20 0741  BP: 109/72 109/72 113/76   Pulse: (!) 119 (!) 124 (!) 115 (!) 111  Resp: (!) 28 (!) 27 (!) 32 (!) 27  Temp: 99.9 F (37.7 C)  98.3 F (36.8 C)   TempSrc: Oral  Oral   SpO2: 99% 94% 100% 99%  Weight:      Height:  PainSc:        Intake/Output Summary (Last 24 hours) at 08/30/2020 0808 Last data filed at 08/29/2020 2345 Gross per 24 hour  Intake 720 ml  Output 1650 ml  Net -930 ml   Filed Weights   08/25/20 0500 08/27/20 0536 08/29/20 0550  Weight: 80.5 kg 80 kg 80 kg   Weight change:   Intake/Output from previous day: 09/28 0701 - 09/29 0700 In: 960 [P.O.:960] Out: 2050 [Urine:2050] Intake/Output this shift: No intake/output data recorded.  Examination:  General exam: AAOx3, weak, NAD, weak appearing. HEENT:Oral mucosa moist, Ear/Nose WNL grossly, dentition normal. Respiratory system: bilaterally crackles at the base clear upper lungs, no wheezing, no use of respiratory accessory muscle no tachypnea. Cardiovascular system: S1 & S2 +, No JVD,. Gastrointestinal system: Abdomen soft, NT,ND, BS+ Nervous System:Alert, awake, moving extremities and grossly nonfocal Extremities: No edema, distal peripheral pulses palpable.  Skin: No rashes,no icterus. MSK: Normal muscle bulk,tone, power  Data Reviewed: I have personally reviewed following labs and imaging studies CBC: Recent Labs  Lab 08/24/20 0423 08/24/20 0423 08/25/20 0041 08/27/20 0346 08/28/20 0230 08/29/20 0229 08/30/20 0542  WBC 10.5   < > 11.8* 13.1* 14.6* 13.3* 12.2*  NEUTROABS 8.8*  --   --   --   --   --   --   HGB 12.5*   < > 12.2* 12.0* 12.7* 12.6* 12.1*  HCT 38.0*   < > 37.2* 36.0* 38.2* 38.4* 37.0*  MCV 88.6   < > 89.2 86.5 85.8 87.9 90.7  PLT 174   < > 167 175 244 243 205   < > = values in this interval not displayed.   Basic Metabolic Panel: Recent Labs  Lab 08/25/20 0533 08/25/20 0533 08/25/20 1206 08/25/20 1206 08/25/20 1900  08/26/20 0744 08/26/20 0744 08/26/20 1445 08/27/20 0346 08/28/20 0230 08/29/20 0229 08/29/20 1628 08/30/20 0542  NA 134*   < >  --   --   --  134*   < >  --  129* 131* 131* 132* 134*  K 4.8   < >  --   --   --  5.0   < >  --  4.9 5.2* 5.5* 5.4* 5.2*  CL 93*   < >  --   --   --  94*   < >  --  94* 94* 92* 92* 90*  CO2 33*   < >  --   --   --  31   < >  --  29 29 30 30  36*  GLUCOSE 80   < >  --   --   --  186*   < >  --  131* 171* 215* 276* 185*  BUN 31*   < >  --   --   --  22   < >  --  16 16 19 23 19   CREATININE 0.65   < >  --   --   --  0.76   < >  --  0.68 0.68 0.67 0.84 0.67  CALCIUM 8.2*   < >  --   --   --  8.4*   < >  --  8.3* 8.8* 8.8* 8.9 8.9  MG 2.4  --  2.1  --  2.1 2.1  --  2.0  --   --   --   --   --   PHOS 2.7   < > 1.9*   < > 2.5 1.8*  --  2.4* 3.5 3.3  --   --   --    < > = values in this interval not displayed.   GFR: Estimated Creatinine Clearance: 92.5 mL/min (by C-G formula based on SCr of 0.67 mg/dL). Liver Function Tests: Recent Labs  Lab 08/25/20 0533 08/26/20 0744 08/27/20 0346 08/28/20 0230 08/29/20 0229  AST 25 30 26 27 24   ALT 68* 57* 48* 51* 46*  ALKPHOS 58 65 62 67 66  BILITOT 0.7 0.9 0.4 0.4 0.5  PROT 5.0* 5.3* 5.0* 5.4* 5.4*  ALBUMIN 2.0* 2.2* 2.1* 2.2* 2.1*   No results for input(s): LIPASE, AMYLASE in the last 168 hours. No results for input(s): AMMONIA in the last 168 hours. Coagulation Profile: No results for input(s): INR, PROTIME in the last 168 hours. Cardiac Enzymes: No results for input(s): CKTOTAL, CKMB, CKMBINDEX, TROPONINI in the last 168 hours. BNP (last 3 results) No results for input(s): PROBNP in the last 8760 hours. HbA1C: No results for input(s): HGBA1C in the last 72 hours. CBG: Recent Labs  Lab 08/29/20 1152 08/29/20 1620 08/29/20 2042 08/29/20 2342 08/30/20 0432  GLUCAP 334* 275* 339* 265* 173*   Lipid Profile: No results for input(s): CHOL, HDL, LDLCALC, TRIG, CHOLHDL, LDLDIRECT in the last 72  hours. Thyroid Function Tests: No results for input(s): TSH, T4TOTAL, FREET4, T3FREE, THYROIDAB in the last 72 hours. Anemia Panel: No results for input(s): VITAMINB12, FOLATE, FERRITIN, TIBC, IRON, RETICCTPCT in the last 72 hours. Sepsis Labs: Recent Labs  Lab 08/25/20 0533  LATICACIDVEN 0.7    No results found for this or any previous visit (from the past 240 hour(s)).   Radiology Studies: DG CHEST PORT 1 VIEW  Result Date: 08/29/2020 CLINICAL DATA:  Shortness of breath. EXAM: PORTABLE CHEST 1 VIEW COMPARISON:  August 29, 2020 (4:46 a.m.) FINDINGS: There is stable nasogastric tube positioning. The left-sided chest tube seen on the prior study has been removed. Persistent moderate to marked severity areas of atelectasis and/or infiltrate are seen within the bilateral lung bases. There is no evidence of a pleural effusion or residual pneumothorax. The cardiac silhouette is mildly enlarged. Degenerative changes seen throughout the thoracic spine. IMPRESSION: 1. Interval left-sided chest tube removal without evidence of a residual pneumothorax. 2. Persistent moderate to marked severity bibasilar atelectasis and/or infiltrate. Electronically Signed   By: Virgina Norfolk M.D.   On: 08/29/2020 23:14   DG Chest Port 1 View  Result Date: 08/29/2020 CLINICAL DATA:  Respiratory failure EXAM: PORTABLE CHEST 1 VIEW COMPARISON:  08/28/2020 FINDINGS: Left chest tube and enteric tube are unchanged in position. Shallow inspiration. Cardiac enlargement. Infiltration or atelectasis in the lung bases suggesting mild progression since previous study. No pneumothorax. IMPRESSION: Shallow inspiration with increasing infiltration or atelectasis in the lung bases. Electronically Signed   By: Lucienne Capers M.D.   On: 08/29/2020 05:19   DG Chest Port 1 View  Result Date: 08/28/2020 CLINICAL DATA:  Pneumothorax EXAM: PORTABLE CHEST 1 VIEW COMPARISON:  08/27/2020 FINDINGS: Stable left chest tube.  No  pneumothorax is seen. Bilateral patchy opacities, lower lobe predominant. No pleural effusion. Heart is normal in size. Enteric tube courses into the mid stomach. IMPRESSION: Stable left chest tube. No pneumothorax is seen. Bilateral patchy opacities, lower lobe predominant. Electronically Signed   By: Julian Hy M.D.   On: 08/28/2020 10:34     LOS: 11 days   Antonieta Pert, MD [facility]  08/30/2020, 8:08 AM

## 2020-08-30 NOTE — Progress Notes (Addendum)
Nutrition Follow-up   DOCUMENTATION CODES:   Severe malnutrition in context of acute illness/injury  INTERVENTION:    Continue Ensure Enlive po QID, each supplement provides 350 kcal and 20 grams of protein   Continue Magic cup TID with meals, each supplement provides 290 kcal and 9 grams of protein   Add pudding with all meals per patient request   Discontinue tube feeding  NUTRITION DIAGNOSIS:   Severe Malnutrition related to acute illness (COVID19) as evidenced by mild fat depletion, mild muscle depletion, moderate muscle depletion, severe muscle depletion, moderate fat depletion, percent weight loss (13.3% weight loss in less than 1 month).  Ongoing   GOAL:   Patient will meet greater than or equal to 90% of their needs  Progressing   MONITOR:   TF tolerance, PO intake, Labs, Skin  REASON FOR ASSESSMENT:   Consult, Ventilator Enteral/tube feeding initiation and management  ASSESSMENT:   69 yo male admitted with recent COVID-19 diagnosis with worsening respiratory failure and left pneumothorax.  PMH includes DM, HTN, GERD.  Chest tube was removed 9/28. Diet was advanced to CHO modified, soft diet, with chopped meats on 9/27.  Meal intakes documented at 50-100%.   Spoke with patient's wife over the phone. She said that SLP just left room and his swallowing function is normal. He has been eating very well. Ate ~75% of breakfast this morning. He likes chocolate pudding and has been requesting it with meals. He is receiving Magic cup supplements with meals. He has been drinking Ensure Enlive supplements QID. Nocturnal tube feeding was held last night because he has been eating so well.   Intake from supplements and meals is meeting patient's nutrition needs. Will discontinue TF.   Patient remains on HFNC. He has not been needing BiPAP.   Labs reviewed. Na 134, K 5.2 CBG: [phone]  Medications reviewed and include Novolog, Lantus, Tradjenta, Prednisone,  Flomax.  Weight remains stable at 80 kg.  NUTRITION - FOCUSED PHYSICAL EXAM:    Most Recent Value  Orbital Region Mild depletion  Upper Arm Region Severe depletion  Thoracic and Lumbar Region Moderate depletion  Buccal Region Mild depletion  Temple Region Mild depletion  Clavicle Bone Region Severe depletion  Clavicle and Acromion Bone Region Severe depletion  Scapular Bone Region Moderate depletion  Dorsal Hand Moderate depletion  Patellar Region Severe depletion  Anterior Thigh Region Severe depletion  Posterior Calf Region Severe depletion  Edema (RD Assessment) None  Hair Reviewed  Eyes Reviewed  Mouth Reviewed  Skin Reviewed  Nails Reviewed       Diet Order:   Diet Order            Diet Carb Modified Fluid consistency: Thin; Room service appropriate? Yes  Diet effective now                 EDUCATION NEEDS:   Not appropriate for education at this time  Skin:  Skin Assessment: Skin Integrity Issues: Skin Integrity Issues:: Stage II Stage I: N/A Stage II: bilateral buttocks  Last BM:  9/26 type 7  Height:   Ht Readings from Last 1 Encounters:  08/19/20 5\' 10"  (1.778 m)    Weight:   Wt Readings from Last 1 Encounters:  08/29/20 80 kg    BMI:  Body mass index is 25.31 kg/m.  Estimated Nutritional Needs:   Kcal:  2200-2400  Protein:  110-130 gm  Fluid:  2-2.2 L    Lucas Mallow, RD, LDN, CNSC Please refer to  Amion for contact information.

## 2020-08-30 NOTE — Progress Notes (Signed)
Patient still remains on High flow nasal cannula.  Offered BIPAP patient states fine like he is and wife agreed.  No distressed noted at this time.

## 2020-08-30 NOTE — Progress Notes (Signed)
Physical Therapy Treatment Patient Details Name: Tim Day MRN: [id number] DOB: 12/15/1950 Today's Date: 08/30/2020    History of Present Illness 69 year old with hx of recent COVID pneumonia diagnosis (9/7-9/13, discharge on 2L O2), BL COVID induced DVTs, DM2, GERD HTN presenting with worsening SOB. Per chart found to have left pneumothorax and worsening bilateral infiltrates. Intubated 9/19-9/20     PT Comments    Pt HR in 110s on entry, so focus of session to get to EoB and practice breathing. Pt agreeable, requiring modA for coming to EoB. Pt feeling starved of breath on coming to seated despite SaO2 >90%O2. Worked on decreasing RR from 50 bpm, and improving purse lip breathing. Pt able to reduce RR to 20 bpm with increased concentration however RR returns to 40 bpm when focus is lost. Educated on need to practice visualized purse lip breathing when he is laying in bed so he can draw on it when he gets anxious. Pt and wife in agreement. D/c plan remains appropriate. PT will continue to follow acutely.     Follow Up Recommendations  CIR;Supervision/Assistance - 24 hour     Equipment Recommendations  None recommended by PT       Precautions / Restrictions Precautions Precautions: None;Fall Precaution Comments: watch 02, watch HR    Mobility  Bed Mobility Overal bed mobility: Needs Assistance Bed Mobility: Supine to Sit;Sit to Supine;Rolling     Supine to sit: Mod assist;+2 for physical assistance Sit to supine: Mod assist;+2 for physical assistance   General bed mobility comments: pt able to manage LE off bed, requires modA for trunk to upright and scooting hips to EoB, requires mod A for returning LE to bed         Balance Overall balance assessment: Needs assistance Sitting-balance support: Feet supported;Single extremity supported;No upper extremity supported Sitting balance-Leahy Scale: Fair Sitting balance - Comments: able to sit without suppor                                     Cognition Arousal/Alertness: Awake/alert Behavior During Therapy: Anxious;WFL for tasks assessed/performed Overall Cognitive Status: Within Functional Limits for tasks assessed                                        Exercises Low Level/ICU Exercises Ankle Circles/Pumps: AAROM;AROM;15 reps (AROM plantarflex, AAROM dorsiflexion ) Other Exercises Other Exercises: visulized breathing for cadence Other Exercises: Flutter valve     General Comments General comments (skin integrity, edema, etc.): Pt on 25L HHFNC, 100% fiO2, able to maintain SaO2 >90%O2 through 8 minutes EoB, initially RR 55 worked on purse lip breathing with a slowed pattern able to bring RR to high 20s however when he stopped concentrating on his breath it returned to 40s HR 100-110s      Pertinent Vitals/Pain Pain Assessment: Faces Faces Pain Scale: Hurts a little bit Pain Location: back Pain Descriptors / Indicators: Discomfort;Aching Pain Intervention(s): Limited activity within patient's tolerance;Monitored during session;Repositioned           PT Goals (current goals can now be found in the care plan section) Acute Rehab PT Goals Patient Stated Goal: get better, get home when able PT Goal Formulation: With patient Time For Goal Achievement: 09/08/20 Potential to Achieve Goals: Good Progress towards PT goals: Progressing toward goals  Frequency    Min 3X/week      PT Plan Current plan remains appropriate       AM-PAC PT "6 Clicks" Mobility   Outcome Measure  Help needed turning from your back to your side while in a flat bed without using bedrails?: A Little Help needed moving from lying on your back to sitting on the side of a flat bed without using bedrails?: A Lot Help needed moving to and from a bed to a chair (including a wheelchair)?: Total Help needed standing up from a chair using your arms (e.g., wheelchair or bedside chair)?:  Total Help needed to walk in hospital room?: Total Help needed climbing 3-5 steps with a railing? : Total 6 Click Score: 9    End of Session Equipment Utilized During Treatment: Oxygen Activity Tolerance: Patient tolerated treatment well;Patient limited by fatigue Patient left: in bed;with family/visitor present;with call bell/phone within reach Nurse Communication: Mobility status PT Visit Diagnosis: Other abnormalities of gait and mobility (R26.89);Muscle weakness (generalized) (M62.81);Difficulty in walking, not elsewhere classified (R26.2)     Time: 7793-9030 PT Time Calculation (min) (ACUTE ONLY): 24 min  Charges:  $Therapeutic Exercise: 8-22 mins $Therapeutic Activity: 8-22 mins                     Aryel Edelen B. Migdalia Dk PT, DPT Acute Rehabilitation Services Pager [phone] Office [phone]    [place] 08/30/2020, 2:20 PM

## 2020-08-30 NOTE — Progress Notes (Signed)
  Speech Language Pathology Treatment: Dysphagia  Patient Details Name: Tim Day MRN: [id number] DOB: 06/29/1951 Today's Date: 08/30/2020 Time: 4356-8616 SLP Time Calculation (min) (ACUTE ONLY): 12 min  Assessment / Plan / Recommendation Clinical Impression  Pt was seen for dysphagia treatment with his wife present. A full liquid diet was most recently recommended by speech pathology on 08/28/20. However, pt's diet was subsequently advanced to regular texture solids and thin liquids on that date. Pt and his wife reported that the pt has been tolerating it well without dyspnea or signs of aspiration. Pt's respiratory status is improving and he no longer requires NRB. Pt tolerated ice chips, thin liquids via straw, mixed consistency boluses and regular texture solids without overt s/sx of aspiration. Mastication was [facility] and no significant oral residue was noted. Pt did report being slightly dyspneic from sitting up and RR was elevated. Pt requested that he be reclined and reported improvement. Pt and his wife were educated regarding the swallow mechanism and strategies to reduce aspiration risk due to dyspnea such as taking rest breaks. Both parties verbalized understanding. It is recommended that the current diet of regular texture solids with thin liquids be continued. Further skilled SLP services are not clinically indicated at this time.    HPI HPI: 69 year old with hx of recent COVID pneumonia diagnosis (9/7-9/13, discharge on 2L O2), BL COVID induced DVTs, DM2, GERD HTN presenting with worsening SOB. Per chart found to have left pneumothorax and worsening bilateral infiltrates. Intubated 9/19-9/20      SLP Plan  All goals met;Discharge SLP treatment due to (comment)       Recommendations  Diet recommendations: Regular;Thin liquid Liquids provided via: Cup;Straw Medication Administration: Crushed with puree Supervision: Full supervision/cueing for compensatory  strategies;Staff to assist with self feeding;Trained caregiver to feed patient Compensations: Slow rate;Small sips/bites;Other (Comment) (rest breaks if RR elevated/pt becomes dyspneic/increased WOB) Postural Changes and/or Swallow Maneuvers: Seated upright 90 degrees;Upright 30-60 min after meal                Oral Care Recommendations: Oral care BID Follow up Recommendations: Inpatient Rehab SLP Visit Diagnosis: Dysphagia, unspecified (R13.10) Plan: All goals met;Discharge SLP treatment due to (comment)       Naylani Bradner I. Hardin Negus, [place], [place] Office number [phone] Pager [place] 08/30/2020, 1:19 PM

## 2020-08-30 NOTE — Progress Notes (Signed)
PCCM Brief Note Chart reviewed 08/30/20  COVID-19 related ARDS with L ptx which required chest tube. Chest tube removed 08/29/20.  Follow up CXR 08/29/2020 at approx 2300 reviewed, no evidence of residual ptx following chest tube removal.  As per PCCM progress note 08/29/2020, PCCM signing off. Please re-engage if questions or if further assistance needed.     Eliseo Gum MSN, AGACNP-BC [place] [phone] 08/30/2020, 8:00 AM

## 2020-08-31 DIAGNOSIS — J9601 Acute respiratory failure with hypoxia: Secondary | ICD-10-CM | POA: Diagnosis not present

## 2020-08-31 DIAGNOSIS — U071 COVID-19: Secondary | ICD-10-CM | POA: Diagnosis not present

## 2020-08-31 LAB — CBC
HCT: 33.8 % — ABNORMAL LOW (ref 39.0–52.0)
Hemoglobin: 11 g/dL — ABNORMAL LOW (ref 13.0–17.0)
MCH: 29 pg (ref 26.0–34.0)
MCHC: 32.5 g/dL (ref 30.0–36.0)
MCV: 89.2 fL (ref 80.0–100.0)
Platelets: 198 10*3/uL (ref 150–400)
RBC: 3.79 MIL/uL — ABNORMAL LOW (ref 4.22–5.81)
RDW: 13.6 % (ref 11.5–15.5)
WBC: 9.2 10*3/uL (ref 4.0–10.5)
nRBC: 0.2 % (ref 0.0–0.2)

## 2020-08-31 LAB — BASIC METABOLIC PANEL
Anion gap: 9 (ref 5–15)
BUN: 25 mg/dL — ABNORMAL HIGH (ref 8–23)
CO2: 34 mmol/L — ABNORMAL HIGH (ref 22–32)
Calcium: 8.6 mg/dL — ABNORMAL LOW (ref 8.9–10.3)
Chloride: 88 mmol/L — ABNORMAL LOW (ref 98–111)
Creatinine, Ser: 0.7 mg/dL (ref 0.61–1.24)
GFR calc Af Amer: >60 mL/min (ref >60)
GFR calc non Af Amer: >60 mL/min (ref >60)
Glucose, Bld: 299 mg/dL — ABNORMAL HIGH (ref 70–99)
Potassium: 5.1 mmol/L (ref 3.5–5.1)
Sodium: 131 mmol/L — ABNORMAL LOW (ref 135–145)

## 2020-08-31 LAB — URINALYSIS, ROUTINE W REFLEX MICROSCOPIC
Bilirubin Urine: NEGATIVE
Glucose, UA: >=500 mg/dL — AB
Ketones, ur: NEGATIVE mg/dL
Nitrite: NEGATIVE
Protein, ur: NEGATIVE mg/dL
Specific Gravity, Urine: 1.022 (ref 1.005–1.030)
pH: 6 (ref 5.0–8.0)

## 2020-08-31 LAB — GLUCOSE, CAPILLARY
Glucose-Capillary: 241 mg/dL — ABNORMAL HIGH (ref 70–99)
Glucose-Capillary: 280 mg/dL — ABNORMAL HIGH (ref 70–99)
Glucose-Capillary: 357 mg/dL — ABNORMAL HIGH (ref 70–99)
Glucose-Capillary: 197 mg/dL — ABNORMAL HIGH (ref 70–99)

## 2020-08-31 MED ORDER — SULFAMETHOXAZOLE-TRIMETHOPRIM 800-160 MG PO TABS
1.0000 | ORAL_TABLET | Freq: Two times a day (BID) | ORAL | Status: DC
  Administered 2020-08-31 – 2020-09-06 (×12): 1 via ORAL
  Filled 2020-08-31 (×13): qty 1

## 2020-08-31 MED ORDER — INSULIN GLARGINE 100 UNIT/ML ~~LOC~~ SOLN
5.0000 [IU] | Freq: Once | SUBCUTANEOUS | Status: AC
  Administered 2020-08-31: 5 [IU] via SUBCUTANEOUS

## 2020-08-31 MED ORDER — INSULIN GLARGINE 100 UNIT/ML ~~LOC~~ SOLN
7.0000 [IU] | Freq: Once | SUBCUTANEOUS | Status: DC
  Filled 2020-08-31: qty 0.07

## 2020-08-31 MED ORDER — CHLORHEXIDINE GLUCONATE CLOTH 2 % EX PADS
6.0000 | MEDICATED_PAD | Freq: Every day | CUTANEOUS | Status: DC
  Administered 2020-08-31 – 2020-09-04 (×5): 6 via TOPICAL

## 2020-08-31 MED ORDER — INSULIN GLARGINE 100 UNIT/ML ~~LOC~~ SOLN
20.0000 [IU] | Freq: Two times a day (BID) | SUBCUTANEOUS | Status: DC
  Administered 2020-08-31 – 2020-09-03 (×6): 20 [IU] via SUBCUTANEOUS
  Filled 2020-08-31 (×7): qty 0.2

## 2020-08-31 NOTE — Progress Notes (Signed)
  Subjective: Urology consulted 9/19 for difficult foley placement.  73 French Foley placed via hypospadiac meatus on 9/19.  Foley was subsequently removed and he has had issues with incomplete bladder emptying.  Most recently bladder scan demonstrated over 500 mL.  Nursing unable to catheterize.  Pt reports lower abdominal and pelvic discomfort.  He states he has been able to dribble some urine but no real void today.  He states Foley catheter was removed approximately 5 days ago.  He states he has been voiding relatively well until over the past couple of days where he notes some sensation of incomplete bladder emptying, pelvic pressure.  Objective: Vital signs in last 24 hours: Temp:  [97.9 F (36.6 C)-100.4 F (38 C)] 99.3 F (37.4 C) (09/30 1231) Pulse Rate:  [102-115] 112 (09/30 1047) Resp:  [20-31] 24 (09/30 1047) BP: (108-143)/(68-88) 143/88 (09/30 1047) SpO2:  [95 %-100 %] 98 % (09/30 1047) FiO2 (%):  [80 %-90 %] 80 % (09/30 0800)  Intake/Output from previous day: 09/29 0701 - 09/30 0700 In: 840 [P.O.:840] Out: 3400 [Urine:3400] Intake/Output this shift: Total I/O In: 222 [P.O.:222] Out: 850 [Urine:850]  Physical Exam:  General: Alert and oriented CV: RRR Lungs: Clear Abdomen: Soft, ND, lower midline scar, no hernias GU: hypospadiac urethra with subglanular meatus Ext: NT, No erythema  Lab Results: Recent Labs    08/29/20 0229 08/30/20 0542 08/31/20 0104  HGB 12.6* 12.1* 11.0*  HCT 38.4* 37.0* 33.8*   BMET Recent Labs    08/30/20 1730 08/31/20 0104  NA 130* 131*  K 5.8* 5.1  CL 89* 88*  CO2 31 34*  GLUCOSE 365* 299*  BUN 22 25*  CREATININE 0.70 0.70  CALCIUM 8.7* 8.6*     Studies/Results: DG CHEST PORT 1 VIEW  Result Date: 08/29/2020 CLINICAL DATA:  Shortness of breath. EXAM: PORTABLE CHEST 1 VIEW COMPARISON:  August 29, 2020 (4:46 a.m.) FINDINGS: There is stable nasogastric tube positioning. The left-sided chest tube seen on the prior study  has been removed. Persistent moderate to marked severity areas of atelectasis and/or infiltrate are seen within the bilateral lung bases. There is no evidence of a pleural effusion or residual pneumothorax. The cardiac silhouette is mildly enlarged. Degenerative changes seen throughout the thoracic spine. IMPRESSION: 1. Interval left-sided chest tube removal without evidence of a residual pneumothorax. 2. Persistent moderate to marked severity bibasilar atelectasis and/or infiltrate. Electronically Signed   By: Virgina Norfolk M.D.   On: 08/29/2020 23:14    Assessment/Plan: 1. Acute urinary retention/incomplete bladder emptying: 14 French Foley catheter placed per hypospadiac urethra with return of 500 mL urine.  Inflated 10 cc sterile water in the balloon.  Leave Foley catheter to gravity for 3 to 5 days for urinary retention.  Continue Flomax.  Check urine culture. Void trial in 3 to 5 days.   LOS: 12 days   Janith Lima 08/31/2020, 2:05 PM [facility]  Pager: [phone]

## 2020-08-31 NOTE — Progress Notes (Signed)
Pt with oral temp 100.71F. PRN tylenol given. RN messaged Leory Plowman Chotiner,MD to notify. Received callback from provider, RN also made provider aware of UA collected at 1700 and also informed MD Pt endorsing  burning with urination prior to catheter placement early in the day. Received new order for Bactrim.     RN also notified Charge RN and Shanon Brow with RRT of patient MEWs and risk score increasing.

## 2020-08-31 NOTE — Progress Notes (Signed)
PROGRESS NOTE    Tim Day  OZH:[id number] DOB: [birth year] DOA: 08/19/2020 PCP: Celene Squibb, MD   Chief Complaint  Patient presents with  . Covid Positive  . 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 She has found to have worsening bilateral infiltrates chest tube placed at any pain ER and sent to [facility] for further management. Patient was managed in ICU, initially was intubated and since has been extubated and on Heated high flow nasal cannula facemask, has been off Covid unit and transferred to [facility] 9/25. B dependent on high flow nasal cannula, had left chest tube subsequently removed 9/28 by [facility] events 9/1 treated for COVID - [place] 9/3 - discharged on 6L [place] xxx 9/4 and 9/5 - decadron and remdesivir opd xxxx 9/7- readmitted with worsening hypoxemia and pneumomediastinum 9/8 -  noted to have bilateral DVT,  9/13 - d/c home 9/13 on O2,  xxx 9/18 - returned with pneumothorax -> 50L - 100% HFNC 9/18 [facility] to [facility], chest tube exchanged prior to transfer; chest tube occluded, required maniuplation. 9/19 second chest tube placed. Intubated. CVL placed.  9/20 weaning; appears comfortable on spontaneous breathing trial.  Still having bloody output from both chest tubes, heparin infusion placed on hold for 8 hours.  Extubated. Late in evening oxygen requirements trending up. sats 75% in spite of high flow. XRAY w/ large PTX.. chest tubes not functioning. Lateral chest tube removed. Obstructed w/ old clot. New 28 french chest tube placed.  9/21 still short of breath. airleak 1/7 on lateral tube w/ tidal req high flow and NRB Weaning oxygen again 9/22 9/23 - now in 44m ICU . Trx out of covid isolation.  Not on vent. Not on pressors. Very deconditioned. On HHFNC and FAce mask.    9/25 Tx to [facility] on HHFNC/nrb-with worsening hypoxia represents call, PCCM back on rounding team as consult Seen by critical care and planned for bedtime BiPAP and in the daytime on RRR far off and to hold tube feeds at the time of BiPAP. 9/28- left chest tube removed.  During the night patient has not been using BiPAP and feels comfortable on high flow nasal.  Repeat chest x-ray no evidence of residual pneumothorax, persistent moderate to marked severity bibasilar atelectasis and/or infiltrate  Subjective:  On high flow nasal cannula down to 15 L/min, FiO2 80% HR 100s Afebrile overnight Blood sugar remains up,potassium improved to 5.1 wbc 13.4> 12.2> 9.2k improved Wife at the bedside Patient having some urinary issues, external Foley in place, bladder scan showed 538 mL urine  Assessment & Plan:  Acute hypoxic respiratory failure due to COVID ARDS and left pneumothorax: Completed covid 19 pneumonia treatment including baricitinib 9/24. Remains on high flow nasal cannula and working on ambulation PT OT along with respiratory support bronchodilators with plan to wean down oxygen. Now on 15 L high flow nasal cannula. Bedtime and as needed BiPAP but has not been using bedtime BiPAP for several nights as he feels comfortable only on HFNC. Left chest tube off 9/28 PCCM signed off and chest x-ray- showed bibasilar moderate to marked infiltrates likely post Covid.  Left pneumothorax with chest tube clotting with blood, with very small right pneumothorax- pneumothorax resolved after placing new lateral 28 [place], , s/p removal of apical chest tube. 9/28- left lateral chest tube also removed.Repeat  chest x-ray 9/28 night  no evidence of residual pneumothorax, persistent moderate to marked severity bibasilar atelectasis and/or infiltrate.  Overall respiratory status is improving.  Urine retention bladder scan with 538 ml urine - will in and out cath x1 and check UA as he had had low-grade temperature this  morning.cont his flomax, monitor bladder scan q shift.  Bilateral lower extremity DVT 08/09/2021 with history of upper extremity DVT in 2009 2010:was on Eliquis at home - here iv heparin switched to lovenox 9/26. Will transition to Eliquis soon  Mild hyperkalemia: In the setting of hyperglycemia, status post Lokelma x2, potassium at 5.1, monitor closely continue [facility] and stop soon. Recent Labs  Lab 08/29/20 0229 08/29/20 1628 08/30/20 0542 08/30/20 1730 08/31/20 0104  K 5.5* 5.4* 5.2* 5.8* 5.1   Mild hyponatremia pseudohyponatremia from hyperglycemia. Hypophosphatemia: Resolved.  EKG changes on 9/24 and history of chest pain troponin 184>191:Seen by cardiology  no further plan Echo reassuring with normal EF no regional wall motion abnormalities  Benign essential HTN: Is controlled on. Home amlodipine and Benicar HCTZ is on hold.    DM2: Controlled hemoglobin A1c 7.2,on lantus 25 units q am and glipizide5 mg bid,metformin at home.blood sugar fluctuating as patient's oral intake is picking up, tube feed held 9/28. We will further increase Lantus to 20 units twice daily-give 5 units extra,NovoLog 10 units TID, and SSI. Recent Labs  Lab 08/30/20 0759 08/30/20 1116 08/30/20 1703 08/30/20 2049 08/31/20 0736  GLUCAP 186* 303* 319* 372* 241*   Lab Results  Component Value Date   HGBA1C 7.2 (H) 08/08/2020   PPI:RJJOACZYSA speech input.  On carb modified diet, has core track in place, follow-up w/ SLP and see if we can remove it today.Augment nutrition,appreciate dietitian input.  Pressure ulcer POA as below.  Severe protein calorie malnutrition as below, augment diet as above.    Patient is at risk of decompensation.Continue to monitor closely.  DVT prophylaxis: SCDs Start: 08/19/20 1842Heparin gtt Code Status:   Code Status: Full Code  Family Communication: plan of care discussed with patient and his wife at bedside.  Status is: Inpatient Remains inpatient appropriate  because:IV treatments appropriate due to intensity of illness or inability to take PO and ongoing management of acute hypoxic aspiratory failure needing high flow nasal cannula Monitor closely, at risk of decompensation.  Dispo: The patient is from: Home              Anticipated d/c is to: [facility].TOC looking into [facility], wife is interested in [facility] Rather than [facility] as some family working in the [employer].              Anticipated d/c date is: > 3 days              Patient currently is not medically stable to d/c. Nutrition: Diet Order            Diet Carb Modified Fluid consistency: Thin; Room service appropriate? Yes  Diet effective now                 Nutrition Problem: Severe Malnutrition Etiology: acute illness (COVID19) Signs/Symptoms: mild fat depletion, mild muscle depletion, moderate muscle depletion, severe muscle depletion, moderate fat depletion, percent weight loss (13.3% weight loss in less than 1 month) Percent weight loss: 13.3 % Interventions: Tube feeding Body mass index is 25.31 kg/m. Pressure Ulcer: Pressure Injury 08/19/20 Buttocks Bilateral Stage 2 -  Partial thickness loss of dermis presenting as a shallow open injury with  a red, pink wound bed without slough. (Active)  08/19/20 1853  Location: Buttocks  Location Orientation: Bilateral  Staging: Stage 2 -  Partial thickness loss of dermis presenting as a shallow open injury with a red, pink wound bed without slough.  Wound Description (Comments):   Present on Admission: Yes    Consultants:see note  Procedures:see note Microbiology:see note Blood Culture    Component Value Date/Time   SDES BLOOD LEFT HAND 08/19/2020 1953   SDES BLOOD LEFT ARM 08/19/2020 1953   SPECREQUEST  08/19/2020 1953    BOTTLES DRAWN AEROBIC AND ANAEROBIC Blood Culture adequate volume   SPECREQUEST  08/19/2020 1953    BOTTLES DRAWN AEROBIC ONLY Blood Culture results may not be optimal due to an inadequate volume of blood  received in culture bottles   CULT  08/19/2020 1953    NO GROWTH 5 DAYS Performed at [facility], [place] [address] [place], [place] [address]    CULT  08/19/2020 1953    NO GROWTH 5 DAYS Performed at [facility] [address], [place], [place] [address]    REPTSTATUS 08/24/2020 FINAL 08/19/2020 1953   REPTSTATUS 08/24/2020 FINAL 08/19/2020 1953    Other culture-see note  Medications: Scheduled Meds: . chlorhexidine  15 mL Mouth Rinse BID  . docusate  100 mg Oral BID  . enoxaparin (LOVENOX) injection  80 mg Subcutaneous BID  . feeding supplement (ENSURE ENLIVE)  237 mL Oral QID  . insulin aspart  0-15 Units Subcutaneous TID WC  . insulin aspart  0-5 Units Subcutaneous QHS  . insulin aspart  10 Units Subcutaneous TID WC  . insulin glargine  15 Units Subcutaneous BID  . linagliptin  5 mg Oral Daily  . mouth rinse  15 mL Mouth Rinse q12n4p  . pantoprazole sodium  40 mg Per Tube QHS  . polyethylene glycol  17 g Oral BID  . sodium zirconium cyclosilicate  10 g Oral Daily  . tamsulosin  0.4 mg Oral Daily   Continuous Infusions: . sodium chloride      Antimicrobials: Anti-infectives (From admission, onward)   Start     Dose/Rate Route Frequency Ordered Stop   08/19/20 2000  piperacillin-tazobactam (ZOSYN) IVPB 3.375 g        3.375 g 12.5 mL/hr over 240 Minutes Intravenous Every 8 hours 08/19/20 1850 08/23/20 2359   08/19/20 1845  piperacillin-tazobactam (ZOSYN) IVPB 3.375 g  Status:  Discontinued        3.375 g 100 mL/hr over 30 Minutes Intravenous  Once 08/19/20 1838 08/19/20 1850   08/19/20 0900  vancomycin (VANCOREADY) IVPB 1500 mg/300 mL        1,500 mg 150 mL/hr over 120 Minutes Intravenous  Once 08/19/20 0831 08/19/20 1303   08/19/20 0845  piperacillin-tazobactam (ZOSYN) IVPB 3.375 g        3.375 g 100 mL/hr over 30 Minutes Intravenous  Once 08/19/20 0831 08/19/20 1103     Objective: Vitals: Today's Vitals   08/31/20 0436 08/31/20 0728 08/31/20 0737  08/31/20 0800  BP: 109/73  114/77 114/77  Pulse: (!) 107 (!) 110 (!) 109 (!) 108  Resp: 20 (!) 26 (!) 24 (!) 25  Temp: 98.2 F (36.8 C)  99.8 F (37.7 C) 98.8 F (37.1 C)  TempSrc: Axillary  Oral Oral  SpO2: 100% 100% 97% 98%  Weight:      Height:      PainSc:    0-No pain    Intake/Output Summary (Last  24 hours) at 08/31/2020 0844 Last data filed at 08/31/2020 0438 Gross per 24 hour  Intake 840 ml  Output 3400 ml  Net -2560 ml   Filed Weights   08/25/20 0500 08/27/20 0536 08/29/20 0550  Weight: 80.5 kg 80 kg 80 kg   Weight change:   Intake/Output from previous day: 09/29 0701 - 09/30 0700 In: 840 [P.O.:840] Out: 3400 [Urine:3400] Intake/Output this shift: No intake/output data recorded.  Examination: General exam: AAO,weak, frail, NAD, weak appearing.cortrak+ HEENT:Oral mucosa moist, Ear/Nose WNL grossly, dentition normal. Respiratory system: bilaterally clear,no wheezing or crackles,no use of accessory muscle Cardiovascular system: S1 & S2 +, No JVD. Gastrointestinal system: Abdomen soft, NT,ND, BS+.  Bladder fullness with some Foley in place. Nervous System:Alert, awake, moving extremities and grossly nonfocal Extremities: No edema, distal peripheral pulses palpable.  Skin: No rashes,no icterus. MSK: Normal muscle bulk,tone, power  Data Reviewed: I have personally reviewed following labs and imaging studies CBC: Recent Labs  Lab 08/27/20 0346 08/28/20 0230 08/29/20 0229 08/30/20 0542 08/31/20 0104  WBC 13.1* 14.6* 13.3* 12.2* 9.2  HGB 12.0* 12.7* 12.6* 12.1* 11.0*  HCT 36.0* 38.2* 38.4* 37.0* 33.8*  MCV 86.5 85.8 87.9 90.7 89.2  PLT 175 244 243 205 202   Basic Metabolic Panel: Recent Labs  Lab 08/25/20 0533 08/25/20 0533 08/25/20 1206 08/25/20 1206 08/25/20 1900 08/26/20 0744 08/26/20 0744 08/26/20 1445 08/27/20 0346 08/27/20 0346 08/28/20 0230 08/28/20 0230 08/29/20 0229 08/29/20 1628 08/30/20 0542 08/30/20 1730 08/31/20 0104  NA 134*    < >  --   --   --  134*   < >  --  129*   < > 131*   < > 131* 132* 134* 130* 131*  K 4.8   < >  --   --   --  5.0   < >  --  4.9   < > 5.2*   < > 5.5* 5.4* 5.2* 5.8* 5.1  CL 93*   < >  --   --   --  94*   < >  --  94*   < > 94*   < > 92* 92* 90* 89* 88*  CO2 33*   < >  --   --   --  31   < >  --  29   < > 29   < > 30 30 36* 31 34*  GLUCOSE 80   < >  --   --   --  186*   < >  --  131*   < > 171*   < > 215* 276* 185* 365* 299*  BUN 31*   < >  --   --   --  22   < >  --  16   < > 16   < > 19 23 19 22  25*  CREATININE 0.65   < >  --   --   --  0.76   < >  --  0.68   < > 0.68   < > 0.67 0.84 0.67 0.70 0.70  CALCIUM 8.2*   < >  --   --   --  8.4*   < >  --  8.3*   < > 8.8*   < > 8.8* 8.9 8.9 8.7* 8.6*  MG 2.4  --  2.1  --  2.1 2.1  --  2.0  --   --   --   --   --   --   --   --   --  PHOS 2.7   < > 1.9*   < > 2.5 1.8*  --  2.4* 3.5  --  3.3  --   --   --   --   --   --    < > = values in this interval not displayed.   GFR: Estimated Creatinine Clearance: 92.5 mL/min (by C-G formula based on SCr of 0.7 mg/dL). Liver Function Tests: Recent Labs  Lab 08/25/20 0533 08/26/20 0744 08/27/20 0346 08/28/20 0230 08/29/20 0229  AST 25 30 26 27 24   ALT 68* 57* 48* 51* 46*  ALKPHOS 58 65 62 67 66  BILITOT 0.7 0.9 0.4 0.4 0.5  PROT 5.0* 5.3* 5.0* 5.4* 5.4*  ALBUMIN 2.0* 2.2* 2.1* 2.2* 2.1*   No results for input(s): LIPASE, AMYLASE in the last 168 hours. No results for input(s): AMMONIA in the last 168 hours. Coagulation Profile: No results for input(s): INR, PROTIME in the last 168 hours. Cardiac Enzymes: No results for input(s): CKTOTAL, CKMB, CKMBINDEX, TROPONINI in the last 168 hours. BNP (last 3 results) No results for input(s): PROBNP in the last 8760 hours. HbA1C: No results for input(s): HGBA1C in the last 72 hours. CBG: Recent Labs  Lab 08/30/20 0759 08/30/20 1116 08/30/20 1703 08/30/20 2049 08/31/20 0736  GLUCAP 186* 303* 319* 372* 241*   Lipid Profile: No results for  input(s): CHOL, HDL, LDLCALC, TRIG, CHOLHDL, LDLDIRECT in the last 72 hours. Thyroid Function Tests: No results for input(s): TSH, T4TOTAL, FREET4, T3FREE, THYROIDAB in the last 72 hours. Anemia Panel: No results for input(s): VITAMINB12, FOLATE, FERRITIN, TIBC, IRON, RETICCTPCT in the last 72 hours. Sepsis Labs: Recent Labs  Lab 08/25/20 0533  LATICACIDVEN 0.7    No results found for this or any previous visit (from the past 240 hour(s)).   Radiology Studies: DG CHEST PORT 1 VIEW  Result Date: 08/29/2020 CLINICAL DATA:  Shortness of breath. EXAM: PORTABLE CHEST 1 VIEW COMPARISON:  August 29, 2020 (4:46 a.m.) FINDINGS: There is stable nasogastric tube positioning. The left-sided chest tube seen on the prior study has been removed. Persistent moderate to marked severity areas of atelectasis and/or infiltrate are seen within the bilateral lung bases. There is no evidence of a pleural effusion or residual pneumothorax. The cardiac silhouette is mildly enlarged. Degenerative changes seen throughout the thoracic spine. IMPRESSION: 1. Interval left-sided chest tube removal without evidence of a residual pneumothorax. 2. Persistent moderate to marked severity bibasilar atelectasis and/or infiltrate. Electronically Signed   By: Virgina Norfolk M.D.   On: 08/29/2020 23:14     LOS: 12 days   Antonieta Pert, MD [facility]  08/31/2020, 8:44 AM

## 2020-08-31 NOTE — Progress Notes (Signed)
Verbal order received from Antonieta Pert, MD to remove cortrak. Also confirmed with speech therapy and dietitian.

## 2020-08-31 NOTE — Progress Notes (Signed)
Attempted to In and Out cath x2 with Colletta Maryland, RN and Janett Billow, RN. Unsuccessful. Antonieta Pert, MD notified. May need urology consult.

## 2020-08-31 NOTE — Progress Notes (Signed)
Pt oral temp 100.3 at 1015. MD notified. Tylenol given. Will continue to monitor.

## 2020-08-31 NOTE — Progress Notes (Signed)
[place] for enoxaparin Indication: VTE treatment  Allergies  Allergen Reactions  . Eggs Or Egg-Derived Products Hives    Patient Measurements: Height: 5\' 10"  (177.8 cm) Weight: 80 kg (176 lb 6.4 oz) IBW/kg (Calculated) : 73 kg  Vital Signs: Temp: 98.8 F (37.1 C) (09/30 0800) Temp Source: Oral (09/30 0800) BP: 114/77 (09/30 0800) Pulse Rate: 108 (09/30 0800)  Labs: Recent Labs    08/29/20 0229 08/29/20 1628 08/30/20 0542 08/30/20 1730 08/31/20 0104  HGB 12.6*  --  12.1*  --  11.0*  HCT 38.4*  --  37.0*  --  33.8*  PLT 243  --  205  --  198  CREATININE 0.67   < > 0.67 0.70 0.70   < > = values in this interval not displayed.    Estimated Creatinine Clearance: 92.5 mL/min (by C-G formula based on SCr of 0.7 mg/dL).   Medical History: Past Medical History:  Diagnosis Date  . Acute respiratory disease   . Atypical mole 12/30/2012   severe left post shoulder tx exc  . Candidiasis of urogenital sites   . Diabetes mellitus   . Diffuse myofascial pain syndrome   . Esophageal reflux   . Hypertension   . Impacted cerumen of right ear   . Melanoma ([facility]) 06/14/2011   left ear mohs  . Mixed hyperlipidemia   . MM (malignant melanoma of skin) ([place]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  . Primary insomnia   . Seborrheic dermatitis, unspecified   . 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 Thrombocytopenia, unspecified (HCC)     Medications:  Scheduled:  . chlorhexidine  15 mL Mouth Rinse BID  . docusate  100 mg Oral BID  . enoxaparin (LOVENOX) injection  80 mg Subcutaneous BID  . feeding supplement (ENSURE ENLIVE)  237 mL Oral QID  . insulin aspart  0-15 Units Subcutaneous TID WC  . insulin aspart  0-5 Units Subcutaneous QHS  . insulin aspart  10 Units Subcutaneous TID WC  . insulin glargine  20 Units Subcutaneous BID  . insulin glargine  7 Units Subcutaneous Once  . linagliptin  5 mg  Oral Daily  . mouth rinse  15 mL Mouth Rinse q12n4p  . pantoprazole sodium  40 mg Per Tube QHS  . polyethylene glycol  17 g Oral BID  . sodium zirconium cyclosilicate  10 g Oral Daily  . tamsulosin  0.4 mg Oral Daily   Infusions:  . sodium chloride     PRN: acetaminophen, albuterol, docusate, Gerhardt's butt cream, ondansetron **OR** ondansetron (ZOFRAN) IV, phenol, pneumococcal 23 valent vaccine, polyethylene glycol, sodium chloride, Thrombi-Pad, zolpidem   Anti-infectives (From admission, onward)   Start     Dose/Rate Route Frequency Ordered Stop   08/19/20 2000  piperacillin-tazobactam (ZOSYN) IVPB 3.375 g        3.375 g 12.5 mL/hr over 240 Minutes Intravenous Every 8 hours 08/19/20 1850 08/23/20 2359   08/19/20 1845  piperacillin-tazobactam (ZOSYN) IVPB 3.375 g  Status:  Discontinued        3.375 g 100 mL/hr over 30 Minutes Intravenous  Once 08/19/20 1838 08/19/20 1850   08/19/20 0900  vancomycin (VANCOREADY) IVPB 1500 mg/300 mL        1,500 mg 150 mL/hr over 120 Minutes Intravenous  Once 08/19/20 0831 08/19/20 1303   08/19/20 0845  piperacillin-tazobactam (ZOSYN) IVPB 3.375 g        3.375 g  100 mL/hr over 30 Minutes Intravenous  Once 08/19/20 0831 08/19/20 1103      Assessment: 69 yo male with a history of T2DM, HTN, HLD, GERD, and recent hospitalization 9/1-9/3 for Covid-19 pneumonia (discharged on 6LPM O2; completed remdesivir infusions outpatient on 9/5). Patient admitted again from 9/7-9/13 and found to have acute bilateral DVTs with concern for PE but no definitive PE was noted on imaging. Pt was initiated on apixaban and continued treatment as an outpatient for DVT. The patient presented again on 9/18 with increasing SOB. Patients last dose of apixaban was 9/17 at 1900.   The patient was previously on heparin, but after discussion with the provider the patient will be transitioned to enoxaparin. Has been on 80 mg (1 mg/kg) every 12 hours since 9/26. Hgb stable at 11, plt  198. No s/sx of bleeding. Scr remains stable at 0.7 (CrCl ~92 mL/min).   Goal of Therapy:  Anti-Xa level 0.6-1 units/ml 4hrs after LMWH dose given Monitor platelets by anticoagulation protocol: Yes   Plan:  Continue enoxaparin 80 mg SubQ q12h Monitor daily CBC Obtain anti-Xa levels when clinically indicated Monitor for signs and symptoms of bleeding  Antonietta Jewel, PharmD, [place] Pharmacist  Phone: [phone] 08/31/2020 10:19 AM  Please check AMION for all [place] phone numbers After 10:00 PM, call [place] [phone]

## 2020-09-01 DIAGNOSIS — U071 COVID-19: Secondary | ICD-10-CM | POA: Diagnosis not present

## 2020-09-01 DIAGNOSIS — J9601 Acute respiratory failure with hypoxia: Secondary | ICD-10-CM | POA: Diagnosis not present

## 2020-09-01 LAB — CBC
HCT: 34.9 % — ABNORMAL LOW (ref 39.0–52.0)
Hemoglobin: 11.5 g/dL — ABNORMAL LOW (ref 13.0–17.0)
MCH: 29.6 pg (ref 26.0–34.0)
MCHC: 33 g/dL (ref 30.0–36.0)
MCV: 89.9 fL (ref 80.0–100.0)
Platelets: 183 10*3/uL (ref 150–400)
RBC: 3.88 MIL/uL — ABNORMAL LOW (ref 4.22–5.81)
RDW: 14 % (ref 11.5–15.5)
WBC: 6.8 10*3/uL (ref 4.0–10.5)
nRBC: 0.4 % — ABNORMAL HIGH (ref 0.0–0.2)

## 2020-09-01 LAB — BASIC METABOLIC PANEL
Anion gap: 12 (ref 5–15)
BUN: 20 mg/dL (ref 8–23)
CO2: 32 mmol/L (ref 22–32)
Calcium: 8.4 mg/dL — ABNORMAL LOW (ref 8.9–10.3)
Chloride: 88 mmol/L — ABNORMAL LOW (ref 98–111)
Creatinine, Ser: 0.66 mg/dL (ref 0.61–1.24)
GFR calc Af Amer: >60 mL/min (ref >60)
GFR calc non Af Amer: >60 mL/min (ref >60)
Glucose, Bld: 323 mg/dL — ABNORMAL HIGH (ref 70–99)
Potassium: 4.9 mmol/L (ref 3.5–5.1)
Sodium: 132 mmol/L — ABNORMAL LOW (ref 135–145)

## 2020-09-01 LAB — GLUCOSE, CAPILLARY
Glucose-Capillary: 318 mg/dL — ABNORMAL HIGH (ref 70–99)
Glucose-Capillary: 272 mg/dL — ABNORMAL HIGH (ref 70–99)
Glucose-Capillary: 269 mg/dL — ABNORMAL HIGH (ref 70–99)
Glucose-Capillary: 406 mg/dL — ABNORMAL HIGH (ref 70–99)

## 2020-09-01 MED ORDER — APIXABAN 5 MG PO TABS
5.0000 mg | ORAL_TABLET | Freq: Two times a day (BID) | ORAL | Status: DC
  Administered 2020-09-01 – 2020-09-28 (×53): 5 mg via ORAL
  Filled 2020-09-01 (×54): qty 1

## 2020-09-01 NOTE — Progress Notes (Signed)
Fara Boros MD in regard to pt. K+ level 4.9. OK to hold Lokelma.

## 2020-09-01 NOTE — Progress Notes (Signed)
RT NOTES: Removed patient from bipap and placed on HHFNC 15L/60%. Tolerating well.

## 2020-09-01 NOTE — Progress Notes (Signed)
[place] for enoxaparin Indication: VTE treatment  Allergies  Allergen Reactions  . Eggs Or Egg-Derived Products Hives    Patient Measurements: Height: 5\' 10"  (177.8 cm) Weight: 80 kg (176 lb 6.4 oz) IBW/kg (Calculated) : 73 kg  Vital Signs: Temp: 98.8 F (37.1 C) (10/01 1103) Temp Source: Oral (10/01 1103) BP: 118/71 (10/01 1120) Pulse Rate: 113 (10/01 1151)  Labs: Recent Labs    08/30/20 0542 08/30/20 0542 08/30/20 1730 08/31/20 0104 09/01/20 0512  HGB 12.1*   < >  --  11.0* 11.5*  HCT 37.0*  --   --  33.8* 34.9*  PLT 205  --   --  198 183  CREATININE 0.67   < > 0.70 0.70 0.66   < > = values in this interval not displayed.    Estimated Creatinine Clearance: 92.5 mL/min (by C-G formula based on SCr of 0.66 mg/dL).   Medical History: Past Medical History:  Diagnosis Date  . Acute respiratory disease   . Atypical mole 12/30/2012   severe left post shoulder tx exc  . Candidiasis of urogenital sites   . Diabetes mellitus   . Diffuse myofascial pain syndrome   . Esophageal reflux   . Hypertension   . Impacted cerumen of right ear   . Melanoma ([place]) 06/14/2011   left ear mohs  . Mixed hyperlipidemia   . MM (malignant melanoma of skin) ([place]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  . Primary insomnia   . Seborrheic dermatitis, unspecified   . Squamous cell carcinoma of skin 06/14/2011   left forearm medial cx3 73fu  . Thrombocytopenia, unspecified (HCC)     Medications:  Scheduled:  . chlorhexidine  15 mL Mouth Rinse BID  . Chlorhexidine Gluconate Cloth  6 each Topical Daily  . docusate  100 mg Oral BID  . enoxaparin (LOVENOX) injection  80 mg Subcutaneous BID  . feeding supplement (ENSURE ENLIVE)  237 mL Oral QID  . insulin aspart  0-15 Units Subcutaneous TID WC  . insulin aspart  0-5 Units Subcutaneous QHS  . insulin aspart  10 Units Subcutaneous TID WC  . insulin glargine  20 Units Subcutaneous BID  .  linagliptin  5 mg Oral Daily  . mouth rinse  15 mL Mouth Rinse q12n4p  . pantoprazole sodium  40 mg Per Tube QHS  . polyethylene glycol  17 g Oral BID  . sodium zirconium cyclosilicate  10 g Oral Daily  . sulfamethoxazole-trimethoprim  1 tablet Oral Q12H  . tamsulosin  0.4 mg Oral Daily   Infusions:  . sodium chloride     PRN: acetaminophen, albuterol, docusate, Gerhardt's butt cream, ondansetron **OR** ondansetron (ZOFRAN) IV, phenol, pneumococcal 23 valent vaccine, polyethylene glycol, sodium chloride, Thrombi-Pad, zolpidem   Anti-infectives (From admission, onward)   Start     Dose/Rate Route Frequency Ordered Stop   08/31/20 2230  sulfamethoxazole-trimethoprim (BACTRIM DS) 800-160 MG per tablet 1 tablet        1 tablet Oral Every 12 hours 08/31/20 2201     08/19/20 2000  piperacillin-tazobactam (ZOSYN) IVPB 3.375 g        3.375 g 12.5 mL/hr over 240 Minutes Intravenous Every 8 hours 08/19/20 1850 08/23/20 2359   08/19/20 1845  piperacillin-tazobactam (ZOSYN) IVPB 3.375 g  Status:  Discontinued        3.375 g 100 mL/hr over 30 Minutes Intravenous  Once 08/19/20 1838 08/19/20 1850   08/19/20 0900  vancomycin (VANCOREADY) IVPB 1500 mg/300  mL        1,500 mg 150 mL/hr over 120 Minutes Intravenous  Once 08/19/20 0831 08/19/20 1303   08/19/20 0845  piperacillin-tazobactam (ZOSYN) IVPB 3.375 g        3.375 g 100 mL/hr over 30 Minutes Intravenous  Once 08/19/20 0831 08/19/20 1103      Assessment: 69 yo male with a history of T2DM, HTN, HLD, GERD, and recent hospitalization 9/1-9/3 for Covid-19 pneumonia (discharged on 6LPM O2; completed remdesivir infusions outpatient on 9/5). Patient admitted again from 9/7-9/13 and found to have acute bilateral DVTs with concern for PE but no definitive PE was noted on imaging. Pt was initiated on apixaban and continued treatment as an outpatient for DVT. The patient presented again on 9/18 with increasing SOB. Patients last dose of apixaban was 9/17  at 1900.   Pt has been maintained on enoxaparin this admission. Per MD, will transition back to home apixaban. Enoxaparin last given this morning, so will begin apixaban this evening.  Goal of Therapy:  Anti-Xa level 0.6-1 units/ml 4hrs after LMWH dose given Monitor platelets by anticoagulation protocol: Yes   Plan:  Stop enoxaparin Apixaban 5mg  BID tonight Pharmacy will sign off   Arrie Senate, PharmD, BCPS Clinical Pharmacist [phone] Please check AMION for all [facility] numbers 09/01/2020

## 2020-09-01 NOTE — Progress Notes (Signed)
PROGRESS NOTE    Tim Day  BHA:[id number] DOB: 10/15/1951 DOA: 08/19/2020 PCP: Celene Squibb, MD   Chief Complaint  Patient presents with  . Covid Positive  . 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 She has found to have worsening bilateral infiltrates chest tube placed at any pain ER and sent to [facility] for further management. Patient was managed in ICU, initially was intubated and since has been extubated and on Heated high flow nasal cannula facemask, has been off Covid unit and transferred to [facility] 9/25. He remains dependent on high flow nasal cannula, had left chest tube subsequently removed 9/28 by PCCM Had urine retention 9/30 and foley placed.  Significant Hospital events 9/1 treated for COVID - [place] 9/3 - discharged on 6L [place] xxx 9/4 and 9/5 - decadron and remdesivir opd xxxx 9/7- readmitted with worsening hypoxemia and pneumomediastinum 9/8 -  noted to have bilateral DVT,  9/13 - d/c home 9/13 on O2,  xxx 9/18 - returned with pneumothorax -> 50L - 100% HFNC 9/18 [facility] to [facility], chest tube exchanged prior to transfer; chest tube occluded, required maniuplation. 9/19 second chest tube placed. Intubated. CVL placed.  9/20 weaning; appears comfortable on spontaneous breathing trial.  Still having bloody output from both chest tubes, heparin infusion placed on hold for 8 hours.  Extubated. Late in evening oxygen requirements trending up. sats 75% in spite of high flow. XRAY w/ large PTX.. chest tubes not functioning. Lateral chest tube removed. Obstructed w/ old clot. New 28 french chest tube placed.  9/21 still short of breath. airleak 1/7 on lateral tube w/ tidal req high flow and NRB Weaning oxygen again 9/22 9/23 - now in 59m ICU . Trx out of covid isolation.  Not on vent. Not on  pressors. Very deconditioned. On HHFNC and FAce mask.  9/25 Tx to [facility] on HHFNC/nrb-with worsening hypoxia represents call, PCCM back on rounding team as consult Seen by critical care and planned for bedtime BiPAP and in the daytime on RRR far off and to hold tube feeds at the time of BiPAP. 9/28- left chest tube removed.  During the night patient has not been using BiPAP and feels comfortable on high flow nasal.  Repeat chest x-ray no evidence of residual pneumothorax, persistent moderate to marked severity bibasilar atelectasis and/or infiltrate 9/30: urine retention- foley placed by Dr Abner Greenspan.  Subjective: Remains on high flow nasal cannula 15 L, fio2 60% Reports he feels much better today, off core track, had Foley placed yesterday. T-max 100.5 yesterday,no recurrence of fever. k at 4.9 wbc 13.4> 12.2> 9.2k> 6.8 Wife at the bedside  Assessment & Plan:  Acute hypoxic respiratory failure due to COVID ARDS and left pneumothorax: Completed covid 19 pneumonia treatment including baricitinib 9/24.  Remains on HFNC, continue to wean down oxygen, remains on high flow nasal cannula and working on ambulation PT OT along with respiratory support bronchodilators with plan to wean down oxygen. Now on 15 L high flow nasal cannula. Bedtime and as needed BiPAP but has not been using bedtime BiPAP for several nights as he feels comfortable only on HFNC. Left chest tube off 9/28 PCCM signed off and chest x-ray- showed bibasilar moderate to marked infiltrates likely post Covid.  Left pneumothorax with chest tube clotting with blood, with very small right pneumothorax- pneumothorax resolved after placing  new lateral 82 [place], , s/p removal of apical chest tube. 9/28- left lateral chest tube also removed.Repeat chest x-ray 9/28 night  no evidence of residual pneumothorax, persistent moderate to marked severity bibasilar atelectasis and/or infiltrate.  Continue to wean down oxygen as tolerated.   Urine retention  bladder scan with 538 ml urine nursing unable to insert Foley catheter multiple times Dr. Raliegh Ip was kind enough to come in and insert a Foley catheter and recommend voiding trial in 3 to 5 days, follow-up urine culture, UA grossly abnormal. Holding off antibiotics for now unless spikes fever or has worsening leukocytosis.  B/L lower extremity DVT 08/09/2021 with history of upper extremity DVT in 2009 2010:was on Eliquis at home - here iv heparin switched to lovenox 9/26.  Will transition to Eliquis, consulted pharmacy  Hyperkalemia: In the setting of hyperglycemia, status post Lokelma x3, potassium at 4.9 hold off on further Lokelma. Recent Labs  Lab 08/29/20 1628 08/30/20 0542 08/30/20 1730 08/31/20 0104 09/01/20 0512  K 5.4* 5.2* 5.8* 5.1 4.9   Mild hyponatremia; sodium 132 monitor. Likely pseudohyponatremia from hyperglycemia.  Hypophosphatemia: Resolved.  EKG changes on 9/24 and history of chest pain troponin 184>191:Seen by cardiology  no further plan Echo reassuring with normal EF no regional wall motion abnormalities  Benign essential HTN: BP is well controlled.  Cont home amlodipine and Benicar HCTZ is on hold.    DM2: Controlled hemoglobin A1c 7.2,on lantus 25 units q am and glipizide 5 mg bid,metformin at home.blood sugar fluctuating as patient's oral intake is picking up, core track has been discontinued.  Lantus increased to 20 units twice daily and NovoLog 10 units 3 times daily along with SSI.  Continue to monitor and adjust insulin.  Recent Labs  Lab 08/31/20 1216 08/31/20 1749 08/31/20 2127 09/01/20 0737 09/01/20 1102  GLUCAP 280* 357* 197* 318* 272*   Lab Results  Component Value Date   HGBA1C 7.2 (H) 08/08/2020   KNL:[id number] speech input. On carb modified diet, off core track 9/30.he is on a carb modified diet.  Continue to augment nutrition per dietitian.    Pressure ulcer POA as below.  Severe protein calorie malnutrition as below, augment diet as above.     Patient is at risk of decompensation.Continue to monitor closely.  DVT prophylaxis: SCDs Start: 08/19/20 1842Heparin gtt Code Status:   Code Status: Full Code  Family Communication: plan of care discussed with patient and his wife at bedside.  Status is: Inpatient Remains inpatient appropriate because:IV treatments appropriate due to intensity of illness or inability to take PO and ongoing management of acute hypoxic aspiratory failure needing high flow nasal cannula Monitor closely, at risk of decompensation.  Dispo: The patient is from: Home              Anticipated d/c is to: CIR/LTACH.TOC looking into [facility], wife is interested in [facility]- Rather than [facility] as some family working in the [employer].              Anticipated d/c date is: > 3 days              Patient currently is not medically stable to d/c. Nutrition: Diet Order            Diet Carb Modified Fluid consistency: Thin; Room service appropriate? Yes  Diet effective now                 Nutrition Problem: Severe Malnutrition Etiology: acute illness (COVID19) Signs/Symptoms: mild fat depletion,  mild muscle depletion, moderate muscle depletion, severe muscle depletion, moderate fat depletion, percent weight loss (13.3% weight loss in less than 1 month) Percent weight loss: 13.3 % Interventions: Tube feeding Body mass index is 25.31 kg/m. Pressure Ulcer: Pressure Injury 08/19/20 Buttocks Bilateral Stage 2 -  Partial thickness loss of dermis presenting as a shallow open injury with a red, pink wound bed without slough. (Active)  08/19/20 1853  Location: Buttocks  Location Orientation: Bilateral  Staging: Stage 2 -  Partial thickness loss of dermis presenting as a shallow open injury with a red, pink wound bed without slough.  Wound Description (Comments):   Present on Admission: Yes    Consultants:see note  Procedures:see note Microbiology:see note Blood Culture    Component Value Date/Time   SDES BLOOD LEFT  HAND 08/19/2020 1953   SDES BLOOD LEFT ARM 08/19/2020 1953   SPECREQUEST  08/19/2020 1953    BOTTLES DRAWN AEROBIC AND ANAEROBIC Blood Culture adequate volume   SPECREQUEST  08/19/2020 1953    BOTTLES DRAWN AEROBIC ONLY Blood Culture results may not be optimal due to an inadequate volume of blood received in culture bottles   CULT  08/19/2020 1953    NO GROWTH 5 DAYS Performed at [facility], [place] [address], [place], [place] [address]    CULT  08/19/2020 1953    NO GROWTH 5 DAYS Performed at [facility] [address] [place], [place] [address]    REPTSTATUS 08/24/2020 FINAL 08/19/2020 1953   REPTSTATUS 08/24/2020 FINAL 08/19/2020 1953    Other culture-see note  Medications: Scheduled Meds: . chlorhexidine  15 mL Mouth Rinse BID  . Chlorhexidine Gluconate Cloth  6 each Topical Daily  . docusate  100 mg Oral BID  . enoxaparin (LOVENOX) injection  80 mg Subcutaneous BID  . feeding supplement (ENSURE ENLIVE)  237 mL Oral QID  . insulin aspart  0-15 Units Subcutaneous TID WC  . insulin aspart  0-5 Units Subcutaneous QHS  . insulin aspart  10 Units Subcutaneous TID WC  . insulin glargine  20 Units Subcutaneous BID  . linagliptin  5 mg Oral Daily  . mouth rinse  15 mL Mouth Rinse q12n4p  . pantoprazole sodium  40 mg Per Tube QHS  . polyethylene glycol  17 g Oral BID  . sodium zirconium cyclosilicate  10 g Oral Daily  . sulfamethoxazole-trimethoprim  1 tablet Oral Q12H  . tamsulosin  0.4 mg Oral Daily   Continuous Infusions: . sodium chloride      Antimicrobials: Anti-infectives (From admission, onward)   Start     Dose/Rate Route Frequency Ordered Stop   08/31/20 2230  sulfamethoxazole-trimethoprim (BACTRIM DS) 800-160 MG per tablet 1 tablet        1 tablet Oral Every 12 hours 08/31/20 2201     08/19/20 2000  piperacillin-tazobactam (ZOSYN) IVPB 3.375 g        3.375 g 12.5 mL/hr over 240 Minutes Intravenous Every 8 hours 08/19/20 1850 08/23/20 2359    08/19/20 1845  piperacillin-tazobactam (ZOSYN) IVPB 3.375 g  Status:  Discontinued        3.375 g 100 mL/hr over 30 Minutes Intravenous  Once 08/19/20 1838 08/19/20 1850   08/19/20 0900  vancomycin (VANCOREADY) IVPB 1500 mg/300 mL        1,500 mg 150 mL/hr over 120 Minutes Intravenous  Once 08/19/20 0831 08/19/20 1303   08/19/20 0845  piperacillin-tazobactam (ZOSYN) IVPB 3.375 g  3.375 g 100 mL/hr over 30 Minutes Intravenous  Once 08/19/20 0831 08/19/20 1103     Objective: Vitals: Today's Vitals   09/01/20 0806 09/01/20 0837 09/01/20 1103 09/01/20 1120  BP:   118/71 118/71  Pulse:   (!) 113 (!) 113  Resp:   (!) 38 (!) 41  Temp:   98.8 F (37.1 C)   TempSrc:   Oral   SpO2: 98%  97% 94%  Weight:      Height:      PainSc:  0-No pain      Intake/Output Summary (Last 24 hours) at 09/01/2020 1130 Last data filed at 09/01/2020 0552 Gross per 24 hour  Intake 474 ml  Output 2700 ml  Net -2226 ml   Filed Weights   08/25/20 0500 08/27/20 0536 08/29/20 0550  Weight: 80.5 kg 80 kg 80 kg   Weight change:   Intake/Output from previous day: 09/30 0701 - 10/01 0700 In: 933 [P.O.:933] Out: 3350 [Urine:3350] Intake/Output this shift: No intake/output data recorded.  Examination: General exam:AAOx3,weak,old for his age. HEENT:Oral mucosa moist,Ear/Nose WNL grossly,dentition normal. Respiratory system:Bilaterally basal crackles,no use of accessory muscle, tachypneic Cardiovascular system:S1 & S2 +, No JVD. Gastrointestinal system:Abdomen soft, NT,ND,BS+. Nervous System:Alert,awake, moving extremities and grossly nonfocal. Extremities: No edema,distal peripheral pulses palpable.  Skin:No rashes,no icterus. IWP:YKDXIP muscle bulk,tone,power.  Data Reviewed: I have personally reviewed following labs and imaging studies CBC: Recent Labs  Lab 08/28/20 0230 08/29/20 0229 08/30/20 0542 08/31/20 0104 09/01/20 0512  WBC 14.6* 13.3* 12.2* 9.2 6.8  HGB 12.7* 12.6* 12.1* 11.0*  11.5*  HCT 38.2* 38.4* 37.0* 33.8* 34.9*  MCV 85.8 87.9 90.7 89.2 89.9  PLT 244 243 205 198 382   Basic Metabolic Panel: Recent Labs  Lab 08/25/20 1206 08/25/20 1206 08/25/20 1900 08/26/20 0744 08/26/20 0744 08/26/20 1445 08/27/20 0346 08/27/20 0346 08/28/20 0230 08/29/20 0229 08/29/20 1628 08/30/20 0542 08/30/20 1730 08/31/20 0104 09/01/20 0512  NA  --   --   --  134*   < >  --  129*   < > 131*   < > 132* 134* 130* 131* 132*  K  --   --   --  5.0   < >  --  4.9   < > 5.2*   < > 5.4* 5.2* 5.8* 5.1 4.9  CL  --   --   --  94*   < >  --  94*   < > 94*   < > 92* 90* 89* 88* 88*  CO2  --   --   --  31   < >  --  29   < > 29   < > 30 36* 31 34* 32  GLUCOSE  --   --   --  186*   < >  --  131*   < > 171*   < > 276* 185* 365* 299* 323*  BUN  --   --   --  22   < >  --  16   < > 16   < > 23 19 22  25* 20  CREATININE  --   --   --  0.76   < >  --  0.68   < > 0.68   < > 0.84 0.67 0.70 0.70 0.66  CALCIUM  --   --   --  8.4*   < >  --  8.3*   < > 8.8*   < > 8.9 8.9 8.7*  8.6* 8.4*  MG 2.1  --  2.1 2.1  --  2.0  --   --   --   --   --   --   --   --   --   PHOS 1.9*   < > 2.5 1.8*  --  2.4* 3.5  --  3.3  --   --   --   --   --   --    < > = values in this interval not displayed.   GFR: Estimated Creatinine Clearance: 92.5 mL/min (by C-G formula based on SCr of 0.66 mg/dL). Liver Function Tests: Recent Labs  Lab 08/26/20 0744 08/27/20 0346 08/28/20 0230 08/29/20 0229  AST 30 26 27 24   ALT 57* 48* 51* 46*  ALKPHOS 65 62 67 66  BILITOT 0.9 0.4 0.4 0.5  PROT 5.3* 5.0* 5.4* 5.4*  ALBUMIN 2.2* 2.1* 2.2* 2.1*   No results for input(s): LIPASE, AMYLASE in the last 168 hours. No results for input(s): AMMONIA in the last 168 hours. Coagulation Profile: No results for input(s): INR, PROTIME in the last 168 hours. Cardiac Enzymes: No results for input(s): CKTOTAL, CKMB, CKMBINDEX, TROPONINI in the last 168 hours. BNP (last 3 results) No results for input(s): PROBNP in the last 8760  hours. HbA1C: No results for input(s): HGBA1C in the last 72 hours. CBG: Recent Labs  Lab 08/31/20 1216 08/31/20 1749 08/31/20 2127 09/01/20 0737 09/01/20 1102  GLUCAP 280* 357* 197* 318* 272*   Lipid Profile: No results for input(s): CHOL, HDL, LDLCALC, TRIG, CHOLHDL, LDLDIRECT in the last 72 hours. Thyroid Function Tests: No results for input(s): TSH, T4TOTAL, FREET4, T3FREE, THYROIDAB in the last 72 hours. Anemia Panel: No results for input(s): VITAMINB12, FOLATE, FERRITIN, TIBC, IRON, RETICCTPCT in the last 72 hours. Sepsis Labs: No results for input(s): PROCALCITON, LATICACIDVEN in the last 168 hours.  No results found for this or any previous visit (from the past 240 hour(s)).   Radiology Studies: No results found.   LOS: 13 days   Antonieta Pert, MD [facility]  09/01/2020, 11:30 AM

## 2020-09-01 NOTE — Progress Notes (Signed)
Inpatient Diabetes Program Recommendations  AACE/ADA: New Consensus Statement on Inpatient Glycemic Control (2015)  Target Ranges:  Prepandial:   less than 140 mg/dL      Peak postprandial:   less than 180 mg/dL (1-2 hours)      Critically ill patients:  140 - 180 mg/dL   Lab Results  Component Value Date   GLUCAP 272 (H) 09/01/2020   HGBA1C 7.2 (H) 08/08/2020    Review of Glycemic Control Results for HEAVEN, WANDELL (MRN [id number]) as of 09/01/2020 11:45  Ref. Range 08/31/2020 07:36 08/31/2020 12:16 08/31/2020 17:49 08/31/2020 21:27 09/01/2020 07:37 09/01/2020 11:02  Glucose-Capillary Latest Ref Range: 70 - 99 mg/dL 241 (H) 280 (H) 357 (H) 197 (H) 318 (H) 272 (H)   Diabetes history: DM 2 Outpatient Diabetes medications: Metformin 1000 mg bid, Glipizide 5 mg bid, Trulicity 1.5 mg Weekly Current orders for Inpatient glycemic control:  Lantus 20 units bid Novolog 0-15 units tid + hs Novolog 10 unit tid meal coverage Tradjenta 5 mg Daily  Ensure Enlive 4x/day  Inpatient Diabetes Program Recommendations:    - Increase Lantus to 25 units bid.  Thanks,  Tama Headings RN, MSN, BC-ADM Inpatient Diabetes Coordinator Team Pager [phone] (8a-5p)

## 2020-09-02 DIAGNOSIS — I10 Essential (primary) hypertension: Secondary | ICD-10-CM | POA: Diagnosis not present

## 2020-09-02 DIAGNOSIS — E119 Type 2 diabetes mellitus without complications: Secondary | ICD-10-CM | POA: Diagnosis not present

## 2020-09-02 DIAGNOSIS — J9601 Acute respiratory failure with hypoxia: Secondary | ICD-10-CM | POA: Diagnosis not present

## 2020-09-02 DIAGNOSIS — U071 COVID-19: Secondary | ICD-10-CM | POA: Diagnosis not present

## 2020-09-02 LAB — URINE CULTURE: Culture: >=100000 — AB

## 2020-09-02 LAB — GLUCOSE, CAPILLARY
Glucose-Capillary: 316 mg/dL — ABNORMAL HIGH (ref 70–99)
Glucose-Capillary: 200 mg/dL — ABNORMAL HIGH (ref 70–99)
Glucose-Capillary: 160 mg/dL — ABNORMAL HIGH (ref 70–99)
Glucose-Capillary: 216 mg/dL — ABNORMAL HIGH (ref 70–99)

## 2020-09-02 LAB — CBC
HCT: 33.2 % — ABNORMAL LOW (ref 39.0–52.0)
Hemoglobin: 10.7 g/dL — ABNORMAL LOW (ref 13.0–17.0)
MCH: 28.9 pg (ref 26.0–34.0)
MCHC: 32.2 g/dL (ref 30.0–36.0)
MCV: 89.7 fL (ref 80.0–100.0)
Platelets: 167 10*3/uL (ref 150–400)
RBC: 3.7 MIL/uL — ABNORMAL LOW (ref 4.22–5.81)
RDW: 14.3 % (ref 11.5–15.5)
WBC: 6.7 10*3/uL (ref 4.0–10.5)
nRBC: 0.4 % — ABNORMAL HIGH (ref 0.0–0.2)

## 2020-09-02 LAB — BASIC METABOLIC PANEL
Anion gap: 7 (ref 5–15)
BUN: 22 mg/dL (ref 8–23)
CO2: 32 mmol/L (ref 22–32)
Calcium: 8.3 mg/dL — ABNORMAL LOW (ref 8.9–10.3)
Chloride: 92 mmol/L — ABNORMAL LOW (ref 98–111)
Creatinine, Ser: 0.52 mg/dL — ABNORMAL LOW (ref 0.61–1.24)
GFR calc Af Amer: >60 mL/min (ref >60)
GFR calc non Af Amer: >60 mL/min (ref >60)
Glucose, Bld: 145 mg/dL — ABNORMAL HIGH (ref 70–99)
Potassium: 5 mmol/L (ref 3.5–5.1)
Sodium: 131 mmol/L — ABNORMAL LOW (ref 135–145)

## 2020-09-02 MED ORDER — FLEET ENEMA 7-19 GM/118ML RE ENEM
1.0000 | ENEMA | Freq: Once | RECTAL | Status: AC
  Administered 2020-09-02: 1 via RECTAL
  Filled 2020-09-02: qty 1

## 2020-09-02 MED ORDER — BISACODYL 10 MG RE SUPP
10.0000 mg | Freq: Once | RECTAL | Status: AC
  Administered 2020-09-02: 10 mg via RECTAL
  Filled 2020-09-02: qty 1

## 2020-09-02 NOTE — Progress Notes (Signed)
Patient ID: Tim Day, male   DOB: 1951-11-29, 69 y.o.   MRN: [id number]  PROGRESS NOTE    Tim Day  JIR:[id number] DOB: [birth year] DOA: 08/19/2020 PCP: Celene Squibb, MD    Brief Narrative:  69 year old male with history of GERD, hypertension, diabetes mellitus, history of DVT 2009/2010, who presents with worsening hypoxic respiratory failure and left pneumothorax ON 08/19/20. She has found to have worsening bilateral infiltrates chest tube placed at any pain ER and sent to [facility] for further management. Patient was managed in ICU, initially was intubated and since has been extubated and on Heated high flow nasal cannula facemask, has been off Covid unit and transferred to [facility] 9/25. He remains dependent on high flow nasal cannula, had left chest tube subsequently removed 9/28 by PCCM Had urine retention 9/30 and foley placed. Significant Hospital events 9/1 treated for COVID- [place] 9/3 - discharged on 6L [place] xxx 9/4 and 9/5 - decadron and remdesivir opd xxxx 9/7- readmitted with worsening hypoxemia and pneumomediastinum 9/8 -noted to have bilateral DVT,  9/13 -d/c home 9/13 on O2, xxx 9/18 -returned with pneumothorax-> 50L - 100% HFNC 9/18 [facility] to [facility], chest tube exchanged prior to transfer; chest tube occluded, required maniuplation. 9/19 second chest tube placed. Intubated. CVL placed.  9/20 weaning; appears comfortable on spontaneous breathing trial. Still having bloody output from both chest tubes, heparin infusion placed on hold for 8 hours. Extubated. Late in evening oxygen requirements trending up. sats 75% in spite of high flow. XRAY w/ large PTX.. chest tubes not functioning. Lateral chest tube removed. Obstructed w/ old clot. New 28 french chest tube placed.  9/21 still short of breath. airleak 1/7 on lateral tube w/ tidal req high flow and NRB Weaning oxygen again 9/22 9/23 -now in 52m ICU . Trx out of covid isolation. Not on vent. Not on  pressors. Very deconditioned. On HHFNC and FAce mask.  9/25 Tx to [facility] on HHFNC/nrb-with worsening hypoxia represents call, PCCM back on rounding team as consult Seen by critical care and planned for bedtime BiPAP and in the daytime on RRR far off and to hold tube feeds at the time of BiPAP. 9/28- left chest tube removed.  During the night patient has not been using BiPAP and feels comfortable on high flow nasal.  Repeat chest x-ray no evidence of residual pneumothorax, persistent moderate to marked severity bibasilar atelectasis and/or infiltrate 9/30: urine retention- foley placed by Dr Abner Greenspan.  Assessment & Plan:   Active Problems:   DM2 (diabetes mellitus, type 2) ([place])   Benign essential HTN   Dyslipidemia   Chest tube in place   COVID-19 virus infection   Acute hypoxemic respiratory failure due to COVID-19 [facility])   Pneumothorax, left   Pneumonia due to COVID-19 virus   Leg DVT (deep venous thromboembolism), acute, bilateral (HCC)   Pressure injury of skin   Acute respiratory failure with hypoxia ([place])   HCAP (healthcare-associated pneumonia)   Primary spontaneous pneumothorax   Protein-calorie malnutrition, severe   Acute hypoxic respiratory failure due to COVID ARDS and left pneumothorax: Completed covid 19 pneumonia treatment including baricitinib 9/24.  Remains on HFNC, continue to wean down oxygen, remains on high flow nasal cannula and working on ambulation PT OT along with respiratory support bronchodilators with plan to wean down oxygen. Now on 15 L high flow nasal cannula. Bedtime and as needed BiPAP but has not been using bedtime BiPAP for several nights as he feels comfortable only on  HFNC. Left chest tube off 9/28 PCCM signed off and chest x-ray- showed bibasilar moderate to marked infiltrates likely post Covid.  Left pneumothorax with chest tube clotting with blood, with very small right pneumothorax- pneumothorax resolved after placing new lateral 28 [place], , s/p removal of  apical chest tube. 9/28- left lateral chest tube also removed.Repeat chest x-ray 9/28 night  no evidence of residual pneumothorax, persistent moderate to marked severity bibasilar atelectasis and/or infiltrate.  Continue to wean down oxygen as tolerated.   Urine retention bladder scan with 538 ml urine nursing unable to insert Foley catheter multiple times Dr. Raliegh Ip was kind enough to come in and insert a Foley catheter and recommend voiding trial in 3 to 5 days (will likely attempt tomorrow), follow-up urine culture, UA grossly abnormal. Holding off antibiotics for now unless spikes fever or has worsening leukocytosis.  B/L lower extremity DVT 08/09/2021 with history of upper extremity DVT in 2009 2010:was on Eliquis at home - here iv heparin switched to lovenox 9/26.  Will transition to Eliquis, consulted pharmacy  Hyperkalemia: In the setting of hyperglycemia, status post Lokelma x3, potassium at 4.9 hold off on further Lokelma.  Mild hyponatremia; sodium 132 monitor. Likely pseudohyponatremia from hyperglycemia.  Hypophosphatemia: Resolved.  EKG changes on 9/24 and history of chest pain troponin 184>191:Seen by cardiology  no further plan Echo reassuring with normal EF no regional wall motion abnormalities  Benign essential HTN: BP is well controlled.  Cont home amlodipine and Benicar HCTZ is on hold.    DM2: Controlled hemoglobin A1c 7.2,on lantus 25 units q am and glipizide 5 mg bid,metformin at home.blood sugar fluctuating as patient's oral intake is picking up, core track has been discontinued.  Lantus increased to 20 units twice daily and NovoLog 10 units 3 times daily along with SSI.  Continue to monitor and adjust insulin.   New onset constipation today: Has tried Colace and MiraLAX we will add Dulcolax suppository.  YYT:[id number] speech input. On carb modified diet, off core track 9/30.he is on a carb modified diet.  Continue to augment nutrition per dietitian.    Pressure ulcer  POA as below. Pressure Ulcer: Pressure Injury 08/19/20 Buttocks Bilateral Stage 2 -  Partial thickness loss of dermis presenting as a shallow open injury with a red, pink wound bed without slough. (Active)  08/19/20 1853  Location: Buttocks  Location Orientation: Bilateral  Staging: Stage 2 -  Partial thickness loss of dermis presenting as a shallow open injury with a red, pink wound bed without slough.  Wound Description (Comments):   Present on Admission: Yes    Severe protein calorie malnutrition as below, augment diet as above.   Nutrition Problem: Severe Malnutrition Etiology: acute illness (COVID19) Signs/Symptoms: mild fat depletion, mild muscle depletion, moderate muscle depletion, severe muscle depletion, moderate fat depletion, percent weight loss (13.3% weight loss in less than 1 month) Percent weight loss: 13.3 % Interventions: Tube feeding Body mass index is 25.31 kg/m. Patient is at risk of decompensation.Continue to monitor closely.  DVT prophylaxis: XN:[id number] Code Status: Full code  Family Communication: Patient and wife at bedside Disposition Plan: Anticipate discharge is to inpatient rehab or contract versus select.  Patient remains inpatient status due to ongoing oxygen needs and safe discharge plan.   Consultants:   Cardiology  Urology  Critical care  Procedures:  Indwelling Foley  Core track  Chest tube insertion x2  2D echo  Antimicrobials: Anti-infectives (From admission, onward)   Start     Dose/Rate  Route Frequency Ordered Stop   08/31/20 2230  sulfamethoxazole-trimethoprim (BACTRIM DS) 800-160 MG per tablet 1 tablet        1 tablet Oral Every 12 hours 08/31/20 2201     08/19/20 2000  piperacillin-tazobactam (ZOSYN) IVPB 3.375 g        3.375 g 12.5 mL/hr over 240 Minutes Intravenous Every 8 hours 08/19/20 1850 08/23/20 2359   08/19/20 1845  piperacillin-tazobactam (ZOSYN) IVPB 3.375 g  Status:  Discontinued        3.375 g 100 mL/hr  over 30 Minutes Intravenous  Once 08/19/20 1838 08/19/20 1850   08/19/20 0900  vancomycin (VANCOREADY) IVPB 1500 mg/300 mL        1,500 mg 150 mL/hr over 120 Minutes Intravenous  Once 08/19/20 0831 08/19/20 1303   08/19/20 0845  piperacillin-tazobactam (ZOSYN) IVPB 3.375 g        3.375 g 100 mL/hr over 30 Minutes Intravenous  Once 08/19/20 0831 08/19/20 1103       Subjective: Patient on Colace and MiraLAX with no success and achieving a bowel movement.  Otherwise he just feels weak continues to be limited by shortness of breath associated with exertion.  Objective: Vitals:   09/02/20 0749 09/02/20 0900 09/02/20 0953 09/02/20 1059  BP:   123/76 123/74  Pulse: (!) 107 (!) 108 (!) 106 (!) 106  Resp: (!) 36 (!) 43    Temp:    97.8 F (36.6 C)  TempSrc:    Axillary  SpO2: 96% 99%  96%  Weight:      Height:        Intake/Output Summary (Last 24 hours) at 09/02/2020 1347 Last data filed at 09/02/2020 1321 Gross per 24 hour  Intake 462 ml  Output 2400 ml  Net -1938 ml   Filed Weights   08/25/20 0500 08/27/20 0536 08/29/20 0550  Weight: 80.5 kg 80 kg 80 kg    Examination:  General exam: Appears calm and comfortable  Respiratory system: Clear to auscultation. Respiratory effort normal. Cardiovascular system: S1 & S2 heard, RRR.  Gastrointestinal system: Abdomen is nondistended, soft and nontender.  Central nervous system: Alert and oriented. No focal neurological deficits. Extremities: Symmetric  Skin: No rashes Psychiatry: Judgement and insight appear normal. Mood & affect appropriate.     Data Reviewed: I have personally reviewed following labs and imaging studies  CBC: Recent Labs  Lab 08/29/20 0229 08/30/20 0542 08/31/20 0104 09/01/20 0512 09/02/20 1046  WBC 13.3* 12.2* 9.2 6.8 6.7  HGB 12.6* 12.1* 11.0* 11.5* 10.7*  HCT 38.4* 37.0* 33.8* 34.9* 33.2*  MCV 87.9 90.7 89.2 89.9 89.7  PLT 243 205 198 183 947   Basic Metabolic Panel: Recent Labs  Lab  08/26/20 1445 08/27/20 0346 08/27/20 0346 08/28/20 0230 08/29/20 0229 08/29/20 1628 08/30/20 0542 08/30/20 1730 08/31/20 0104 09/01/20 0512  NA  --  129*   < > 131*   < > 132* 134* 130* 131* 132*  K  --  4.9   < > 5.2*   < > 5.4* 5.2* 5.8* 5.1 4.9  CL  --  94*   < > 94*   < > 92* 90* 89* 88* 88*  CO2  --  29   < > 29   < > 30 36* 31 34* 32  GLUCOSE  --  131*   < > 171*   < > 276* 185* 365* 299* 323*  BUN  --  16   < > 16   < >  23 19 22  25* 20  CREATININE  --  0.68   < > 0.68   < > 0.84 0.67 0.70 0.70 0.66  CALCIUM  --  8.3*   < > 8.8*   < > 8.9 8.9 8.7* 8.6* 8.4*  MG 2.0  --   --   --   --   --   --   --   --   --   PHOS 2.4* 3.5  --  3.3  --   --   --   --   --   --    < > = values in this interval not displayed.   GFR: Estimated Creatinine Clearance: 92.5 mL/min (by C-G formula based on SCr of 0.66 mg/dL). Liver Function Tests: Recent Labs  Lab 08/27/20 0346 08/28/20 0230 08/29/20 0229  AST 26 27 24   ALT 48* 51* 46*  ALKPHOS 62 67 66  BILITOT 0.4 0.4 0.5  PROT 5.0* 5.4* 5.4*  ALBUMIN 2.1* 2.2* 2.1*   CBG: Recent Labs  Lab 09/01/20 1102 09/01/20 1600 09/01/20 2213 09/02/20 0743 09/02/20 1127  GLUCAP 272* 269* 406* 316* 200*     Recent Results (from the past 240 hour(s))  Culture, Urine     Status: Abnormal   Collection Time: 08/31/20  5:35 PM   Specimen: Urine, Catheterized  Result Value Ref Range Status   Specimen Description URINE, CATHETERIZED  Final   Special Requests   Final    NONE Performed at [facility], [place] [address], [place], [place] [address]    Culture >=100,000 COLONIES/mL ENTEROBACTER AEROGENES (A)  Final   Report Status 09/02/2020 FINAL  Final   Organism ID, Bacteria ENTEROBACTER AEROGENES (A)  Final      Susceptibility   Enterobacter aerogenes - MIC*    CEFAZOLIN RESISTANT Resistant     CEFTRIAXONE <=0.25 SENSITIVE Sensitive     CIPROFLOXACIN <=0.25 SENSITIVE Sensitive     GENTAMICIN <=1 SENSITIVE Sensitive     IMIPENEM 2  SENSITIVE Sensitive     NITROFURANTOIN 32 SENSITIVE Sensitive     TRIMETH/SULFA <=20 SENSITIVE Sensitive     PIP/TAZO <=4 SENSITIVE Sensitive     * >=100,000 COLONIES/mL ENTEROBACTER AEROGENES      Radiology Studies: No results found.   Scheduled Meds: . apixaban  5 mg Oral BID  . bisacodyl  10 mg Rectal Once  . chlorhexidine  15 mL Mouth Rinse BID  . Chlorhexidine Gluconate Cloth  6 each Topical Daily  . docusate  100 mg Oral BID  . feeding supplement (ENSURE ENLIVE)  237 mL Oral QID  . insulin aspart  0-15 Units Subcutaneous TID WC  . insulin aspart  0-5 Units Subcutaneous QHS  . insulin aspart  10 Units Subcutaneous TID WC  . insulin glargine  20 Units Subcutaneous BID  . linagliptin  5 mg Oral Daily  . mouth rinse  15 mL Mouth Rinse q12n4p  . pantoprazole sodium  40 mg Per Tube QHS  . polyethylene glycol  17 g Oral BID  . sulfamethoxazole-trimethoprim  1 tablet Oral Q12H  . tamsulosin  0.4 mg Oral Daily   Continuous Infusions: . sodium chloride       LOS: 14 days    Donnamae Jude, MD 09/02/2020 1:47 PM [phone] [facility] If 7PM-7AM, please contact night-coverage 09/02/2020, 1:47 PM

## 2020-09-02 NOTE — Progress Notes (Signed)
Physical Therapy Treatment Patient Details Name: Izekiel Flegel MRN: [id number] DOB: 1951/11/03 Today's Date: 09/02/2020    History of Present Illness 69 year old with hx of recent COVID pneumonia diagnosis (9/7-9/13, discharge on 2L O2), BL COVID induced DVTs, DM2, GERD HTN presenting with worsening SOB. Per chart found to have left pneumothorax and worsening bilateral infiltrates. Intubated 9/19-9/20     PT Comments    The pt was in bed upon arrival of PT, agreeable to session this afternoon with focus on progressing activity tolerance and possibly OOB transfers. The pt was able to tolerate ~12 min sitting EOB with 25L Heated High Flow, HR 108-115bpm, SpO2 90-100%, and RR 24-50. The pt was able to maintain RR in 20s-30s with guided PLB initially, but able to maintain without cues by the end of seated activity. The pt declined further transfers at this time due to fatigue, was unable to complete lateral scoot transfers despite assist, returning instead to sidelying in bed. The pt and his wife were then educated in importance of continued PLB practice through day while not engaging in PT, as well as exercises for BLE to increased strength and mobility outside of PT sessions. The pt will continue to benefit from intensive therapies following d/c to improve strength, activity tolerance, endurance, and stability.    Follow Up Recommendations  CIR;Supervision/Assistance - 24 hour     Equipment Recommendations  None recommended by PT (defer to post acute)    Recommendations for Other Services       Precautions / Restrictions Precautions Precautions: None;Fall Precaution Comments: watch 02, watch HR, and RR Restrictions Weight Bearing Restrictions: No    Mobility  Bed Mobility Overal bed mobility: Needs Assistance Bed Mobility: Supine to Sit;Sit to Supine Rolling: Mod assist;+2 for physical assistance   Supine to sit: Mod assist;+2 for physical assistance     General bed  mobility comments: minA to move BLE to EOB, modA of 2 for trunk and scooting hips to EOB.  minA initially to maintain seated balance. RR to 50s initially, able to guide PLB and reduce RR to mid 20s, low 30s.   Transfers                 General transfer comment: deferred at this time due to pt fatigue and elevated RR sitting EOB. pt declined lateral scoot, laying back down onto his side instead of attempting scoot     Balance Overall balance assessment: Needs assistance Sitting-balance support: Feet supported;Single extremity supported;No upper extremity supported Sitting balance-Leahy Scale: Fair Sitting balance - Comments: able to sit without suppor                                    Cognition Arousal/Alertness: Awake/alert Behavior During Therapy: Anxious;WFL for tasks assessed/performed Overall Cognitive Status: Within Functional Limits for tasks assessed                                 General Comments: continued repeated cues for PLB at EOB      Exercises General Exercises - Lower Extremity Quad Sets: AROM;Both;5 reps;Supine Short Arc Quad: AAROM;Both;5 reps;Supine    General Comments General comments (skin integrity, edema, etc.): 25L HFNC with SpO2 in 90s. HR 108-115bpm, RR 20-50      Pertinent Vitals/Pain Pain Assessment: No/denies pain Pain Intervention(s): Monitored during session  PT Goals (current goals can now be found in the care plan section) Acute Rehab PT Goals Patient Stated Goal: get better, get home when able PT Goal Formulation: With patient Time For Goal Achievement: 09/08/20 Potential to Achieve Goals: Good Progress towards PT goals: Progressing toward goals (very slowly)    Frequency    Min 3X/week      PT Plan Current plan remains appropriate       AM-PAC PT "6 Clicks" Mobility   Outcome Measure  Help needed turning from your back to your side while in a flat bed without using  bedrails?: A Little Help needed moving from lying on your back to sitting on the side of a flat bed without using bedrails?: A Lot Help needed moving to and from a bed to a chair (including a wheelchair)?: Total Help needed standing up from a chair using your arms (e.g., wheelchair or bedside chair)?: Total Help needed to walk in hospital room?: Total Help needed climbing 3-5 steps with a railing? : Total 6 Click Score: 9    End of Session Equipment Utilized During Treatment: Oxygen Activity Tolerance: Patient limited by fatigue Patient left: in bed;with family/visitor present;with call bell/phone within reach Nurse Communication: Mobility status PT Visit Diagnosis: Other abnormalities of gait and mobility (R26.89);Muscle weakness (generalized) (M62.81);Difficulty in walking, not elsewhere classified (R26.2)     Time: 1440-1500 PT Time Calculation (min) (ACUTE ONLY): 20 min  Charges:  $Therapeutic Activity: 8-22 mins                     Karma Ganja, PT, DPT   Acute Rehabilitation Department Pager #: [phone]   Otho Bellows 09/02/2020, 3:40 PM

## 2020-09-02 NOTE — Progress Notes (Signed)
RT called to patient beside to remove BIPAP from patient. RT placed patient back on HHFNC 25L and 60%. Patient wore BIPAP for about 4 hours last. Patient in no distress at this time.

## 2020-09-02 NOTE — Progress Notes (Signed)
Patient has had an uneventful day, voiding well with urinary catheter in place. He is resting with wife at bedside. Continue plan of care.

## 2020-09-03 DIAGNOSIS — J9601 Acute respiratory failure with hypoxia: Secondary | ICD-10-CM | POA: Diagnosis not present

## 2020-09-03 DIAGNOSIS — U071 COVID-19: Secondary | ICD-10-CM | POA: Diagnosis not present

## 2020-09-03 DIAGNOSIS — I10 Essential (primary) hypertension: Secondary | ICD-10-CM | POA: Diagnosis not present

## 2020-09-03 DIAGNOSIS — E119 Type 2 diabetes mellitus without complications: Secondary | ICD-10-CM | POA: Diagnosis not present

## 2020-09-03 LAB — BASIC METABOLIC PANEL
Anion gap: 9 (ref 5–15)
BUN: 21 mg/dL (ref 8–23)
CO2: 31 mmol/L (ref 22–32)
Calcium: 8.4 mg/dL — ABNORMAL LOW (ref 8.9–10.3)
Chloride: 91 mmol/L — ABNORMAL LOW (ref 98–111)
Creatinine, Ser: 0.66 mg/dL (ref 0.61–1.24)
GFR calc Af Amer: >60 mL/min (ref >60)
GFR calc non Af Amer: >60 mL/min (ref >60)
Glucose, Bld: 230 mg/dL — ABNORMAL HIGH (ref 70–99)
Potassium: 5 mmol/L (ref 3.5–5.1)
Sodium: 131 mmol/L — ABNORMAL LOW (ref 135–145)

## 2020-09-03 LAB — CBC
HCT: 35.1 % — ABNORMAL LOW (ref 39.0–52.0)
Hemoglobin: 11.6 g/dL — ABNORMAL LOW (ref 13.0–17.0)
MCH: 29.4 pg (ref 26.0–34.0)
MCHC: 33 g/dL (ref 30.0–36.0)
MCV: 89.1 fL (ref 80.0–100.0)
Platelets: 180 10*3/uL (ref 150–400)
RBC: 3.94 MIL/uL — ABNORMAL LOW (ref 4.22–5.81)
RDW: 14.5 % (ref 11.5–15.5)
WBC: 7.8 10*3/uL (ref 4.0–10.5)
nRBC: 0.6 % — ABNORMAL HIGH (ref 0.0–0.2)

## 2020-09-03 LAB — GLUCOSE, CAPILLARY
Glucose-Capillary: 286 mg/dL — ABNORMAL HIGH (ref 70–99)
Glucose-Capillary: 247 mg/dL — ABNORMAL HIGH (ref 70–99)
Glucose-Capillary: 228 mg/dL — ABNORMAL HIGH (ref 70–99)
Glucose-Capillary: 229 mg/dL — ABNORMAL HIGH (ref 70–99)

## 2020-09-03 MED ORDER — IPRATROPIUM BROMIDE HFA 17 MCG/ACT IN AERS
2.0000 | INHALATION_SPRAY | RESPIRATORY_TRACT | Status: DC
  Filled 2020-09-03: qty 12.9

## 2020-09-03 MED ORDER — SODIUM CHLORIDE 0.9 % IV SOLN
2.0000 g | INTRAVENOUS | Status: DC
  Administered 2020-09-03: 2 g via INTRAVENOUS
  Filled 2020-09-03: qty 2
  Filled 2020-09-03: qty 20

## 2020-09-03 MED ORDER — INSULIN ASPART 100 UNIT/ML ~~LOC~~ SOLN
12.0000 [IU] | Freq: Three times a day (TID) | SUBCUTANEOUS | Status: DC
  Administered 2020-09-04 – 2020-09-11 (×23): 12 [IU] via SUBCUTANEOUS

## 2020-09-03 MED ORDER — INSULIN GLARGINE 100 UNIT/ML ~~LOC~~ SOLN
22.0000 [IU] | Freq: Two times a day (BID) | SUBCUTANEOUS | Status: DC
  Administered 2020-09-03 – 2020-09-05 (×4): 22 [IU] via SUBCUTANEOUS
  Filled 2020-09-03 (×5): qty 0.22

## 2020-09-03 NOTE — Progress Notes (Signed)
Patient ID: Tim Day, male   DOB: February 14, 1951, 69 y.o.   MRN: [id number]  PROGRESS NOTE    Tim Day  EHU:[id number] DOB: 15-Sep-1951 DOA: 08/19/2020 PCP: Celene Squibb, MD    Brief Narrative:  69 year old male with history of GERD, hypertension, diabetes mellitus, history of DVT 2009/2010, who presents with worsening hypoxic respiratory failure and left pneumothorax ON 08/19/20. She has found to have worsening bilateral infiltrates chest tube placed at any pain ER and sent to [facility] for further management. Patient was managed in ICU, initially was intubated and since has been extubated and on Heated high flow nasal cannula facemask, has been off Covid unit and transferred to [facility] 9/25. He remainsdependent on high flow nasal cannula, had left chest tube subsequently removed 9/28 by PCCM Had urine retention 9/30 and foley placed. Significant Hospital events 9/1 treated for COVID- [place] 9/3 - discharged on 6L [place] 9/4 and 9/5 - decadron and remdesivir  9/7- readmitted with worsening hypoxemia and pneumomediastinum 9/8 -noted to have bilateral DVT,  9/13 -d/c home 9/13 on O2, 9/18 -returned with pneumothorax-> 50L - 100% HFNC 9/18 [facility] to [facility], chest tube exchanged prior to transfer; chest tube occluded, required maniuplation. 9/19 second chest tube placed. Intubated. CVL placed.  9/20 weaning; appears comfortable on spontaneous breathing trial. Still having bloody output from both chest tubes, heparin infusion placed on hold for 8 hours. Extubated. Late in evening oxygen requirements trending up. sats 75% in spite of high flow. XRAY w/ large PTX.. chest tubes not functioning. Lateral chest tube removed. Obstructed w/ old clot. New 28 french chest tube placed.  9/21 still short of breath. airleak 1/7 on lateral tube w/ tidal req high flow and NRB Weaning oxygen again 9/22 9/23 -now in 26m ICU . Trx out of covid isolation. Not on vent. Not on pressors. Very  deconditioned. On HHFNC and FAce mask.  9/25 Tx to [facility] on HHFNC/nrb-with worsening hypoxia represents call, PCCM back on rounding team as consult Seen by critical care and planned for bedtime BiPAP and in the daytime on RRR far off and to hold tube feeds at the time of BiPAP. 9/28- left chest tube removed. During the night patient has not been using BiPAP and feels comfortable on high flow nasal. Repeat chest x-ray no evidence of residual pneumothorax, persistent moderate to marked severity bibasilar atelectasis and/or infiltrate 9/30: urine retention- foley placed by Dr Abner Greenspan.   Assessment & Plan:   Active Problems:   DM2 (diabetes mellitus, type 2) ([place])   Benign essential HTN   Dyslipidemia   Chest tube in place   COVID-19 virus infection   Acute hypoxemic respiratory failure due to COVID-19 [facility])   Pneumothorax, left   Pneumonia due to COVID-19 virus   Leg DVT (deep venous thromboembolism), acute, bilateral (HCC)   Pressure injury of skin   Acute respiratory failure with hypoxia ([place])   HCAP (healthcare-associated pneumonia)   Primary spontaneous pneumothorax   Protein-calorie malnutrition, severe  Acute hypoxic respiratory failure due to COVID ARDS and left pneumothorax: Completed covid 19 pneumonia treatment including baricitinib 9/24.Remains on HFNC, continue to wean down oxygen, remains on high flow nasal cannula and working on ambulation PT OT along with respiratory support bronchodilators with plan to wean down oxygen. Now on 35 L high flow nasal cannula. Bedtime and as needed BiPAP but has not been using bedtime BiPAP for several nights as he feels comfortable only on HFNC. Left chest tube off 9/28 PCCM signed  off and chest x-ray- showed bibasilar moderate to marked infiltrates likely post Covid.  Left pneumothorax with chest tube clotting with blood, with very small right pneumothorax- pneumothorax resolved after placing new lateral 28 [place], , s/p removal of apical chest  tube. 9/28- left lateral chest tube also removed.Repeat chest x-ray 9/28 night no evidence of residual pneumothorax, persistent moderate to marked severity bibasilar atelectasis and/or infiltrate. Continue to wean down oxygen as tolerated.   Urine retentionbladder scan with 538 ml urinenursing unable to insert Foley catheter multiple times Dr. Raliegh Ip was kind enough to come in and insert a Foley catheter and recommend voiding trial in 3 to 5 days (will likely attempt tomorrow), follow-up urine culture, UAgrossly abnormal. Urine growing Enterobacter, will add Rocephin x 5 days.  B/Llower extremity DVT 08/09/2021 with history of upper extremity DVT in 2009 2010:was on Eliquis at home - here iv heparin switched to lovenox 9/26.Will transition to Eliquis, consulted pharmacy  Hyperkalemia:In the setting of hyperglycemia, status post Lokelma x3, potassium at4.9 hold off on further Lokelma.  Mild hyponatremia;sodium 132 monitor.Likely pseudohyponatremia from hyperglycemia.  Hypophosphatemia:Resolved.  EKG changes on 9/24 and history of chest pain troponin 184>191:Seen by cardiology no further plan Echo reassuring with normal EF no regional wall motion abnormalities  Benign essential HTN:BP is well controlled.Cont homeamlodipine and Benicar HCTZ is on hold.   DM2: Controlled hemoglobin A1c 7.2,on lantus 25 units q am and glipizide 5 mg bid,metformin at home.blood sugar fluctuating as patient's oral intake is picking up,core track has been discontinued. Lantus increased to 22 units twice daily and NovoLog 12 units 3 times daily along with SSI. Continue to monitor and adjust insulin.   New onset constipation today: Has tried Colace and MiraLAX we will add Dulcolax suppository.  IHK:VQQVZDGLOV speech input. On carb modified diet,offcore track 9/30.he is on a carb modified diet. Continue to augment nutrition per dietitian.    DVT prophylaxis: FI:EPPIRJJ Code Status: Full code   Family Communication: Patient and wife at bedtime Disposition Plan: Is for discharge to inpatient rehab or SNF.   Consultants:   Cardiology  Neurology  Critical care  Procedures:  Indwelling Foley  Cortrak  Chest tube insertion x2  2D echo  Intubation 9/19-2 9/20  Central venous line placement  Antimicrobials: Anti-infectives (From admission, onward)   Start     Dose/Rate Route Frequency Ordered Stop   08/31/20 2230  sulfamethoxazole-trimethoprim (BACTRIM DS) 800-160 MG per tablet 1 tablet        1 tablet Oral Every 12 hours 08/31/20 2201     08/19/20 2000  piperacillin-tazobactam (ZOSYN) IVPB 3.375 g        3.375 g 12.5 mL/hr over 240 Minutes Intravenous Every 8 hours 08/19/20 1850 08/23/20 2359   08/19/20 1845  piperacillin-tazobactam (ZOSYN) IVPB 3.375 g  Status:  Discontinued        3.375 g 100 mL/hr over 30 Minutes Intravenous  Once 08/19/20 1838 08/19/20 1850   08/19/20 0900  vancomycin (VANCOREADY) IVPB 1500 mg/300 mL        1,500 mg 150 mL/hr over 120 Minutes Intravenous  Once 08/19/20 0831 08/19/20 1303   08/19/20 0845  piperacillin-tazobactam (ZOSYN) IVPB 3.375 g        3.375 g 100 mL/hr over 30 Minutes Intravenous  Once 08/19/20 0831 08/19/20 1103       Subjective: No constipation today, had relief after enema yesterday.  Objective: Vitals:   09/03/20 0807 09/03/20 1125 09/03/20 1400 09/03/20 1741  BP:    113/75  Pulse:    96  Resp:    (!) 28  Temp:  97.9 F (36.6 C)  98.6 F (37 C)  TempSrc:  Oral  Oral  SpO2: 93%  97% 97%  Weight:      Height:        Intake/Output Summary (Last 24 hours) at 09/03/2020 1759 Last data filed at 09/03/2020 1300 Gross per 24 hour  Intake 440 ml  Output 1500 ml  Net -1060 ml   Filed Weights   08/25/20 0500 08/27/20 0536 08/29/20 0550  Weight: 80.5 kg 80 kg 80 kg    Examination:  General exam: Appears calm and comfortable  Respiratory system: Occasional rhonchi respiratory effort  normal. Cardiovascular system: S1 & S2 heard, RRR.  Gastrointestinal system: Abdomen is nondistended, soft and nontender.  Central nervous system: Alert and oriented. No focal neurological deficits. Extremities: Symmetric  Skin: No rashes Psychiatry: Judgement and insight appear normal. Mood & affect appropriate.     Data Reviewed: I have personally reviewed following labs and imaging studies  CBC: Recent Labs  Lab 08/30/20 0542 08/31/20 0104 09/01/20 0512 09/02/20 1046 09/03/20 0220  WBC 12.2* 9.2 6.8 6.7 7.8  HGB 12.1* 11.0* 11.5* 10.7* 11.6*  HCT 37.0* 33.8* 34.9* 33.2* 35.1*  MCV 90.7 89.2 89.9 89.7 89.1  PLT 205 198 183 167 734   Basic Metabolic Panel: Recent Labs  Lab 08/28/20 0230 08/29/20 0229 08/30/20 1730 08/31/20 0104 09/01/20 0512 09/02/20 1635 09/03/20 0220  NA 131*   < > 130* 131* 132* 131* 131*  K 5.2*   < > 5.8* 5.1 4.9 5.0 5.0  CL 94*   < > 89* 88* 88* 92* 91*  CO2 29   < > 31 34* 32 32 31  GLUCOSE 171*   < > 365* 299* 323* 145* 230*  BUN 16   < > 22 25* 20 22 21   CREATININE 0.68   < > 0.70 0.70 0.66 0.52* 0.66  CALCIUM 8.8*   < > 8.7* 8.6* 8.4* 8.3* 8.4*  PHOS 3.3  --   --   --   --   --   --    < > = values in this interval not displayed.   GFR: Estimated Creatinine Clearance: 92.5 mL/min (by C-G formula based on SCr of 0.66 mg/dL). Liver Function Tests: Recent Labs  Lab 08/28/20 0230 08/29/20 0229  AST 27 24  ALT 51* 46*  ALKPHOS 67 66  BILITOT 0.4 0.5  PROT 5.4* 5.4*  ALBUMIN 2.2* 2.1*   CBG: Recent Labs  Lab 09/02/20 1639 09/02/20 2155 09/03/20 0723 09/03/20 1124 09/03/20 1701  GLUCAP 160* 216* 286* 247* 228*     Recent Results (from the past 240 hour(s))  Culture, Urine     Status: Abnormal   Collection Time: 08/31/20  5:35 PM   Specimen: Urine, Catheterized  Result Value Ref Range Status   Specimen Description URINE, CATHETERIZED  Final   Special Requests   Final    NONE Performed at [facility], [place] [address] [place], [place] [address]    Culture >=100,000 COLONIES/mL ENTEROBACTER AEROGENES (A)  Final   Report Status 09/02/2020 FINAL  Final   Organism ID, Bacteria ENTEROBACTER AEROGENES (A)  Final      Susceptibility   Enterobacter aerogenes - MIC*    CEFAZOLIN RESISTANT Resistant     CEFTRIAXONE <=0.25 SENSITIVE Sensitive     CIPROFLOXACIN <=0.25 SENSITIVE Sensitive     GENTAMICIN <=1 SENSITIVE Sensitive  IMIPENEM 2 SENSITIVE Sensitive     NITROFURANTOIN 32 SENSITIVE Sensitive     TRIMETH/SULFA <=20 SENSITIVE Sensitive     PIP/TAZO <=4 SENSITIVE Sensitive     * >=100,000 COLONIES/mL ENTEROBACTER AEROGENES      Radiology Studies: No results found.   Scheduled Meds: . apixaban  5 mg Oral BID  . chlorhexidine  15 mL Mouth Rinse BID  . Chlorhexidine Gluconate Cloth  6 each Topical Daily  . docusate  100 mg Oral BID  . feeding supplement (ENSURE ENLIVE)  237 mL Oral QID  . insulin aspart  0-15 Units Subcutaneous TID WC  . insulin aspart  0-5 Units Subcutaneous QHS  . insulin aspart  10 Units Subcutaneous TID WC  . insulin glargine  20 Units Subcutaneous BID  . ipratropium  2 puff Inhalation Q4H  . linagliptin  5 mg Oral Daily  . mouth rinse  15 mL Mouth Rinse q12n4p  . pantoprazole sodium  40 mg Per Tube QHS  . polyethylene glycol  17 g Oral BID  . sulfamethoxazole-trimethoprim  1 tablet Oral Q12H  . tamsulosin  0.4 mg Oral Daily   Continuous Infusions: . sodium chloride       LOS: 15 days    Donnamae Jude, MD 09/03/2020 5:59 PM [phone] [facility] If 7PM-7AM, please contact night-coverage 09/03/2020, 5:59 PM

## 2020-09-03 NOTE — Progress Notes (Signed)
Pt's airway blocked momentarily with large mucous plug. C/O SOB, SpO2% dropped to 82. Suction at bedside, however pt cleared airway on his own. NRB mask applied. Sats recovering with addl O2.

## 2020-09-04 DIAGNOSIS — J9601 Acute respiratory failure with hypoxia: Secondary | ICD-10-CM | POA: Diagnosis not present

## 2020-09-04 LAB — CBC
HCT: 34.3 % — ABNORMAL LOW (ref 39.0–52.0)
Hemoglobin: 10.9 g/dL — ABNORMAL LOW (ref 13.0–17.0)
MCH: 28.3 pg (ref 26.0–34.0)
MCHC: 31.8 g/dL (ref 30.0–36.0)
MCV: 89.1 fL (ref 80.0–100.0)
Platelets: 165 10*3/uL (ref 150–400)
RBC: 3.85 MIL/uL — ABNORMAL LOW (ref 4.22–5.81)
RDW: 14.6 % (ref 11.5–15.5)
WBC: 7.1 10*3/uL (ref 4.0–10.5)
nRBC: 0.6 % — ABNORMAL HIGH (ref 0.0–0.2)

## 2020-09-04 LAB — BASIC METABOLIC PANEL
Anion gap: 8 (ref 5–15)
BUN: 23 mg/dL (ref 8–23)
CO2: 30 mmol/L (ref 22–32)
Calcium: 8 mg/dL — ABNORMAL LOW (ref 8.9–10.3)
Chloride: 93 mmol/L — ABNORMAL LOW (ref 98–111)
Creatinine, Ser: 0.68 mg/dL (ref 0.61–1.24)
GFR calc Af Amer: >60 mL/min (ref >60)
GFR calc non Af Amer: >60 mL/min (ref >60)
Glucose, Bld: 225 mg/dL — ABNORMAL HIGH (ref 70–99)
Potassium: 4.8 mmol/L (ref 3.5–5.1)
Sodium: 131 mmol/L — ABNORMAL LOW (ref 135–145)

## 2020-09-04 LAB — GLUCOSE, CAPILLARY
Glucose-Capillary: 349 mg/dL — ABNORMAL HIGH (ref 70–99)
Glucose-Capillary: 226 mg/dL — ABNORMAL HIGH (ref 70–99)
Glucose-Capillary: 214 mg/dL — ABNORMAL HIGH (ref 70–99)
Glucose-Capillary: 150 mg/dL — ABNORMAL HIGH (ref 70–99)

## 2020-09-04 MED ORDER — LORAZEPAM 0.5 MG PO TABS
0.5000 mg | ORAL_TABLET | Freq: Two times a day (BID) | ORAL | Status: DC | PRN
  Administered 2020-09-04 – 2020-09-16 (×14): 0.5 mg via ORAL
  Filled 2020-09-04 (×14): qty 1

## 2020-09-04 MED ORDER — ALBUTEROL SULFATE HFA 108 (90 BASE) MCG/ACT IN AERS
2.0000 | INHALATION_SPRAY | Freq: Four times a day (QID) | RESPIRATORY_TRACT | Status: DC
  Administered 2020-09-04 – 2020-09-12 (×26): 2 via RESPIRATORY_TRACT

## 2020-09-04 MED ORDER — LEVALBUTEROL HCL 1.25 MG/0.5ML IN NEBU: 1.2500 mg | INHALATION_SOLUTION | Freq: Four times a day (QID) | RESPIRATORY_TRACT | Status: DC | PRN

## 2020-09-04 NOTE — Progress Notes (Signed)
Rehab Admissions Coordinator Note:  Patient was screened by Cleatrice Burke for appropriateness for an Inpatient Acute Rehab Consult per therapy recs. Noted COVID + 9/1. Patients are eligible to be considered for admit to the [facility] when cleared from airborne precautions by acute MD or Infectious disease. Otherwise they will need to be >20 days from their positive test with recovery/improvement in symptoms ( 9/21) or 2 negative tests. Noted COVID isolation removed 9/23.  Noted patient remains on HFNC 25 to 40 L/min today. Patient not yet at a level to be able to tolerate the intensity required of an inpt rehab admit. I will continue to follow,.   Danne Baxter, RN, MSN Rehab Admissions Coordinator [phone] 08/08/2020 12:39 PM

## 2020-09-04 NOTE — Progress Notes (Addendum)
Physical Therapy Treatment Patient Details Name: Tim Day MRN: [id number] DOB: 12/31/1950 Today's Date: 09/04/2020    History of Present Illness 69 year old with hx of recent COVID pneumonia diagnosis (9/7-9/13, discharge on 2L O2), BL COVID induced DVTs, DM2, GERD HTN presenting with worsening SOB. Per chart found to have left pneumothorax and worsening bilateral infiltrates. Intubated 9/19-9/20     PT Comments    Patient progressing slowly towards PT goals. Anxiety seems to be a limiting factor for pt. Requires Mod A for bed mobility and for standing. Limited standing tolerance due to SOB, weakness and anxiety. RR ranged from 25-60 with activity, Sp02 remained >90% on 40L heated HF [place]. Pt with dizziness initially sitting EOB, which resolved. Encouraged pt to sit up 3 times per day at minimum to help improve overall tolerance/endurance. Will continue to follow and progress OOB as able.   Follow Up Recommendations  CIR;Supervision/Assistance - 24 hour     Equipment Recommendations  Other (comment) (defer)    Recommendations for Other Services       Precautions / Restrictions Precautions Precautions: Fall;Other (comment) Precaution Comments: watch 02, watch HR, and RR, heated HF 02 Restrictions Weight Bearing Restrictions: No    Mobility  Bed Mobility Overal bed mobility: Needs Assistance Bed Mobility: Supine to Sit;Sit to Supine     Supine to sit: Mod assist;HOB elevated Sit to supine: Min assist;HOB elevated   General bed mobility comments: Assist to elevate trunk and scoot bottom to EOB; assist with LEs to return to supine.  Transfers Overall transfer level: Needs assistance Equipment used: Rolling walker (2 wheeled) Transfers: Sit to/from Stand Sit to Stand: Mod assist         General transfer comment: Assist to power to standing with cues for hand placement/technique. Unable to stand fully upright. Decreased standing tolerance due to fatigue, SOB,  anxiety. 3/4 DOE.  Ambulation/Gait             General Gait Details: unable today   Stairs             Wheelchair Mobility    Modified Rankin (Stroke Patients Only)       Balance Overall balance assessment: Needs assistance Sitting-balance support: Feet supported;No upper extremity supported Sitting balance-Leahy Scale: Fair Sitting balance - Comments: Supervision for safety.   Standing balance support: During functional activity Standing balance-Leahy Scale: Poor Standing balance comment: Requires UE support and Min-mod A, poor standing tolerance.                            Cognition Arousal/Alertness: Awake/alert Behavior During Therapy: Anxious Overall Cognitive Status: Within Functional Limits for tasks assessed                                 General Comments: Highly anxious regarding mobility.      Exercises General Exercises - Lower Extremity Ankle Circles/Pumps: AROM;Both;10 reps;Supine Quad Sets: AROM;Both;10 reps;Supine    General Comments General comments (skin integrity, edema, etc.): Pt on 40L heated HF [place] with SP02 >90% with activity, RR 25-60. Wife present.      Pertinent Vitals/Pain Pain Assessment: No/denies pain    Home Living                      Prior Function            PT Goals (current  goals can now be found in the care plan section) Progress towards PT goals: Progressing toward goals (slowly)    Frequency    Min 3X/week      PT Plan Current plan remains appropriate    Co-evaluation              AM-PAC PT "6 Clicks" Mobility   Outcome Measure  Help needed turning from your back to your side while in a flat bed without using bedrails?: A Little Help needed moving from lying on your back to sitting on the side of a flat bed without using bedrails?: A Lot Help needed moving to and from a bed to a chair (including a wheelchair)?: A Lot Help needed standing up from a chair  using your arms (e.g., wheelchair or bedside chair)?: A Lot Help needed to walk in hospital room?: A Lot Help needed climbing 3-5 steps with a railing? : Total 6 Click Score: 12    End of Session Equipment Utilized During Treatment: Oxygen (heated HF 40L) Activity Tolerance: Patient limited by fatigue;Other (comment) (anxiety) Patient left: in bed;with call bell/phone within reach;with bed alarm set;with family/visitor present Nurse Communication: Mobility status PT Visit Diagnosis: Other abnormalities of gait and mobility (R26.89);Muscle weakness (generalized) (M62.81);Difficulty in walking, not elsewhere classified (R26.2)     Time: 2229-7989 PT Time Calculation (min) (ACUTE ONLY): 21 min  Charges:  $Therapeutic Activity: 8-22 mins                     Marisa Severin, PT, DPT Acute Rehabilitation Services Pager [phone] Office [phone]       Marguarite Arbour A Sabra Heck 09/04/2020, 12:25 PM

## 2020-09-04 NOTE — Progress Notes (Signed)
Patient had foley catheter dc'd at 1030 Bladder scan 254 at 1244 Urine output 400 at 1558 Bladder scan 405 at 1745 Urine output 225 at 1754 Patient denies any discomfort or urgency at this time.  Will notify urology if any problems arise and monitor Urine output and bladder scan if needed.

## 2020-09-04 NOTE — Progress Notes (Signed)
PROGRESS NOTE    Tim Day  XYI:[id number] DOB: Mar 08, 1951 DOA: 08/19/2020 PCP: Celene Squibb, MD (Confirm with patient/family/NH records and if not entered, this HAS to be entered at [facility] point of entry. "No PCP" if truly none.)   Brief Narrative: (Start on day 1 of progress note - keep it brief and live)  70 year old Caucasian male with history of hypertension, diabetes mellitus, DVT presented to ED with worsening hypoxic respiratory failure and left pneumothorax.  Patient was initially intubated and managed in ICU.  Patient was successfully extubated and patient is still on high flow nasal cannula at 40 L with FiO2 of 50%.  Patient also had urinary retention and Foley catheter was placed by urology.   Assessment & Plan:   Active Problems:   DM2 (diabetes mellitus, type 2) ([place])   Benign essential HTN   Dyslipidemia   Chest tube in place   COVID-19 virus infection   Acute hypoxemic respiratory failure due to COVID-19 [facility])   Pneumothorax, left   Pneumonia due to COVID-19 virus   Leg DVT (deep venous thromboembolism), acute, bilateral (HCC)   Pressure injury of skin   Acute respiratory failure with hypoxia ([place])   HCAP (healthcare-associated pneumonia)   Primary spontaneous pneumothorax   Protein-calorie malnutrition, severe   Acute hypoxic respiratory failure secondary to Covid ARDS and left pneumothorax  present on admission Patient was managed in ICU earlier was intubated.  Patient is still on high flow nasal cannula at the rate of 40 L and FiO2 of 50%.  Plan is to wean down oxygen by maintaining oxygen saturation within normal limits.  Patient also had left-sided pneumothorax and left chest tube was placed that is successfully removed and no evidence of residual pneumothorax.  Patient was treated for Covid pneumonia with Decadron and remdesivir and no contact precautions needed at this time because it is more than 21 days.  Continue to monitor  Left  pneumothorax Improved  Acute urinary retention Urology is following up with patient.  Foley catheter is discontinued today and patient was able to urinate well without any problem.  Continue Flomax.  Acute cystitis Urine culture was positive for enterobacter Aerogens that was susceptible to ceftriaxone and Bactrim.  Ceftriaxone discontinued and patient will be continued on Bactrim.  Bilateral lower extremity DVT Continue Eliquis  Hyponatremia Sodium is 131 today.  Continue to monitor  Hyperkalemia Improved.  Potassium is 4.8  Diabetes mellitus Continue sliding scale insulin.  Blood glucose monitoring and hypoglycemic protocol in place.  Constipation  Continue Colace  DVT prophylaxis: Eliquis Code Status: Full code Family Communication: Family present at bedside Disposition Plan: CIR   Consultants:   Urology  critical care  Procedures: (Don't include imaging studies which can be auto populated. Include things that cannot be auto populated i.e. Echo, Carotid and venous dopplers, Foley, Bipap, HD, tubes/drains, wound vac, central lines etc)  Chest tube placement  Foley catheter  Intubation  Central venous line placement  Antimicrobials: (specify start and planned stop date. Auto populated tables are space occupying and do not give end dates)  Ceftriaxone, Bactrim   Subjective: Patient is seen and evaluated at the bedside.  Patient is still complaining of shortness of breath and remains on high flow oxygen with nasal cannula.  Patient denies fever, chills, chest pain, nausea, vomiting, abdominal pain and dysuria.  Objective: Vitals:   09/04/20 1027 09/04/20 1146 09/04/20 1349 09/04/20 1603  BP:  131/84  128/82  Pulse:  (!) 107  (!)  106  Resp:  (!) 24  (!) 21  Temp:  99.5 F (37.5 C)  97.8 F (36.6 C)  TempSrc:  Axillary  Oral  SpO2: 98% 95% 95% 96%  Weight:      Height:        Intake/Output Summary (Last 24 hours) at 09/04/2020 1853 Last data filed at  09/04/2020 1754 Gross per 24 hour  Intake 340 ml  Output 4025 ml  Net -3685 ml   Filed Weights   08/27/20 0536 08/29/20 0550 09/04/20 0536  Weight: 80 kg 80 kg 78.7 kg    Examination:  General exam: Appears calm and comfortable  Respiratory system: Patient is on oxygen supplementation with high flow nasal cannula.  Coarse and diminished breath sounds in bilateral lung lobes.  Cardiovascular system: S1 & S2 heard, RRR. No JVD, murmurs, rubs, gallops or clicks. No pedal edema. Gastrointestinal system: Abdomen is nondistended, soft and nontender. No organomegaly or masses felt. Normal bowel sounds heard.  Foley catheter in place Central nervous system: Alert and oriented. No focal neurological deficits. Extremities: Symmetric 5 x 5 power. Skin: No rashes, lesions or ulcers Psychiatry: Judgement and insight appear normal. Mood & affect appropriate.     Data Reviewed: I have personally reviewed following labs and imaging studies  CBC: Recent Labs  Lab 08/31/20 0104 09/01/20 0512 09/02/20 1046 09/03/20 0220 09/04/20 0247  WBC 9.2 6.8 6.7 7.8 7.1  HGB 11.0* 11.5* 10.7* 11.6* 10.9*  HCT 33.8* 34.9* 33.2* 35.1* 34.3*  MCV 89.2 89.9 89.7 89.1 89.1  PLT 198 183 167 180 300   Basic Metabolic Panel: Recent Labs  Lab 08/31/20 0104 09/01/20 0512 09/02/20 1635 09/03/20 0220 09/04/20 0247  NA 131* 132* 131* 131* 131*  K 5.1 4.9 5.0 5.0 4.8  CL 88* 88* 92* 91* 93*  CO2 34* 32 32 31 30  GLUCOSE 299* 323* 145* 230* 225*  BUN 25* 20 22 21 23   CREATININE 0.70 0.66 0.52* 0.66 0.68  CALCIUM 8.6* 8.4* 8.3* 8.4* 8.0*   GFR: Estimated Creatinine Clearance: 92.5 mL/min (by C-G formula based on SCr of 0.68 mg/dL). Liver Function Tests: Recent Labs  Lab 08/29/20 0229  AST 24  ALT 46*  ALKPHOS 66  BILITOT 0.5  PROT 5.4*  ALBUMIN 2.1*   No results for input(s): LIPASE, AMYLASE in the last 168 hours. No results for input(s): AMMONIA in the last 168 hours. Coagulation  Profile: No results for input(s): INR, PROTIME in the last 168 hours. Cardiac Enzymes: No results for input(s): CKTOTAL, CKMB, CKMBINDEX, TROPONINI in the last 168 hours. BNP (last 3 results) No results for input(s): PROBNP in the last 8760 hours. HbA1C: No results for input(s): HGBA1C in the last 72 hours. CBG: Recent Labs  Lab 09/03/20 1701 09/03/20 2206 09/04/20 0806 09/04/20 1140 09/04/20 1601  GLUCAP 228* 229* 349* 226* 214*   Lipid Profile: No results for input(s): CHOL, HDL, LDLCALC, TRIG, CHOLHDL, LDLDIRECT in the last 72 hours. Thyroid Function Tests: No results for input(s): TSH, T4TOTAL, FREET4, T3FREE, THYROIDAB in the last 72 hours. Anemia Panel: No results for input(s): VITAMINB12, FOLATE, FERRITIN, TIBC, IRON, RETICCTPCT in the last 72 hours. Sepsis Labs: No results for input(s): PROCALCITON, LATICACIDVEN in the last 168 hours.  Recent Results (from the past 240 hour(s))  Culture, Urine     Status: Abnormal   Collection Time: 08/31/20  5:35 PM   Specimen: Urine, Catheterized  Result Value Ref Range Status   Specimen Description URINE, CATHETERIZED  Final  Special Requests   Final    NONE Performed at [facility], [place] [address], [place], [place] [address]    Culture >=100,000 COLONIES/mL ENTEROBACTER AEROGENES (A)  Final   Report Status 09/02/2020 FINAL  Final   Organism ID, Bacteria ENTEROBACTER AEROGENES (A)  Final      Susceptibility   Enterobacter aerogenes - MIC*    CEFAZOLIN RESISTANT Resistant     CEFTRIAXONE <=0.25 SENSITIVE Sensitive     CIPROFLOXACIN <=0.25 SENSITIVE Sensitive     GENTAMICIN <=1 SENSITIVE Sensitive     IMIPENEM 2 SENSITIVE Sensitive     NITROFURANTOIN 32 SENSITIVE Sensitive     TRIMETH/SULFA <=20 SENSITIVE Sensitive     PIP/TAZO <=4 SENSITIVE Sensitive     * >=100,000 COLONIES/mL ENTEROBACTER AEROGENES         Radiology Studies: No results found.      Scheduled Meds: . albuterol  2 puff Inhalation Q6H   . apixaban  5 mg Oral BID  . chlorhexidine  15 mL Mouth Rinse BID  . Chlorhexidine Gluconate Cloth  6 each Topical Daily  . docusate  100 mg Oral BID  . feeding supplement (ENSURE ENLIVE)  237 mL Oral QID  . insulin aspart  0-15 Units Subcutaneous TID WC  . insulin aspart  0-5 Units Subcutaneous QHS  . insulin aspart  12 Units Subcutaneous TID WC  . insulin glargine  22 Units Subcutaneous BID  . linagliptin  5 mg Oral Daily  . mouth rinse  15 mL Mouth Rinse q12n4p  . pantoprazole sodium  40 mg Per Tube QHS  . polyethylene glycol  17 g Oral BID  . sulfamethoxazole-trimethoprim  1 tablet Oral Q12H  . tamsulosin  0.4 mg Oral Daily   Continuous Infusions: . sodium chloride       LOS: 16 days    Time spent:     Edmonia Lynch, MD [facility] Pager [phone]  If 7PM-7AM, please contact night-coverage [email or web address] Password  09/04/2020, 6:53 PM

## 2020-09-04 NOTE — Progress Notes (Signed)
  Subjective: Pain controlled. No nausea or emesis. Tolerating foley. Not ambulating much yet.  Objective: Vital signs in last 24 hours: Temp:  [97.9 F (36.6 C)-98.9 F (37.2 C)] 98.9 F (37.2 C) (10/04 0536) Pulse Rate:  [96-109] 101 (10/04 0536) Resp:  [23-46] 23 (10/04 0536) BP: (113-126)/(74-76) 126/75 (10/04 0536) SpO2:  [92 %-99 %] 94 % (10/04 0536) FiO2 (%):  [40 %-60 %] 50 % (10/04 0439) Weight:  [78.7 kg] 78.7 kg (10/04 0536)  Intake/Output from previous day: 10/03 0701 - 10/04 0700 In: 680 [P.O.:680] Out: 2800 [Urine:2800] Intake/Output this shift: No intake/output data recorded.  Physical Exam:  General: Alert and oriented CV: RRR Lungs: Clear Abdomen: Soft, ND, lower midline scar, no hernias GU: hypospadiac urethra with subglanular meatus Ext: NT, No erythema  Lab Results: Recent Labs    09/02/20 1046 09/03/20 0220 09/04/20 0247  HGB 10.7* 11.6* 10.9*  HCT 33.2* 35.1* 34.3*   BMET Recent Labs    09/03/20 0220 09/04/20 0247  NA 131* 131*  K 5.0 4.8  CL 91* 93*  CO2 31 30  GLUCOSE 230* 225*  BUN 21 23  CREATININE 0.66 0.68  CALCIUM 8.4* 8.0*     Studies/Results: No results found.  Assessment/Plan: 1. Acute urinary retention/incomplete bladder emptying: 14 French Foley catheter placed per hypospadiac urethra with return of 500 mL urine on 9/30. Continue Flomax. Continue abx for UTI as below. VT today. Discussed with RNs. If needs foley replaced, place through hypospadiac meatus inferior to glans on the ventral aspect. 2.  Check urine culture. Void trial in 3 to 5 days. 3. Acute cystitis: UCx 9/30 with enterobacter aerogenes. Continue ceftriaxone per primary.   LOS: 16 days   Tim Day 09/04/2020, 8:00 AM Matt R. [facility]  Pager: [phone]

## 2020-09-05 DIAGNOSIS — J9601 Acute respiratory failure with hypoxia: Secondary | ICD-10-CM | POA: Diagnosis not present

## 2020-09-05 LAB — CBC
HCT: 35.4 % — ABNORMAL LOW (ref 39.0–52.0)
Hemoglobin: 11.3 g/dL — ABNORMAL LOW (ref 13.0–17.0)
MCH: 28.5 pg (ref 26.0–34.0)
MCHC: 31.9 g/dL (ref 30.0–36.0)
MCV: 89.4 fL (ref 80.0–100.0)
Platelets: 174 10*3/uL (ref 150–400)
RBC: 3.96 MIL/uL — ABNORMAL LOW (ref 4.22–5.81)
RDW: 14.9 % (ref 11.5–15.5)
WBC: 8.4 10*3/uL (ref 4.0–10.5)
nRBC: 0.8 % — ABNORMAL HIGH (ref 0.0–0.2)

## 2020-09-05 LAB — GLUCOSE, CAPILLARY
Glucose-Capillary: 248 mg/dL — ABNORMAL HIGH (ref 70–99)
Glucose-Capillary: 317 mg/dL — ABNORMAL HIGH (ref 70–99)
Glucose-Capillary: 236 mg/dL — ABNORMAL HIGH (ref 70–99)
Glucose-Capillary: 159 mg/dL — ABNORMAL HIGH (ref 70–99)

## 2020-09-05 LAB — BASIC METABOLIC PANEL
Anion gap: 9 (ref 5–15)
BUN: 21 mg/dL (ref 8–23)
CO2: 28 mmol/L (ref 22–32)
Calcium: 8.3 mg/dL — ABNORMAL LOW (ref 8.9–10.3)
Chloride: 94 mmol/L — ABNORMAL LOW (ref 98–111)
Creatinine, Ser: 0.54 mg/dL — ABNORMAL LOW (ref 0.61–1.24)
GFR calc Af Amer: >60 mL/min (ref >60)
GFR calc non Af Amer: >60 mL/min (ref >60)
Glucose, Bld: 159 mg/dL — ABNORMAL HIGH (ref 70–99)
Potassium: 5 mmol/L (ref 3.5–5.1)
Sodium: 131 mmol/L — ABNORMAL LOW (ref 135–145)

## 2020-09-05 MED ORDER — INSULIN GLARGINE 100 UNIT/ML ~~LOC~~ SOLN
25.0000 [IU] | Freq: Two times a day (BID) | SUBCUTANEOUS | Status: DC
  Administered 2020-09-06 – 2020-09-18 (×25): 25 [IU] via SUBCUTANEOUS
  Filled 2020-09-05 (×27): qty 0.25

## 2020-09-05 MED ORDER — ENSURE ENLIVE PO LIQD
237.0000 mL | Freq: Three times a day (TID) | ORAL | Status: DC
  Administered 2020-09-05 – 2020-09-28 (×63): 237 mL via ORAL

## 2020-09-05 NOTE — Progress Notes (Signed)
Notified Dr. Humphrey Rolls of bladder scan 437 mls. Will continue to assess patient. Output improving and patient instructed to notify nursing if any discomfort in pelvic area or he feels like he needs to urinate and cannot. Plan reviewed with patient and wife.

## 2020-09-05 NOTE — Therapy (Signed)
Called to take pt off Bipap per pt request. I tried to urge the patient to keep it on but he refused. Pt placed on heated HFNC with NRB mask see flow sheet. Will continue to monitor.

## 2020-09-05 NOTE — Progress Notes (Signed)
Inpatient Diabetes Program Recommendations  AACE/ADA: New Consensus Statement on Inpatient Glycemic Control (2015)  Target Ranges:  Prepandial:   less than 140 mg/dL      Peak postprandial:   less than 180 mg/dL (1-2 hours)      Critically ill patients:  140 - 180 mg/dL   Lab Results  Component Value Date   GLUCAP 317 (H) 09/05/2020   HGBA1C 7.2 (H) 08/08/2020    Review of Glycemic Control Results for CEFERINO, LANG (MRN [id number]) as of 09/05/2020 12:56  Ref. Range 09/04/2020 08:06 09/04/2020 11:40 09/04/2020 16:01 09/04/2020 21:13 09/05/2020 07:46 09/05/2020 11:39  Glucose-Capillary Latest Ref Range: 70 - 99 mg/dL 349 (H) 226 (H) 214 (H) 150 (H) 248 (H) 317 (H)   Diabetes history: DM 2 Outpatient Diabetes medications: Metformin 1000 mg bid, Glipizide 5 mg bid, Trulicity 1.5 mg Weekly Current orders for Inpatient glycemic control:  Lantus 22 units bid Novolog 0-15 units tid + 0-5 qhs Novolog 12 unit tid meal coverage Tradjenta 5 mg Daily   Inpatient Diabetes Program Recommendations:    Lantus 25 units bid  Will continue to follow while inpatient.  Thank you, Reche Dixon, RN, BSN Diabetes Coordinator Inpatient Diabetes Program [phone] (team pager from 8a-5p)

## 2020-09-05 NOTE — Progress Notes (Signed)
RT placed patient on BIPAP. Patient very anxious and short of breath. Patient states it is helping right now.

## 2020-09-05 NOTE — Progress Notes (Signed)
PROGRESS NOTE    Tim Day  WJX:[id number] DOB: 03-Jun-1951 DOA: 08/19/2020 PCP: Celene Squibb, MD (Confirm with patient/family/NH records and if not entered, this HAS to be entered at [facility] point of entry. "No PCP" if truly none.)   Brief Narrative: (Start on day 1 of progress note - keep it brief and live)  69 year old Caucasian male with history of hypertension, diabetes mellitus, DVT presented to ED with worsening hypoxic respiratory failure and left pneumothorax.  Patient was initially intubated and managed in ICU.  Patient was successfully extubated and patient is still on high flow nasal cannula at 40 L with FiO2 of 50%.  Patient also had urinary retention and Foley catheter was placed by urology.  Foley catheter is removed and patient is voiding well on his own with Flomax.  Assessment & Plan:  Principal problem Acute hypoxic respiratory failure secondary to Covid ARDS and left pneumothorax.   Active Problems:   DM2 (diabetes mellitus, type 2) ([place])   Benign essential HTN   Dyslipidemia   Chest tube in place   COVID-19 virus infection   Acute hypoxemic respiratory failure due to COVID-19 [facility])   Pneumothorax, left   Pneumonia due to COVID-19 virus   Leg DVT (deep venous thromboembolism), acute, bilateral (HCC)   Pressure injury of skin   Acute respiratory failure with hypoxia ([place])   HCAP (healthcare-associated pneumonia)   Primary spontaneous pneumothorax   Protein-calorie malnutrition, severe   Acute hypoxic respiratory failure secondary to Covid ARDS and left pneumothorax  present on admission Patient was managed in ICU earlier was intubated.  Patient is still on high flow nasal cannula at the rate of 40 L and FiO2 of 50%.  BiPAP during the night. Plan is to wean down oxygen by maintaining oxygen saturation within normal limits.  Patient also had left-sided pneumothorax and left chest tube was placed that is successfully removed and no evidence of residual  pneumothorax.  Patient was treated for Covid pneumonia with Decadron and remdesivir and no contact precautions needed at this time because it is more than 21 days.  Continue to monitor  Left pneumothorax Improved after chest tube.  Acute urinary retention Urology is following up with patient.  Foley catheter is discontinued today and patient was able to urinate well without any problem.  Continue Flomax.  Acute cystitis Urine culture was positive for enterobacter Aerogens that was susceptible to ceftriaxone and Bactrim.  Ceftriaxone discontinued and patient will be continued on Bactrim.  Bilateral lower extremity DVT Continue Eliquis  Anxiety Continue Ativan as needed twice daily  Hyponatremia Sodium is 131 today.  Continue to monitor  Hyperkalemia Resolved.  Continue to monitor  Diabetes mellitus Continue sliding scale insulin.  Lantus is increased to 25 units twice daily. Blood glucose monitoring and hypoglycemic protocol in place.  Constipation  Continue Colace   DVT prophylaxis: Eliquis Code Status: Full code Family Communication: Patient's wife present at the bedside Disposition Plan: PT/OT recommended CIR   Consultants:   Urology  Critical care  Procedures: (Don't include imaging studies which can be auto populated. Include things that cannot be auto populated i.e. Echo, Carotid and venous dopplers, Foley, Bipap, HD, tubes/drains, wound vac, central lines etc)  Intubation  Chest tube placement  Antimicrobials: (specify start and planned stop date. Auto populated tables are space occupying and do not give end dates)  Ceftriaxone, Bactrim   Subjective: Patient is seen and evaluated at the bedside this morning.  Patient is complaining of lethargy and  weakness.  Overnight patient used BiPAP.  Patient is still on high flow nasal cannula 40 L at FiO2 of 50%.  Patient denies fever, chills, chest pain, nausea, vomiting, abdominal pain and  dysuria.  Objective: Vitals:   09/05/20 0600 09/05/20 0748 09/05/20 0846 09/05/20 1413  BP: 128/81 116/74    Pulse: (!) 102 (!) 107    Resp: (!) 27 (!) 25    Temp: 98.8 F (37.1 C) (!) 97.5 F (36.4 C)    TempSrc: Oral Axillary    SpO2: 97% 97% 96% 98%  Weight:      Height:        Intake/Output Summary (Last 24 hours) at 09/05/2020 1856 Last data filed at 09/05/2020 0749 Gross per 24 hour  Intake --  Output 950 ml  Net -950 ml   Filed Weights   08/27/20 0536 08/29/20 0550 09/04/20 0536  Weight: 80 kg 80 kg 78.7 kg    Examination:  General exam: Appears calm and comfortable  Respiratory system: Patient is on high flow oxygen supplementation with nasal cannula.  Coarse and diminished breath sounds and bilateral lower lobes with mild wheezing. Cardiovascular system: S1 & S2 heard, RRR. No JVD, murmurs, rubs, gallops or clicks. No pedal edema. Gastrointestinal system: Abdomen is nondistended, soft and nontender. No organomegaly or masses felt. Normal bowel sounds heard. Central nervous system: Alert and oriented. No focal neurological deficits. Extremities: Symmetric  Skin: No rashes, lesions or ulcers Psychiatry: Judgement and insight appear normal. Mood & affect appropriate.     Data Reviewed: I have personally reviewed following labs and imaging studies  CBC: Recent Labs  Lab 09/01/20 0512 09/02/20 1046 09/03/20 0220 09/04/20 0247 09/05/20 0321  WBC 6.8 6.7 7.8 7.1 8.4  HGB 11.5* 10.7* 11.6* 10.9* 11.3*  HCT 34.9* 33.2* 35.1* 34.3* 35.4*  MCV 89.9 89.7 89.1 89.1 89.4  PLT 183 167 180 165 182   Basic Metabolic Panel: Recent Labs  Lab 09/01/20 0512 09/02/20 1635 09/03/20 0220 09/04/20 0247 09/05/20 0321  NA 132* 131* 131* 131* 131*  K 4.9 5.0 5.0 4.8 5.0  CL 88* 92* 91* 93* 94*  CO2 32 32 31 30 28   GLUCOSE 323* 145* 230* 225* 159*  BUN 20 22 21 23 21   CREATININE 0.66 0.52* 0.66 0.68 0.54*  CALCIUM 8.4* 8.3* 8.4* 8.0* 8.3*   GFR: Estimated  Creatinine Clearance: 92.5 mL/min (A) (by C-G formula based on SCr of 0.54 mg/dL (L)). Liver Function Tests: No results for input(s): AST, ALT, ALKPHOS, BILITOT, PROT, ALBUMIN in the last 168 hours. No results for input(s): LIPASE, AMYLASE in the last 168 hours. No results for input(s): AMMONIA in the last 168 hours. Coagulation Profile: No results for input(s): INR, PROTIME in the last 168 hours. Cardiac Enzymes: No results for input(s): CKTOTAL, CKMB, CKMBINDEX, TROPONINI in the last 168 hours. BNP (last 3 results) No results for input(s): PROBNP in the last 8760 hours. HbA1C: No results for input(s): HGBA1C in the last 72 hours. CBG: Recent Labs  Lab 09/04/20 1601 09/04/20 2113 09/05/20 0746 09/05/20 1139 09/05/20 1613  GLUCAP 214* 150* 248* 317* 236*   Lipid Profile: No results for input(s): CHOL, HDL, LDLCALC, TRIG, CHOLHDL, LDLDIRECT in the last 72 hours. Thyroid Function Tests: No results for input(s): TSH, T4TOTAL, FREET4, T3FREE, THYROIDAB in the last 72 hours. Anemia Panel: No results for input(s): VITAMINB12, FOLATE, FERRITIN, TIBC, IRON, RETICCTPCT in the last 72 hours. Sepsis Labs: No results for input(s): PROCALCITON, LATICACIDVEN in the last 168 hours.  Recent Results (from the past 240 hour(s))  Culture, Urine     Status: Abnormal   Collection Time: 08/31/20  5:35 PM   Specimen: Urine, Catheterized  Result Value Ref Range Status   Specimen Description URINE, CATHETERIZED  Final   Special Requests   Final    NONE Performed at [facility], [address] [place], [place] [address]    Culture >=100,000 COLONIES/mL ENTEROBACTER AEROGENES (A)  Final   Report Status 09/02/2020 FINAL  Final   Organism ID, Bacteria ENTEROBACTER AEROGENES (A)  Final      Susceptibility   Enterobacter aerogenes - MIC*    CEFAZOLIN RESISTANT Resistant     CEFTRIAXONE <=0.25 SENSITIVE Sensitive     CIPROFLOXACIN <=0.25 SENSITIVE Sensitive     GENTAMICIN <=1 SENSITIVE  Sensitive     IMIPENEM 2 SENSITIVE Sensitive     NITROFURANTOIN 32 SENSITIVE Sensitive     TRIMETH/SULFA <=20 SENSITIVE Sensitive     PIP/TAZO <=4 SENSITIVE Sensitive     * >=100,000 COLONIES/mL ENTEROBACTER AEROGENES         Radiology Studies: No results found.      Scheduled Meds: . albuterol  2 puff Inhalation Q6H  . apixaban  5 mg Oral BID  . chlorhexidine  15 mL Mouth Rinse BID  . docusate  100 mg Oral BID  . feeding supplement (ENSURE ENLIVE)  237 mL Oral TID BM  . insulin aspart  0-15 Units Subcutaneous TID WC  . insulin aspart  0-5 Units Subcutaneous QHS  . insulin aspart  12 Units Subcutaneous TID WC  . insulin glargine  25 Units Subcutaneous BID  . linagliptin  5 mg Oral Daily  . mouth rinse  15 mL Mouth Rinse q12n4p  . pantoprazole sodium  40 mg Per Tube QHS  . polyethylene glycol  17 g Oral BID  . sulfamethoxazole-trimethoprim  1 tablet Oral Q12H  . tamsulosin  0.4 mg Oral Daily   Continuous Infusions: . sodium chloride       LOS: 17 days        Edmonia Lynch, MD [facility] Pager [phone]  If 7PM-7AM, please contact night-coverage [email or web address] Password  09/05/2020, 6:56 PM

## 2020-09-05 NOTE — Progress Notes (Signed)
Nutrition Follow-up  DOCUMENTATION CODES:   Severe malnutrition in context of acute illness/injury  INTERVENTION:    Ensure Enlive po TID, each supplement provides 350 kcal and 20 grams of protein   Continue Magic cup TID with meals, each supplement provides 290 kcal and 9 grams of protein   NUTRITION DIAGNOSIS:   Severe Malnutrition related to acute illness (COVID19) as evidenced by mild fat depletion, mild muscle depletion, moderate muscle depletion, severe muscle depletion, moderate fat depletion, percent weight loss (13.3% weight loss in less than 1 month).  Being addressed via po intake, supplements  GOAL:   Patient will meet greater than or equal to 90% of their needs  Progressing well  MONITOR:   TF tolerance, PO intake, Labs, Skin  REASON FOR ASSESSMENT:   Consult, Ventilator Enteral/tube feeding initiation and management  ASSESSMENT:   69 yo male admitted with recent COVID-19 diagnosis with worsening respiratory failure and left pneumothorax.  PMH includes DM, HTN, GERD.  9/01 - treated for COVID-19 9/18 - admitted to [facility], chest tube placed, transferred to [facility] 9/19 - intubated, left pneumothorax, second chest tube placed 9/20 - extubated 9/21 - chest tubes obstructed, new chest tube inserted 9/24 - cortrak inserted 9/28 - chest tube removed 92/9 - cortrak removed  Pt remains on HFNC, 40 L/min, 50% FiO2  Recorded po intake 75-100% of meals, appetite remains good  Current wt 78.7 kg; weight relatively stable  Labs: sodium 131 (L), CBGs 150-349  Meds: colace, ss novolog, novolog with meals, lantus, tradjenta  Diet Order:   Diet Order            Diet Carb Modified Fluid consistency: Thin; Room service appropriate? Yes  Diet effective now                 EDUCATION NEEDS:   Not appropriate for education at this time  Skin:  Skin Assessment: Skin Integrity Issues: Skin Integrity Issues:: Stage II Stage I: N/A Stage II: bilateral  buttocks  Last BM:  10/3  Height:   Ht Readings from Last 1 Encounters:  08/19/20 5\' 10"  (1.778 m)    Weight:   Wt Readings from Last 1 Encounters:  09/04/20 78.7 kg    Ideal Body Weight:     BMI:  Body mass index is 24.88 kg/m.  Estimated Nutritional Needs:   Kcal:  2200-2400  Protein:  110-130 gm  Fluid:  2-2.2 L   Kerman Passey MS, RDN, LDN, CNSC Registered Dietitian III Clinical Nutrition RD Pager and On-Call Pager Number Located in [place]

## 2020-09-06 DIAGNOSIS — J9601 Acute respiratory failure with hypoxia: Secondary | ICD-10-CM | POA: Diagnosis not present

## 2020-09-06 LAB — BASIC METABOLIC PANEL
Anion gap: 8 (ref 5–15)
BUN: 18 mg/dL (ref 8–23)
CO2: 27 mmol/L (ref 22–32)
Calcium: 8.1 mg/dL — ABNORMAL LOW (ref 8.9–10.3)
Chloride: 96 mmol/L — ABNORMAL LOW (ref 98–111)
Creatinine, Ser: 0.64 mg/dL (ref 0.61–1.24)
GFR calc non Af Amer: >60 mL/min (ref >60)
Glucose, Bld: 183 mg/dL — ABNORMAL HIGH (ref 70–99)
Potassium: 5.1 mmol/L (ref 3.5–5.1)
Sodium: 131 mmol/L — ABNORMAL LOW (ref 135–145)

## 2020-09-06 LAB — CBC
HCT: 36.9 % — ABNORMAL LOW (ref 39.0–52.0)
Hemoglobin: 11.8 g/dL — ABNORMAL LOW (ref 13.0–17.0)
MCH: 29.4 pg (ref 26.0–34.0)
MCHC: 32 g/dL (ref 30.0–36.0)
MCV: 92 fL (ref 80.0–100.0)
Platelets: 185 10*3/uL (ref 150–400)
RBC: 4.01 MIL/uL — ABNORMAL LOW (ref 4.22–5.81)
RDW: 15.4 % (ref 11.5–15.5)
WBC: 7.6 10*3/uL (ref 4.0–10.5)
nRBC: 0.7 % — ABNORMAL HIGH (ref 0.0–0.2)

## 2020-09-06 LAB — GLUCOSE, CAPILLARY
Glucose-Capillary: 252 mg/dL — ABNORMAL HIGH (ref 70–99)
Glucose-Capillary: 265 mg/dL — ABNORMAL HIGH (ref 70–99)
Glucose-Capillary: 155 mg/dL — ABNORMAL HIGH (ref 70–99)
Glucose-Capillary: 243 mg/dL — ABNORMAL HIGH (ref 70–99)

## 2020-09-06 MED ORDER — CHLORHEXIDINE GLUCONATE CLOTH 2 % EX PADS
6.0000 | MEDICATED_PAD | Freq: Every day | CUTANEOUS | Status: DC
  Administered 2020-09-06 – 2020-09-28 (×23): 6 via TOPICAL

## 2020-09-06 NOTE — Progress Notes (Signed)
Physical Therapy Treatment Patient Details Name: Tim Day MRN: [id number] DOB: 1951-03-01 Today's Date: 09/06/2020    History of Present Illness 69 year old with hx of recent COVID pneumonia diagnosis (9/7-9/13, discharge on 2L O2), BL COVID induced DVTs, DM2, GERD HTN presenting with worsening SOB. Per chart found to have left pneumothorax and worsening bilateral infiltrates. Intubated 9/19-9/20     PT Comments    Pt supine in bed SPO2 stable on supplemental O2.  Heated HF 20L [place] & 15L [place].  Pt maintained SPO2 90% or greater throughout.  RR elevated to 51 at it's highest reading.  Ranged from 39-51 with activity.  RR limited progression of mobility.     Follow Up Recommendations  CIR;Supervision/Assistance - 24 hour     Equipment Recommendations  Other (comment) (defer)    Recommendations for Other Services       Precautions / Restrictions Precautions Precautions: Fall;Other (comment) Precaution Comments: watch 02, watch HR, and RR, heated HF 02 Restrictions Weight Bearing Restrictions: No    Mobility  Bed Mobility Overal bed mobility: Needs Assistance Bed Mobility: Supine to Sit;Sit to Supine Rolling: Mod assist;+2 for physical assistance   Supine to sit: Mod assist;HOB elevated Sit to supine: HOB elevated;Mod assist   General bed mobility comments: Assist to elevate trunk and scoot bottom to EOB; assist with LEs to return to supine.  Transfers                 General transfer comment: Pt refused to stand due to anxiety, also RR too high due to anxiety and increased WOB.  Ambulation/Gait                 Stairs             Wheelchair Mobility    Modified Rankin (Stroke Patients Only)       Balance Overall balance assessment: Needs assistance Sitting-balance support: Feet supported;No upper extremity supported Sitting balance-Leahy Scale: Fair       Standing balance-Leahy Scale: Poor Standing balance comment: Requires  UE support and Min-mod A, poor standing tolerance.                            Cognition Arousal/Alertness: Awake/alert Behavior During Therapy: Anxious Overall Cognitive Status: Within Functional Limits for tasks assessed                                 General Comments: Highly anxious regarding mobility.      Exercises General Exercises - Lower Extremity Ankle Circles/Pumps: AROM;Both;10 reps;Supine Quad Sets: AROM;Both;10 reps;Supine Short Arc Quad: AAROM;Both;5 reps;Supine Heel Slides: AAROM;Both;10 reps;Supine    General Comments        Pertinent Vitals/Pain Pain Assessment: No/denies pain Faces Pain Scale: Hurts a little bit Pain Location: back Pain Descriptors / Indicators: Discomfort;Aching Pain Intervention(s): Monitored during session;Repositioned    Home Living                      Prior Function            PT Goals (current goals can now be found in the care plan section) Acute Rehab PT Goals Patient Stated Goal: get better, get home when able Potential to Achieve Goals: Good Progress towards PT goals: Progressing toward goals    Frequency    Min 3X/week      PT  Plan Current plan remains appropriate    Co-evaluation              AM-PAC PT "6 Clicks" Mobility   Outcome Measure  Help needed turning from your back to your side while in a flat bed without using bedrails?: A Little Help needed moving from lying on your back to sitting on the side of a flat bed without using bedrails?: A Lot Help needed moving to and from a bed to a chair (including a wheelchair)?: Total Help needed standing up from a chair using your arms (e.g., wheelchair or bedside chair)?: Total Help needed to walk in hospital room?: Total Help needed climbing 3-5 steps with a railing? : Total 6 Click Score: 9    End of Session Equipment Utilized During Treatment: Oxygen (heated HFNC  20L, 15L NRB mask on top of Heated HF [place]) Activity  Tolerance: Patient limited by fatigue;Other (comment) (increased RR) Patient left: in bed;with call bell/phone within reach;with bed alarm set;with family/visitor present Nurse Communication: Mobility status PT Visit Diagnosis: Other abnormalities of gait and mobility (R26.89);Muscle weakness (generalized) (M62.81);Difficulty in walking, not elsewhere classified (R26.2)     Time: 4098-1191 PT Time Calculation (min) (ACUTE ONLY): 21 min  Charges:  $Therapeutic Activity: 8-22 mins                     Tim Day , PTA Acute Rehabilitation Services Pager [phone] Office [phone]     Tim Day Tim Day 09/06/2020, 5:21 PM

## 2020-09-06 NOTE — Progress Notes (Signed)
OT Cancellation Note  Patient Details Name: Tim Day MRN: [id number] DOB: Jul 22, 1951   Cancelled Treatment:    Reason Eval/Treat Not Completed: Other (comment) Patient on BIPAP at this time. RN placed call to respiratory to place patient on HFNC. OT will check back as time allows.   [name] OTR/L Supplemental OT, Department of rehab services [phone]  [name] R H. 09/06/2020, 2:19 PM

## 2020-09-06 NOTE — Progress Notes (Signed)
PROGRESS NOTE    Tim Day  OVZ:[id number] DOB: 09/22/1951 DOA: 08/19/2020 PCP: Celene Squibb, MD (Confirm with patient/family/NH records and if not entered, this HAS to be entered at [facility] point of entry. "No PCP" if truly none.)   Brief Narrative: (Start on day 1 of progress note - keep it brief and live)  69 year old Caucasian male with history of hypertension, diabetes mellitus, DVT presented to ED with worsening hypoxic respiratory failure and left pneumothorax. Patient was initially intubated and managed in ICU. Patient was successfully extubated and patient is still on high flow nasal cannula at 40 L with FiO2 of 50%. Patient also had urinary retention and Foley catheter was placed by urology.  Foley catheter was removed and patient was voiding well on his own with Flomax.  Patient is still on high flow oxygen nasal cannula at the rate of 20 liters with FiO2 of 50%.  Patient is also having difficulty in voiding and bladder scan showed more than 600 mL of urine.  Urology recommended to replace the Foley catheter.  Assessment & Plan:   Active Problems:   DM2 (diabetes mellitus, type 2) ([place])   Benign essential HTN   Dyslipidemia   Chest tube in place   COVID-19 virus infection   Acute hypoxemic respiratory failure due to COVID-19 [facility])   Pneumothorax, left   Pneumonia due to COVID-19 virus   Leg DVT (deep venous thromboembolism), acute, bilateral ([facility])   Pressure injury of skin   Acute respiratory failure with hypoxia ([place])   HCAP (healthcare-associated pneumonia)   Primary spontaneous pneumothorax   Protein-calorie malnutrition, severe  Acute hypoxic respiratory failure secondary to Covid ARDS and left pneumothorax present on admission Patient was managed in ICU earlier was intubated. Patient is still on high flow nasal cannula at the rate of 20 L and FiO2 of 50%.  BiPAP during the night.Plan is to wean down oxygen by maintaining oxygen saturation within normal  limits. Patient also had left-sided pneumothorax and left chest tube was placed that is successfully removed and no evidence of residual pneumothorax. Patient was treated for Covid pneumonia with Decadron and remdesivir and no contact precautions needed at this time because it is more than 21 days. Continue to monitor.  The plan is to discharge to inpatient rehab but cannot be discharged with high requirement of oxygen at this time.  Left pneumothorax Improved after chest tube.  Acute urinary retention Urology is following up with patient. Foley catheter was discontinued 2 days ago and patient was able to urinate well without any problem with Flomax.  Today again patient had problem with voiding and bladder scan showed 600 mL of urine.  Urology recommended to replace the Foley catheter.  Acute cystitis Patient denies fever, chills, dysuria, burning micturition and hematuria. Urine culture was positive for enterobacter Aerogensthat was susceptible to ceftriaxone and Bactrim. Ceftriaxone was discontinued earlier after the culture results and and completed his course with Bactrim.  Bilateral lower extremity DVT Continue Eliquis  Anxiety Continue Ativan as needed twice daily  Hyponatremia Sodium is 131 today. Continue to monitor  Hyperkalemia Resolved.  Continue to monitor  Diabetes mellitus Continue sliding scale insulin.  Lantus is increased to 25 units twice daily.Blood glucose monitoring and hypoglycemicprotocol inplace.  Constipation Continue Colace   DVT prophylaxis: Eliquis Code Status: Full code Family Communication: Patient's wife present at the bedside Disposition Plan: PT/OT recommended inpatient rehab   Consultants:   Urology  Critical care  Procedures: (Don't include imaging studies  which can be auto populated. Include things that cannot be auto populated i.e. Echo, Carotid and venous dopplers, Foley, Bipap, HD, tubes/drains, wound vac, central  lines etc)  Intubation  Chest tube placement  Antimicrobials: (specify start and planned stop date. Auto populated tables are space occupying and do not give end dates)  Ceftriaxone, Bactrim   Subjective: Patient is seen and evaluated at the bedside this morning.  His wife is present at the bedside. Patient is still complaining of lethargy and weakness.  Overnight patient used BiPAP.  Patient is still on high flow nasal cannula 20 L at FiO2 of 50%.  Patient denies fever, chills, chest pain, nausea, vomiting, abdominal pain and dysuria.  Objective: Vitals:   09/06/20 0808 09/06/20 0834 09/06/20 1146 09/06/20 1332  BP:  114/77 117/70   Pulse: (!) 102 (!) 101 (!) 111   Resp: (!) 39 (!) 34 (!) 28   Temp:  97.9 F (36.6 C) 97.9 F (36.6 C)   TempSrc:  Oral Axillary   SpO2: 99% 97% 98% 96%  Weight:      Height:        Intake/Output Summary (Last 24 hours) at 09/06/2020 1542 Last data filed at 09/06/2020 0900 Gross per 24 hour  Intake 462 ml  Output 1475 ml  Net -1013 ml   Filed Weights   08/27/20 0536 08/29/20 0550 09/04/20 0536  Weight: 80 kg 80 kg 78.7 kg    Examination:  General exam: Appears calm and comfortable  Respiratory system: Patient is on high flow oxygen supplementation with nasal cannula.  Coarse and diminished breath sounds and bilateral lower lobes with mild wheezing. Cardiovascular system: S1 & S2 heard, RRR. No JVD, murmurs, rubs, gallops or clicks. No pedal edema. Gastrointestinal system: Abdomen is nondistended, soft and nontender. No organomegaly or masses felt. Normal bowel sounds heard. Central nervous system: Alert and oriented. No focal neurological deficits. Extremities: Symmetric  Skin: No rashes, lesions or ulcers Psychiatry: Judgement and insight appear normal. Mood & affect appropriate    Data Reviewed: I have personally reviewed following labs and imaging studies  CBC: Recent Labs  Lab 09/02/20 1046 09/03/20 0220 09/04/20 0247  09/05/20 0321 09/06/20 0202  WBC 6.7 7.8 7.1 8.4 7.6  HGB 10.7* 11.6* 10.9* 11.3* 11.8*  HCT 33.2* 35.1* 34.3* 35.4* 36.9*  MCV 89.7 89.1 89.1 89.4 92.0  PLT 167 180 165 174 119   Basic Metabolic Panel: Recent Labs  Lab 09/02/20 1635 09/03/20 0220 09/04/20 0247 09/05/20 0321 09/06/20 0202  NA 131* 131* 131* 131* 131*  K 5.0 5.0 4.8 5.0 5.1  CL 92* 91* 93* 94* 96*  CO2 32 31 30 28 27   GLUCOSE 145* 230* 225* 159* 183*  BUN 22 21 23 21 18   CREATININE 0.52* 0.66 0.68 0.54* 0.64  CALCIUM 8.3* 8.4* 8.0* 8.3* 8.1*   GFR: Estimated Creatinine Clearance: 92.5 mL/min (by C-G formula based on SCr of 0.64 mg/dL). Liver Function Tests: No results for input(s): AST, ALT, ALKPHOS, BILITOT, PROT, ALBUMIN in the last 168 hours. No results for input(s): LIPASE, AMYLASE in the last 168 hours. No results for input(s): AMMONIA in the last 168 hours. Coagulation Profile: No results for input(s): INR, PROTIME in the last 168 hours. Cardiac Enzymes: No results for input(s): CKTOTAL, CKMB, CKMBINDEX, TROPONINI in the last 168 hours. BNP (last 3 results) No results for input(s): PROBNP in the last 8760 hours. HbA1C: No results for input(s): HGBA1C in the last 72 hours. CBG: Recent Labs  Lab  09/05/20 1139 09/05/20 1613 09/05/20 2125 09/06/20 0828 09/06/20 1140  GLUCAP 317* 236* 159* 252* 265*   Lipid Profile: No results for input(s): CHOL, HDL, LDLCALC, TRIG, CHOLHDL, LDLDIRECT in the last 72 hours. Thyroid Function Tests: No results for input(s): TSH, T4TOTAL, FREET4, T3FREE, THYROIDAB in the last 72 hours. Anemia Panel: No results for input(s): VITAMINB12, FOLATE, FERRITIN, TIBC, IRON, RETICCTPCT in the last 72 hours. Sepsis Labs: No results for input(s): PROCALCITON, LATICACIDVEN in the last 168 hours.  Recent Results (from the past 240 hour(s))  Culture, Urine     Status: Abnormal   Collection Time: 08/31/20  5:35 PM   Specimen: Urine, Catheterized  Result Value Ref Range  Status   Specimen Description URINE, CATHETERIZED  Final   Special Requests   Final    NONE Performed at [facility], [address], [place], [place] [address]    Culture >=100,000 COLONIES/mL ENTEROBACTER AEROGENES (A)  Final   Report Status 09/02/2020 FINAL  Final   Organism ID, Bacteria ENTEROBACTER AEROGENES (A)  Final      Susceptibility   Enterobacter aerogenes - MIC*    CEFAZOLIN RESISTANT Resistant     CEFTRIAXONE <=0.25 SENSITIVE Sensitive     CIPROFLOXACIN <=0.25 SENSITIVE Sensitive     GENTAMICIN <=1 SENSITIVE Sensitive     IMIPENEM 2 SENSITIVE Sensitive     NITROFURANTOIN 32 SENSITIVE Sensitive     TRIMETH/SULFA <=20 SENSITIVE Sensitive     PIP/TAZO <=4 SENSITIVE Sensitive     * >=100,000 COLONIES/mL ENTEROBACTER AEROGENES         Radiology Studies: No results found.      Scheduled Meds: . albuterol  2 puff Inhalation Q6H  . apixaban  5 mg Oral BID  . chlorhexidine  15 mL Mouth Rinse BID  . Chlorhexidine Gluconate Cloth  6 each Topical Daily  . docusate  100 mg Oral BID  . feeding supplement (ENSURE ENLIVE)  237 mL Oral TID BM  . insulin aspart  0-15 Units Subcutaneous TID WC  . insulin aspart  0-5 Units Subcutaneous QHS  . insulin aspart  12 Units Subcutaneous TID WC  . insulin glargine  25 Units Subcutaneous BID  . linagliptin  5 mg Oral Daily  . mouth rinse  15 mL Mouth Rinse q12n4p  . pantoprazole sodium  40 mg Per Tube QHS  . polyethylene glycol  17 g Oral BID  . sulfamethoxazole-trimethoprim  1 tablet Oral Q12H  . tamsulosin  0.4 mg Oral Daily   Continuous Infusions: . sodium chloride       LOS: 18 days    Time spent:     Edmonia Lynch, MD [facility] Pager [phone]  If 7PM-7AM, please contact night-coverage [email or web address] Password [facility] 09/06/2020, 3:42 PM

## 2020-09-06 NOTE — Progress Notes (Signed)
Paged by nursing staff that pt with elevated bladder scans to 65ml. Pt still not mobilizing well. Recommend foley replacement until able to mobilize better. Foley replaced by nursing staff.  [facility]  Pager: [phone]

## 2020-09-06 NOTE — Progress Notes (Signed)
Spoke with Dr. Abner Greenspan, Urology, regarding bladder scan results.  Verbal order received to place 14Fr. Foley catheter.  Patient and wife educated on urinary catheter and CHG daily. Foley placed with 650cc clear yellow urine return.

## 2020-09-06 NOTE — Progress Notes (Signed)
Patient continues to c/o burning with urination and having small amounts of urination frequently.  Bladder scan done for 738cc and 652cc for 2 scans.  Urology paged, awaiting call back.

## 2020-09-06 NOTE — Therapy (Signed)
Pt taken off bipap per pt request and placed on HHFNC see flow sheet tolerating well.

## 2020-09-07 ENCOUNTER — Inpatient Hospital Stay (HOSPITAL_COMMUNITY): Payer: 59

## 2020-09-07 DIAGNOSIS — J9601 Acute respiratory failure with hypoxia: Secondary | ICD-10-CM | POA: Diagnosis not present

## 2020-09-07 LAB — CBC
HCT: 37.8 % — ABNORMAL LOW (ref 39.0–52.0)
Hemoglobin: 11.7 g/dL — ABNORMAL LOW (ref 13.0–17.0)
MCH: 28.6 pg (ref 26.0–34.0)
MCHC: 31 g/dL (ref 30.0–36.0)
MCV: 92.4 fL (ref 80.0–100.0)
Platelets: 227 10*3/uL (ref 150–400)
RBC: 4.09 MIL/uL — ABNORMAL LOW (ref 4.22–5.81)
RDW: 15.9 % — ABNORMAL HIGH (ref 11.5–15.5)
WBC: 9.1 10*3/uL (ref 4.0–10.5)
nRBC: 1.2 % — ABNORMAL HIGH (ref 0.0–0.2)

## 2020-09-07 LAB — BASIC METABOLIC PANEL
Anion gap: 10 (ref 5–15)
BUN: 17 mg/dL (ref 8–23)
CO2: 28 mmol/L (ref 22–32)
Calcium: 8.2 mg/dL — ABNORMAL LOW (ref 8.9–10.3)
Chloride: 93 mmol/L — ABNORMAL LOW (ref 98–111)
Creatinine, Ser: 0.62 mg/dL (ref 0.61–1.24)
GFR calc non Af Amer: >60 mL/min (ref >60)
Glucose, Bld: 160 mg/dL — ABNORMAL HIGH (ref 70–99)
Potassium: 5.4 mmol/L — ABNORMAL HIGH (ref 3.5–5.1)
Sodium: 131 mmol/L — ABNORMAL LOW (ref 135–145)

## 2020-09-07 LAB — GLUCOSE, CAPILLARY
Glucose-Capillary: 290 mg/dL — ABNORMAL HIGH (ref 70–99)
Glucose-Capillary: 266 mg/dL — ABNORMAL HIGH (ref 70–99)
Glucose-Capillary: 183 mg/dL — ABNORMAL HIGH (ref 70–99)
Glucose-Capillary: 155 mg/dL — ABNORMAL HIGH (ref 70–99)

## 2020-09-07 MED ORDER — IOHEXOL 300 MG/ML  SOLN
75.0000 mL | Freq: Once | INTRAMUSCULAR | Status: AC | PRN
  Administered 2020-09-07: 75 mL via INTRAVENOUS

## 2020-09-07 MED ORDER — HYDROCORTISONE 1 % EX CREA
TOPICAL_CREAM | Freq: Two times a day (BID) | CUTANEOUS | Status: DC
  Administered 2020-09-08 – 2020-09-20 (×6): 1 via TOPICAL
  Filled 2020-09-07 (×2): qty 28

## 2020-09-07 NOTE — TOC Progression Note (Signed)
Transition of Care [facility]) - Progression Note    Patient Details  Name: Tim Day MRN: [id number] Date of Birth: 28-Mar-1951  Transition [facility] CM/SW Contact  Graves-Bigelow, Ocie Cornfield, RN Phone Number: 09/07/2020, 10:05 AM  Clinical Narrative: Patient continues on heated high flow 02 at 20 Liters. Patient is being followed by CIR- unable to tolerate intensity at this time for CIR per rehab admissions RN. Case Manager following for additional transition of care needs.   Expected Discharge Plan: [place] Barriers to Discharge: Continued Medical Work up  Expected Discharge Plan and Services Expected Discharge Plan: [place] In-house Referral: Clinical Social Work Discharge Planning Services: CM Consult   Living arrangements for the past 2 months: Single Family Home                                       Social Determinants of Health (SDOH) Interventions    Readmission Risk Interventions No flowsheet data found.

## 2020-09-07 NOTE — Evaluation (Signed)
Occupational Therapy Evaluation Patient Details Name: Tim Day MRN: [id number] DOB: Sep 25, 1951 Today's Date: 09/07/2020    History of Present Illness 69 year old with hx of recent COVID pneumonia diagnosis (9/7-9/13, discharge on 2L O2), BL COVID induced DVTs, DM2, GERD HTN presenting with worsening SOB. Per chart found to have left pneumothorax and worsening bilateral infiltrates. Intubated 9/19-9/20    Clinical Impression   Patient admitted for the above diagnosis.  Presents with high flow O2 needs, generalized weakness, decreased sit and stand balance, anxiety surrounding shortness of breath.  Barriers significantly impair self care, functional mobility and toilet skills compared to stated prior level of function.  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  O2 monitored throughout.  Patient never sat lower than 98% on 20 L HFNC at 50% FiO2.  HR ranged 107-110 with bed mobility.  Monitored throughout.  OT will continue to follow in the acute care setting.  CIR would be an ideal discharge level of care.       Follow Up Recommendations  CIR    Equipment Recommendations  3 in 1 bedside commode;Tub/shower seat;Wheelchair cushion (measurements OT);Wheelchair (measurements OT);Hospital bed    Recommendations for Other Services Rehab consult     Precautions / Restrictions Precautions Precautions: Fall Precaution Comments: watch 02, watch HR, and RR, heated HF 02 Restrictions Weight Bearing Restrictions: No      Mobility Bed Mobility Overal bed mobility: Needs Assistance Bed Mobility: Supine to Sit;Sit to Supine     Supine to sit: Mod assist;HOB elevated Sit to supine: Max assist;HOB elevated      Transfers Overall transfer level: Needs assistance   Transfers: Sit to/from Stand Sit to Stand: Mod assist         General transfer comment: Stood times one for 5 sec.   Patient has the ability to do more.    Balance Overall balance assessment: Needs assistance Sitting-balance support: Single extremity supported Sitting balance-Leahy Scale: Fair     Standing balance support: Bilateral upper extremity supported Standing balance-Leahy Scale: Poor                             ADL either performed or assessed with clinical judgement   ADL Overall ADL's : Needs assistance/impaired Eating/Feeding: Set up;Bed level   Grooming: Minimal assistance;Bed level       Lower Body Bathing: Maximal assistance;Bed level       Lower Body Dressing: Maximal assistance;Bed level       Toileting- Clothing Manipulation and Hygiene: Total assistance;Bed level               Vision Baseline Vision/History: Wears glasses Wears Glasses: Reading only Patient Visual Report: No change from baseline       Perception     Praxis      Pertinent Vitals/Pain Pain Assessment: No/denies pain     Hand Dominance Right   Extremity/Trunk Assessment Upper Extremity Assessment Upper Extremity Assessment: Generalized weakness   Lower Extremity Assessment Lower Extremity Assessment: Defer to PT evaluation   Cervical / Trunk Assessment Cervical / Trunk Assessment: Kyphotic   Communication Communication Communication: No difficulties   Cognition Arousal/Alertness: Awake/alert Behavior During Therapy: Anxious Overall Cognitive Status: Within Functional Limits for tasks assessed  General Comments       Exercises     Shoulder Instructions      Home Living Family/patient expects to be discharged to:: Inpatient rehab Living Arrangements: Spouse/significant other Available Help at Discharge: Family;Available 24 hours/day Type of Home: House Home Access: Stairs to enter [employer] of Steps: 6 Entrance Stairs-Rails: Right Home Layout: One level     Bathroom Shower/Tub: Emergency planning/management officer: Standard Bathroom Accessibility: Yes How Accessible: Accessible via walker Home Equipment: None   Additional Comments: Spouse in room, assisting with peri care after BM      Prior Functioning/Environment Level of Independence: Independent        Comments: Hydrographic surveyor, drives, works using Biochemist, clinical Problem List: Decreased strength;Decreased activity tolerance;Impaired balance (sitting and/or standing);Decreased knowledge of use of DME or AE;Decreased knowledge of precautions;Cardiopulmonary status limiting activity      OT Treatment/Interventions: Self-care/ADL training;Energy conservation;DME and/or AE instruction;Therapeutic activities;Patient/family education;Balance training;Therapeutic exercise    OT Goals(Current goals can be found in the care plan section) Acute Rehab OT Goals Patient Stated Goal: Hoping to breath better so I can do more OT Goal Formulation: With patient Time For Goal Achievement: 09/21/20 Potential to Achieve Goals: Fair ADL Goals Pt Will Perform Grooming: with supervision;sitting Pt Will Perform Upper Body Bathing: with supervision;sitting Pt Will Perform Upper Body Dressing: with supervision;sitting Pt Will Transfer to Toilet: with min assist;stand pivot transfer;bedside commode  OT Frequency: Min 2X/week   Barriers to D/C: Other (comment)  medical status       Co-evaluation              AM-PAC OT "6 Clicks" Daily Activity     Outcome Measure Help from another person eating meals?: A Little Help from another person taking care of personal grooming?: A Little Help from another person toileting, which includes using toliet, bedpan, or urinal?: Total Help from another person bathing (including washing, rinsing, drying)?: A Lot Help from another person to put on and taking off regular upper body clothing?: A Lot Help from another person to put on and taking off regular lower  body clothing?: A Lot 6 Click Score: 13   End of Session Equipment Utilized During Treatment: Surveyor, mining Communication: Other (comment) (cleared for session)  Activity Tolerance: Patient limited by fatigue Patient left: in bed;with call bell/phone within reach;with family/visitor present  OT Visit Diagnosis: Unsteadiness on feet (R26.81);Muscle weakness (generalized) (M62.81)                Time: 1517-6160 OT Time Calculation (min): 33 min Charges:  OT General Charges $OT Visit: 1 Visit OT Evaluation $OT Eval Moderate Complexity: 1 Mod OT Treatments $Self Care/Home Management : 8-22 mins  09/07/2020  Rich, OTR/L  Acute Rehabilitation Services  Office:  [place] 09/07/2020, 3:22 PM

## 2020-09-07 NOTE — Progress Notes (Signed)
PROGRESS NOTE    Tim Day  AVW:[id number] DOB: 24-Nov-1951 DOA: 08/19/2020 PCP: Celene Squibb, MD (Confirm with patient/family/NH records and if not entered, this HAS to be entered at [facility] point of entry. "No PCP" if truly none.)   Brief Narrative: (Start on day 1 of progress note - keep it brief and live)  69 year old Caucasian male with history of hypertension, diabetes mellitus, DVT presented to ED with worsening hypoxic respiratory failure and left pneumothorax. Patient was initially intubated and managed in ICU. Patient was successfully extubated and patient is still on high flow nasal cannula at 40 L with FiO2 of 50%. Patient also had urinary retention and Foley catheter was placed by urology.Foley catheter was removed and patient was voiding well on his own with Flomax.  Patient is still on high flow oxygen nasal cannula at the rate of 20 liters with FiO2 of 50%.  Patient is also having difficulty in voiding and bladder scan showed more than 600 mL of urine.  Urology recommended to replace the Foley catheter.  Today patient had a sudden loss of vision in his right eye for few minutes and then his pain came back but it remained blurry for 15 to 20 minutes.  Other  neurological deficits but mild headache so CT head and carotid duplex ultrasound ordered.  Assessment & Plan:   Active Problems:   DM2 (diabetes mellitus, type 2) ([place])   Benign essential HTN   Dyslipidemia   Chest tube in place   COVID-19 virus infection   Acute hypoxemic respiratory failure due to COVID-19 [facility])   Pneumothorax, left   Pneumonia due to COVID-19 virus   Leg DVT (deep venous thromboembolism), acute, bilateral ([facility])   Pressure injury of skin   Acute respiratory failure with hypoxia ([place])   HCAP (healthcare-associated pneumonia)   Primary spontaneous pneumothorax   Protein-calorie malnutrition, severe   Acute hypoxic respiratory failure secondary to Covid ARDS and left  pneumothorax present on admission Patient was managed in ICU earlier was intubated. Patient is still on high flow nasal cannula at the rate of 20 L and FiO2 of 50%.BiPAP during the night.Plan is to wean down oxygen by maintaining oxygen saturation within normal limits. Patient also had left-sided pneumothorax and left chest tube was placed that is successfully removed and no evidence of residual pneumothorax. Patient was treated for Covid pneumonia with Decadron and remdesivir and no contact precautions needed at this time because it is more than 21 days. Continue to monitor.  The plan is to discharge to inpatient rehab but cannot be discharged with high requirement of oxygen at this time.  Left pneumothorax Improvedafter chest tube.  Unilateral vision loss Patient had a sudden loss of vision in his right eye and he was unable to see anything for few minutes and later on his vision came back but it remained bloody for 15 to 20 minutes and there was also mild headache.  CT head without contrast done carotid duplex ultrasound ordered.  Acute urinary retention Urology is following up with patient. Foley catheter was discontinued 2 days ago and patient was able to urinate well without any problem with Flomax.  Patient had problem with voiding yesterday and bladder scan showed 600 mL of urine.  Urology recommended to replace the Foley catheter.  Acute cystitis Patient denies fever, chills, dysuria, burning micturition and hematuria. Urine culture was positive for enterobacter Aerogensthat was susceptible to ceftriaxone and Bactrim. Ceftriaxone was discontinued earlier after the culture results and and  completed his course with Bactrim.  Bilateral lower extremity DVT Continue Eliquis  Anxiety Continue Ativan as needed twice daily  Hyponatremia Sodium is 131 today. Continue to monitor  Hyperkalemia Resolved. Continue to monitor  Diabetes mellitus Continue sliding scale  insulin.Lantus is increased to 25 units twice daily.Blood glucose monitoring and hypoglycemicprotocol inplace.  Constipation Continue Colace   DVT prophylaxis: Eliquis Code Status: Full code Family Communication: Patient's wife present at the bedside Disposition Plan: Inpatient rehab Consultants:   Urology  Critical care  Procedures: (Don't include imaging studies which can be auto populated. Include things that cannot be auto populated i.e. Echo, Carotid and venous dopplers, Foley, Bipap, HD, tubes/drains, wound vac, central lines etc)  Intubation  Chest tube placement  Antimicrobials: (specify start and planned stop date. Auto populated tables are space occupying and do not give end dates)  Ceftriaxone, Bactrim   Subjective: Patient is seen and evaluated at the bedside today.  Patient's wife also present at the bedside.  Patient states that he had lost vision in his right eye suddenly and was unable to see anything and later on again came back but  it is still blurry.  Patient is also complaining of mild headache but no fever, chills dizziness, weakness, numbness and tingling sensation in any part of his body, chest pain, nausea, vomiting, abdominal pain and urinary symptoms.  Objective: Vitals:   09/07/20 1148 09/07/20 1350 09/07/20 1605 09/07/20 1638  BP: 109/71   126/72  Pulse: (!) 109 (!) 103  (!) 104  Resp: (!) 29 (!) 40  (!) 31  Temp: 98 F (36.7 C)   98.6 F (37 C)  TempSrc: Axillary   Oral  SpO2: 99% 95% 97% 99%  Weight:      Height:        Intake/Output Summary (Last 24 hours) at 09/07/2020 1656 Last data filed at 09/07/2020 1639 Gross per 24 hour  Intake 960 ml  Output 4030 ml  Net -3070 ml   Filed Weights   08/27/20 0536 08/29/20 0550 09/04/20 0536  Weight: 80 kg 80 kg 78.7 kg    Examination:  General exam:Appears calm and comfortable  Respiratory system:Patient is on high flow oxygen supplementation with nasal cannula. Coarse and  diminished breath sounds and bilateral lower lobes with mild wheezing. Cardiovascular system:S1 &S2 heard, RRR. No JVD, murmurs, rubs, gallops or clicks. No pedal edema. Gastrointestinal system:Abdomen is nondistended, soft and nontender. No organomegaly or masses felt. Normal bowel sounds heard. Central nervous system:Alert and oriented. No focal neurological deficits. Extremities: Symmetric  Skin: Small rash looks like a heat rash on abdomen. Psychiatry:Judgement and insight appear normal. Mood &affect appropriate     Data Reviewed: I have personally reviewed following labs and imaging studies  CBC: Recent Labs  Lab 09/03/20 0220 09/04/20 0247 09/05/20 0321 09/06/20 0202 09/07/20 0356  WBC 7.8 7.1 8.4 7.6 9.1  HGB 11.6* 10.9* 11.3* 11.8* 11.7*  HCT 35.1* 34.3* 35.4* 36.9* 37.8*  MCV 89.1 89.1 89.4 92.0 92.4  PLT 180 165 174 185 676   Basic Metabolic Panel: Recent Labs  Lab 09/03/20 0220 09/04/20 0247 09/05/20 0321 09/06/20 0202 09/07/20 0356  NA 131* 131* 131* 131* 131*  K 5.0 4.8 5.0 5.1 5.4*  CL 91* 93* 94* 96* 93*  CO2 31 30 28 27 28   GLUCOSE 230* 225* 159* 183* 160*  BUN 21 23 21 18 17   CREATININE 0.66 0.68 0.54* 0.64 0.62  CALCIUM 8.4* 8.0* 8.3* 8.1* 8.2*   GFR: Estimated  Creatinine Clearance: 92.5 mL/min (by C-G formula based on SCr of 0.62 mg/dL). Liver Function Tests: No results for input(s): AST, ALT, ALKPHOS, BILITOT, PROT, ALBUMIN in the last 168 hours. No results for input(s): LIPASE, AMYLASE in the last 168 hours. No results for input(s): AMMONIA in the last 168 hours. Coagulation Profile: No results for input(s): INR, PROTIME in the last 168 hours. Cardiac Enzymes: No results for input(s): CKTOTAL, CKMB, CKMBINDEX, TROPONINI in the last 168 hours. BNP (last 3 results) No results for input(s): PROBNP in the last 8760 hours. HbA1C: No results for input(s): HGBA1C in the last 72 hours. CBG: Recent Labs  Lab 09/06/20 1140 09/06/20 1633  09/06/20 2210 09/07/20 0800 09/07/20 1144  GLUCAP 265* 155* 243* 290* 266*   Lipid Profile: No results for input(s): CHOL, HDL, LDLCALC, TRIG, CHOLHDL, LDLDIRECT in the last 72 hours. Thyroid Function Tests: No results for input(s): TSH, T4TOTAL, FREET4, T3FREE, THYROIDAB in the last 72 hours. Anemia Panel: No results for input(s): VITAMINB12, FOLATE, FERRITIN, TIBC, IRON, RETICCTPCT in the last 72 hours. Sepsis Labs: No results for input(s): PROCALCITON, LATICACIDVEN in the last 168 hours.  Recent Results (from the past 240 hour(s))  Culture, Urine     Status: Abnormal   Collection Time: 08/31/20  5:35 PM   Specimen: Urine, Catheterized  Result Value Ref Range Status   Specimen Description URINE, CATHETERIZED  Final   Special Requests   Final    NONE Performed at [facility], [address], [place], [place] [address]    Culture >=100,000 COLONIES/mL ENTEROBACTER AEROGENES (A)  Final   Report Status 09/02/2020 FINAL  Final   Organism ID, Bacteria ENTEROBACTER AEROGENES (A)  Final      Susceptibility   Enterobacter aerogenes - MIC*    CEFAZOLIN RESISTANT Resistant     CEFTRIAXONE <=0.25 SENSITIVE Sensitive     CIPROFLOXACIN <=0.25 SENSITIVE Sensitive     GENTAMICIN <=1 SENSITIVE Sensitive     IMIPENEM 2 SENSITIVE Sensitive     NITROFURANTOIN 32 SENSITIVE Sensitive     TRIMETH/SULFA <=20 SENSITIVE Sensitive     PIP/TAZO <=4 SENSITIVE Sensitive     * >=100,000 COLONIES/mL ENTEROBACTER AEROGENES         Radiology Studies: No results found.      Scheduled Meds: . albuterol  2 puff Inhalation Q6H  . apixaban  5 mg Oral BID  . chlorhexidine  15 mL Mouth Rinse BID  . Chlorhexidine Gluconate Cloth  6 each Topical Daily  . docusate  100 mg Oral BID  . feeding supplement (ENSURE ENLIVE)  237 mL Oral TID BM  . insulin aspart  0-15 Units Subcutaneous TID WC  . insulin aspart  0-5 Units Subcutaneous QHS  . insulin aspart  12 Units Subcutaneous TID WC  . insulin  glargine  25 Units Subcutaneous BID  . linagliptin  5 mg Oral Daily  . mouth rinse  15 mL Mouth Rinse q12n4p  . pantoprazole sodium  40 mg Per Tube QHS  . polyethylene glycol  17 g Oral BID  . tamsulosin  0.4 mg Oral Daily   Continuous Infusions: . sodium chloride       LOS: 19 days    Time spent:     Edmonia Lynch, MD [facility] Pager [phone]  If 7PM-7AM, please contact night-coverage [email or web address] Password [facility] 09/07/2020, 4:56 PM

## 2020-09-07 NOTE — Progress Notes (Signed)
Patient complaining of not being able to see out of his right eye. States that this happened suddenly. RN performed full neuro assessment and patient did not have any other deficits. After about 5 minutes, patient stated that he could now see out of his right eye but that vision was blurry. He stated that it was slowly starting to improve. Notified Dr. Humphrey Rolls via phone.

## 2020-09-07 NOTE — Progress Notes (Signed)
Inpatient Diabetes Program Recommendations  AACE/ADA: New Consensus Statement on Inpatient Glycemic Control (2015)  Target Ranges:  Prepandial:   less than 140 mg/dL      Peak postprandial:   less than 180 mg/dL (1-2 hours)      Critically ill patients:  140 - 180 mg/dL   Lab Results  Component Value Date   GLUCAP 266 (H) 09/07/2020   HGBA1C 7.2 (H) 08/08/2020    Review of Glycemic Control Results for [facility], Tim Day (MRN [id number]) as of 09/07/2020 13:56  Ref. Range 09/06/2020 08:28 09/06/2020 11:40 09/06/2020 16:33 09/06/2020 22:10 09/07/2020 08:00 09/07/2020 11:44  Glucose-Capillary Latest Ref Range: 70 - 99 mg/dL 252 (H) 265 (H) 155 (H) 243 (H) 290 (H) 266 (H)   Diabetes history: DM 2 Outpatient Diabetes medications:  Glucotrol 5 mg bid, Trulicity 1.5 weekly, Toujeo 25 units q AM, Metformin 1000 mg bid Current orders for Inpatient glycemic control:  Novolog moderate tid with meals and HS Lantus 25 units bid Novolog 12 units tid with meals Inpatient Diabetes Program Recommendations:   Please consider increasing Lantus 30 units bid.    Thanks,  Adah Perl, RN, BC-ADM Inpatient Diabetes Coordinator Pager [phone] (8a-5p)

## 2020-09-07 NOTE — Plan of Care (Signed)
  Problem: Education: Goal: Knowledge of General Education information will improve Description: Including pain rating scale, medication(s)/side effects and non-pharmacologic comfort measures Outcome: Progressing   Problem: Clinical Measurements: Goal: Will remain free from infection Outcome: Progressing Goal: Cardiovascular complication will be avoided Outcome: Progressing   Problem: Nutrition: Goal: Adequate nutrition will be maintained Outcome: Progressing   Problem: Elimination: Goal: Will not experience complications related to bowel motility Outcome: Progressing

## 2020-09-08 ENCOUNTER — Inpatient Hospital Stay (HOSPITAL_COMMUNITY): Payer: 59

## 2020-09-08 DIAGNOSIS — J9601 Acute respiratory failure with hypoxia: Secondary | ICD-10-CM | POA: Diagnosis not present

## 2020-09-08 DIAGNOSIS — R0602 Shortness of breath: Secondary | ICD-10-CM

## 2020-09-08 DIAGNOSIS — U071 COVID-19: Secondary | ICD-10-CM | POA: Diagnosis not present

## 2020-09-08 LAB — CBC
HCT: 38.5 % — ABNORMAL LOW (ref 39.0–52.0)
Hemoglobin: 12.1 g/dL — ABNORMAL LOW (ref 13.0–17.0)
MCH: 28.8 pg (ref 26.0–34.0)
MCHC: 31.4 g/dL (ref 30.0–36.0)
MCV: 91.7 fL (ref 80.0–100.0)
Platelets: 208 10*3/uL (ref 150–400)
RBC: 4.2 MIL/uL — ABNORMAL LOW (ref 4.22–5.81)
RDW: 15.9 % — ABNORMAL HIGH (ref 11.5–15.5)
WBC: 9.4 10*3/uL (ref 4.0–10.5)
nRBC: 0.9 % — ABNORMAL HIGH (ref 0.0–0.2)

## 2020-09-08 LAB — GLUCOSE, CAPILLARY
Glucose-Capillary: 189 mg/dL — ABNORMAL HIGH (ref 70–99)
Glucose-Capillary: 133 mg/dL — ABNORMAL HIGH (ref 70–99)
Glucose-Capillary: 227 mg/dL — ABNORMAL HIGH (ref 70–99)
Glucose-Capillary: 222 mg/dL — ABNORMAL HIGH (ref 70–99)

## 2020-09-08 LAB — BASIC METABOLIC PANEL
Anion gap: 9 (ref 5–15)
BUN: 18 mg/dL (ref 8–23)
CO2: 29 mmol/L (ref 22–32)
Calcium: 8.2 mg/dL — ABNORMAL LOW (ref 8.9–10.3)
Chloride: 91 mmol/L — ABNORMAL LOW (ref 98–111)
Creatinine, Ser: 0.56 mg/dL — ABNORMAL LOW (ref 0.61–1.24)
GFR calc non Af Amer: >60 mL/min (ref >60)
Glucose, Bld: 195 mg/dL — ABNORMAL HIGH (ref 70–99)
Potassium: 5.1 mmol/L (ref 3.5–5.1)
Sodium: 129 mmol/L — ABNORMAL LOW (ref 135–145)

## 2020-09-08 MED ORDER — DOCUSATE SODIUM 100 MG PO CAPS
100.0000 mg | ORAL_CAPSULE | Freq: Two times a day (BID) | ORAL | Status: DC
  Administered 2020-09-09 – 2020-09-28 (×35): 100 mg via ORAL
  Filled 2020-09-08 (×36): qty 1

## 2020-09-08 NOTE — Progress Notes (Signed)
Physical Therapy Treatment Patient Details Name: Tim Day MRN: [id number] DOB: [birth year] Today's Date: 09/08/2020    History of Present Illness 69 year old with hx of recent COVID pneumonia diagnosis (9/7-9/13, discharge on 2L O2), BL COVID induced DVTs, DM2, GERD HTN presenting with worsening SOB. Per chart found to have left pneumothorax and worsening bilateral infiltrates. Intubated 9/19-9/20     PT Comments    Pt was seen for mobility with attempt to stand but became too anxious to follow through.  Pt did perform bed ex's afterward but due to very high RR with bipap did not tolerate much mobility.  After visit nurse came by and put pt on cannula with RR being much lower.  Follow up with him to try standing and sidestepping since O2 sats remained 97% or better during the entire session.   Follow Up Recommendations  CIR     Equipment Recommendations  Other (comment) (defer to next venue)    Recommendations for Other Services       Precautions / Restrictions Precautions Precautions: Fall Precaution Comments: watch 02, watch HR, and RR, heated HF 02 Restrictions Weight Bearing Restrictions: No    Mobility  Bed Mobility Overal bed mobility: Needs Assistance Bed Mobility: Supine to Sit;Sit to Supine Rolling: Mod assist   Supine to sit: Mod assist Sit to supine: Mod assist   General bed mobility comments: helped pt scoot up in bed, tends to hyperventilate  Transfers Overall transfer level: Needs assistance               General transfer comment: refused to stand  Ambulation/Gait             General Gait Details: declined   Stairs             Wheelchair Mobility    Modified Rankin (Stroke Patients Only)       Balance Overall balance assessment: Needs assistance   Sitting balance-Leahy Scale: Fair Sitting balance - Comments: became anxious about sitting and tended to elevate his RR but sats and HR were controlled Postural  control: Right lateral lean                                  Cognition Arousal/Alertness: Awake/alert Behavior During Therapy: Anxious Overall Cognitive Status: Within Functional Limits for tasks assessed                                 General Comments: reluctant to get up to stand      Exercises General Exercises - Lower Extremity Ankle Circles/Pumps: AAROM;5 reps Quad Sets: AROM;10 reps Heel Slides: AAROM;Both;10 reps;Supine Hip ABduction/ADduction: AAROM;10 reps    General Comments General comments (skin integrity, edema, etc.): Pt is assisted to side of bed, then became anxous about trying to stand.  Returned to bed quickly       Pertinent Vitals/Pain Pain Assessment: No/denies pain    Home Living                      Prior Function            PT Goals (current goals can now be found in the care plan section) Acute Rehab PT Goals Patient Stated Goal: none stated    Frequency    Min 3X/week      PT Plan Current plan remains  appropriate    Co-evaluation              AM-PAC PT "6 Clicks" Mobility   Outcome Measure  Help needed turning from your back to your side while in a flat bed without using bedrails?: A Little Help needed moving from lying on your back to sitting on the side of a flat bed without using bedrails?: A Lot Help needed moving to and from a bed to a chair (including a wheelchair)?: Total Help needed standing up from a chair using your arms (e.g., wheelchair or bedside chair)?: Total Help needed to walk in hospital room?: Total Help needed climbing 3-5 steps with a railing? : Total 6 Click Score: 9    End of Session Equipment Utilized During Treatment: Oxygen (HFNC 20 L) Activity Tolerance: Patient limited by fatigue;Other (comment) (anxiety) Patient left: in bed;with call bell/phone within reach;with bed alarm set;with family/visitor present Nurse Communication: Mobility status PT Visit  Diagnosis: Other abnormalities of gait and mobility (R26.89);Muscle weakness (generalized) (M62.81);Difficulty in walking, not elsewhere classified (R26.2)     Time: 9179-1505 PT Time Calculation (min) (ACUTE ONLY): 27 min  Charges:  $Therapeutic Exercise: 8-22 mins $Therapeutic Activity: 8-22 mins                  Ramond Dial 09/08/2020, 9:31 PM  Mee Hives, PT MS Acute Rehab Dept. Number: [place] [facility] [place]

## 2020-09-08 NOTE — Progress Notes (Signed)
Carotid artery duplex completed. Refer to "CV Proc" under chart review to view preliminary results.  09/08/2020 4:35 PM Kelby Aline., MHA, RVT, RDCS, RDMS

## 2020-09-08 NOTE — Progress Notes (Signed)
Inpatient Rehabilitation Admissions Coordinator  Patient continues to require 20 L HFNC with therapy. Not yet at a level to be considered for a rehab consult/admit. Other venues should be explored.I will follow at a distance.  Danne Baxter, RN, MSN Rehab Admissions Coordinator [phone] 09/08/2020 8:37 AM

## 2020-09-08 NOTE — Progress Notes (Signed)
PROGRESS NOTE    Tim Day  KGM:[id number] DOB: March 18, 1951 DOA: 08/19/2020 PCP: Celene Squibb, MD (Confirm with patient/family/NH records and if not entered, this HAS to be entered at [facility] point of entry. "No PCP" if truly none.)   Brief Narrative: (Start on day 1 of progress note - keep it brief and live) 69 year old Caucasian male with history of hypertension, diabetes mellitus, DVT presented to ED with worsening hypoxic respiratory failure and left pneumothorax. Patient was initially intubated and managed in ICU. Patient was successfully extubated and patient is still on high flow nasal cannula at 40 L with FiO2 of 50%. Patient also had urinary retention and Foley catheter was placed by urology.Foley catheterwasremoved and patient wasvoiding well on his own with Flomax.Patient is still on high flow oxygen nasal cannula at the rate of 20literswith FiO2 of 50%. Patient is also having difficulty in voiding and bladder scan showed more than 600 mL of urine. Urology recommended to replace the Foley catheter.   Yesterday patient had a sudden loss of vision in his right eye for few minutes and then his pain came back but it remained blurry for 15 to 20 minutes.  No other neurological deficits found on neurological examination.  CT head was ordered that was negative for acute intracranial pathology while carotid ultrasound is still pending and will most probably be done today.   Assessment & Plan:   Active Problems:   DM2 (diabetes mellitus, type 2) ([place])   Benign essential HTN   Dyslipidemia   Chest tube in place   COVID-19 virus infection   Acute hypoxemic respiratory failure due to COVID-19 [facility])   Pneumothorax, left   Pneumonia due to COVID-19 virus   Leg DVT (deep venous thromboembolism), acute, bilateral ([facility])   Pressure injury of skin   Acute respiratory failure with hypoxia ([place])   HCAP (healthcare-associated pneumonia)   Primary spontaneous pneumothorax    Protein-calorie malnutrition, severe   Acute hypoxic respiratory failure secondary to Covid ARDS and left pneumothorax present on admission Patient was managed in ICU earlier was intubated. Patient is still on high flow nasal cannula at the rate of20 L and FiO2 of 50%.BiPAP during the night.Plan is to wean down oxygen by maintaining oxygen saturation within normal limits. Patient also had left-sided pneumothorax and left chest tube was placed that is successfully removed and no evidence of residual pneumothorax. Patient was treated for Covid pneumonia with Decadron and remdesivir and no contact precautions needed at this time because it is more than 21 days. Continue to monitor. The plan is to discharge to inpatient rehab but cannot be discharged with high requirement of oxygen at this time.  It is assessed again by rehab today.  Left pneumothorax Improvedafter chest tube.  Unilateral vision loss Patient had a sudden loss of vision in his right eye yesterday and he was unable to see anything for few minutes and later on his vision came back but it remained bloody for 15 to 20 minutes and there was also mild headache.  CT head without contrast done that was negative for acute intracranial pathology while carotid duplex ultrasound ordered and will be done today  Acute urinary retention Urology is following up with patient.  Patient had problem with voiding and bladder scan showed 600 mL of urine. Urology recommended to replace the Foley catheter.  Continue Flomax.  Acute cystitis Patient denies fever, chills, dysuria, burning micturition and hematuria. Urine culture was positive for enterobacter Aerogensthat was susceptible to ceftriaxone  and Bactrim. Ceftriaxonewasdiscontinuedearlier after the culture results and and completed his course with Bactrim.  Bilateral lower extremity DVT Continue Eliquis  Anxiety Continue Ativan as needed twice daily  Hyponatremia Sodium  is 131 today. Continue to monitor  Hyperkalemia Resolved. Continue to monitor  Diabetes mellitus Continue sliding scale insulin.Lantus is increased to 25 units twice daily.Blood glucose monitoring and hypoglycemicprotocol inplace.  Constipation Continue Colace    DVT prophylaxis: (Lovenox/Heparin/SCD's/anticoagulated/None (if comfort care) Code Status: (Full/Partial - specify details) Family Communication: (Specify name, relationship & date discussed. NO "discussed with patient") Disposition Plan: (specify when and where you expect patient to be discharged). Include barriers to DC in this tab.   Consultants:   Urology  Critical care  Procedures: (Don't include imaging studies which can be auto populated. Include things that cannot be auto populated i.e. Echo, Carotid and venous dopplers, Foley, Bipap, HD, tubes/drains, wound vac, central lines etc)  Intubation   Chest tube placement  Antimicrobials: (specify start and planned stop date. Auto populated tables are space occupying and do not give end dates)  Ceftriaxone, Bactrim   Subjective: Patient seen and evaluated at the bedside this morning.  Patient states that he is feeling much better but oxygen requirement is still the same.  She denies any more episodes of blurry vision.  Patient's wife also present at the bedside.  Patient admits of having with exertion otherwise denies fever, chills, chest pain, nausea, vomiting, abdominal pain, diarrhea and urinary symptoms.  Objective: Vitals:   09/08/20 1422 09/08/20 1423 09/08/20 1515 09/08/20 1516  BP:      Pulse: (!) 103 100    Resp: (!) 38 (!) 26    Temp:      TempSrc:      SpO2: 98% 97% 98% 100%  Weight:      Height:        Intake/Output Summary (Last 24 hours) at 09/08/2020 1615 Last data filed at 09/08/2020 0800 Gross per 24 hour  Intake 720 ml  Output 1901 ml  Net -1181 ml   Filed Weights   08/29/20 0550 09/04/20 0536 09/08/20 0500  Weight:  80 kg 78.7 kg 78.4 kg    Examination:  General exam: Appears calm and comfortable but still high flow nasal cannula Respiratory system: Patient is still on high flow nasal cannula for oxygen supplementation.  Coarse and diminished breath sounds in bilateral lower lobes appreciated on auscultation.  Cardiovascular system: S1 & S2 heard, RRR. No JVD, murmurs, rubs, gallops or clicks. No pedal edema. Gastrointestinal system: Abdomen is nondistended, soft and nontender. No organomegaly or masses felt. Normal bowel sounds heard. Central nervous system: Alert and oriented. No focal neurological deficits. Extremities: Symmetric.  Muscle strength is 4/5 in bilateral lower extremities while 5/5 in bilateral upper extremities Skin: No rashes, lesions or ulcers Psychiatry: Judgement and insight appear normal. Mood & affect appropriate.     Data Reviewed: I have personally reviewed following labs and imaging studies  CBC: Recent Labs  Lab 09/04/20 0247 09/05/20 0321 09/06/20 0202 09/07/20 0356 09/08/20 0215  WBC 7.1 8.4 7.6 9.1 9.4  HGB 10.9* 11.3* 11.8* 11.7* 12.1*  HCT 34.3* 35.4* 36.9* 37.8* 38.5*  MCV 89.1 89.4 92.0 92.4 91.7  PLT 165 174 185 227 268   Basic Metabolic Panel: Recent Labs  Lab 09/04/20 0247 09/05/20 0321 09/06/20 0202 09/07/20 0356 09/08/20 0215  NA 131* 131* 131* 131* 129*  K 4.8 5.0 5.1 5.4* 5.1  CL 93* 94* 96* 93* 91*  CO2 30  28 27 28 29   GLUCOSE 225* 159* 183* 160* 195*  BUN 23 21 18 17 18   CREATININE 0.68 0.54* 0.64 0.62 0.56*  CALCIUM 8.0* 8.3* 8.1* 8.2* 8.2*   GFR: Estimated Creatinine Clearance: 92.5 mL/min (A) (by C-G formula based on SCr of 0.56 mg/dL (L)). Liver Function Tests: No results for input(s): AST, ALT, ALKPHOS, BILITOT, PROT, ALBUMIN in the last 168 hours. No results for input(s): LIPASE, AMYLASE in the last 168 hours. No results for input(s): AMMONIA in the last 168 hours. Coagulation Profile: No results for input(s): INR, PROTIME  in the last 168 hours. Cardiac Enzymes: No results for input(s): CKTOTAL, CKMB, CKMBINDEX, TROPONINI in the last 168 hours. BNP (last 3 results) No results for input(s): PROBNP in the last 8760 hours. HbA1C: No results for input(s): HGBA1C in the last 72 hours. CBG: Recent Labs  Lab 09/07/20 1144 09/07/20 1637 09/07/20 2058 09/08/20 0753 09/08/20 1101  GLUCAP 266* 183* 155* 189* 133*   Lipid Profile: No results for input(s): CHOL, HDL, LDLCALC, TRIG, CHOLHDL, LDLDIRECT in the last 72 hours. Thyroid Function Tests: No results for input(s): TSH, T4TOTAL, FREET4, T3FREE, THYROIDAB in the last 72 hours. Anemia Panel: No results for input(s): VITAMINB12, FOLATE, FERRITIN, TIBC, IRON, RETICCTPCT in the last 72 hours. Sepsis Labs: No results for input(s): PROCALCITON, LATICACIDVEN in the last 168 hours.  Recent Results (from the past 240 hour(s))  Culture, Urine     Status: Abnormal   Collection Time: 08/31/20  5:35 PM   Specimen: Urine, Catheterized  Result Value Ref Range Status   Specimen Description URINE, CATHETERIZED  Final   Special Requests   Final    NONE Performed at [facility], [address], [place], [place] [address]    Culture >=100,000 COLONIES/mL ENTEROBACTER AEROGENES (A)  Final   Report Status 09/02/2020 FINAL  Final   Organism ID, Bacteria ENTEROBACTER AEROGENES (A)  Final      Susceptibility   Enterobacter aerogenes - MIC*    CEFAZOLIN RESISTANT Resistant     CEFTRIAXONE <=0.25 SENSITIVE Sensitive     CIPROFLOXACIN <=0.25 SENSITIVE Sensitive     GENTAMICIN <=1 SENSITIVE Sensitive     IMIPENEM 2 SENSITIVE Sensitive     NITROFURANTOIN 32 SENSITIVE Sensitive     TRIMETH/SULFA <=20 SENSITIVE Sensitive     PIP/TAZO <=4 SENSITIVE Sensitive     * >=100,000 COLONIES/mL ENTEROBACTER AEROGENES         Radiology Studies: CT HEAD W & WO CONTRAST  Result Date: 09/07/2020 CLINICAL DATA:  Right eye vision loss. EXAM: CT HEAD WITHOUT AND WITH CONTRAST  TECHNIQUE: Contiguous axial images were obtained from the base of the skull through the vertex without and with intravenous contrast CONTRAST:  39mL OMNIPAQUE IOHEXOL 300 MG/ML  SOLN COMPARISON:  None. FINDINGS: Brain: There is no evidence of an acute infarct, intracranial hemorrhage, mass, midline shift, or extra-axial fluid collection. Mild cerebral atrophy is within normal limits for age. Hypodensities in the cerebral white matter bilaterally are nonspecific but compatible with mild chronic small vessel ischemic disease. No abnormal enhancement is identified. Vascular: Calcified atherosclerosis at the skull base. Major dural venous sinuses and large intracranial arteries are grossly patent. Skull: No fracture or suspicious osseous lesion. Sinuses/Orbits: Visualized paranasal sinuses are clear. Moderate right mastoid effusion. Bilateral cataract extraction. Other: None. IMPRESSION: 1. No evidence of acute intracranial abnormality or mass. 2. Mild chronic small vessel ischemic disease. 3. Moderate right mastoid effusion. Electronically Signed   By: Logan Bores  M.D.   On: 09/07/2020 18:19        Scheduled Meds: . albuterol  2 puff Inhalation Q6H  . apixaban  5 mg Oral BID  . chlorhexidine  15 mL Mouth Rinse BID  . Chlorhexidine Gluconate Cloth  6 each Topical Daily  . docusate sodium  100 mg Oral BID  . feeding supplement (ENSURE ENLIVE)  237 mL Oral TID BM  . hydrocortisone cream   Topical BID  . insulin aspart  0-15 Units Subcutaneous TID WC  . insulin aspart  0-5 Units Subcutaneous QHS  . insulin aspart  12 Units Subcutaneous TID WC  . insulin glargine  25 Units Subcutaneous BID  . linagliptin  5 mg Oral Daily  . mouth rinse  15 mL Mouth Rinse q12n4p  . pantoprazole sodium  40 mg Per Tube QHS  . polyethylene glycol  17 g Oral BID  . tamsulosin  0.4 mg Oral Daily   Continuous Infusions: . sodium chloride       LOS: 20 days    Time spent:     Edmonia Lynch, MD [facility] Pager [phone]  If 7PM-7AM, please contact night-coverage [email or web address] Password  09/08/2020, 4:15 PM

## 2020-09-08 NOTE — Discharge Instructions (Signed)

## 2020-09-09 DIAGNOSIS — I82403 Acute embolism and thrombosis of unspecified deep veins of lower extremity, bilateral: Secondary | ICD-10-CM

## 2020-09-09 DIAGNOSIS — U071 COVID-19: Secondary | ICD-10-CM | POA: Diagnosis not present

## 2020-09-09 DIAGNOSIS — J9311 Primary spontaneous pneumothorax: Secondary | ICD-10-CM | POA: Diagnosis not present

## 2020-09-09 DIAGNOSIS — J9601 Acute respiratory failure with hypoxia: Secondary | ICD-10-CM | POA: Diagnosis not present

## 2020-09-09 LAB — GLUCOSE, CAPILLARY
Glucose-Capillary: 204 mg/dL — ABNORMAL HIGH (ref 70–99)
Glucose-Capillary: 148 mg/dL — ABNORMAL HIGH (ref 70–99)
Glucose-Capillary: 227 mg/dL — ABNORMAL HIGH (ref 70–99)
Glucose-Capillary: 217 mg/dL — ABNORMAL HIGH (ref 70–99)

## 2020-09-09 LAB — BASIC METABOLIC PANEL
Anion gap: 8 (ref 5–15)
BUN: 17 mg/dL (ref 8–23)
CO2: 33 mmol/L — ABNORMAL HIGH (ref 22–32)
Calcium: 8.2 mg/dL — ABNORMAL LOW (ref 8.9–10.3)
Chloride: 90 mmol/L — ABNORMAL LOW (ref 98–111)
Creatinine, Ser: 0.58 mg/dL — ABNORMAL LOW (ref 0.61–1.24)
GFR, Estimated: >60 mL/min (ref >60)
Glucose, Bld: 130 mg/dL — ABNORMAL HIGH (ref 70–99)
Potassium: 4.9 mmol/L (ref 3.5–5.1)
Sodium: 131 mmol/L — ABNORMAL LOW (ref 135–145)

## 2020-09-09 LAB — CBC
HCT: 37.8 % — ABNORMAL LOW (ref 39.0–52.0)
Hemoglobin: 11.7 g/dL — ABNORMAL LOW (ref 13.0–17.0)
MCH: 28.6 pg (ref 26.0–34.0)
MCHC: 31 g/dL (ref 30.0–36.0)
MCV: 92.4 fL (ref 80.0–100.0)
Platelets: 235 10*3/uL (ref 150–400)
RBC: 4.09 MIL/uL — ABNORMAL LOW (ref 4.22–5.81)
RDW: 16.3 % — ABNORMAL HIGH (ref 11.5–15.5)
WBC: 7.2 10*3/uL (ref 4.0–10.5)
nRBC: 0.8 % — ABNORMAL HIGH (ref 0.0–0.2)

## 2020-09-09 NOTE — Progress Notes (Signed)
PROGRESS NOTE    Tim Day  FIE:[id number] DOB: Oct 13, 1951 DOA: 08/19/2020 PCP: Celene Squibb, MD    Chief Complaint  Patient presents with  . Covid Positive  . Shortness of Breath    Brief Narrative:  69 year old Caucasian male with history of hypertension, diabetes mellitus, DVT presented to ED with worsening hypoxic respiratory failure and left pneumothorax. Patient was initially intubated and managed in ICU. Patient was successfully extubated and patient is still on high flow nasal cannula at 40 L with FiO2 of 50%. Patient also had urinary retention and Foley catheter was placed by urology.Foley catheterwasremoved and patient wasvoiding well on his own with Flomax.Patient is still on high flow oxygen nasal cannula at the rate of 20literswith FiO2 of 50%. Patient is also having difficulty in voiding and bladder scan showed more than 600 mL of urine. Urology recommended to replace the Foley catheter.  Yesterday patient had a sudden loss of vision in his right eye for few minutes and then his pain came back but it remained blurry for 15 to 20 minutes.  No other neurological deficits found on neurological examination.  CT head was ordered that was negative for acute intracranial pathology while carotid ultrasound is still pending and will most probably be done today.  Subjective:  Anxious, denies pain No vision problem today Wife at bedside  Assessment & Plan:   Active Problems:   DM2 (diabetes mellitus, type 2) ([place])   Benign essential HTN   Dyslipidemia   Chest tube in place   COVID-19 virus infection   Acute hypoxemic respiratory failure due to COVID-19 [facility])   Pneumothorax, left   Pneumonia due to COVID-19 virus   Leg DVT (deep venous thromboembolism), acute, bilateral ([facility])   Pressure injury of skin   Acute respiratory failure with hypoxia ([place])   HCAP (healthcare-associated pneumonia)   Primary spontaneous pneumothorax   Protein-calorie  malnutrition, severe   Acute hypoxic respiratory failure secondary to Covid ARDS and left pneumothorax present on admission Patient was managed in ICU earlier was intubated. Patient is still on high flow nasal cannula at the rate of20 L and FiO2 of 50%.BiPAP during the night.Plan is to wean down oxygen by maintaining oxygen saturation within normal limits. Patient also had left-sided pneumothorax and left chest tube was placed that is successfully removed and no evidence of residual pneumothorax. Patient was treated for Covid pneumonia with Decadron and remdesivir and no contact precautions needed at this time because it is more than 21 days. Continue to monitor. The plan is to discharge to inpatient rehab but cannot be discharged with high requirement of oxygen at this time.  It is assessed again by rehab today.  Left pneumothorax Improvedafter chest tube.  Unilateral vision loss Patient had a sudden loss of vision in his right eye yesterday and he was unable to see anything for few minutes and later on his vision came back but it remained bloody for 15to 20 minutes and there was also mild headache. CT head without contrast done that was negative for acute intracranial pathology while carotid duplex ultrasound ordered and will be done today  Acute urinary retention Urology is following up with patient. Patient had problem with voidingand bladder scan showed 600 mL of urine. Urology recommended to replace the Foley catheter.  Continue Flomax.  Acute cystitis Patient denies fever, chills, dysuria, burning micturition and hematuria. Urine culture was positive for enterobacter Aerogensthat was susceptible to ceftriaxone and Bactrim. Ceftriaxonewasdiscontinuedearlier after the culture results and and  completed his course with Bactrim.  Bilateral lower extremity DVT Continue Eliquis  Anxiety Continue Ativan as needed twice daily  Hyponatremia Sodium is 131 today.  Continue to monitor  Hyperkalemia Resolved. Continue to monitor  Diabetes mellitus Continue sliding scale insulin.Lantus is increased to 25 units twice daily.Blood glucose monitoring and hypoglycemicprotocol inplace.  Constipation Continue Colace     DVT prophylaxis: SCDs Start: 08/19/20 1842 apixaban (ELIQUIS) tablet 5 mg   Code Status: Family Communication:  Disposition:   Status is: Inpatient   Dispo: The patient is from: home              Anticipated d/c is to: cir              Anticipated d/c date is: TBD              Patient currently remain on HFNC  Consultants:   Urology  Cardiology  Procedures:   Foley insertion    Objective: Vitals:   09/09/20 0451 09/09/20 0752 09/09/20 0804 09/09/20 0911  BP: 130/83 124/74    Pulse: 95 (!) 109 (!) 113   Resp: 20 20 (!) 22   Temp: 97.9 F (36.6 C) 100.3 F (37.9 C) 99 F (37.2 C)   TempSrc: Oral Oral Oral   SpO2: 100% 98% 99% 99%  Weight: 79.9 kg     Height:        Intake/Output Summary (Last 24 hours) at 09/09/2020 1022 Last data filed at 09/09/2020 0100 Gross per 24 hour  Intake 597 ml  Output 2075 ml  Net -1478 ml   Filed Weights   09/04/20 0536 09/08/20 0500 09/09/20 0451  Weight: 78.7 kg 78.4 kg 79.9 kg    Examination:  General exam: Anxious  Respiratory system: Diminished at bases , no wheezing, no rales, no rhonchi , tachypnea  Cardiovascular system: S1 & S2 heard, tachycardia , No pedal edema. Gastrointestinal system: Abdomen is nondistended, soft and nontender.  Normal bowel sounds heard. Central nervous system: Alert and oriented. No focal neurological deficits. Extremities: Generalized weakness Skin: No rashes, lesions or ulcers Psychiatry: Anxious    Data Reviewed: I have personally reviewed following labs and imaging studies  CBC: Recent Labs  Lab 09/05/20 0321 09/06/20 0202 09/07/20 0356 09/08/20 0215 09/09/20 0532  WBC 8.4 7.6 9.1 9.4 7.2  HGB 11.3* 11.8*  11.7* 12.1* 11.7*  HCT 35.4* 36.9* 37.8* 38.5* 37.8*  MCV 89.4 92.0 92.4 91.7 92.4  PLT 174 185 227 208 401    Basic Metabolic Panel: Recent Labs  Lab 09/05/20 0321 09/06/20 0202 09/07/20 0356 09/08/20 0215 09/09/20 0532  NA 131* 131* 131* 129* 131*  K 5.0 5.1 5.4* 5.1 4.9  CL 94* 96* 93* 91* 90*  CO2 28 27 28 29  33*  GLUCOSE 159* 183* 160* 195* 130*  BUN 21 18 17 18 17   CREATININE 0.54* 0.64 0.62 0.56* 0.58*  CALCIUM 8.3* 8.1* 8.2* 8.2* 8.2*    GFR: Estimated Creatinine Clearance: 92.5 mL/min (A) (by C-G formula based on SCr of 0.58 mg/dL (L)).  Liver Function Tests: No results for input(s): AST, ALT, ALKPHOS, BILITOT, PROT, ALBUMIN in the last 168 hours.  CBG: Recent Labs  Lab 09/08/20 0753 09/08/20 1101 09/08/20 1802 09/08/20 2103 09/09/20 0749  GLUCAP 189* 133* 227* 222* 204*     Recent Results (from the past 240 hour(s))  Culture, Urine     Status: Abnormal   Collection Time: 08/31/20  5:35 PM   Specimen: Urine, Catheterized  Result Value  Ref Range Status   Specimen Description URINE, CATHETERIZED  Final   Special Requests   Final    NONE Performed at [facility], [place] [address], [place], [place] [address]    Culture >=100,000 COLONIES/mL ENTEROBACTER AEROGENES (A)  Final   Report Status 09/02/2020 FINAL  Final   Organism ID, Bacteria ENTEROBACTER AEROGENES (A)  Final      Susceptibility   Enterobacter aerogenes - MIC*    CEFAZOLIN RESISTANT Resistant     CEFTRIAXONE <=0.25 SENSITIVE Sensitive     CIPROFLOXACIN <=0.25 SENSITIVE Sensitive     GENTAMICIN <=1 SENSITIVE Sensitive     IMIPENEM 2 SENSITIVE Sensitive     NITROFURANTOIN 32 SENSITIVE Sensitive     TRIMETH/SULFA <=20 SENSITIVE Sensitive     PIP/TAZO <=4 SENSITIVE Sensitive     * >=100,000 COLONIES/mL ENTEROBACTER AEROGENES         Radiology Studies: CT HEAD W & WO CONTRAST  Result Date: 09/07/2020 CLINICAL DATA:  Right eye vision loss. EXAM: CT HEAD WITHOUT AND WITH  CONTRAST TECHNIQUE: Contiguous axial images were obtained from the base of the skull through the vertex without and with intravenous contrast CONTRAST:  66mL OMNIPAQUE IOHEXOL 300 MG/ML  SOLN COMPARISON:  None. FINDINGS: Brain: There is no evidence of an acute infarct, intracranial hemorrhage, mass, midline shift, or extra-axial fluid collection. Mild cerebral atrophy is within normal limits for age. Hypodensities in the cerebral white matter bilaterally are nonspecific but compatible with mild chronic small vessel ischemic disease. No abnormal enhancement is identified. Vascular: Calcified atherosclerosis at the skull base. Major dural venous sinuses and large intracranial arteries are grossly patent. Skull: No fracture or suspicious osseous lesion. Sinuses/Orbits: Visualized paranasal sinuses are clear. Moderate right mastoid effusion. Bilateral cataract extraction. Other: None. IMPRESSION: 1. No evidence of acute intracranial abnormality or mass. 2. Mild chronic small vessel ischemic disease. 3. Moderate right mastoid effusion. Electronically Signed   By: Logan Bores M.D.   On: 09/07/2020 18:19   VAS US CAROTID  Result Date: 09/08/2020 Carotid Arterial Duplex Study Indications:       Visual disturbance. Comparison Study:  No prior study Performing Technologist: Maudry Mayhew MHA, RDMS, RVT, RDCS  Examination Guidelines: A complete evaluation includes B-mode imaging, spectral Doppler, color Doppler, and power Doppler as needed of all accessible portions of each vessel. Bilateral testing is considered an integral part of a complete examination. Limited examinations for reoccurring indications may be performed as noted.  Right Carotid Findings: +----------+--------+--------+--------+--------------------------+--------+           PSV cm/sEDV cm/sStenosisPlaque Description        Comments +----------+--------+--------+--------+--------------------------+--------+ CCA Prox  167     17                                                  +----------+--------+--------+--------+--------------------------+--------+ CCA Distal109     37                                                 +----------+--------+--------+--------+--------------------------+--------+ ICA Prox  109     37              smooth and heterogenous            +----------+--------+--------+--------+--------------------------+--------+  ICA Distal122     39                                                 +----------+--------+--------+--------+--------------------------+--------+ ECA       123     7               heterogenous and irregular         +----------+--------+--------+--------+--------------------------+--------+ +----------+--------+-------+----------------+-------------------+           PSV cm/sEDV cmsDescribe        Arm Pressure (mmHG) +----------+--------+-------+----------------+-------------------+ [id number]            Multiphasic, WNL                    +----------+--------+-------+----------------+-------------------+ +---------+--------+--+--------+--+---------+ VertebralPSV cm/s60EDV cm/s30Antegrade +---------+--------+--+--------+--+---------+  Left Carotid Findings: +----------+--------+--------+--------+-----------------------+--------+           PSV cm/sEDV cm/sStenosisPlaque Description     Comments +----------+--------+--------+--------+-----------------------+--------+ CCA Prox  160     37                                              +----------+--------+--------+--------+-----------------------+--------+ CCA Distal138     45                                              +----------+--------+--------+--------+-----------------------+--------+ ICA Prox  140     48              smooth and heterogenous         +----------+--------+--------+--------+-----------------------+--------+ ICA Distal94      35                                               +----------+--------+--------+--------+-----------------------+--------+ ECA       133     11              heterogenous                    +----------+--------+--------+--------+-----------------------+--------+ +----------+--------+--------+----------------+-------------------+           PSV cm/sEDV cm/sDescribe        Arm Pressure (mmHG) +----------+--------+--------+----------------+-------------------+ Subclavian170             Multiphasic, WNL                    +----------+--------+--------+----------------+-------------------+ +---------+--------+--+--------+--+---------+ VertebralPSV cm/s56EDV cm/s26Antegrade +---------+--------+--+--------+--+---------+   Summary: Right Carotid: Velocities in the right ICA are consistent with a 1-39% stenosis. Left Carotid: Velocities in the left ICA are consistent with a 1-39% stenosis. Vertebrals:  Bilateral vertebral arteries demonstrate antegrade flow. Subclavians: Normal flow hemodynamics were seen in bilateral subclavian              arteries. *See table(s) above for measurements and observations.  Electronically signed by Servando Snare MD on 09/08/2020 at 5:29:55 PM.    Final         Scheduled Meds: . albuterol  2 puff Inhalation Q6H  . apixaban  5 mg Oral  BID  . chlorhexidine  15 mL Mouth Rinse BID  . Chlorhexidine Gluconate Cloth  6 each Topical Daily  . docusate sodium  100 mg Oral BID  . feeding supplement (ENSURE ENLIVE)  237 mL Oral TID BM  . hydrocortisone cream   Topical BID  . insulin aspart  0-15 Units Subcutaneous TID WC  . insulin aspart  0-5 Units Subcutaneous QHS  . insulin aspart  12 Units Subcutaneous TID WC  . insulin glargine  25 Units Subcutaneous BID  . linagliptin  5 mg Oral Daily  . mouth rinse  15 mL Mouth Rinse q12n4p  . pantoprazole sodium  40 mg Per Tube QHS  . polyethylene glycol  17 g Oral BID  . tamsulosin  0.4 mg Oral Daily   Continuous Infusions: . sodium chloride       LOS: 21 days     Time spent: 25mins Greater than 50% of this time was spent in counseling, explanation of diagnosis, planning of further management, and coordination of care.  I have personally reviewed and interpreted on  09/09/2020 daily labs, tele strips, imagings as discussed above under date review session and assessment and plans.  I reviewed all nursing notes, pharmacy notes, consultant notes,  vitals, pertinent old records  I have discussed plan of care as described above with RN , patient and family on 09/09/2020  Voice Recognition /Dragon dictation system was used to create this note, attempts have been made to correct errors. Please contact the author with questions and/or clarifications.   Florencia Reasons, MD PhD FACP [facility]  Available via Epic secure chat 7am-7pm for nonurgent issues Please page for urgent issues To page the attending provider between 7A-7P or the covering provider during after hours 7P-7A, please log into the web site [email or web address] and access using universal [place] password for that web site. If you do not have the password, please call the hospital operator.    09/09/2020, 10:22 AM

## 2020-09-10 DIAGNOSIS — I82403 Acute embolism and thrombosis of unspecified deep veins of lower extremity, bilateral: Secondary | ICD-10-CM | POA: Diagnosis not present

## 2020-09-10 DIAGNOSIS — U071 COVID-19: Secondary | ICD-10-CM | POA: Diagnosis not present

## 2020-09-10 DIAGNOSIS — J9601 Acute respiratory failure with hypoxia: Secondary | ICD-10-CM | POA: Diagnosis not present

## 2020-09-10 DIAGNOSIS — J9311 Primary spontaneous pneumothorax: Secondary | ICD-10-CM | POA: Diagnosis not present

## 2020-09-10 LAB — BASIC METABOLIC PANEL
Anion gap: 6 (ref 5–15)
BUN: 19 mg/dL (ref 8–23)
CO2: 34 mmol/L — ABNORMAL HIGH (ref 22–32)
Calcium: 8.1 mg/dL — ABNORMAL LOW (ref 8.9–10.3)
Chloride: 89 mmol/L — ABNORMAL LOW (ref 98–111)
Creatinine, Ser: 0.57 mg/dL — ABNORMAL LOW (ref 0.61–1.24)
GFR, Estimated: >60 mL/min (ref >60)
Glucose, Bld: 196 mg/dL — ABNORMAL HIGH (ref 70–99)
Potassium: 4.7 mmol/L (ref 3.5–5.1)
Sodium: 129 mmol/L — ABNORMAL LOW (ref 135–145)

## 2020-09-10 LAB — GLUCOSE, CAPILLARY
Glucose-Capillary: 240 mg/dL — ABNORMAL HIGH (ref 70–99)
Glucose-Capillary: 228 mg/dL — ABNORMAL HIGH (ref 70–99)
Glucose-Capillary: 250 mg/dL — ABNORMAL HIGH (ref 70–99)
Glucose-Capillary: 156 mg/dL — ABNORMAL HIGH (ref 70–99)

## 2020-09-10 LAB — OSMOLALITY, URINE: Osmolality, Ur: 556 mOsm/kg (ref 300–900)

## 2020-09-10 LAB — SODIUM, URINE, RANDOM: Sodium, Ur: 56 mmol/L

## 2020-09-10 MED ORDER — MELATONIN 3 MG PO TABS
3.0000 mg | ORAL_TABLET | Freq: Every day | ORAL | Status: DC
  Administered 2020-09-10 – 2020-09-28 (×17): 3 mg via ORAL
  Filled 2020-09-10 (×18): qty 1

## 2020-09-10 NOTE — Progress Notes (Signed)
PROGRESS NOTE    Tim Day  WPY:[id number] DOB: 10/30/1951 DOA: 08/19/2020 PCP: Celene Squibb, MD    Chief Complaint  Patient presents with  . Covid Positive  . Shortness of Breath    Brief Narrative:  69 year old Caucasian male with history of hypertension, diabetes mellitus, DVT presented to ED with worsening hypoxic respiratory failure and left pneumothorax. Patient was initially intubated and managed in ICU. Patient was successfully extubated and patient is still on high flow nasal cannula at 40 L with FiO2 of 50%. Patient also had urinary retention and Foley catheter was placed by urology.Foley catheterwasremoved and patient wasvoiding well on his own with Flomax.Patient is still on high flow oxygen nasal cannula at the rate of 20literswith FiO2 of 50%. Patient is also having difficulty in voiding and bladder scan showed more than 600 mL of urine. Urology recommended to replace the Foley catheter.  Yesterday patient had a sudden loss of vision in his right eye for few minutes and then his pain came back but it remained blurry for 15 to 20 minutes.  No other neurological deficits found on neurological examination.  CT head was ordered that was negative for acute intracranial pathology while carotid ultrasound is still pending and will most probably be done today.  Subjective:  Remains on HFNC Anxious, denies pain No vision problem today Wife at bedside  Assessment & Plan:   Active Problems:   DM2 (diabetes mellitus, type 2) ([place])   Benign essential HTN   Dyslipidemia   Chest tube in place   COVID-19 virus infection   Acute hypoxemic respiratory failure due to COVID-19 [facility])   Pneumothorax, left   Pneumonia due to COVID-19 virus   Leg DVT (deep venous thromboembolism), acute, bilateral (HCC)   Pressure injury of skin   Acute respiratory failure with hypoxia ([place])   HCAP (healthcare-associated pneumonia)   Primary spontaneous pneumothorax    Protein-calorie malnutrition, severe   Acute hypoxic respiratory failure secondary to Covid ARDS and left pneumothorax present on admission Patient was managed in ICU earlier was intubated. Patient is still on high flow nasal cannula at the rate of20 L and FiO2 of 50%.BiPAP during the night.Plan is to wean down oxygen by maintaining oxygen saturation within normal limits. Patient also had left-sided pneumothorax and left chest tube was placed that is successfully removed and no evidence of residual pneumothorax. Patient was treated for Covid pneumonia with Decadron and remdesivir and no contact precautions needed at this time because it is more than 21 days. Continue to monitor. The plan is to discharge to inpatient rehab but cannot be discharged with high requirement of oxygen at this time.  consider LTAC?  Left pneumothorax Improvedafter chest tube.  Unilateral vision loss on 10/7 Patient had a sudden loss of vision in his right eye yesterday and he was unable to see anything for few minutes and later on his vision came back but it remained blurry for 15to 20 minutes and there was also mild headache.  CT head without contrast done that was negative for acute intracranial pathology   carotid duplex ultrasound unremarkable He is on eliquis already, will check lipid panel, check esr/crp  Acute urinary retention Urology is following up with patient. Patient had problem with voidingand bladder scan showed 600 mL of urine. Urology recommended to replace the Foley catheter.  Continue Flomax.  Acute cystitis Patient denies fever, chills, dysuria, burning micturition and hematuria. Urine culture was positive for enterobacter Aerogensthat was susceptible to ceftriaxone and  Bactrim. Ceftriaxonewasdiscontinuedearlier after the culture results and and completed his course with Bactrim.  Bilateral lower extremity DVT Continue Eliquis  Anxiety Continue Ativan as needed twice  daily  Hyponatremia Sodium 129- 131  Does not appear volume overloaded Will check urine sodium/osmo/serum osmo/am cortisol level , tsh wnl  Hyperkalemia Resolved. Continue to monitor  Diabetes mellitus Continue sliding scale insulin.Lantus is increased to 25 units twice daily.Blood glucose monitoring and hypoglycemicprotocol inplace.  Constipation Continue Colace     DVT prophylaxis: SCDs Start: 08/19/20 1842 apixaban (ELIQUIS) tablet 5 mg   Code Status: Family Communication:  Disposition:   Status is: Inpatient   Dispo: The patient is from: home              Anticipated d/c is to: CIR Vs LTAC              Anticipated d/c date is: TBD              Patient currently remains on HFNC  Consultants:   Urology  Cardiology  Procedures:   Foley insertion    Objective: Vitals:   09/10/20 0830 09/10/20 0849 09/10/20 1121 09/10/20 1256  BP:   139/73   Pulse:   (!) 106   Resp:   (!) 21   Temp:   98.4 F (36.9 C)   TempSrc:   Oral   SpO2: 94% 94% 95% 92%  Weight:      Height:        Intake/Output Summary (Last 24 hours) at 09/10/2020 1431 Last data filed at 09/10/2020 1426 Gross per 24 hour  Intake 600 ml  Output 4000 ml  Net -3400 ml   Filed Weights   09/08/20 0500 09/09/20 0451 09/10/20 0529  Weight: 78.4 kg 79.9 kg 83.1 kg    Examination:  General exam: Anxious  Respiratory system: Diminished at bases , no wheezing, no rales, no rhonchi , tachypnea  Cardiovascular system: S1 & S2 heard, tachycardia , No pedal edema. Gastrointestinal system: Abdomen is nondistended, soft and nontender.  Normal bowel sounds heard. Central nervous system: Alert and oriented. No focal neurological deficits. Extremities: Generalized weakness Skin: No rashes, lesions or ulcers Psychiatry: Anxious    Data Reviewed: I have personally reviewed following labs and imaging studies  CBC: Recent Labs  Lab 09/05/20 0321 09/06/20 0202 09/07/20 0356  09/08/20 0215 09/09/20 0532  WBC 8.4 7.6 9.1 9.4 7.2  HGB 11.3* 11.8* 11.7* 12.1* 11.7*  HCT 35.4* 36.9* 37.8* 38.5* 37.8*  MCV 89.4 92.0 92.4 91.7 92.4  PLT 174 185 227 208 643    Basic Metabolic Panel: Recent Labs  Lab 09/06/20 0202 09/07/20 0356 09/08/20 0215 09/09/20 0532 09/10/20 0158  NA 131* 131* 129* 131* 129*  K 5.1 5.4* 5.1 4.9 4.7  CL 96* 93* 91* 90* 89*  CO2 '27 28 29 ' 33* 34*  GLUCOSE 183* 160* 195* 130* 196*  BUN '18 17 18 17 19  ' CREATININE 0.64 0.62 0.56* 0.58* 0.57*  CALCIUM 8.1* 8.2* 8.2* 8.2* 8.1*    GFR: Estimated Creatinine Clearance: 92.5 mL/min (A) (by C-G formula based on SCr of 0.57 mg/dL (L)).  Liver Function Tests: No results for input(s): AST, ALT, ALKPHOS, BILITOT, PROT, ALBUMIN in the last 168 hours.  CBG: Recent Labs  Lab 09/09/20 1201 09/09/20 1717 09/09/20 2124 09/10/20 0801 09/10/20 1136  GLUCAP 148* 227* 217* 240* 228*     Recent Results (from the past 240 hour(s))  Culture, Urine     Status: Abnormal  Collection Time: 08/31/20  5:35 PM   Specimen: Urine, Catheterized  Result Value Ref Range Status   Specimen Description URINE, CATHETERIZED  Final   Special Requests   Final    NONE Performed at [facility], [address] [place], [place] [address]    Culture >=100,000 COLONIES/mL ENTEROBACTER AEROGENES (A)  Final   Report Status 09/02/2020 FINAL  Final   Organism ID, Bacteria ENTEROBACTER AEROGENES (A)  Final      Susceptibility   Enterobacter aerogenes - MIC*    CEFAZOLIN RESISTANT Resistant     CEFTRIAXONE <=0.25 SENSITIVE Sensitive     CIPROFLOXACIN <=0.25 SENSITIVE Sensitive     GENTAMICIN <=1 SENSITIVE Sensitive     IMIPENEM 2 SENSITIVE Sensitive     NITROFURANTOIN 32 SENSITIVE Sensitive     TRIMETH/SULFA <=20 SENSITIVE Sensitive     PIP/TAZO <=4 SENSITIVE Sensitive     * >=100,000 COLONIES/mL ENTEROBACTER AEROGENES         Radiology Studies: VAS US CAROTID  Result Date: 09/08/2020 Carotid  Arterial Duplex Study Indications:       Visual disturbance. Comparison Study:  No prior study Performing Technologist: Maudry Mayhew MHA, RDMS, RVT, RDCS  Examination Guidelines: A complete evaluation includes B-mode imaging, spectral Doppler, color Doppler, and power Doppler as needed of all accessible portions of each vessel. Bilateral testing is considered an integral part of a complete examination. Limited examinations for reoccurring indications may be performed as noted.  Right Carotid Findings: +----------+--------+--------+--------+--------------------------+--------+           PSV cm/sEDV cm/sStenosisPlaque Description        Comments +----------+--------+--------+--------+--------------------------+--------+ CCA Prox  167     17                                                 +----------+--------+--------+--------+--------------------------+--------+ CCA Distal109     37                                                 +----------+--------+--------+--------+--------------------------+--------+ ICA Prox  109     37              smooth and heterogenous            +----------+--------+--------+--------+--------------------------+--------+ ICA Distal122     39                                                 +----------+--------+--------+--------+--------------------------+--------+ ECA       123     7               heterogenous and irregular         +----------+--------+--------+--------+--------------------------+--------+ +----------+--------+-------+----------------+-------------------+           PSV cm/sEDV cmsDescribe        Arm Pressure (mmHG) +----------+--------+-------+----------------+-------------------+ [id number]            Multiphasic, WNL                    +----------+--------+-------+----------------+-------------------+ +---------+--------+--+--------+--+---------+ VertebralPSV cm/s60EDV cm/s30Antegrade  +---------+--------+--+--------+--+---------+  Left Carotid Findings: +----------+--------+--------+--------+-----------------------+--------+  PSV cm/sEDV cm/sStenosisPlaque Description     Comments +----------+--------+--------+--------+-----------------------+--------+ CCA Prox  160     37                                              +----------+--------+--------+--------+-----------------------+--------+ CCA Distal138     45                                              +----------+--------+--------+--------+-----------------------+--------+ ICA Prox  140     48              smooth and heterogenous         +----------+--------+--------+--------+-----------------------+--------+ ICA Distal94      35                                              +----------+--------+--------+--------+-----------------------+--------+ ECA       133     11              heterogenous                    +----------+--------+--------+--------+-----------------------+--------+ +----------+--------+--------+----------------+-------------------+           PSV cm/sEDV cm/sDescribe        Arm Pressure (mmHG) +----------+--------+--------+----------------+-------------------+ Subclavian170             Multiphasic, WNL                    +----------+--------+--------+----------------+-------------------+ +---------+--------+--+--------+--+---------+ VertebralPSV cm/s56EDV cm/s26Antegrade +---------+--------+--+--------+--+---------+   Summary: Right Carotid: Velocities in the right ICA are consistent with a 1-39% stenosis. Left Carotid: Velocities in the left ICA are consistent with a 1-39% stenosis. Vertebrals:  Bilateral vertebral arteries demonstrate antegrade flow. Subclavians: Normal flow hemodynamics were seen in bilateral subclavian              arteries. *See table(s) above for measurements and observations.  Electronically signed by Servando Snare MD on 09/08/2020 at  5:29:55 PM.    Final         Scheduled Meds: . albuterol  2 puff Inhalation Q6H  . apixaban  5 mg Oral BID  . chlorhexidine  15 mL Mouth Rinse BID  . Chlorhexidine Gluconate Cloth  6 each Topical Daily  . docusate sodium  100 mg Oral BID  . feeding supplement (ENSURE ENLIVE)  237 mL Oral TID BM  . hydrocortisone cream   Topical BID  . insulin aspart  0-15 Units Subcutaneous TID WC  . insulin aspart  0-5 Units Subcutaneous QHS  . insulin aspart  12 Units Subcutaneous TID WC  . insulin glargine  25 Units Subcutaneous BID  . linagliptin  5 mg Oral Daily  . mouth rinse  15 mL Mouth Rinse q12n4p  . melatonin  3 mg Oral QHS  . pantoprazole sodium  40 mg Per Tube QHS  . tamsulosin  0.4 mg Oral Daily   Continuous Infusions: . sodium chloride       LOS: 22 days   Time spent: 57mns Greater than 50% of this time was spent in counseling, explanation of diagnosis, planning of further management, and coordination of care.  I have personally reviewed and interpreted on  09/10/2020 daily labs, tele strips, imagings as discussed above under date review session and assessment and plans.  I reviewed all nursing notes, pharmacy notes, consultant notes,  vitals, pertinent old records  I have discussed plan of care as described above with RN , patient and family on 09/10/2020  Voice Recognition /Dragon dictation system was used to create this note, attempts have been made to correct errors. Please contact the author with questions and/or clarifications.   Florencia Reasons, MD PhD FACP [facility]  Available via Epic secure chat 7am-7pm for nonurgent issues Please page for urgent issues To page the attending provider between 7A-7P or the covering provider during after hours 7P-7A, please log into the web site [email or web address] and access using universal [place] password for that web site. If you do not have the password, please call the hospital operator.    09/10/2020, 2:31 PM

## 2020-09-11 DIAGNOSIS — U071 COVID-19: Secondary | ICD-10-CM | POA: Diagnosis not present

## 2020-09-11 DIAGNOSIS — Z794 Long term (current) use of insulin: Secondary | ICD-10-CM

## 2020-09-11 DIAGNOSIS — E119 Type 2 diabetes mellitus without complications: Secondary | ICD-10-CM | POA: Diagnosis not present

## 2020-09-11 DIAGNOSIS — J9601 Acute respiratory failure with hypoxia: Secondary | ICD-10-CM | POA: Diagnosis not present

## 2020-09-11 LAB — GLUCOSE, CAPILLARY
Glucose-Capillary: 209 mg/dL — ABNORMAL HIGH (ref 70–99)
Glucose-Capillary: 168 mg/dL — ABNORMAL HIGH (ref 70–99)
Glucose-Capillary: 74 mg/dL (ref 70–99)
Glucose-Capillary: 175 mg/dL — ABNORMAL HIGH (ref 70–99)

## 2020-09-11 LAB — CBC WITH DIFFERENTIAL/PLATELET
Abs Immature Granulocytes: 0.52 10*3/uL — ABNORMAL HIGH (ref 0.00–0.07)
Basophils Absolute: 0.1 10*3/uL (ref 0.0–0.1)
Basophils Relative: 1 %
Eosinophils Absolute: 0.7 10*3/uL — ABNORMAL HIGH (ref 0.0–0.5)
Eosinophils Relative: 10 %
HCT: 34.2 % — ABNORMAL LOW (ref 39.0–52.0)
Hemoglobin: 10.7 g/dL — ABNORMAL LOW (ref 13.0–17.0)
Immature Granulocytes: 8 %
Lymphocytes Relative: 17 %
Lymphs Abs: 1.2 10*3/uL (ref 0.7–4.0)
MCH: 28.5 pg (ref 26.0–34.0)
MCHC: 31.3 g/dL (ref 30.0–36.0)
MCV: 91 fL (ref 80.0–100.0)
Monocytes Absolute: 0.5 10*3/uL (ref 0.1–1.0)
Monocytes Relative: 8 %
Neutro Abs: 4 10*3/uL (ref 1.7–7.7)
Neutrophils Relative %: 56 %
Platelets: 261 10*3/uL (ref 150–400)
RBC: 3.76 MIL/uL — ABNORMAL LOW (ref 4.22–5.81)
RDW: 17.1 % — ABNORMAL HIGH (ref 11.5–15.5)
WBC: 6.9 10*3/uL (ref 4.0–10.5)
nRBC: 1.2 % — ABNORMAL HIGH (ref 0.0–0.2)

## 2020-09-11 LAB — BASIC METABOLIC PANEL
Anion gap: 9 (ref 5–15)
BUN: 17 mg/dL (ref 8–23)
CO2: 32 mmol/L (ref 22–32)
Calcium: 8.5 mg/dL — ABNORMAL LOW (ref 8.9–10.3)
Chloride: 89 mmol/L — ABNORMAL LOW (ref 98–111)
Creatinine, Ser: 0.51 mg/dL — ABNORMAL LOW (ref 0.61–1.24)
GFR, Estimated: >60 mL/min (ref >60)
Glucose, Bld: 207 mg/dL — ABNORMAL HIGH (ref 70–99)
Potassium: 4.8 mmol/L (ref 3.5–5.1)
Sodium: 130 mmol/L — ABNORMAL LOW (ref 135–145)

## 2020-09-11 LAB — LIPID PANEL
Cholesterol: 167 mg/dL (ref 0–200)
HDL: 45 mg/dL (ref >40)
LDL Cholesterol: 109 mg/dL — ABNORMAL HIGH (ref 0–99)
Total CHOL/HDL Ratio: 3.7 RATIO
Triglycerides: 65 mg/dL (ref <150)
VLDL: 13 mg/dL (ref 0–40)

## 2020-09-11 LAB — C-REACTIVE PROTEIN: CRP: 0.8 mg/dL (ref <1.0)

## 2020-09-11 LAB — CORTISOL: Cortisol, Plasma: 20.6 ug/dL

## 2020-09-11 LAB — SEDIMENTATION RATE: Sed Rate: 53 mm/hr — ABNORMAL HIGH (ref 0–16)

## 2020-09-11 LAB — OSMOLALITY: Osmolality: 283 mOsm/kg (ref 275–295)

## 2020-09-11 MED ORDER — ZOLPIDEM TARTRATE 5 MG PO TABS
10.0000 mg | ORAL_TABLET | Freq: Every evening | ORAL | Status: DC | PRN
  Administered 2020-09-11 – 2020-09-28 (×17): 10 mg via ORAL
  Filled 2020-09-11 (×17): qty 2

## 2020-09-11 MED ORDER — INSULIN ASPART 100 UNIT/ML ~~LOC~~ SOLN
15.0000 [IU] | Freq: Three times a day (TID) | SUBCUTANEOUS | Status: DC
  Administered 2020-09-12 – 2020-09-28 (×47): 15 [IU] via SUBCUTANEOUS

## 2020-09-11 MED ORDER — PANTOPRAZOLE SODIUM 40 MG PO TBEC
40.0000 mg | DELAYED_RELEASE_TABLET | Freq: Every day | ORAL | Status: DC
  Administered 2020-09-11 – 2020-09-28 (×18): 40 mg via ORAL
  Filled 2020-09-11 (×18): qty 1

## 2020-09-11 NOTE — Progress Notes (Signed)
RT called by RN at request of pt wanting to be placed on bipap. Upon arrival to pt room pt sitting on side of bed working with therapy. No distress noted. Pt remains on Heated High Flow nasal cannula 15L 40%. RT will continue to monitor.

## 2020-09-11 NOTE — Progress Notes (Signed)
PROGRESS NOTE    Tim Day  PQZ:[id number] DOB: Dec 18, 1950 DOA: 08/19/2020 PCP: Celene Squibb, MD    Brief Narrative:  69 year old gentleman with history of hypertension, type 2 diabetes on insulin, history of DVT, COVID-19 pneumonia diagnosed on 8/25 treated with various regimen, recurrent hospitalization with hypoxic failure, Readmitted on 9/18 with increasing oxygen requirement from home and found to have left pneumothorax and since then remains in the hospital with high oxygen requirement. Significant event includes recurrent urinary retention needing second time Foley catheter placement.   Assessment & Plan:   Active Problems:   DM2 (diabetes mellitus, type 2) ([place])   Benign essential HTN   Dyslipidemia   Chest tube in place   COVID-19 virus infection   Acute hypoxemic respiratory failure due to COVID-19 [facility])   Pneumothorax, left   Pneumonia due to COVID-19 virus   Leg DVT (deep venous thromboembolism), acute, bilateral (HCC)   Pressure injury of skin   Acute respiratory failure with hypoxia ([place])   HCAP (healthcare-associated pneumonia)   Primary spontaneous pneumothorax   Protein-calorie malnutrition, severe  Acute hypoxemic respite failure due to COVID-19 virus infection, left pneumothorax: Persistent hypoxemia on high flow oxygen. SpO2: 95 % O2 Flow Rate (L/min): (S) 15 L/min FiO2 (%): (S) 40 %  Patient has finished therapeutic for COVID-19, diagnosed on 8/25. Left pneumothorax, chest tube removed. Poor lung expansion, he is only drawing 100 cc on incentive spirometry. Continue aggressive chest physiotherapy and mobility. Currently on 15 L oxygen and using BiPAP at night.  Type 2 diabetes on insulin: Patient on insulin glipizide and Metformin at home.  Currently remains on long-acting insulin 25 units twice a day, prandial insulin 12 units with meals and sliding scale insulin.  Oral hypoglycemics on hold.  Blood sugar slightly elevated, will keep on  current doses today.  Recurrent acute urinary retention: Second episode of retention needing Foley catheterization. Please do not remove until patient has achieved good mobility.  Acute cystitis: Treated with antibiotics.  Resolved.  Bilateral lower extremity DVT: Therapeutic on Eliquis.  Anxiety: As needed pendulous.  He is needing some medicine to help sleep.  Continue melatonin and Ambien.  Hyponatremia: Stable.    Unilateral vision loss: Resolved.  Neurological exam negative.    DVT prophylaxis: SCDs Start: 08/19/20 1842 apixaban (ELIQUIS) tablet 5 mg   Code Status: Full code Family Communication: Wife at the bedside Disposition Plan: Status is: Inpatient  Remains inpatient appropriate because:Inpatient level of care appropriate due to severity of illness   Dispo: The patient is from: Home              Anticipated d/c is to: CIR              Anticipated d/c date is: 3 days              Patient currently is not medically stable to d/c.         Consultants:   PCCM  Procedures:   Chest tube placement, removed  Antimicrobials:   Multiple, completed   Subjective: Patient seen and examined.  Wife at the bedside.  Denied any overnight events.  He could not sleep last night.  Was wanting to have increasing dose of Ambien.  Denies any chest pain.  No shortness of breath at rest. He is currently on 15 L heated high flow and saturating 97%.  He was excited to work with therapies last week.  Appetite is fair.  Objective: Vitals:   09/11/20  2993 09/11/20 0257 09/11/20 0632 09/11/20 0801  BP:   (!) 141/88   Pulse:  (!) 101 95   Resp:  (!) 31 (!) 32 (!) 33  Temp:   97.7 F (36.5 C)   TempSrc:   Oral   SpO2: 99% 99% 95% 95%  Weight:   81.9 kg   Height:        Intake/Output Summary (Last 24 hours) at 09/11/2020 0823 Last data filed at 09/11/2020 0643 Gross per 24 hour  Intake --  Output 2600 ml  Net -2600 ml   Filed Weights   09/09/20 0451 09/10/20 0529  09/11/20 7169  Weight: 79.9 kg 83.1 kg 81.9 kg    Examination:  General exam: Appears calm and comfortable, chronically sick looking on high flow oxygen.  Not in any distress. Respiratory system: Poor bilateral air entry.  No added sounds. Cardiovascular system: S1 & S2 heard, RRR. No JVD, murmurs, rubs, gallops or clicks. No pedal edema. Gastrointestinal system: Abdomen is nondistended, soft and nontender. No organomegaly or masses felt. Normal bowel sounds heard. Central nervous system: Alert and oriented. No focal neurological deficits. Extremities: Symmetric 5 x 5 power. Skin: No rashes, lesions or ulcers Psychiatry: Judgement and insight appear normal. Mood & affect anxious. Foley catheter with clear urine.    Data Reviewed: I have personally reviewed following labs and imaging studies  CBC: Recent Labs  Lab 09/06/20 0202 09/07/20 0356 09/08/20 0215 09/09/20 0532 09/11/20 0325  WBC 7.6 9.1 9.4 7.2 6.9  NEUTROABS  --   --   --   --  4.0  HGB 11.8* 11.7* 12.1* 11.7* 10.7*  HCT 36.9* 37.8* 38.5* 37.8* 34.2*  MCV 92.0 92.4 91.7 92.4 91.0  PLT 185 227 208 235 678   Basic Metabolic Panel: Recent Labs  Lab 09/07/20 0356 09/08/20 0215 09/09/20 0532 09/10/20 0158 09/11/20 0325  NA 131* 129* 131* 129* 130*  K 5.4* 5.1 4.9 4.7 4.8  CL 93* 91* 90* 89* 89*  CO2 28 29 33* 34* 32  GLUCOSE 160* 195* 130* 196* 207*  BUN 17 18 17 19 17   CREATININE 0.62 0.56* 0.58* 0.57* 0.51*  CALCIUM 8.2* 8.2* 8.2* 8.1* 8.5*   GFR: Estimated Creatinine Clearance: 92.5 mL/min (A) (by C-G formula based on SCr of 0.51 mg/dL (L)). Liver Function Tests: No results for input(s): AST, ALT, ALKPHOS, BILITOT, PROT, ALBUMIN in the last 168 hours. No results for input(s): LIPASE, AMYLASE in the last 168 hours. No results for input(s): AMMONIA in the last 168 hours. Coagulation Profile: No results for input(s): INR, PROTIME in the last 168 hours. Cardiac Enzymes: No results for input(s): CKTOTAL,  CKMB, CKMBINDEX, TROPONINI in the last 168 hours. BNP (last 3 results) No results for input(s): PROBNP in the last 8760 hours. HbA1C: No results for input(s): HGBA1C in the last 72 hours. CBG: Recent Labs  Lab 09/09/20 2124 09/10/20 0801 09/10/20 1136 09/10/20 1630 09/10/20 2057  GLUCAP 217* 240* 228* 250* 156*   Lipid Profile: Recent Labs    09/11/20 0325  CHOL 167  HDL 45  LDLCALC 109*  TRIG 65  CHOLHDL 3.7   Thyroid Function Tests: No results for input(s): TSH, T4TOTAL, FREET4, T3FREE, THYROIDAB in the last 72 hours. Anemia Panel: No results for input(s): VITAMINB12, FOLATE, FERRITIN, TIBC, IRON, RETICCTPCT in the last 72 hours. Sepsis Labs: No results for input(s): PROCALCITON, LATICACIDVEN in the last 168 hours.  No results found for this or any previous visit (from the past 240 hour(s)).  Radiology Studies: No results found.      Scheduled Meds: . albuterol  2 puff Inhalation Q6H  . apixaban  5 mg Oral BID  . chlorhexidine  15 mL Mouth Rinse BID  . Chlorhexidine Gluconate Cloth  6 each Topical Daily  . docusate sodium  100 mg Oral BID  . feeding supplement (ENSURE ENLIVE)  237 mL Oral TID BM  . hydrocortisone cream   Topical BID  . insulin aspart  0-15 Units Subcutaneous TID WC  . insulin aspart  0-5 Units Subcutaneous QHS  . insulin aspart  12 Units Subcutaneous TID WC  . insulin glargine  25 Units Subcutaneous BID  . linagliptin  5 mg Oral Daily  . mouth rinse  15 mL Mouth Rinse q12n4p  . melatonin  3 mg Oral QHS  . pantoprazole sodium  40 mg Per Tube QHS  . tamsulosin  0.4 mg Oral Daily   Continuous Infusions: . sodium chloride       LOS: 23 days    Time spent: 30 minutes    Barb Merino, MD [facility] Pager [phone]

## 2020-09-11 NOTE — Progress Notes (Signed)
Inpatient Diabetes Program Recommendations  AACE/ADA: New Consensus Statement on Inpatient Glycemic Control   Target Ranges:  Prepandial:   less than 140 mg/dL      Peak postprandial:   less than 180 mg/dL (1-2 hours)      Critically ill patients:  140 - 180 mg/dL  Results for ELVEN, LABOY (MRN [id number]) as of 09/11/2020 08:20  Ref. Range 09/11/2020 03:25  Glucose Latest Ref Range: 70 - 99 mg/dL 207 (H)   Results for SHAHAB, POLHAMUS (MRN [id number]) as of 09/11/2020 08:20  Ref. Range 09/10/2020 08:01 09/10/2020 11:36 09/10/2020 16:30 09/10/2020 20:57  Glucose-Capillary Latest Ref Range: 70 - 99 mg/dL 240 (H) 228 (H) 250 (H) 156 (H)   Review of Glycemic Control  Current orders for Inpatient glycemic control: Lantus 25 units BID, Novolog 12 units TID with meals, Novolog 0-15 units TID with meals, Novolog 0-5 units QHS, Tradjenta 5 mg daily  Inpatient Diabetes Program Recommendations:    Insulin: Please consider increasing meal coverage to Novolog 15 units TID with meals.  Thanks, Barnie Alderman, RN, MSN, CDE Diabetes Coordinator Inpatient Diabetes Program [phone] (Team Pager from 8am to 5pm)

## 2020-09-11 NOTE — Progress Notes (Signed)
Physical Therapy Treatment Patient Details Name: Sarkis Rhines MRN: [id number] DOB: [birth year] Today's Date: 09/11/2020    History of Present Illness 69 year old with hx of recent COVID pneumonia diagnosis (9/7-9/13, discharge on 2L O2), BL COVID induced DVTs, DM2, GERD HTN presenting with worsening SOB. Per chart found to have left pneumothorax and worsening bilateral infiltrates. Intubated 9/19-9/20     PT Comments    Patient received in bed, spouse present and very helpful in motivating patient and assisting in session. Continues to require heavy levels of physical assist overall, however able to tolerate sitting at EOB approximately 10-12 minutes, and also tolerated standing for approximately 30-60 seconds in stedy today! SPO2 on 15LPM HHFNC ranged from 86-93% dependent on anxiety levels, and he needed max cues for breathing technique during session. HR up to 115BPM max, RR as low as 31 with max cuing for technique but as high as 60 when left to his own devices. Left in bed positioned to comfort with spouse present and assisting patient. Very fatigued at EOS. Will continue to follow acutely.    Follow Up Recommendations  CIR     Equipment Recommendations  Other (comment) (defer to next venue)    Recommendations for Other Services       Precautions / Restrictions Precautions Precautions: Fall Precaution Comments: watch 02, watch HR, and RR, heated HF 02 Restrictions Weight Bearing Restrictions: No    Mobility  Bed Mobility Overal bed mobility: Needs Assistance Bed Mobility: Supine to Sit;Sit to Supine Rolling: Min guard   Supine to sit: Max assist Sit to supine: Mod assist   General bed mobility comments: MaxA to bring trunk up and around to EOB, then only ModA with return to supine for BLE management  Transfers Overall transfer level: Needs assistance Equipment used: Ambulation equipment used Transfers: Sit to/from Stand Sit to Stand: Min assist;+2  physical assistance;From elevated surface         General transfer comment: required multiple attempts and Min assist from therapist and his wife, up to flexed posture but unable to fully extend trunk and hips  Ambulation/Gait             General Gait Details: declined/fatigue   Stairs             Wheelchair Mobility    Modified Rankin (Stroke Patients Only)       Balance Overall balance assessment: Needs assistance Sitting-balance support: Bilateral upper extremity supported;Feet supported Sitting balance-Leahy Scale: Fair Sitting balance - Comments: close min guard for safety     Standing balance-Leahy Scale: Poor Standing balance comment: BUE support in stedy and min guard                            Cognition Arousal/Alertness: Awake/alert Behavior During Therapy: Anxious Overall Cognitive Status: Within Functional Limits for tasks assessed                                 General Comments: very anxious with mobility, instantly states "I can't breathe, I need to lay back down"      Exercises      General Comments General comments (skin integrity, edema, etc.): very anxious, SpO2 ranging from 86-93% on 15LPM HHFNC, max emotional support and encouragement from PT/spouse to stand. HR up to 115BPM with standing.      Pertinent Vitals/Pain Pain Assessment: Faces Faces Pain  Scale: Hurts a little bit Pain Location: generalized Pain Descriptors / Indicators: Discomfort;Aching Pain Intervention(s): Limited activity within patient's tolerance;Monitored during session;Repositioned    Home Living                      Prior Function            PT Goals (current goals can now be found in the care plan section) Acute Rehab PT Goals Patient Stated Goal: none stated PT Goal Formulation: With patient Time For Goal Achievement: 09/08/20 Potential to Achieve Goals: Good Progress towards PT goals: Progressing toward  goals    Frequency    Min 3X/week      PT Plan Current plan remains appropriate    Co-evaluation              AM-PAC PT "6 Clicks" Mobility   Outcome Measure  Help needed turning from your back to your side while in a flat bed without using bedrails?: A Little Help needed moving from lying on your back to sitting on the side of a flat bed without using bedrails?: A Lot Help needed moving to and from a bed to a chair (including a wheelchair)?: Total Help needed standing up from a chair using your arms (e.g., wheelchair or bedside chair)?: Total Help needed to walk in hospital room?: Total Help needed climbing 3-5 steps with a railing? : Total 6 Click Score: 9    End of Session Equipment Utilized During Treatment: Oxygen Activity Tolerance: Patient limited by fatigue Patient left: in bed;with call bell/phone within reach;with bed alarm set;with family/visitor present Nurse Communication: Mobility status PT Visit Diagnosis: Other abnormalities of gait and mobility (R26.89);Muscle weakness (generalized) (M62.81);Difficulty in walking, not elsewhere classified (R26.2)     Time: 7510-2585 PT Time Calculation (min) (ACUTE ONLY): 28 min  Charges:  $Therapeutic Activity: 23-37 mins                     Windell Norfolk, DPT, PN1   Supplemental Physical Therapist [place]    Pager [phone] Acute Rehab Office [phone]

## 2020-09-12 ENCOUNTER — Inpatient Hospital Stay (HOSPITAL_COMMUNITY): Payer: 59

## 2020-09-12 DIAGNOSIS — U071 COVID-19: Secondary | ICD-10-CM | POA: Diagnosis not present

## 2020-09-12 DIAGNOSIS — J9601 Acute respiratory failure with hypoxia: Secondary | ICD-10-CM | POA: Diagnosis not present

## 2020-09-12 DIAGNOSIS — Z794 Long term (current) use of insulin: Secondary | ICD-10-CM | POA: Diagnosis not present

## 2020-09-12 DIAGNOSIS — E119 Type 2 diabetes mellitus without complications: Secondary | ICD-10-CM | POA: Diagnosis not present

## 2020-09-12 LAB — GLUCOSE, CAPILLARY
Glucose-Capillary: 243 mg/dL — ABNORMAL HIGH (ref 70–99)
Glucose-Capillary: 218 mg/dL — ABNORMAL HIGH (ref 70–99)
Glucose-Capillary: 223 mg/dL — ABNORMAL HIGH (ref 70–99)
Glucose-Capillary: 185 mg/dL — ABNORMAL HIGH (ref 70–99)

## 2020-09-12 MED ORDER — ALBUTEROL SULFATE HFA 108 (90 BASE) MCG/ACT IN AERS
2.0000 | INHALATION_SPRAY | Freq: Two times a day (BID) | RESPIRATORY_TRACT | Status: DC
  Administered 2020-09-13 – 2020-09-17 (×9): 2 via RESPIRATORY_TRACT

## 2020-09-12 NOTE — Progress Notes (Signed)
RT removed BIPAP per patient's request and placed patient back on HHFNC 15L/40%.  Patient tolerating HHFNC well at this time. RN is aware. RT will continue to monitor as needed.

## 2020-09-12 NOTE — Progress Notes (Signed)
Occupational Therapy Treatment Patient Details Name: Tim Day MRN: [id number] DOB: [birth year] Today's Date: 09/12/2020    History of present illness 69 year old with hx of recent COVID pneumonia diagnosis (9/7-9/13, discharge on 2L O2), BL COVID induced DVTs, DM2, GERD HTN presenting with worsening SOB. Per chart found to have left pneumothorax and worsening bilateral infiltrates. Intubated 9/19-9/20, Chest tube removed 9/28.    OT comments  Pt seen on 15L HHFNC 40% FiO2. Niece who is a RT at [employer] present for session. Able to progress EOB with Max A, stand with Stedy with Max A +2 and mobilize to recliner. Desat high 70s with exertion; Max HR 111; BP 165/84. Pt with increased RR into 84s however niece states RR has been in 60s all day unless pt is sleeping. Pt recovers SpO2 into high 80s/low 90s but states he feels "anxious". Nsg called and gave pt anxiety meds. Pt completed simple grooming tasks once settled in chair and encouraged to complete BLE exercises as tolerated. Educated pt/niece on importance of mobilizing OOB and increasing time in chair. Pt/family verbalized understanding. Pt will need extensive post acute rehab and will need to increase activity tolerance to participate in rehab at CIR. Will continue to follow acutely.  Follow Up Recommendations  CIR;Supervision/Assistance - 24 hour    Equipment Recommendations  3 in 1 bedside commode;Tub/shower seat;Wheelchair cushion (measurements OT);Wheelchair (measurements OT);Hospital bed    Recommendations for Other Services Rehab consult    Precautions / Restrictions Precautions Precautions: Fall Precaution Comments: watch 02, watch HR, and RR, heated HF 02       Mobility Bed Mobility Overal bed mobility: Needs Assistance   Rolling: Mod assist   Supine to sit: +2 for safety/equipment;Max assist     General bed mobility comments: A to bring both legs off bed the A to push up into sitting  Transfers Overall  transfer level: Needs assistance   Transfers: Sit to/from Stand Sit to Stand: Max assist;+2 physical assistance;From elevated surface         General transfer comment: Mod A from Stedy paddles    Balance     Sitting balance-Leahy Scale: Fair       Standing balance-Leahy Scale: Poor                             ADL either performed or assessed with clinical judgement   ADL Overall ADL's : Needs assistance/impaired     Grooming: Minimal assistance;Sitting   Upper Body Bathing: Moderate assistance;Sitting   Lower Body Bathing: Maximal assistance;Bed level   Upper Body Dressing : Maximal assistance;Sitting   Lower Body Dressing: Maximal assistance;Bed level       Toileting- Clothing Manipulation and Hygiene: Total assistance Toileting - Clothing Manipulation Details (indicate cue type and reason): rolled to place bedpan     Functional mobility during ADLs: +2 for physical assistance;Maximal assistance (use of [place])       Vision       Perception     Praxis      Cognition Arousal/Alertness: Awake/alert Behavior During Therapy: Anxious;Flat affect Overall Cognitive Status: Impaired/Different from baseline Area of Impairment: Attention;Awareness;Safety/judgement;Problem solving                   Current Attention Level: [facility]       Awareness: Emergent Problem Solving: Slow processing;Decreased initiation General Comments: VC for sequencing; slow processing; will further assess  Exercises Exercises: Other exercises General Exercises - Lower Extremity Toe Raises: 15 reps;Seated Heel Raises: Seated;15 reps Other Exercises Other Exercises: pursed lip breathing   Shoulder Instructions       General Comments Niece ( RT at [facility]) present throughout session and encouraged pt     Pertinent Vitals/ Pain       Pain Assessment: No/denies pain  Home Living Family/patient expects to be discharged to:: Inpatient  rehab Living Arrangements: Spouse/significant other                                      Prior Functioning/Environment              Frequency  Min 2X/week        Progress Toward Goals  OT Goals(current goals can now be found in the care plan section)  Progress towards OT goals: Progressing toward goals  Acute Rehab OT Goals Patient Stated Goal: to get better OT Goal Formulation: With patient Time For Goal Achievement: 09/21/20 Potential to Achieve Goals: Fair ADL Goals Pt Will Perform Grooming: with supervision;sitting Pt Will Perform Upper Body Bathing: with supervision;sitting Pt Will Perform Lower Body Bathing: with modified independence Pt Will Perform Upper Body Dressing: with supervision;sitting Pt Will Transfer to Toilet: with min assist;stand pivot transfer;bedside commode Additional ADL Goal #1: pt will verbalize and demonstrate two energy conservation techniques  Plan Discharge plan remains appropriate    Co-evaluation                 AM-PAC OT "6 Clicks" Daily Activity     Outcome Measure   Help from another person eating meals?: A Little Help from another person taking care of personal grooming?: A Little Help from another person toileting, which includes using toliet, bedpan, or urinal?: Total Help from another person bathing (including washing, rinsing, drying)?: A Lot Help from another person to put on and taking off regular upper body clothing?: A Lot Help from another person to put on and taking off regular lower body clothing?: A Lot 6 Click Score: 13    End of Session Equipment Utilized During Treatment: Oxygen (15L 40% FiO2)  OT Visit Diagnosis: Unsteadiness on feet (R26.81);Other abnormalities of gait and mobility (R26.89);Muscle weakness (generalized) (M62.81);Other symptoms and signs involving cognitive function   Activity Tolerance Patient limited by fatigue   Patient Left in chair;with call bell/phone within  reach;with family/visitor present   Nurse Communication Mobility status;Need for lift equipment [facility]; request for anxiety meds)        Time: 1314-1400 OT Time Calculation (min): 46 min  Charges: OT General Charges $OT Visit: 1 Visit OT Treatments $Self Care/Home Management : 8-22 mins $Therapeutic Activity: 8-22 mins $Therapeutic Exercise: 8-22 mins  Maurie Boettcher, OT/L   Acute OT Clinical Specialist Acute Rehabilitation Services Pager [phone] Office [phone]    [facility] 09/12/2020, 2:17 PM

## 2020-09-12 NOTE — Progress Notes (Addendum)
Nutrition Follow-up  DOCUMENTATION CODES:   Severe malnutrition in context of acute illness/injury  INTERVENTION:    Continue Ensure Enlive po TID, each supplement provides 350 kcal and 20 grams of protein  Continue Magic cup TID with meals, each supplement provides 290 kcal and 9 grams of protein  NUTRITION DIAGNOSIS:   Severe Malnutrition related to acute illness (COVID19) as evidenced by mild fat depletion, mild muscle depletion, moderate muscle depletion, severe muscle depletion, moderate fat depletion, percent weight loss (13.3% weight loss in less than 1 month).  Ongoing  GOAL:   Patient will meet greater than or equal to 90% of their needs  Progressing  MONITOR:   TF tolerance, PO intake, Labs, Skin  REASON FOR ASSESSMENT:   Consult, Ventilator Enteral/tube feeding initiation and management  ASSESSMENT:   69 yo male admitted with recent COVID-19 diagnosis with worsening respiratory failure and left pneumothorax.  PMH includes DM, HTN, GERD.  Patient reports that he has been eating well; he has been eating all of his meals and drinking Ensure supplements TID. Appetite continues to be good. All meals include a magic cup supplement, which patient always eats.  Meal intakes recorded at 75-100%.  Average intake for the past 8 recorded meals is 97%. Currently on 15 L HFNC.  Labs reviewed.  CBG: 243-218  Medications reviewed and include Colace, Novolog, Lantus, Tradjenta, Flomax.  Weight stable.  Diet Order:   Diet Order            Diet Carb Modified Fluid consistency: Thin; Room service appropriate? Yes  Diet effective now                 EDUCATION NEEDS:   Not appropriate for education at this time  Skin:  Skin Assessment: Skin Integrity Issues: Skin Integrity Issues:: Stage II Stage I: N/A Stage II: bilateral buttocks  Last BM:  10/11 type 4  Height:   Ht Readings from Last 1 Encounters:  08/19/20 5\' 10"  (1.778 m)    Weight:   Wt  Readings from Last 1 Encounters:  09/12/20 80.7 kg    BMI:  Body mass index is 25.54 kg/m.  Estimated Nutritional Needs:   Kcal:  2200-2400  Protein:  110-130 gm  Fluid:  2-2.2 L    Lucas Mallow, RD, LDN, CNSC Please refer to Amion for contact information.

## 2020-09-12 NOTE — Progress Notes (Signed)
PROGRESS NOTE    Tim Day  XBD:[id number] DOB: 02/19/1951 DOA: 08/19/2020 PCP: Celene Squibb, MD    Brief Narrative:  69 year old gentleman with history of hypertension, type 2 diabetes on insulin, history of DVT, COVID-19 pneumonia diagnosed on 8/25 treated with various regimen, recurrent hospitalization with hypoxic failure, Readmitted on 9/18 with increasing oxygen requirement from home and found to have left pneumothorax and since then remains in the hospital with high oxygen requirement. Significant event includes recurrent urinary retention needing second time Foley catheter placement. Patient is vaccinated against COVID-19.   Assessment & Plan:   Active Problems:   DM2 (diabetes mellitus, type 2) ([place])   Benign essential HTN   Dyslipidemia   Chest tube in place   COVID-19 virus infection   Acute hypoxemic respiratory failure due to COVID-19 [facility])   Pneumothorax, left   Pneumonia due to COVID-19 virus   Leg DVT (deep venous thromboembolism), acute, bilateral (HCC)   Pressure injury of skin   Acute respiratory failure with hypoxia ([place])   HCAP (healthcare-associated pneumonia)   Primary spontaneous pneumothorax   Protein-calorie malnutrition, severe  Acute hypoxemic respite failure due to COVID-19 virus infection, left pneumothorax: Persistent hypoxemia on high flow oxygen. SpO2: 98 % O2 Flow Rate (L/min): 15 L/min FiO2 (%): 40 %  Patient has finished therapeutic for COVID-19, diagnosed on 8/25. Left pneumothorax, chest tube removed. Poor lung expansion,  Continue aggressive chest physiotherapy and mobility. Currently on 15 L oxygen and using BiPAP at night intermittently.  Type 2 diabetes on insulin: Patient on insulin glipizide and Metformin at home.  Currently remains on long-acting insulin 25 units twice a day, prandial insulin 12 units with meals and sliding scale insulin.  Oral hypoglycemics on hold.  Blood sugar slightly elevated, will keep on  current doses today.  Recurrent acute urinary retention: Second episode of retention needing Foley catheterization. Please do not remove until patient has achieved good mobility.  Acute cystitis: Treated with antibiotics.  Resolved.  Bilateral lower extremity DVT: Therapeutic on Eliquis.  Anxiety: Continue melatonin and Ambien.  Hyponatremia: Stable.    Unilateral vision loss: Resolved.  Neurological exam negative.    DVT prophylaxis: SCDs Start: 08/19/20 1842 apixaban (ELIQUIS) tablet 5 mg   Code Status: Full code Family Communication: Wife at the bedside Disposition Plan: Status is: Inpatient  Remains inpatient appropriate because:Inpatient level of care appropriate due to severity of illness   Dispo: The patient is from: Home              Anticipated d/c is to: CIR              Anticipated d/c date is: 3 days, when oxygen requirement improves.              Patient currently is not medically stable to d/c.         Consultants:   PCCM  Procedures:   Chest tube placement, removed 9/28.  Antimicrobials:   Multiple, completed   Subjective: Patient seen and examined.  Wife at the bedside.  No overnight events.  Slept well with increasing dose of Ambien.  Used BiPAP half the night and then he stayed on high flow oxygen. Still with very poor respiratory effort.   Objective: Vitals:   09/12/20 0326 09/12/20 0418 09/12/20 0807 09/12/20 1119  BP:  129/72 134/77 119/68  Pulse: (!) 111 (!) 104 (!) 106 (!) 101  Resp: 19 (!) 25 (!) 25 (!) 34  Temp:  99.6 F (37.6  C) 98.6 F (37 C) 98.5 F (36.9 C)  TempSrc:  Axillary Oral Oral  SpO2:  94% 98% 98%  Weight:  80.7 kg    Height:        Intake/Output Summary (Last 24 hours) at 09/12/2020 1256 Last data filed at 09/12/2020 0900 Gross per 24 hour  Intake 444 ml  Output 2100 ml  Net -1656 ml   Filed Weights   09/10/20 0529 09/11/20 0632 09/12/20 0418  Weight: 83.1 kg 81.9 kg 80.7 kg     Examination:  General exam: Patient is slightly anxious today, he is in mild distress on mobility.  Extremely debilitated and not able to turn around. respiratory system: Poor bilateral air entry.  No added sounds.  Drawing 100 cc on incentive spirometry. Cardiovascular system: S1 & S2 heard, RRR. No JVD, murmurs, rubs, gallops or clicks. No pedal edema. Gastrointestinal system: Abdomen is nondistended, soft and nontender. No organomegaly or masses felt. Normal bowel sounds heard. Central nervous system: Alert and oriented. No focal neurological deficits. Extremities: Symmetric 5 x 5 power. Skin: Stage II decubitus ulcer sacrum. Psychiatry: Judgement and insight appear normal. Mood & affect anxious. Foley catheter with clear urine.    Data Reviewed: I have personally reviewed following labs and imaging studies  CBC: Recent Labs  Lab 09/06/20 0202 09/07/20 0356 09/08/20 0215 09/09/20 0532 09/11/20 0325  WBC 7.6 9.1 9.4 7.2 6.9  NEUTROABS  --   --   --   --  4.0  HGB 11.8* 11.7* 12.1* 11.7* 10.7*  HCT 36.9* 37.8* 38.5* 37.8* 34.2*  MCV 92.0 92.4 91.7 92.4 91.0  PLT 185 227 208 235 867   Basic Metabolic Panel: Recent Labs  Lab 09/07/20 0356 09/08/20 0215 09/09/20 0532 09/10/20 0158 09/11/20 0325  NA 131* 129* 131* 129* 130*  K 5.4* 5.1 4.9 4.7 4.8  CL 93* 91* 90* 89* 89*  CO2 28 29 33* 34* 32  GLUCOSE 160* 195* 130* 196* 207*  BUN 17 18 17 19 17   CREATININE 0.62 0.56* 0.58* 0.57* 0.51*  CALCIUM 8.2* 8.2* 8.2* 8.1* 8.5*   GFR: Estimated Creatinine Clearance: 92.5 mL/min (A) (by C-G formula based on SCr of 0.51 mg/dL (L)). Liver Function Tests: No results for input(s): AST, ALT, ALKPHOS, BILITOT, PROT, ALBUMIN in the last 168 hours. No results for input(s): LIPASE, AMYLASE in the last 168 hours. No results for input(s): AMMONIA in the last 168 hours. Coagulation Profile: No results for input(s): INR, PROTIME in the last 168 hours. Cardiac Enzymes: No results  for input(s): CKTOTAL, CKMB, CKMBINDEX, TROPONINI in the last 168 hours. BNP (last 3 results) No results for input(s): PROBNP in the last 8760 hours. HbA1C: No results for input(s): HGBA1C in the last 72 hours. CBG: Recent Labs  Lab 09/11/20 1159 09/11/20 1715 09/11/20 2203 09/12/20 0805 09/12/20 1117  GLUCAP 168* 74 175* 243* 218*   Lipid Profile: Recent Labs    09/11/20 0325  CHOL 167  HDL 45  LDLCALC 109*  TRIG 65  CHOLHDL 3.7   Thyroid Function Tests: No results for input(s): TSH, T4TOTAL, FREET4, T3FREE, THYROIDAB in the last 72 hours. Anemia Panel: No results for input(s): VITAMINB12, FOLATE, FERRITIN, TIBC, IRON, RETICCTPCT in the last 72 hours. Sepsis Labs: No results for input(s): PROCALCITON, LATICACIDVEN in the last 168 hours.  No results found for this or any previous visit (from the past 240 hour(s)).       Radiology Studies: DG CHEST PORT 1 VIEW  Result Date: 09/12/2020  CLINICAL DATA:  Pneumonia. EXAM: PORTABLE CHEST 1 VIEW COMPARISON:  Prior chest radiograph 08/29/2020 and earlier. FINDINGS: A previously demonstrated enteric tube is no longer appreciated. Shallow inspiration radiograph. Unchanged cardiomegaly. New from the prior exam, there is patchy airspace disease within the left upper lobe. Redemonstrated bibasilar opacities which may reflect atelectasis or pneumonia. Small bilateral pleural effusions are difficult to exclude. No evidence of pneumothorax. No acute bony abnormality. Air distention of the stomach. IMPRESSION: Shallow inspiration radiograph. Progressive patchy airspace disease within the left upper lobe. Similar appearance of bibasilar opacities which may reflect atelectasis or pneumonia. Small bilateral pleural effusions are difficult to exclude. Interval removal of a previously demonstrated enteric tube. Air distension of the stomach. Electronically Signed   By: Kellie Simmering DO   On: 09/12/2020 10:40        Scheduled Meds: .  albuterol  2 puff Inhalation BID  . apixaban  5 mg Oral BID  . chlorhexidine  15 mL Mouth Rinse BID  . Chlorhexidine Gluconate Cloth  6 each Topical Daily  . docusate sodium  100 mg Oral BID  . feeding supplement (ENSURE ENLIVE)  237 mL Oral TID BM  . hydrocortisone cream   Topical BID  . insulin aspart  0-15 Units Subcutaneous TID WC  . insulin aspart  0-5 Units Subcutaneous QHS  . insulin aspart  15 Units Subcutaneous TID WC  . insulin glargine  25 Units Subcutaneous BID  . linagliptin  5 mg Oral Daily  . mouth rinse  15 mL Mouth Rinse q12n4p  . melatonin  3 mg Oral QHS  . pantoprazole  40 mg Oral Daily  . tamsulosin  0.4 mg Oral Daily   Continuous Infusions: . sodium chloride       LOS: 24 days    Time spent: 30 minutes    Barb Merino, MD [facility] Pager [phone]

## 2020-09-13 DIAGNOSIS — U071 COVID-19: Secondary | ICD-10-CM | POA: Diagnosis not present

## 2020-09-13 DIAGNOSIS — E119 Type 2 diabetes mellitus without complications: Secondary | ICD-10-CM | POA: Diagnosis not present

## 2020-09-13 DIAGNOSIS — J9601 Acute respiratory failure with hypoxia: Secondary | ICD-10-CM | POA: Diagnosis not present

## 2020-09-13 DIAGNOSIS — Z794 Long term (current) use of insulin: Secondary | ICD-10-CM | POA: Diagnosis not present

## 2020-09-13 LAB — GLUCOSE, CAPILLARY
Glucose-Capillary: 152 mg/dL — ABNORMAL HIGH (ref 70–99)
Glucose-Capillary: 237 mg/dL — ABNORMAL HIGH (ref 70–99)
Glucose-Capillary: 229 mg/dL — ABNORMAL HIGH (ref 70–99)
Glucose-Capillary: 123 mg/dL — ABNORMAL HIGH (ref 70–99)

## 2020-09-13 NOTE — Progress Notes (Signed)
Physical Therapy Treatment Patient Details Name: Tim Day MRN: [id number] DOB: 01-03-1951 Today's Date: 09/13/2020    History of Present Illness 69 year old with hx of recent COVID pneumonia diagnosis (9/7-9/13, discharge on 2L O2), BL COVID induced DVTs, DM2, GERD HTN presenting with worsening SOB. Per chart found to have left pneumothorax and worsening bilateral infiltrates. Intubated 9/19-9/20, Chest tube removed 9/28.     PT Comments    Pt making gradual progress today.  He participated in multiple sit to stands with mod A of 2 for ADLs and transfers.  Pt required education/encouragement for increased endurance, breathing technique, and posture.  Pt fatigued easily and required frequent rest breaks.  Cont to progress as able.    Follow Up Recommendations  CIR     Equipment Recommendations  Rolling walker with 5" wheels;Wheelchair (measurements PT);3in1 (PT);Wheelchair cushion (measurements PT);Other (comment) (further assessment next venue)    Recommendations for Other Services       Precautions / Restrictions Precautions Precautions: Fall Precaution Comments: watch 02, watch HR, and RR, heated HF 02 Restrictions Weight Bearing Restrictions: No    Mobility  Bed Mobility Overal bed mobility: Needs Assistance Bed Mobility: Supine to Sit     Supine to sit: Mod assist;+2 for safety/equipment     General bed mobility comments: Increased time and cues for sequencing/technique; Assist to move legs, reach for rail, and the mod A to lift trunk  Transfers Overall transfer level: Needs assistance Equipment used: Ambulation equipment used Transfers: Sit to/from Stand Sit to Stand: Mod assist;+2 physical assistance         General transfer comment: Mod A x 2 to power up with cues for posture and to "tuck buttock";   stood from elevated bed x 1, recliner x 1, bsc x 3, and Stedy x 3  Ambulation/Gait                 Stairs              Wheelchair Mobility    Modified Rankin (Stroke Patients Only)       Balance Overall balance assessment: Needs assistance Sitting-balance support: Bilateral upper extremity supported;Feet supported Sitting balance-Leahy Scale: Poor Sitting balance - Comments: Requiring UE support and min A at EOB; tends to lean forward and rest on knees with forward head; facilitated and educated on posture to promote increased lung capacity   Standing balance support: Bilateral upper extremity supported Standing balance-Leahy Scale: Poor Standing balance comment: Requiring Stedy and min-mod A (of 2 for safety); pt performed multiple stands for transfers and ADLs; required total assist for ADLs in standing (had BM notified RN); Pt only able to stand for 10-20 seconds at a time with max encouragment                            Cognition Arousal/Alertness: Awake/alert Behavior During Therapy: Anxious;Flat affect Overall Cognitive Status: Impaired/Different from baseline Area of Impairment: Attention;Awareness;Safety/judgement;Problem solving                   Current Attention Level: Selective       Awareness: Emergent Problem Solving: Slow processing;Decreased initiation General Comments: VC for sequencing; slow processing      Exercises      General Comments General comments (skin integrity, edema, etc.): Pt was on 15 L 40% heated HFNC.  Sats decreased to 84% at lowest with activity requiring 1 min to recover to 90% and  2-3 mins to recover to 93% and pt willing to try again.      Pertinent Vitals/Pain Pain Assessment: No/denies pain    Home Living                      Prior Function            PT Goals (current goals can now be found in the care plan section) Acute Rehab PT Goals Patient Stated Goal: to get better PT Goal Formulation: With patient/family Time For Goal Achievement: 09/27/20 Potential to Achieve Goals: Good Progress towards PT  goals: Progressing toward goals    Frequency    Min 3X/week      PT Plan Current plan remains appropriate    Co-evaluation              AM-PAC PT "6 Clicks" Mobility   Outcome Measure  Help needed turning from your back to your side while in a flat bed without using bedrails?: A Little Help needed moving from lying on your back to sitting on the side of a flat bed without using bedrails?: A Lot Help needed moving to and from a bed to a chair (including a wheelchair)?: Total Help needed standing up from a chair using your arms (e.g., wheelchair or bedside chair)?: A Lot Help needed to walk in hospital room?: Total Help needed climbing 3-5 steps with a railing? : Total 6 Click Score: 10    End of Session Equipment Utilized During Treatment: Oxygen;Gait belt Activity Tolerance: Patient limited by fatigue Patient left: with call bell/phone within reach;with family/visitor present;in chair Nurse Communication: Mobility status;Need for lift equipment PT Visit Diagnosis: Other abnormalities of gait and mobility (R26.89);Muscle weakness (generalized) (M62.81);Difficulty in walking, not elsewhere classified (R26.2)     Time: 6144-3154 PT Time Calculation (min) (ACUTE ONLY): 35 min  Charges:  $Therapeutic Activity: 23-37 mins                     Abran Richard, PT Acute Rehab Services Pager [phone] [facility] [place] 09/13/2020, 11:54 AM

## 2020-09-13 NOTE — Plan of Care (Signed)
°  Problem: Activity: Goal: Ability to tolerate increased activity will improve Outcome: Progressing   Problem: Respiratory: Goal: Ability to maintain a clear airway and adequate ventilation will improve Outcome: Progressing   Problem: Role Relationship: Goal: Method of communication will improve Outcome: Progressing   Problem: Education: Goal: Knowledge of General Education information will improve Description: Including pain rating scale, medication(s)/side effects and non-pharmacologic comfort measures Outcome: Progressing   Problem: Clinical Measurements: Goal: Ability to maintain clinical measurements within normal limits will improve Outcome: Progressing Goal: Respiratory complications will improve Outcome: Progressing Goal: Cardiovascular complication will be avoided Outcome: Progressing

## 2020-09-13 NOTE — Progress Notes (Signed)
PROGRESS NOTE    Tim Day  OIZ:[id number] DOB: 11/15/1951 DOA: 08/19/2020 PCP: Celene Squibb, MD    Brief Narrative:  69 year old gentleman with history of hypertension, type 2 diabetes on insulin, history of DVT, COVID-19 pneumonia diagnosed on 8/25 treated with various regimen, recurrent hospitalization with hypoxic failure, Readmitted on 9/18 with increasing oxygen requirement from home and found to have left pneumothorax and since then remains in the hospital with high oxygen requirement. Significant event includes recurrent urinary retention needing second time Foley catheter placement. Patient is vaccinated against COVID-19.   Assessment & Plan:   Active Problems:   DM2 (diabetes mellitus, type 2) ([place])   Benign essential HTN   Dyslipidemia   Chest tube in place   COVID-19 virus infection   Acute hypoxemic respiratory failure due to COVID-19 [facility])   Pneumothorax, left   Pneumonia due to COVID-19 virus   Leg DVT (deep venous thromboembolism), acute, bilateral (HCC)   Pressure injury of skin   Acute respiratory failure with hypoxia ([place])   HCAP (healthcare-associated pneumonia)   Primary spontaneous pneumothorax   Protein-calorie malnutrition, severe  Acute hypoxemic respite failure due to COVID-19 virus infection, left pneumothorax: Persistent hypoxemia on high flow oxygen. SpO2: 96 % O2 Flow Rate (L/min): 15 L/min FiO2 (%): 40 %  Patient has finished therapeutic for COVID-19, diagnosed on 8/25. Left pneumothorax, chest tube removed 9/28 Poor lung expansion,  Continue aggressive chest physiotherapy and mobility. Currently on 15 L oxygen and using BiPAP at night intermittently.  Type 2 diabetes on insulin: Patient on insulin, glipizide and Metformin at home.  Currently remains on long-acting insulin 25 units twice a day, prandial insulin 12 units with meals and sliding scale insulin.  Oral hypoglycemics on hold.  Blood sugar slightly elevated, will keep on  current doses today.  Recurrent acute urinary retention: Second episode of retention needing Foley catheterization. Please do not remove until patient has achieved good mobility.  Acute cystitis: Treated with antibiotics.  Resolved.  Bilateral lower extremity DVT: Therapeutic on Eliquis.  Anxiety: Continue melatonin and Ambien.  Hyponatremia: Stable.    Unilateral vision loss: Resolved.  Neurological exam negative.    DVT prophylaxis: SCDs Start: 08/19/20 1842 apixaban (ELIQUIS) tablet 5 mg   Code Status: Full code Family Communication: Wife at the bedside Disposition Plan: Status is: Inpatient  Remains inpatient appropriate because:Inpatient level of care appropriate due to severity of illness   Dispo: The patient is from: Home              Anticipated d/c is to: CIR vs LTAC              Anticipated d/c date is: 3 days, when oxygen requirement improves.              Patient currently is not medically stable to d/c.         Consultants:   PCCM  Procedures:   Chest tube placement, removed 9/28.  Antimicrobials:   Multiple, completed   Subjective: Patient seen and examined. No overnight events. He slept well with increasing dose of Ambien and woke up fresh. He was able to use BiPAP all night without problem. Wife at the bedside. Patient very much encouraged because she was able to sit in chair yesterday. Remains in about 15 L of oxygen with 40% FiO2. Using BiPAP at night.  Objective: Vitals:   09/13/20 0428 09/13/20 0749 09/13/20 0830 09/13/20 1247  BP: 139/72 (!) 152/80  103/62  Pulse: 99 (!)  101  (!) 106  Resp: (!) 25 19  20   Temp: 98.5 F (36.9 C) 99.5 F (37.5 C)  99 F (37.2 C)  TempSrc: Oral Oral  Oral  SpO2: 99% 98% 97% 96%  Weight:      Height:        Intake/Output Summary (Last 24 hours) at 09/13/2020 1319 Last data filed at 09/13/2020 0900 Gross per 24 hour  Intake 666 ml  Output 2875 ml  Net -2209 ml   Filed Weights   09/10/20  0529 09/11/20 0632 09/12/20 0418  Weight: 83.1 kg 81.9 kg 80.7 kg    Examination:  General exam: Mildly anxious, no increased work of breathing at rest. Shortness of breath on minimal mobility. respiratory system: Poor bilateral air entry.  No added sounds. Very poor inspiratory effort. Cardiovascular system: S1 & S2 heard, RRR. No JVD, murmurs, rubs, gallops or clicks. No pedal edema. Gastrointestinal system: Abdomen is nondistended, soft and nontender. No organomegaly or masses felt. Normal bowel sounds heard. Central nervous system: Alert and oriented. No focal neurological deficits. Extremities: Symmetric 5 x 5 power. Skin: Stage II decubitus ulcer sacrum. Psychiatry: Judgement and insight appear normal. Mood & affect anxious. Foley catheter with clear urine.    Data Reviewed: I have personally reviewed following labs and imaging studies  CBC: Recent Labs  Lab 09/07/20 0356 09/08/20 0215 09/09/20 0532 09/11/20 0325  WBC 9.1 9.4 7.2 6.9  NEUTROABS  --   --   --  4.0  HGB 11.7* 12.1* 11.7* 10.7*  HCT 37.8* 38.5* 37.8* 34.2*  MCV 92.4 91.7 92.4 91.0  PLT 227 208 235 756   Basic Metabolic Panel: Recent Labs  Lab 09/07/20 0356 09/08/20 0215 09/09/20 0532 09/10/20 0158 09/11/20 0325  NA 131* 129* 131* 129* 130*  K 5.4* 5.1 4.9 4.7 4.8  CL 93* 91* 90* 89* 89*  CO2 28 29 33* 34* 32  GLUCOSE 160* 195* 130* 196* 207*  BUN 17 18 17 19 17   CREATININE 0.62 0.56* 0.58* 0.57* 0.51*  CALCIUM 8.2* 8.2* 8.2* 8.1* 8.5*   GFR: Estimated Creatinine Clearance: 92.5 mL/min (A) (by C-G formula based on SCr of 0.51 mg/dL (L)). Liver Function Tests: No results for input(s): AST, ALT, ALKPHOS, BILITOT, PROT, ALBUMIN in the last 168 hours. No results for input(s): LIPASE, AMYLASE in the last 168 hours. No results for input(s): AMMONIA in the last 168 hours. Coagulation Profile: No results for input(s): INR, PROTIME in the last 168 hours. Cardiac Enzymes: No results for input(s):  CKTOTAL, CKMB, CKMBINDEX, TROPONINI in the last 168 hours. BNP (last 3 results) No results for input(s): PROBNP in the last 8760 hours. HbA1C: No results for input(s): HGBA1C in the last 72 hours. CBG: Recent Labs  Lab 09/12/20 1117 09/12/20 1620 09/12/20 2134 09/13/20 0746 09/13/20 1118  GLUCAP 218* 223* 185* 152* 237*   Lipid Profile: Recent Labs    09/11/20 0325  CHOL 167  HDL 45  LDLCALC 109*  TRIG 65  CHOLHDL 3.7   Thyroid Function Tests: No results for input(s): TSH, T4TOTAL, FREET4, T3FREE, THYROIDAB in the last 72 hours. Anemia Panel: No results for input(s): VITAMINB12, FOLATE, FERRITIN, TIBC, IRON, RETICCTPCT in the last 72 hours. Sepsis Labs: No results for input(s): PROCALCITON, LATICACIDVEN in the last 168 hours.  No results found for this or any previous visit (from the past 240 hour(s)).       Radiology Studies: DG CHEST PORT 1 VIEW  Result Date: 09/12/2020 CLINICAL DATA:  Pneumonia. EXAM: PORTABLE CHEST 1 VIEW COMPARISON:  Prior chest radiograph 08/29/2020 and earlier. FINDINGS: A previously demonstrated enteric tube is no longer appreciated. Shallow inspiration radiograph. Unchanged cardiomegaly. New from the prior exam, there is patchy airspace disease within the left upper lobe. Redemonstrated bibasilar opacities which may reflect atelectasis or pneumonia. Small bilateral pleural effusions are difficult to exclude. No evidence of pneumothorax. No acute bony abnormality. Air distention of the stomach. IMPRESSION: Shallow inspiration radiograph. Progressive patchy airspace disease within the left upper lobe. Similar appearance of bibasilar opacities which may reflect atelectasis or pneumonia. Small bilateral pleural effusions are difficult to exclude. Interval removal of a previously demonstrated enteric tube. Air distension of the stomach. Electronically Signed   By: Kellie Simmering DO   On: 09/12/2020 10:40        Scheduled Meds: . albuterol  2 puff  Inhalation BID  . apixaban  5 mg Oral BID  . chlorhexidine  15 mL Mouth Rinse BID  . Chlorhexidine Gluconate Cloth  6 each Topical Daily  . docusate sodium  100 mg Oral BID  . feeding supplement  237 mL Oral TID BM  . hydrocortisone cream   Topical BID  . insulin aspart  0-15 Units Subcutaneous TID WC  . insulin aspart  0-5 Units Subcutaneous QHS  . insulin aspart  15 Units Subcutaneous TID WC  . insulin glargine  25 Units Subcutaneous BID  . linagliptin  5 mg Oral Daily  . mouth rinse  15 mL Mouth Rinse q12n4p  . melatonin  3 mg Oral QHS  . pantoprazole  40 mg Oral Daily  . tamsulosin  0.4 mg Oral Daily   Continuous Infusions: . sodium chloride       LOS: 25 days    Time spent: 30 minutes    Barb Merino, MD [facility] Pager [phone]

## 2020-09-13 NOTE — Progress Notes (Signed)
Placed patient on BIPAP for night time rest.

## 2020-09-14 DIAGNOSIS — J9601 Acute respiratory failure with hypoxia: Secondary | ICD-10-CM | POA: Diagnosis not present

## 2020-09-14 DIAGNOSIS — U071 COVID-19: Secondary | ICD-10-CM | POA: Diagnosis not present

## 2020-09-14 LAB — CBC WITH DIFFERENTIAL/PLATELET
Abs Immature Granulocytes: 0.3 10*3/uL — ABNORMAL HIGH (ref 0.00–0.07)
Basophils Absolute: 0 10*3/uL (ref 0.0–0.1)
Basophils Relative: 1 %
Eosinophils Absolute: 0.6 10*3/uL — ABNORMAL HIGH (ref 0.0–0.5)
Eosinophils Relative: 9 %
HCT: 34 % — ABNORMAL LOW (ref 39.0–52.0)
Hemoglobin: 10.7 g/dL — ABNORMAL LOW (ref 13.0–17.0)
Immature Granulocytes: 5 %
Lymphocytes Relative: 22 %
Lymphs Abs: 1.4 10*3/uL (ref 0.7–4.0)
MCH: 29.6 pg (ref 26.0–34.0)
MCHC: 31.5 g/dL (ref 30.0–36.0)
MCV: 93.9 fL (ref 80.0–100.0)
Monocytes Absolute: 0.4 10*3/uL (ref 0.1–1.0)
Monocytes Relative: 6 %
Neutro Abs: 3.7 10*3/uL (ref 1.7–7.7)
Neutrophils Relative %: 57 %
Platelets: 250 10*3/uL (ref 150–400)
RBC: 3.62 MIL/uL — ABNORMAL LOW (ref 4.22–5.81)
RDW: 17.6 % — ABNORMAL HIGH (ref 11.5–15.5)
WBC: 6.3 10*3/uL (ref 4.0–10.5)
nRBC: 1.1 % — ABNORMAL HIGH (ref 0.0–0.2)

## 2020-09-14 LAB — GLUCOSE, CAPILLARY
Glucose-Capillary: 154 mg/dL — ABNORMAL HIGH (ref 70–99)
Glucose-Capillary: 96 mg/dL (ref 70–99)
Glucose-Capillary: 238 mg/dL — ABNORMAL HIGH (ref 70–99)
Glucose-Capillary: 237 mg/dL — ABNORMAL HIGH (ref 70–99)

## 2020-09-14 LAB — MAGNESIUM: Magnesium: 1.8 mg/dL (ref 1.7–2.4)

## 2020-09-14 LAB — COMPREHENSIVE METABOLIC PANEL
ALT: 67 U/L — ABNORMAL HIGH (ref 0–44)
AST: 36 U/L (ref 15–41)
Albumin: 2.2 g/dL — ABNORMAL LOW (ref 3.5–5.0)
Alkaline Phosphatase: 65 U/L (ref 38–126)
Anion gap: 8 (ref 5–15)
BUN: 18 mg/dL (ref 8–23)
CO2: 34 mmol/L — ABNORMAL HIGH (ref 22–32)
Calcium: 8.1 mg/dL — ABNORMAL LOW (ref 8.9–10.3)
Chloride: 89 mmol/L — ABNORMAL LOW (ref 98–111)
Creatinine, Ser: 0.54 mg/dL — ABNORMAL LOW (ref 0.61–1.24)
GFR, Estimated: >60 mL/min (ref >60)
Glucose, Bld: 188 mg/dL — ABNORMAL HIGH (ref 70–99)
Potassium: 4.7 mmol/L (ref 3.5–5.1)
Sodium: 131 mmol/L — ABNORMAL LOW (ref 135–145)
Total Bilirubin: 0.6 mg/dL (ref 0.3–1.2)
Total Protein: 5.3 g/dL — ABNORMAL LOW (ref 6.5–8.1)

## 2020-09-14 MED ORDER — COLLAGENASE 250 UNIT/GM EX OINT
TOPICAL_OINTMENT | Freq: Two times a day (BID) | CUTANEOUS | Status: DC
  Administered 2020-09-14 – 2020-09-17 (×3): 1 via TOPICAL
  Filled 2020-09-14: qty 30

## 2020-09-14 MED ORDER — HYPROMELLOSE (GONIOSCOPIC) 2.5 % OP SOLN
2.0000 [drp] | Freq: Four times a day (QID) | OPHTHALMIC | Status: DC
  Administered 2020-09-14 – 2020-09-22 (×28): 2 [drp] via OPHTHALMIC
  Filled 2020-09-14: qty 15

## 2020-09-14 MED ORDER — POLYVINYL ALCOHOL 1.4 % OP SOLN
1.0000 [drp] | OPHTHALMIC | Status: DC | PRN
  Administered 2020-09-14 – 2020-09-16 (×2): 1 [drp] via OPHTHALMIC
  Filled 2020-09-14: qty 15

## 2020-09-14 NOTE — TOC Initial Note (Addendum)
Transition of Care [facility]) - Initial/Assessment Note    Patient Details  Name: Tim Day MRN: [id number] Date of Birth: 10/16/1951  Transition of [facility]) CM/SW Contact:    Trula Ore, [place] Phone Number: 09/14/2020, 4:57 PM  Clinical Narrative:                  CSW spoke with patients spouse Inez Catalina about discharge plans when patient is medically ready. Patients spouse declined SNF as a backup plan to CIR. Patients spouse is hopeful for patient to go to CIR when medically ready. No further questions reported at this time. CSW to continue to follow and assist with discharge planning needs.  Expected Discharge Plan: Skilled Nursing Facility Barriers to Discharge: Continued Medical Work up   Patient Goals and CMS Choice   CMS Medicare.gov Compare Post Acute Care list provided to:: Patient Represenative (must comment) (Spouse Inez Catalina) Choice offered to / list presented to : Spouse Inez Catalina)  Expected Discharge Plan and Services Expected Discharge Plan: Skilled Nursing Facility In-house Referral: Clinical Social Work Discharge Planning Services: CM Consult   Living arrangements for the past 2 months: [place]                                      Prior Living Arrangements/Services Living arrangements for the past 2 months: Single Family Home Lives with:: Self, Spouse Patient language and need for interpreter reviewed:: Yes Do you feel safe going back to the place where you live?: No   CIR  Need for Family Participation in Patient Care: Yes (Comment) Care giver support system in place?: Yes (comment)   Criminal Activity/Legal Involvement Pertinent to Current Situation/Hospitalization: No - Comment as needed  Activities of Daily Living Home Assistive Devices/Equipment: Oxygen ADL Screening (condition at time of admission) Patient's cognitive ability adequate to safely complete daily activities?: Yes Is the patient deaf or have difficulty  hearing?: No Does the patient have difficulty seeing, even when wearing glasses/contacts?: No Does the patient have difficulty concentrating, remembering, or making decisions?: No Patient able to express need for assistance with ADLs?: Yes Does the patient have difficulty dressing or bathing?: No Independently performs ADLs?: Yes (appropriate for developmental age) Does the patient have difficulty walking or climbing stairs?: Yes Weakness of Legs: Both Weakness of Arms/Hands: None  Permission Sought/Granted Permission sought to share information with : Case Manager, Family Supports, Customer service manager                Emotional Assessment       Orientation: : Oriented to Self, Oriented to Place, Oriented to  Time, Oriented to Situation (WDL) Alcohol / Substance Use: Not Applicable Psych Involvement: No (comment)  Admission diagnosis:  Hyponatremia [E87.1] Pneumothorax, left [J93.9] Acute respiratory failure with hypoxia ([place]) [J96.01] HCAP (healthcare-associated pneumonia) [J18.9] Pneumothorax [J93.9] Chest tube in place [Z96.89] COVID-19 virus infection [U07.1] Patient Active Problem List   Diagnosis Date Noted  . Protein-calorie malnutrition, severe 08/25/2020  . Primary spontaneous pneumothorax   . HCAP (healthcare-associated pneumonia)   . Pressure injury of skin 08/20/2020  . Acute respiratory failure with hypoxia ([place])   . Pneumothorax, left 08/19/2020  . Pneumonia due to COVID-19 virus 08/19/2020  . Leg DVT (deep venous thromboembolism), acute, bilateral ([place]) 08/19/2020  . Acute hypoxemic respiratory failure due to COVID-19 ([place]) 08/08/2020  . Sepsis ([place]) 08/08/2020  . Hyponatremia 08/08/2020  . Hypocalcemia 08/03/2020  .  Transaminitis 08/03/2020  . COVID-19 virus infection 08/02/2020  . Thrombocytopenia ([place]) 08/02/2020  . Leukopenia 08/02/2020  . Esophageal reflux   . Chest tube in place 01/22/2015  . Hypercholesteremia   . Hypertension   .  Cardiogenic shock ([place]) 01/20/2015  . DM2 (diabetes mellitus, type 2) ([place]) 01/20/2015  . Benign essential HTN 01/20/2015  . Dyslipidemia 01/20/2015   PCP:  Celene Squibb, MD Pharmacy:   [facility] [place], [place] [address] Phone: [phone] Fax: [phone]     Social Determinants of Health (SDOH) Interventions    Readmission Risk Interventions No flowsheet data found.

## 2020-09-14 NOTE — Progress Notes (Signed)
Occupational Therapy Treatment Patient Details Name: Tim Day MRN: [id number] DOB: 03-02-1951 Today's Date: 09/14/2020    History of present illness 69 year old with hx of recent COVID pneumonia diagnosis (9/7-9/13, discharge on 2L O2), BL COVID induced DVTs, DM2, GERD HTN presenting with worsening SOB. Per chart found to have left pneumothorax and worsening bilateral infiltrates. Intubated 9/19-9/20, Chest tube removed 9/28.    OT comments  Pt seen for OT follow up session with focus on ADL mobility progression. Pt making steady progress toward OT goals. Pt required 15L HHFNC throughout session. He was able to complete bed mobility with max A +2 and sit <> stands with mod A +2 progressing to min A in stedy frame. With sit <> stand, O2 sats dropped to 86% but improved quickly with cue for rest break and pursed lip breathing. Once up in the recliner, pt able to engage in grooming activities with set up assist. Noted pt to be anxious and feeling down this session- offered positive reflection on progress and played pts favorite music during session. CIR remains excellent d/c plan to progress ADLs and independence. Will continue to follow per POC listed below.   Follow Up Recommendations  CIR;Supervision/Assistance - 24 hour    Equipment Recommendations  3 in 1 bedside commode;Tub/shower seat;Wheelchair cushion (measurements OT);Wheelchair (measurements OT);Hospital bed    Recommendations for Other Services Rehab consult    Precautions / Restrictions Precautions Precautions: Fall Precaution Comments: [place] Restrictions Weight Bearing Restrictions: No       Mobility Bed Mobility Overal bed mobility: Needs Assistance Bed Mobility: Supine to Sit Rolling: Max assist;+2 for physical assistance   Supine to sit: Max assist;+2 for physical assistance     General bed mobility comments: maxAx2 for rolling fully onto R hip for placement of clean sacral foam pt with increased  fatigue today requiring maxAx2 for coming to seated EoB  Transfers Overall transfer level: Needs assistance Equipment used: Ambulation equipment used Transfers: Sit to/from Stand Sit to Stand: Mod assist;+2 physical assistance;Min assist         General transfer comment: modAx2 with pad assist to come to fully upright for placement of pads in Stedy, once in front of recliner minA fof power up from elevated surface    Balance Overall balance assessment: Needs assistance Sitting-balance support: Bilateral upper extremity supported;Feet supported Sitting balance-Leahy Scale: Poor Sitting balance - Comments: requires UE support and mod progressing to min guard A for static sitting    Standing balance support: Bilateral upper extremity supported Standing balance-Leahy Scale: Poor Standing balance comment: requires UE support on Stedy and min modA for stabilization                            ADL either performed or assessed with clinical judgement   ADL Overall ADL's : Needs assistance/impaired                         Toilet Transfer: Moderate assistance;+2 for physical assistance;+2 for safety/equipment Toilet Transfer Details (indicate cue type and reason): with use of stedy frame to stand         Functional mobility during ADLs: Moderate assistance;+2 for physical assistance;+2 for safety/equipment (use of stedy) General ADL Comments: coninued energy conservation education. Continues to require increased assist and cues to maintain pursed lip breathing with exertion     Vision Patient Visual Report: No change from baseline  Perception     Praxis      Cognition Arousal/Alertness: Awake/alert Behavior During Therapy: Anxious;Flat affect Overall Cognitive Status: Impaired/Different from baseline Area of Impairment: Attention;Awareness;Safety/judgement;Problem solving                   Current Attention Level: Selective      Safety/Judgement: Decreased awareness of safety Awareness: Emergent Problem Solving: Slow processing;Decreased initiation General Comments: pt continues to be anxious with movements, wife states that he appears quite depressed. Played pts favorite music and offered positive thinking. Pt requires increased time and cueing to problem solve basic tasks        Exercises Exercises: Other exercises Other Exercises Other Exercises: sat EoB with support for ~10 minutes Other Exercises: pectoral stretch x 1 min  Other Exercises: cervical PROM/Stretching ~2 min    Shoulder Instructions       General Comments     Pertinent Vitals/ Pain       Pain Assessment: Faces Faces Pain Scale: Hurts a little bit Pain Location: generalized Pain Descriptors / Indicators: Discomfort;Aching Pain Intervention(s): Limited activity within patient's tolerance;Monitored during session;Repositioned  Home Living                                          Prior Functioning/Environment              Frequency  Min 2X/week        Progress Toward Goals  OT Goals(current goals can now be found in the care plan section)  Progress towards OT goals: Progressing toward goals  Acute Rehab OT Goals Patient Stated Goal: to get better OT Goal Formulation: With patient Time For Goal Achievement: 09/26/20 Potential to Achieve Goals: Good  Plan Discharge plan remains appropriate    Co-evaluation    PT/OT/SLP Co-Evaluation/Treatment: Yes Reason for Co-Treatment: For patient/therapist safety;To address functional/ADL transfers PT goals addressed during session: Mobility/safety with mobility OT goals addressed during session: ADL's and self-care;Proper use of Adaptive equipment and DME;Strengthening/ROM      AM-PAC OT "6 Clicks" Daily Activity     Outcome Measure   Help from another person eating meals?: A Little Help from another person taking care of personal grooming?: A  Little Help from another person toileting, which includes using toliet, bedpan, or urinal?: A Lot Help from another person bathing (including washing, rinsing, drying)?: A Lot Help from another person to put on and taking off regular upper body clothing?: A Lot Help from another person to put on and taking off regular lower body clothing?: A Lot 6 Click Score: 14    End of Session Equipment Utilized During Treatment: Oxygen  OT Visit Diagnosis: Unsteadiness on feet (R26.81);Other abnormalities of gait and mobility (R26.89);Muscle weakness (generalized) (M62.81);Other symptoms and signs involving cognitive function   Activity Tolerance Patient tolerated treatment well   Patient Left in chair;with call bell/phone within reach;with family/visitor present   Nurse Communication Mobility status        Time: 1443-1540 OT Time Calculation (min): 36 min  Charges: OT General Charges $OT Visit: 1 Visit OT Treatments $Self Care/Home Management : 8-22 mins  Zenovia Jarred, MSOT, OTR/L [facility] Office Number: [phone] Pager: [phone]  Zenovia Jarred 09/14/2020, 1:59 PM

## 2020-09-14 NOTE — Progress Notes (Signed)
Physical Therapy Treatment Patient Details Name: Tim Day MRN: [id number] DOB: 1951-11-27 Today's Date: 09/14/2020    History of Present Illness 69 year old with hx of recent COVID pneumonia diagnosis (9/7-9/13, discharge on 2L O2), BL COVID induced DVTs, DM2, GERD HTN presenting with worsening SOB. Per chart found to have left pneumothorax and worsening bilateral infiltrates. Intubated 9/19-9/20, Chest tube removed 9/28.     PT Comments    Pt with increased fatigue today as he just had bath, however RN request pt transfer to chair so new air bed could be placed to reduce pressure injuries. Pt continues to be limited in safe mobility by decreased endurance in presence of COVID recovery, as well as weakness and balance deficits. Pt requires maxAx2 for bed mobility, and modAx2 for transfers. Pt able to sit EoB for ~10 min for cervical and chest ROM and stretching. D/c plans remain appropriate at this time. PT will continue to follow acutely.     Follow Up Recommendations  CIR     Equipment Recommendations  Rolling walker with 5" wheels;Wheelchair (measurements PT);3in1 (PT);Wheelchair cushion (measurements PT);Other (comment) (further assessment next venue)    Recommendations for Other Services       Precautions / Restrictions Precautions Precautions: Fall Precaution Comments: watch 02, watch HR, and RR, heated HF 02 Restrictions Weight Bearing Restrictions: No    Mobility  Bed Mobility Overal bed mobility: Needs Assistance Bed Mobility: Supine to Sit Rolling: Max assist;+2 for physical assistance   Supine to sit: Max assist;+2 for physical assistance     General bed mobility comments: maxAx2 for rolling fully onto R hip for placement of clean sacral foam pt with increased fatigue today requiring maxAx2 for coming to seated EoB  Transfers Overall transfer level: Needs assistance Equipment used: Ambulation equipment used Transfers: Sit to/from Stand Sit to  Stand: Mod assist;+2 physical assistance;Min assist         General transfer comment: modAx2 with pad assist to come to fully upright for placement of pads in Stedy, once in front of recliner minA fof power up from elevated surface  Ambulation/Gait             General Gait Details: declined/fatigue          Balance Overall balance assessment: Needs assistance Sitting-balance support: Bilateral upper extremity supported;Feet supported Sitting balance-Leahy Scale: Poor Sitting balance - Comments: requires UE support and mod progressing to min guard A for static sitting    Standing balance support: Bilateral upper extremity supported Standing balance-Leahy Scale: Poor Standing balance comment: requires UE support on Stedy and min modA for stabilization                             Cognition Arousal/Alertness: Awake/alert Behavior During Therapy: Anxious;Flat affect Overall Cognitive Status: Impaired/Different from baseline Area of Impairment: Attention;Awareness;Safety/judgement;Problem solving                   Current Attention Level: Selective       Awareness: Emergent Problem Solving: Slow processing;Decreased initiation General Comments: have not seen pt in a few weeks, but anxiety in much reduced VC for sequencing; slow processing      Exercises Other Exercises Other Exercises: sat EoB with support for ~10 minutes Other Exercises: pectoral stretch x 1 min  Other Exercises: cervical PROM/Stretching ~2 min     General Comments General comments (skin integrity, edema, etc.): Pt wife in room on entry, pt  on 15 L HHFNC FiO2 30%O2, SaO2 >92%O2 throughout session, pt with anxiety with positional change and increase in RR to 30s with cuing able to slow down and regain breath, while changing sacral foam pt noted to have two small pressure injuries covered in yellow slough, recommended to RN possible Santyl application rather that Butt Paste to aid in  enzymatic debridement of slough      Pertinent Vitals/Pain Pain Assessment: Faces Faces Pain Scale: Hurts a little bit Pain Location: generalized Pain Descriptors / Indicators: Discomfort;Aching Pain Intervention(s): Limited activity within patient's tolerance;Monitored during session;Repositioned           PT Goals (current goals can now be found in the care plan section) Acute Rehab PT Goals Patient Stated Goal: to get better PT Goal Formulation: With patient/family Time For Goal Achievement: 09/27/20 Potential to Achieve Goals: Good Progress towards PT goals: Progressing toward goals    Frequency    Min 3X/week      PT Plan Current plan remains appropriate    Co-evaluation PT/OT/SLP Co-Evaluation/Treatment: Yes Reason for Co-Treatment: To address functional/ADL transfers PT goals addressed during session: Mobility/safety with mobility        AM-PAC PT "6 Clicks" Mobility   Outcome Measure  Help needed turning from your back to your side while in a flat bed without using bedrails?: Total Help needed moving from lying on your back to sitting on the side of a flat bed without using bedrails?: Total Help needed moving to and from a bed to a chair (including a wheelchair)?: Total Help needed standing up from a chair using your arms (e.g., wheelchair or bedside chair)?: A Lot Help needed to walk in hospital room?: Total Help needed climbing 3-5 steps with a railing? : Total 6 Click Score: 7    End of Session Equipment Utilized During Treatment: Oxygen;Gait belt Activity Tolerance: Patient limited by fatigue Patient left: with call bell/phone within reach;with family/visitor present;in chair Nurse Communication: Mobility status;Need for lift equipment PT Visit Diagnosis: Other abnormalities of gait and mobility (R26.89);Muscle weakness (generalized) (M62.81);Difficulty in walking, not elsewhere classified (R26.2)     Time: 3762-8315 PT Time Calculation (min)  (ACUTE ONLY): 29 min  Charges:  $Therapeutic Exercise: 8-22 mins                     Michoel Kunin B. Migdalia Dk PT, DPT Acute Rehabilitation Services Pager [phone] Office [phone]    [place] 09/14/2020, 1:38 PM

## 2020-09-14 NOTE — Progress Notes (Signed)
PROGRESS NOTE    Tim Day  VZD:[id number]  DOB: 04/09/1951  DOA: 08/19/2020 PCP: Celene Squibb, MD Outpatient Specialists:   Hospital course:  69 year old with recent Covid pneumonia (September 7th through 29JJ) complicated by bilateral DVTs, was admitted 08/19/2020 with left pneumothorax requiring chest tube placement and subsequent intubation 08/20/2020.  He was extubated the following day but has persistent bilateral infiltrates is presently requiring ongoing high flow nasal cannula 10 to 15 L oxygen.  Hospital course is complicated by urinary retention presently on Foley catheter.  He also had an episode of sudden loss of vision in his right eye which has resolved.  Neurology work-up is negative.  Subjective:  Patient is without any complaints.  His affect is very very flat.  I asked him if he is depressed he just looks at me.  His attentive wife who is at bedside states he is very depressed.  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   Objective: Vitals:   09/14/20 0830 09/14/20 0950 09/14/20 1409 09/14/20 1459  BP:    110/64  Pulse: 100 (!) 102  (!) 101  Resp: (!) 31 (!) 32  (!) 39  Temp:  (!) 100.7 F (38.2 C)  98.5 F (36.9 C)  TempSrc:    Axillary  SpO2: 96% 95% 92% 94%  Weight:      Height:        Intake/Output Summary (Last 24 hours) at 09/14/2020 1512 Last data filed at 09/14/2020 0910 Gross per 24 hour  Intake 597 ml  Output 1800 ml  Net -1203 ml   Filed Weights   09/10/20 0529 09/11/20 0632 09/12/20 0418  Weight: 83.1 kg 81.9 kg 80.7 kg     Exam:  General: Tired appearing man on HFNC with very flat affect lying in bed with attentive wife at bedside Eyes: sclera anicteric, conjuctiva mild injection bilaterally CVS: S1-S2, regular  Respiratory:  decreased air entry bilaterally secondary to decreased inspiratory effort, rales at bases  GI:  NABS, soft, NT  LE: No edema.  Neuro: A/O x 3, grossly nonfocal.  Psych: patient admits to depressed mood and hopelessness.  Assessment & Plan:   Ongoing hypoxic respiratory failure secondary to COVID-19 status post left PTX Ongoing hypoxia is apparently thought to be secondary to poor lung expansion and  infiltrates Chest x-ray shows persistent patchy infiltrates LUL as well as bibasilar infiltrates versus atelectasis.  Per previous note he is apparently only having 100 cc on incentive spirometry Patient is on aggressive chest PT BiPAP at night Presently on 10 to 15 L O2 HFNC  Depression Patient is clearly depressed Will request inpatient psychiatry evaluation and medication management Patient's "anxiety" has been treated with melatonin and Ambien  Urinary retention Patient remains on Foley Has failed voiding trials x2 Recommendations are to keep Foley in place until acute illness is resolved Complicated by acute cystitis and treated with antibiotics.  Bilateral DVT Continue Eliquis  Protein calorie malnutrition Appreciate ongoing evaluatio and treatment n by nutrition  Sudden unilateral vision loss Resolved spontaneously MRI negative Would have low threshold for ophthalmology evaluation if this recurs    DVT prophylaxis: On Eliquis Code Status: Full Family Communication: Wife was at bedside Disposition Plan:   Patient is from: Home  Anticipated Discharge Location: CIR  Barriers to Discharge: Ongoing hypoxia  Is patient medically stable for Discharge: No   Consultants:  PCCM  Procedures:  Chest tube placement  Intubation  Antimicrobials:  None at present   Data Reviewed:  Basic Metabolic Panel: Recent Labs  Lab 09/08/20 0215 09/09/20 0532 09/10/20 0158 09/11/20 0325 09/14/20 0240  NA 129* 131* 129* 130* 131*  K 5.1 4.9 4.7 4.8 4.7  CL 91* 90* 89* 89* 89*  CO2 29 33* 34* 32 34*  GLUCOSE 195* 130* 196* 207* 188*  BUN 18 17 19 17 18     CREATININE 0.56* 0.58* 0.57* 0.51* 0.54*  CALCIUM 8.2* 8.2* 8.1* 8.5* 8.1*  MG  --   --   --   --  1.8   Liver Function Tests: Recent Labs  Lab 09/14/20 0240  AST 36  ALT 67*  ALKPHOS 65  BILITOT 0.6  PROT 5.3*  ALBUMIN 2.2*   No results for input(s): LIPASE, AMYLASE in the last 168 hours. No results for input(s): AMMONIA in the last 168 hours. CBC: Recent Labs  Lab 09/08/20 0215 09/09/20 0532 09/11/20 0325 09/14/20 0240  WBC 9.4 7.2 6.9 6.3  NEUTROABS  --   --  4.0 3.7  HGB 12.1* 11.7* 10.7* 10.7*  HCT 38.5* 37.8* 34.2* 34.0*  MCV 91.7 92.4 91.0 93.9  PLT 208 235 261 250   Cardiac Enzymes: No results for input(s): CKTOTAL, CKMB, CKMBINDEX, TROPONINI in the last 168 hours. BNP (last 3 results) No results for input(s): PROBNP in the last 8760 hours. CBG: Recent Labs  Lab 09/13/20 1118 09/13/20 1630 09/13/20 2109 09/14/20 0736 09/14/20 1055  GLUCAP 237* 229* 123* 154* 96    No results found for this or any previous visit (from the past 240 hour(s)).    Studies: No results found.   Scheduled Meds:  albuterol  2 puff Inhalation BID   apixaban  5 mg Oral BID   chlorhexidine  15 mL Mouth Rinse BID   Chlorhexidine Gluconate Cloth  6 each Topical Daily   collagenase   Topical BID   docusate sodium  100 mg Oral BID   feeding supplement  237 mL Oral TID BM   hydrocortisone cream   Topical BID   insulin aspart  0-15 Units Subcutaneous TID WC   insulin aspart  0-5 Units Subcutaneous QHS   insulin aspart  15 Units Subcutaneous TID WC   insulin glargine  25 Units Subcutaneous BID   linagliptin  5 mg Oral Daily   mouth rinse  15 mL Mouth Rinse q12n4p   melatonin  3 mg Oral QHS   pantoprazole  40 mg Oral Daily   tamsulosin  0.4 mg Oral Daily   Continuous Infusions:  sodium chloride      Active Problems:   DM2 (diabetes mellitus, type 2) (HCC)   Benign essential HTN   Dyslipidemia   Chest tube in place   COVID-19 virus infection    Acute hypoxemic respiratory failure due to COVID-19 [facility])   Pneumothorax, left   Pneumonia due to COVID-19 virus   Leg DVT (deep venous thromboembolism), acute, bilateral (HCC)   Pressure injury of skin   Acute respiratory failure with hypoxia ([place])   HCAP (healthcare-associated pneumonia)   Primary spontaneous pneumothorax   Protein-calorie malnutrition, severe     Kaelen Brennan Tublu Tashaun Obey, [facility]  If 7PM-7AM, please contact night-coverage [email or web address] Password [email or web address] 09/14/2020, 3:12 PM    LOS: 26 days

## 2020-09-15 DIAGNOSIS — F4322 Adjustment disorder with anxiety: Secondary | ICD-10-CM

## 2020-09-15 DIAGNOSIS — J9601 Acute respiratory failure with hypoxia: Secondary | ICD-10-CM | POA: Diagnosis not present

## 2020-09-15 DIAGNOSIS — U071 COVID-19: Secondary | ICD-10-CM | POA: Diagnosis not present

## 2020-09-15 LAB — COMPREHENSIVE METABOLIC PANEL
ALT: 67 U/L — ABNORMAL HIGH (ref 0–44)
AST: 33 U/L (ref 15–41)
Albumin: 2.2 g/dL — ABNORMAL LOW (ref 3.5–5.0)
Alkaline Phosphatase: 53 U/L (ref 38–126)
Anion gap: 9 (ref 5–15)
BUN: 15 mg/dL (ref 8–23)
CO2: 32 mmol/L (ref 22–32)
Calcium: 7.9 mg/dL — ABNORMAL LOW (ref 8.9–10.3)
Chloride: 91 mmol/L — ABNORMAL LOW (ref 98–111)
Creatinine, Ser: 0.53 mg/dL — ABNORMAL LOW (ref 0.61–1.24)
GFR, Estimated: >60 mL/min (ref >60)
Glucose, Bld: 114 mg/dL — ABNORMAL HIGH (ref 70–99)
Potassium: 4.5 mmol/L (ref 3.5–5.1)
Sodium: 132 mmol/L — ABNORMAL LOW (ref 135–145)
Total Bilirubin: 0.9 mg/dL (ref 0.3–1.2)
Total Protein: 5.5 g/dL — ABNORMAL LOW (ref 6.5–8.1)

## 2020-09-15 LAB — GLUCOSE, CAPILLARY
Glucose-Capillary: 96 mg/dL (ref 70–99)
Glucose-Capillary: 141 mg/dL — ABNORMAL HIGH (ref 70–99)
Glucose-Capillary: 114 mg/dL — ABNORMAL HIGH (ref 70–99)
Glucose-Capillary: 104 mg/dL — ABNORMAL HIGH (ref 70–99)

## 2020-09-15 NOTE — Progress Notes (Signed)
PROGRESS NOTE    Tim Day  RJJ:[id number]  DOB: Dec 09, 1950  DOA: 08/19/2020 PCP: Celene Squibb, MD Outpatient Specialists:   Hospital course:  69 year old with recent Covid pneumonia (September 7th through 01SW) complicated by bilateral DVTs, was admitted 08/19/2020 with left pneumothorax requiring chest tube placement and subsequent intubation 08/20/2020.  He was extubated the following day but has persistent bilateral infiltrates is presently requiring ongoing high flow nasal cannula 10 to 15 L oxygen.  Hospital course is complicated by urinary retention presently on Foley catheter.  He also had an episode of sudden loss of vision in his right eye which has resolved.  Neurology work-up is negative.  Subjective:  Patient is sleeping comfortably with HFNC in place.  Patient's wife is at bedside and she notes that he has not had any new acute complaints today.   Objective: Vitals:   09/15/20 0110 09/15/20 0453 09/15/20 0730 09/15/20 1518  BP:  121/72    Pulse: 97 97 79 (!) 110  Resp: (!) 29 (!) 83 (!) 22 20  Temp:  (!) 100.4 F (38 C)    TempSrc:  Oral    SpO2: 98% 90% 96% 100%  Weight:      Height:        Intake/Output Summary (Last 24 hours) at 09/15/2020 1731 Last data filed at 09/15/2020 1329 Gross per 24 hour  Intake 240 ml  Output 3200 ml  Net -2960 ml   Filed Weights   09/10/20 0529 09/11/20 0632 09/12/20 0418  Weight: 83.1 kg 81.9 kg 80.7 kg     Exam:  General: Patient lying comfortably at 20 degrees with HFNC in place and NARD  eyes: sclera anicteric, conjuctiva mild injection bilaterally CVS: S1-S2, regular  Respiratory:  decreased air entry bilaterally secondary to decreased inspiratory effort, rales at bases  GI: NABS, soft, NT  LE: No edema.  Neuro: A/O x 3, grossly nonfocal.  Psych: patient admits to depressed mood and hopelessness.  Assessment & Plan:   69 year old man treated for Covid pneumonia September 1-09 which was  complicated by bilateral DVTs was readmitted September 18 with left pneumothorax and brief intubation.  He has had a prolonged course since then with ongoing hypoxia requiring 15 L HFNC.  Ongoing hypoxic respiratory failure secondary to COVID-19 status post left PTX Ongoing hypoxia is apparently thought to be secondary to poor lung expansion and  infiltrates Chest x-ray shows persistent patchy infiltrates LUL as well as bibasilar infiltrates versus atelectasis.  Per previous note he is apparently only having 100 cc on incentive spirometry Patient is on aggressive chest PT BiPAP at night Presently on 15 L O2 HFNC--attempt to wean down oxygen were unsuccessful.  Patient was apparently desaturating to 88% on 10 L at rest.  Patient was placed back on 15 L.  Depression Appreciate psychiatric evaluation today. As needed hydroxyzine and Ativan per their recommendations.  Urinary retention Patient remains on Foley Has failed voiding trials x2 Recommendations are to keep Foley in place until acute illness is resolved Complicated by acute cystitis and treated with antibiotics.  Bilateral DVT Continue Eliquis  Protein calorie malnutrition Appreciate ongoing evaluatio and treatment n by nutrition  Sudden unilateral vision loss Resolved spontaneously MRI negative Would have low threshold for ophthalmology evaluation if this recurs    DVT prophylaxis: On Eliquis Code Status: Full Family Communication: Wife was at bedside Disposition Plan:   Patient is from: Home  Anticipated Discharge Location: CIR  Barriers to Discharge: Ongoing hypoxia  Is patient medically stable for Discharge: No   Consultants:  PCCM  Procedures:  Chest tube placement  Intubation  Antimicrobials:  None at present   Data Reviewed:  Basic Metabolic Panel: Recent Labs  Lab 09/09/20 0532 09/10/20 0158 09/11/20 0325 09/14/20 0240 09/15/20 0232  NA 131* 129* 130* 131* 132*  K 4.9 4.7 4.8 4.7 4.5    CL 90* 89* 89* 89* 91*  CO2 33* 34* 32 34* 32  GLUCOSE 130* 196* 207* 188* 114*  BUN 17 19 17 18 15   CREATININE 0.58* 0.57* 0.51* 0.54* 0.53*  CALCIUM 8.2* 8.1* 8.5* 8.1* 7.9*  MG  --   --   --  1.8  --    Liver Function Tests: Recent Labs  Lab 09/14/20 0240 09/15/20 0232  AST 36 33  ALT 67* 67*  ALKPHOS 65 53  BILITOT 0.6 0.9  PROT 5.3* 5.5*  ALBUMIN 2.2* 2.2*   No results for input(s): LIPASE, AMYLASE in the last 168 hours. No results for input(s): AMMONIA in the last 168 hours. CBC: Recent Labs  Lab 09/09/20 0532 09/11/20 0325 09/14/20 0240  WBC 7.2 6.9 6.3  NEUTROABS  --  4.0 3.7  HGB 11.7* 10.7* 10.7*  HCT 37.8* 34.2* 34.0*  MCV 92.4 91.0 93.9  PLT 235 261 250   Cardiac Enzymes: No results for input(s): CKTOTAL, CKMB, CKMBINDEX, TROPONINI in the last 168 hours. BNP (last 3 results) No results for input(s): PROBNP in the last 8760 hours. CBG: Recent Labs  Lab 09/14/20 1644 09/14/20 2105 09/15/20 0738 09/15/20 1202 09/15/20 1652  GLUCAP 238* 237* 96 141* 114*    No results found for this or any previous visit (from the past 240 hour(s)).    Studies: No results found.   Scheduled Meds:  albuterol  2 puff Inhalation BID   apixaban  5 mg Oral BID   chlorhexidine  15 mL Mouth Rinse BID   Chlorhexidine Gluconate Cloth  6 each Topical Daily   collagenase   Topical BID   docusate sodium  100 mg Oral BID   feeding supplement  237 mL Oral TID BM   hydrocortisone cream   Topical BID   hydroxypropyl methylcellulose / hypromellose  2 drop Both Eyes QID   insulin aspart  0-15 Units Subcutaneous TID WC   insulin aspart  0-5 Units Subcutaneous QHS   insulin aspart  15 Units Subcutaneous TID WC   insulin glargine  25 Units Subcutaneous BID   linagliptin  5 mg Oral Daily   mouth rinse  15 mL Mouth Rinse q12n4p   melatonin  3 mg Oral QHS   pantoprazole  40 mg Oral Daily   tamsulosin  0.4 mg Oral Daily   Continuous Infusions:  sodium  chloride      Active Problems:   DM2 (diabetes mellitus, type 2) (HCC)   Benign essential HTN   Dyslipidemia   Chest tube in place   COVID-19 virus infection   Acute hypoxemic respiratory failure due to COVID-19 ([place])   Pneumothorax, left   Pneumonia due to COVID-19 virus   Leg DVT (deep venous thromboembolism), acute, bilateral (HCC)   Pressure injury of skin   Acute respiratory failure with hypoxia (HCC)   HCAP (healthcare-associated pneumonia)   Primary spontaneous pneumothorax   Protein-calorie malnutrition, severe   Adjustment disorder with anxious mood     Jahleel Stroschein Derek Jack, [facility]  If 7PM-7AM, please contact night-coverage [email or web address] Password [email or web address] 09/15/2020, 5:31 PM    LOS: 27  days

## 2020-09-15 NOTE — Progress Notes (Signed)
Physical Therapy Treatment Patient Details Name: Tim Day MRN: [id number] DOB: May 11, 1951 Today's Date: 09/15/2020    History of Present Illness 69 year old with hx of recent COVID pneumonia diagnosis (9/7-9/13, discharge on 2L O2), BL COVID induced DVTs, DM2, GERD HTN presenting with worsening SOB. Per chart found to have left pneumothorax and worsening bilateral infiltrates. Intubated 9/19-9/20, Chest tube removed 9/28.     PT Comments    Focus of session functional mobility. Pt request bedpan prior to getting up. Able to roll R and L with mod A, for bedpan use.  With maxAx2 came to [place]. Pt requesting to return to bed for further use of bedpan. With encouragement pt agreeable to get to [facility]. Pt is mod A x2 for power up from low surfaces (bed and BSC) and min A for power up from higher Stedy pads. Focus on purse lip breathing and deep breathing to control improve oxygenation. Pt has been able to increase duration of PT sessions this week and progress his endurance.  Pt continues to be motivated to return home to family and he has good family support. PT continues to recommend CIR level rehab at discharge.    Follow Up Recommendations  CIR     Equipment Recommendations  Rolling walker with 5" wheels;Wheelchair (measurements PT);3in1 (PT);Wheelchair cushion (measurements PT);Other (comment) (further assessment next venue)       Precautions / Restrictions Precautions Precautions: Fall Precaution Comments: [place] Restrictions Weight Bearing Restrictions: No    Mobility  Bed Mobility Overal bed mobility: Needs Assistance Bed Mobility: Supine to Sit Rolling: Mod assist   Supine to sit: Max assist;+2 for physical assistance     General bed mobility comments: modA L and R for bedpan placement, maxAx2 to bring trunk to upright and manage hips to EoB  Transfers Overall transfer level: Needs assistance Equipment used: Ambulation equipment used Transfers: Sit to/from  Stand Sit to Stand: Mod assist;+2 physical assistance;Min assist         General transfer comment: pt performed total of  5 sit<>stand to transfer from bed to [facility], stand x3 for pericare and to get to recliner        Balance Overall balance assessment: Needs assistance Sitting-balance support: Bilateral upper extremity supported;Feet supported Sitting balance-Leahy Scale: Poor Sitting balance - Comments: requires UE support and mod progressing to min guard A for static sitting  Postural control: Right lateral lean Standing balance support: Bilateral upper extremity supported Standing balance-Leahy Scale: Poor Standing balance comment: requires UE support on Stedy and min modA for stabilization                             Cognition Arousal/Alertness: Awake/alert Behavior During Therapy: Anxious;Flat affect Overall Cognitive Status: Impaired/Different from baseline Area of Impairment: Attention;Awareness;Safety/judgement;Problem solving                   Current Attention Level: Selective     Safety/Judgement: Decreased awareness of safety Awareness: Emergent Problem Solving: Slow processing;Decreased initiation General Comments: Pt received anxiety medication prior to therapy, continues to be anxious with novel activities like getting up to [facility]      Exercises Other Exercises Other Exercises: sat on BSC for ~10 min Other Exercises: pulling forward on armrests while engaging core muscles to pull back off chair    General Comments General comments (skin integrity, edema, etc.): Wife in room, pt on 15L via [place] FiO2 30%O2, SaO2 dropped to low  80s with difficulty recovering during mobility, titrated up to 30L O2 via [place] for mobility and able to maintain 86-92%O2, once in recliner able to titrate back down to 15L HHFNC       Pertinent Vitals/Pain Pain Assessment: Faces Faces Pain Scale: Hurts a little bit Pain Location: generalized Pain Descriptors /  Indicators: Discomfort;Aching Pain Intervention(s): Limited activity within patient's tolerance;Monitored during session;Repositioned           PT Goals (current goals can now be found in the care plan section) Acute Rehab PT Goals Patient Stated Goal: to get better PT Goal Formulation: With patient/family Time For Goal Achievement: 09/27/20 Potential to Achieve Goals: Good Progress towards PT goals: Progressing toward goals    Frequency    Min 3X/week      PT Plan Current plan remains appropriate       AM-PAC PT "6 Clicks" Mobility   Outcome Measure  Help needed turning from your back to your side while in a flat bed without using bedrails?: Total Help needed moving from lying on your back to sitting on the side of a flat bed without using bedrails?: Total Help needed moving to and from a bed to a chair (including a wheelchair)?: Total Help needed standing up from a chair using your arms (e.g., wheelchair or bedside chair)?: A Lot Help needed to walk in hospital room?: Total Help needed climbing 3-5 steps with a railing? : Total 6 Click Score: 7    End of Session Equipment Utilized During Treatment: Oxygen;Gait belt Activity Tolerance: Patient limited by fatigue Patient left: with call bell/phone within reach;with family/visitor present;in chair Nurse Communication: Mobility status;Need for lift equipment PT Visit Diagnosis: Other abnormalities of gait and mobility (R26.89);Muscle weakness (generalized) (M62.81);Difficulty in walking, not elsewhere classified (R26.2)     Time: 9417-4081 PT Time Calculation (min) (ACUTE ONLY): 53 min  Charges:  $Therapeutic Activity: 53-67 mins                     Renetta Suman B. Migdalia Dk PT, DPT Acute Rehabilitation Services Pager [phone] Office [phone]    [place] 09/15/2020, 2:48 PM
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
## 2020-09-15 NOTE — Progress Notes (Signed)
Occupational Therapy Treatment Patient Details Name: Tim Day MRN: [id number] DOB: 1951-06-11 Today's Date: 09/15/2020    History of present illness 69 year old with hx of recent COVID pneumonia diagnosis (9/7-9/13, discharge on 2L O2), BL COVID induced DVTs, DM2, GERD HTN presenting with worsening SOB. Per chart found to have left pneumothorax and worsening bilateral infiltrates. Intubated 9/19-9/20, Chest tube removed 9/28.    OT comments  Pt seen for second therapy session this date, showing increased reserve with decreased O2 demands. Pt on 12L HFNC with O2 sats >93% throughout entirety of session. Pt completed sit <> stand with mod A +2 in stedy frame, then completed sit <> stand in stedy frame with min A for 1 min. He required mod A to return back supine in bed. Educated pt on skin integrity and importance of repositioning self to let areas on sacrum heal. Will continue to follow. Continue to recommend CIR.    Follow Up Recommendations  CIR;Supervision/Assistance - 24 hour    Equipment Recommendations  3 in 1 bedside commode;Tub/shower seat;Wheelchair cushion (measurements OT);Wheelchair (measurements OT);Hospital bed    Recommendations for Other Services Rehab consult    Precautions / Restrictions Precautions Precautions: Fall Restrictions Weight Bearing Restrictions: No       Mobility Bed Mobility Overal bed mobility: Needs Assistance Bed Mobility: Sit to Supine       Sit to supine: Mod assist   General bed mobility comments: assist for BLEs back onto mattress  Transfers Overall transfer level: Needs assistance Equipment used: Ambulation equipment used Transfers: Sit to/from Stand Sit to Stand: Mod assist;+2 physical assistance;Min assist         General transfer comment: mod A +2 to stand from recliner; min A to stand in stedy frame    Balance Overall balance assessment: Needs assistance Sitting-balance support: Bilateral upper extremity  supported;Feet supported Sitting balance-Leahy Scale: Poor Sitting balance - Comments: requires UE support and mod progressing to min guard A for static sitting  Postural control: Right lateral lean Standing balance support: Bilateral upper extremity supported Standing balance-Leahy Scale: Poor Standing balance comment: requires UE support on Stedy and min modA for stabilization                            ADL either performed or assessed with clinical judgement   ADL Overall ADL's : Needs assistance/impaired                         Toilet Transfer: Moderate assistance;+2 for physical assistance;+2 for safety/equipment Toilet Transfer Details (indicate cue type and reason): with use of stedy frame to stand         Functional mobility during ADLs: Moderate assistance;+2 for physical assistance;+2 for safety/equipment (with stedy) General ADL Comments: pt with decreased O2 demands and showing increased energy reserve. Able to stand in steady frame ~1 minute between transfer continued activity tolerance progression     Vision       Perception     Praxis      Cognition Arousal/Alertness: Awake/alert Behavior During Therapy: WFL for tasks assessed/performed Overall Cognitive Status: Impaired/Different from baseline Area of Impairment: Attention;Awareness;Safety/judgement;Problem solving                   Current Attention Level: Selective     Safety/Judgement: Decreased awareness of safety Awareness: Emergent Problem Solving: Slow processing;Decreased initiation General Comments: Pt remembered OT from previous date, was more  interactive and making jokes with therapist        Exercises     Shoulder Instructions       General Comments      Pertinent Vitals/ Pain       Pain Assessment: Faces Faces Pain Scale: Hurts a little bit Pain Location: generalized Pain Descriptors / Indicators: Discomfort;Aching Pain Intervention(s): Monitored  during session;Repositioned  Home Living                                          Prior Functioning/Environment              Frequency  Min 2X/week        Progress Toward Goals  OT Goals(current goals can now be found in the care plan section)  Progress towards OT goals: Progressing toward goals  Acute Rehab OT Goals Patient Stated Goal: to get better OT Goal Formulation: With patient Time For Goal Achievement: 09/26/20 Potential to Achieve Goals: Good  Plan Discharge plan remains appropriate    Co-evaluation                 AM-PAC OT "6 Clicks" Daily Activity     Outcome Measure   Help from another person eating meals?: A Little Help from another person taking care of personal grooming?: A Little Help from another person toileting, which includes using toliet, bedpan, or urinal?: A Lot Help from another person bathing (including washing, rinsing, drying)?: A Lot Help from another person to put on and taking off regular upper body clothing?: A Lot Help from another person to put on and taking off regular lower body clothing?: A Lot 6 Click Score: 14    End of Session Equipment Utilized During Treatment: Oxygen  OT Visit Diagnosis: Unsteadiness on feet (R26.81);Other abnormalities of gait and mobility (R26.89);Muscle weakness (generalized) (M62.81);Other symptoms and signs involving cognitive function   Activity Tolerance Patient tolerated treatment well   Patient Left in chair;with call bell/phone within reach;with family/visitor present   Nurse Communication Mobility status        Time: 9509-3267 OT Time Calculation (min): 13 min  Charges: OT General Charges $OT Visit: 1 Visit OT Treatments $Self Care/Home Management : 8-22 mins  Zenovia Jarred, MSOT, OTR/L [facility] Office Number: [phone] Pager: [phone]  Zenovia Jarred 09/15/2020, 6:29 PM

## 2020-09-16 DIAGNOSIS — J9601 Acute respiratory failure with hypoxia: Secondary | ICD-10-CM | POA: Diagnosis not present

## 2020-09-16 DIAGNOSIS — E871 Hypo-osmolality and hyponatremia: Secondary | ICD-10-CM | POA: Diagnosis not present

## 2020-09-16 DIAGNOSIS — I82403 Acute embolism and thrombosis of unspecified deep veins of lower extremity, bilateral: Secondary | ICD-10-CM | POA: Diagnosis not present

## 2020-09-16 LAB — GLUCOSE, CAPILLARY
Glucose-Capillary: 138 mg/dL — ABNORMAL HIGH (ref 70–99)
Glucose-Capillary: 108 mg/dL — ABNORMAL HIGH (ref 70–99)
Glucose-Capillary: 153 mg/dL — ABNORMAL HIGH (ref 70–99)
Glucose-Capillary: 139 mg/dL — ABNORMAL HIGH (ref 70–99)

## 2020-09-16 LAB — COMPREHENSIVE METABOLIC PANEL
ALT: 72 U/L — ABNORMAL HIGH (ref 0–44)
AST: 37 U/L (ref 15–41)
Albumin: 2.2 g/dL — ABNORMAL LOW (ref 3.5–5.0)
Alkaline Phosphatase: 54 U/L (ref 38–126)
Anion gap: 9 (ref 5–15)
BUN: 14 mg/dL (ref 8–23)
CO2: 32 mmol/L (ref 22–32)
Calcium: 7.9 mg/dL — ABNORMAL LOW (ref 8.9–10.3)
Chloride: 92 mmol/L — ABNORMAL LOW (ref 98–111)
Creatinine, Ser: 0.57 mg/dL — ABNORMAL LOW (ref 0.61–1.24)
GFR, Estimated: >60 mL/min (ref >60)
Glucose, Bld: 88 mg/dL (ref 70–99)
Potassium: 4.6 mmol/L (ref 3.5–5.1)
Sodium: 133 mmol/L — ABNORMAL LOW (ref 135–145)
Total Bilirubin: 0.5 mg/dL (ref 0.3–1.2)
Total Protein: 5.6 g/dL — ABNORMAL LOW (ref 6.5–8.1)

## 2020-09-16 MED ORDER — ALPRAZOLAM 0.25 MG PO TABS: 0.2500 mg | ORAL_TABLET | Freq: Once | ORAL | Status: DC | PRN

## 2020-09-16 NOTE — Progress Notes (Signed)
Pt wife is about to break news that his father had passed away. Had requested for ativan before she beaks the news. Will monitor accordingly. Dr Jimmye Norman updated  beforehand.

## 2020-09-16 NOTE — Progress Notes (Signed)
PROGRESS NOTE  As per Dr. Jamse Arn: 69 year old with recent Covid pneumonia (September 7th through 76HY) complicated by bilateral DVTs, was admitted 08/19/2020 with left pneumothorax requiring chest tube placement and subsequent intubation 08/20/2020.  Tim Day  [id number]:[id number] DOB: 1951/01/31 DOA: 08/19/2020 PCP: Celene Squibb, MD   Assessment & Plan:   Active Problems:   DM2 (diabetes mellitus, type 2) ([place])   Benign essential HTN   Dyslipidemia   Chest tube in place   COVID-19 virus infection   Acute hypoxemic respiratory failure due to COVID-19 [facility])   Pneumothorax, left   Pneumonia due to COVID-19 virus   Leg DVT (deep venous thromboembolism), acute, bilateral (HCC)   Pressure injury of skin   Acute respiratory failure with hypoxia ([place])   HCAP (healthcare-associated pneumonia)   Primary spontaneous pneumothorax   Protein-calorie malnutrition, severe   Adjustment disorder with anxious mood   Acute hypoxic respiratory failure: secondary to recent COVID-19 pneumonia (Sept 2021) s/p left pneumothorax. Continue on supplemental oxygen and wean as tolerated, currently on HFNC, currently 8L. Improving. Continue w/ bronchodilators, chest PT & incentive spirometry. BiPAP qhs.   Depression: severity unknown. Ativan, hydroxyzine prn as per psych   Urinary retention: continue w/ foley as pt has failed voiding trials x 2. Continue on tamsulosin. Continue w/ foley until acute illness resolved.   Hyponatremia: trending up from day prior. Will continue to monitor   UTI: completed abx course   Bilateral DVTs: continue on  eliquis. Likely secondary to recent COVID19 infection   Protein calorie malnutrition: continue w/ nutritional supplements  Sudden unilateral vision loss: resolved spontaneously. MRI brain neg. Would have low threshold for ophthalmology evaluation if this recurs  GERD: continue on PPI  DVT prophylaxis: eliquis  Code Status: full  Family Communication: discussed pt's care w/ pt's family at bedside and answered their questions  Disposition Plan:  Likely d/c to CIR   Status is: Inpatient  Remains inpatient appropriate because:IV treatments appropriate due to intensity of illness or inability to take PO and Inpatient level of care appropriate due to severity of illness, still on HFNC at 8L so too much to go to CIR with, continue to wean supplemental oxygen as tolerated.    Dispo: The patient is from: Home              Anticipated d/c is to: CIR              Anticipated d/c date is: > 3 days              Patient currently is not medically stable to d/c.    Consultants:   Psychiatry    Procedures:    Antimicrobials:    Subjective: Pt c/o shortness of breath unchanged from day prior.   Objective: Vitals:   09/15/20 1700 09/15/20 2013 09/15/20 2126 09/16/20 0511  BP: 135/74  129/77   Pulse: (!) 103  (!) 106   Resp: 19  (!) 24   Temp: (!) 97.5 F (36.4 C)  98.5 F (36.9 C) 98.6 F (37 C)  TempSrc: Oral  Oral Oral  SpO2: 100% 99% 100%   Weight:  83.5 kg  Height:        Intake/Output Summary (Last 24 hours) at 09/16/2020 0836 Last data filed at 09/16/2020 0600 Gross per 24 hour  Intake 240 ml  Output 1500 ml  Net -1260 ml   Filed Weights   09/11/20 0632 09/12/20 0418 09/16/20 0511  Weight: 81.9 kg 80.7 kg 83.5 kg    Examination:  General exam: Appears calm and comfortable  Respiratory system: diminished breath sounds b/l. No rales.  Cardiovascular system: S1 & S2 +. No  rubs, gallops or clicks. Gastrointestinal system: Abdomen is nondistended, soft and  nontender. Hypoactive bowel sounds heard. Central nervous system: Alert and oriented. Moves all 4 extremities Psychiatry: Judgement and insight appear normal. Flat mood and affect      Data Reviewed: I have personally reviewed following labs and imaging studies  CBC: Recent Labs  Lab 09/11/20 0325 09/14/20 0240  WBC 6.9 6.3  NEUTROABS 4.0 3.7  HGB 10.7* 10.7*  HCT 34.2* 34.0*  MCV 91.0 93.9  PLT 261 211   Basic Metabolic Panel: Recent Labs  Lab 09/10/20 0158 09/11/20 0325 09/14/20 0240 09/15/20 0232 09/16/20 0213  NA 129* 130* 131* 132* 133*  K 4.7 4.8 4.7 4.5 4.6  CL 89* 89* 89* 91* 92*  CO2 34* 32 34* 32 32  GLUCOSE 196* 207* 188* 114* 88  BUN 19 17 18 15 14   CREATININE 0.57* 0.51* 0.54* 0.53* 0.57*  CALCIUM 8.1* 8.5* 8.1* 7.9* 7.9*  MG  --   --  1.8  --   --    GFR: Estimated Creatinine Clearance: 92.5 mL/min (A) (by C-G formula based on SCr of 0.57 mg/dL (L)). Liver Function Tests: Recent Labs  Lab 09/14/20 0240 09/15/20 0232 09/16/20 0213  AST 36 33 37  ALT 67* 67* 72*  ALKPHOS 65 53 54  BILITOT 0.6 0.9 0.5  PROT 5.3* 5.5* 5.6*  ALBUMIN 2.2* 2.2* 2.2*   No results for input(s): LIPASE, AMYLASE in the last 168 hours. No results for input(s): AMMONIA in the last 168 hours. Coagulation Profile: No results for input(s): INR, PROTIME in the last 168 hours. Cardiac Enzymes: No results for input(s): CKTOTAL, CKMB, CKMBINDEX, TROPONINI in the last 168 hours. BNP (last 3 results) No results for input(s): PROBNP in the last 8760 hours. HbA1C: No results for input(s): HGBA1C in the last 72 hours. CBG: Recent Labs  Lab 09/15/20 0738 09/15/20 1202 09/15/20 1652 09/15/20 2125 09/16/20 0807  GLUCAP 96 141* 114* 104* 138*   Lipid Profile: No results for input(s): CHOL, HDL, LDLCALC, TRIG, CHOLHDL, LDLDIRECT in the last 72 hours. Thyroid Function Tests: No results for input(s): TSH, T4TOTAL, FREET4, T3FREE, THYROIDAB in the last 72 hours. Anemia  Panel: No results for input(s): VITAMINB12, FOLATE, FERRITIN, TIBC, IRON, RETICCTPCT in the last 72 hours. Sepsis Labs: No results for input(s): PROCALCITON, LATICACIDVEN in the last 168 hours.  No results found for this or any previous visit (from the past 240 hour(s)).       Radiology Studies: No results found.      Scheduled Meds: . albuterol  2 puff Inhalation BID  . apixaban  5 mg Oral BID  . chlorhexidine  15 mL Mouth Rinse BID  . Chlorhexidine Gluconate Cloth  6 each Topical Daily  . collagenase   Topical BID  . docusate sodium  100 mg Oral BID  . feeding supplement  237 mL Oral TID BM  . hydrocortisone cream   Topical BID  . hydroxypropyl methylcellulose /  hypromellose  2 drop Both Eyes QID  . insulin aspart  0-15 Units Subcutaneous TID WC  . insulin aspart  0-5 Units Subcutaneous QHS  . insulin aspart  15 Units Subcutaneous TID WC  . insulin glargine  25 Units Subcutaneous BID  . linagliptin  5 mg Oral Daily  . mouth rinse  15 mL Mouth Rinse q12n4p  . melatonin  3 mg Oral QHS  . pantoprazole  40 mg Oral Daily  . tamsulosin  0.4 mg Oral Daily   Continuous Infusions: . sodium chloride       LOS: 28 days    Time spent: 34 mins     Wyvonnia Dusky, MD [facility] Pager [phone]  If 7PM-7AM, please contact night-coverage [email or web address] 09/16/2020, 8:36 AM

## 2020-09-17 ENCOUNTER — Inpatient Hospital Stay (HOSPITAL_COMMUNITY): Payer: 59

## 2020-09-17 DIAGNOSIS — E119 Type 2 diabetes mellitus without complications: Secondary | ICD-10-CM | POA: Diagnosis not present

## 2020-09-17 DIAGNOSIS — U071 COVID-19: Secondary | ICD-10-CM | POA: Diagnosis not present

## 2020-09-17 DIAGNOSIS — I10 Essential (primary) hypertension: Secondary | ICD-10-CM | POA: Diagnosis not present

## 2020-09-17 DIAGNOSIS — J9601 Acute respiratory failure with hypoxia: Secondary | ICD-10-CM | POA: Diagnosis not present

## 2020-09-17 LAB — CBC
HCT: 34.1 % — ABNORMAL LOW (ref 39.0–52.0)
Hemoglobin: 10.4 g/dL — ABNORMAL LOW (ref 13.0–17.0)
MCH: 28.6 pg (ref 26.0–34.0)
MCHC: 30.5 g/dL (ref 30.0–36.0)
MCV: 93.7 fL (ref 80.0–100.0)
Platelets: 242 10*3/uL (ref 150–400)
RBC: 3.64 MIL/uL — ABNORMAL LOW (ref 4.22–5.81)
RDW: 17.3 % — ABNORMAL HIGH (ref 11.5–15.5)
WBC: 7.3 10*3/uL (ref 4.0–10.5)
nRBC: 0.6 % — ABNORMAL HIGH (ref 0.0–0.2)

## 2020-09-17 LAB — D-DIMER, QUANTITATIVE: D-Dimer, Quant: 2.41 ug/mL-FEU — ABNORMAL HIGH (ref 0.00–0.50)

## 2020-09-17 LAB — BLOOD GAS, VENOUS
Acid-Base Excess: 10.5 mmol/L — ABNORMAL HIGH (ref 0.0–2.0)
Bicarbonate: 36.9 mmol/L — ABNORMAL HIGH (ref 20.0–28.0)
FIO2: 40
O2 Saturation: 45.5 %
Patient temperature: 37
pCO2, Ven: 77 mmHg (ref 44.0–60.0)
pH, Ven: 7.303 (ref 7.250–7.430)

## 2020-09-17 LAB — BLOOD GAS, ARTERIAL
Acid-Base Excess: 11.1 mmol/L — ABNORMAL HIGH (ref 0.0–2.0)
Bicarbonate: 36.6 mmol/L — ABNORMAL HIGH (ref 20.0–28.0)
FIO2: 40
O2 Saturation: 99.4 %
Patient temperature: 38.3
pCO2 arterial: 68.3 mmHg (ref 32.0–48.0)
pH, Arterial: 7.356 (ref 7.350–7.450)
pO2, Arterial: 138 mmHg — ABNORMAL HIGH (ref 83.0–108.0)

## 2020-09-17 LAB — IRON AND TIBC
Iron: 48 ug/dL (ref 45–182)
Saturation Ratios: 16 % — ABNORMAL LOW (ref 17.9–39.5)
TIBC: 291 ug/dL (ref 250–450)
UIBC: 243 ug/dL

## 2020-09-17 LAB — COMPREHENSIVE METABOLIC PANEL
ALT: 63 U/L — ABNORMAL HIGH (ref 0–44)
AST: 26 U/L (ref 15–41)
Albumin: 2.2 g/dL — ABNORMAL LOW (ref 3.5–5.0)
Alkaline Phosphatase: 54 U/L (ref 38–126)
Anion gap: 6 (ref 5–15)
BUN: 15 mg/dL (ref 8–23)
CO2: 34 mmol/L — ABNORMAL HIGH (ref 22–32)
Calcium: 8 mg/dL — ABNORMAL LOW (ref 8.9–10.3)
Chloride: 92 mmol/L — ABNORMAL LOW (ref 98–111)
Creatinine, Ser: 0.5 mg/dL — ABNORMAL LOW (ref 0.61–1.24)
GFR, Estimated: >60 mL/min (ref >60)
Glucose, Bld: 168 mg/dL — ABNORMAL HIGH (ref 70–99)
Potassium: 4.7 mmol/L (ref 3.5–5.1)
Sodium: 132 mmol/L — ABNORMAL LOW (ref 135–145)
Total Bilirubin: 0.4 mg/dL (ref 0.3–1.2)
Total Protein: 5.3 g/dL — ABNORMAL LOW (ref 6.5–8.1)

## 2020-09-17 LAB — GLUCOSE, CAPILLARY
Glucose-Capillary: 99 mg/dL (ref 70–99)
Glucose-Capillary: 151 mg/dL — ABNORMAL HIGH (ref 70–99)
Glucose-Capillary: 264 mg/dL — ABNORMAL HIGH (ref 70–99)
Glucose-Capillary: 134 mg/dL — ABNORMAL HIGH (ref 70–99)

## 2020-09-17 LAB — PROCALCITONIN: Procalcitonin: <0.1 ng/mL

## 2020-09-17 LAB — FOLATE: Folate: 7.3 ng/mL (ref >5.9)

## 2020-09-17 LAB — FERRITIN
Ferritin: 180 ng/mL (ref 24–336)
Ferritin: 173 ng/mL (ref 24–336)

## 2020-09-17 LAB — RETICULOCYTES
Immature Retic Fract: 29.6 % — ABNORMAL HIGH (ref 2.3–15.9)
RBC.: 3.32 MIL/uL — ABNORMAL LOW (ref 4.22–5.81)
Retic Count, Absolute: 303 10*3/uL — ABNORMAL HIGH (ref 19.0–186.0)
Retic Ct Pct: 9.3 % — ABNORMAL HIGH (ref 0.4–3.1)

## 2020-09-17 LAB — VITAMIN B12: Vitamin B-12: 331 pg/mL (ref 180–914)

## 2020-09-17 LAB — TSH: TSH: 2.161 u[IU]/mL (ref 0.350–4.500)

## 2020-09-17 LAB — T4, FREE: Free T4: 0.79 ng/dL (ref 0.61–1.12)

## 2020-09-17 MED ORDER — LORAZEPAM 2 MG/ML IJ SOLN
0.5000 mg | Freq: Four times a day (QID) | INTRAMUSCULAR | Status: DC | PRN
  Administered 2020-09-17: 0.5 mg via INTRAVENOUS
  Filled 2020-09-17: qty 1

## 2020-09-17 MED ORDER — ALBUTEROL SULFATE HFA 108 (90 BASE) MCG/ACT IN AERS: 2.0000 | INHALATION_SPRAY | Freq: Three times a day (TID) | RESPIRATORY_TRACT | Status: DC

## 2020-09-17 MED ORDER — ACETAMINOPHEN 325 MG PO TABS
650.0000 mg | ORAL_TABLET | Freq: Four times a day (QID) | ORAL | Status: DC | PRN
  Administered 2020-09-18 – 2020-09-28 (×19): 650 mg via ORAL
  Filled 2020-09-17 (×19): qty 2

## 2020-09-17 MED ORDER — PIPERACILLIN-TAZOBACTAM 3.375 G IVPB
3.3750 g | Freq: Three times a day (TID) | INTRAVENOUS | Status: DC
  Administered 2020-09-17: 3.375 g via INTRAVENOUS
  Filled 2020-09-17 (×3): qty 50

## 2020-09-17 MED ORDER — IOHEXOL 350 MG/ML SOLN
75.0000 mL | Freq: Once | INTRAVENOUS | Status: AC | PRN
  Administered 2020-09-17: 75 mL via INTRAVENOUS

## 2020-09-17 MED ORDER — ACETAMINOPHEN 650 MG RE SUPP
650.0000 mg | Freq: Four times a day (QID) | RECTAL | Status: DC | PRN
  Administered 2020-09-17: 650 mg via RECTAL
  Filled 2020-09-17: qty 1

## 2020-09-17 MED ORDER — ALBUTEROL SULFATE HFA 108 (90 BASE) MCG/ACT IN AERS
2.0000 | INHALATION_SPRAY | Freq: Two times a day (BID) | RESPIRATORY_TRACT | Status: DC
  Administered 2020-09-19 – 2020-09-25 (×12): 2 via RESPIRATORY_TRACT

## 2020-09-17 MED ORDER — ALBUTEROL SULFATE HFA 108 (90 BASE) MCG/ACT IN AERS
2.0000 | INHALATION_SPRAY | Freq: Three times a day (TID) | RESPIRATORY_TRACT | Status: AC
  Administered 2020-09-17 – 2020-09-19 (×6): 2 via RESPIRATORY_TRACT

## 2020-09-17 MED ORDER — ACETAMINOPHEN 650 MG RE SUPP: 650.0000 mg | Freq: Four times a day (QID) | RECTAL | Status: DC | PRN

## 2020-09-17 MED ORDER — METHYLPREDNISOLONE SODIUM SUCC 40 MG IJ SOLR
40.0000 mg | Freq: Two times a day (BID) | INTRAMUSCULAR | Status: DC
  Administered 2020-09-17 – 2020-09-20 (×7): 40 mg via INTRAVENOUS
  Filled 2020-09-17 (×7): qty 1

## 2020-09-17 MED ORDER — TOBRAMYCIN 0.3 % OP SOLN
1.0000 [drp] | Freq: Two times a day (BID) | OPHTHALMIC | Status: DC
  Administered 2020-09-17 – 2020-09-22 (×11): 1 [drp] via OPHTHALMIC
  Filled 2020-09-17: qty 5

## 2020-09-17 NOTE — Progress Notes (Signed)
ABG results abnormal. Posey Pronto, MD paged and made aware.

## 2020-09-17 NOTE — Progress Notes (Signed)
RT transported patient to CT and returned to 6E09. No complications. Vitals stable throughout. Patient tolerated well. RT will continue to monitor.

## 2020-09-17 NOTE — Significant Event (Signed)
Rapid Response Event Note   Reason for Call :  Second set of eyes on patient   Initial Focused Assessment:  RN called and explained that her patient has had increased anxiety, tachypnic, and is moaning/grunting. This is a change from how the patient has been.  Patient is on Bipap right now.  He is AOx4 currently.      Interventions:  Chest xray showed bilateral opacities and no effusion CT scan for possible PE was negative 12-lead EKG negative  Plan of Care:  Will continue to monitor patient until his status improves.  Patient agreed to remain on Bipap until 1700, and then he can take a break from bipap.   Event Summary:   MD Notified: Posey Pronto Call Time: 4439 Arrival Time: 2659 delayed arrival because I was with another patient End Time: [place], RN

## 2020-09-17 NOTE — Progress Notes (Addendum)
PROGRESS NOTE  As per Dr. Jamse Arn: 69 year old with recent Covid pneumonia (September 7th through 81EH) complicated by bilateral DVTs, was admitted 08/19/2020 with left pneumothorax requiring chest tube placement and subsequent intubation 08/20/2020.  He was extubated the following day but has persistent bilateral infiltrates is presently requiring ongoing high flow nasal cannula 10 to 15 L oxygen.  Hospital course is complicated by urinary retention presently on Foley catheter.  He also had an episode of sudden loss of vision in his right eye which has resolved.  Neurology work-up is negative.  Tim Day  [id number]:[id number] DOB: 1951-01-11 DOA: 08/19/2020 PCP: Celene Squibb, MD   Assessment & Plan:   Active Problems:   DM2 (diabetes mellitus, type 2) ([place])   Benign essential HTN   Dyslipidemia   Chest tube in place   COVID-19 virus infection   Acute hypoxemic respiratory failure due to COVID-19 [facility])   Pneumothorax, left   Pneumonia due to COVID-19 virus   Leg DVT (deep venous thromboembolism), acute, bilateral (HCC)   Pressure injury of skin   Acute respiratory failure with hypoxia ([place])   HCAP (healthcare-associated pneumonia)   Primary spontaneous pneumothorax   Protein-calorie malnutrition, severe   Adjustment disorder with anxious mood   Acute hypoxic respiratory failure with hypercarbia : secondary to recent COVID-19 pneumonia (Sept 2021) s/p left pneumothorax. Continue on supplemental oxygen and wean as tolerated, currently on HFNC, currently 8L. Improving. Continue w/ bronchodilators, chest PT & incentive spirometry. BiPAP qhs. Pt is still tachypnea and will add solumedrol and sch albuterol nebs for next two days every 8 hours. SpO2: 95 % O2 Flow Rate (L/min): 5 L/min FiO2 (%): 98 % Patient's last chest x-ray reviewed.  Due to persistent hypoxia we will also repeat CTA of the chest. Per VBG pt has hypercarbic respiratory failure and we will start  bipap.  Depression: severity unknown. Ativan, hydroxyzine prn as per psych.   Urinary retention: continue w/ foley as pt has failed voiding trials x 2. Continue on tamsulosin. Continue w/ foley until acute illness resolved.   Hyponatremia: Serum sodium is 132 and stable will continue to monitor   UTI: completed abx course   Bilateral DVTs: continue on eliquis. Likely secondary to recent COVID19 infection.  Protein calorie malnutrition: continue w/ nutritional supplements  Sudden unilateral vision loss: resolved spontaneously. MRI brain neg. Would have low threshold for ophthalmology evaluation if this recurs.  Patient has had a negative carotid Doppler, negative brain MRI, with an echocardiogram showing: 1. Left ventricular ejection fraction, by estimation, is 60 to 65%. The left ventricle has normal function. The left ventricle has no regional wall motion abnormalities. There is mild concentric left ventricular hypertrophy. Left ventricular diastolic parameters are consistent with Grade I diastolic dysfunction (impaired relaxation). 2. Right ventricular systolic function is normal. The right ventricular size is normal. 3. The mitral valve is normal in structure. No evidence of mitral valve regurgitation. No evidence of mitral stenosis. 4. The aortic valve is tricuspid. There is mild calcification of the aortic valve. There is mild thickening of the aortic valve. Aortic valve regurgitation is not visualized. No aortic stenosis is present. 5. Aortic dilatation noted. There is mild dilatation at the level of the sinuses of Valsalva, measuring 41 mm. There is mild dilatation of the ascending aorta. 6. The inferior vena cava is normal in size with greater than 50% respiratory variability, suggesting right atrial pressure of 3 mmHg.  Do not suspect an embolic source. Pt has been seen by  Dr.Falk from cardiology.   GERD: continue on PPI  Swelling of both eyelids: Will start pt on empiric  abx eye drops.     DVT prophylaxis: eliquis  Code Status: full  Family Communication: discussed pt's care w/ pt's family at bedside and answered their questions  Disposition Plan:  Likely d/c to CIR   Status is: Inpatient  Remains inpatient appropriate because:IV treatments appropriate due to intensity of illness or inability to take PO and Inpatient level of care appropriate due to severity of illness, still on HFNC at 8L so too much to go to CIR with, continue to wean supplemental oxygen as tolerated.    Dispo: The patient is from: Home              Anticipated d/c is to: CIR              Anticipated d/c date is: > 3 days              Patient currently is not medically stable to d/c.  Consultants:  Psychiatry : Waylan Boga. Cardiology: Dr.Falk  Procedures:  Echocardiogram:08/26/2020  Antimicrobials:  Anti-infectives (From admission, onward)   Start     Dose/Rate Route Frequency Ordered Stop   09/03/20 2100  cefTRIAXone (ROCEPHIN) 2 g in sodium chloride 0.9 % 100 mL IVPB  Status:  Discontinued        2 g 200 mL/hr over 30 Minutes Intravenous Every 24 hours 09/03/20 1802 09/04/20 1443   08/31/20 2230  sulfamethoxazole-trimethoprim (BACTRIM DS) 800-160 MG per tablet 1 tablet  Status:  Discontinued        1 tablet Oral Every 12 hours 08/31/20 2201 09/06/20 1548   08/19/20 2000  piperacillin-tazobactam (ZOSYN) IVPB 3.375 g        3.375 g 12.5 mL/hr over 240 Minutes Intravenous Every 8 hours 08/19/20 1850 08/23/20 2359   08/19/20 1845  piperacillin-tazobactam (ZOSYN) IVPB 3.375 g  Status:  Discontinued        3.375 g 100 mL/hr over 30 Minutes Intravenous  Once 08/19/20 1838 08/19/20 1850   08/19/20 0900  vancomycin (VANCOREADY) IVPB 1500 mg/300 mL        1,500 mg 150 mL/hr over 120 Minutes Intravenous  Once 08/19/20 0831 08/19/20 1303   08/19/20 0845  piperacillin-tazobactam (ZOSYN) IVPB 3.375 g        3.375 g 100 mL/hr over 30 Minutes Intravenous  Once 08/19/20 0831 08/19/20  1103       Subjective: Pt c/o shortness of breath unchanged from day prior.   09/17/2020: Pt seen today he is alert,awake, oriented and answers questions. He appears anxious or stiff.  Wife is at bedside and states that his eyes have been Swollen for the past few days and was requesting antibiotics.  Patient is alert and awake and denies any complaints of chest pain or shortness of breath any stomach symptoms diarrhea nausea vomiting or headache.  He answers questions appropriately, mood affect and behavior is within normal limits.  Therapy is working with patient and he was out of bed yesterday but does need assistance to ambulate and will need aggressive rehab and physical therapy.  Suspect is persistent respiratory failure with hypoxia complication of his RCVEL-38 pneumonia.  Chart reviewed.  Labs show that patient has developed anemia since September of this year.  Aggressive patchy airspace disease within left upper lobe.  CT of the chest was done on 7 September showing bilateral groundglass airspace disease consistent with his COVID-19 pneumonia minimal pneumomediastinum consistent  with barotrauma.  Objective: Vitals:   09/16/20 2003 09/16/20 2007 09/17/20 0459 09/17/20 0744  BP:  133/70 126/79 117/70  Pulse:  97 93 91  Resp:  (!) 32 (!) 36 19  Temp:  98.8 F (37.1 C) 97.8 F (36.6 C) 98.2 F (36.8 C)  TempSrc:  Oral Oral Oral  SpO2: 96% 96% 97% 99%  Weight:   (!) 189 kg   Height:        Intake/Output Summary (Last 24 hours) at 09/17/2020 0936 Last data filed at 09/17/2020 0526 Gross per 24 hour  Intake 240 ml  Output 3250 ml  Net -3010 ml   Filed Weights   09/12/20 0418 09/16/20 0511 09/17/20 0459  Weight: 80.7 kg 83.5 kg (!) 189 kg    Examination: General exam: Appears calm and comfortable  Respiratory system: Bilateral inspiratory and expiratory rhonchi and scattered wheezing's.   Cardiovascular system: S1 & S2 +. No  rubs, gallops or clicks. Gastrointestinal  system: Abdomen is nondistended, soft and nontender. Hypoactive bowel sounds heard. Central nervous system: Alert and oriented. Moves all 4 extremities Psychiatry: Judgement and insight appear normal. Flat mood and affect   Data Reviewed: I have personally reviewed following labs and imaging studies  CBC: Recent Labs  Lab 09/11/20 0325 09/14/20 0240 09/17/20 0203  WBC 6.9 6.3 7.3  NEUTROABS 4.0 3.7  --   HGB 10.7* 10.7* 10.4*  HCT 34.2* 34.0* 34.1*  MCV 91.0 93.9 93.7  PLT 261 250 678   Basic Metabolic Panel: Recent Labs  Lab 09/11/20 0325 09/14/20 0240 09/15/20 0232 09/16/20 0213 09/17/20 0203  NA 130* 131* 132* 133* 132*  K 4.8 4.7 4.5 4.6 4.7  CL 89* 89* 91* 92* 92*  CO2 32 34* 32 32 34*  GLUCOSE 207* 188* 114* 88 168*  BUN 17 18 15 14 15   CREATININE 0.51* 0.54* 0.53* 0.57* 0.50*  CALCIUM 8.5* 8.1* 7.9* 7.9* 8.0*  MG  --  1.8  --   --   --    GFR: Estimated Creatinine Clearance: 151.3 mL/min (A) (by C-G formula based on SCr of 0.5 mg/dL (L)). Liver Function Tests: Recent Labs  Lab 09/14/20 0240 09/15/20 0232 09/16/20 0213 09/17/20 0203  AST 36 33 37 26  ALT 67* 67* 72* 63*  ALKPHOS 65 53 54 54  BILITOT 0.6 0.9 0.5 0.4  PROT 5.3* 5.5* 5.6* 5.3*  ALBUMIN 2.2* 2.2* 2.2* 2.2*   CBG: Recent Labs  Lab 09/16/20 0807 09/16/20 1157 09/16/20 1712 09/16/20 2140 09/17/20 0739  GLUCAP 138* 108* 153* 139* 99   Radiology Studies: No results found.  Scheduled Meds: . albuterol  2 puff Inhalation BID  . apixaban  5 mg Oral BID  . chlorhexidine  15 mL Mouth Rinse BID  . Chlorhexidine Gluconate Cloth  6 each Topical Daily  . collagenase   Topical BID  . docusate sodium  100 mg Oral BID  . feeding supplement  237 mL Oral TID BM  . hydrocortisone cream   Topical BID  . hydroxypropyl methylcellulose / hypromellose  2 drop Both Eyes QID  . insulin aspart  0-15 Units Subcutaneous TID WC  . insulin aspart  0-5 Units Subcutaneous QHS  . insulin aspart  15 Units  Subcutaneous TID WC  . insulin glargine  25 Units Subcutaneous BID  . linagliptin  5 mg Oral Daily  . mouth rinse  15 mL Mouth Rinse q12n4p  . melatonin  3 mg Oral QHS  . pantoprazole  40 mg  Oral Daily  . tamsulosin  0.4 mg Oral Daily   Continuous Infusions: . sodium chloride       LOS: 29 days   Time spent: 34 mins   Para Skeans, MD [facility] Pager [phone] If 7PM-7AM, please contact night-coverage [email or web address] 09/17/2020, 9:36 AM

## 2020-09-17 NOTE — Progress Notes (Signed)
Pharmacy Antibiotic Note  Tim Day is a 69 y.o. male admitted on 08/19/2020  With COVID  pneumonia. Since then has continue to have SOB and was on HFNC , today > BiPAP, chest CT shows multifocal opacities wbc wnl but spike fever 100.9.   Pharmacy has been consulted for Zosyn dosing.  Plan: Zosyn EI  Monitor s/s improvement and LOT abx  Height: 5\' 10"  (177.8 cm) Weight: (!) 189 kg (416 lb 10.7 oz) IBW/kg (Calculated) : 73  Temp (24hrs), Avg:98.7 F (37.1 C), Min:97.8 F (36.6 C), Max:100.9 F (38.3 C)  Recent Labs  Lab 09/11/20 0325 09/14/20 0240 09/15/20 0232 09/16/20 0213 09/17/20 0203  WBC 6.9 6.3  --   --  7.3  CREATININE 0.51* 0.54* 0.53* 0.57* 0.50*    Estimated Creatinine Clearance: 151.3 mL/min (A) (by C-G formula based on SCr of 0.5 mg/dL (L)).    Allergies  Allergen Reactions  . Eggs Or Egg-Derived Products Hives    Antimicrobials this admission: Baricitinib >> 9/24 Remdesivir 9/1 >>9.5 (last admit) Zosyn 9/18>>9/22 Bactrim 9/30 >>10/6  Dose adjustments this admission:   Microbiology results: 9/30 urine- enterobacter- sens to roceph, bactrim    Bonnita Nasuti Pharm.D. CPP, BCPS Clinical Pharmacist [phone] 09/17/2020 4:41 PM

## 2020-09-18 DIAGNOSIS — E119 Type 2 diabetes mellitus without complications: Secondary | ICD-10-CM | POA: Diagnosis not present

## 2020-09-18 DIAGNOSIS — U071 COVID-19: Secondary | ICD-10-CM | POA: Diagnosis not present

## 2020-09-18 DIAGNOSIS — I10 Essential (primary) hypertension: Secondary | ICD-10-CM | POA: Diagnosis not present

## 2020-09-18 DIAGNOSIS — J9601 Acute respiratory failure with hypoxia: Secondary | ICD-10-CM | POA: Diagnosis not present

## 2020-09-18 LAB — GLUCOSE, CAPILLARY
Glucose-Capillary: 258 mg/dL — ABNORMAL HIGH (ref 70–99)
Glucose-Capillary: 207 mg/dL — ABNORMAL HIGH (ref 70–99)
Glucose-Capillary: 304 mg/dL — ABNORMAL HIGH (ref 70–99)
Glucose-Capillary: 173 mg/dL — ABNORMAL HIGH (ref 70–99)

## 2020-09-18 LAB — PROCALCITONIN: Procalcitonin: <0.1 ng/mL

## 2020-09-18 LAB — OCCULT BLOOD X 1 CARD TO LAB, STOOL: Fecal Occult Bld: NEGATIVE

## 2020-09-18 LAB — FERRITIN: Ferritin: 166 ng/mL (ref 24–336)

## 2020-09-18 LAB — CK: Total CK: 30 U/L — ABNORMAL LOW (ref 49–397)

## 2020-09-18 LAB — LACTATE DEHYDROGENASE: LDH: 249 U/L — ABNORMAL HIGH (ref 98–192)

## 2020-09-18 LAB — C-REACTIVE PROTEIN: CRP: 0.6 mg/dL (ref <1.0)

## 2020-09-18 MED ORDER — INSULIN GLARGINE 100 UNIT/ML ~~LOC~~ SOLN
30.0000 [IU] | Freq: Two times a day (BID) | SUBCUTANEOUS | Status: DC
  Administered 2020-09-18 – 2020-09-20 (×4): 30 [IU] via SUBCUTANEOUS
  Filled 2020-09-18 (×7): qty 0.3

## 2020-09-18 NOTE — Progress Notes (Signed)
PROGRESS NOTE  As per Dr. Jamse Arn: 69 year old with recent Covid pneumonia (September 7th through 03TD) complicated by bilateral DVTs, was admitted 08/19/2020 with left pneumothorax requiring chest tube placement and subsequent intubation 08/20/2020.  He was extubated the following day but has persistent bilateral infiltrates is presently requiring ongoing high flow nasal cannula 10 to 15 L oxygen.  Hospital course is complicated by urinary retention presently on Foley catheter.  He also had an episode of sudden loss of vision in his right eye which has resolved.  Neurology work-up is negative.  Tim Day  HRC:[id number] DOB: 04/16/1951 DOA: 08/19/2020 PCP: Celene Squibb, MD   Assessment & Plan:   Active Problems:   DM2 (diabetes mellitus, type 2) ([place])   Benign essential HTN   Dyslipidemia   Chest tube in place   COVID-19 virus infection   Acute hypoxemic respiratory failure due to COVID-19 [facility])   Pneumothorax, left   Pneumonia due to COVID-19 virus   Leg DVT (deep venous thromboembolism), acute, bilateral ([facility])   Pressure injury of skin   Acute respiratory failure with hypoxia ([place])   HCAP (healthcare-associated pneumonia)   Primary spontaneous pneumothorax   Protein-calorie malnutrition, severe   Adjustment disorder with anxious mood   Acute hypoxic respiratory failure with hypercarbia : secondary to recent COVID-19 pneumonia (Sept 2021) s/p left pneumothorax. Continue on supplemental oxygen and wean as tolerated, currently on HFNC, currently 8L. Improving.  09/17/2020: Continue w/ bronchodilators, chest PT & incentive spirometry. BiPAP qhs. Pt is still tachypnea and will add solumedrol and sch albuterol nebs for next two days every 8 hours. SpO2: 94 % O2 Flow Rate (L/min): 4 L/min FiO2 (%): 40 % Patient's last chest x-ray reviewed.  Due to persistent hypoxia we will also repeat CTA of the chest. Per VBG pt has hypercarbic respiratory failure and we will start  bipap. 09/18/2020: PT seen today for f/u he is clinically doing better since  Yesterday and was started on bipap for hi hypercarbia.We will continue iv steroids for 5 days and then taper thereafter with long taper. SpO2: 94 % O2 Flow Rate (L/min): 4 L/min FiO2 (%): 40 % We will repeat ABG and follow.   Depression: severity unknown. Ativan, hydroxyzine prn as per psych.   Urinary retention: continue w/ foley as pt has failed voiding trials x 2. Continue on tamsulosin. Continue w/ foley until acute illness resolved.   Hyponatremia: Serum sodium yesterday 132 and stable will continue to monitor.   UTI: completed abx course.   Bilateral DVTs: continue on eliquis. Likely secondary to recent COVID19 infection.  Protein calorie malnutrition: continue w/ nutritional supplements  Sudden unilateral vision loss: resolved spontaneously. MRI brain neg. Would have low threshold for ophthalmology evaluation if this recurs.  Patient has had a negative carotid Doppler, negative brain MRI, with an echocardiogram showing: 1. Left ventricular ejection fraction, by estimation, is 60 to 65%. The left ventricle has normal function. The left ventricle has no regional wall motion abnormalities. There is mild concentric left ventricular hypertrophy. Left ventricular diastolic parameters are consistent with Grade I diastolic dysfunction (impaired relaxation). 2. Right ventricular systolic function is normal. The right ventricular size is normal. 3. The mitral valve is normal in structure. No evidence of mitral valve regurgitation. No evidence of mitral stenosis. 4. The aortic valve is tricuspid. There is mild calcification of the aortic valve. There is mild thickening of the aortic valve. Aortic valve regurgitation is not visualized. No aortic stenosis is present. 5. Aortic dilatation  noted. There is mild dilatation at the level of the sinuses of Valsalva, measuring 41 mm. There is mild dilatation of the  ascending aorta. 6. The inferior vena cava is normal in size with greater than 50% respiratory variability, suggesting right atrial pressure of 3 mmHg.  Do not suspect an embolic source. Pt has been seen by Dr.Falk from cardiology.   GERD: continue on PPI  Swelling of both eyelids: Will start pt on empiric abx eye drops.     DVT prophylaxis: eliquis  Code Status: full  Family Communication: discussed pt's care w/ pt's family at bedside and answered their questions  Disposition Plan:  Likely d/c to CIR   Status is: Inpatient  Remains inpatient appropriate because:IV treatments appropriate due to intensity of illness or inability to take PO and Inpatient level of care appropriate due to severity of illness, still on HFNC at 8L so too much to go to CIR with, continue to wean supplemental oxygen as tolerated.    Dispo: The patient is from: Home              Anticipated d/c is to: CIR              Anticipated d/c date is: > 3 days              Patient currently is not medically stable to d/c.  Consultants:  Psychiatry : Waylan Boga. Cardiology: Dr.Falk  Procedures:  Echocardiogram:08/26/2020  Antimicrobials:  Anti-infectives (From admission, onward)   Start     Dose/Rate Route Frequency Ordered Stop   09/17/20 1730  piperacillin-tazobactam (ZOSYN) IVPB 3.375 g  Status:  Discontinued        3.375 g 12.5 mL/hr over 240 Minutes Intravenous Every 8 hours 09/17/20 1635 09/17/20 1928   09/03/20 2100  cefTRIAXone (ROCEPHIN) 2 g in sodium chloride 0.9 % 100 mL IVPB  Status:  Discontinued        2 g 200 mL/hr over 30 Minutes Intravenous Every 24 hours 09/03/20 1802 09/04/20 1443   08/31/20 2230  sulfamethoxazole-trimethoprim (BACTRIM DS) 800-160 MG per tablet 1 tablet  Status:  Discontinued        1 tablet Oral Every 12 hours 08/31/20 2201 09/06/20 1548   08/19/20 2000  piperacillin-tazobactam (ZOSYN) IVPB 3.375 g        3.375 g 12.5 mL/hr over 240 Minutes Intravenous Every 8 hours  08/19/20 1850 08/23/20 2359   08/19/20 1845  piperacillin-tazobactam (ZOSYN) IVPB 3.375 g  Status:  Discontinued        3.375 g 100 mL/hr over 30 Minutes Intravenous  Once 08/19/20 1838 08/19/20 1850   08/19/20 0900  vancomycin (VANCOREADY) IVPB 1500 mg/300 mL        1,500 mg 150 mL/hr over 120 Minutes Intravenous  Once 08/19/20 0831 08/19/20 1303   08/19/20 0845  piperacillin-tazobactam (ZOSYN) IVPB 3.375 g        3.375 g 100 mL/hr over 30 Minutes Intravenous  Once 08/19/20 0831 08/19/20 1103       Subjective: Pt c/o shortness of breath unchanged from day prior.   09/17/2020: Pt seen today he is alert,awake, oriented and answers questions. He appears anxious or stiff.  Wife is at bedside and states that his eyes have been Swollen for the past few days and was requesting antibiotics.  Patient is alert and awake and denies any complaints of chest pain or shortness of breath any stomach symptoms diarrhea nausea vomiting or headache.  He answers questions appropriately, mood affect  and behavior is within normal limits.  Therapy is working with patient and he was out of bed yesterday but does need assistance to ambulate and will need aggressive rehab and physical therapy.  Suspect is persistent respiratory failure with hypoxia complication of his [id number] pneumonia.  Chart reviewed.  Labs show that patient has developed anemia since September of this year.  Aggressive patchy airspace disease within left upper lobe.  CT of the chest was done on 7 September showing bilateral groundglass airspace disease consistent with his COVID-19 pneumonia minimal pneumomediastinum consistent with barotrauma.  09/18/2020: Pt seen today and is alert and awake and oriented and d/w wife about plan and also he has worked with pt today and RR today is stable.  Objective: Vitals:   09/18/20 0617 09/18/20 0804 09/18/20 0843 09/18/20 1127  BP:   114/63 113/69  Pulse: 99  97 (!) 101  Resp: (!) 37  (!) 33 (!) 40    Temp:   97.9 F (36.6 C) (!) 97.3 F (36.3 C)  TempSrc:   Axillary Oral  SpO2: 95% 93% 99% 94%  Weight:      Height:        Intake/Output Summary (Last 24 hours) at 09/18/2020 1431 Last data filed at 09/18/2020 0600 Gross per 24 hour  Intake 243.08 ml  Output 1500 ml  Net -1256.92 ml   Filed Weights   09/16/20 0511 09/17/20 0459 09/18/20 0345  Weight: 83.5 kg (!) 189 kg 83.9 kg    Examination: General exam: Appears calm and comfortable  Respiratory system: Bilateral inspiratory and expiratory rhonchi and scattered wheezing's.   Cardiovascular system: S1 & S2 +. No  rubs, gallops or clicks. Gastrointestinal system: Abdomen is nondistended, soft and nontender. Hypoactive bowel sounds heard. Central nervous system: Alert and oriented. Moves all 4 extremities Psychiatry: Judgement and insight appear normal. Flat mood and affect   Data Reviewed: I have personally reviewed following labs and imaging studies  CBC: Recent Labs  Lab 09/14/20 0240 09/17/20 0203  WBC 6.3 7.3  NEUTROABS 3.7  --   HGB 10.7* 10.4*  HCT 34.0* 34.1*  MCV 93.9 93.7  PLT 250 671   Basic Metabolic Panel: Recent Labs  Lab 09/14/20 0240 09/15/20 0232 09/16/20 0213 09/17/20 0203  NA 131* 132* 133* 132*  K 4.7 4.5 4.6 4.7  CL 89* 91* 92* 92*  CO2 34* 32 32 34*  GLUCOSE 188* 114* 88 168*  BUN 18 15 14 15   CREATININE 0.54* 0.53* 0.57* 0.50*  CALCIUM 8.1* 7.9* 7.9* 8.0*  MG 1.8  --   --   --    GFR: Estimated Creatinine Clearance: 92.5 mL/min (A) (by C-G formula based on SCr of 0.5 mg/dL (L)). Liver Function Tests: Recent Labs  Lab 09/14/20 0240 09/15/20 0232 09/16/20 0213 09/17/20 0203  AST 36 33 37 26  ALT 67* 67* 72* 63*  ALKPHOS 65 53 54 54  BILITOT 0.6 0.9 0.5 0.4  PROT 5.3* 5.5* 5.6* 5.3*  ALBUMIN 2.2* 2.2* 2.2* 2.2*   CBG: Recent Labs  Lab 09/17/20 1138 09/17/20 1628 09/17/20 2122 09/18/20 0841 09/18/20 1130  GLUCAP 151* 264* 134* 258* 207*   Radiology Studies: CT  ANGIO CHEST PE W OR WO CONTRAST  Result Date: 09/17/2020 CLINICAL DATA:  Respiratory failure EXAM: CT ANGIOGRAPHY CHEST WITH CONTRAST TECHNIQUE: Multidetector CT imaging of the chest was performed using the standard protocol during bolus administration of intravenous contrast. Multiplanar CT image reconstructions and MIPs were obtained to evaluate the vascular  anatomy. CONTRAST:  42mL OMNIPAQUE IOHEXOL 350 MG/ML SOLN COMPARISON:  None. FINDINGS: Cardiovascular: Satisfactory opacification of the pulmonary arteries to the segmental level. No evidence of pulmonary embolism. Cardiomegaly. Three-vessel coronary artery calcifications. Enlargement of the main pulmonary artery measuring up to 3.5 cm in caliber. No pericardial effusion. Mediastinum/Nodes: No enlarged mediastinal, hilar, or axillary lymph nodes. Thyroid gland, trachea, and esophagus demonstrate no significant findings. Lungs/Pleura: Extensive bilateral heterogeneous and interstitial airspace opacity, predominantly peripheral and basilar. Small bilateral pleural effusions. Upper Abdomen: No acute abnormality. Musculoskeletal: No chest wall abnormality. No acute or significant osseous findings. Review of the MIP images confirms the above findings. IMPRESSION: 1. Negative examination for pulmonary embolism. 2. Extensive bilateral heterogeneous and interstitial airspace opacity, predominantly peripheral and basilar. Small bilateral pleural effusions. Findings are consistent with multifocal infection, edema, and/or ARDS, including COVID-19 pneumonia if clinically suspected. 3. Cardiomegaly and coronary artery disease. 4. Enlargement of the main pulmonary artery measuring up to 3.5 cm in caliber, which can be seen in pulmonary hypertension. Electronically Signed   By: Eddie Candle M.D.   On: 09/17/2020 15:42   DG Chest Port 1 View  Result Date: 09/17/2020 CLINICAL DATA:  ARDS, COVID-19 positivity EXAM: PORTABLE CHEST 1 VIEW COMPARISON:  09/12/2020 FINDINGS:  Cardiac shadow is enlarged but stable. This is accentuated by the portable technique. Poor inspiratory effort is noted. Persistent bilateral airspace opacities are noted similar to that seen on recent CT examination consistent with the given clinical history. No sizable effusion is noted. No bony abnormality is seen. IMPRESSION: Stable bilateral airspace opacities consistent with the given clinical history of COVID-19 positivity. Electronically Signed   By: Inez Catalina M.D.   On: 09/17/2020 15:53    Scheduled Meds: . albuterol  2 puff Inhalation TID  . [START ON 09/19/2020] albuterol  2 puff Inhalation BID  . apixaban  5 mg Oral BID  . chlorhexidine  15 mL Mouth Rinse BID  . Chlorhexidine Gluconate Cloth  6 each Topical Daily  . collagenase   Topical BID  . docusate sodium  100 mg Oral BID  . feeding supplement  237 mL Oral TID BM  . hydrocortisone cream   Topical BID  . hydroxypropyl methylcellulose / hypromellose  2 drop Both Eyes QID  . insulin aspart  0-15 Units Subcutaneous TID WC  . insulin aspart  0-5 Units Subcutaneous QHS  . insulin aspart  15 Units Subcutaneous TID WC  . insulin glargine  30 Units Subcutaneous BID  . linagliptin  5 mg Oral Daily  . mouth rinse  15 mL Mouth Rinse q12n4p  . melatonin  3 mg Oral QHS  . methylPREDNISolone (SOLU-MEDROL) injection  40 mg Intravenous Q12H  . pantoprazole  40 mg Oral Daily  . tamsulosin  0.4 mg Oral Daily  . tobramycin  1 drop Both Eyes Q12H   Continuous Infusions: . sodium chloride       LOS: 30 days   Time spent: 15 mins   Para Skeans, MD [facility] Pager [phone] If 7PM-7AM, please contact night-coverage [email or web address] 09/18/2020, 2:31 PM

## 2020-09-18 NOTE — Progress Notes (Signed)
Inpatient Diabetes Program Recommendations  AACE/ADA: New Consensus Statement on Inpatient Glycemic Control (2015)  Target Ranges:  Prepandial:   less than 140 mg/dL      Peak postprandial:   less than 180 mg/dL (1-2 hours)      Critically ill patients:  140 - 180 mg/dL   Lab Results  Component Value Date   GLUCAP 258 (H) 09/18/2020   HGBA1C 7.2 (H) 08/08/2020    Review of Glycemic Control  Diabetes history: DM 2 Outpatient Diabetes medications: Glipizide 5 mg bid, Toujeo 25 units qam, Metformin 1000 mg bid,  Current orders for Inpatient glycemic control:  Lantus 25 units bid Novolog 0-15 units tid + hs Novolog 15 units tid meal coverage Tradjenta 5 mg Daily  Ensure Enlive tid between meals Solumedrol 40 mg Q12 hours  Inpatient Diabetes Program Recommendations:    Solumedrol started yesterday. Glucose trends 280's this am.   - Consider increasing Lantus to 32 units bid  Thanks,  Tama Headings RN, MSN, BC-ADM Inpatient Diabetes Coordinator Team Pager [phone] (8a-5p)

## 2020-09-18 NOTE — Progress Notes (Signed)
Patient remained on BIPAP until 4L30 AM. RT placed him on HF at 4 L.; O2 sats 95 to 100 %.  Will continue to monitor patient.

## 2020-09-18 NOTE — Progress Notes (Signed)
Pt placed on bipap for the night. °

## 2020-09-18 NOTE — Progress Notes (Addendum)
Physical Therapy Treatment Patient Details Name: Tim Day MRN: [id number] DOB: 1951/01/19 Today's Date: 09/18/2020    History of Present Illness 69 year old with hx of recent COVID pneumonia diagnosis (9/7-9/13, discharge on 2L O2), BL COVID induced DVTs, DM2, GERD HTN presenting with worsening SOB. Per chart found to have left pneumothorax and worsening bilateral infiltrates. Intubated 9/19-9/20, Chest tube removed 9/28.     PT Comments    Continues to make slow, steady progress.    Follow Up Recommendations  CIR     Equipment Recommendations  Rolling walker with 5" wheels;Wheelchair (measurements PT);3in1 (PT);Wheelchair cushion (measurements PT);Other (comment) (further assessment next venue)    Recommendations for Other Services       Precautions / Restrictions Precautions Precautions: Fall Precaution Comments: High RR    Mobility  Bed Mobility Overal bed mobility: Needs Assistance Bed Mobility: Supine to Sit     Supine to sit: Max assist;+2 for safety/equipment     General bed mobility comments: Assist to bring legs off of bed, elevate trunk into sitting and bring hips to EOB.   Transfers Overall transfer level: Needs assistance Equipment used: Ambulation equipment used Transfers: Sit to/from Stand Sit to Stand: Mod assist;+2 physical assistance         General transfer comment: Assist to bring hips up and for balance. Used bed pad under hips to assist. Used Stedy for bed to chair.   Ambulation/Gait                 Stairs             Wheelchair Mobility    Modified Rankin (Stroke Patients Only)       Balance Overall balance assessment: Needs assistance Sitting-balance support: Bilateral upper extremity supported;Feet supported Sitting balance-Leahy Scale: Poor Sitting balance - Comments: UE support   Standing balance support: Bilateral upper extremity supported Standing balance-Leahy Scale: Poor Standing balance  comment: Stood x 3 with Stedy for 15-45 sec with +2 min assist                             Cognition Arousal/Alertness: Awake/alert Behavior During Therapy: Anxious;Flat affect Overall Cognitive Status: Impaired/Different from baseline Area of Impairment: Attention;Awareness;Safety/judgement;Problem solving                   Current Attention Level: Selective     Safety/Judgement: Decreased awareness of safety Awareness: Emergent Problem Solving: Slow processing;Decreased initiation        Exercises General Exercises - Upper Extremity Shoulder Flexion: AAROM;Both;10 reps;Seated General Exercises - Lower Extremity Long Arc Quad: AROM;Strengthening;Both;10 reps;Seated    General Comments General comments (skin integrity, edema, etc.): Pt with RR rate in 40's with activity. 30's at rest. SpO2 to 85% on 4L O2 with activity. Recovered to 90% after 1 minute. Wife present for treatment      Pertinent Vitals/Pain Pain Assessment: Faces Faces Pain Scale: Hurts a little bit Pain Location: generalized Pain Descriptors / Indicators: Discomfort;Aching Pain Intervention(s): Monitored during session;Repositioned    Home Living                      Prior Function            PT Goals (current goals can now be found in the care plan section) Acute Rehab PT Goals Patient Stated Goal: to get better Progress towards PT goals: Progressing toward goals    Frequency  Min 3X/week      PT Plan Current plan remains appropriate    Co-evaluation              AM-PAC PT "6 Clicks" Mobility   Outcome Measure  Help needed turning from your back to your side while in a flat bed without using bedrails?: Total Help needed moving from lying on your back to sitting on the side of a flat bed without using bedrails?: Total Help needed moving to and from a bed to a chair (including a wheelchair)?: Total Help needed standing up from a chair using your arms  (e.g., wheelchair or bedside chair)?: A Lot Help needed to walk in hospital room?: Total Help needed climbing 3-5 steps with a railing? : Total 6 Click Score: 7    End of Session Equipment Utilized During Treatment: Oxygen;Gait belt Activity Tolerance: Patient limited by fatigue Patient left: with call bell/phone within reach;with family/visitor present;in chair Nurse Communication: Mobility status;Need for lift equipment PT Visit Diagnosis: Other abnormalities of gait and mobility (R26.89);Muscle weakness (generalized) (M62.81);Difficulty in walking, not elsewhere classified (R26.2)     Time: 5621-3086 PT Time Calculation (min) (ACUTE ONLY): 31 min  Charges:  $Therapeutic Exercise: 8-22 mins $Therapeutic Activity: 8-22 mins                     [place] Pager [phone] Office [place] 09/18/2020, 3:11 PM

## 2020-09-19 DIAGNOSIS — U071 COVID-19: Secondary | ICD-10-CM | POA: Diagnosis not present

## 2020-09-19 DIAGNOSIS — I10 Essential (primary) hypertension: Secondary | ICD-10-CM | POA: Diagnosis not present

## 2020-09-19 DIAGNOSIS — J9601 Acute respiratory failure with hypoxia: Secondary | ICD-10-CM | POA: Diagnosis not present

## 2020-09-19 DIAGNOSIS — J189 Pneumonia, unspecified organism: Secondary | ICD-10-CM | POA: Diagnosis not present

## 2020-09-19 LAB — PHOSPHORUS
Phosphorus: 4.7 mg/dL — ABNORMAL HIGH (ref 2.5–4.6)
Phosphorus: 4.3 mg/dL (ref 2.5–4.6)

## 2020-09-19 LAB — MAGNESIUM
Magnesium: 2.2 mg/dL (ref 1.7–2.4)
Magnesium: 2.2 mg/dL (ref 1.7–2.4)

## 2020-09-19 LAB — CBC WITH DIFFERENTIAL/PLATELET
Abs Immature Granulocytes: 0.29 10*3/uL — ABNORMAL HIGH (ref 0.00–0.07)
Abs Immature Granulocytes: 0.25 10*3/uL — ABNORMAL HIGH (ref 0.00–0.07)
Basophils Absolute: 0 10*3/uL (ref 0.0–0.1)
Basophils Absolute: 0 10*3/uL (ref 0.0–0.1)
Basophils Relative: 1 %
Basophils Relative: 0 %
Eosinophils Absolute: 0 10*3/uL (ref 0.0–0.5)
Eosinophils Absolute: 0 10*3/uL (ref 0.0–0.5)
Eosinophils Relative: 0 %
Eosinophils Relative: 0 %
HCT: 38.1 % — ABNORMAL LOW (ref 39.0–52.0)
HCT: 36.5 % — ABNORMAL LOW (ref 39.0–52.0)
Hemoglobin: 11.7 g/dL — ABNORMAL LOW (ref 13.0–17.0)
Hemoglobin: 11.2 g/dL — ABNORMAL LOW (ref 13.0–17.0)
Immature Granulocytes: 4 %
Immature Granulocytes: 4 %
Lymphocytes Relative: 19 %
Lymphocytes Relative: 18 %
Lymphs Abs: 1.3 10*3/uL (ref 0.7–4.0)
Lymphs Abs: 1.2 10*3/uL (ref 0.7–4.0)
MCH: 29.3 pg (ref 26.0–34.0)
MCH: 29.4 pg (ref 26.0–34.0)
MCHC: 30.7 g/dL (ref 30.0–36.0)
MCHC: 30.7 g/dL (ref 30.0–36.0)
MCV: 95.5 fL (ref 80.0–100.0)
MCV: 95.8 fL (ref 80.0–100.0)
Monocytes Absolute: 0.4 10*3/uL (ref 0.1–1.0)
Monocytes Absolute: 0.5 10*3/uL (ref 0.1–1.0)
Monocytes Relative: 6 %
Monocytes Relative: 7 %
Neutro Abs: 4.6 10*3/uL (ref 1.7–7.7)
Neutro Abs: 4.8 10*3/uL (ref 1.7–7.7)
Neutrophils Relative %: 70 %
Neutrophils Relative %: 71 %
Platelets: 242 10*3/uL (ref 150–400)
Platelets: 255 10*3/uL (ref 150–400)
RBC: 3.99 MIL/uL — ABNORMAL LOW (ref 4.22–5.81)
RBC: 3.81 MIL/uL — ABNORMAL LOW (ref 4.22–5.81)
RDW: 17.4 % — ABNORMAL HIGH (ref 11.5–15.5)
RDW: 17.2 % — ABNORMAL HIGH (ref 11.5–15.5)
WBC: 6.6 10*3/uL (ref 4.0–10.5)
WBC: 6.8 10*3/uL (ref 4.0–10.5)
nRBC: 0.5 % — ABNORMAL HIGH (ref 0.0–0.2)
nRBC: 0.4 % — ABNORMAL HIGH (ref 0.0–0.2)

## 2020-09-19 LAB — COMPREHENSIVE METABOLIC PANEL
ALT: 74 U/L — ABNORMAL HIGH (ref 0–44)
AST: 33 U/L (ref 15–41)
Albumin: 2.6 g/dL — ABNORMAL LOW (ref 3.5–5.0)
Alkaline Phosphatase: 60 U/L (ref 38–126)
Anion gap: 8 (ref 5–15)
BUN: 21 mg/dL (ref 8–23)
CO2: 34 mmol/L — ABNORMAL HIGH (ref 22–32)
Calcium: 8.3 mg/dL — ABNORMAL LOW (ref 8.9–10.3)
Chloride: 91 mmol/L — ABNORMAL LOW (ref 98–111)
Creatinine, Ser: 0.59 mg/dL — ABNORMAL LOW (ref 0.61–1.24)
GFR, Estimated: >60 mL/min (ref >60)
Glucose, Bld: 262 mg/dL — ABNORMAL HIGH (ref 70–99)
Potassium: 5.2 mmol/L — ABNORMAL HIGH (ref 3.5–5.1)
Sodium: 133 mmol/L — ABNORMAL LOW (ref 135–145)
Total Bilirubin: 0.5 mg/dL (ref 0.3–1.2)
Total Protein: 6 g/dL — ABNORMAL LOW (ref 6.5–8.1)

## 2020-09-19 LAB — GLUCOSE, CAPILLARY
Glucose-Capillary: 220 mg/dL — ABNORMAL HIGH (ref 70–99)
Glucose-Capillary: 230 mg/dL — ABNORMAL HIGH (ref 70–99)
Glucose-Capillary: 199 mg/dL — ABNORMAL HIGH (ref 70–99)
Glucose-Capillary: 211 mg/dL — ABNORMAL HIGH (ref 70–99)

## 2020-09-19 LAB — LACTATE DEHYDROGENASE: LDH: 228 U/L — ABNORMAL HIGH (ref 98–192)

## 2020-09-19 LAB — C-REACTIVE PROTEIN: CRP: <0.5 mg/dL (ref <1.0)

## 2020-09-19 LAB — FERRITIN: Ferritin: 203 ng/mL (ref 24–336)

## 2020-09-19 LAB — POTASSIUM: Potassium: 4.9 mmol/L (ref 3.5–5.1)

## 2020-09-19 MED ORDER — ALUM & MAG HYDROXIDE-SIMETH 200-200-20 MG/5ML PO SUSP
30.0000 mL | ORAL | Status: DC | PRN
  Administered 2020-09-19 – 2020-09-28 (×6): 30 mL via ORAL
  Filled 2020-09-19 (×6): qty 30

## 2020-09-19 NOTE — Progress Notes (Signed)
Inpatient Rehabilitation Admissions Coordinator  I will place order for rehab consult per protocol and follow up with patient and family tomorrow.  Danne Baxter, RN, MSN Rehab Admissions Coordinator [phone] 09/19/2020 3:47 PM

## 2020-09-19 NOTE — Progress Notes (Signed)
Nutrition Follow-up  DOCUMENTATION CODES:   Severe malnutrition in context of acute illness/injury  INTERVENTION:    Continue Ensure Enlive po TID, each supplement provides 350 kcal and 20 grams of protein  Continue Magic cup TID with meals, each supplement provides 290 kcal and 9 grams of protein  Encourage intake of meals and supplements  NUTRITION DIAGNOSIS:   Severe Malnutrition related to acute illness (COVID19) as evidenced by mild fat depletion, mild muscle depletion, moderate muscle depletion, severe muscle depletion, moderate fat depletion, percent weight loss (13.3% weight loss in less than 1 month).  Ongoing  GOAL:   Patient will meet greater than or equal to 90% of their needs  Progressing  MONITOR:   TF tolerance, PO intake, Labs, Skin  REASON FOR ASSESSMENT:   Consult, Ventilator Enteral/tube feeding initiation and management  ASSESSMENT:   69 yo male admitted with recent COVID-19 diagnosis with worsening respiratory failure and left pneumothorax.  PMH includes DM, HTN, GERD.  PO intake recorded at 100% of all meals (last documented meal 10/17). Ensure Enlive supplements are being offered TID. Patient received all three supplements 10/18, but missed 1-2 per day for the previous 3 days due to decreased alertness or because he was on BiPAP.  He required BiPAP last night. Currently on 4 L HFNC.  Labs reviewed. Na 133, K 5.2, phos 4.7 CBG: [phone]  Medications reviewed and include Colace, Novolog, Lantus, Tradjenta, Solumedrol, Flomax.  Weight trending up slightly over the past week. Currently 83.9 kg  Diet Order:   Diet Order            Diet Carb Modified Fluid consistency: Thin; Room service appropriate? Yes  Diet effective now                 EDUCATION NEEDS:   Not appropriate for education at this time  Skin:  Skin Assessment: Skin Integrity Issues: Skin Integrity Issues:: Stage II Stage I: N/A Stage II: bilateral  buttocks  Last BM:  10/18 type 6  Height:   Ht Readings from Last 1 Encounters:  08/19/20 5\' 10"  (1.778 m)    Weight:   Wt Readings from Last 1 Encounters:  09/18/20 83.9 kg    BMI:  Body mass index is 26.54 kg/m.  Estimated Nutritional Needs:   Kcal:  2200-2400  Protein:  110-130 gm  Fluid:  2-2.2 L    Lucas Mallow, RD, LDN, CNSC Please refer to Amion for contact information.

## 2020-09-19 NOTE — Progress Notes (Signed)
PROGRESS NOTE  As per Dr. Jamse Arn: 69 year old with recent Covid pneumonia (September 7th through 38SN) complicated by bilateral DVTs, was admitted 08/19/2020 with left pneumothorax requiring chest tube placement and subsequent intubation 08/20/2020.  He was extubated the following day but has persistent bilateral infiltrates is presently requiring ongoing high flow nasal cannula 10 to 15 L oxygen.  Hospital course is complicated by urinary retention presently on Foley catheter.  He also had an episode of sudden loss of vision in his right eye which has resolved.  Neurology work-up is negative.  Emry Barbato Auld  KNL:[id number] DOB: 1951-06-09 DOA: 08/19/2020 PCP: Celene Squibb, MD   Assessment & Plan:   Active Problems:   DM2 (diabetes mellitus, type 2) ([place])   Benign essential HTN   Dyslipidemia   Chest tube in place   COVID-19 virus infection   Acute hypoxemic respiratory failure due to COVID-19 [facility])   Pneumothorax, left   Pneumonia due to COVID-19 virus   Leg DVT (deep venous thromboembolism), acute, bilateral ([facility])   Pressure injury of skin   Acute respiratory failure with hypoxia ([place])   HCAP (healthcare-associated pneumonia)   Primary spontaneous pneumothorax   Protein-calorie malnutrition, severe   Adjustment disorder with anxious mood   Acute hypoxic respiratory failure with hypercarbia : secondary to recent COVID-19 pneumonia (Sept 2021) s/p left pneumothorax. Continue on supplemental oxygen and wean as tolerated, currently on HFNC, currently 8L. Improving.  09/17/2020: Continue w/ bronchodilators, chest PT & incentive spirometry. BiPAP qhs. Pt is still tachypnea and will add solumedrol and sch albuterol nebs for next two days every 8 hours. SpO2: 97 % O2 Flow Rate (L/min): 4 L/min FiO2 (%): 40 % Patient's last chest x-ray reviewed.  Due to persistent hypoxia we will also repeat CTA of the chest. Per VBG pt has hypercarbic respiratory failure and we will start  bipap. 09/18/2020: PT seen today for f/u he is clinically doing better since  Yesterday and was started on bipap for hi hypercarbia.We will continue iv steroids for 5 days and then taper thereafter with long taper. SpO2: 97 % O2 Flow Rate (L/min): 4 L/min FiO2 (%): 40 % We will repeat ABG and follow.  Pt is alert and oriented and his face appears flat but pt follows commands.    Depression: severity unknown. Ativan, hydroxyzine prn as per psych.   Urinary retention: continue w/ foley as pt has failed voiding trials x 2. Continue on tamsulosin. Continue w/ foley until acute illness resolved.   Hyponatremia: Serum sodium yesterday 133 and stable will continue to monitor.   Hyperkalemia: we will recheck.   Bilateral DVTs: continue on eliquis. Likely secondary to recent COVID19 infection.  Protein calorie malnutrition: continue w/ nutritional supplements  Sudden unilateral vision loss: resolved spontaneously. MRI brain neg. Would have low threshold for ophthalmology evaluation if this recurs.  Patient has had a negative carotid Doppler, negative brain MRI, with an echocardiogram showing: 1. Left ventricular ejection fraction, by estimation, is 60 to 65%. The left ventricle has normal function. The left ventricle has no regional wall motion abnormalities. There is mild concentric left ventricular hypertrophy. Left ventricular diastolic parameters are consistent with Grade I diastolic dysfunction (impaired relaxation). 2. Right ventricular systolic function is normal. The right ventricular size is normal. 3. The mitral valve is normal in structure. No evidence of mitral valve regurgitation. No evidence of mitral stenosis. 4. The aortic valve is tricuspid. There is mild calcification of the aortic valve. There is mild thickening of the  aortic valve. Aortic valve regurgitation is not visualized. No aortic stenosis is present. 5. Aortic dilatation noted. There is mild dilatation at the level  of the sinuses of Valsalva, measuring 41 mm. There is mild dilatation of the ascending aorta. 6. The inferior vena cava is normal in size with greater than 50% respiratory variability, suggesting right atrial pressure of 3 mmHg.  Do not suspect an embolic source. Pt has been seen by Dr.Falk from cardiology.   GERD: continue on PPI  Swelling of both eyelids: Will start pt on empiric abx eye drops.     DVT prophylaxis: eliquis  Code Status: full  Family Communication: discussed pt's care w/ pt's family at bedside and answered their questions  Disposition Plan:  Likely d/c to CIR   Status is: Inpatient  Remains inpatient appropriate because:IV treatments appropriate due to intensity of illness or inability to take PO and Inpatient level of care appropriate due to severity of illness, still on HFNC at 8L so too much to go to CIR with, continue to wean supplemental oxygen as tolerated.    Dispo: The patient is from: Home              Anticipated d/c is to: CIR              Anticipated d/c date is: > 3 days              Patient currently is not medically stable to d/c.  Consultants:  Psychiatry : Waylan Boga. Cardiology: Dr.Falk  Procedures:  Echocardiogram:08/26/2020  Antimicrobials:  Anti-infectives (From admission, onward)   Start     Dose/Rate Route Frequency Ordered Stop   09/17/20 1730  piperacillin-tazobactam (ZOSYN) IVPB 3.375 g  Status:  Discontinued        3.375 g 12.5 mL/hr over 240 Minutes Intravenous Every 8 hours 09/17/20 1635 09/17/20 1928   09/03/20 2100  cefTRIAXone (ROCEPHIN) 2 g in sodium chloride 0.9 % 100 mL IVPB  Status:  Discontinued        2 g 200 mL/hr over 30 Minutes Intravenous Every 24 hours 09/03/20 1802 09/04/20 1443   08/31/20 2230  sulfamethoxazole-trimethoprim (BACTRIM DS) 800-160 MG per tablet 1 tablet  Status:  Discontinued        1 tablet Oral Every 12 hours 08/31/20 2201 09/06/20 1548   08/19/20 2000  piperacillin-tazobactam (ZOSYN) IVPB  3.375 g        3.375 g 12.5 mL/hr over 240 Minutes Intravenous Every 8 hours 08/19/20 1850 08/23/20 2359   08/19/20 1845  piperacillin-tazobactam (ZOSYN) IVPB 3.375 g  Status:  Discontinued        3.375 g 100 mL/hr over 30 Minutes Intravenous  Once 08/19/20 1838 08/19/20 1850   08/19/20 0900  vancomycin (VANCOREADY) IVPB 1500 mg/300 mL        1,500 mg 150 mL/hr over 120 Minutes Intravenous  Once 08/19/20 0831 08/19/20 1303   08/19/20 0845  piperacillin-tazobactam (ZOSYN) IVPB 3.375 g        3.375 g 100 mL/hr over 30 Minutes Intravenous  Once 08/19/20 0831 08/19/20 1103       Subjective: Pt c/o shortness of breath unchanged from day prior.   09/17/2020: Pt seen today he is alert,awake, oriented and answers questions. He appears anxious or stiff.  Wife is at bedside and states that his eyes have been Swollen for the past few days and was requesting antibiotics.  Patient is alert and awake and denies any complaints of chest pain or shortness of  breath any stomach symptoms diarrhea nausea vomiting or headache.  He answers questions appropriately, mood affect and behavior is within normal limits.  Therapy is working with patient and he was out of bed yesterday but does need assistance to ambulate and will need aggressive rehab and physical therapy.  Suspect is persistent respiratory failure with hypoxia complication of his [id number] pneumonia.  Chart reviewed.  Labs show that patient has developed anemia since September of this year.  Aggressive patchy airspace disease within left upper lobe.  CT of the chest was done on 7 September showing bilateral groundglass airspace disease consistent with his COVID-19 pneumonia minimal pneumomediastinum consistent with barotrauma.  09/18/2020: Pt seen today and is alert and awake and oriented and d/w wife about plan and also he has worked with pt today and RR today is stable.   09/19/2020: Pt seen today for f/u he is improving and sounds much clearer  although his affect remains flat. Pt is working with pt aggressively and pt needs to be encouraged .  Objective: Vitals:   09/19/20 0446 09/19/20 0803 09/19/20 0859 09/19/20 1138  BP: (!) 138/92 110/76 110/76 114/76  Pulse: 88 92 90 (!) 107  Resp: (!) 29 12 (!) 27 (!) 41  Temp: 98 F (36.7 C) (!) 96.4 F (35.8 C)  (!) 97.4 F (36.3 C)  TempSrc: Axillary Axillary  Oral  SpO2: 100% 98% 99% 97%  Weight:      Height:        Intake/Output Summary (Last 24 hours) at 09/19/2020 1533 Last data filed at 09/19/2020 1000 Gross per 24 hour  Intake 477 ml  Output 3050 ml  Net -2573 ml   Filed Weights   09/16/20 0511 09/17/20 0459 09/18/20 0345  Weight: 83.5 kg (!) 189 kg 83.9 kg    Examination: General exam: Appears calm and comfortable  Respiratory system: Bilateral inspiratory and expiratory rhonchi are resolved. Cardiovascular system: S1 & S2 +. No  rubs, gallops or clicks. Gastrointestinal system: Abdomen is nondistended, soft and nontender. Hypoactive bowel sounds heard. Central nervous system: Alert and oriented. Moves all 4 extremities Psychiatry: Judgement and insight appear normal. Flat mood and affect   Data Reviewed: I have personally reviewed following labs and imaging studies  CBC: Recent Labs  Lab 09/14/20 0240 09/17/20 0203 09/19/20 0629 09/19/20 0758  WBC 6.3 7.3 6.6 6.8  NEUTROABS 3.7  --  4.6 4.8  HGB 10.7* 10.4* 11.7* 11.2*  HCT 34.0* 34.1* 38.1* 36.5*  MCV 93.9 93.7 95.5 95.8  PLT 250 242 242 798   Basic Metabolic Panel: Recent Labs  Lab 09/14/20 0240 09/15/20 0232 09/16/20 0213 09/17/20 0203 09/19/20 0629 09/19/20 0758  NA 131* 132* 133* 132* 133*  --   K 4.7 4.5 4.6 4.7 5.2*  --   CL 89* 91* 92* 92* 91*  --   CO2 34* 32 32 34* 34*  --   GLUCOSE 188* 114* 88 168* 262*  --   BUN 18 15 14 15 21   --   CREATININE 0.54* 0.53* 0.57* 0.50* 0.59*  --   CALCIUM 8.1* 7.9* 7.9* 8.0* 8.3*  --   MG 1.8  --   --   --  2.2 2.2  PHOS  --   --   --   --   4.7* 4.3   GFR: Estimated Creatinine Clearance: 92.5 mL/min (A) (by C-G formula based on SCr of 0.59 mg/dL (L)). Liver Function Tests: Recent Labs  Lab 09/14/20 0240 09/15/20 0232 09/16/20 9211  09/17/20 0203 09/19/20 0629  AST 36 33 37 26 33  ALT 67* 67* 72* 63* 74*  ALKPHOS 65 53 54 54 60  BILITOT 0.6 0.9 0.5 0.4 0.5  PROT 5.3* 5.5* 5.6* 5.3* 6.0*  ALBUMIN 2.2* 2.2* 2.2* 2.2* 2.6*   CBG: Recent Labs  Lab 09/18/20 1130 09/18/20 1614 09/18/20 2153 09/19/20 0805 09/19/20 1136  GLUCAP 207* 304* 173* 220* 230*   Radiology Studies: No results found.  Scheduled Meds: . albuterol  2 puff Inhalation BID  . apixaban  5 mg Oral BID  . chlorhexidine  15 mL Mouth Rinse BID  . Chlorhexidine Gluconate Cloth  6 each Topical Daily  . collagenase   Topical BID  . docusate sodium  100 mg Oral BID  . feeding supplement  237 mL Oral TID BM  . hydrocortisone cream   Topical BID  . hydroxypropyl methylcellulose / hypromellose  2 drop Both Eyes QID  . insulin aspart  0-15 Units Subcutaneous TID WC  . insulin aspart  0-5 Units Subcutaneous QHS  . insulin aspart  15 Units Subcutaneous TID WC  . insulin glargine  30 Units Subcutaneous BID  . linagliptin  5 mg Oral Daily  . mouth rinse  15 mL Mouth Rinse q12n4p  . melatonin  3 mg Oral QHS  . methylPREDNISolone (SOLU-MEDROL) injection  40 mg Intravenous Q12H  . pantoprazole  40 mg Oral Daily  . tamsulosin  0.4 mg Oral Daily  . tobramycin  1 drop Both Eyes Q12H   Continuous Infusions: . sodium chloride       LOS: 31 days   Time spent: 20 mins   Para Skeans, MD [facility] Pager [phone] If 7PM-7AM, please contact night-coverage [email or web address] 09/19/2020, 3:33 PM

## 2020-09-19 NOTE — Progress Notes (Signed)
RT placed patient on BIPAP. Patient tolerating well at this time. RT will monitor as needed. 

## 2020-09-19 NOTE — Progress Notes (Signed)
Occupational Therapy Treatment Patient Details Name: Tim Day MRN: [id number] DOB: [birth year] Today's Date: 09/19/2020    History of present illness 69 year old with hx of recent COVID pneumonia diagnosis (9/7-9/13, discharge on 2L O2), BL COVID induced DVTs, DM2, GERD HTN presenting with worsening SOB. Per chart found to have left pneumothorax and worsening bilateral infiltrates. Intubated 9/19-9/20, Chest tube removed 9/28.    OT comments  Pt seen on 4L HFNC for a 45 min session with increased activity tolerance overall. SpO2 desat @ 85 with Max HR 111 with increased activity. Increased RR and requires cues for pursed lip breathing and slowing RR, however, less anxiety this session with activity. Long discussion with pt/wfie regarding need to progress activity and for pt to begin doing more for himself to facilitate recovery. Educated pt/wfie on need to be OOB in chair with feet down for periods during the day. Initiated level 1 theraband HEP. Pt would like to go outdoors - messaged MD. Will continue to follow acutely. Pt will need extensive post acute rehab.   Follow Up Recommendations  CIR;Supervision/Assistance - 24 hour    Equipment Recommendations  3 in 1 bedside commode;Tub/shower seat;Wheelchair cushion (measurements OT);Wheelchair (measurements OT);Hospital bed    Recommendations for Other Services Rehab consult    Precautions / Restrictions Precautions Precautions: Fall Precaution Comments: High RR; watch O2 Restrictions Weight Bearing Restrictions: No       Mobility Bed Mobility Overal bed mobility: Needs Assistance Bed Mobility: Supine to Sit     Supine to sit: Mod assist;HOB elevated        Transfers Overall transfer level: Needs assistance   Transfers: Sit to/from Stand Sit to Stand: Mod assist;+2 physical assistance         General transfer comment: min A +2 from paddles    Balance     Sitting balance-Leahy Scale: Fair        Standing balance-Leahy Scale: Poor                             ADL either performed or assessed with clinical judgement   ADL Overall ADL's : Needs assistance/impaired Eating/Feeding: Set up;Sitting   Grooming: Oral care;Wash/dry face;Applying deodorant;Set up;Sitting   Upper Body Bathing: Sitting;Set up (simulated)   Lower Body Bathing: Moderate assistance;Bed level Lower Body Bathing Details (indicate cue type and reason): simulated by raising HOB and using figure four positioning of BLE     Lower Body Dressing: Maximal assistance Lower Body Dressing Details (indicate cue type and reason): figure four positioning use for pt to help pull socks up over heels             Functional mobility during ADLs: Moderate assistance;+2 for physical assistance;Cueing for safety;Cueing for sequencing General ADL Comments: Educated pt/wife on importance of pt doing more for himself. Wife has been feeding pt. Discussion regarding long illness and necessary change in behavior by family/pt to do more and become more self reliant on self care tasks. Wife/pt verbalized understanding. Discussed goal of pt doing his own UB bathing while staff assists with LB     Vision   Additional Comments: thick eye drops prior to session   Perception     Praxis      Cognition Arousal/Alertness: Awake/alert Behavior During Therapy: Flat affect;Anxious Overall Cognitive Status: Impaired/Different from baseline Area of Impairment: Attention;Safety/judgement;Awareness;Problem solving;Memory  Current Attention Level: Selective Memory: Decreased short-term memory   Safety/Judgement: Decreased awareness of safety;Decreased awareness of deficits Awareness: Emergent Problem Solving: Slow processing          Exercises Exercises: General Upper Extremity;Other exercises General Exercises - Upper Extremity Shoulder Flexion: Strengthening;AROM;Both;10 reps;Seated Shoulder  ABduction: AROM;Both;10 reps;Seated Shoulder Horizontal ABduction: AROM;5 reps;Theraband Theraband Level (Shoulder Horizontal Abduction): Level 1 (Yellow) Elbow Flexion: AROM;Theraband;Strengthening;5 reps;Both Theraband Level (Elbow Flexion): Level 1 (Yellow) Elbow Extension: Strengthening;AROM;Both;5 reps;Theraband Theraband Level (Elbow Extension): Level 1 (Yellow) General Exercises - Lower Extremity Short Arc Quad: Both;10 reps;Seated Hip Flexion/Marching: 10 reps;Seated;Both Toe Raises: Both;10 reps;Seated Heel Raises: Both;10 reps;Seated Other Exercises Other Exercises: modified sit - stand in St[place] x 3   Shoulder Instructions       General Comments      Pertinent Vitals/ Pain       Pain Assessment: No/denies pain  Home Living                                          Prior Functioning/Environment              Frequency  Min 2X/week        Progress Toward Goals  OT Goals(current goals can now be found in the care plan section)  Progress towards OT goals: Progressing toward goals  Acute Rehab OT Goals Patient Stated Goal: to get better OT Goal Formulation: With patient/family Time For Goal Achievement: 09/26/20 Potential to Achieve Goals: Good ADL Goals Pt Will Perform Grooming: with supervision;sitting Pt Will Perform Upper Body Bathing: with supervision;sitting Pt Will Perform Lower Body Bathing: with modified independence Pt Will Perform Upper Body Dressing: with supervision;sitting Pt Will Transfer to Toilet: with min assist;stand pivot transfer;bedside commode Pt/caregiver will Perform Home Exercise Program: Increased strength;Both right and left upper extremity;With written HEP provided;With theraband Additional ADL Goal #1: pt will verbalize and demonstrate two energy conservation techniques  Plan Discharge plan remains appropriate    Co-evaluation                 AM-PAC OT "6 Clicks" Daily Activity     Outcome  Measure   Help from another person eating meals?: A Little Help from another person taking care of personal grooming?: A Little Help from another person toileting, which includes using toliet, bedpan, or urinal?: A Lot Help from another person bathing (including washing, rinsing, drying)?: A Lot Help from another person to put on and taking off regular upper body clothing?: A Lot Help from another person to put on and taking off regular lower body clothing?: A Lot 6 Click Score: 14    End of Session Equipment Utilized During Treatment: Oxygen (4L HFNC)  OT Visit Diagnosis: Unsteadiness on feet (R26.81);Other abnormalities of gait and mobility (R26.89);Muscle weakness (generalized) (M62.81);Other symptoms and signs involving cognitive function   Activity Tolerance Patient tolerated treatment well   Patient Left in chair;with call bell/phone within reach;with family/visitor present   Nurse Communication Mobility status;Need for lift equipment;Other (comment) (encourage pt ot do more for himself; increase time in chair)        Time: 3545-6256 OT Time Calculation (min): 46 min  Charges: OT General Charges $OT Visit: 1 Visit OT Treatments $Self Care/Home Management : 8-22 mins $Therapeutic Activity: 8-22 mins $Therapeutic Exercise: 8-22 mins  Maurie Boettcher, OT/L   Acute OT Clinical Specialist [place] Pager [phone] Office  [place] 09/19/2020, 11:21 AM

## 2020-09-20 LAB — CBC WITH DIFFERENTIAL/PLATELET
Abs Immature Granulocytes: 0.39 10*3/uL — ABNORMAL HIGH (ref 0.00–0.07)
Basophils Absolute: 0 10*3/uL (ref 0.0–0.1)
Basophils Relative: 1 %
Eosinophils Absolute: 0 10*3/uL (ref 0.0–0.5)
Eosinophils Relative: 0 %
HCT: 37.2 % — ABNORMAL LOW (ref 39.0–52.0)
Hemoglobin: 11.2 g/dL — ABNORMAL LOW (ref 13.0–17.0)
Immature Granulocytes: 5 %
Lymphocytes Relative: 16 %
Lymphs Abs: 1.1 10*3/uL (ref 0.7–4.0)
MCH: 28.5 pg (ref 26.0–34.0)
MCHC: 30.1 g/dL (ref 30.0–36.0)
MCV: 94.7 fL (ref 80.0–100.0)
Monocytes Absolute: 0.3 10*3/uL (ref 0.1–1.0)
Monocytes Relative: 4 %
Neutro Abs: 5.3 10*3/uL (ref 1.7–7.7)
Neutrophils Relative %: 74 %
Platelets: 234 10*3/uL (ref 150–400)
RBC: 3.93 MIL/uL — ABNORMAL LOW (ref 4.22–5.81)
RDW: 17.2 % — ABNORMAL HIGH (ref 11.5–15.5)
WBC: 7.2 10*3/uL (ref 4.0–10.5)
nRBC: 0.6 % — ABNORMAL HIGH (ref 0.0–0.2)

## 2020-09-20 LAB — C-REACTIVE PROTEIN: CRP: 0.6 mg/dL (ref <1.0)

## 2020-09-20 LAB — BASIC METABOLIC PANEL
Anion gap: 9 (ref 5–15)
BUN: 20 mg/dL (ref 8–23)
CO2: 34 mmol/L — ABNORMAL HIGH (ref 22–32)
Calcium: 8.6 mg/dL — ABNORMAL LOW (ref 8.9–10.3)
Chloride: 92 mmol/L — ABNORMAL LOW (ref 98–111)
Creatinine, Ser: 0.57 mg/dL — ABNORMAL LOW (ref 0.61–1.24)
GFR, Estimated: >60 mL/min (ref >60)
Glucose, Bld: 224 mg/dL — ABNORMAL HIGH (ref 70–99)
Potassium: 5 mmol/L (ref 3.5–5.1)
Sodium: 135 mmol/L (ref 135–145)

## 2020-09-20 LAB — GLUCOSE, CAPILLARY
Glucose-Capillary: 201 mg/dL — ABNORMAL HIGH (ref 70–99)
Glucose-Capillary: 223 mg/dL — ABNORMAL HIGH (ref 70–99)
Glucose-Capillary: 194 mg/dL — ABNORMAL HIGH (ref 70–99)
Glucose-Capillary: 152 mg/dL — ABNORMAL HIGH (ref 70–99)

## 2020-09-20 LAB — FERRITIN: Ferritin: 152 ng/mL (ref 24–336)

## 2020-09-20 LAB — MAGNESIUM: Magnesium: 2.1 mg/dL (ref 1.7–2.4)

## 2020-09-20 LAB — LACTATE DEHYDROGENASE: LDH: 230 U/L — ABNORMAL HIGH (ref 98–192)

## 2020-09-20 LAB — PHOSPHORUS: Phosphorus: 3.7 mg/dL (ref 2.5–4.6)

## 2020-09-20 MED ORDER — PREDNISONE 20 MG PO TABS
40.0000 mg | ORAL_TABLET | Freq: Every day | ORAL | Status: DC
  Administered 2020-09-21 – 2020-09-23 (×3): 40 mg via ORAL
  Filled 2020-09-20 (×4): qty 2

## 2020-09-20 MED ORDER — INSULIN GLARGINE 100 UNIT/ML ~~LOC~~ SOLN
32.0000 [IU] | Freq: Two times a day (BID) | SUBCUTANEOUS | Status: DC
  Administered 2020-09-20 – 2020-09-21 (×3): 32 [IU] via SUBCUTANEOUS
  Filled 2020-09-20 (×5): qty 0.32

## 2020-09-20 NOTE — Progress Notes (Signed)
PROGRESS NOTE    Tim Day  VQQ:[id number] DOB: 1951-09-10 DOA: 08/19/2020 PCP: Celene Squibb, MD     Brief Narrative:  Tim Day is a 69 year old with recent Covid pneumonia (September 7th through 43PI), complicated by bilateral DVTs, was admitted 08/19/2020 with left pneumothorax requiring chest tube placement and subsequent intubation 08/20/2020. He was extubated the following day but has persistent bilateral infiltrates is presently requiring ongoing high flow nasal cannula 10 to 15 L oxygen. Hospital course is complicated by urinary retention presently on Foley catheter. He also had an episode of sudden loss of vision in his right eye which has resolved. Neurology work-up is negative.   New events last 24 hours / Subjective: Patient doing well this morning, oxygen weaned down to 4L this morning, further 3L during my exam and satting 98% without distress. Plan is for CIR, awaiting insurance auth.   Assessment & Plan:   Active Problems:   DM2 (diabetes mellitus, type 2) ([place])   Benign essential HTN   Dyslipidemia   Chest tube in place   COVID-19 virus infection   Acute hypoxemic respiratory failure due to COVID-19 [facility])   Pneumothorax, left   Pneumonia due to COVID-19 virus   Leg DVT (deep venous thromboembolism), acute, bilateral (HCC)   Pressure injury of skin   Acute respiratory failure with hypoxia ([place])   HCAP (healthcare-associated pneumonia)   Primary spontaneous pneumothorax   Protein-calorie malnutrition, severe   Adjustment disorder with anxious mood   Acute hypoxic and hypercarbic respiratory failure  -Secondary to recent COVID-19 pneumonia (Sept 2021) s/p left pneumothorax -Weaned down to 3 L nasal cannula O2 this morning -BiPAP nightly due to hypercarbia overnight -Wean Solu-Medrol to prednisone  Acute urinary retention -Continue Foley catheter, patient has failed voiding trials x2 -Continue tamsulosin  Bilateral DVT -Continue  Eliquis  Diabetes mellitus type 2 -Continue Lantus, sliding scale insulin, Tradjenta  Depression/anxiety -Continue Ativan as needed  Sudden unilateral vision loss -MRI brain neg -Resolved spontaneously   In agreement with assessment of the pressure ulcer as below:  Pressure Injury 08/19/20 Buttocks Bilateral Stage 2 -  Partial thickness loss of dermis presenting as a shallow open injury with a red, pink wound bed without slough. (Active)  08/19/20 1853  Location: Buttocks  Location Orientation: Bilateral  Staging: Stage 2 -  Partial thickness loss of dermis presenting as a shallow open injury with a red, pink wound bed without slough.  Wound Description (Comments):   Present on Admission: Yes     Nutrition Problem: Severe Malnutrition Etiology: acute illness (COVID19)   DVT prophylaxis:  SCDs Start: 08/19/20 1842 apixaban (ELIQUIS) tablet 5 mg  Code Status: Full code Family Communication: Family member at bedside Disposition Plan:  Status is: Inpatient  Remains inpatient appropriate because:Unsafe d/c plan   Dispo: The patient is from: Home              Anticipated d/c is to: CIR              Anticipated d/c date is: 1 day              Patient currently is medically stable to d/c.  Awaiting insurance authorization for CIR admission    Antimicrobials:  Anti-infectives (From admission, onward)   Start     Dose/Rate Route Frequency Ordered Stop   09/17/20 1730  piperacillin-tazobactam (ZOSYN) IVPB 3.375 g  Status:  Discontinued        3.375 g 12.5 mL/hr over 240 Minutes  Intravenous Every 8 hours 09/17/20 1635 09/17/20 1928   09/03/20 2100  cefTRIAXone (ROCEPHIN) 2 g in sodium chloride 0.9 % 100 mL IVPB  Status:  Discontinued        2 g 200 mL/hr over 30 Minutes Intravenous Every 24 hours 09/03/20 1802 09/04/20 1443   08/31/20 2230  sulfamethoxazole-trimethoprim (BACTRIM DS) 800-160 MG per tablet 1 tablet  Status:  Discontinued        1 tablet Oral Every 12 hours  08/31/20 2201 09/06/20 1548   08/19/20 2000  piperacillin-tazobactam (ZOSYN) IVPB 3.375 g        3.375 g 12.5 mL/hr over 240 Minutes Intravenous Every 8 hours 08/19/20 1850 08/23/20 2359   08/19/20 1845  piperacillin-tazobactam (ZOSYN) IVPB 3.375 g  Status:  Discontinued        3.375 g 100 mL/hr over 30 Minutes Intravenous  Once 08/19/20 1838 08/19/20 1850   08/19/20 0900  vancomycin (VANCOREADY) IVPB 1500 mg/300 mL        1,500 mg 150 mL/hr over 120 Minutes Intravenous  Once 08/19/20 0831 08/19/20 1303   08/19/20 0845  piperacillin-tazobactam (ZOSYN) IVPB 3.375 g        3.375 g 100 mL/hr over 30 Minutes Intravenous  Once 08/19/20 0831 08/19/20 1103        Objective: Vitals:   09/20/20 0444 09/20/20 0815 09/20/20 0831 09/20/20 0835  BP: 130/81  125/83 125/83  Pulse: 88 (!) 101 (!) 101   Resp: (!) 21 (!) 24 20 20   Temp: 98.2 F (36.8 C)  97.7 F (36.5 C) 97.7 F (36.5 C)  TempSrc: Axillary   Oral  SpO2: 97% 97%    Weight:      Height:        Intake/Output Summary (Last 24 hours) at 09/20/2020 1126 Last data filed at 09/20/2020 0451 Gross per 24 hour  Intake 477 ml  Output 3050 ml  Net -2573 ml   Filed Weights   09/16/20 0511 09/17/20 0459 09/18/20 0345  Weight: 83.5 kg (!) 189 kg 83.9 kg    Examination:  General exam: Appears calm and comfortable  Respiratory system: Clear to auscultation. Respiratory effort normal. No respiratory distress. No conversational dyspnea.  Weaned to 3 L nasal cannula O2 Cardiovascular system: S1 & S2 heard, RRR. No murmurs. No pedal edema. Gastrointestinal system: Abdomen is nondistended, soft and nontender. Normal bowel sounds heard. Central nervous system: Alert and oriented. No focal neurological deficits. Speech clear.  Extremities: Symmetric in appearance  Skin: No rashes, lesions or ulcers on exposed skin  Psychiatry: Judgement and insight appear normal. Mood & affect appropriate.   Data Reviewed: I have personally reviewed  following labs and imaging studies  CBC: Recent Labs  Lab 09/14/20 0240 09/17/20 0203 09/19/20 0629 09/19/20 0758 09/20/20 0334  WBC 6.3 7.3 6.6 6.8 7.2  NEUTROABS 3.7  --  4.6 4.8 5.3  HGB 10.7* 10.4* 11.7* 11.2* 11.2*  HCT 34.0* 34.1* 38.1* 36.5* 37.2*  MCV 93.9 93.7 95.5 95.8 94.7  PLT 250 242 242 255 619   Basic Metabolic Panel: Recent Labs  Lab 09/14/20 0240 09/14/20 0240 09/15/20 0232 09/15/20 0232 09/16/20 0213 09/17/20 0203 09/19/20 0629 09/19/20 0758 09/19/20 1617 09/20/20 0334 09/20/20 0747  NA 131*   < > 132*  --  133* 132* 133*  --   --   --  135  K 4.7   < > 4.5   < > 4.6 4.7 5.2*  --  4.9  --  5.0  CL 89*   < > 91*  --  92* 92* 91*  --   --   --  92*  CO2 34*   < > 32  --  32 34* 34*  --   --   --  34*  GLUCOSE 188*   < > 114*  --  88 168* 262*  --   --   --  224*  BUN 18   < > 15  --  14 15 21   --   --   --  20  CREATININE 0.54*   < > 0.53*  --  0.57* 0.50* 0.59*  --   --   --  0.57*  CALCIUM 8.1*   < > 7.9*  --  7.9* 8.0* 8.3*  --   --   --  8.6*  MG 1.8  --   --   --   --   --  2.2 2.2  --  2.1  --   PHOS  --   --   --   --   --   --  4.7* 4.3  --  3.7  --    < > = values in this interval not displayed.   GFR: Estimated Creatinine Clearance: 92.5 mL/min (A) (by C-G formula based on SCr of 0.57 mg/dL (L)). Liver Function Tests: Recent Labs  Lab 09/14/20 0240 09/15/20 0232 09/16/20 0213 09/17/20 0203 09/19/20 0629  AST 36 33 37 26 33  ALT 67* 67* 72* 63* 74*  ALKPHOS 65 53 54 54 60  BILITOT 0.6 0.9 0.5 0.4 0.5  PROT 5.3* 5.5* 5.6* 5.3* 6.0*  ALBUMIN 2.2* 2.2* 2.2* 2.2* 2.6*   No results for input(s): LIPASE, AMYLASE in the last 168 hours. No results for input(s): AMMONIA in the last 168 hours. Coagulation Profile: No results for input(s): INR, PROTIME in the last 168 hours. Cardiac Enzymes: Recent Labs  Lab 09/18/20 0341  CKTOTAL 30*   BNP (last 3 results) No results for input(s): PROBNP in the last 8760 hours. HbA1C: No results  for input(s): HGBA1C in the last 72 hours. CBG: Recent Labs  Lab 09/19/20 0805 09/19/20 1136 09/19/20 1608 09/19/20 2322 09/20/20 0732  GLUCAP 220* 230* 199* 211* 201*   Lipid Profile: No results for input(s): CHOL, HDL, LDLCALC, TRIG, CHOLHDL, LDLDIRECT in the last 72 hours. Thyroid Function Tests: Recent Labs    09/17/20 1249  TSH 2.161  FREET4 0.79   Anemia Panel: Recent Labs    09/17/20 1249 09/17/20 1631 09/19/20 0629 09/20/20 0334  VITAMINB12 331  --   --   --   FOLATE 7.3  --   --   --   FERRITIN 180   < > 203 152  TIBC 291  --   --   --   IRON 48  --   --   --   RETICCTPCT 9.3*  --   --   --    < > = values in this interval not displayed.   Sepsis Labs: Recent Labs  Lab 09/17/20 1631 09/18/20 0341  PROCALCITON <0.10 <0.10    No results found for this or any previous visit (from the past 240 hour(s)).    Radiology Studies: No results found.    Scheduled Meds: . albuterol  2 puff Inhalation BID  . apixaban  5 mg Oral BID  . chlorhexidine  15 mL Mouth Rinse BID  . Chlorhexidine Gluconate Cloth  6 each Topical Daily  . collagenase  Topical BID  . docusate sodium  100 mg Oral BID  . feeding supplement  237 mL Oral TID BM  . hydrocortisone cream   Topical BID  . hydroxypropyl methylcellulose / hypromellose  2 drop Both Eyes QID  . insulin aspart  0-15 Units Subcutaneous TID WC  . insulin aspart  0-5 Units Subcutaneous QHS  . insulin aspart  15 Units Subcutaneous TID WC  . insulin glargine  30 Units Subcutaneous BID  . linagliptin  5 mg Oral Daily  . mouth rinse  15 mL Mouth Rinse q12n4p  . melatonin  3 mg Oral QHS  . methylPREDNISolone (SOLU-MEDROL) injection  40 mg Intravenous Q12H  . pantoprazole  40 mg Oral Daily  . tamsulosin  0.4 mg Oral Daily  . tobramycin  1 drop Both Eyes Q12H   Continuous Infusions: . sodium chloride       LOS: 32 days      Time spent: 35 minutes   Dessa Phi, DO [facility] 09/20/2020, 11:26  AM   Available via Epic secure chat 7am-7pm After these hours, please refer to coverage provider listed on [email or web address]

## 2020-09-20 NOTE — Progress Notes (Signed)
Inpatient Diabetes Program Recommendations  AACE/ADA: New Consensus Statement on Inpatient Glycemic Control (2015)  Target Ranges:  Prepandial:   less than 140 mg/dL      Peak postprandial:   less than 180 mg/dL (1-2 hours)      Critically ill patients:  140 - 180 mg/dL   Lab Results  Component Value Date   GLUCAP 223 (H) 09/20/2020   HGBA1C 7.2 (H) 08/08/2020    Review of Glycemic Control Results for Tim Day, Tim Day (MRN [id number]) as of 09/20/2020 13:41  Ref. Range 09/19/2020 08:05 09/19/2020 11:36 09/19/2020 16:08 09/19/2020 23:22 09/20/2020 07:32 09/20/2020 11:48  Glucose-Capillary Latest Ref Range: 70 - 99 mg/dL 220 (H) 230 (H) 199 (H) 211 (H) 201 (H) 223 (H)    Inpatient Diabetes Program Recommendations:    Lantus 32 units bid  Will continue to follow while inpatient.  Thank you, Reche Dixon, RN, BSN Diabetes Coordinator Inpatient Diabetes Program [phone] (team pager from 8a-5p)

## 2020-09-20 NOTE — Progress Notes (Signed)
Physical Therapy Treatment Patient Details Name: Tim Day MRN: [id number] DOB: 22-Sep-1951 Today's Date: 09/20/2020    History of Present Illness 69 year old with hx of recent COVID pneumonia diagnosis (9/7-9/13, discharge on 2L O2), BL COVID induced DVTs, DM2, GERD HTN presenting with worsening SOB. Per chart found to have left pneumothorax and worsening bilateral infiltrates. Intubated 9/19-9/20, Chest tube removed 9/28.     PT Comments    Pt making steady progress. Able to take a few steps today with walker with 2 person assist. Continue to recommend CIR.    Follow Up Recommendations  CIR     Equipment Recommendations  Rolling walker with 5" wheels;Wheelchair (measurements PT);3in1 (PT);Wheelchair cushion (measurements PT);Other (comment) (further assessment next venue)    Recommendations for Other Services       Precautions / Restrictions Precautions Precautions: Fall Precaution Comments: High RR    Mobility  Bed Mobility Overal bed mobility: Needs Assistance Bed Mobility: Rolling;Sidelying to Sit Rolling: Min assist Sidelying to sit: Mod assist;HOB elevated       General bed mobility comments: Assist to elevate trunk into sitting and bring hips to EOB.   Transfers Overall transfer level: Needs assistance Equipment used: Rolling walker (2 wheeled);Ambulation equipment used Transfers: Sit to/from Stand Sit to Stand: Mod assist;+2 physical assistance;Min assist         General transfer comment: Assist to bring hips up and for balance. Stood x 3 with [place] and x2 with walker. Verbal cues for hand placement.   Ambulation/Gait Ambulation/Gait assistance: +2 physical assistance;Min assist Gait Distance (Feet): 2 Feet Assistive device: Rolling walker (2 wheeled) Gait Pattern/deviations: Step-to pattern;Decreased step length - right;Decreased step length - left;Shuffle;Narrow base of support Gait velocity: decr Gait velocity interpretation: <1.31  ft/sec, indicative of household ambulator General Gait Details: Assist for balance and support. Verbal cues for reassurance and to decr anxiety.    Stairs             Wheelchair Mobility    Modified Rankin (Stroke Patients Only)       Balance Overall balance assessment: Needs assistance Sitting-balance support: Bilateral upper extremity supported;Feet supported Sitting balance-Leahy Scale: Poor Sitting balance - Comments: UE support   Standing balance support: Bilateral upper extremity supported Standing balance-Leahy Scale: Poor Standing balance comment: Stood x 3 with [place] including a 90 sec stand for pericare. Min assist for static standing. Stood x 2 with walker.                            Cognition Arousal/Alertness: Awake/alert Behavior During Therapy: Anxious;Flat affect Overall Cognitive Status: Impaired/Different from baseline Area of Impairment: Attention;Awareness;Safety/judgement;Problem solving                   Current Attention Level: Selective Memory: Decreased short-term memory   Safety/Judgement: Decreased awareness of safety Awareness: Emergent Problem Solving: Slow processing;Decreased initiation        Exercises      General Comments General comments (skin integrity, edema, etc.): Pt on 3L o2 at rest with SpO2  94%. With activity decr to 86%. Incr O2 to 4L with SpO2 back to 90%.       Pertinent Vitals/Pain Pain Assessment: No/denies pain    Home Living                 Additional Comments: Wife taking fmla    Prior Function  PT Goals (current goals can now be found in the care plan section) Acute Rehab PT Goals Patient Stated Goal: to get better Progress towards PT goals: Progressing toward goals    Frequency    Min 3X/week      PT Plan Current plan remains appropriate    Co-evaluation              AM-PAC PT "6 Clicks" Mobility   Outcome Measure  Help needed turning from  your back to your side while in a flat bed without using bedrails?: A Little Help needed moving from lying on your back to sitting on the side of a flat bed without using bedrails?: A Lot Help needed moving to and from a bed to a chair (including a wheelchair)?: Total Help needed standing up from a chair using your arms (e.g., wheelchair or bedside chair)?: A Lot Help needed to walk in hospital room?: A Lot Help needed climbing 3-5 steps with a railing? : Total 6 Click Score: 11    End of Session Equipment Utilized During Treatment: Oxygen Activity Tolerance: Patient tolerated treatment well Patient left: with call bell/phone within reach;with family/visitor present;in chair Nurse Communication: Mobility status;Need for lift equipment PT Visit Diagnosis: Other abnormalities of gait and mobility (R26.89);Muscle weakness (generalized) (M62.81);Difficulty in walking, not elsewhere classified (R26.2)     Time: 7412-8786 PT Time Calculation (min) (ACUTE ONLY): 42 min  Charges:  $Therapeutic Activity: 38-52 mins                     [place] Pager [phone] Office [place] 09/20/2020, 12:20 PM

## 2020-09-20 NOTE — Progress Notes (Signed)
Inpatient Rehabilitation Admissions Coordinator  Inpatient rehab consult received. I met with patient and his spouse at bedside. We discussed goals and expectations of a possible CIR admit. They prefer Cir. I will begin insurance authorization with [employer] for a possible CIR admit.  Barbara Boyette, RN, MSN Rehab Admissions Coordinator [phone] 09/20/2020 11:03 AM  

## 2020-09-21 LAB — GLUCOSE, CAPILLARY
Glucose-Capillary: 138 mg/dL — ABNORMAL HIGH (ref 70–99)
Glucose-Capillary: 159 mg/dL — ABNORMAL HIGH (ref 70–99)
Glucose-Capillary: 307 mg/dL — ABNORMAL HIGH (ref 70–99)
Glucose-Capillary: 175 mg/dL — ABNORMAL HIGH (ref 70–99)

## 2020-09-21 LAB — TROPONIN I (HIGH SENSITIVITY)
Troponin I (High Sensitivity): 11 ng/L (ref <18)
Troponin I (High Sensitivity): 11 ng/L (ref <18)

## 2020-09-21 NOTE — Progress Notes (Signed)
Physical Therapy Treatment Patient Details Name: Tim Day MRN: [id number] DOB: [birth year] Today's Date: 09/21/2020    History of Present Illness 69 year old with hx of recent COVID pneumonia diagnosis (9/7-9/13, discharge on 2L O2), BL COVID induced DVTs, DM2, GERD HTN presenting with worsening SOB. Per chart found to have left pneumothorax and worsening bilateral infiltrates. Intubated 9/19-9/20, Chest tube removed 9/28.     PT Comments    Pt supine in bed.  He was able to progress to standing and perform x2 short bouts of gait training this session.  Pt continues to require mutlimodal cueing and increased time to recover breathing after bouts of activity.  He reamins motivated and would benefit from aggressive rehab in a post acute setting to maximize functional gains and decrease caregiver burden.  Plan next session to progress gt training.      Follow Up Recommendations  CIR     Equipment Recommendations  Rolling walker with 5" wheels;Wheelchair (measurements PT);3in1 (PT);Wheelchair cushion (measurements PT);Other (comment)    Recommendations for Other Services       Precautions / Restrictions Precautions Precautions: Fall Restrictions Weight Bearing Restrictions: No    Mobility  Bed Mobility Overal bed mobility: Needs Assistance Bed Mobility: Rolling;Sidelying to Sit Rolling: Min assist Sidelying to sit: Mod assist;HOB elevated       General bed mobility comments: Assistance to advance LEs and elevate trunk into a seated position.  Transfers Overall transfer level: Needs assistance Equipment used: Rolling walker (2 wheeled) Transfers: Sit to/from Stand Sit to Stand: Mod assist;+2 physical assistance         General transfer comment: Cues for hand placement to push from seated surface.  Assistance to rise into standing.  Ambulation/Gait Ambulation/Gait assistance: +2 physical assistance;Mod assist Gait Distance (Feet): 6 Feet (x2  trials.) Assistive device: Rolling walker (2 wheeled) Gait Pattern/deviations: Step-to pattern;Decreased step length - right;Decreased step length - left;Shuffle;Narrow base of support Gait velocity: decr   General Gait Details: Cues for sequencing and safety this session.  Pt only able to perform short bouts of gt training. His sats maintained during gt but as soon as he sats it would dip to low 80s.  He did however quickly recover.   Stairs             Wheelchair Mobility    Modified Rankin (Stroke Patients Only)       Balance Overall balance assessment: Needs assistance Sitting-balance support: Bilateral upper extremity supported;Feet supported Sitting balance-Leahy Scale: Poor Sitting balance - Comments: UE support Postural control: Right lateral lean Standing balance support: Bilateral upper extremity supported Standing balance-Leahy Scale: Poor                              Cognition Arousal/Alertness: Awake/alert Behavior During Therapy: Anxious;Flat affect Overall Cognitive Status: Impaired/Different from baseline Area of Impairment: Safety/judgement;Problem solving                         Safety/Judgement: Decreased awareness of safety   Problem Solving: Slow processing;Decreased initiation        Exercises      General Comments        Pertinent Vitals/Pain Pain Assessment: Faces Faces Pain Scale: Hurts a little bit Pain Location: generalized Pain Descriptors / Indicators: Discomfort;Aching Pain Intervention(s): Monitored during session;Repositioned    Home Living  Prior Function            PT Goals (current goals can now be found in the care plan section) Acute Rehab PT Goals Patient Stated Goal: to get better Progress towards PT goals: Progressing toward goals    Frequency    Min 3X/week      PT Plan Current plan remains appropriate    Co-evaluation PT/OT/SLP  Co-Evaluation/Treatment: Yes   PT goals addressed during session: Mobility/safety with mobility OT goals addressed during session: ADL's and self-care      AM-PAC PT "6 Clicks" Mobility   Outcome Measure  Help needed turning from your back to your side while in a flat bed without using bedrails?: A Little Help needed moving from lying on your back to sitting on the side of a flat bed without using bedrails?: A Lot Help needed moving to and from a bed to a chair (including a wheelchair)?: A Lot Help needed standing up from a chair using your arms (e.g., wheelchair or bedside chair)?: A Lot Help needed to walk in hospital room?: A Lot Help needed climbing 3-5 steps with a railing? : Total 6 Click Score: 12    End of Session Equipment Utilized During Treatment: Gait belt;Oxygen Activity Tolerance: Patient tolerated treatment well Patient left: with call bell/phone within reach;with family/visitor present;in chair Nurse Communication: Mobility status PT Visit Diagnosis: Other abnormalities of gait and mobility (R26.89);Muscle weakness (generalized) (M62.81);Difficulty in walking, not elsewhere classified (R26.2)     Time: 7062-3762 PT Time Calculation (min) (ACUTE ONLY): 53 min  Charges:  $Gait Training: 8-22 mins $Therapeutic Activity: 8-22 mins                     Erasmo Leventhal , PTA Acute Rehabilitation Services Pager [phone] Office [phone]     Nadir Vasques [name] 09/21/2020, 1:21 PM

## 2020-09-21 NOTE — Progress Notes (Signed)
PROGRESS NOTE    Tim Day  WVP:[id number] DOB: 03/13/1951 DOA: 08/19/2020 PCP: Celene Squibb, MD     Brief Narrative:  Tim Day is a 69 year old with recent Covid pneumonia (September 7th through 54OE), complicated by bilateral DVTs, was admitted 08/19/2020 with left pneumothorax requiring chest tube placement and subsequent intubation 08/20/2020. He was extubated the following day but has persistent bilateral infiltrates is presently requiring ongoing high flow nasal cannula 10 to 15 L oxygen. Hospital course is complicated by urinary retention presently on Foley catheter. He also had an episode of sudden loss of vision in his right eye which has resolved. Neurology work-up is negative.   New events last 24 hours / Subjective: Currently doing well on 3L [place] O2. He states that he was able to get to a recliner and walk 3 steps with a walker. He has significant atrophy of leg muscles from his acute illness. 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 He had been very active on his feet prior to his illness this fall.   Assessment & Plan:   Active Problems:   DM2 (diabetes mellitus, type 2) ([place])   Benign essential HTN   Dyslipidemia   Chest tube in place   COVID-19 virus infection   Acute hypoxemic respiratory failure due to COVID-19 [facility])   Pneumothorax, left   Pneumonia due to COVID-19 virus   Leg DVT (deep venous thromboembolism), acute, bilateral (HCC)   Pressure injury of skin   Acute respiratory failure with hypoxia ([place])   HCAP (healthcare-associated pneumonia)   Primary spontaneous pneumothorax   Protein-calorie malnutrition, severe   Adjustment disorder with anxious mood   Acute hypoxic and hypercarbic respiratory failure  -Secondary to recent COVID-19 pneumonia (Sept 2021) s/p left pneumothorax -Weaned down to 3 L nasal cannula O2, continue to wean as tolerated  -BiPAP nightly due  to hypercarbia overnight -Wean Solu-Medrol to prednisone  Acute urinary retention -Continue Foley catheter, patient has failed voiding trials x2 -Continue tamsulosin  Bilateral DVT -Continue Eliquis  Diabetes mellitus type 2 -Continue Lantus, sliding scale insulin, Tradjenta  Depression/anxiety -Continue Ativan as needed  Sudden unilateral vision loss -MRI brain neg -Resolved spontaneously   In agreement with assessment of the pressure ulcer as below:  Pressure Injury 08/19/20 Buttocks Bilateral Stage 2 -  Partial thickness loss of dermis presenting as a shallow open injury with a red, pink wound bed without slough. (Active)  08/19/20 1853  Location: Buttocks  Location Orientation: Bilateral  Staging: Stage 2 -  Partial thickness loss of dermis presenting as a shallow open injury with a red, pink wound bed without slough.  Wound Description (Comments):   Present on Admission: Yes     Nutrition Problem: Severe Malnutrition Etiology: acute illness (COVID19)   DVT prophylaxis:  SCDs Start: 08/19/20 1842 apixaban (ELIQUIS) tablet 5 mg  Code Status: Full code Family Communication: Family member at bedside Disposition Plan:  Status is: Inpatient  Remains inpatient appropriate because:Unsafe d/c plan   Dispo: The patient is from: Home              Anticipated d/c is to: CIR              Anticipated d/c date is: 1 day              Patient currently is medically stable to d/c.  Awaiting insurance authorization for CIR admission. Called and  left information to complete peer-to-peer. Awaiting a call back from them today.     Antimicrobials:  Anti-infectives (From admission, onward)   Start     Dose/Rate Route Frequency Ordered Stop   09/17/20 1730  piperacillin-tazobactam (ZOSYN) IVPB 3.375 g  Status:  Discontinued        3.375 g 12.5 mL/hr over 240 Minutes Intravenous Every 8 hours 09/17/20 1635 09/17/20 1928   09/03/20 2100  cefTRIAXone (ROCEPHIN) 2 g in sodium  chloride 0.9 % 100 mL IVPB  Status:  Discontinued        2 g 200 mL/hr over 30 Minutes Intravenous Every 24 hours 09/03/20 1802 09/04/20 1443   08/31/20 2230  sulfamethoxazole-trimethoprim (BACTRIM DS) 800-160 MG per tablet 1 tablet  Status:  Discontinued        1 tablet Oral Every 12 hours 08/31/20 2201 09/06/20 1548   08/19/20 2000  piperacillin-tazobactam (ZOSYN) IVPB 3.375 g        3.375 g 12.5 mL/hr over 240 Minutes Intravenous Every 8 hours 08/19/20 1850 08/23/20 2359   08/19/20 1845  piperacillin-tazobactam (ZOSYN) IVPB 3.375 g  Status:  Discontinued        3.375 g 100 mL/hr over 30 Minutes Intravenous  Once 08/19/20 1838 08/19/20 1850   08/19/20 0900  vancomycin (VANCOREADY) IVPB 1500 mg/300 mL        1,500 mg 150 mL/hr over 120 Minutes Intravenous  Once 08/19/20 0831 08/19/20 1303   08/19/20 0845  piperacillin-tazobactam (ZOSYN) IVPB 3.375 g        3.375 g 100 mL/hr over 30 Minutes Intravenous  Once 08/19/20 0831 08/19/20 1103       Objective: Vitals:   09/21/20 0513 09/21/20 0517 09/21/20 0750 09/21/20 0901  BP: 127/78  110/69   Pulse: 91  99   Resp: (!) 27  (!) 22   Temp: 98.9 F (37.2 C)  (!) 97.5 F (36.4 C)   TempSrc: Oral  Oral   SpO2: 99%  99% 97%  Weight:  82.1 kg    Height:        Intake/Output Summary (Last 24 hours) at 09/21/2020 1105 Last data filed at 09/21/2020 0500 Gross per 24 hour  Intake 477 ml  Output 1150 ml  Net -673 ml   Filed Weights   09/18/20 0345 09/20/20 0700 09/21/20 0517  Weight: 83.9 kg 82.1 kg 82.1 kg    Examination: General exam: Appears calm and comfortable  Respiratory system: Clear to auscultation. Respiratory effort normal. Cardiovascular system: S1 & S2 heard, RRR. No pedal edema. Gastrointestinal system: Abdomen is nondistended, soft and nontender. Normal bowel sounds heard. Central nervous system: Alert and oriented. Non focal exam. Speech clear  Extremities: Symmetric in appearance bilaterally, atrophy of his leg  muscles  Skin: No rashes, lesions or ulcers on exposed skin  Psychiatry: Judgement and insight appear stable. Mood & affect appropriate.    Data Reviewed: I have personally reviewed following labs and imaging studies  CBC: Recent Labs  Lab 09/17/20 0203 09/19/20 0629 09/19/20 0758 09/20/20 0334  WBC 7.3 6.6 6.8 7.2  NEUTROABS  --  4.6 4.8 5.3  HGB 10.4* 11.7* 11.2* 11.2*  HCT 34.1* 38.1* 36.5* 37.2*  MCV 93.7 95.5 95.8 94.7  PLT 242 242 255 599   Basic Metabolic Panel: Recent Labs  Lab 09/15/20 0232 09/15/20 0232 09/16/20 0213 09/17/20 0203 09/19/20 0629 09/19/20 0758 09/19/20 1617 09/20/20 0334 09/20/20 0747  NA 132*  --  133* 132* 133*  --   --   --  135  K 4.5   < > 4.6 4.7 5.2*  --  4.9  --  5.0  CL 91*  --  92* 92* 91*  --   --   --  92*  CO2 32  --  32 34* 34*  --   --   --  34*  GLUCOSE 114*  --  88 168* 262*  --   --   --  224*  BUN 15  --  14 15 21   --   --   --  20  CREATININE 0.53*  --  0.57* 0.50* 0.59*  --   --   --  0.57*  CALCIUM 7.9*  --  7.9* 8.0* 8.3*  --   --   --  8.6*  MG  --   --   --   --  2.2 2.2  --  2.1  --   PHOS  --   --   --   --  4.7* 4.3  --  3.7  --    < > = values in this interval not displayed.   GFR: Estimated Creatinine Clearance: 92.5 mL/min (A) (by C-G formula based on SCr of 0.57 mg/dL (L)). Liver Function Tests: Recent Labs  Lab 09/15/20 0232 09/16/20 0213 09/17/20 0203 09/19/20 0629  AST 33 37 26 33  ALT 67* 72* 63* 74*  ALKPHOS 53 54 54 60  BILITOT 0.9 0.5 0.4 0.5  PROT 5.5* 5.6* 5.3* 6.0*  ALBUMIN 2.2* 2.2* 2.2* 2.6*   No results for input(s): LIPASE, AMYLASE in the last 168 hours. No results for input(s): AMMONIA in the last 168 hours. Coagulation Profile: No results for input(s): INR, PROTIME in the last 168 hours. Cardiac Enzymes: Recent Labs  Lab 09/18/20 0341  CKTOTAL 30*   BNP (last 3 results) No results for input(s): PROBNP in the last 8760 hours. HbA1C: No results for input(s): HGBA1C in the  last 72 hours. CBG: Recent Labs  Lab 09/20/20 0732 09/20/20 1148 09/20/20 1806 09/20/20 2119 09/21/20 0742  GLUCAP 201* 223* 194* 152* 138*   Lipid Profile: No results for input(s): CHOL, HDL, LDLCALC, TRIG, CHOLHDL, LDLDIRECT in the last 72 hours. Thyroid Function Tests: No results for input(s): TSH, T4TOTAL, FREET4, T3FREE, THYROIDAB in the last 72 hours. Anemia Panel: Recent Labs    09/19/20 0629 09/20/20 0334  FERRITIN 203 152   Sepsis Labs: Recent Labs  Lab 09/17/20 1631 09/18/20 0341  PROCALCITON <0.10 <0.10    No results found for this or any previous visit (from the past 240 hour(s)).    Radiology Studies: No results found.    Scheduled Meds: . albuterol  2 puff Inhalation BID  . apixaban  5 mg Oral BID  . chlorhexidine  15 mL Mouth Rinse BID  . Chlorhexidine Gluconate Cloth  6 each Topical Daily  . collagenase   Topical BID  . docusate sodium  100 mg Oral BID  . feeding supplement  237 mL Oral TID BM  . hydrocortisone cream   Topical BID  . hydroxypropyl methylcellulose / hypromellose  2 drop Both Eyes QID  . insulin aspart  0-15 Units Subcutaneous TID WC  . insulin aspart  0-5 Units Subcutaneous QHS  . insulin aspart  15 Units Subcutaneous TID WC  . insulin glargine  32 Units Subcutaneous BID  . linagliptin  5 mg Oral Daily  . mouth rinse  15 mL Mouth Rinse q12n4p  . melatonin  3 mg Oral QHS  .  pantoprazole  40 mg Oral Daily  . predniSONE  40 mg Oral Q breakfast  . tamsulosin  0.4 mg Oral Daily  . tobramycin  1 drop Both Eyes Q12H   Continuous Infusions: . sodium chloride       LOS: 33 days      Time spent: 35 minutes   Dessa Phi, DO [facility] 09/21/2020, 11:05 AM   Available via Epic secure chat 7am-7pm After these hours, please refer to coverage provider listed on [email or web address]

## 2020-09-21 NOTE — Progress Notes (Signed)
Received a call from central tele reporting a recording of ST elevation at 1614. An EKG was obtained that showed ST elevation in V2. MD was notified. Troponins ordered for lab and was told no medical interventions to be completed at this time.

## 2020-09-21 NOTE — Progress Notes (Addendum)
Inpatient Rehabilitation Admissions Coordinator  I have received an initial denial from [facility] for Cir admit. I have notified Dr. Maylene Roes to request a peer to peer with [facility] MD. I met with patient and his wife at bedside and they are aware. They wish for me to pursue expedited appeal after peer to peer today which I will. I have updated acute team and TOC.  Danne Baxter, RN, MSN Rehab Admissions Coordinator [phone] 09/21/2020 10:49 AM   Notified by Dr. Maylene Roes that peer to peer denial. I will begin expedited appeal.  Danne Baxter, RN, MSN Rehab Admissions Coordinator [phone] 09/21/2020 11:36 AM

## 2020-09-21 NOTE — Progress Notes (Signed)
  Subjective: Pain controlled. No nausea or emesis. Tolerating foley. Mobilizing some with PT.  Objective: Vital signs in last 24 hours: Temp:  [97.5 F (36.4 C)-98.9 F (37.2 C)] 98.7 F (37.1 C) (10/21 1650) Pulse Rate:  [91-103] 97 (10/21 1650) Resp:  [22-35] 35 (10/21 1650) BP: (110-136)/(63-89) 129/81 (10/21 1650) SpO2:  [96 %-100 %] 99 % (10/21 1622) FiO2 (%):  [40 %] 40 % (10/21 0022) Weight:  [82.1 kg] 82.1 kg (10/21 0517)  Intake/Output from previous day: 10/20 0701 - 10/21 0700 In: 627 [P.O.:627] Out: 2750 [Urine:2750] Intake/Output this shift: Total I/O In: -  Out: 1400 [Urine:1400]  Physical Exam:  General: Alert and oriented CV: RRR Lungs: Clear Abdomen: Soft, ND, NT Ext: NT, No erythema Foley draining clear yellow urine  Lab Results: Recent Labs    09/19/20 0629 09/19/20 0758 09/20/20 0334  HGB 11.7* 11.2* 11.2*  HCT 38.1* 36.5* 37.2*   BMET Recent Labs    09/19/20 0629 09/19/20 0629 09/19/20 1617 09/20/20 0747  NA 133*  --   --  135  K 5.2*   < > 4.9 5.0  CL 91*  --   --  92*  CO2 34*  --   --  34*  GLUCOSE 262*  --   --  224*  BUN 21  --   --  20  CREATININE 0.59*  --   --  0.57*  CALCIUM 8.3*  --   --  8.6*   < > = values in this interval not displayed.     Studies/Results: No results found.  Assessment/Plan: 1. Acute urinary retention/incomplete bladder emptying:14 French Foley catheter placed per hypospadiac urethra with return of 500 mL urine on 9/30. Removed and replaced on 10/6. Continue Flomax. Leave foley in place until more mobile. Will arrange for VT in office. 2. Acute cystitis: UCx 9/30 with enterobacter aerogenes. Received course of abx per primary.  -Leave foley catheter in place until more mobile. Can try void trial in house if more ambulatory. But likely plan for void trial in office.    LOS: 33 days   Tim Day 09/21/2020, 6:06 PM [facility]  Pager: [phone]

## 2020-09-21 NOTE — Progress Notes (Signed)
Occupational Therapy Treatment Patient Details Name: Tim Day MRN: [id number] DOB: 17-Jan-1951 Today's Date: 09/21/2020    History of present illness 69 year old with hx of recent COVID pneumonia diagnosis (9/7-9/13, discharge on 2L O2), BL COVID induced DVTs, DM2, GERD HTN presenting with worsening SOB. Per chart found to have left pneumothorax and worsening bilateral infiltrates. Intubated 9/19-9/20, Chest tube removed 9/28.    OT comments  Pt seen for OT follow up with focus on ADL mobility progression. Pt was able to complete bed mobility with min A and sit <> stands with mod A +2 and RW. Pt able to progress with using RW instead of steady. Pt was able to take steps from bed to sink with RW and mod A +2, then requiring seated rest break with cues for pursed lip breathing and cues for relaxation. Pt then took additional steps with RW from sink to door with mod A +2. Marland Kitchen Pt incontinent of bowels in transition and requiring total A for peri care. Overall, pt able to complete x4 sit <> stands this session. Pt required 3-4 mins of recovery between bouts of mobility. Pt on 3L [place] throughout mobility. After second bout of mobility, O2 sats down to high 70s with elevated HR. Bumped pt to 4L [place] for 3-5 min recovery period then placed back on 3L [place]. D/c recs remain appropriate for CIR. Will continue to follow.   Follow Up Recommendations  CIR;Supervision/Assistance - 24 hour    Equipment Recommendations  3 in 1 bedside commode;Tub/shower seat;Wheelchair cushion (measurements OT);Wheelchair (measurements OT);Hospital bed    Recommendations for Other Services      Precautions / Restrictions Precautions Precautions: Fall Restrictions Weight Bearing Restrictions: No       Mobility Bed Mobility Overal bed mobility: Needs Assistance Bed Mobility: Rolling;Sidelying to Sit Rolling: Min assist Sidelying to sit: Mod assist;HOB elevated       General bed mobility comments:  Assistance to advance LEs and elevate trunk into a seated position.  Transfers Overall transfer level: Needs assistance Equipment used: Rolling walker (2 wheeled) Transfers: Sit to/from Stand Sit to Stand: Mod assist;+2 physical assistance         General transfer comment: Cues for hand placement to push from seated surface.  Assistance to rise into standing with RW    Balance Overall balance assessment: Needs assistance Sitting-balance support: Bilateral upper extremity supported;Feet supported Sitting balance-Leahy Scale: Poor Sitting balance - Comments: UE support. Pt donned socks with max A sitting EOB and requiring intermittent min truncal support to maintain upright sitting balance   Standing balance support: Bilateral upper extremity supported;During functional activity Standing balance-Leahy Scale: Poor Standing balance comment: reliant on external support and assist from OT/PT                           ADL either performed or assessed with clinical judgement   ADL Overall ADL's : Needs assistance/impaired                     Lower Body Dressing: Maximal assistance;Sit to/from stand Lower Body Dressing Details (indicate cue type and reason): while sitting EOB, assisted ptin assuming and maintaining figure 4 position while pt pulled socks up over heels Toilet Transfer: Moderate assistance;+2 for physical assistance;+2 for safety/equipment Toilet Transfer Details (indicate cue type and reason): pt able to progress with use of RW this date Toileting- Water quality scientist and Hygiene: Total assistance Toileting - Clothing Manipulation Details (  indicate cue type and reason): incontinent of bowels this session     Functional mobility during ADLs: Moderate assistance;+2 for physical assistance;Cueing for safety;Cueing for sequencing;Rolling walker General ADL Comments: Pt able to progress to using RW this date for small bouts of mobility. Continues to  require increased rest breaks, cues for energy conservation. Pt has implemented strategies from previous OT session by doing more and more for himself.     Vision       Perception     Praxis      Cognition Arousal/Alertness: Awake/alert Behavior During Therapy: Anxious;Flat affect Overall Cognitive Status: Impaired/Different from baseline Area of Impairment: Safety/judgement;Problem solving                         Safety/Judgement: Decreased awareness of safety   Problem Solving: Slow processing;Decreased initiation General Comments: Requires increased time and cues to problem solve basic mobility tasks in session. Easily anxious with increased mobility with increased WOB. Responds well to cues and reinforcement from wife        Exercises     Shoulder Instructions       General Comments      Pertinent Vitals/ Pain       Pain Assessment: Faces Faces Pain Scale: Hurts a little bit Pain Location: generalized Pain Descriptors / Indicators: Discomfort;Aching Pain Intervention(s): Monitored during session;Repositioned  Home Living                                          Prior Functioning/Environment              Frequency  Min 2X/week        Progress Toward Goals  OT Goals(current goals can now be found in the care plan section)  Progress towards OT goals: Progressing toward goals  Acute Rehab OT Goals Patient Stated Goal: to get better OT Goal Formulation: With patient/family Time For Goal Achievement: 09/26/20 Potential to Achieve Goals: Good  Plan Discharge plan remains appropriate    Co-evaluation          OT goals addressed during session: ADL's and self-care;Proper use of Adaptive equipment and DME;Strengthening/ROM      AM-PAC OT "6 Clicks" Daily Activity     Outcome Measure   Help from another person eating meals?: A Little Help from another person taking care of personal grooming?: A Little Help from  another person toileting, which includes using toliet, bedpan, or urinal?: A Lot Help from another person bathing (including washing, rinsing, drying)?: A Lot Help from another person to put on and taking off regular upper body clothing?: A Lot Help from another person to put on and taking off regular lower body clothing?: A Lot 6 Click Score: 14    End of Session Equipment Utilized During Treatment: Oxygen  OT Visit Diagnosis: Unsteadiness on feet (R26.81);Other abnormalities of gait and mobility (R26.89);Muscle weakness (generalized) (M62.81);Other symptoms and signs involving cognitive function   Activity Tolerance Patient tolerated treatment well   Patient Left in chair;with call bell/phone within reach;with family/visitor present   Nurse Communication Mobility status        Time: 3810-1751 OT Time Calculation (min): 45 min  Charges: OT General Charges $OT Visit: 1 Visit OT Treatments $Self Care/Home Management : 8-22 mins  Tim Day, MSOT, OTR/L [facility] - Closed Office Number: [phone] Pager: [phone]   Tim Day  Tim Day 09/21/2020, 6:17 PM
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
## 2020-09-22 LAB — GLUCOSE, CAPILLARY
Glucose-Capillary: 234 mg/dL — ABNORMAL HIGH (ref 70–99)
Glucose-Capillary: 135 mg/dL — ABNORMAL HIGH (ref 70–99)
Glucose-Capillary: 235 mg/dL — ABNORMAL HIGH (ref 70–99)
Glucose-Capillary: 207 mg/dL — ABNORMAL HIGH (ref 70–99)

## 2020-09-22 MED ORDER — ALPRAZOLAM 0.25 MG PO TABS
0.2500 mg | ORAL_TABLET | Freq: Three times a day (TID) | ORAL | Status: DC | PRN
  Administered 2020-09-22 – 2020-09-28 (×7): 0.25 mg via ORAL
  Filled 2020-09-22 (×7): qty 1

## 2020-09-22 MED ORDER — INSULIN GLARGINE 100 UNIT/ML ~~LOC~~ SOLN
35.0000 [IU] | Freq: Two times a day (BID) | SUBCUTANEOUS | Status: DC
  Administered 2020-09-22 – 2020-09-23 (×3): 35 [IU] via SUBCUTANEOUS
  Filled 2020-09-22 (×4): qty 0.35

## 2020-09-22 NOTE — Progress Notes (Signed)
Received a page from nurse that patient was noted to have red eye on the left side.  This happened right after he worked with the physical therapy.  Patient evaluated the bedside.  Wife at the bedside as well.  Patient denied having any pain or any problem with the vision in the left or the right eye.  On examination, he had a large subconjunctival hemorrhage.  No intervention indicated.  Subconjunctival hemorrhage are usually benign and self-limiting.  Reassured patient, family and informed RN as well.

## 2020-09-22 NOTE — Progress Notes (Signed)
Inpatient Rehabilitation Admissions Coordinator  I have not heard decision on insurance appeal for [facility] admit. I met with patient and his wife at bedside and they are aware.  Danne Baxter, RN, MSN Rehab Admissions Coordinator [phone] 09/22/2020 12:06 PM

## 2020-09-22 NOTE — Progress Notes (Signed)
PROGRESS NOTE    Tim Day  WEX:[id number] DOB: Sep 29, 1951 DOA: 08/19/2020 PCP: Celene Squibb, MD     Brief Narrative:  Tim Day is a 69 year old with recent Covid pneumonia (September 7th through 93YB), complicated by bilateral DVTs, was admitted 08/19/2020 with left pneumothorax requiring chest tube placement and subsequent intubation 08/20/2020. He was extubated the following day but has persistent bilateral infiltrates is presently requiring ongoing high flow nasal cannula 10 to 15 L oxygen. Hospital course is complicated by urinary retention presently on Foley catheter. He also had an episode of sudden loss of vision in his right eye which has resolved. Neurology work-up is negative.   New events last 24 hours / Subjective: Seen and examined.  Wife at the bedside.  He has no complaints.  Assessment & Plan:   Active Problems:   DM2 (diabetes mellitus, type 2) ([place])   Benign essential HTN   Dyslipidemia   Chest tube in place   COVID-19 virus infection   Acute hypoxemic respiratory failure due to COVID-19 [facility])   Pneumothorax, left   Pneumonia due to COVID-19 virus   Leg DVT (deep venous thromboembolism), acute, bilateral (HCC)   Pressure injury of skin   Acute respiratory failure with hypoxia ([place])   HCAP (healthcare-associated pneumonia)   Primary spontaneous pneumothorax   Protein-calorie malnutrition, severe   Adjustment disorder with anxious mood   Acute hypoxic and hypercarbic respiratory failure  -Secondary to recent COVID-19 pneumonia (Sept 2021) s/p left pneumothorax -Weaned down to 3 L nasal cannula O2, continue to wean as tolerated  -BiPAP nightly due to hypercarbia overnight -Wean Solu-Medrol to prednisone  Acute urinary retention -Continue Foley catheter, patient has failed voiding trials x2 -Continue tamsulosin.  Continue Foley catheter and try voiding trial once he is more mobile.  If not, urology will see him as outpatient and  try VT.  Bilateral DVT -Continue Eliquis  Diabetes mellitus type 2 -Blood sugar slightly elevated, increase Lantus from 32 to 35 units twice daily and continue sliding scale insulin, Tradjenta  Depression/anxiety -Continue Ativan as needed  Sudden unilateral vision loss -MRI brain neg -Resolved spontaneously  In agreement with assessment of the pressure ulcer as below:  Pressure Injury 08/19/20 Buttocks Bilateral Stage 2 -  Partial thickness loss of dermis presenting as a shallow open injury with a red, pink wound bed without slough. (Active)  08/19/20 1853  Location: Buttocks  Location Orientation: Bilateral  Staging: Stage 2 -  Partial thickness loss of dermis presenting as a shallow open injury with a red, pink wound bed without slough.  Wound Description (Comments):   Present on Admission: Yes     Nutrition Problem: Severe Malnutrition Etiology: acute illness (COVID19)   DVT prophylaxis:  SCDs Start: 08/19/20 1842 apixaban (ELIQUIS) tablet 5 mg  Code Status: Full code Family Communication: Wife at the bedside. Disposition Plan:  Status is: Inpatient  Remains inpatient appropriate because:Unsafe d/c plan   Dispo: The patient is from: Home              Anticipated d/c is to: CIR              Anticipated d/c date is: 1 day              Patient currently is medically stable to d/c.  Awaiting insurance authorization for CIR admission.  Case manager working on appeal.    Antimicrobials:  Anti-infectives (From admission, onward)   Start     Dose/Rate Route Frequency  Ordered Stop   09/17/20 1730  piperacillin-tazobactam (ZOSYN) IVPB 3.375 g  Status:  Discontinued        3.375 g 12.5 mL/hr over 240 Minutes Intravenous Every 8 hours 09/17/20 1635 09/17/20 1928   09/03/20 2100  cefTRIAXone (ROCEPHIN) 2 g in sodium chloride 0.9 % 100 mL IVPB  Status:  Discontinued        2 g 200 mL/hr over 30 Minutes Intravenous Every 24 hours 09/03/20 1802 09/04/20 1443   08/31/20  2230  sulfamethoxazole-trimethoprim (BACTRIM DS) 800-160 MG per tablet 1 tablet  Status:  Discontinued        1 tablet Oral Every 12 hours 08/31/20 2201 09/06/20 1548   08/19/20 2000  piperacillin-tazobactam (ZOSYN) IVPB 3.375 g        3.375 g 12.5 mL/hr over 240 Minutes Intravenous Every 8 hours 08/19/20 1850 08/23/20 2359   08/19/20 1845  piperacillin-tazobactam (ZOSYN) IVPB 3.375 g  Status:  Discontinued        3.375 g 100 mL/hr over 30 Minutes Intravenous  Once 08/19/20 1838 08/19/20 1850   08/19/20 0900  vancomycin (VANCOREADY) IVPB 1500 mg/300 mL        1,500 mg 150 mL/hr over 120 Minutes Intravenous  Once 08/19/20 0831 08/19/20 1303   08/19/20 0845  piperacillin-tazobactam (ZOSYN) IVPB 3.375 g        3.375 g 100 mL/hr over 30 Minutes Intravenous  Once 08/19/20 0831 08/19/20 1103       Objective: Vitals:   09/22/20 0539 09/22/20 0754 09/22/20 0820 09/22/20 1158  BP: 122/74  131/77 123/79  Pulse: 87 96 97 91  Resp: (!) 28 (!) 24 (!) 36 14  Temp: 98.7 F (37.1 C)  98.6 F (37 C) 98.8 F (37.1 C)  TempSrc: Oral  Oral Oral  SpO2: 99% 95% 96% 99%  Weight: 84.8 kg     Height:        Intake/Output Summary (Last 24 hours) at 09/22/2020 1324 Last data filed at 09/22/2020 1155 Gross per 24 hour  Intake 600 ml  Output 6150 ml  Net -5550 ml   Filed Weights   09/20/20 0700 09/21/20 0517 09/22/20 0539  Weight: 82.1 kg 82.1 kg 84.8 kg    Examination:  General exam: Appears calm and comfortable  Respiratory system: Clear to auscultation. Respiratory effort normal. Cardiovascular system: S1 & S2 heard, RRR. No JVD, murmurs, rubs, gallops or clicks. No pedal edema. Gastrointestinal system: Abdomen is nondistended, soft and nontender. No organomegaly or masses felt. Normal bowel sounds heard. Central nervous system: Alert and oriented. No focal neurological deficits. Extremities: Symmetric 5 x 5 power. Skin: No rashes, lesions or ulcers.  Psychiatry: Judgement and insight  appear normal. Mood & affect appropriate.    Data Reviewed: I have personally reviewed following labs and imaging studies  CBC: Recent Labs  Lab 09/17/20 0203 09/19/20 0629 09/19/20 0758 09/20/20 0334  WBC 7.3 6.6 6.8 7.2  NEUTROABS  --  4.6 4.8 5.3  HGB 10.4* 11.7* 11.2* 11.2*  HCT 34.1* 38.1* 36.5* 37.2*  MCV 93.7 95.5 95.8 94.7  PLT 242 242 255 465   Basic Metabolic Panel: Recent Labs  Lab 09/16/20 0213 09/17/20 0203 09/19/20 0629 09/19/20 0758 09/19/20 1617 09/20/20 0334 09/20/20 0747  NA 133* 132* 133*  --   --   --  135  K 4.6 4.7 5.2*  --  4.9  --  5.0  CL 92* 92* 91*  --   --   --  92*  CO2 32 34* 34*  --   --   --  34*  GLUCOSE 88 168* 262*  --   --   --  224*  BUN 14 15 21   --   --   --  20  CREATININE 0.57* 0.50* 0.59*  --   --   --  0.57*  CALCIUM 7.9* 8.0* 8.3*  --   --   --  8.6*  MG  --   --  2.2 2.2  --  2.1  --   PHOS  --   --  4.7* 4.3  --  3.7  --    GFR: Estimated Creatinine Clearance: 92.5 mL/min (A) (by C-G formula based on SCr of 0.57 mg/dL (L)). Liver Function Tests: Recent Labs  Lab 09/16/20 0213 09/17/20 0203 09/19/20 0629  AST 37 26 33  ALT 72* 63* 74*  ALKPHOS 54 54 60  BILITOT 0.5 0.4 0.5  PROT 5.6* 5.3* 6.0*  ALBUMIN 2.2* 2.2* 2.6*   No results for input(s): LIPASE, AMYLASE in the last 168 hours. No results for input(s): AMMONIA in the last 168 hours. Coagulation Profile: No results for input(s): INR, PROTIME in the last 168 hours. Cardiac Enzymes: Recent Labs  Lab 09/18/20 0341  CKTOTAL 30*   BNP (last 3 results) No results for input(s): PROBNP in the last 8760 hours. HbA1C: No results for input(s): HGBA1C in the last 72 hours. CBG: Recent Labs  Lab 09/21/20 1125 09/21/20 1616 09/21/20 2140 09/22/20 0819 09/22/20 1155  GLUCAP 159* 307* 175* 234* 135*   Lipid Profile: No results for input(s): CHOL, HDL, LDLCALC, TRIG, CHOLHDL, LDLDIRECT in the last 72 hours. Thyroid Function Tests: No results for input(s):  TSH, T4TOTAL, FREET4, T3FREE, THYROIDAB in the last 72 hours. Anemia Panel: Recent Labs    09/20/20 0334  FERRITIN 152   Sepsis Labs: Recent Labs  Lab 09/17/20 1631 09/18/20 0341  PROCALCITON <0.10 <0.10    No results found for this or any previous visit (from the past 240 hour(s)).    Radiology Studies: No results found.    Scheduled Meds: . albuterol  2 puff Inhalation BID  . apixaban  5 mg Oral BID  . chlorhexidine  15 mL Mouth Rinse BID  . Chlorhexidine Gluconate Cloth  6 each Topical Daily  . collagenase   Topical BID  . docusate sodium  100 mg Oral BID  . feeding supplement  237 mL Oral TID BM  . hydrocortisone cream   Topical BID  . hydroxypropyl methylcellulose / hypromellose  2 drop Both Eyes QID  . insulin aspart  0-15 Units Subcutaneous TID WC  . insulin aspart  0-5 Units Subcutaneous QHS  . insulin aspart  15 Units Subcutaneous TID WC  . insulin glargine  35 Units Subcutaneous BID  . linagliptin  5 mg Oral Daily  . mouth rinse  15 mL Mouth Rinse q12n4p  . melatonin  3 mg Oral QHS  . pantoprazole  40 mg Oral Daily  . predniSONE  40 mg Oral Q breakfast  . tamsulosin  0.4 mg Oral Daily   Continuous Infusions: . sodium chloride       LOS: 34 days   Time spent: 34 minutes   Darliss Cheney, MD [facility] 09/22/2020, 1:24 PM   Available via Epic secure chat 7am-7pm After these hours, please refer to coverage provider listed on [email or web address]

## 2020-09-22 NOTE — Progress Notes (Signed)
Dr. Loanne Drilling in to see pt. In regard to removal of sutures from chest tube site closure. Ok to remove sutures per Dr. Loanne Drilling. Sutures removed. Pt tolerated well. Site edges well approximated. No drainage noted. Scant amount of drainage at suture removal site. Covered with bandage. Will report to oncoming nurse to monitor site.

## 2020-09-22 NOTE — Progress Notes (Signed)
Physical Therapy Treatment Patient Details Name: Tim Day MRN: [id number] DOB: 01/06/1951 Today's Date: 09/22/2020    History of Present Illness 69 year old with hx of recent COVID pneumonia diagnosis (9/7-9/13, discharge on 2L O2), BL COVID induced DVTs, DM2, GERD HTN presenting with worsening SOB. Per chart found to have left pneumothorax and worsening bilateral infiltrates. Intubated 9/19-9/20, Chest tube removed 9/28.     PT Comments    Pt requiring max encouragement from therapist and wife for OOB activity.  Upon sitting EOB, pt with c/o dizziness - VSS except RR increased, no nystagmus noted.  Suspect dizziness related to time in bed and anxiety symptoms. He was able to ambulate from bed to bsc and then to recliner with mod A of 2 for safety.  Required frequent cues for relaxation, posture, and breathing technique. Of note pt did not have anxiety meds prior to therapy today.    Follow Up Recommendations  CIR     Equipment Recommendations  Rolling walker with 5" wheels;Wheelchair (measurements PT);3in1 (PT);Wheelchair cushion (measurements PT);Other (comment)    Recommendations for Other Services       Precautions / Restrictions Precautions Precautions: Fall Precaution Comments: High RR, watch sats    Mobility  Bed Mobility Overal bed mobility: Needs Assistance Bed Mobility: Rolling;Supine to Sit Rolling: Min assist   Supine to sit: Mod assist;HOB elevated     General bed mobility comments: Assistance to advance LEs and elevate trunk into a seated position.  Transfers Overall transfer level: Needs assistance Equipment used: Rolling walker (2 wheeled) Transfers: Sit to/from Stand Sit to Stand: Mod assist;+2 physical assistance         General transfer comment: Cues for hand placement and assist to rise,.   Performed x 3  Ambulation/Gait Ambulation/Gait assistance: +2 physical assistance;Mod assist Gait Distance (Feet): 3 Feet (3'x2) Assistive  device: Rolling walker (2 wheeled) Gait Pattern/deviations: Step-to pattern;Shuffle;Narrow base of support;Decreased stride length Gait velocity: decr   General Gait Details: Requiring frequent cues for encouragement, controlled breathing, and sequencing.  Required assist with RW and mod A for balance at times.  Cues for complete transfers prior to sitting.  Did not ambulate as far due to fatigue after BM and ADLs   Stairs             Wheelchair Mobility    Modified Rankin (Stroke Patients Only)       Balance Overall balance assessment: Needs assistance Sitting-balance support: Bilateral upper extremity supported;Feet supported Sitting balance-Leahy Scale: Poor Sitting balance - Comments: Required UE but stable at EOB.   Standing balance support: Bilateral upper extremity supported;During functional activity Standing balance-Leahy Scale: Poor Standing balance comment: Required RW and min-mod A                            Cognition Arousal/Alertness: Awake/alert Behavior During Therapy: Anxious Overall Cognitive Status: Impaired/Different from baseline Area of Impairment: Safety/judgement;Problem solving                   Current Attention Level: Selective     Safety/Judgement: Decreased awareness of safety Awareness: Emergent Problem Solving: Slow processing;Difficulty sequencing;Requires tactile cues;Requires verbal cues General Comments: Requires increased time and cues to problem solve basic mobility tasks in session. Easily anxious with increased mobility with increased WOB. Responds well to cues and reinforcement from wife.  Required max encouragement for relaxation, breathing technique, and to push self.      Exercises General  Exercises - Lower Extremity Ankle Circles/Pumps: AAROM;Both;10 reps;Supine Quad Sets: AROM;10 reps    General Comments General comments (skin integrity, edema, etc.): Pt on 3 L O2 at rest with sats 93%.  Sats down to  85% with activity and pt requesting more O2 tried 4 L and sats up to 89-90% but pt still anxious and demanding increased O2.  Increased to 5 L.  Encouraged slow deep breaths in through nose as pt taking rapid breaths in mouths.  Required frequent repetition for breathing cues.  Once relaxed in chair able to decrease back to 3 LPM within 1-2 minutes with stable sats.  Adtionally, pt with c/o dizziness in sitting (has had issues in prior sessions).  BP was stable, no nystagmus noted, once in chair and relaxed dizziness eased.  Possibly due to anxiety and increased time in bed.      Pertinent Vitals/Pain Pain Assessment: No/denies pain    Home Living                      Prior Function            PT Goals (current goals can now be found in the care plan section) Acute Rehab PT Goals Patient Stated Goal: to get better PT Goal Formulation: With patient/family Time For Goal Achievement: 09/27/20 Potential to Achieve Goals: Good Progress towards PT goals: Progressing toward goals    Frequency    Min 3X/week      PT Plan Current plan remains appropriate    Co-evaluation              AM-PAC PT "6 Clicks" Mobility   Outcome Measure  Help needed turning from your back to your side while in a flat bed without using bedrails?: A Little Help needed moving from lying on your back to sitting on the side of a flat bed without using bedrails?: A Lot Help needed moving to and from a bed to a chair (including a wheelchair)?: A Lot Help needed standing up from a chair using your arms (e.g., wheelchair or bedside chair)?: A Lot Help needed to walk in hospital room?: A Lot Help needed climbing 3-5 steps with a railing? : Total 6 Click Score: 12    End of Session Equipment Utilized During Treatment: Gait belt;Oxygen Activity Tolerance: Other (comment) (limited due to anxiety) Patient left: with call bell/phone within reach;with family/visitor present;in chair;with chair alarm  set Nurse Communication: Mobility status PT Visit Diagnosis: Other abnormalities of gait and mobility (R26.89);Muscle weakness (generalized) (M62.81);Difficulty in walking, not elsewhere classified (R26.2)     Time: 2778-2423 PT Time Calculation (min) (ACUTE ONLY): 35 min  Charges:  $Gait Training: 8-22 mins $Therapeutic Activity: 8-22 mins                     Abran Richard, PT Acute Rehab Services Pager [phone] [facility] [place] 09/22/2020, 3:18 PM

## 2020-09-23 LAB — BASIC METABOLIC PANEL
Anion gap: 6 (ref 5–15)
BUN: 23 mg/dL (ref 8–23)
CO2: 37 mmol/L — ABNORMAL HIGH (ref 22–32)
Calcium: 8.7 mg/dL — ABNORMAL LOW (ref 8.9–10.3)
Chloride: 94 mmol/L — ABNORMAL LOW (ref 98–111)
Creatinine, Ser: 0.59 mg/dL — ABNORMAL LOW (ref 0.61–1.24)
GFR, Estimated: >60 mL/min (ref >60)
Glucose, Bld: 137 mg/dL — ABNORMAL HIGH (ref 70–99)
Potassium: 4.8 mmol/L (ref 3.5–5.1)
Sodium: 137 mmol/L (ref 135–145)

## 2020-09-23 LAB — CBC WITH DIFFERENTIAL/PLATELET
Abs Immature Granulocytes: 0.35 10*3/uL — ABNORMAL HIGH (ref 0.00–0.07)
Basophils Absolute: 0.1 10*3/uL (ref 0.0–0.1)
Basophils Relative: 1 %
Eosinophils Absolute: 0.1 10*3/uL (ref 0.0–0.5)
Eosinophils Relative: 1 %
HCT: 39.8 % (ref 39.0–52.0)
Hemoglobin: 11.9 g/dL — ABNORMAL LOW (ref 13.0–17.0)
Immature Granulocytes: 4 %
Lymphocytes Relative: 21 %
Lymphs Abs: 1.8 10*3/uL (ref 0.7–4.0)
MCH: 28.9 pg (ref 26.0–34.0)
MCHC: 29.9 g/dL — ABNORMAL LOW (ref 30.0–36.0)
MCV: 96.6 fL (ref 80.0–100.0)
Monocytes Absolute: 0.7 10*3/uL (ref 0.1–1.0)
Monocytes Relative: 8 %
Neutro Abs: 5.7 10*3/uL (ref 1.7–7.7)
Neutrophils Relative %: 65 %
Platelets: 229 10*3/uL (ref 150–400)
RBC: 4.12 MIL/uL — ABNORMAL LOW (ref 4.22–5.81)
RDW: 17.5 % — ABNORMAL HIGH (ref 11.5–15.5)
WBC: 8.6 10*3/uL (ref 4.0–10.5)
nRBC: 0.3 % — ABNORMAL HIGH (ref 0.0–0.2)

## 2020-09-23 LAB — MAGNESIUM: Magnesium: 2.2 mg/dL (ref 1.7–2.4)

## 2020-09-23 LAB — GLUCOSE, CAPILLARY
Glucose-Capillary: 182 mg/dL — ABNORMAL HIGH (ref 70–99)
Glucose-Capillary: 173 mg/dL — ABNORMAL HIGH (ref 70–99)
Glucose-Capillary: 331 mg/dL — ABNORMAL HIGH (ref 70–99)
Glucose-Capillary: 201 mg/dL — ABNORMAL HIGH (ref 70–99)
Glucose-Capillary: 149 mg/dL — ABNORMAL HIGH (ref 70–99)

## 2020-09-23 MED ORDER — INSULIN GLARGINE 100 UNIT/ML ~~LOC~~ SOLN
38.0000 [IU] | Freq: Two times a day (BID) | SUBCUTANEOUS | Status: DC
  Administered 2020-09-23 – 2020-09-28 (×11): 38 [IU] via SUBCUTANEOUS
  Filled 2020-09-23 (×12): qty 0.38

## 2020-09-23 NOTE — Progress Notes (Signed)
PROGRESS NOTE    Tim Day  DQQ:[id number] DOB: May 20, 1951 DOA: 08/19/2020 PCP: Celene Squibb, MD     Brief Narrative:  Tim Day is a 69 year old with recent Covid pneumonia (September 7th through 19ER), complicated by bilateral DVTs, was admitted 08/19/2020 with left pneumothorax requiring chest tube placement and subsequent intubation 08/20/2020. He was extubated the following day but has persistent bilateral infiltrates is presently requiring ongoing high flow nasal cannula 10 to 15 L oxygen. Hospital course is complicated by urinary retention presently on Foley catheter. He also had an episode of sudden loss of vision in his right eye which has resolved. Neurology work-up is negative.   New events last 24 hours / Subjective: Patient seen and examined. Primary RN and wife at the bedside. He has no complaint.  Assessment & Plan:   Active Problems:   DM2 (diabetes mellitus, type 2) ([place])   Benign essential HTN   Dyslipidemia   Chest tube in place   COVID-19 virus infection   Acute hypoxemic respiratory failure due to COVID-19 [facility])   Pneumothorax, left   Pneumonia due to COVID-19 virus   Leg DVT (deep venous thromboembolism), acute, bilateral ([facility])   Pressure injury of skin   Acute respiratory failure with hypoxia ([place])   HCAP (healthcare-associated pneumonia)   Primary spontaneous pneumothorax   Protein-calorie malnutrition, severe   Adjustment disorder with anxious mood   Acute hypoxic and hypercarbic respiratory failure  -Secondary to recent COVID-19 pneumonia (Sept 2021) s/p left pneumothorax -Weaned down to 3 L nasal cannula O2, continue to wean as tolerated  -BiPAP nightly due to hypercarbia overnight -Wean Solu-Medrol to prednisone  Acute urinary retention -Continue Foley catheter, patient has failed voiding trials x2 -Continue tamsulosin.  Continue Foley catheter and try voiding trial once he is more mobile.  If not, urology will see  him as outpatient and try VT.  Bilateral DVT -Continue Eliquis  Diabetes mellitus type 2 -Blood sugar still slightly elevated, increase Lantus from 35 to 38 units twice daily and continue sliding scale insulin, Tradjenta  Depression/anxiety -Continue Ativan as needed  Sudden unilateral vision loss -MRI brain neg -Resolved spontaneously  Left eye conjunctival hemorrhage: Patient developed conjunctival hemorrhage after working with PT on 09/22/2020. Patient's vision is intact and he does not have any pain. Patient has been reassured that this is benign and should resolve on its own in the next couple of weeks. Patient aware to notify staff of any eye pain or change in vision.  In agreement with assessment of the pressure ulcer as below:  Pressure Injury 08/19/20 Buttocks Bilateral Stage 2 -  Partial thickness loss of dermis presenting as a shallow open injury with a red, pink wound bed without slough. (Active)  08/19/20 1853  Location: Buttocks  Location Orientation: Bilateral  Staging: Stage 2 -  Partial thickness loss of dermis presenting as a shallow open injury with a red, pink wound bed without slough.  Wound Description (Comments):   Present on Admission: Yes     Nutrition Problem: Severe Malnutrition Etiology: acute illness (COVID19)   DVT prophylaxis:  SCDs Start: 08/19/20 1842 apixaban (ELIQUIS) tablet 5 mg  Code Status: Full code Family Communication: Wife at the bedside. Disposition Plan:  Status is: Inpatient  Remains inpatient appropriate because:Unsafe d/c plan   Dispo: The patient is from: Home              Anticipated d/c is to: CIR  Anticipated d/c date is: 1 day              Patient currently is medically stable to d/c.  Awaiting insurance authorization for CIR admission.  Case manager working on appeal.    Antimicrobials:  Anti-infectives (From admission, onward)   Start     Dose/Rate Route Frequency Ordered Stop   09/17/20 1730   piperacillin-tazobactam (ZOSYN) IVPB 3.375 g  Status:  Discontinued        3.375 g 12.5 mL/hr over 240 Minutes Intravenous Every 8 hours 09/17/20 1635 09/17/20 1928   09/03/20 2100  cefTRIAXone (ROCEPHIN) 2 g in sodium chloride 0.9 % 100 mL IVPB  Status:  Discontinued        2 g 200 mL/hr over 30 Minutes Intravenous Every 24 hours 09/03/20 1802 09/04/20 1443   08/31/20 2230  sulfamethoxazole-trimethoprim (BACTRIM DS) 800-160 MG per tablet 1 tablet  Status:  Discontinued        1 tablet Oral Every 12 hours 08/31/20 2201 09/06/20 1548   08/19/20 2000  piperacillin-tazobactam (ZOSYN) IVPB 3.375 g        3.375 g 12.5 mL/hr over 240 Minutes Intravenous Every 8 hours 08/19/20 1850 08/23/20 2359   08/19/20 1845  piperacillin-tazobactam (ZOSYN) IVPB 3.375 g  Status:  Discontinued        3.375 g 100 mL/hr over 30 Minutes Intravenous  Once 08/19/20 1838 08/19/20 1850   08/19/20 0900  vancomycin (VANCOREADY) IVPB 1500 mg/300 mL        1,500 mg 150 mL/hr over 120 Minutes Intravenous  Once 08/19/20 0831 08/19/20 1303   08/19/20 0845  piperacillin-tazobactam (ZOSYN) IVPB 3.375 g        3.375 g 100 mL/hr over 30 Minutes Intravenous  Once 08/19/20 0831 08/19/20 1103       Objective: Vitals:   09/22/20 2334 09/23/20 0450 09/23/20 0834 09/23/20 0934  BP: 135/84 136/89 119/74   Pulse: 98 90 93   Resp: (!) 36 20 (!) 29   Temp: 98.9 F (37.2 C) 97.9 F (36.6 C) 99.1 F (37.3 C)   TempSrc: Oral Oral Oral   SpO2: 98% 100% 99% 100%  Weight:      Height:        Intake/Output Summary (Last 24 hours) at 09/23/2020 1031 Last data filed at 09/23/2020 1020 Gross per 24 hour  Intake 600 ml  Output 3600 ml  Net -3000 ml   Filed Weights   09/20/20 0700 09/21/20 0517 09/22/20 0539  Weight: 82.1 kg 82.1 kg 84.8 kg    Examination:  General exam: Appears calm and comfortable  Respiratory system: Clear to auscultation. Respiratory effort normal. Cardiovascular system: S1 & S2 heard, RRR. No JVD,  murmurs, rubs, gallops or clicks. No pedal edema. Gastrointestinal system: Abdomen is nondistended, soft and nontender. No organomegaly or masses felt. Normal bowel sounds heard. Central nervous system: Alert and oriented. No focal neurological deficits. Extremities: Symmetric 5 x 5 power. Skin: No rashes, lesions or ulcers.  Psychiatry: Judgement and insight appear normal. Mood & affect appropriate.   Data Reviewed: I have personally reviewed following labs and imaging studies  CBC: Recent Labs  Lab 09/17/20 0203 09/19/20 0629 09/19/20 0758 09/20/20 0334 09/23/20 0306  WBC 7.3 6.6 6.8 7.2 8.6  NEUTROABS  --  4.6 4.8 5.3 5.7  HGB 10.4* 11.7* 11.2* 11.2* 11.9*  HCT 34.1* 38.1* 36.5* 37.2* 39.8  MCV 93.7 95.5 95.8 94.7 96.6  PLT 242 242 255 234 824   Basic Metabolic  Panel: Recent Labs  Lab 09/17/20 0203 09/19/20 0629 09/19/20 0758 09/19/20 1617 09/20/20 0334 09/20/20 0747 09/23/20 0306  NA 132* 133*  --   --   --  135 137  K 4.7 5.2*  --  4.9  --  5.0 4.8  CL 92* 91*  --   --   --  92* 94*  CO2 34* 34*  --   --   --  34* 37*  GLUCOSE 168* 262*  --   --   --  224* 137*  BUN 15 21  --   --   --  20 23  CREATININE 0.50* 0.59*  --   --   --  0.57* 0.59*  CALCIUM 8.0* 8.3*  --   --   --  8.6* 8.7*  MG  --  2.2 2.2  --  2.1  --  2.2  PHOS  --  4.7* 4.3  --  3.7  --   --    GFR: Estimated Creatinine Clearance: 92.5 mL/min (A) (by C-G formula based on SCr of 0.59 mg/dL (L)). Liver Function Tests: Recent Labs  Lab 09/17/20 0203 09/19/20 0629  AST 26 33  ALT 63* 74*  ALKPHOS 54 60  BILITOT 0.4 0.5  PROT 5.3* 6.0*  ALBUMIN 2.2* 2.6*   No results for input(s): LIPASE, AMYLASE in the last 168 hours. No results for input(s): AMMONIA in the last 168 hours. Coagulation Profile: No results for input(s): INR, PROTIME in the last 168 hours. Cardiac Enzymes: Recent Labs  Lab 09/18/20 0341  CKTOTAL 30*   BNP (last 3 results) No results for input(s): PROBNP in the last  8760 hours. HbA1C: No results for input(s): HGBA1C in the last 72 hours. CBG: Recent Labs  Lab 09/22/20 0819 09/22/20 1155 09/22/20 1712 09/22/20 2150 09/23/20 0830  GLUCAP 234* 135* 235* 207* 182*   Lipid Profile: No results for input(s): CHOL, HDL, LDLCALC, TRIG, CHOLHDL, LDLDIRECT in the last 72 hours. Thyroid Function Tests: No results for input(s): TSH, T4TOTAL, FREET4, T3FREE, THYROIDAB in the last 72 hours. Anemia Panel: No results for input(s): VITAMINB12, FOLATE, FERRITIN, TIBC, IRON, RETICCTPCT in the last 72 hours. Sepsis Labs: Recent Labs  Lab 09/17/20 1631 09/18/20 0341  PROCALCITON <0.10 <0.10    No results found for this or any previous visit (from the past 240 hour(s)).    Radiology Studies: No results found.    Scheduled Meds: . albuterol  2 puff Inhalation BID  . apixaban  5 mg Oral BID  . chlorhexidine  15 mL Mouth Rinse BID  . Chlorhexidine Gluconate Cloth  6 each Topical Daily  . collagenase   Topical BID  . docusate sodium  100 mg Oral BID  . feeding supplement  237 mL Oral TID BM  . hydrocortisone cream   Topical BID  . insulin aspart  0-15 Units Subcutaneous TID WC  . insulin aspart  0-5 Units Subcutaneous QHS  . insulin aspart  15 Units Subcutaneous TID WC  . insulin glargine  35 Units Subcutaneous BID  . linagliptin  5 mg Oral Daily  . mouth rinse  15 mL Mouth Rinse q12n4p  . melatonin  3 mg Oral QHS  . pantoprazole  40 mg Oral Daily  . predniSONE  40 mg Oral Q breakfast  . tamsulosin  0.4 mg Oral Daily   Continuous Infusions: . sodium chloride       LOS: 35 days   Time spent: 29 minutes   Darliss Cheney,  MD [facility] 09/23/2020, 10:31 AM   Available via Epic secure chat 7am-7pm After these hours, please refer to coverage provider listed on [email or web address]

## 2020-09-23 NOTE — Progress Notes (Signed)
Pt refused bipap for tonight 

## 2020-09-24 LAB — GLUCOSE, CAPILLARY
Glucose-Capillary: 159 mg/dL — ABNORMAL HIGH (ref 70–99)
Glucose-Capillary: 170 mg/dL — ABNORMAL HIGH (ref 70–99)
Glucose-Capillary: 272 mg/dL — ABNORMAL HIGH (ref 70–99)
Glucose-Capillary: 194 mg/dL — ABNORMAL HIGH (ref 70–99)

## 2020-09-24 MED ORDER — PREDNISONE 20 MG PO TABS: 30.0000 mg | ORAL_TABLET | Freq: Every day | ORAL | Status: DC

## 2020-09-24 MED ORDER — PREDNISONE 20 MG PO TABS
30.0000 mg | ORAL_TABLET | Freq: Every day | ORAL | Status: DC
  Administered 2020-09-24 – 2020-09-28 (×5): 30 mg via ORAL
  Filled 2020-09-24 (×5): qty 1

## 2020-09-24 NOTE — Progress Notes (Signed)
Pt refused bipap for tonight. [name] is in room, they will ask nurse to contact RT if they want bipap.

## 2020-09-24 NOTE — Progress Notes (Signed)
PROGRESS NOTE    Tim Day  BJY:[id number] DOB: 1951/06/30 DOA: 08/19/2020 PCP: Celene Squibb, MD     Brief Narrative:  Tim Day is a 69 year old with recent Covid pneumonia (September 7th through 08MV), complicated by bilateral DVTs, was admitted 08/19/2020 with left pneumothorax requiring chest tube placement and subsequent intubation 08/20/2020. He was extubated the following day but has persistent bilateral infiltrates is presently requiring ongoing high flow nasal cannula 10 to 15 L oxygen. Hospital course is complicated by urinary retention presently on Foley catheter. He also had an episode of sudden loss of vision in his right eye which has resolved. Neurology work-up is negative.   New events last 24 hours / Subjective: No new complaints this morning, doing well overall, some dry eyes but no other visual complaints.   Assessment & Plan:   Active Problems:   DM2 (diabetes mellitus, type 2) ([place])   Benign essential HTN   Dyslipidemia   Chest tube in place   COVID-19 virus infection   Acute hypoxemic respiratory failure due to COVID-19 [facility])   Pneumothorax, left   Pneumonia due to COVID-19 virus   Leg DVT (deep venous thromboembolism), acute, bilateral (HCC)   Pressure injury of skin   Acute respiratory failure with hypoxia ([place])   HCAP (healthcare-associated pneumonia)   Primary spontaneous pneumothorax   Protein-calorie malnutrition, severe   Adjustment disorder with anxious mood   Acute hypoxic and hypercarbic respiratory failure  -Secondary to recent COVID-19 pneumonia (Sept 2021) s/p left pneumothorax -Weaned down to 3 L nasal cannula O2, continue to wean as tolerated  -Continue to wean prednisone   Acute urinary retention -Continue Foley catheter, patient has failed voiding trials x2 -Continue tamsulosin. Continue Foley catheter and try voiding trial once he is more mobile.  If not, urology will see him as outpatient and try  VT.  Bilateral DVT -Continue Eliquis  Diabetes mellitus type 2 -Continue Lantus, sliding scale insulin, Tradjenta  Depression/anxiety -Continue Ativan as needed  Sudden unilateral vision loss -MRI brain neg -Resolved spontaneously  Left eye conjunctival hemorrhage -Stable, should resolve on its own over time    In agreement with assessment of the pressure ulcer as below:  Pressure Injury 08/19/20 Buttocks Bilateral Stage 2 -  Partial thickness loss of dermis presenting as a shallow open injury with a red, pink wound bed without slough. (Active)  08/19/20 1853  Location: Buttocks  Location Orientation: Bilateral  Staging: Stage 2 -  Partial thickness loss of dermis presenting as a shallow open injury with a red, pink wound bed without slough.  Wound Description (Comments):   Present on Admission: Yes     Nutrition Problem: Severe Malnutrition Etiology: acute illness (COVID19)   DVT prophylaxis:  SCDs Start: 08/19/20 1842 apixaban (ELIQUIS) tablet 5 mg  Code Status: Full code Family Communication: Family member at bedside Disposition Plan:  Status is: Inpatient  Remains inpatient appropriate because:Unsafe d/c plan   Dispo: The patient is from: Home              Anticipated d/c is to: CIR              Anticipated d/c date is: 1 day              Patient currently is medically stable to d/c. CIR insurance denial, family appealing.     Antimicrobials:  Anti-infectives (From admission, onward)   Start     Dose/Rate Route Frequency Ordered Stop   09/17/20 1730  piperacillin-tazobactam (ZOSYN) IVPB 3.375 g  Status:  Discontinued        3.375 g 12.5 mL/hr over 240 Minutes Intravenous Every 8 hours 09/17/20 1635 09/17/20 1928   09/03/20 2100  cefTRIAXone (ROCEPHIN) 2 g in sodium chloride 0.9 % 100 mL IVPB  Status:  Discontinued        2 g 200 mL/hr over 30 Minutes Intravenous Every 24 hours 09/03/20 1802 09/04/20 1443   08/31/20 2230  sulfamethoxazole-trimethoprim  (BACTRIM DS) 800-160 MG per tablet 1 tablet  Status:  Discontinued        1 tablet Oral Every 12 hours 08/31/20 2201 09/06/20 1548   08/19/20 2000  piperacillin-tazobactam (ZOSYN) IVPB 3.375 g        3.375 g 12.5 mL/hr over 240 Minutes Intravenous Every 8 hours 08/19/20 1850 08/23/20 2359   08/19/20 1845  piperacillin-tazobactam (ZOSYN) IVPB 3.375 g  Status:  Discontinued        3.375 g 100 mL/hr over 30 Minutes Intravenous  Once 08/19/20 1838 08/19/20 1850   08/19/20 0900  vancomycin (VANCOREADY) IVPB 1500 mg/300 mL        1,500 mg 150 mL/hr over 120 Minutes Intravenous  Once 08/19/20 0831 08/19/20 1303   08/19/20 0845  piperacillin-tazobactam (ZOSYN) IVPB 3.375 g        3.375 g 100 mL/hr over 30 Minutes Intravenous  Once 08/19/20 0831 08/19/20 1103       Objective: Vitals:   09/24/20 0041 09/24/20 0207 09/24/20 0500 09/24/20 0806  BP: 129/82  123/84 119/67  Pulse: 92 89 84 99  Resp: 19 (!) 22 (!) 22 (!) 31  Temp: 98.4 F (36.9 C)  98.6 F (37 C) 97.9 F (36.6 C)  TempSrc: Axillary  Oral Oral  SpO2: 99% 99% 99% 98%  Weight:   82.1 kg   Height:        Intake/Output Summary (Last 24 hours) at 09/24/2020 0821 Last data filed at 09/24/2020 0459 Gross per 24 hour  Intake 480 ml  Output 4800 ml  Net -4320 ml   Filed Weights   09/21/20 0517 09/22/20 0539 09/24/20 0500  Weight: 82.1 kg 84.8 kg 82.1 kg    Examination: General exam: Appears calm and comfortable  Respiratory system: Clear to auscultation. Respiratory effort normal. Cardiovascular system: S1 & S2 heard, RRR. No pedal edema. Gastrointestinal system: Abdomen is nondistended, soft and nontender. Normal bowel sounds heard. Central nervous system: Alert and oriented. Non focal exam. Speech clear  Extremities: Symmetric in appearance bilaterally  Skin: No rashes, lesions or ulcers on exposed skin  Psychiatry: Judgement and insight appear stable. Mood & affect appropriate.    Data Reviewed: I have personally  reviewed following labs and imaging studies  CBC: Recent Labs  Lab 09/19/20 0629 09/19/20 0758 09/20/20 0334 09/23/20 0306  WBC 6.6 6.8 7.2 8.6  NEUTROABS 4.6 4.8 5.3 5.7  HGB 11.7* 11.2* 11.2* 11.9*  HCT 38.1* 36.5* 37.2* 39.8  MCV 95.5 95.8 94.7 96.6  PLT 242 255 234 102   Basic Metabolic Panel: Recent Labs  Lab 09/19/20 0629 09/19/20 0758 09/19/20 1617 09/20/20 0334 09/20/20 0747 09/23/20 0306  NA 133*  --   --   --  135 137  K 5.2*  --  4.9  --  5.0 4.8  CL 91*  --   --   --  92* 94*  CO2 34*  --   --   --  34* 37*  GLUCOSE 262*  --   --   --  224* 137*  BUN 21  --   --   --  20 23  CREATININE 0.59*  --   --   --  0.57* 0.59*  CALCIUM 8.3*  --   --   --  8.6* 8.7*  MG 2.2 2.2  --  2.1  --  2.2  PHOS 4.7* 4.3  --  3.7  --   --    GFR: Estimated Creatinine Clearance: 92.5 mL/min (A) (by C-G formula based on SCr of 0.59 mg/dL (L)). Liver Function Tests: Recent Labs  Lab 09/19/20 0629  AST 33  ALT 74*  ALKPHOS 60  BILITOT 0.5  PROT 6.0*  ALBUMIN 2.6*   No results for input(s): LIPASE, AMYLASE in the last 168 hours. No results for input(s): AMMONIA in the last 168 hours. Coagulation Profile: No results for input(s): INR, PROTIME in the last 168 hours. Cardiac Enzymes: Recent Labs  Lab 09/18/20 0341  CKTOTAL 30*   BNP (last 3 results) No results for input(s): PROBNP in the last 8760 hours. HbA1C: No results for input(s): HGBA1C in the last 72 hours. CBG: Recent Labs  Lab 09/23/20 1206 09/23/20 1619 09/23/20 1807 09/23/20 2143 09/24/20 0803  GLUCAP 173* 331* 201* 149* 159*   Lipid Profile: No results for input(s): CHOL, HDL, LDLCALC, TRIG, CHOLHDL, LDLDIRECT in the last 72 hours. Thyroid Function Tests: No results for input(s): TSH, T4TOTAL, FREET4, T3FREE, THYROIDAB in the last 72 hours. Anemia Panel: No results for input(s): VITAMINB12, FOLATE, FERRITIN, TIBC, IRON, RETICCTPCT in the last 72 hours. Sepsis Labs: Recent Labs  Lab  09/17/20 1631 09/18/20 0341  PROCALCITON <0.10 <0.10    No results found for this or any previous visit (from the past 240 hour(s)).    Radiology Studies: No results found.    Scheduled Meds: . albuterol  2 puff Inhalation BID  . apixaban  5 mg Oral BID  . chlorhexidine  15 mL Mouth Rinse BID  . Chlorhexidine Gluconate Cloth  6 each Topical Daily  . collagenase   Topical BID  . docusate sodium  100 mg Oral BID  . feeding supplement  237 mL Oral TID BM  . hydrocortisone cream   Topical BID  . insulin aspart  0-15 Units Subcutaneous TID WC  . insulin aspart  0-5 Units Subcutaneous QHS  . insulin aspart  15 Units Subcutaneous TID WC  . insulin glargine  38 Units Subcutaneous BID  . linagliptin  5 mg Oral Daily  . mouth rinse  15 mL Mouth Rinse q12n4p  . melatonin  3 mg Oral QHS  . pantoprazole  40 mg Oral Daily  . predniSONE  40 mg Oral Q breakfast  . tamsulosin  0.4 mg Oral Daily   Continuous Infusions: . sodium chloride       LOS: 36 days      Time spent: 20 minutes   Dessa Phi, DO [facility] 09/24/2020, 8:21 AM   Available via Epic secure chat 7am-7pm After these hours, please refer to coverage provider listed on [email or web address]

## 2020-09-25 LAB — GLUCOSE, CAPILLARY
Glucose-Capillary: 71 mg/dL (ref 70–99)
Glucose-Capillary: 292 mg/dL — ABNORMAL HIGH (ref 70–99)
Glucose-Capillary: 217 mg/dL — ABNORMAL HIGH (ref 70–99)
Glucose-Capillary: 150 mg/dL — ABNORMAL HIGH (ref 70–99)

## 2020-09-25 MED ORDER — ALBUTEROL SULFATE HFA 108 (90 BASE) MCG/ACT IN AERS: 2.0000 | INHALATION_SPRAY | Freq: Four times a day (QID) | RESPIRATORY_TRACT | Status: DC | PRN

## 2020-09-25 NOTE — Progress Notes (Signed)
PROGRESS NOTE    Tim Day  BEE:[id number] DOB: 01-23-1951 DOA: 08/19/2020 PCP: Celene Squibb, MD     Brief Narrative:  Tim Day is a 69 year old with recent Covid pneumonia (September 7th through 58IT), complicated by bilateral DVTs, was admitted 08/19/2020 with left pneumothorax requiring chest tube placement and subsequent intubation 08/20/2020. He was extubated the following day but has persistent bilateral infiltrates is presently requiring ongoing high flow nasal cannula 10 to 15 L oxygen. Hospital course is complicated by urinary retention presently on Foley catheter. He also had an episode of sudden loss of vision in his right eye which has resolved. Neurology work-up is negative.   New events last 24 hours / Subjective: No new issues on examination today.  Oxygen weaned to 2 L  Assessment & Plan:   Active Problems:   DM2 (diabetes mellitus, type 2) ([place])   Benign essential HTN   Dyslipidemia   Chest tube in place   COVID-19 virus infection   Acute hypoxemic respiratory failure due to COVID-19 [facility])   Pneumothorax, left   Pneumonia due to COVID-19 virus   Leg DVT (deep venous thromboembolism), acute, bilateral (HCC)   Pressure injury of skin   Acute respiratory failure with hypoxia ([place])   HCAP (healthcare-associated pneumonia)   Primary spontaneous pneumothorax   Protein-calorie malnutrition, severe   Adjustment disorder with anxious mood   Acute hypoxic and hypercarbic respiratory failure  -Secondary to recent COVID-19 pneumonia (Sept 2021) s/p left pneumothorax -Weaned down to 2 L nasal cannula O2, continue to wean as tolerated  -Continue to wean prednisone   Acute urinary retention -Continue Foley catheter, patient has failed voiding trials x2 -Continue tamsulosin. Continue Foley catheter and try voiding trial once he is more mobile.  If not, urology will see him as outpatient and try VT.  Bilateral DVT -Continue  Eliquis  Diabetes mellitus type 2 -Continue Lantus, sliding scale insulin, Tradjenta  Depression/anxiety -Continue Ativan as needed  Sudden unilateral vision loss -MRI brain neg -Resolved spontaneously  Left eye conjunctival hemorrhage -Stable, should resolve on its own over time    In agreement with assessment of the pressure ulcer as below:  Pressure Injury 08/19/20 Buttocks Bilateral Stage 2 -  Partial thickness loss of dermis presenting as a shallow open injury with a red, pink wound bed without slough. (Active)  08/19/20 1853  Location: Buttocks  Location Orientation: Bilateral  Staging: Stage 2 -  Partial thickness loss of dermis presenting as a shallow open injury with a red, pink wound bed without slough.  Wound Description (Comments):   Present on Admission: Yes     Nutrition Problem: Severe Malnutrition Etiology: acute illness (COVID19)   DVT prophylaxis:  SCDs Start: 08/19/20 1842 apixaban (ELIQUIS) tablet 5 mg  Code Status: Full code Family Communication: Family member at bedside Disposition Plan:  Status is: Inpatient  Remains inpatient appropriate because:Unsafe d/c plan   Dispo: The patient is from: Home              Anticipated d/c is to: CIR              Anticipated d/c date is: 1 day              Patient currently is medically stable to d/c. CIR insurance denial, family appealing.     Antimicrobials:  Anti-infectives (From admission, onward)   Start     Dose/Rate Route Frequency Ordered Stop   09/17/20 1730  piperacillin-tazobactam (ZOSYN) IVPB 3.375 g  Status:  Discontinued        3.375 g 12.5 mL/hr over 240 Minutes Intravenous Every 8 hours 09/17/20 1635 09/17/20 1928   09/03/20 2100  cefTRIAXone (ROCEPHIN) 2 g in sodium chloride 0.9 % 100 mL IVPB  Status:  Discontinued        2 g 200 mL/hr over 30 Minutes Intravenous Every 24 hours 09/03/20 1802 09/04/20 1443   08/31/20 2230  sulfamethoxazole-trimethoprim (BACTRIM DS) 800-160 MG per  tablet 1 tablet  Status:  Discontinued        1 tablet Oral Every 12 hours 08/31/20 2201 09/06/20 1548   08/19/20 2000  piperacillin-tazobactam (ZOSYN) IVPB 3.375 g        3.375 g 12.5 mL/hr over 240 Minutes Intravenous Every 8 hours 08/19/20 1850 08/23/20 2359   08/19/20 1845  piperacillin-tazobactam (ZOSYN) IVPB 3.375 g  Status:  Discontinued        3.375 g 100 mL/hr over 30 Minutes Intravenous  Once 08/19/20 1838 08/19/20 1850   08/19/20 0900  vancomycin (VANCOREADY) IVPB 1500 mg/300 mL        1,500 mg 150 mL/hr over 120 Minutes Intravenous  Once 08/19/20 0831 08/19/20 1303   08/19/20 0845  piperacillin-tazobactam (ZOSYN) IVPB 3.375 g        3.375 g 100 mL/hr over 30 Minutes Intravenous  Once 08/19/20 0831 08/19/20 1103       Objective: Vitals:   09/25/20 0211 09/25/20 0400 09/25/20 0740 09/25/20 0847  BP:  128/83 124/82   Pulse: 95 89 87   Resp: (!) 23 (!) 25 (!) 25   Temp:  98.8 F (37.1 C) 98.4 F (36.9 C)   TempSrc:  Oral Oral   SpO2: 98% 100% 100% 98%  Weight:  85.3 kg    Height:        Intake/Output Summary (Last 24 hours) at 09/25/2020 1033 Last data filed at 09/25/2020 0403 Gross per 24 hour  Intake --  Output 3950 ml  Net -3950 ml   Filed Weights   09/22/20 0539 09/24/20 0500 09/25/20 0400  Weight: 84.8 kg 82.1 kg 85.3 kg    Examination: General exam: Appears calm and comfortable  Respiratory system: Clear to auscultation. Respiratory effort normal.  Weaned to 2 L oxygen today Cardiovascular system: S1 & S2 heard, RRR. No pedal edema. Gastrointestinal system: Abdomen is nondistended, soft and nontender. Normal bowel sounds heard. Central nervous system: Alert and oriented. Non focal exam. Speech clear  Extremities: Symmetric in appearance bilaterally  Skin: No rashes, lesions or ulcers on exposed skin  Psychiatry: Judgement and insight appear stable. Mood & affect appropriate.    Data Reviewed: I have personally reviewed following labs and imaging  studies  CBC: Recent Labs  Lab 09/19/20 0629 09/19/20 0758 09/20/20 0334 09/23/20 0306  WBC 6.6 6.8 7.2 8.6  NEUTROABS 4.6 4.8 5.3 5.7  HGB 11.7* 11.2* 11.2* 11.9*  HCT 38.1* 36.5* 37.2* 39.8  MCV 95.5 95.8 94.7 96.6  PLT 242 255 234 563   Basic Metabolic Panel: Recent Labs  Lab 09/19/20 0629 09/19/20 0758 09/19/20 1617 09/20/20 0334 09/20/20 0747 09/23/20 0306  NA 133*  --   --   --  135 137  K 5.2*  --  4.9  --  5.0 4.8  CL 91*  --   --   --  92* 94*  CO2 34*  --   --   --  34* 37*  GLUCOSE 262*  --   --   --  224* 137*  BUN 21  --   --   --  20 23  CREATININE 0.59*  --   --   --  0.57* 0.59*  CALCIUM 8.3*  --   --   --  8.6* 8.7*  MG 2.2 2.2  --  2.1  --  2.2  PHOS 4.7* 4.3  --  3.7  --   --    GFR: Estimated Creatinine Clearance: 92.5 mL/min (A) (by C-G formula based on SCr of 0.59 mg/dL (L)). Liver Function Tests: Recent Labs  Lab 09/19/20 0629  AST 33  ALT 74*  ALKPHOS 60  BILITOT 0.5  PROT 6.0*  ALBUMIN 2.6*   No results for input(s): LIPASE, AMYLASE in the last 168 hours. No results for input(s): AMMONIA in the last 168 hours. Coagulation Profile: No results for input(s): INR, PROTIME in the last 168 hours. Cardiac Enzymes: No results for input(s): CKTOTAL, CKMB, CKMBINDEX, TROPONINI in the last 168 hours. BNP (last 3 results) No results for input(s): PROBNP in the last 8760 hours. HbA1C: No results for input(s): HGBA1C in the last 72 hours. CBG: Recent Labs  Lab 09/23/20 2143 09/24/20 0803 09/24/20 1125 09/24/20 1626 09/24/20 2114  GLUCAP 149* 159* 170* 272* 194*   Lipid Profile: No results for input(s): CHOL, HDL, LDLCALC, TRIG, CHOLHDL, LDLDIRECT in the last 72 hours. Thyroid Function Tests: No results for input(s): TSH, T4TOTAL, FREET4, T3FREE, THYROIDAB in the last 72 hours. Anemia Panel: No results for input(s): VITAMINB12, FOLATE, FERRITIN, TIBC, IRON, RETICCTPCT in the last 72 hours. Sepsis Labs: No results for input(s):  PROCALCITON, LATICACIDVEN in the last 168 hours.  No results found for this or any previous visit (from the past 240 hour(s)).    Radiology Studies: No results found.    Scheduled Meds:  albuterol  2 puff Inhalation BID   apixaban  5 mg Oral BID   chlorhexidine  15 mL Mouth Rinse BID   Chlorhexidine Gluconate Cloth  6 each Topical Daily   collagenase   Topical BID   docusate sodium  100 mg Oral BID   feeding supplement  237 mL Oral TID BM   hydrocortisone cream   Topical BID   insulin aspart  0-15 Units Subcutaneous TID WC   insulin aspart  0-5 Units Subcutaneous QHS   insulin aspart  15 Units Subcutaneous TID WC   insulin glargine  38 Units Subcutaneous BID   linagliptin  5 mg Oral Daily   mouth rinse  15 mL Mouth Rinse q12n4p   melatonin  3 mg Oral QHS   pantoprazole  40 mg Oral Daily   predniSONE  30 mg Oral Q breakfast   tamsulosin  0.4 mg Oral Daily   Continuous Infusions:  sodium chloride       LOS: 37 days      Time spent: 20 minutes   Dessa Phi, DO [facility] 09/25/2020, 10:33 AM   Available via Epic secure chat 7am-7pm After these hours, please refer to coverage provider listed on [email or web address]

## 2020-09-25 NOTE — Progress Notes (Signed)
Inpatient Rehabilitation Admissions Coordinator  No decision from insurance appeal at this time.  Danne Baxter, RN, MSN Rehab Admissions Coordinator [phone] 09/25/2020 2:54 PM

## 2020-09-25 NOTE — Progress Notes (Signed)
Physical Therapy Treatment Patient Details Name: Tim Day MRN: [id number] DOB: [birth year] Today's Date: 09/25/2020    History of Present Illness 69 year old with hx of recent COVID pneumonia diagnosis (9/7-9/13, discharge on 2L O2), BL COVID induced DVTs, DM2, GERD HTN presenting with worsening SOB. Per chart found to have left pneumothorax and worsening bilateral infiltrates. Intubated 9/19-9/20, Chest tube removed 9/28.     PT Comments    Pt continues to make slow, steady progress. Continue to recommend CIR. Pt's anxiety continues to slow progress.    Follow Up Recommendations  CIR     Equipment Recommendations  Rolling walker with 5" wheels;Wheelchair (measurements PT);3in1 (PT);Wheelchair cushion (measurements PT);Other (comment)    Recommendations for Other Services       Precautions / Restrictions Precautions Precautions: Fall Precaution Comments: High RR, watch sats    Mobility  Bed Mobility Overal bed mobility: Needs Assistance Bed Mobility: Rolling;Sidelying to Sit Rolling: Min assist Sidelying to sit: Mod assist;HOB elevated       General bed mobility comments: Assist to elevate trunk into sitting  Transfers Overall transfer level: Needs assistance Equipment used: Rolling walker (2 wheeled) Transfers: Sit to/from [facility] Sit to Stand: Mod assist;+2 safety/equipment Stand pivot transfers: Mod assist;+2 safety/equipment       General transfer comment: Assist to bring hips up and for balance. Repeated verbal cues for hand placement. Bed to bsc with walker with stand pivot  Ambulation/Gait Ambulation/Gait assistance: Mod assist;+2 safety/equipment Gait Distance (Feet): 10 Feet Assistive device: Rolling walker (2 wheeled) Gait Pattern/deviations: Step-to pattern;Shuffle;Narrow base of support;Decreased stride length;Decreased step length - right;Decreased step length - left Gait velocity: decr Gait velocity  interpretation: <1.31 ft/sec, indicative of household ambulator General Gait Details: Assist for balance and support. Pt needs frequent verbal encouragement to continue.    Stairs             Wheelchair Mobility    Modified Rankin (Stroke Patients Only)       Balance Overall balance assessment: Needs assistance Sitting-balance support: Bilateral upper extremity supported;Feet supported Sitting balance-Leahy Scale: Poor Sitting balance - Comments: Required UE but stable at EOB.   Standing balance support: Bilateral upper extremity supported;During functional activity Standing balance-Leahy Scale: Poor Standing balance comment: walker and min assist. Stood x 90 sec for pericare                            Cognition Arousal/Alertness: Awake/alert Behavior During Therapy: Anxious Overall Cognitive Status: Impaired/Different from baseline Area of Impairment: Safety/judgement;Problem solving                         Safety/Judgement: Decreased awareness of safety   Problem Solving: Slow processing;Difficulty sequencing;Requires tactile cues;Requires verbal cues General Comments: Frequent reassurance to ease anxiety      Exercises      General Comments General comments (skin integrity, edema, etc.): At rest pt on 3L with SpO2 >90%. Incr to 4L with activity with SpO2 to 85%. Recovered back to > 90% after several minutes      Pertinent Vitals/Pain Pain Assessment: No/denies pain    Home Living                      Prior Function            PT Goals (current goals can now be found in the care plan section) Acute Rehab PT  Goals Patient Stated Goal: to get better Progress towards PT goals: Progressing toward goals    Frequency    Min 3X/week      PT Plan Current plan remains appropriate    Co-evaluation              AM-PAC PT "6 Clicks" Mobility   Outcome Measure  Help needed turning from your back to your side while  in a flat bed without using bedrails?: A Little Help needed moving from lying on your back to sitting on the side of a flat bed without using bedrails?: A Lot Help needed moving to and from a bed to a chair (including a wheelchair)?: A Lot Help needed standing up from a chair using your arms (e.g., wheelchair or bedside chair)?: A Lot Help needed to walk in hospital room?: A Lot Help needed climbing 3-5 steps with a railing? : Total 6 Click Score: 12    End of Session Equipment Utilized During Treatment: Gait belt;Oxygen Activity Tolerance: Other (comment) (limited due to anxiety) Patient left: with call bell/phone within reach;with family/visitor present;in chair Nurse Communication: Mobility status PT Visit Diagnosis: Other abnormalities of gait and mobility (R26.89);Muscle weakness (generalized) (M62.81);Difficulty in walking, not elsewhere classified (R26.2)     Time: 4431-5400 PT Time Calculation (min) (ACUTE ONLY): 25 min  Charges:  $Gait Training: 23-37 mins                     [place] Pager [phone] Office [place] 09/25/2020, 2:12 PM

## 2020-09-26 LAB — GLUCOSE, CAPILLARY
Glucose-Capillary: 175 mg/dL — ABNORMAL HIGH (ref 70–99)
Glucose-Capillary: 172 mg/dL — ABNORMAL HIGH (ref 70–99)
Glucose-Capillary: 225 mg/dL — ABNORMAL HIGH (ref 70–99)
Glucose-Capillary: 227 mg/dL — ABNORMAL HIGH (ref 70–99)

## 2020-09-26 NOTE — TOC Initial Note (Signed)
Transition of Care [facility]) - Initial/Assessment Note    Patient Details  Name: Padraic Marinos MRN: [id number] Date of Birth: Jun 08, 1951  Transition of Care [facility]) CM/SW Contact:    Trula Ore, [place] Phone Number: 09/26/2020, 4:55 PM  Clinical Narrative:                   CSW received consult for possible SNF placement at time of discharge. CSW spoke with patients spouse Inez Catalina regarding PT recommendation of SNF placement at time of discharge. Patients spouse Inez Catalina reported that she is currently unable to care for patient at their home given patient's current physical needs and fall risk. Patients spouse expressed understanding of PT recommendation and is agreeable to SNF placement at time of discharge. Patients spouse gave CSW permission to fax out initial referral to [place] area . CSW discussed insurance authorization process. Patient has not received the COVID vaccines. Patient would like to receive covid vaccine. CSW informed MD. No further questions reported at this time. CSW to continue to follow and assist with discharge planning needs.  Expected Discharge Plan: Skilled Nursing Facility Barriers to Discharge: Continued Medical Work up   Patient Goals and CMS Choice Patient states their goals for this hospitalization and ongoing recovery are:: to go to SNF CMS Medicare.gov Compare Post Acute Care list provided to:: Patient Represenative (must comment) Inez Catalina) Choice offered to / list presented to : Spouse Inez Catalina)  Expected Discharge Plan and Services Expected Discharge Plan: Skilled Nursing Facility In-house Referral: Clinical Social Work Discharge Planning Services: CM Consult   Living arrangements for the past 2 months: [place]                                      Prior Living Arrangements/Services Living arrangements for the past 2 months: Single Family Home Lives with:: Self, Spouse Patient language and need for interpreter  reviewed:: Yes Do you feel safe going back to the place where you live?: No   SNF  Need for Family Participation in Patient Care: Yes (Comment) Care giver support system in place?: Yes (comment)   Criminal Activity/Legal Involvement Pertinent to Current Situation/Hospitalization: No - Comment as needed  Activities of Daily Living Home Assistive Devices/Equipment: Oxygen ADL Screening (condition at time of admission) Patient's cognitive ability adequate to safely complete daily activities?: Yes Is the patient deaf or have difficulty hearing?: No Does the patient have difficulty seeing, even when wearing glasses/contacts?: No Does the patient have difficulty concentrating, remembering, or making decisions?: No Patient able to express need for assistance with ADLs?: Yes Does the patient have difficulty dressing or bathing?: No Independently performs ADLs?: Yes (appropriate for developmental age) Does the patient have difficulty walking or climbing stairs?: Yes Weakness of Legs: Both Weakness of Arms/Hands: None  Permission Sought/Granted Permission sought to share information with : Case Manager, Family Supports, Customer service manager                Emotional Assessment       Orientation: : Oriented to Self, Oriented to Place, Oriented to  Time, Oriented to Situation Alcohol / Substance Use: Not Applicable Psych Involvement: No (comment)  Admission diagnosis:  Hyponatremia [E87.1] Pneumothorax, left [J93.9] Acute respiratory failure with hypoxia ([place]) [J96.01] HCAP (healthcare-associated pneumonia) [J18.9] Pneumothorax [J93.9] Chest tube in place [Z96.89] COVID-19 virus infection [U07.1] Patient Active Problem List   Diagnosis Date Noted  . Adjustment  disorder with anxious mood 09/15/2020  . Protein-calorie malnutrition, severe 08/25/2020  . Primary spontaneous pneumothorax   . HCAP (healthcare-associated pneumonia)   . Pressure injury of skin 08/20/2020  .  Acute respiratory failure with hypoxia ([place])   . Pneumothorax, left 08/19/2020  . Pneumonia due to COVID-19 virus 08/19/2020  . Leg DVT (deep venous thromboembolism), acute, bilateral ([place]) 08/19/2020  . Acute hypoxemic respiratory failure due to COVID-19 ([place]) 08/08/2020  . Sepsis ([place]) 08/08/2020  . Hyponatremia 08/08/2020  . Hypocalcemia 08/03/2020  . Transaminitis 08/03/2020  . COVID-19 virus infection 08/02/2020  . Thrombocytopenia ([place]) 08/02/2020  . Leukopenia 08/02/2020  . Esophageal reflux   . Chest tube in place 01/22/2015  . Hypercholesteremia   . Hypertension   . Cardiogenic shock ([place]) 01/20/2015  . DM2 (diabetes mellitus, type 2) ([place]) 01/20/2015  . Benign essential HTN 01/20/2015  . Dyslipidemia 01/20/2015   PCP:  Celene Squibb, MD Pharmacy:   [facility], [address] [place] [address] Phone: [phone] Fax: [phone]     Social Determinants of Health (SDOH) Interventions    Readmission Risk Interventions No flowsheet data found.

## 2020-09-26 NOTE — Progress Notes (Signed)
PROGRESS NOTE    Tim Day  YCX:[id number] DOB: 09/11/1951 DOA: 08/19/2020 PCP: Celene Squibb, MD     Brief Narrative:  Tim Day is a 69 year old with recent Covid pneumonia (September 7th through [facility]FW), complicated by bilateral DVTs, was admitted 08/19/2020 with left pneumothorax requiring chest tube placement and subsequent intubation 08/20/2020. He was extubated the following day but has persistent bilateral infiltrates is presently requiring ongoing high flow nasal cannula 10 to 15 L oxygen. Hospital course is complicated by urinary retention presently on Foley catheter. He also had an episode of sudden loss of vision in his right eye which has resolved. Neurology work-up is negative.   New events last 24 hours / Subjective: No new complaints on examination.  Awaiting insurance appeal process  Assessment & Plan:   Active Problems:   DM2 (diabetes mellitus, type 2) ([place])   Benign essential HTN   Dyslipidemia   Chest tube in place   COVID-19 virus infection   Acute hypoxemic respiratory failure due to COVID-19 [facility])   Pneumothorax, left   Pneumonia due to COVID-19 virus   Leg DVT (deep venous thromboembolism), acute, bilateral (H[facility])   Pressure injury of skin   Acute respiratory failure with hypoxia ([place])   HCAP (healthcare-associated pneumonia)   Primary spontaneous pneumothorax   Protein-calorie malnutrition, severe   Adjustment disorder with anxious mood   Acute hypoxic and hypercarbic respiratory failure  -Secondary to recent COVID-19 pneumonia (Sept 2021) s/p left pneumothorax -Weaned down to 2 L nasal cannula O2, continue to wean as tolerated  -Continue to wean prednisone   Acute urinary retention -Continue Foley catheter, patient has failed voiding trials x2 -Continue tamsulosin. Continue Foley catheter and try voiding trial once he is more mobile.  If not, urology will see him as outpatient and try voiding trial.  Bilateral  DVT -Continue Eliquis  Diabetes mellitus type 2 -Continue Lantus, sliding scale insulin, Tradjenta  Depression/anxiety -Continue Ativan as needed  Sudden unilateral vision loss -MRI brain neg -Resolved spontaneously  Left eye conjunctival hemorrhage -Stable, should resolve on its own over time    In agreement with assessment of the pressure ulcer as below:  Pressure Injury 08/19/20 Buttocks Bilateral Stage 2 -  Partial thickness loss of dermis presenting as a shallow open injury with a red, pink wound bed without slough. (Active)  08/19/20 1853  Location: Buttocks  Location Orientation: Bilateral  Staging: Stage 2 -  Partial thickness loss of dermis presenting as a shallow open injury with a red, pink wound bed without slough.  Wound Description (Comments):   Present on Admission: Yes     Nutrition Problem: Severe Malnutrition Etiology: acute illness (COVID19)   DVT prophylaxis:  SCDs Start: 08/19/20 1842 apixaban (ELIQUIS) tablet 5 mg  Code Status: Full code Family Communication: Family member at bedside Disposition Plan:  Status is: Inpatient  Remains inpatient appropriate because:Unsafe d/c plan   Dispo: The patient is from: Home              Anticipated d/c is to: CIR              Anticipated d/c date is: 1 day              Patient currently is medically stable to d/c. CIR insurance denial, family appealing.     Antimicrobials:  Anti-infectives (From admission, onward)   Start     Dose/Rate Route Frequency Ordered Stop   09/17/20 1730  piperacillin-tazobactam (ZOSYN) IVPB 3.375 g  Status:  Discontinued        3.375 g 12.5 mL/hr over 240 Minutes Intravenous Every 8 hours 09/17/20 1635 09/17/20 1928   09/03/20 2100  cefTRIAXone (ROCEPHIN) 2 g in sodium chloride 0.9 % 100 mL IVPB  Status:  Discontinued        2 g 200 mL/hr over 30 Minutes Intravenous Every 24 hours 09/03/20 1802 09/04/20 1443   08/31/20 2230  sulfamethoxazole-trimethoprim (BACTRIM DS)  800-160 MG per tablet 1 tablet  Status:  Discontinued        1 tablet Oral Every 12 hours 08/31/20 2201 09/06/20 1548   08/19/20 2000  piperacillin-tazobactam (ZOSYN) IVPB 3.375 g        3.375 g 12.5 mL/hr over 240 Minutes Intravenous Every 8 hours 08/19/20 1850 08/23/20 2359   08/19/20 1845  piperacillin-tazobactam (ZOSYN) IVPB 3.375 g  Status:  Discontinued        3.375 g 100 mL/hr over 30 Minutes Intravenous  Once 08/19/20 1838 08/19/20 1850   08/19/20 0900  vancomycin (VANCOREADY) IVPB 1500 mg/300 mL        1,500 mg 150 mL/hr over 120 Minutes Intravenous  Once 08/19/20 0831 08/19/20 1303   08/19/20 0845  piperacillin-tazobactam (ZOSYN) IVPB 3.375 g        3.375 g 100 mL/hr over 30 Minutes Intravenous  Once 08/19/20 0831 08/19/20 1103       Objective: Vitals:   09/25/20 2119 09/26/20 0008 09/26/20 0022 09/26/20 0700  BP: 130/79  (!) 142/83 126/82  Pulse: 100 (!) 103 (!) 104 95  Resp: (!) 27 (!) 46 (!) 27 (!) 27  Temp: 98.6 F (37 C)  98.9 F (37.2 C) 98.8 F (37.1 C)  TempSrc: Oral  Axillary Oral  SpO2: 97%  99% 100%  Weight:    86.2 kg  Height:        Intake/Output Summary (Last 24 hours) at 09/26/2020 1012 Last data filed at 09/26/2020 0700 Gross per 24 hour  Intake 620 ml  Output 2625 ml  Net -2005 ml   Filed Weights   09/24/20 0500 09/25/20 0400 09/26/20 0700  Weight: 82.1 kg 85.3 kg 86.2 kg    Examination: General exam: Appears calm and comfortable  Respiratory system: Clear to auscultation. Respiratory effort normal.  On 2 L nasal cannula O2 Cardiovascular system: S1 & S2 heard, RRR. No pedal edema. Gastrointestinal system: Abdomen is nondistended, soft and nontender. Normal bowel sounds heard. Central nervous system: Alert and oriented. Non focal exam. Speech clear  Extremities: Symmetric in appearance bilaterally  Skin: No rashes, lesions or ulcers on exposed skin  Psychiatry: Judgement and insight appear stable. Mood & affect appropriate.   Data  Reviewed: I have personally reviewed following labs and imaging studies  CBC: Recent Labs  Lab 09/20/20 0334 09/23/20 0306  WBC 7.2 8.6  NEUTROABS 5.3 5.7  HGB 11.2* 11.9*  HCT 37.2* 39.8  MCV 94.7 96.6  PLT 234 425   Basic Metabolic Panel: Recent Labs  Lab 09/19/20 1617 09/20/20 0334 09/20/20 0747 09/23/20 0306  NA  --   --  135 137  K 4.9  --  5.0 4.8  CL  --   --  92* 94*  CO2  --   --  34* 37*  GLUCOSE  --   --  224* 137*  BUN  --   --  20 23  CREATININE  --   --  0.57* 0.59*  CALCIUM  --   --  8.6* 8.7*  MG  --  2.1  --  2.2  PHOS  --  3.7  --   --    GFR: Estimated Creatinine Clearance: 92.5 mL/min (A) (by C-G formula based on SCr of 0.59 mg/dL (L)). Liver Function Tests: No results for input(s): AST, ALT, ALKPHOS, BILITOT, PROT, ALBUMIN in the last 168 hours. No results for input(s): LIPASE, AMYLASE in the last 168 hours. No results for input(s): AMMONIA in the last 168 hours. Coagulation Profile: No results for input(s): INR, PROTIME in the last 168 hours. Cardiac Enzymes: No results for input(s): CKTOTAL, CKMB, CKMBINDEX, TROPONINI in the last 168 hours. BNP (last 3 results) No results for input(s): PROBNP in the last 8760 hours. HbA1C: No results for input(s): HGBA1C in the last 72 hours. CBG: Recent Labs  Lab 09/25/20 0739 09/25/20 1127 09/25/20 1654 09/25/20 2108 09/26/20 0734  GLUCAP 71 292* 217* 150* 175*   Lipid Profile: No results for input(s): CHOL, HDL, LDLCALC, TRIG, CHOLHDL, LDLDIRECT in the last 72 hours. Thyroid Function Tests: No results for input(s): TSH, T4TOTAL, FREET4, T3FREE, THYROIDAB in the last 72 hours. Anemia Panel: No results for input(s): VITAMINB12, FOLATE, FERRITIN, TIBC, IRON, RETICCTPCT in the last 72 hours. Sepsis Labs: No results for input(s): PROCALCITON, LATICACIDVEN in the last 168 hours.  No results found for this or any previous visit (from the past 240 hour(s)).    Radiology Studies: No results  found.    Scheduled Meds: . apixaban  5 mg Oral BID  . chlorhexidine  15 mL Mouth Rinse BID  . Chlorhexidine Gluconate Cloth  6 each Topical Daily  . collagenase   Topical BID  . docusate sodium  100 mg Oral BID  . feeding supplement  237 mL Oral TID BM  . hydrocortisone cream   Topical BID  . insulin aspart  0-15 Units Subcutaneous TID WC  . insulin aspart  0-5 Units Subcutaneous QHS  . insulin aspart  15 Units Subcutaneous TID WC  . insulin glargine  38 Units Subcutaneous BID  . linagliptin  5 mg Oral Daily  . mouth rinse  15 mL Mouth Rinse q12n4p  . melatonin  3 mg Oral QHS  . pantoprazole  40 mg Oral Daily  . predniSONE  30 mg Oral Q breakfast  . tamsulosin  0.4 mg Oral Daily   Continuous Infusions: . sodium chloride       LOS: 38 days      Time spent: 20 minutes   Dessa Phi, DO [facility] 09/26/2020, 10:12 AM   Available via Epic secure chat 7am-7pm After these hours, please refer to coverage provider listed on [email or web address]

## 2020-09-26 NOTE — Progress Notes (Signed)
Inpatient Rehabilitation Admissions Coordinator  I have received a denial with the appeal for Cir admit. I have contacted pt's wife at his bedside and she is aware. She is requesting SNF. I have contacted [place], SW, and acute team. We will sign off at this time.  Danne Baxter, RN, MSN Rehab Admissions Coordinator [phone] 09/26/2020 4:45 PM

## 2020-09-26 NOTE — Progress Notes (Signed)
Nutrition Follow-up  DOCUMENTATION CODES:   Severe malnutrition in context of acute illness/injury  INTERVENTION:    Continue Ensure Enlive po TID, each supplement provides 350 kcal and 20 grams of protein  Continue Magic cup TID with meals, each supplement provides 290 kcal and 9 grams of protein  Encourage intake of meals and supplements  NUTRITION DIAGNOSIS:   Severe Malnutrition related to acute illness (COVID19) as evidenced by mild fat depletion, mild muscle depletion, moderate muscle depletion, severe muscle depletion, moderate fat depletion, percent weight loss (13.3% weight loss in less than 1 month).  Ongoing  GOAL:   Patient will meet greater than or equal to 90% of their needs  Progressing  MONITOR:   TF tolerance, PO intake, Labs, Skin  REASON FOR ASSESSMENT:   Consult, Ventilator Enteral/tube feeding initiation and management  ASSESSMENT:   69 yo male admitted with recent COVID-19 diagnosis with worsening respiratory failure and left pneumothorax.  PMH includes DM, HTN, GERD.  PO intake recorded at 100% of all meals. Ensure Enlive supplements are being offered TID. Patient is drinking three to four supplements per day.  Wife states that he is on prednisone and has an excellent appetite. She has been helping patient order his meals.  Required BiPAP last night. Currently on nasal cannula.  Labs reviewed.  CBG: 175 this morning, range 71-292 on 10/25  Medications reviewed and include Colace, Novolog, Lantus, Tradjenta, prednisone, Flomax.  Weight trending up slightly over the past week. Currently 86.2 kg  Diet Order:   Diet Order            Diet Carb Modified Fluid consistency: Thin; Room service appropriate? Yes  Diet effective now                 EDUCATION NEEDS:   Not appropriate for education at this time  Skin:  Skin Assessment: Skin Integrity Issues: Skin Integrity Issues:: Stage II Stage I: N/A Stage II: bilateral  buttocks  Last BM:  10/24  Height:   Ht Readings from Last 1 Encounters:  08/19/20 5\' 10"  (1.778 m)    Weight:   Wt Readings from Last 1 Encounters:  09/26/20 86.2 kg    BMI:  Body mass index is 27.26 kg/m.  Estimated Nutritional Needs:   Kcal:  2200-2400  Protein:  110-130 gm  Fluid:  2-2.2 L    Lucas Mallow, RD, LDN, CNSC Please refer to Amion for contact information.

## 2020-09-27 LAB — GLUCOSE, CAPILLARY
Glucose-Capillary: 79 mg/dL (ref 70–99)
Glucose-Capillary: 180 mg/dL — ABNORMAL HIGH (ref 70–99)
Glucose-Capillary: 204 mg/dL — ABNORMAL HIGH (ref 70–99)
Glucose-Capillary: 109 mg/dL — ABNORMAL HIGH (ref 70–99)

## 2020-09-27 NOTE — TOC Progression Note (Addendum)
Transition of Care [facility]) - Progression Note    Patient Details  Name: Tim Day MRN: [id number] Date of Birth: [birth year]  Transition of [facility] CM/SW [place], [place] Phone Number: 09/27/2020, 2:13 PM  Clinical Narrative:      CSW spoke with patients spouse Inez Catalina who requested for CSW to fax out initial referral to [facility]. CSW faxed out initial referral for possible SNF placement. CSW called Melissa who reviewed referral and confirmed she can accept patient for SNF placement. Melissa with [employer] started insurance authorization.   Patient has SNF bed at [facility]. Insurance authorization is pending.  CSW will continue to follow.  Expected Discharge Plan: [place] Barriers to Discharge: Continued Medical Work up  Expected Discharge Plan and Services Expected Discharge Plan: [place] In-house Referral: Clinical Social Work Discharge Planning Services: CM Consult   Living arrangements for the past 2 months: Single Family Home                                       Social Determinants of Health (SDOH) Interventions    Readmission Risk Interventions No flowsheet data found.

## 2020-09-27 NOTE — Progress Notes (Signed)
Occupational Therapy Treatment Patient Details Name: Tim Day MRN: [id number] DOB: [birth year] Today's Date: 09/27/2020    History of present illness 69 year old with hx of recent COVID pneumonia diagnosis (9/7-9/13, discharge on 2L O2), BL COVID induced DVTs, DM2, GERD HTN presenting with worsening SOB. Per chart found to have left pneumothorax and worsening bilateral infiltrates. Intubated 9/19-9/20, Chest tube removed 9/28.    OT comments   Pt progressing with bed mobility modA overall; sitting at EOB with BUEs supported; modA for sit to stand and transfers using. RW. Pt set-upA for shaving and encouraged to use mirror. Pt transferring to [facility] and totalA for pericare. Pt requires cues to stand upright ~1 minute. Pt steps to recliner with cues to continue as pt wanting to sit prematurely. Pt appears to be self limited. Pt would benefit from continued OT skilled services. OT following acutely.    Follow Up Recommendations  SNF;Supervision/Assistance - 24 hour    Equipment Recommendations  3 in 1 bedside commode;Wheelchair (measurements OT);Wheelchair cushion (measurements OT) (adult)    Recommendations for Other Services      Precautions / Restrictions Precautions Precautions: Fall Precaution Comments: High RR, watch sats Restrictions Weight Bearing Restrictions: No       Mobility Bed Mobility Overal bed mobility: Needs Assistance Bed Mobility: Rolling;Sidelying to Sit Rolling: Min assist Sidelying to sit: Mod assist;HOB elevated       General bed mobility comments: Assist to elevate trunk into sitting; cues to scoot to EOB  Transfers Overall transfer level: Needs assistance Equipment used: Rolling walker (2 wheeled) Transfers: Sit to/from [facility] Sit to Stand: Mod assist;From elevated surface Stand pivot transfers: Mod assist;From elevated surface       General transfer comment: cues for hand placement and "nose over toes" to  increase sit to stand volume.    Balance Overall balance assessment: Needs assistance Sitting-balance support: Bilateral upper extremity supported;Feet supported Sitting balance-Leahy Scale: Poor Sitting balance - Comments: BUE supported   Standing balance support: Bilateral upper extremity supported;During functional activity Standing balance-Leahy Scale: Poor                             ADL either performed or assessed with clinical judgement   ADL Overall ADL's : Needs assistance/impaired     Grooming: Set up;Sitting Grooming Details (indicate cue type and reason): Pt shaving face in sitting; O2 86% with exertion on 2LO2 and quickly resumes >90% O2.                 Toilet Transfer: Moderate assistance;Stand-pivot;Cueing for safety;BSC;RW Toilet Transfer Details (indicate cue type and reason): cues for motivation and requires assist to reduce posterior lean. Toileting- Clothing Manipulation and Hygiene: Total assistance;Sit to/from stand Toileting - Clothing Manipulation Details (indicate cue type and reason): able to stand  ~1 minute     Functional mobility during ADLs: Min guard;+2 for physical assistance;+2 for safety/equipment General ADL Comments: Cues for energy conservation; motivational cues to continue to perform tasks as pt often stating "I am going backwards or can I sit down yet?"      Vision   Vision Assessment?: No apparent visual deficits   Perception     Praxis      Cognition Arousal/Alertness: Awake/alert Behavior During Therapy: Anxious Overall Cognitive Status: Impaired/Different from baseline Area of Impairment: Safety/judgement;Problem solving  Safety/Judgement: Decreased awareness of safety   Problem Solving: Slow processing;Difficulty sequencing;Requires verbal cues;Requires tactile cues General Comments: Cues to reduce anxiety, decrease in sequencing        Exercises     Shoulder  Instructions       General Comments 2L O2  >90% with cues for pursed lip breathing at rest; 86% O2 on 2L with exertion, quickly recovers and even better after pt blowing his nose.     Pertinent Vitals/ Pain       Pain Assessment: No/denies pain  Home Living                                          Prior Functioning/Environment              Frequency  Min 2X/week        Progress Toward Goals  OT Goals(current goals can now be found in the care plan section)  Progress towards OT goals: Progressing toward goals  Acute Rehab OT Goals Patient Stated Goal: to get better OT Goal Formulation: With patient/family Time For Goal Achievement: 10/11/20 Potential to Achieve Goals: Good ADL Goals Pt Will Perform Grooming: with supervision;sitting Pt Will Perform Upper Body Bathing: with supervision;sitting Pt Will Perform Lower Body Bathing: with modified independence Pt Will Perform Upper Body Dressing: with supervision;sitting Pt Will Transfer to Toilet: with min assist;stand pivot transfer;bedside commode Pt/caregiver will Perform Home Exercise Program: Increased strength;Both right and left upper extremity;With written HEP provided;With theraband Additional ADL Goal #1: pt will verbalize and demonstrate two energy conservation techniques  Plan Discharge plan remains appropriate    Co-evaluation                 AM-PAC OT "6 Clicks" Daily Activity     Outcome Measure   Help from another person eating meals?: A Little Help from another person taking care of personal grooming?: A Little Help from another person toileting, which includes using toliet, bedpan, or urinal?: A Lot Help from another person bathing (including washing, rinsing, drying)?: A Lot Help from another person to put on and taking off regular upper body clothing?: A Lot Help from another person to put on and taking off regular lower body clothing?: A Lot 6 Click Score: 14    End  of Session Equipment Utilized During Treatment: Gait belt;Rolling walker;Oxygen  OT Visit Diagnosis: Unsteadiness on feet (R26.81);Other symptoms and signs involving cognitive function;Muscle weakness (generalized) (M62.81)   Activity Tolerance Patient limited by fatigue   Patient Left in chair;with call bell/phone within reach;with nursing/sitter in room;with family/visitor present   Nurse Communication Mobility status        Time: 3614-4315 OT Time Calculation (min): 35 min  Charges: OT General Charges $OT Visit: 1 Visit OT Treatments $Self Care/Home Management : 8-22 mins $Therapeutic Activity: 8-22 mins  Jefferey Pica, OTR/L Acute Rehabilitation Services Pager: [phone] Office: [phone]    Jazon Jipson C 09/27/2020, 3:40 PM

## 2020-09-27 NOTE — Progress Notes (Signed)
Physical Therapy Treatment Patient Details Name: Tim Day MRN: [id number] DOB: 1951/11/17 Today's Date: 09/27/2020    History of Present Illness 69 year old with hx of recent COVID pneumonia diagnosis (9/7-9/13, discharge on 2L O2), BL COVID induced DVTs, DM2, GERD HTN presenting with worsening SOB. Per chart found to have left pneumothorax and worsening bilateral infiltrates. Intubated 9/19-9/20, Chest tube removed 9/28.     PT Comments    Pt progressing slowly towards his physical therapy goals. Ambulating 10 feet with moderate assist and chair follow. SpO2 97% on 2L O2, HR 106-150 bpm. Rest of session focused on bed level exercises. Continues with significant deconditioning/debility, balance deficits and decreased endurance. Recommending post acute rehab to address deficits, maximize functional mobility and decrease caregiver burden.    Follow Up Recommendations  CIR     Equipment Recommendations  Rolling walker with 5" wheels;Wheelchair (measurements PT);3in1 (PT);Wheelchair cushion (measurements PT)   Recommendations for Other Services       Precautions / Restrictions Precautions Precautions: Fall Precaution Comments: High RR, watch sats Restrictions Weight Bearing Restrictions: No    Mobility  Bed Mobility Overal bed mobility: Needs Assistance Bed Mobility: Sit to Supine Rolling: Min assist Sidelying to sit: Mod assist;HOB elevated   Sit to supine: Mod assist   General bed mobility comments: ModA for BLE negotiation back into bed  Transfers Overall transfer level: Needs assistance Equipment used: Rolling walker (2 wheeled) Transfers: Sit to/from Stand Sit to Stand: Mod assist;+2 safety/equipment Stand pivot transfers: Mod assist;From elevated surface       General transfer comment: ModA to rise to standing from recliner, cues for rocking forward to gain momentum, hand placement  Ambulation/Gait Ambulation/Gait assistance: Mod assist;+2  safety/equipment Gait Distance (Feet): 10 Feet Assistive device: Rolling walker (2 wheeled) Gait Pattern/deviations: Shuffle;Narrow base of support;Decreased stride length;Step-through pattern Gait velocity: decreased Gait velocity interpretation: <1.31 ft/sec, indicative of household ambulator General Gait Details: ModA for stability, +2 safety (chair follow utilized), bilateral knee instability and narrow BOS. Cues for sequencing/direction   Stairs             Wheelchair Mobility    Modified Rankin (Stroke Patients Only)       Balance Overall balance assessment: Needs assistance Sitting-balance support: Bilateral upper extremity supported;Feet supported Sitting balance-Leahy Scale: Poor Sitting balance - Comments: BUE supported   Standing balance support: Bilateral upper extremity supported;During functional activity Standing balance-Leahy Scale: Poor                              Cognition Arousal/Alertness: Awake/alert Behavior During Therapy: Anxious Overall Cognitive Status: Impaired/Different from baseline Area of Impairment: Safety/judgement;Problem solving                         Safety/Judgement: Decreased awareness of safety   Problem Solving: Slow processing;Difficulty sequencing;Requires verbal cues;Requires tactile cues General Comments: cues for problem solving      Exercises General Exercises - Lower Extremity Quad Sets: Both;10 reps;Supine Heel Slides: Both;10 reps;Supine Straight Leg Raises: Both;5 reps;Supine    General Comments General comments (skin integrity, edema, etc.): 2L O2  >90% with cues for pursed lip breathing at rest; 86% O2 on 2L with exertion, quickly recovers and even better after pt blowing his nose.       Pertinent Vitals/Pain Pain Assessment: Faces Faces Pain Scale: Hurts a little bit Pain Location: generalized Pain Descriptors / Indicators: Discomfort;Aching Pain Intervention(s):  Monitored during  session;Limited activity within patient's tolerance    Home Living                      Prior Function            PT Goals (current goals can now be found in the care plan section) Acute Rehab PT Goals Patient Stated Goal: to get better PT Goal Formulation: With patient/family Time For Goal Achievement: 10/11/20 Potential to Achieve Goals: Good Progress towards PT goals: Progressing toward goals    Frequency    Min 3X/week      PT Plan Current plan remains appropriate    Co-evaluation              AM-PAC PT "6 Clicks" Mobility   Outcome Measure  Help needed turning from your back to your side while in a flat bed without using bedrails?: A Little Help needed moving from lying on your back to sitting on the side of a flat bed without using bedrails?: A Lot Help needed moving to and from a bed to a chair (including a wheelchair)?: A Lot Help needed standing up from a chair using your arms (e.g., wheelchair or bedside chair)?: A Lot Help needed to walk in hospital room?: A Lot Help needed climbing 3-5 steps with a railing? : Total 6 Click Score: 12    End of Session Equipment Utilized During Treatment: Gait belt;Oxygen Activity Tolerance: Patient tolerated treatment well Patient left: in bed;with call bell/phone within reach;with family/visitor present Nurse Communication: Mobility status PT Visit Diagnosis: Other abnormalities of gait and mobility (R26.89);Muscle weakness (generalized) (M62.81);Difficulty in walking, not elsewhere classified (R26.2)     Time: 1315-1340 PT Time Calculation (min) (ACUTE ONLY): 25 min  Charges:  $Therapeutic Activity: 23-37 mins                     Wyona Almas, PT, DPT Acute Rehabilitation Services Pager [phone] Office [phone]    Tim Day 09/27/2020, 3:49 PM

## 2020-09-27 NOTE — NC FL2 (Signed)
[place] LEVEL OF CARE SCREENING TOOL     IDENTIFICATION  Patient Name: Tim Day Birthdate: [birth year] Sex: male Admission Date (Current Location): 08/19/2020  [facility] and [place] Number:  Herbalist and Address:  [facility] [place] [address], [place], [place] [address]      Provider Number: [id number]  Attending Physician Name and Address:  Oswald Hillock, MD  Relative Name and Phone Number:  Inez Catalina [phone]    Current Level of Care: Hospital Recommended Level of Care: [place] Prior Approval Number:    Date Approved/Denied:   PASRR Number: [id number]  Discharge Plan: SNF    Current Diagnoses: Patient Active Problem List   Diagnosis Date Noted  . Adjustment disorder with anxious mood 09/15/2020  . Protein-calorie malnutrition, severe 08/25/2020  . Primary spontaneous pneumothorax   . HCAP (healthcare-associated pneumonia)   . Pressure injury of skin 08/20/2020  . Acute respiratory failure with hypoxia ([place])   . Pneumothorax, left 08/19/2020  . Pneumonia due to COVID-19 virus 08/19/2020  . Leg DVT (deep venous thromboembolism), acute, bilateral ([place]) 08/19/2020  . Acute hypoxemic respiratory failure due to COVID-19 ([place]) 08/08/2020  . Sepsis ([place]) 08/08/2020  . Hyponatremia 08/08/2020  . Hypocalcemia 08/03/2020  . Transaminitis 08/03/2020  . COVID-19 virus infection 08/02/2020  . Thrombocytopenia ([place]) 08/02/2020  . Leukopenia 08/02/2020  . Esophageal reflux   . Chest tube in place 01/22/2015  . Hypercholesteremia   . Hypertension   . Cardiogenic shock ([place]) 01/20/2015  . DM2 (diabetes mellitus, type 2) ([place]) 01/20/2015  . Benign essential HTN 01/20/2015  . Dyslipidemia 01/20/2015    Orientation RESPIRATION BLADDER Height & Weight     Self, Time, Situation, Place  O2 (HFNC 3 liters) Incontinent (Urethral Catheter non-latex 16 Fr.) Weight: 188 lb (85.3 kg) Height:  5\' 10"   (177.8 cm)  BEHAVIORAL SYMPTOMS/MOOD NEUROLOGICAL BOWEL NUTRITION STATUS      Continent Diet (See Discharge Summary)  AMBULATORY STATUS COMMUNICATION OF NEEDS Skin   Limited Assist Verbally Other (Comment) (Approp for ethnicity,dry,PI buttocks bilateral stage 2 partial thinkness loss of dermis presenting as a shallow open injury with a red pink wound bed without slough,foam lift dressing, dry,clean,pink)                       Personal Care Assistance Level of Assistance  Bathing, Feeding, Dressing Bathing Assistance: Maximum assistance Feeding assistance: Independent (able to feed self;Carb modified) Dressing Assistance: Maximum assistance     Functional Limitations Info  Sight, Hearing, Speech Sight Info: Adequate Hearing Info: Adequate Speech Info: Adequate    SPECIAL CARE FACTORS FREQUENCY  PT (By licensed PT), OT (By licensed OT)     PT Frequency: 5x min weekly OT Frequency: 5x min weekly            Contractures Contractures Info: Not present    Additional Factors Info  Code Status, Allergies, Insulin Sliding Scale Code Status Info: FULL Allergies Info: Eggs Or Egg-derived Products   Insulin Sliding Scale Info: insulin aspart (novoLOG) injection 0-15 Units 3 times daily with meals,insulin aspart (novoLOG) injection 0-5 Units daily at bedtime,insulin aspart (novoLOG) injection 15 Units 3 times daily with mealsinsulin glargine (LANTUS) injection 38 Units 2 times daily ,       Current Medications (09/27/2020):  This is the current hospital active medication list Current Facility-Administered Medications  Medication Dose Route Frequency Provider Last Rate Last Admin  . 0.9 %  sodium chloride infusion  250 mL Intravenous Continuous Gleason, Otilio Carpen, PA-C      . acetaminophen (TYLENOL) suppository 650 mg  650 mg Rectal Q6H PRN Para Skeans, MD   650 mg at 09/17/20 1826   Or  . acetaminophen (TYLENOL) tablet 650 mg  650 mg Oral Q6H PRN Para Skeans, MD   650 mg at  09/26/20 2341  . albuterol (VENTOLIN HFA) 108 (90 Base) MCG/ACT inhaler 2 puff  2 puff Inhalation Q6H PRN Dessa Phi, DO      . ALPRAZolam Duanne Moron) tablet 0.25 mg  0.25 mg Oral TID PRN Darliss Cheney, MD   0.25 mg at 09/27/20 1053  . alum & mag hydroxide-simeth (MAALOX/MYLANTA) 200-200-20 MG/5ML suspension 30 mL  30 mL Oral Q4H PRN Kathie Dike, MD   30 mL at 09/24/20 2150  . apixaban (ELIQUIS) tablet 5 mg  5 mg Oral BID Einar Grad, RPH   5 mg at 09/27/20 1053  . chlorhexidine (PERIDEX) 0.12 % solution 15 mL  15 mL Mouth Rinse BID Jacky Kindle, MD   15 mL at 09/27/20 1053  . Chlorhexidine Gluconate Cloth 2 % PADS 6 each  6 each Topical Daily Bunnie Pion Z, DO   6 each at 09/27/20 1054  . collagenase (SANTYL) ointment   Topical BID Vashti Hey, MD   Given at 09/27/20 1054  . docusate sodium (COLACE) capsule 100 mg  100 mg Oral BID Efraim Kaufmann, RPH   100 mg at 09/27/20 1053  . feeding supplement (ENSURE ENLIVE) (ENSURE ENLIVE) liquid 237 mL  237 mL Oral TID BM Bunnie Pion Z, DO   237 mL at 09/27/20 1054  . Gerhardt's butt cream   Topical PRN Antonieta Pert, MD   Given at 09/13/20 0855  . hydrocortisone cream 1 %   Topical BID Bunnie Pion Z, DO   Given at 09/27/20 1054  . insulin aspart (novoLOG) injection 0-15 Units  0-15 Units Subcutaneous TID WC Kc, Ramesh, MD   3 Units at 09/27/20 1300  . insulin aspart (novoLOG) injection 0-5 Units  0-5 Units Subcutaneous QHS Antonieta Pert, MD   2 Units at 09/26/20 2156  . insulin aspart (novoLOG) injection 15 Units  15 Units Subcutaneous TID WC Barb Merino, MD   15 Units at 09/27/20 1300  . insulin glargine (LANTUS) injection 38 Units  38 Units Subcutaneous BID Darliss Cheney, MD   38 Units at 09/27/20 1055  . linagliptin (TRADJENTA) tablet 5 mg  5 mg Oral Daily Jacky Kindle, MD   5 mg at 09/27/20 1053  . MEDLINE mouth rinse  15 mL Mouth Rinse q12n4p Jacky Kindle, MD   15 mL at 09/27/20 1300  . melatonin tablet 3 mg  3 mg  Oral QHS Florencia Reasons, MD   3 mg at 09/26/20 2038  . ondansetron (ZOFRAN) tablet 4 mg  4 mg Oral Q6H PRN Jacky Kindle, MD       Or  . ondansetron (ZOFRAN) injection 4 mg  4 mg Intravenous Q6H PRN Jacky Kindle, MD      . pantoprazole (PROTONIX) EC tablet 40 mg  40 mg Oral Daily Barb Merino, MD   40 mg at 09/27/20 1055  . phenol (CHLORASEPTIC) mouth spray 1 spray  1 spray Mouth/Throat PRN Antonieta Pert, MD   1 spray at 08/31/20 0105  . pneumococcal 23 valent vaccine (PNEUMOVAX-23) injection 0.5 mL  0.5 mL Intramuscular Prior to discharge Antonieta Pert, MD      .  polyethylene glycol (MIRALAX / GLYCOLAX) packet 17 g  17 g Oral Daily PRN Jacky Kindle, MD   17 g at 09/19/20 0955  . predniSONE (DELTASONE) tablet 30 mg  30 mg Oral Q breakfast Dessa Phi, DO   30 mg at 09/27/20 0835  . sodium chloride (OCEAN) 0.65 % nasal spray 1 spray  1 spray Each Nare PRN Brand Males, MD      . tamsulosin (FLOMAX) capsule 0.4 mg  0.4 mg Oral Daily Simonne Maffucci B, MD   0.4 mg at 09/27/20 1053  . zolpidem (AMBIEN) tablet 10 mg  10 mg Oral QHS PRN Barb Merino, MD   10 mg at 09/26/20 2342     Discharge Medications: Please see discharge summary for a list of discharge medications.  Relevant Imaging Results:  Relevant Lab Results:   Additional Information [phone]  Trula Ore, LCSWA

## 2020-09-27 NOTE — Progress Notes (Signed)
[facility]  PROGRESS NOTE  Tim Day UVO:[id number] DOB: 07-Apr-1951 DOA: 08/19/2020 PCP: Celene Squibb, MD   Brief HPI:   69 year old male with recent Covid pneumonia, 08/08/2020 -  7/42/5956, complicated by bilateral DVTs was admitted 08/19/2020 with left pneumothorax requiring chest tube placement and subsequent intubation 08/20/2020.  He was extubated following day but had persistent bilateral infiltrates requiring high flow nasal cannula 10 to 15 L/min oxygen.  Hospital course complicated by urinary retention, Foley catheter was inserted.  Also had episode of sudden loss of vision in right eye which has now resolved.  Neurology work-up was negative.    Subjective   Patient seen and examined, denies any complaints.  Awaiting insurance authorization for transfer to CIR for rehab.   Assessment/Plan:     1. Acute hypoxemic and hypercarbic respiratory failure-secondary to recent COVID-19 pneumonia, s/p left pneumothorax.  Wean down to 2 L nasal cannula oxygen, continue to wean off as tolerated.  Continue to wean prednisone. 2. Acute urine retention-started on tamsulosin, Foley catheter.  Failed voiding trial x2.  Will again try voiding trial place once patient is more ambulatory.  If again fails voiding trial, urology will see patient as outpatient. 3. Bilateral DVTs-continue Eliquis. 4. Diabetes mellitus type 2-continue Lantus, sliding scale insulin with NovoLog, linagliptin.  CBG well controlled. 5. Depression/anxiety-continue Ativan as needed 6. Sudden unilateral vision loss-resolved, patient had episode of sudden loss of vision right eye on 09/07/2020, he was unable to see anything for few minutes later on his vision came back but it remained blurry for 15 to 20 minutes.  CT head without contrast was negative.  MRI was unremarkable.  Bilateral carotid duplex was unremarkable. 7. Left eye subconjunctival hemorrhage-stable     COVID-19 Labs  No results for input(s):  DDIMER, FERRITIN, LDH, CRP in the last 72 hours.  Lab Results  Component Value Date   SARSCOV2NAA POSITIVE (A) 08/19/2020   SARSCOV2NAA POSITIVE (A) 08/02/2020     Scheduled medications:   . apixaban  5 mg Oral BID  . chlorhexidine  15 mL Mouth Rinse BID  . Chlorhexidine Gluconate Cloth  6 each Topical Daily  . collagenase   Topical BID  . docusate sodium  100 mg Oral BID  . feeding supplement  237 mL Oral TID BM  . hydrocortisone cream   Topical BID  . insulin aspart  0-15 Units Subcutaneous TID WC  . insulin aspart  0-5 Units Subcutaneous QHS  . insulin aspart  15 Units Subcutaneous TID WC  . insulin glargine  38 Units Subcutaneous BID  . linagliptin  5 mg Oral Daily  . mouth rinse  15 mL Mouth Rinse q12n4p  . melatonin  3 mg Oral QHS  . pantoprazole  40 mg Oral Daily  . predniSONE  30 mg Oral Q breakfast  . tamsulosin  0.4 mg Oral Daily         CBG: Recent Labs  Lab 09/26/20 1557 09/26/20 2141 09/27/20 0740 09/27/20 1145 09/27/20 1645  GLUCAP 225* 227* 79 180* 204*    SpO2: 95 % O2 Flow Rate (L/min): 3 L/min FiO2 (%): 32 %    CBC: Recent Labs  Lab 09/23/20 0306  WBC 8.6  NEUTROABS 5.7  HGB 11.9*  HCT 39.8  MCV 96.6  PLT 387    Basic Metabolic Panel: Recent Labs  Lab 09/23/20 0306  NA 137  K 4.8  CL 94*  CO2 37*  GLUCOSE 137*  BUN 23  CREATININE 0.59*  CALCIUM 8.7*  MG 2.2      Antibiotics: Anti-infectives (From admission, onward)   Start     Dose/Rate Route Frequency Ordered Stop   09/17/20 1730  piperacillin-tazobactam (ZOSYN) IVPB 3.375 g  Status:  Discontinued        3.375 g 12.5 mL/hr over 240 Minutes Intravenous Every 8 hours 09/17/20 1635 09/17/20 1928   09/03/20 2100  cefTRIAXone (ROCEPHIN) 2 g in sodium chloride 0.9 % 100 mL IVPB  Status:  Discontinued        2 g 200 mL/hr over 30 Minutes Intravenous Every 24 hours 09/03/20 1802 09/04/20 1443   08/31/20 2230  sulfamethoxazole-trimethoprim (BACTRIM DS) 800-160 MG per  tablet 1 tablet  Status:  Discontinued        1 tablet Oral Every 12 hours 08/31/20 2201 09/06/20 1548   08/19/20 2000  piperacillin-tazobactam (ZOSYN) IVPB 3.375 g        3.375 g 12.5 mL/hr over 240 Minutes Intravenous Every 8 hours 08/19/20 1850 08/23/20 2359   08/19/20 1845  piperacillin-tazobactam (ZOSYN) IVPB 3.375 g  Status:  Discontinued        3.375 g 100 mL/hr over 30 Minutes Intravenous  Once 08/19/20 1838 08/19/20 1850   08/19/20 0900  vancomycin (VANCOREADY) IVPB 1500 mg/300 mL        1,500 mg 150 mL/hr over 120 Minutes Intravenous  Once 08/19/20 0831 08/19/20 1303   08/19/20 0845  piperacillin-tazobactam (ZOSYN) IVPB 3.375 g        3.375 g 100 mL/hr over 30 Minutes Intravenous  Once 08/19/20 0831 08/19/20 1103       DVT prophylaxis: Eliquis  Code Status: Full code  Family Communication: Discussed with patient wife at bedside   Consultants:    Procedures:      Objective   Vitals:   09/27/20 0013 09/27/20 0621 09/27/20 0832 09/27/20 1148  BP: 126/84 124/77 128/75 138/82  Pulse: 87 87 95 (!) 101  Resp: (!) 25 (!) 25 (!) 25 (!) 26  Temp: 98.5 F (36.9 C) 98.4 F (36.9 C) 98 F (36.7 C) 98.4 F (36.9 C)  TempSrc: Oral Oral Oral Oral  SpO2: 99% 99% 99% 95%  Weight:  85.3 kg    Height:        Intake/Output Summary (Last 24 hours) at 09/27/2020 1711 Last data filed at 09/27/2020 1019 Gross per 24 hour  Intake 240 ml  Output 2625 ml  Net -2385 ml    10/25 1901 - 10/27 0700 In: 500 [P.O.:500] Out: 6750 [Urine:6750]  Filed Weights   09/25/20 0400 09/26/20 0700 09/27/20 3295  Weight: 85.3 kg 86.2 kg 85.3 kg    Physical Examination:   General-appears in no acute distress Heart-S1-S2, regular, no murmur auscultated Lungs-clear to auscultation bilaterally, no wheezing or crackles auscultated Abdomen-soft, nontender, no organomegaly Extremities-no edema in the lower extremities Neuro-alert, oriented x3, no focal deficit noted  Status is:  Inpatient  Dispo: The patient is from: Home              Anticipated d/c is to: CIR              Anticipated d/c date is: 09/29/2020              Patient currently medically stable for discharge  Barrier to discharge-awaiting insurance authorization for CIR  Pressure Injury 08/19/20 Buttocks Bilateral Stage 2 -  Partial thickness loss of dermis presenting as a shallow open injury with a red, pink wound bed without slough. (  Active)  08/19/20 1853  Location: Buttocks  Location Orientation: Bilateral  Staging: Stage 2 -  Partial thickness loss of dermis presenting as a shallow open injury with a red, pink wound bed without slough.  Wound Description (Comments):   Present on Admission: Yes     Data Reviewed:   No results found for this or any previous visit (from the past 240 hour(s)).  No results for input(s): LIPASE, AMYLASE in the last 168 hours. No results for input(s): AMMONIA in the last 168 hours.  Cardiac Enzymes: No results for input(s): CKTOTAL, CKMB, CKMBINDEX, TROPONINI in the last 168 hours. BNP (last 3 results) Recent Labs    08/08/20 1616 08/19/20 0730  BNP 68.3 111.0*    ProBNP (last 3 results) No results for input(s): PROBNP in the last 8760 hours.  Studies:  No results found.     Oswald Hillock   [facility] If 7PM-7AM, please contact night-coverage at [email or web address], Office  [phone]   09/27/2020, 5:11 PM  LOS: 39 days

## 2020-09-28 ENCOUNTER — Inpatient Hospital Stay: Payer: 59

## 2020-09-28 LAB — GLUCOSE, CAPILLARY
Glucose-Capillary: 135 mg/dL — ABNORMAL HIGH (ref 70–99)
Glucose-Capillary: 249 mg/dL — ABNORMAL HIGH (ref 70–99)
Glucose-Capillary: 251 mg/dL — ABNORMAL HIGH (ref 70–99)
Glucose-Capillary: 280 mg/dL — ABNORMAL HIGH (ref 70–99)

## 2020-09-28 MED ORDER — INFLUENZA VAC A&B SA ADJ QUAD 0.5 ML IM PRSY
0.5000 mL | PREFILLED_SYRINGE | INTRAMUSCULAR | Status: DC
  Filled 2020-09-28: qty 0.5

## 2020-09-28 MED ORDER — ONDANSETRON HCL 4 MG PO TABS: 4.0000 mg | ORAL_TABLET | Freq: Four times a day (QID) | ORAL | 0 refills | Status: DC | PRN

## 2020-09-28 MED ORDER — CHLORHEXIDINE GLUCONATE 0.12 % MT SOLN: 15.0000 mL | Freq: Two times a day (BID) | OROMUCOSAL | 0 refills | Status: DC

## 2020-09-28 MED ORDER — INSULIN GLARGINE 100 UNIT/ML ~~LOC~~ SOLN: 38.0000 [IU] | Freq: Two times a day (BID) | SUBCUTANEOUS | 11 refills | Status: DC

## 2020-09-28 MED ORDER — ALPRAZOLAM 0.25 MG PO TABS: 0.2500 mg | ORAL_TABLET | Freq: Three times a day (TID) | ORAL | 0 refills | Status: DC | PRN

## 2020-09-28 MED ORDER — PREDNISONE 10 MG PO TABS
ORAL_TABLET | ORAL | Status: DC
Start: 2020-09-28 — End: 2020-12-27

## 2020-09-28 MED ORDER — TAMSULOSIN HCL 0.4 MG PO CAPS
0.4000 mg | ORAL_CAPSULE | Freq: Every day | ORAL | Status: DC
Start: 2020-09-29 — End: 2021-03-13

## 2020-09-28 MED ORDER — ALBUTEROL SULFATE HFA 108 (90 BASE) MCG/ACT IN AERS: 2.0000 | INHALATION_SPRAY | Freq: Four times a day (QID) | RESPIRATORY_TRACT | Status: DC | PRN

## 2020-09-28 MED ORDER — INFLUENZA VAC A&B SA ADJ QUAD 0.5 ML IM PRSY
0.5000 mL | PREFILLED_SYRINGE | Freq: Once | INTRAMUSCULAR | Status: AC
  Administered 2020-09-28: 0.5 mL via INTRAMUSCULAR
  Filled 2020-09-28: qty 0.5

## 2020-09-28 MED ORDER — ALUM & MAG HYDROXIDE-SIMETH 200-200-20 MG/5ML PO SUSP: 30.0000 mL | ORAL | 0 refills | Status: DC | PRN

## 2020-09-28 MED ORDER — LINAGLIPTIN 5 MG PO TABS
5.0000 mg | ORAL_TABLET | Freq: Every day | ORAL | Status: DC
Start: 2020-09-29 — End: 2020-12-27

## 2020-09-28 MED ORDER — INSULIN ASPART 100 UNIT/ML ~~LOC~~ SOLN
0.0000 [IU] | Freq: Three times a day (TID) | SUBCUTANEOUS | 11 refills | Status: DC
Start: 2020-09-28 — End: 2021-06-19

## 2020-09-28 MED ORDER — PANTOPRAZOLE SODIUM 40 MG PO TBEC
40.0000 mg | DELAYED_RELEASE_TABLET | Freq: Every day | ORAL | Status: DC
Start: 2020-09-29 — End: 2021-03-13

## 2020-09-28 MED ORDER — POLYETHYLENE GLYCOL 3350 17 G PO PACK: 17.0000 g | PACK | Freq: Every day | ORAL | 0 refills | Status: DC | PRN

## 2020-09-28 MED ORDER — COLLAGENASE 250 UNIT/GM EX OINT
TOPICAL_OINTMENT | Freq: Two times a day (BID) | CUTANEOUS | 0 refills | Status: DC
Start: 2020-09-28 — End: 2020-12-27

## 2020-09-28 MED ORDER — DOCUSATE SODIUM 100 MG PO CAPS
100.0000 mg | ORAL_CAPSULE | Freq: Two times a day (BID) | ORAL | 0 refills | Status: DC
Start: 2020-09-28 — End: 2020-12-27

## 2020-09-28 NOTE — Progress Notes (Signed)
Report called to Lonn Georgia, Therapist, sports at [facility] for transfer via [employer].

## 2020-09-28 NOTE — TOC Transition Note (Signed)
Transition of Care [facility]) - CM/SW Discharge Note   Patient Details  Name: Tim Day MRN: [id number] Date of Birth: December 03, 1950  [facility] CM/SW Contact:  Trula Ore, [place] Phone Number: 09/28/2020, 2:15 PM   Clinical Narrative:      Patient will DC to: [facility]   Anticipated DC date: 09/28/2020  Family notified: Inez Catalina  Transport by: Corey Harold  ?  Per MD patient ready for DC to [facility] . RN, patient, patient's family, and facility notified of DC. Discharge Summary sent to facility. RN given number for report [phone]. DC packet on chart. Ambulance transport requested for patient.  CSW signing off.  Final next level of care: Skilled Nursing Facility Barriers to Discharge: No Barriers Identified   Patient Goals and CMS Choice Patient states their goals for this hospitalization and ongoing recovery are:: to go to SNF CMS Medicare.gov Compare Post Acute Care list provided to:: Patient Represenative (must comment) (spouse betty) Choice offered to / list presented to : Spouse Inez Catalina)  Discharge Placement              Patient chooses bed at: [facility], [facility], [facility] Patient to be transferred to facility by: [place] Name of family member notified: Inez Catalina Patient and family notified of of transfer: 09/28/20  Discharge Plan and Services In-house Referral: Clinical Social Work Discharge Planning Services: CM Consult                                 Social Determinants of Health ([place]) Interventions     Readmission Risk Interventions No flowsheet data found.

## 2020-09-28 NOTE — Discharge Summary (Signed)
Physician Discharge Summary  Tim Day [name] DOB: 05/04/1951 DOA: 08/19/2020  PCP: Celene Squibb, MD  Admit date: 08/19/2020 Discharge date: 09/28/2020  Time spent: 50 minutes  Recommendations for Outpatient Follow-up:  1. Follow-up urology in 2 weeks for voiding trial 2. Patient will be discharged with Foley catheter 3. Continue oxygen 2 to 3 L/min via nasal cannula, wean off as tolerated 4. Follow-up with ophthalmology as outpatient  Discharge Diagnoses:  Active Problems:   DM2 (diabetes mellitus, type 2) ([place])   Benign essential HTN   Dyslipidemia   Chest tube in place   COVID-19 virus infection   Acute hypoxemic respiratory failure due to COVID-19 [facility])   Pneumothorax, left   Pneumonia due to COVID-19 virus   Leg DVT (deep venous thromboembolism), acute, bilateral ([facility])   Pressure injury of skin   Acute respiratory failure with hypoxia ([place])   HCAP (healthcare-associated pneumonia)   Primary spontaneous pneumothorax   Protein-calorie malnutrition, severe   Adjustment disorder with anxious mood   Discharge Condition: Stable  Diet recommendation: Carb modified diet  Filed Weights   09/25/20 0400 09/26/20 0700 09/27/20 3818  Weight: 85.3 kg 86.2 kg 85.3 kg    History of present illness:  69 year old male with recent Covid pneumonia, 08/08/2020 -  2/99/3716, complicated by bilateral DVTs was admitted 08/19/2020 with left pneumothorax requiring chest tube placement and subsequent intubation 08/20/2020.  He was extubated following day but had persistent bilateral infiltrates requiring high flow nasal cannula 10 to 15 L/min oxygen.  Hospital course complicated by urinary retention, Foley catheter was inserted.  Also had episode of sudden loss of vision in right eye which has now resolved.  Neurology work-up was negative.  Hospital Course:  1. Acute hypoxemic and hypercarbic respiratory failure-secondary to recent COVID-19 pneumonia, s/p left pneumothorax.   Wean down to 2 L nasal cannula oxygen, continue to wean off as tolerated.  Continue to wean prednisone.  Will discharge on prednisone taper for 5 more days.  Continue as needed albuterol inhaler. 2. Acute urine retention-started on tamsulosin, Foley catheter.  Failed voiding trial x2.    Urology recommends to discharge patient with Foley catheter. Urology will see patient as outpatient. 3. Bilateral DVTs- continue Eliquis. 4. Diabetes mellitus type 2-continue Lantus, sliding scale insulin with NovoLog, linagliptin.  CBG well controlled. 5. Depression/anxiety-continue Ativan as needed 6. Sudden unilateral vision loss-resolved, patient had episode of sudden loss of vision right eye on 09/07/2020, he was unable to see anything for few seconds only, later on his vision came back but it remained blurry for 15 to 20 minutes.  CT head without contrast was negative.  MRI was unremarkable.  Bilateral carotid duplex was unremarkable.  Discussed with patient and his wife, he needs to follow-up with ophthalmology as outpatient. 7. Left eye subconjunctival hemorrhage-stable  Procedures:  Psychiatry  Cardiology  Consultations:  Echocardiogram  Discharge Exam: Vitals:   09/28/20 0815 09/28/20 1254  BP: 117/72 126/72  Pulse: 97 (!) 102  Resp: (!) 25 (!) 30  Temp: 97.7 F (36.5 C) 97.9 F (36.6 C)  SpO2: 99% 98%    General: Appears in no acute distress Cardiovascular: S1-S2, regular Respiratory: Clear to auscultation bilaterally  Discharge Instructions   Discharge Instructions    Diet - low sodium heart healthy   Complete by: As directed    Increase activity slowly   Complete by: As directed    No wound care   Complete by: As directed      Allergies  as of 09/28/2020      Reactions   Eggs Or Egg-derived Products Hives      Medication List    STOP taking these medications   amLODipine 10 MG tablet Commonly known as: NORVASC   aspirin EC 81 MG tablet   glipiZIDE 5 MG  tablet Commonly known as: GLUCOTROL   insulin glargine (1 Unit Dial) 300 UNIT/ML Solostar Pen Commonly known as: TOUJEO Replaced by: insulin glargine 100 UNIT/ML injection   olmesartan-hydrochlorothiazide 40-25 MG tablet Commonly known as: BENICAR HCT   omeprazole 20 MG capsule Commonly known as: PriLOSEC   Trulicity 1.5 DH/7.4BU Sopn Generic drug: Dulaglutide     TAKE these medications   acetaminophen 325 MG tablet Commonly known as: TYLENOL Take 2 tablets (650 mg total) by mouth every 6 (six) hours as needed for mild pain, fever or headache.   albuterol 108 (90 Base) MCG/ACT inhaler Commonly known as: VENTOLIN HFA Inhale 2 puffs into the lungs every 6 (six) hours as needed for wheezing or shortness of breath.   ALPRAZolam 0.25 MG tablet Commonly known as: XANAX Take 1 tablet (0.25 mg total) by mouth 3 (three) times daily as needed for anxiety.   alum & mag hydroxide-simeth 200-200-20 MG/5ML suspension Commonly known as: MAALOX/MYLANTA Take 30 mLs by mouth every 4 (four) hours as needed for indigestion.   apixaban 5 MG Tabs tablet Commonly known as: ELIQUIS Take 1 tablet (5 mg total) by mouth 2 (two) times daily.   ascorbic acid 500 MG tablet Commonly known as: VITAMIN C Take 1 tablet (500 mg total) by mouth daily.   blood glucose meter kit and supplies Kit Dispense based on patient and insurance preference. Use up to four times daily as directed. (FOR ICD-9 250.00, 250.01).   chlorhexidine 0.12 % solution Commonly known as: PERIDEX 15 mLs by Mouth Rinse route 2 (two) times daily.   co-enzyme Q-10 50 MG capsule Take 50 mg by mouth daily.   collagenase ointment Commonly known as: SANTYL Apply topically 2 (two) times daily.   docusate sodium 100 MG capsule Commonly known as: COLACE Take 1 capsule (100 mg total) by mouth 2 (two) times daily.   insulin aspart 100 UNIT/ML injection Commonly known as: novoLOG Inject 0-15 Units into the skin 3 (three) times  daily with meals. Sliding scale insulin Less than 70 initiate hypoglycemia protocol 70-120 - 0 units,  120-150-  2 units,  151-200 -3 units,  201-250- 5 units,  251-300- 8 units,  301-350- 11 units,  351-400- 15 units Greater than 400 call MD   insulin glargine 100 UNIT/ML injection Commonly known as: LANTUS Inject 0.38 mLs (38 Units total) into the skin 2 (two) times daily. Replaces: insulin glargine (1 Unit Dial) 300 UNIT/ML Solostar Pen   linagliptin 5 MG Tabs tablet Commonly known as: TRADJENTA Take 1 tablet (5 mg total) by mouth daily. Start taking on: September 29, 2020   metFORMIN 1000 MG tablet Commonly known as: GLUCOPHAGE Take 1,000 mg by mouth 2 (two) times daily with a meal.   ondansetron 4 MG tablet Commonly known as: ZOFRAN Take 1 tablet (4 mg total) by mouth every 6 (six) hours as needed for nausea.   pantoprazole 40 MG tablet Commonly known as: PROTONIX Take 1 tablet (40 mg total) by mouth daily. Start taking on: September 29, 2020   Pen Needles 32G X 4 MM Misc 1 Package by Does not apply route 4 (four) times daily -  before meals and at bedtime.  polyethylene glycol 17 g packet Commonly known as: MIRALAX / GLYCOLAX Take 17 g by mouth daily as needed for moderate constipation.   predniSONE 10 MG tablet Commonly known as: DELTASONE Prednisone 40 mg po daily x 1 day then, 30 mg po daily x 1 day then, 20 mg po daily x 1 day then Prednisone 10 mg daily x 1 day then stop... What changed: See the new instructions.   tamsulosin 0.4 MG Caps capsule Commonly known as: FLOMAX Take 1 capsule (0.4 mg total) by mouth daily. Start taking on: September 29, 2020   zinc sulfate 220 (50 Zn) MG capsule Take 1 capsule (220 mg total) by mouth daily.      Allergies  Allergen Reactions  . Eggs Or Egg-Derived Products Hives      The results of significant diagnostics from this hospitalization (including imaging, microbiology, ancillary and laboratory) are listed below for  reference.    Significant Diagnostic Studies: CT HEAD W & WO CONTRAST  Result Date: 09/07/2020 CLINICAL DATA:  Right eye vision loss. EXAM: CT HEAD WITHOUT AND WITH CONTRAST TECHNIQUE: Contiguous axial images were obtained from the base of the skull through the vertex without and with intravenous contrast CONTRAST:  40m OMNIPAQUE IOHEXOL 300 MG/ML  SOLN COMPARISON:  None. FINDINGS: Brain: There is no evidence of an acute infarct, intracranial hemorrhage, mass, midline shift, or extra-axial fluid collection. Mild cerebral atrophy is within normal limits for age. Hypodensities in the cerebral white matter bilaterally are nonspecific but compatible with mild chronic small vessel ischemic disease. No abnormal enhancement is identified. Vascular: Calcified atherosclerosis at the skull base. Major dural venous sinuses and large intracranial arteries are grossly patent. Skull: No fracture or suspicious osseous lesion. Sinuses/Orbits: Visualized paranasal sinuses are clear. Moderate right mastoid effusion. Bilateral cataract extraction. Other: None. IMPRESSION: 1. No evidence of acute intracranial abnormality or mass. 2. Mild chronic small vessel ischemic disease. 3. Moderate right mastoid effusion. Electronically Signed   By: ALogan BoresM.D.   On: 09/07/2020 18:19   CT ANGIO CHEST PE W OR WO CONTRAST  Result Date: 09/17/2020 CLINICAL DATA:  Respiratory failure EXAM: CT ANGIOGRAPHY CHEST WITH CONTRAST TECHNIQUE: Multidetector CT imaging of the chest was performed using the standard protocol during bolus administration of intravenous contrast. Multiplanar CT image reconstructions and MIPs were obtained to evaluate the vascular anatomy. CONTRAST:  738mOMNIPAQUE IOHEXOL 350 MG/ML SOLN COMPARISON:  None. FINDINGS: Cardiovascular: Satisfactory opacification of the pulmonary arteries to the segmental level. No evidence of pulmonary embolism. Cardiomegaly. Three-vessel coronary artery calcifications. Enlargement of  the main pulmonary artery measuring up to 3.5 cm in caliber. No pericardial effusion. Mediastinum/Nodes: No enlarged mediastinal, hilar, or axillary lymph nodes. Thyroid gland, trachea, and esophagus demonstrate no significant findings. Lungs/Pleura: Extensive bilateral heterogeneous and interstitial airspace opacity, predominantly peripheral and basilar. Small bilateral pleural effusions. Upper Abdomen: No acute abnormality. Musculoskeletal: No chest wall abnormality. No acute or significant osseous findings. Review of the MIP images confirms the above findings. IMPRESSION: 1. Negative examination for pulmonary embolism. 2. Extensive bilateral heterogeneous and interstitial airspace opacity, predominantly peripheral and basilar. Small bilateral pleural effusions. Findings are consistent with multifocal infection, edema, and/or ARDS, including COVID-19 pneumonia if clinically suspected. 3. Cardiomegaly and coronary artery disease. 4. Enlargement of the main pulmonary artery measuring up to 3.5 cm in caliber, which can be seen in pulmonary hypertension. Electronically Signed   By: AlEddie Candle.D.   On: 09/17/2020 15:42   [facility]  Result  Date: 09/17/2020 CLINICAL DATA:  ARDS, COVID-19 positivity EXAM: PORTABLE CHEST 1 VIEW COMPARISON:  09/12/2020 FINDINGS: Cardiac shadow is enlarged but stable. This is accentuated by the portable technique. Poor inspiratory effort is noted. Persistent bilateral airspace opacities are noted similar to that seen on recent CT examination consistent with the given clinical history. No sizable effusion is noted. No bony abnormality is seen. IMPRESSION: Stable bilateral airspace opacities consistent with the given clinical history of COVID-19 positivity. Electronically Signed   By: Inez Catalina M.D.   On: 09/17/2020 15:53   DG CHEST PORT 1 VIEW  Result Date: 09/12/2020 CLINICAL DATA:  Pneumonia. EXAM: PORTABLE CHEST 1 VIEW COMPARISON:  Prior chest radiograph  08/29/2020 and earlier. FINDINGS: A previously demonstrated enteric tube is no longer appreciated. Shallow inspiration radiograph. Unchanged cardiomegaly. New from the prior exam, there is patchy airspace disease within the left upper lobe. Redemonstrated bibasilar opacities which may reflect atelectasis or pneumonia. Small bilateral pleural effusions are difficult to exclude. No evidence of pneumothorax. No acute bony abnormality. Air distention of the stomach. IMPRESSION: Shallow inspiration radiograph. Progressive patchy airspace disease within the left upper lobe. Similar appearance of bibasilar opacities which may reflect atelectasis or pneumonia. Small bilateral pleural effusions are difficult to exclude. Interval removal of a previously demonstrated enteric tube. Air distension of the stomach. Electronically Signed   By: Kellie Simmering DO   On: 09/12/2020 10:40   DG CHEST PORT 1 VIEW  Result Date: 08/29/2020 CLINICAL DATA:  Shortness of breath. EXAM: PORTABLE CHEST 1 VIEW COMPARISON:  August 29, 2020 (4:46 a.m.) FINDINGS: There is stable nasogastric tube positioning. The left-sided chest tube seen on the prior study has been removed. Persistent moderate to marked severity areas of atelectasis and/or infiltrate are seen within the bilateral lung bases. There is no evidence of a pleural effusion or residual pneumothorax. The cardiac silhouette is mildly enlarged. Degenerative changes seen throughout the thoracic spine. IMPRESSION: 1. Interval left-sided chest tube removal without evidence of a residual pneumothorax. 2. Persistent moderate to marked severity bibasilar atelectasis and/or infiltrate. Electronically Signed   By: Virgina Norfolk M.D.   On: 08/29/2020 23:14   VAS US CAROTID  Result Date: 09/08/2020 Carotid Arterial Duplex Study Indications:       Visual disturbance. Comparison Study:  No prior study Performing Technologist: Maudry Mayhew MHA, RDMS, RVT, RDCS  Examination Guidelines: A  complete evaluation includes B-mode imaging, spectral Doppler, color Doppler, and power Doppler as needed of all accessible portions of each vessel. Bilateral testing is considered an integral part of a complete examination. Limited examinations for reoccurring indications may be performed as noted.  Right Carotid Findings: +----------+--------+--------+--------+--------------------------+--------+           PSV cm/sEDV cm/sStenosisPlaque Description        Comments +----------+--------+--------+--------+--------------------------+--------+ CCA Prox  167     17                                                 +----------+--------+--------+--------+--------------------------+--------+ CCA Distal109     37                                                 +----------+--------+--------+--------+--------------------------+--------+ ICA Prox  109  37              smooth and heterogenous            +----------+--------+--------+--------+--------------------------+--------+ ICA Distal122     39                                                 +----------+--------+--------+--------+--------------------------+--------+ ECA       123     7               heterogenous and irregular         +----------+--------+--------+--------+--------------------------+--------+ +----------+--------+-------+----------------+-------------------+           PSV cm/sEDV cmsDescribe        Arm Pressure (mmHG) +----------+--------+-------+----------------+-------------------+ [id number]            Multiphasic, WNL                    +----------+--------+-------+----------------+-------------------+ +---------+--------+--+--------+--+---------+ VertebralPSV cm/s60EDV cm/s30Antegrade +---------+--------+--+--------+--+---------+  Left Carotid Findings: +----------+--------+--------+--------+-----------------------+--------+           PSV cm/sEDV cm/sStenosisPlaque Description      Comments +----------+--------+--------+--------+-----------------------+--------+ CCA Prox  160     37                                              +----------+--------+--------+--------+-----------------------+--------+ CCA Distal138     45                                              +----------+--------+--------+--------+-----------------------+--------+ ICA Prox  140     48              smooth and heterogenous         +----------+--------+--------+--------+-----------------------+--------+ ICA Distal94      35                                              +----------+--------+--------+--------+-----------------------+--------+ ECA       133     11              heterogenous                    +----------+--------+--------+--------+-----------------------+--------+ +----------+--------+--------+----------------+-------------------+           PSV cm/sEDV cm/sDescribe        Arm Pressure (mmHG) +----------+--------+--------+----------------+-------------------+ Subclavian170             Multiphasic, WNL                    +----------+--------+--------+----------------+-------------------+ +---------+--------+--+--------+--+---------+ VertebralPSV cm/s56EDV cm/s26Antegrade +---------+--------+--+--------+--+---------+   Summary: Right Carotid: Velocities in the right ICA are consistent with a 1-39% stenosis. Left Carotid: Velocities in the left ICA are consistent with a 1-39% stenosis. Vertebrals:  Bilateral vertebral arteries demonstrate antegrade flow. Subclavians: Normal flow hemodynamics were seen in bilateral subclavian              arteries. *See table(s) above for measurements and observations.  Electronically signed by Servando Snare MD on 09/08/2020 at 5:29:55  PM.    Final     Microbiology: No results found for this or any previous visit (from the past 240 hour(s)).   Labs: Basic Metabolic Panel: Recent Labs  Lab 09/23/20 0306  NA 137  K 4.8   CL 94*  CO2 37*  GLUCOSE 137*  BUN 23  CREATININE 0.59*  CALCIUM 8.7*  MG 2.2   Liver Function Tests: No results for input(s): AST, ALT, ALKPHOS, BILITOT, PROT, ALBUMIN in the last 168 hours. No results for input(s): LIPASE, AMYLASE in the last 168 hours. No results for input(s): AMMONIA in the last 168 hours. CBC: Recent Labs  Lab 09/23/20 0306  WBC 8.6  NEUTROABS 5.7  HGB 11.9*  HCT 39.8  MCV 96.6  PLT 229    BNP (last 3 results) Recent Labs    08/08/20 1616 08/19/20 0730  BNP 68.3 111.0*     CBG: Recent Labs  Lab 09/27/20 1145 09/27/20 1645 09/27/20 2130 09/28/20 0812 09/28/20 1111  GLUCAP 180* 204* 109* 135* 249*       Signed:  Oswald Hillock MD.  [facility] 09/28/2020, 1:51 PM

## 2020-09-28 NOTE — Progress Notes (Signed)
Patient refused BIPAP for the night. Patient wearing oxygen set at 2lpm with Sp02=94%. Will continue to monitor patient.

## 2020-09-28 NOTE — TOC Progression Note (Signed)
Transition of Care [facility]) - Progression Note    Patient Details  Name: Bertram Haddix MRN: [id number] Date of Birth: 11-10-1951  Transition of Care [facility]) CM/SW [place], [place] Phone Number: 09/28/2020, 12:54 PM  Clinical Narrative:     CSW received call from [facility] who confirmed she received insurance authorization for patient. Lenna Sciara says she can accept patient for DC today if medically ready. CSW informed MD. Lenna Sciara confirmed that patient does not need another covid test to go to SNF.  Patient has SNF bed at [facility]. Insurance authorization has been approved.  CSW will continue to follow.  Expected Discharge Plan: [place] Barriers to Discharge: Continued Medical Work up  Expected Discharge Plan and Services Expected Discharge Plan: [place] In-house Referral: Clinical Social Work Discharge Planning Services: CM Consult   Living arrangements for the past 2 months: Single Family Home                                       Social Determinants of Health (SDOH) Interventions    Readmission Risk Interventions No flowsheet data found.

## 2020-09-28 NOTE — Progress Notes (Signed)
Attempted to call report to Nursing at [facility] for pt transferring with [facility]. Called twice but nursing did not answer. Will attempt again.

## 2020-11-07 ENCOUNTER — Other Ambulatory Visit (HOSPITAL_COMMUNITY)
Admission: RE | Admit: 2020-11-07 | Discharge: 2020-11-07 | Disposition: A | Discharge status: Home / Self Care | Payer: 59 | Source: Other Acute Inpatient Hospital | Attending: Oncology | Admitting: Oncology

## 2020-11-07 DIAGNOSIS — E119 Type 2 diabetes mellitus without complications: Secondary | ICD-10-CM | POA: Insufficient documentation

## 2020-11-07 LAB — COMPREHENSIVE METABOLIC PANEL
ALT: 26 U/L (ref 0–44)
AST: 19 U/L (ref 15–41)
Albumin: 3.7 g/dL (ref 3.5–5.0)
Alkaline Phosphatase: 54 U/L (ref 38–126)
Anion gap: 10 (ref 5–15)
BUN: 11 mg/dL (ref 8–23)
CO2: 26 mmol/L (ref 22–32)
Calcium: 9.6 mg/dL (ref 8.9–10.3)
Chloride: 99 mmol/L (ref 98–111)
Creatinine, Ser: 0.66 mg/dL (ref 0.61–1.24)
GFR, Estimated: >60 mL/min (ref >60)
Glucose, Bld: 220 mg/dL — ABNORMAL HIGH (ref 70–99)
Potassium: 4 mmol/L (ref 3.5–5.1)
Sodium: 135 mmol/L (ref 135–145)
Total Bilirubin: 0.6 mg/dL (ref 0.3–1.2)
Total Protein: 6.8 g/dL (ref 6.5–8.1)

## 2020-11-07 LAB — CBC WITH DIFFERENTIAL/PLATELET
Abs Immature Granulocytes: 0.04 10*3/uL (ref 0.00–0.07)
Basophils Absolute: 0 10*3/uL (ref 0.0–0.1)
Basophils Relative: 0 %
Eosinophils Absolute: 0.2 10*3/uL (ref 0.0–0.5)
Eosinophils Relative: 2 %
HCT: 42.6 % (ref 39.0–52.0)
Hemoglobin: 13.8 g/dL (ref 13.0–17.0)
Immature Granulocytes: 1 %
Lymphocytes Relative: 22 %
Lymphs Abs: 1.8 10*3/uL (ref 0.7–4.0)
MCH: 27.9 pg (ref 26.0–34.0)
MCHC: 32.4 g/dL (ref 30.0–36.0)
MCV: 86.2 fL (ref 80.0–100.0)
Monocytes Absolute: 0.6 10*3/uL (ref 0.1–1.0)
Monocytes Relative: 8 %
Neutro Abs: 5.4 10*3/uL (ref 1.7–7.7)
Neutrophils Relative %: 67 %
Platelets: 244 10*3/uL (ref 150–400)
RBC: 4.94 MIL/uL (ref 4.22–5.81)
RDW: 14.9 % (ref 11.5–15.5)
WBC: 7.9 10*3/uL (ref 4.0–10.5)
nRBC: 0 % (ref 0.0–0.2)

## 2020-11-07 LAB — BRAIN NATRIURETIC PEPTIDE: B Natriuretic Peptide: 43 pg/mL (ref 0.0–100.0)

## 2020-12-22 ENCOUNTER — Ambulatory Visit: Payer: 59 | Admitting: Cardiology

## 2020-12-26 DIAGNOSIS — I498 Other specified cardiac arrhythmias: Secondary | ICD-10-CM | POA: Insufficient documentation
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
## 2020-12-27 ENCOUNTER — Other Ambulatory Visit: Payer: Self-pay

## 2020-12-27 ENCOUNTER — Ambulatory Visit: Payer: 59 | Admitting: Cardiology

## 2020-12-27 ENCOUNTER — Encounter: Payer: Self-pay | Admitting: Cardiology

## 2020-12-27 VITALS — BP 138/86 | HR 100 | Ht 70.0 in | Wt 197.0 lb

## 2020-12-27 DIAGNOSIS — E118 Type 2 diabetes mellitus with unspecified complications: Secondary | ICD-10-CM

## 2020-12-27 DIAGNOSIS — I498 Other specified cardiac arrhythmias: Secondary | ICD-10-CM | POA: Diagnosis not present

## 2020-12-27 DIAGNOSIS — I10 Essential (primary) hypertension: Secondary | ICD-10-CM

## 2020-12-27 NOTE — Patient Instructions (Addendum)
Medication Instructions:  The current medical regimen is effective;  continue present plan and medications.  *If you need a refill on your cardiac medications before your next appointment, please call your pharmacy*  Follow-Up: At [facility], you and your health needs are our priority.  As part of our continuing mission to provide you with exceptional heart care, we have created designated Provider Care Teams.  These Care Teams include your primary Cardiologist (physician) and Advanced Practice Providers (APPs -  Physician Assistants and Nurse Practitioners) who all work together to provide you with the care you need, when you need it.  We recommend signing up for the patient portal called "MyChart".  Sign up information is provided on this After Visit Summary.  MyChart is used to connect with patients for Virtual Visits (Telemedicine).  Patients are able to view lab/test results, encounter notes, upcoming appointments, etc.  Non-urgent messages can be sent to your provider as well.   To learn more about what you can do with MyChart, go to [email or web address].    Your next appointment:   6 month(s)  The format for your next appointment:   In Person  Provider:   James Hochrein, MD   Thank you for choosing [facility]!!     

## 2021-02-09 ENCOUNTER — Ambulatory Visit (HOSPITAL_COMMUNITY)
Admission: RE | Admit: 2021-02-09 | Discharge: 2021-02-09 | Disposition: A | Discharge status: Home / Self Care | Payer: 59 | Source: Ambulatory Visit | Attending: Internal Medicine | Admitting: Internal Medicine

## 2021-02-09 ENCOUNTER — Ambulatory Visit: Payer: 59 | Admitting: Internal Medicine

## 2021-02-09 ENCOUNTER — Encounter: Payer: Self-pay | Admitting: Internal Medicine

## 2021-02-09 ENCOUNTER — Other Ambulatory Visit: Payer: Self-pay

## 2021-02-09 DIAGNOSIS — I82403 Acute embolism and thrombosis of unspecified deep veins of lower extremity, bilateral: Secondary | ICD-10-CM

## 2021-02-09 DIAGNOSIS — U071 COVID-19: Secondary | ICD-10-CM

## 2021-02-09 DIAGNOSIS — K219 Gastro-esophageal reflux disease without esophagitis: Secondary | ICD-10-CM

## 2021-02-09 DIAGNOSIS — J9611 Chronic respiratory failure with hypoxia: Secondary | ICD-10-CM

## 2021-02-09 MED ORDER — FAMOTIDINE 20 MG PO TABS: ORAL_TABLET | ORAL | 11 refills | Status: DC

## 2021-02-09 NOTE — Patient Instructions (Addendum)
Continue pantoprazole 40 mg Take 30-60 min before first meal of the day and add pepcid 20 mg after supper    GERD (REFLUX)  is an extremely common cause of respiratory symptoms just like yours , many times with no obvious heartburn at all.    It can be treated with medication, but also with lifestyle changes including elevation of the head of your bed (ideally with 6 -8inch blocks under the headboard of your bed),  Smoking cessation, avoidance of late meals, excessive alcohol, and avoid fatty foods, chocolate, peppermint, colas, red wine, and acidic juices such as orange juice.  NO MINT OR MENTHOL PRODUCTS SO NO COUGH DROPS  USE SUGARLESS CANDY INSTEAD (Jolley ranchers or Stover's or Life Savers) or even ice chips will also do - the key is to swallow to prevent all throat clearing. NO OIL BASED VITAMINS - use powdered substitutes.  Avoid fish oil.  No need for inhalers   Please remember to go to the  x-ray department  @  [facility] for your tests - we will call you with the results when they are available     Make sure you check your oxygen saturation  at your highest level of activity  to be sure it stays over 90% and adjust  02 flow upward to maintain this level if needed but remember to turn it back to previous settings when you stop (to conserve your supply).   Please schedule a follow up office visit in 6 weeks, call sooner if needed with PFT's on return  Add Venous dopplers on return also

## 2021-02-09 NOTE — Assessment & Plan Note (Addendum)
Onset of illness 07/22/20 c/b bilateral dvt and L PTX / 02 dep at d/c 09/28/20  -  02/09/2021   Walked RA  approx   100 ft  @ slow pace  stopped due to  desats to 87% corrected to 90% on 1lpm  POC x another 100 ft then desat again   As of 02/09/2021 rec :  Titrate 02 up to max flow with goal of > 90% and call if this is not adequate   cxr still shows significant FP change bilerally with low lung volumes, would  Be worried about PE if he weren't aleady on elquis > continue for now  (see dvt)   Return for full pfts in 6 weeks   -

## 2021-02-09 NOTE — Assessment & Plan Note (Signed)
Advised gerd can cause hoarseness and if severe enough interfere with nl healing process (fibroproliferative lung dz by fueling additional lung injury) > max gerd rx/ diet pending f/u with pfts

## 2021-02-09 NOTE — Assessment & Plan Note (Signed)
See venous dopplers 08/09/20 with echo 08/26/20 neg for elevated R sided pressures   >>> needs repeat venous dopplers and ok off eliquis if neg now that fully mobile

## 2021-02-09 NOTE — Progress Notes (Signed)
Tim Day [place], male    DOB: 10/30/1951,    MRN: [id number]   Brief patient profile:  23 yowm never smoker with DM then covid Jul 28 2020   Admit date: 08/19/2020 Discharge date: 09/28/2020  Recommendations for Outpatient Follow-up:  1. Follow-up urology in 2 weeks for voiding trial 2. Patient will be discharged with Foley catheter 3. Continue oxygen 2 to 3 L/min via nasal cannula, wean off as tolerated 4. Follow-up with ophthalmology as outpatient  Discharge Diagnoses:  Active Problems:   DM2 (diabetes mellitus, type 2) (HCC)   Benign essential HTN   Dyslipidemia   Chest tube in place   COVID-19 virus infection   Acute hypoxemic respiratory failure due to COVID-19 [name])   Pneumothorax, left   Pneumonia due to COVID-19 virus   Leg DVT (deep venous thromboembolism), acute, bilateral (HCC)   Pressure injury of skin   Acute respiratory failure with hypoxia (HCC)   HCAP (healthcare-associated pneumonia)   Primary spontaneous pneumothorax   Protein-calorie malnutrition, severe   Adjustment disorder with anxious mood    History of present illness:  70 year old male with recent Covid [id number],02/02/3831 -08/14/2020,complicated by bilateral DVTs was admitted 08/19/2020 with left pneumothorax requiring chest tube placement and subsequent intubation 08/20/2020.He was extubated following day but had persistent bilateral infiltrates requiring high flow nasal cannula 10 to 15 L/minoxygen.Hospital course complicated by urinary retention, Foley catheter was inserted. Also had episode of sudden loss of vision in right eye which has now resolved. Neurology work-up was negative.  Hospital Course:  1. Acute hypoxemic and hypercarbic respiratory failure-secondary to recent COVID-19 pneumonia, s/p left pneumothorax. Wean down to 2 L nasal cannula oxygen, continue to wean off as tolerated. Continue to wean prednisone.  Will discharge on prednisone taper for 5 more days.   Continue as needed albuterol inhaler. 2. Acute urine retention-started on tamsulosin, Foley catheter. Failed voiding trial x2.   Urology recommends to discharge patient with Foley catheter. Urology will see patient as outpatient. 3. Bilateral DVTs- continue Eliquis. 4. Diabetes mellitus type 2-continue Lantus, sliding scale insulin with NovoLog, linagliptin.CBG well controlled. 5. Depression/anxiety-continue Ativan as needed 6. Sudden unilateral vision loss-resolved, patient had episode of sudden loss of vision right eye on 09/07/2020, he was unable to see anything for few seconds only, later on his vision came back but it remained blurry for 15 to 20 minutes. CT head without contrast was negative. MRI was unremarkable. Bilateral carotid duplex was unremarkable.  Discussed with patient and his wife, he needs to follow-up with ophthalmology as outpatient. 7. Left eye subconjunctival hemorrhage-stable       History of Present Illness  02/09/2021  Pulmonary/ 1st office eval/[name]  Chief Complaint  Patient presents with  . Consult    Shortness of breath with a lot of exertion  Dyspnea:  Walks x 10 laps = ,maybe 6- 8 min at slow pace at building / walks s 02 with  sats 86% Baseline faster walk s 02  Cough: some pnds mucus is clear Sleep: has sleep number bed but still in incliner with 30 degrees fine  SABA use: not helping 02  1.5 hs conc and prn daytime but not titrating   No obvious day to day or daytime variability or assoc excess/ purulent sputum or mucus plugs or hemoptysis or cp or chest tightness, subjective wheeze or overt sinus or hb symptoms.   sleeping without nocturnal  or early am exacerbation  of respiratory  c/o's or need for noct saba. Also  denies any obvious fluctuation of symptoms with weather or environmental changes or other aggravating or alleviating factors except as outlined above   No unusual exposure hx or h/o childhood pna/ asthma or knowledge of premature  birth.  Current Allergies, Complete Past Medical History, Past Surgical History, Family History, and Social History were reviewed in [employer] record.  ROS  The following are not active complaints unless bolded Hoarseness, sore throat, dysphagia, dental problems, itching, sneezing,  nasal congestion or discharge of excess mucus or purulent secretions, ear ache,   fever, chills, sweats, unintended wt loss or wt gain, classically pleuritic or exertional cp,  orthopnea pnd or arm/hand swelling  or leg swelling, presyncope, palpitations, abdominal pain, anorexia, nausea, vomiting, diarrhea  or change in bowel habits or change in bladder habits, change in stools or change in urine, dysuria, hematuria,  rash, arthralgias, visual complaints, headache, numbness, weakness or ataxia or problems with walking or coordination,  change in mood or  memory.           Past Medical History:  Diagnosis Date  . Acute respiratory disease   . Atypical mole 12/30/2012   severe left post shoulder tx exc  . Candidiasis of urogenital sites   . Diabetes mellitus   . Diffuse myofascial pain syndrome   . Esophageal reflux   . Hypertension   . Impacted cerumen of right ear   . Melanoma ([name]) 06/14/2011   left ear mohs  . Mixed hyperlipidemia   . MM (malignant melanoma of skin) ([name]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  . Primary insomnia   . Seborrheic dermatitis, unspecified   . Squamous cell carcinoma of skin 06/14/2011   left forearm medial cx3 63f  . Thrombocytopenia, unspecified ([place]     Outpatient Medications Prior to Visit  Medication Sig Dispense Refill  . acetaminophen (TYLENOL) 325 MG tablet Take 2 tablets (650 mg total) by mouth every 6 (six) hours as needed for mild pain, fever or headache.    . albuterol (VENTOLIN HFA) 108 (90 Base) MCG/ACT inhaler Inhale 2 puffs into the lungs every 6 (six) hours as needed for wheezing or shortness of breath.    . ALPRAZolam (XANAX)  0.25 MG tablet Take 0.25 mg by mouth 2 (two) times daily as needed for anxiety.    .Marland Kitchenalum & mag hydroxide-simeth (MAALOX/MYLANTA) 200-200-20 MG/5ML suspension Take 30 mLs by mouth every 4 (four) hours as needed for indigestion. 355 mL 0  . apixaban (ELIQUIS) 5 MG TABS tablet Take 1 tablet (5 mg total) by mouth 2 (two) times daily. 60 tablet 0  . ascorbic acid (VITAMIN C) 500 MG tablet Take 1 tablet (500 mg total) by mouth daily. 30 tablet 0  . blood glucose meter kit and supplies KIT Dispense based on patient and insurance preference. Use up to four times daily as directed. (FOR ICD-9 250.00, 250.01). 1 each 0  . co-enzyme Q-10 50 MG capsule Take 50 mg by mouth daily.    . furosemide (LASIX) 20 MG tablet Take 20 mg by mouth daily.    . insulin aspart (NOVOLOG) 100 UNIT/ML injection Inject 0-15 Units into the skin 3 (three) times daily with meals. Sliding scale insulin Less than 70 initiate hypoglycemia protocol 70-120 - 0 units,  120-150-  2 units,  151-200 -3 units,  201-250- 5 units,  251-300- 8 units,  301-350- 11 units,  351-400- 15 units Greater than 400 call MD 10 mL 11  . Insulin Pen Needle (PEN  NEEDLES) 32G X 4 MM MISC 1 Package by Does not apply route 4 (four) times daily -  before meals and at bedtime. 1 each 0  . metFORMIN (GLUCOPHAGE) 1000 MG tablet Take 1,000 mg by mouth 2 (two) times daily with a meal.    . Multiple Vitamin (MULTIVITAMIN) capsule Take 1 capsule by mouth daily.    . ondansetron (ZOFRAN) 4 MG tablet Take 1 tablet (4 mg total) by mouth every 6 (six) hours as needed for nausea. 20 tablet 0  . pantoprazole (PROTONIX) 40 MG tablet Take 1 tablet (40 mg total) by mouth daily.    . sitaGLIPtin (JANUVIA) 100 MG tablet Take 100 mg by mouth daily.    . tamsulosin (FLOMAX) 0.4 MG CAPS capsule Take 1 capsule (0.4 mg total) by mouth daily. 30 capsule   . zinc sulfate 220 (50 Zn) MG capsule Take 1 capsule (220 mg total) by mouth daily. 30 capsule 0   No facility-administered  medications prior to visit.     Objective:     BP 110/60 (BP Location: Left Arm, Cuff Size: Normal)   Pulse 87   Temp 98.6 F (37 C) (Temporal)   Ht '5\' 10"'  (1.778 m)   Wt 200 lb (90.7 kg)   SpO2 92% Comment: Room air  BMI 28.70 kg/m   SpO2: 92 % (Room air)   amb slt hoarse wm nad    HEENT : pt wearing mask not removed for exam due to covid -19 concerns.    NECK :  without JVD/Nodes/TM/ nl carotid upstrokes bilaterally   LUNGS: no acc muscle use,  Nl contour chest with trace insp crackles, a few rhonchi bilaterally without cough on insp or exp maneuvers   CV:  RRR  no s3 or murmur or increase in P2, and no edema   ABD:  soft and nontender with nl inspiratory excursion in the supine position. No bruits or organomegaly appreciated, bowel sounds nl  MS:  Nl gait/ ext warm without deformities, calf tenderness, cyanosis or clubbing No obvious joint restrictions   SKIN: warm and dry without lesions    NEURO:  alert, approp, nl sensorium with  no motor or cerebellar deficits apparent.    CXR PA and Lateral:   02/09/2021 :    I personally reviewed images and  impression as follows:   Reduced lung marking with significant residual fibrotic changes bilaterally       Labs ordered/ reviewed:      Chemistry      Component Value Date/Time   NA 135 11/07/2020 1336   K 4.0 11/07/2020 1336   CL 99 11/07/2020 1336   CO2 26 11/07/2020 1336   BUN 11 11/07/2020 1336   CREATININE 0.66 11/07/2020 1336      Component Value Date/Time   CALCIUM 9.6 11/07/2020 1336   ALKPHOS 54 11/07/2020 1336   AST 19 11/07/2020 1336   ALT 26 11/07/2020 1336   BILITOT 0.6 11/07/2020 1336        Lab Results  Component Value Date   WBC 7.9 11/07/2020   HGB 13.8 11/07/2020   HCT 42.6 11/07/2020   MCV 86.2 11/07/2020   PLT 244 11/07/2020       Lab Results  Component Value Date   TSH 2.161 09/17/2020          Lab Results  Component Value Date   ESRSEDRATE 53 (H) 09/11/2020           Assessment   COVID-19 virus  infection Onset of illness 07/22/20 c/b bilateral dvt and L PTX / 02 dep at d/c 09/28/20  -  02/09/2021   Walked RA  approx   100 ft  @ slow pace  stopped due to  desats to 87% corrected to 90% on 1lpm  POC x another 100 ft then desat again   As of 02/09/2021 rec :  Titrate 02 up to max flow with goal of > 90% and call if this is not adequate   cxr still shows significant FP change bilerally with low lung volumes, would  Be worried about PE if he weren't aleady on elquis > continue for now  (see dvt)   Return for full pfts in 6 weeks   -   Leg DVT (deep venous thromboembolism), acute, bilateral ([place]) See venous dopplers 08/09/20 with echo 08/26/20 neg for elevated R sided pressures   >>> needs repeat venous dopplers and ok off eliquis if neg now that fully mobile     Chronic respiratory failure with hypoxia ([place]) Onset with covid pna Aug 2021  - desats on RA p 100 ft 02/09/2021 > titrate to sats > 90% using POC max flow of 6lpm   Was not previously using 02 with ex and limited by hypoxemia so advised: Make sure you check your oxygen saturation  at your highest level of activity  to be sure it stays over 90% and adjust  02 flow upward to maintain this level if needed but remember to turn it back to previous settings when you stop (to conserve your supply).     Esophageal reflux Advised gerd can cause hoarseness and if severe enough interfere with nl healing process (fibroproliferative lung dz by fueling additional lung injury) > max gerd rx/ diet pending f/u with pfts    Each maintenance medication was reviewed in detail including emphasizing most importantly the difference between maintenance and prns and under what circumstances the prns are to be triggered using an action plan format where appropriate.  Total time for H and P, chart review, counseling,  directly observing portions of ambulatory 02 saturation study/ and generating customized AVS  unique to this office visit / same day charting = 45 min               Christinia Gully, MD 02/09/2021

## 2021-02-09 NOTE — Assessment & Plan Note (Signed)
Onset with covid pna Aug 2021  - desats on RA p 100 ft 02/09/2021 > titrate to sats > 90% using POC max flow of 6lpm   Was not previously using 02 with ex and limited by hypoxemia so advised: Make sure you check your oxygen saturation  at your highest level of activity  to be sure it stays over 90% and adjust  02 flow upward to maintain this level if needed but remember to turn it back to previous settings when you stop (to conserve your supply).    Each maintenance medication was reviewed in detail including emphasizing most importantly the difference between maintenance and prns and under what circumstances the prns are to be triggered using an action plan format where appropriate.  Total time for H and P, chart review, counseling,  directly observing portions of ambulatory 02 saturation study/ and generating customized AVS unique to this office visit / same day charting = 45 min

## 2021-02-12 ENCOUNTER — Telehealth: Payer: Self-pay | Admitting: Internal Medicine

## 2021-02-12 NOTE — Telephone Encounter (Signed)
Call report Janie at [facility] radiology:  IMPRESSION: Newly apparent bilateral nodular opacities, largest 1 cm in the upper left lung. Chest CT with IV contrast recommended for further evaluation.  Low lung volumes. Hazy patchy and reticular opacities in both lungs, improved from prior, cannot exclude nonspecific fibrosis such as postinflammatory fibrosis due to COVID.  MW please advise.

## 2021-02-12 NOTE — Progress Notes (Signed)
Spoke with pt and notified of results per Dr. Wert. Pt verbalized understanding and denied any questions. 

## 2021-02-13 NOTE — Telephone Encounter (Signed)
See result note.  

## 2021-03-02 ENCOUNTER — Telehealth: Payer: Self-pay | Admitting: Internal Medicine

## 2021-03-02 NOTE — Telephone Encounter (Signed)
Spoke to patient.  Advised that this is scheduled thru respiratory therapy.  I called & left another message for RT to contact the patient to schedule.  Also provided the contact number to RT.  Verbalized understanding & nothing further needed at this time.

## 2021-03-02 NOTE — Telephone Encounter (Signed)
PCC's- can you please contact [facility] to find out why this PFT has not been set up yet? Thanks

## 2021-03-06 ENCOUNTER — Other Ambulatory Visit: Payer: Self-pay

## 2021-03-06 ENCOUNTER — Other Ambulatory Visit (HOSPITAL_COMMUNITY)
Admission: RE | Admit: 2021-03-06 | Discharge: 2021-03-06 | Disposition: A | Discharge status: Home / Self Care | Payer: 59 | Source: Ambulatory Visit | Attending: Internal Medicine | Admitting: Internal Medicine

## 2021-03-06 DIAGNOSIS — Z20822 Contact with and (suspected) exposure to covid-19: Secondary | ICD-10-CM | POA: Insufficient documentation

## 2021-03-06 DIAGNOSIS — Z01812 Encounter for preprocedural laboratory examination: Secondary | ICD-10-CM | POA: Diagnosis present

## 2021-03-07 ENCOUNTER — Ambulatory Visit (HOSPITAL_COMMUNITY)
Admission: RE | Admit: 2021-03-07 | Discharge: 2021-03-07 | Disposition: A | Discharge status: Home / Self Care | Payer: 59 | Source: Ambulatory Visit | Attending: Internal Medicine | Admitting: Internal Medicine

## 2021-03-07 DIAGNOSIS — U071 COVID-19: Secondary | ICD-10-CM | POA: Diagnosis present

## 2021-03-07 LAB — PULMONARY FUNCTION TEST
DL/VA % pred: 129 %
DL/VA: 5.31 ml/min/mmHg/L
DLCO unc % pred: 43 %
DLCO unc: 11.4 ml/min/mmHg
FEF 25-75 Post: 2.28 L/sec
FEF 25-75 Pre: 2.1 L/sec
FEF2575-%Change-Post: 8 %
FEF2575-%Pred-Post: 87 %
FEF2575-%Pred-Pre: 80 %
FEV1-%Change-Post: 4 %
FEV1-%Pred-Post: 39 %
FEV1-%Pred-Pre: 37 %
FEV1-Post: 1.32 L
FEV1-Pre: 1.26 L
FEV1FVC-%Change-Post: 0 %
FEV1FVC-%Pred-Pre: 124 %
FEV6-%Change-Post: 4 %
FEV6-%Pred-Post: 33 %
FEV6-%Pred-Pre: 31 %
FEV6-Post: 1.44 L
FEV6-Pre: 1.37 L
FEV6FVC-%Pred-Post: 105 %
FEV6FVC-%Pred-Pre: 105 %
FVC-%Change-Post: 4 %
FVC-%Pred-Post: 31 %
FVC-%Pred-Pre: 30 %
FVC-Post: 1.44 L
FVC-Pre: 1.37 L
Post FEV1/FVC ratio: 92 %
Post FEV6/FVC ratio: 100 %
Pre FEV1/FVC ratio: 92 %
Pre FEV6/FVC Ratio: 100 %
RV % pred: 31 %
RV: 0.74 L
TLC % pred: 30 %
TLC: 2.13 L

## 2021-03-07 LAB — SARS CORONAVIRUS 2 (TAT 6-24 HRS): SARS Coronavirus 2: NEGATIVE

## 2021-03-07 MED ORDER — ALBUTEROL SULFATE (2.5 MG/3ML) 0.083% IN NEBU
2.5000 mg | INHALATION_SOLUTION | Freq: Once | RESPIRATORY_TRACT | Status: AC
  Administered 2021-03-07: 2.5 mg via RESPIRATORY_TRACT

## 2021-03-13 ENCOUNTER — Other Ambulatory Visit: Payer: Self-pay

## 2021-03-13 ENCOUNTER — Encounter: Payer: Self-pay | Admitting: Physician Assistant

## 2021-03-13 ENCOUNTER — Ambulatory Visit: Payer: 59 | Admitting: Physician Assistant

## 2021-03-13 DIAGNOSIS — L219 Seborrheic dermatitis, unspecified: Secondary | ICD-10-CM

## 2021-03-13 DIAGNOSIS — L570 Actinic keratosis: Secondary | ICD-10-CM

## 2021-03-13 DIAGNOSIS — D485 Neoplasm of uncertain behavior of skin: Secondary | ICD-10-CM | POA: Diagnosis not present

## 2021-03-13 MED ORDER — KETOCONAZOLE 2 % EX CREA: 1.0000 "application " | TOPICAL_CREAM | Freq: Two times a day (BID) | CUTANEOUS | 6 refills | Status: AC

## 2021-03-13 MED ORDER — ALCLOMETASONE DIPROPIONATE 0.05 % EX CREA: TOPICAL_CREAM | Freq: Two times a day (BID) | CUTANEOUS | 3 refills | Status: DC | PRN

## 2021-03-21 NOTE — Progress Notes (Signed)
Follow-Up Visit   Subjective  Tim Day is a 70 y.o. male who presents for the following: Annual Exam (Patient here today for skin check. Per patient since he's had COVID is skin is flaky. Records review he has never had this condition in the past.  Personal history of melanoma, atypical mole, and non mole skin cancer.).   The following portions of the chart were reviewed this encounter and updated as appropriate:  Tobacco  Allergies  Meds  Problems  Med Hx  Surg Hx  Fam Hx      Objective  Well appearing patient in no apparent distress; mood and affect are within normal limits.  A full examination was performed including scalp, head, eyes, ears, nose, lips, neck, chest, axillae, abdomen, back, buttocks, bilateral upper extremities, bilateral lower extremities, hands, feet, fingers, toes, fingernails, and toenails. All findings within normal limits unless otherwise noted below.  Objective  Left Ear, Left Supraorbital Region, Mid Frontal Scalp, Right Ear, Right Supraorbital Region: Erythematous plaques with greasy scale.   Objective  Right Forearm - Posterior: Hyperkeratotic scale with pink base      Objective  Left Forearm - Posterior (2), Right Breast, Right Forearm - Posterior (3): Erythematous patches with gritty scale.   Assessment & Plan  Seborrheic dermatitis (5) Left Ear; Right Ear; Left Supraorbital Region; Right Supraorbital Region; Mid Frontal Scalp  alclomethasone (ACLOVATE) 0.05 % cream - Left Ear, Left Supraorbital Region, Mid Frontal Scalp, Right Ear, Right Supraorbital Region  ketoconazole (NIZORAL) 2 % cream - Left Ear, Left Supraorbital Region, Mid Frontal Scalp, Right Ear, Right Supraorbital Region  Neoplasm of uncertain behavior of skin Right Forearm - Posterior  Skin / nail biopsy Type of biopsy: tangential   Informed consent: discussed and consent obtained   Timeout: patient name, date of birth, surgical site, and procedure  verified   Procedure prep:  Patient was prepped and draped in usual sterile fashion (Non sterile) Prep type:  Chlorhexidine Anesthesia: the lesion was anesthetized in a standard fashion   Anesthetic:  1% lidocaine w/ epinephrine 1-100,000 local infiltration Instrument used: flexible razor blade   Outcome: patient tolerated procedure well   Post-procedure details: wound care instructions given    Destruction of lesion Complexity: simple   Destruction method: electrodesiccation and curettage   Informed consent: discussed and consent obtained   Timeout:  patient name, date of birth, surgical site, and procedure verified Anesthesia: the lesion was anesthetized in a standard fashion   Anesthetic:  1% lidocaine w/ epinephrine 1-100,000 local infiltration Curettage performed in three different directions: Yes     Electrodesiccation performed over the curetted area: No   Curettage cycles:  1 Margin per side (cm):  0.1 Final wound size (cm):  1 Hemostasis achieved with:  aluminum chloride Outcome: patient tolerated procedure well with no complications   Post-procedure details: wound care instructions given    Specimen 1 - Surgical pathology Differential Diagnosis: bcc vs scc  Check Margins: No  AK (actinic keratosis) (6) Left Forearm - Posterior (2); Right Forearm - Posterior (3); Right Breast  Destruction of lesion - Left Forearm - Posterior, Right Breast, Right Forearm - Posterior Complexity: simple   Destruction method: cryotherapy   Informed consent: discussed and consent obtained   Timeout:  patient name, date of birth, surgical site, and procedure verified Lesion destroyed using liquid nitrogen: Yes   Cryotherapy cycles:  3 Outcome: patient tolerated procedure well with no complications   Post-procedure details: wound care instructions given  I, Deshaun Schou, PA-C, have reviewed all documentation's for this visit.  The documentation on 03/21/21 for the exam,  diagnosis, procedures and orders are all accurate and complete.

## 2021-03-23 ENCOUNTER — Encounter: Payer: Self-pay | Admitting: Internal Medicine

## 2021-03-23 ENCOUNTER — Other Ambulatory Visit: Payer: Self-pay | Admitting: Internal Medicine

## 2021-03-23 ENCOUNTER — Ambulatory Visit (INDEPENDENT_AMBULATORY_CARE_PROVIDER_SITE_OTHER): Payer: 59 | Admitting: Internal Medicine

## 2021-03-23 ENCOUNTER — Other Ambulatory Visit (HOSPITAL_COMMUNITY)
Admission: RE | Admit: 2021-03-23 | Discharge: 2021-03-23 | Disposition: A | Discharge status: Home / Self Care | Payer: 59 | Source: Ambulatory Visit | Attending: Internal Medicine | Admitting: Internal Medicine

## 2021-03-23 ENCOUNTER — Other Ambulatory Visit: Payer: Self-pay

## 2021-03-23 ENCOUNTER — Ambulatory Visit (HOSPITAL_COMMUNITY)
Admission: RE | Admit: 2021-03-23 | Discharge: 2021-03-23 | Disposition: A | Discharge status: Home / Self Care | Payer: 59 | Source: Ambulatory Visit | Attending: Internal Medicine | Admitting: Internal Medicine

## 2021-03-23 DIAGNOSIS — U071 COVID-19: Secondary | ICD-10-CM | POA: Insufficient documentation

## 2021-03-23 DIAGNOSIS — I82403 Acute embolism and thrombosis of unspecified deep veins of lower extremity, bilateral: Secondary | ICD-10-CM | POA: Diagnosis not present

## 2021-03-23 DIAGNOSIS — J9611 Chronic respiratory failure with hypoxia: Secondary | ICD-10-CM | POA: Diagnosis not present

## 2021-03-23 LAB — CBC WITH DIFFERENTIAL/PLATELET
Abs Immature Granulocytes: 0.04 10*3/uL (ref 0.00–0.07)
Basophils Absolute: 0 10*3/uL (ref 0.0–0.1)
Basophils Relative: 0 %
Eosinophils Absolute: 0.1 10*3/uL (ref 0.0–0.5)
Eosinophils Relative: 2 %
HCT: 43.7 % (ref 39.0–52.0)
Hemoglobin: 14.2 g/dL (ref 13.0–17.0)
Immature Granulocytes: 1 %
Lymphocytes Relative: 24 %
Lymphs Abs: 1.7 10*3/uL (ref 0.7–4.0)
MCH: 27.7 pg (ref 26.0–34.0)
MCHC: 32.5 g/dL (ref 30.0–36.0)
MCV: 85.4 fL (ref 80.0–100.0)
Monocytes Absolute: 0.5 10*3/uL (ref 0.1–1.0)
Monocytes Relative: 8 %
Neutro Abs: 4.5 10*3/uL (ref 1.7–7.7)
Neutrophils Relative %: 65 %
Platelets: 199 10*3/uL (ref 150–400)
RBC: 5.12 MIL/uL (ref 4.22–5.81)
RDW: 14.6 % (ref 11.5–15.5)
WBC: 6.8 10*3/uL (ref 4.0–10.5)
nRBC: 0 % (ref 0.0–0.2)

## 2021-03-23 LAB — SEDIMENTATION RATE: Sed Rate: 25 mm/hr — ABNORMAL HIGH (ref 0–16)

## 2021-03-23 NOTE — Progress Notes (Signed)
Tim Day, male    DOB: Jul 18, 1951    MRN: [id number]   Brief patient profile:  70 yowm never smoker with DM then covid Jul 28 2020   Admit date: 08/19/2020 Discharge date: 09/28/2020  Recommendations for Outpatient Follow-up:  1. Follow-up urology in 2 weeks for voiding trial 2. Patient will be discharged with Foley catheter 3. Continue oxygen 2 to 3 L/min via nasal cannula, wean off as tolerated 4. Follow-up with ophthalmology as outpatient  Discharge Diagnoses:  Active Problems:   DM2 (diabetes mellitus, type 2) (HCC)   Benign essential HTN   Dyslipidemia   Chest tube in place   COVID-19 virus infection   Acute hypoxemic respiratory failure due to COVID-19 [facility])   Pneumothorax, left   Pneumonia due to COVID-19 virus   Leg DVT (deep venous thromboembolism), acute, bilateral (HCC)   Pressure injury of skin   Acute respiratory failure with hypoxia (HCC)   HCAP (healthcare-associated pneumonia)   Primary spontaneous pneumothorax   Protein-calorie malnutrition, severe   Adjustment disorder with anxious mood    History of present illness:  70 year old male with recent Covid [id number],08/05/4966 -08/14/2020,complicated by bilateral DVTs was admitted 08/19/2020 with left pneumothorax requiring chest tube placement and subsequent intubation 08/20/2020.He was extubated following day but had persistent bilateral infiltrates requiring high flow nasal cannula 10 to 15 L/minoxygen.Hospital course complicated by urinary retention, Foley catheter was inserted. Also had episode of sudden loss of vision in right eye which has now resolved. Neurology work-up was negative.  Hospital Course:  1. Acute hypoxemic and hypercarbic respiratory failure-secondary to recent COVID-19 pneumonia, s/p left pneumothorax. Wean down to 2 L nasal cannula oxygen, continue to wean off as tolerated. Continue to wean prednisone.  Will discharge on prednisone taper for 5 more days.   Continue as needed albuterol inhaler. 2. Acute urine retention-started on tamsulosin, Foley catheter. Failed voiding trial x2.   Urology recommends to discharge patient with Foley catheter. Urology will see patient as outpatient. 3. Bilateral DVTs- continue Eliquis. 4. Diabetes mellitus type 2-continue Lantus, sliding scale insulin with NovoLog, linagliptin.CBG well controlled. 5. Depression/anxiety-continue Ativan as needed 6. Sudden unilateral vision loss-resolved, patient had episode of sudden loss of vision right eye on 09/07/2020, he was unable to see anything for few seconds only, later on his vision came back but it remained blurry for 15 to 20 minutes. CT head without contrast was negative. MRI was unremarkable. Bilateral carotid duplex was unremarkable.  Discussed with patient and his wife, he needs to follow-up with ophthalmology as outpatient. 7. Left eye subconjunctival hemorrhage-stable       History of Present Illness  02/09/2021  Pulmonary/ 1st office eval/[name]  Chief Complaint  Patient presents with  . Consult    Shortness of breath with a lot of exertion  Dyspnea:  Walks x 10 laps = maybe 6-8 min at slow pace at building / walks s 02 with  sats 86% Baseline faster walk s 02  Cough: some pnds mucus is clear Sleep: has sleep number bed but still in incliner with 30 degrees fine  SABA use: not helping 02  1.5 hs conc and prn daytime but not titrating  rec Continue pantoprazole 40 mg Take 30-60 min before first meal of the day and add pepcid 20 mg after supper  GERD No need for inhalers  Please remember to go to the  x-ray department  @  [facility] for your tests - we will call you with the results  when they are available    Make sure you check your oxygen saturation  at your highest level of activity  to be sure it stays over 90% and adjust  02 flow upward to maintain this level if needed but remember to turn it back to previous settings when you stop (to  conserve your supply).  Please schedule a follow up office visit in 6 weeks, call sooner if needed with PFT's on return Add Venous dopplers on return also     03/23/2021  f/u ov/[place] office/Wert re: Covid [id number],02/04/6293 -08/14/2020,complicated by bilateral DVTs / 02 desp resp failure  Chief Complaint  Patient presents with  . Follow-up    Intermittent productive cough with clear phlegm   Dyspnea:  Walking up to an hour but stops because 02 drops 86% on 3lpm  Cough:  not a big problem / assoc with pnds sensation but no excess pnds/ sleep disturbance  Sleeping: no problem sleeping in recliner 30 degrees  SABA use: none  02: 3lpm 24/7  Covid status: 2nd shot feb 2022 pfizer      No obvious day to day or daytime variability or assoc excess/ purulent sputum or mucus plugs or hemoptysis or cp or chest tightness, subjective wheeze or overt sinus or hb symptoms.   Sleeping as above  without nocturnal  or early am exacerbation  of respiratory  c/o's or need for noct saba. Also denies any obvious fluctuation of symptoms with weather or environmental changes or other aggravating or alleviating factors except as outlined above   No unusual exposure hx or h/o childhood pna/ asthma or knowledge of premature birth.  Current Allergies, Complete Past Medical History, Past Surgical History, Family History, and Social History were reviewed in [employer] record.  ROS  The following are not active complaints unless bolded Hoarseness, sore throat, dysphagia, dental problems, itching, sneezing,  nasal congestion or discharge of excess mucus or purulent secretions, ear ache,   fever, chills, sweats, unintended wt loss or wt gain, classically pleuritic or exertional cp,  orthopnea pnd or arm/hand swelling  or leg swelling, presyncope, palpitations, abdominal pain, anorexia, nausea, vomiting, diarrhea  or change in bowel habits or change in bladder habits, change in stools or  change in urine, dysuria, hematuria,  rash, arthralgias, visual complaints, headache, numbness, weakness or ataxia or problems with walking or coordination,  change in mood or  memory.        Current Meds  Medication Sig  . ACCU-CHEK GUIDE test strip USE TO TEST TWICE DAILY.D  . acetaminophen (TYLENOL) 325 MG tablet Take 2 tablets (650 mg total) by mouth every 6 (six) hours as needed for mild pain, fever or headache.  . albuterol (VENTOLIN HFA) 108 (90 Base) MCG/ACT inhaler Inhale 2 puffs into the lungs every 6 (six) hours as needed for wheezing or shortness of breath.  Marland Kitchen alclomethasone (ACLOVATE) 0.05 % cream Apply topically 2 (two) times daily as needed (Rash).  . ALPRAZolam (XANAX) 0.25 MG tablet Take 0.25 mg by mouth 2 (two) times daily as needed for anxiety.  Marland Kitchen alum & mag hydroxide-simeth (MAALOX/MYLANTA) 200-200-20 MG/5ML suspension Take 30 mLs by mouth every 4 (four) hours as needed for indigestion.  Marland Kitchen ascorbic acid (VITAMIN C) 500 MG tablet Take 1 tablet (500 mg total) by mouth daily.  . blood glucose meter kit and supplies KIT Dispense based on patient and insurance preference. Use up to four times daily as directed. (FOR ICD-9 250.00, 250.01).  Marland Kitchen co-enzyme Q-10  50 MG capsule Take 50 mg by mouth daily.  . famotidine (PEPCID) 20 MG tablet Take 20 mg by mouth daily.  Marland Kitchen FIASP FLEXTOUCH 100 UNIT/ML FlexTouch Pen Inject into the skin.  Marland Kitchen insulin aspart (NOVOLOG) 100 UNIT/ML injection Inject 0-15 Units into the skin 3 (three) times daily with meals. Sliding scale insulin Less than 70 initiate hypoglycemia protocol 70-120 - 0 units,  120-150-  2 units,  151-200 -3 units,  201-250- 5 units,  251-300- 8 units,  301-350- 11 units,  351-400- 15 units Greater than 400 call MD  . Insulin Pen Needle (PEN NEEDLES) 32G X 4 MM MISC 1 Package by Does not apply route 4 (four) times daily -  before meals and at bedtime.  Marland Kitchen ketoconazole (NIZORAL) 2 % cream Apply 1 application topically 2 (two) times daily.  .  metFORMIN (GLUCOPHAGE) 1000 MG tablet Take 1,000 mg by mouth 2 (two) times daily with a meal.  . Multiple Vitamin (MULTIVITAMIN) capsule Take 1 capsule by mouth daily.  Marland Kitchen olmesartan-hydrochlorothiazide (BENICAR HCT) 20-12.5 MG tablet Take 1 tablet by mouth daily.  . ondansetron (ZOFRAN) 4 MG tablet Take 1 tablet (4 mg total) by mouth every 6 (six) hours as needed for nausea.  . pantoprazole (PROTONIX) 40 MG tablet Take 40 mg by mouth daily.  . rosuvastatin (CRESTOR) 5 MG tablet Take 5 mg by mouth daily.  . sildenafil (VIAGRA) 100 MG tablet Take 100 mg by mouth at bedtime.  Nelva Nay MAX SOLOSTAR 300 UNIT/ML Solostar Pen Inject into the skin.  Marland Kitchen zinc sulfate 220 (50 Zn) MG capsule Take 1 capsule (220 mg total) by mouth daily.  Marland Kitchen zolpidem (AMBIEN) 10 MG tablet Take 10 mg by mouth at bedtime as needed.              Past Medical History:  Diagnosis Date  . Acute respiratory disease   . Atypical mole 12/30/2012   severe left post shoulder tx exc  . Candidiasis of urogenital sites   . Diabetes mellitus   . Diffuse myofascial pain syndrome   . Esophageal reflux   . Hypertension   . Impacted cerumen of right ear   . Melanoma ([place]) 06/14/2011   left ear mohs  . Mixed hyperlipidemia   . MM (malignant melanoma of skin) ([place]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  . Primary insomnia   . Seborrheic dermatitis, unspecified   . Squamous cell carcinoma of skin 06/14/2011   left forearm medial cx3 34f  . Thrombocytopenia, unspecified (HCC)         Objective:      Wt Readings from Last 3 Encounters:  03/23/21 201 lb (91.2 kg)  02/09/21 200 lb (90.7 kg)  12/27/20 197 lb (89.4 kg)      Vital signs reviewed  03/23/2021  - Note at rest 02 sats  96% on 2lpm Pulsed    General appearance:    amb pleasant wm nad       HEENT : pt wearing mask not removed for exam due to covid -19 concerns.    NECK :  without JVD/Nodes/TM/ nl carotid upstrokes bilaterally   LUNGS: no acc muscle use,   Nl contour chest with decreased bs bases and a few insp crackles  bilaterally without cough on insp or exp maneuvers   CV:  RRR  no s3 or murmur or increase in P2, and no edema   ABD:  soft and nontender with nl inspiratory excursion in the supine position. No bruits  or organomegaly appreciated, bowel sounds nl  MS:  Nl gait/ ext warm without deformities, calf tenderness, cyanosis or clubbing No obvious joint restrictions   SKIN: warm and dry without lesions    NEURO:  alert, approp, nl sensorium with  no motor or cerebellar deficits apparent.             CXR PA and Lateral:   03/23/2021 :    I personally reviewed images and agree with radiology impression as follows:    Persistent 1.1 cm left upper lung nodule, unchanged from 02/09/2021 chest radiograph, not visualized prior to that chest radiograph, suspicious for a solid pulmonary nodule. Patchy reticular opacities in both lungs, most prominent in the lower lungs, similar. Given the history of prior COVID-19 infection, findings could represent postinflammatory fibrosis. High-resolution chest CT recommended to evaluate for left upper lobe pulmonary nodule and to characterize the suspected postinflammatory fibrosis.           Lab Results  Component Value Date   ESRSEDRATE 25 (H) 03/23/2021   ESRSEDRATE 53 (H) 09/11/2020       Lab Results  Component Value Date   WBC 6.8 03/23/2021   HGB 14.2 03/23/2021   HCT 43.7 03/23/2021   MCV 85.4 03/23/2021   PLT 199 03/23/2021       EOS                                                               0.1                                    03/23/2021     Assessment

## 2021-03-23 NOTE — Patient Instructions (Addendum)
Stop pantoprazole and change pepcid 20 mg after breakfast and supper x 1 week then just take one after supper x one week then stop  - if  flare go back one stop    We will call to schedule the venous dopplers   Please remember to go to the lab and x-ray department at [facility]   for your tests - we will call you with the results when they are available.       Please schedule a follow up visit in 3 months but call sooner if needed

## 2021-03-27 ENCOUNTER — Encounter: Payer: Self-pay | Admitting: Internal Medicine

## 2021-03-27 ENCOUNTER — Other Ambulatory Visit: Payer: Self-pay

## 2021-03-27 DIAGNOSIS — U071 COVID-19: Secondary | ICD-10-CM

## 2021-03-27 DIAGNOSIS — J1282 Pneumonia due to coronavirus disease 2019: Secondary | ICD-10-CM

## 2021-03-27 NOTE — Progress Notes (Signed)
Called and went over xray results per Dr Melvyn Novas with patient. All questions answered and patient expressed full understanding. Patient agreeable with Dr Gustavus Bryant recommendations. Order placed for HRCT per Dr Melvyn Novas. Patient aware someone will be calling to schedule this. Nothing further needed at this time.

## 2021-03-27 NOTE — Assessment & Plan Note (Signed)
Onset with covid pna Aug 2021  - desats on RA p 100 ft 02/09/2021 > titrate to sats > 90% using POC max flow of 6lpm   Again advised:  Make sure you check your oxygen saturation  at your highest level of activity  to be sure it stays over 90% and adjust  02 flow upward to maintain this level if needed but remember to turn it back to previous settings when you stop (to conserve your supply).          Each maintenance medication was reviewed in detail including emphasizing most importantly the difference between maintenance and prns and under what circumstances the prns are to be triggered using an action plan format where appropriate.  Total time for H and P, chart review, counseling, reviewing 02  device(s) and generating customized AVS unique to this office visit / same day charting > 30 min

## 2021-03-27 NOTE — Assessment & Plan Note (Signed)
See venous dopplers 08/09/20 pos bilateral dvt with echo 08/26/20 neg for elevated R sided pressures  - repeat venous dopplers 03/23/2021 ordered - if negative his only risk factor is obesity and could drop back to prophylactic dose until more mobile / losing wt vs d/c DOAC altogether (judgement call)

## 2021-03-27 NOTE — Assessment & Plan Note (Signed)
Onset of illness 07/22/20 c/b bilateral dvt and L PTX / 02 dep at d/c 09/28/20  -  02/09/2021   Walked RA  approx   100 ft  @ slow pace  stopped due to  desats to 87% corrected to 90% on 1lpm  POC x another 100 ft then desat again   - HRCT 03/27/2021 ordered - see cxr report/recs same day as ov  His esr is nl and risk of further wt gain from steroids so do not rec adding this - work or reconditioning/ wt loss and assure adequate 02 with ex (see separate a/p)

## 2021-03-29 ENCOUNTER — Ambulatory Visit (HOSPITAL_COMMUNITY)
Admission: RE | Admit: 2021-03-29 | Discharge: 2021-03-29 | Disposition: A | Discharge status: Home / Self Care | Payer: 59 | Source: Ambulatory Visit | Attending: Internal Medicine | Admitting: Internal Medicine

## 2021-03-29 DIAGNOSIS — I82403 Acute embolism and thrombosis of unspecified deep veins of lower extremity, bilateral: Secondary | ICD-10-CM | POA: Diagnosis not present

## 2021-04-04 ENCOUNTER — Emergency Department (HOSPITAL_COMMUNITY): Payer: 59

## 2021-04-04 ENCOUNTER — Emergency Department (HOSPITAL_COMMUNITY)
Admission: EM | Admit: 2021-04-04 | Discharge: 2021-04-05 | Disposition: A | Discharge status: Home / Self Care | Payer: 59 | Attending: Emergency Medicine | Admitting: Emergency Medicine

## 2021-04-04 ENCOUNTER — Other Ambulatory Visit: Payer: Self-pay

## 2021-04-04 ENCOUNTER — Encounter (HOSPITAL_COMMUNITY): Payer: Self-pay

## 2021-04-04 DIAGNOSIS — Z7984 Long term (current) use of oral hypoglycemic drugs: Secondary | ICD-10-CM | POA: Insufficient documentation

## 2021-04-04 DIAGNOSIS — Z79899 Other long term (current) drug therapy: Secondary | ICD-10-CM | POA: Diagnosis not present

## 2021-04-04 DIAGNOSIS — I10 Essential (primary) hypertension: Secondary | ICD-10-CM | POA: Diagnosis not present

## 2021-04-04 DIAGNOSIS — E785 Hyperlipidemia, unspecified: Secondary | ICD-10-CM | POA: Insufficient documentation

## 2021-04-04 DIAGNOSIS — R0781 Pleurodynia: Secondary | ICD-10-CM | POA: Diagnosis not present

## 2021-04-04 DIAGNOSIS — E1169 Type 2 diabetes mellitus with other specified complication: Secondary | ICD-10-CM | POA: Diagnosis not present

## 2021-04-04 DIAGNOSIS — R0602 Shortness of breath: Secondary | ICD-10-CM | POA: Insufficient documentation

## 2021-04-04 DIAGNOSIS — E119 Type 2 diabetes mellitus without complications: Secondary | ICD-10-CM | POA: Insufficient documentation

## 2021-04-04 DIAGNOSIS — Z8616 Personal history of COVID-19: Secondary | ICD-10-CM | POA: Insufficient documentation

## 2021-04-04 DIAGNOSIS — Z7901 Long term (current) use of anticoagulants: Secondary | ICD-10-CM | POA: Insufficient documentation

## 2021-04-04 DIAGNOSIS — Z794 Long term (current) use of insulin: Secondary | ICD-10-CM | POA: Diagnosis not present

## 2021-04-04 DIAGNOSIS — R1084 Generalized abdominal pain: Secondary | ICD-10-CM | POA: Diagnosis not present

## 2021-04-04 LAB — CBC WITH DIFFERENTIAL/PLATELET
Abs Immature Granulocytes: 0.03 10*3/uL (ref 0.00–0.07)
Basophils Absolute: 0 10*3/uL (ref 0.0–0.1)
Basophils Relative: 0 %
Eosinophils Absolute: 0 10*3/uL (ref 0.0–0.5)
Eosinophils Relative: 0 %
HCT: 45.1 % (ref 39.0–52.0)
Hemoglobin: 14.6 g/dL (ref 13.0–17.0)
Immature Granulocytes: 0 %
Lymphocytes Relative: 9 %
Lymphs Abs: 0.9 10*3/uL (ref 0.7–4.0)
MCH: 28 pg (ref 26.0–34.0)
MCHC: 32.4 g/dL (ref 30.0–36.0)
MCV: 86.6 fL (ref 80.0–100.0)
Monocytes Absolute: 0.3 10*3/uL (ref 0.1–1.0)
Monocytes Relative: 3 %
Neutro Abs: 8.2 10*3/uL — ABNORMAL HIGH (ref 1.7–7.7)
Neutrophils Relative %: 88 %
Platelets: 175 10*3/uL (ref 150–400)
RBC: 5.21 MIL/uL (ref 4.22–5.81)
RDW: 14.3 % (ref 11.5–15.5)
WBC: 9.4 10*3/uL (ref 4.0–10.5)
nRBC: 0 % (ref 0.0–0.2)

## 2021-04-04 LAB — BASIC METABOLIC PANEL
Anion gap: 10 (ref 5–15)
BUN: 22 mg/dL (ref 8–23)
CO2: 26 mmol/L (ref 22–32)
Calcium: 9.3 mg/dL (ref 8.9–10.3)
Chloride: 100 mmol/L (ref 98–111)
Creatinine, Ser: 0.93 mg/dL (ref 0.61–1.24)
GFR, Estimated: >60 mL/min (ref >60)
Glucose, Bld: 216 mg/dL — ABNORMAL HIGH (ref 70–99)
Potassium: 4.4 mmol/L (ref 3.5–5.1)
Sodium: 136 mmol/L (ref 135–145)

## 2021-04-04 LAB — TROPONIN I (HIGH SENSITIVITY)
Troponin I (High Sensitivity): 3 ng/L (ref <18)
Troponin I (High Sensitivity): 3 ng/L (ref <18)

## 2021-04-04 LAB — LACTIC ACID, PLASMA
Lactic Acid, Venous: 1.6 mmol/L (ref 0.5–1.9)
Lactic Acid, Venous: 1.5 mmol/L (ref 0.5–1.9)

## 2021-04-04 MED ORDER — SODIUM CHLORIDE 0.9 % IV BOLUS
500.0000 mL | Freq: Once | INTRAVENOUS | Status: AC
  Administered 2021-04-04: 500 mL via INTRAVENOUS

## 2021-04-04 MED ORDER — IOHEXOL 350 MG/ML SOLN
100.0000 mL | Freq: Once | INTRAVENOUS | Status: AC | PRN
  Administered 2021-04-04: 100 mL via INTRAVENOUS

## 2021-04-04 MED ORDER — MORPHINE SULFATE (PF) 4 MG/ML IV SOLN
4.0000 mg | Freq: Once | INTRAVENOUS | Status: AC
  Administered 2021-04-04: 4 mg via INTRAVENOUS
  Filled 2021-04-04: qty 1
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
## 2021-04-04 NOTE — ED Notes (Signed)
Pt stated he feels like he needs to have a BM but nothing is happening.

## 2021-04-04 NOTE — ED Triage Notes (Signed)
Pt BIB [facility] from home c/o bilateral rib pain that radiates into his chest and his back. Pt rates pain 7/10. Pt states the pain started about an hr ago.

## 2021-04-05 NOTE — Discharge Instructions (Addendum)
It was our pleasure to provide your ER care today - we hope that you feel better.  Your ct scan was read as showing no acute process - incidental note was made of: 1. 6 mm and 7 mm noncalcified right basilar lung nodules. Non-contrast chest CT at 3-6 months is recommended. If the nodules are stable at time of repeat CT, then future CT at 18-24 months (from today's scan) is considered optional for low-risk patients, but is recommended for high-risk patients. 2. Multiple bilateral simple renal cysts. 3. Sigmoid diverticulosis. 4. Enlarged prostate gland.  Discuss CT with your doctor at follow up.  Regarding the interstitial lung disease noted since your covid infection (including on today's imaging), as well as lung nodules, follow up with your pulmonary doctor as arranged with them.   If constipated, make sure to drink plenty of fluids, get adequate fiber in diet, take colace (stool softener) 2x/day, and  take miralax (laxative) once a day as need. Note that opiate-type pain meds can slow down your bowels/cause constipation - for pain, you may take acetaminophen or ibuprofen as need.   Return to ER if worse, new symptoms, fevers, new or severe pain, increased trouble breathing, persistent vomiting, or other concern.

## 2021-04-05 NOTE — ED Notes (Signed)
Pt discharged and wheeled out of the ED in a wheel chair without difficulty. 

## 2021-04-06 ENCOUNTER — Ambulatory Visit
Admission: EM | Admit: 2021-04-06 | Discharge: 2021-04-06 | Disposition: A | Discharge status: Home / Self Care | Payer: 59 | Attending: Emergency Medicine | Admitting: Emergency Medicine

## 2021-04-06 ENCOUNTER — Other Ambulatory Visit: Payer: Self-pay

## 2021-04-06 DIAGNOSIS — R1010 Upper abdominal pain, unspecified: Secondary | ICD-10-CM | POA: Diagnosis not present

## 2021-04-06 LAB — POCT URINALYSIS DIP (MANUAL ENTRY)
Glucose, UA: 100 mg/dL — AB
Leukocytes, UA: NEGATIVE
Nitrite, UA: NEGATIVE
Protein Ur, POC: 100 mg/dL — AB
Spec Grav, UA: >=1.03 — AB (ref 1.010–1.025)
Urobilinogen, UA: 0.2 E.U./dL
pH, UA: 5.5 (ref 5.0–8.0)

## 2021-04-06 MED ORDER — IBUPROFEN 600 MG PO TABS: 600.0000 mg | ORAL_TABLET | Freq: Four times a day (QID) | ORAL | 0 refills | Status: DC | PRN

## 2021-04-06 MED ORDER — ACETAMINOPHEN 325 MG PO TABS
650.0000 mg | ORAL_TABLET | Freq: Once | ORAL | Status: AC
  Administered 2021-04-06: 650 mg via ORAL

## 2021-04-06 MED ORDER — KETOROLAC TROMETHAMINE 30 MG/ML IJ SOLN
30.0000 mg | Freq: Once | INTRAMUSCULAR | Status: AC
  Administered 2021-04-06: 30 mg via INTRAMUSCULAR
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
## 2021-04-06 NOTE — ED Triage Notes (Signed)
Pt presents with complaints of lower abdominal pain since Wednesday. Reports it started in his ribs, chest, and back, and he went to the ER and had a CT scan done and they could not find anything wrong. Patient is now having lower abdominal pain. He feels like his stomach is "rolling" all of the time.

## 2021-04-06 NOTE — Discharge Instructions (Addendum)
You do not have a urinary tract infection.  I am checking your liver to make sure that it is okay.  I will call you only if your lab results come back abnormal.  This could be shingles, but it is impossible to make the diagnosis until rash shows up take 600 mg of ibuprofen with 1000 mg of Tylenol together 3-4 times a day as needed for pain.  Follow-up with your primary care provider and with your pulmonologist ASAP.  May return here if you have a rash and we can start you on Valtrex for shingles.

## 2021-04-07 LAB — COMPREHENSIVE METABOLIC PANEL
ALT: 27 IU/L (ref 0–44)
AST: 23 IU/L (ref 0–40)
Albumin/Globulin Ratio: 1.7 (ref 1.2–2.2)
Albumin: 4.4 g/dL (ref 3.8–4.8)
Alkaline Phosphatase: 55 IU/L (ref 44–121)
BUN/Creatinine Ratio: 16 (ref 10–24)
BUN: 14 mg/dL (ref 8–27)
Bilirubin Total: 0.7 mg/dL (ref 0.0–1.2)
CO2: 25 mmol/L (ref 20–29)
Calcium: 9.5 mg/dL (ref 8.6–10.2)
Chloride: 95 mmol/L — ABNORMAL LOW (ref 96–106)
Creatinine, Ser: 0.88 mg/dL (ref 0.76–1.27)
Globulin, Total: 2.6 g/dL (ref 1.5–4.5)
Glucose: 149 mg/dL — ABNORMAL HIGH (ref 65–99)
Potassium: 4.3 mmol/L (ref 3.5–5.2)
Sodium: 137 mmol/L (ref 134–144)
Total Protein: 7 g/dL (ref 6.0–8.5)
eGFR: 94 mL/min/{1.73_m2} (ref >59)

## 2021-04-09 ENCOUNTER — Emergency Department (HOSPITAL_COMMUNITY): Payer: 59

## 2021-04-09 ENCOUNTER — Other Ambulatory Visit: Payer: Self-pay

## 2021-04-09 ENCOUNTER — Encounter (HOSPITAL_COMMUNITY): Payer: Self-pay | Admitting: Emergency Medicine

## 2021-04-09 ENCOUNTER — Inpatient Hospital Stay (HOSPITAL_COMMUNITY): Payer: 59

## 2021-04-09 ENCOUNTER — Inpatient Hospital Stay: Admission: AD | Admit: 2021-04-09 | Payer: 59 | Admitting: Pulmonary Disease

## 2021-04-09 ENCOUNTER — Inpatient Hospital Stay (HOSPITAL_COMMUNITY)
Admission: EM | Admit: 2021-04-09 | Discharge: 2021-04-15 | DRG: 871 | Disposition: A | Discharge status: Home / Self Care | Payer: 59 | Attending: Internal Medicine | Admitting: Internal Medicine

## 2021-04-09 DIAGNOSIS — E11649 Type 2 diabetes mellitus with hypoglycemia without coma: Secondary | ICD-10-CM | POA: Diagnosis present

## 2021-04-09 DIAGNOSIS — E1165 Type 2 diabetes mellitus with hyperglycemia: Secondary | ICD-10-CM | POA: Diagnosis present

## 2021-04-09 DIAGNOSIS — K8000 Calculus of gallbladder with acute cholecystitis without obstruction: Secondary | ICD-10-CM | POA: Diagnosis present

## 2021-04-09 DIAGNOSIS — Z86718 Personal history of other venous thrombosis and embolism: Secondary | ICD-10-CM

## 2021-04-09 DIAGNOSIS — Q549 Hypospadias, unspecified: Secondary | ICD-10-CM

## 2021-04-09 DIAGNOSIS — Z794 Long term (current) use of insulin: Secondary | ICD-10-CM

## 2021-04-09 DIAGNOSIS — Z20822 Contact with and (suspected) exposure to covid-19: Secondary | ICD-10-CM | POA: Diagnosis present

## 2021-04-09 DIAGNOSIS — Z8249 Family history of ischemic heart disease and other diseases of the circulatory system: Secondary | ICD-10-CM

## 2021-04-09 DIAGNOSIS — Z9981 Dependence on supplemental oxygen: Secondary | ICD-10-CM | POA: Diagnosis not present

## 2021-04-09 DIAGNOSIS — R338 Other retention of urine: Secondary | ICD-10-CM | POA: Diagnosis present

## 2021-04-09 DIAGNOSIS — Z978 Presence of other specified devices: Secondary | ICD-10-CM

## 2021-04-09 DIAGNOSIS — I509 Heart failure, unspecified: Secondary | ICD-10-CM | POA: Diagnosis present

## 2021-04-09 DIAGNOSIS — J9621 Acute and chronic respiratory failure with hypoxia: Secondary | ICD-10-CM | POA: Diagnosis present

## 2021-04-09 DIAGNOSIS — R579 Shock, unspecified: Secondary | ICD-10-CM

## 2021-04-09 DIAGNOSIS — E119 Type 2 diabetes mellitus without complications: Secondary | ICD-10-CM

## 2021-04-09 DIAGNOSIS — Z833 Family history of diabetes mellitus: Secondary | ICD-10-CM

## 2021-04-09 DIAGNOSIS — I2510 Atherosclerotic heart disease of native coronary artery without angina pectoris: Secondary | ICD-10-CM | POA: Diagnosis present

## 2021-04-09 DIAGNOSIS — Z85820 Personal history of malignant melanoma of skin: Secondary | ICD-10-CM

## 2021-04-09 DIAGNOSIS — E871 Hypo-osmolality and hyponatremia: Secondary | ICD-10-CM | POA: Diagnosis present

## 2021-04-09 DIAGNOSIS — I10 Essential (primary) hypertension: Secondary | ICD-10-CM | POA: Diagnosis not present

## 2021-04-09 DIAGNOSIS — E1169 Type 2 diabetes mellitus with other specified complication: Secondary | ICD-10-CM

## 2021-04-09 DIAGNOSIS — E782 Mixed hyperlipidemia: Secondary | ICD-10-CM | POA: Diagnosis present

## 2021-04-09 DIAGNOSIS — A419 Sepsis, unspecified organism: Secondary | ICD-10-CM | POA: Diagnosis present

## 2021-04-09 DIAGNOSIS — R9431 Abnormal electrocardiogram [ECG] [EKG]: Secondary | ICD-10-CM | POA: Diagnosis not present

## 2021-04-09 DIAGNOSIS — I110 Hypertensive heart disease with heart failure: Secondary | ICD-10-CM | POA: Diagnosis present

## 2021-04-09 DIAGNOSIS — J9811 Atelectasis: Secondary | ICD-10-CM | POA: Diagnosis present

## 2021-04-09 DIAGNOSIS — R6521 Severe sepsis with septic shock: Secondary | ICD-10-CM | POA: Diagnosis present

## 2021-04-09 DIAGNOSIS — N401 Enlarged prostate with lower urinary tract symptoms: Secondary | ICD-10-CM | POA: Diagnosis present

## 2021-04-09 DIAGNOSIS — K219 Gastro-esophageal reflux disease without esophagitis: Secondary | ICD-10-CM | POA: Diagnosis present

## 2021-04-09 DIAGNOSIS — R7989 Other specified abnormal findings of blood chemistry: Secondary | ICD-10-CM

## 2021-04-09 DIAGNOSIS — J9611 Chronic respiratory failure with hypoxia: Secondary | ICD-10-CM | POA: Diagnosis not present

## 2021-04-09 DIAGNOSIS — R778 Other specified abnormalities of plasma proteins: Secondary | ICD-10-CM | POA: Diagnosis not present

## 2021-04-09 DIAGNOSIS — Z79899 Other long term (current) drug therapy: Secondary | ICD-10-CM

## 2021-04-09 DIAGNOSIS — K8020 Calculus of gallbladder without cholecystitis without obstruction: Secondary | ICD-10-CM

## 2021-04-09 DIAGNOSIS — R918 Other nonspecific abnormal finding of lung field: Secondary | ICD-10-CM

## 2021-04-09 DIAGNOSIS — J849 Interstitial pulmonary disease, unspecified: Secondary | ICD-10-CM | POA: Diagnosis present

## 2021-04-09 DIAGNOSIS — K810 Acute cholecystitis: Secondary | ICD-10-CM | POA: Diagnosis present

## 2021-04-09 DIAGNOSIS — I248 Other forms of acute ischemic heart disease: Secondary | ICD-10-CM | POA: Diagnosis present

## 2021-04-09 DIAGNOSIS — U099 Post covid-19 condition, unspecified: Secondary | ICD-10-CM | POA: Diagnosis present

## 2021-04-09 DIAGNOSIS — Z951 Presence of aortocoronary bypass graft: Secondary | ICD-10-CM

## 2021-04-09 DIAGNOSIS — I77810 Thoracic aortic ectasia: Secondary | ICD-10-CM | POA: Diagnosis present

## 2021-04-09 DIAGNOSIS — F5101 Primary insomnia: Secondary | ICD-10-CM | POA: Diagnosis present

## 2021-04-09 DIAGNOSIS — Z7984 Long term (current) use of oral hypoglycemic drugs: Secondary | ICD-10-CM

## 2021-04-09 DIAGNOSIS — Z01818 Encounter for other preprocedural examination: Secondary | ICD-10-CM

## 2021-04-09 LAB — BLOOD GAS, ARTERIAL
Acid-base deficit: 1.1 mmol/L (ref 0.0–2.0)
Bicarbonate: 23.3 mmol/L (ref 20.0–28.0)
FIO2: 100
O2 Saturation: 99.5 %
Patient temperature: 37
pCO2 arterial: 42.9 mmHg (ref 32.0–48.0)
pH, Arterial: 7.359 (ref 7.350–7.450)
pO2, Arterial: 484 mmHg — ABNORMAL HIGH (ref 83.0–108.0)

## 2021-04-09 LAB — COMPREHENSIVE METABOLIC PANEL
ALT: 19 U/L (ref 0–44)
AST: 22 U/L (ref 15–41)
Albumin: 3.1 g/dL — ABNORMAL LOW (ref 3.5–5.0)
Alkaline Phosphatase: 73 U/L (ref 38–126)
Anion gap: 11 (ref 5–15)
BUN: 28 mg/dL — ABNORMAL HIGH (ref 8–23)
CO2: 26 mmol/L (ref 22–32)
Calcium: 9.1 mg/dL (ref 8.9–10.3)
Chloride: 94 mmol/L — ABNORMAL LOW (ref 98–111)
Creatinine, Ser: 0.85 mg/dL (ref 0.61–1.24)
GFR, Estimated: >60 mL/min (ref >60)
Glucose, Bld: 209 mg/dL — ABNORMAL HIGH (ref 70–99)
Potassium: 3.9 mmol/L (ref 3.5–5.1)
Sodium: 131 mmol/L — ABNORMAL LOW (ref 135–145)
Total Bilirubin: 1 mg/dL (ref 0.3–1.2)
Total Protein: 7 g/dL (ref 6.5–8.1)

## 2021-04-09 LAB — RESP PANEL BY RT-PCR (FLU A&B, COVID) ARPGX2
Influenza A by PCR: NEGATIVE
Influenza B by PCR: NEGATIVE
SARS Coronavirus 2 by RT PCR: NEGATIVE

## 2021-04-09 LAB — CBC
HCT: 45.9 % (ref 39.0–52.0)
HCT: 43.2 % (ref 39.0–52.0)
Hemoglobin: 14.5 g/dL (ref 13.0–17.0)
Hemoglobin: 13.5 g/dL (ref 13.0–17.0)
MCH: 27.8 pg (ref 26.0–34.0)
MCH: 28.4 pg (ref 26.0–34.0)
MCHC: 31.6 g/dL (ref 30.0–36.0)
MCHC: 31.3 g/dL (ref 30.0–36.0)
MCV: 88.1 fL (ref 80.0–100.0)
MCV: 90.8 fL (ref 80.0–100.0)
Platelets: 273 10*3/uL (ref 150–400)
Platelets: 288 10*3/uL (ref 150–400)
RBC: 5.21 MIL/uL (ref 4.22–5.81)
RBC: 4.76 MIL/uL (ref 4.22–5.81)
RDW: 15 % (ref 11.5–15.5)
RDW: 15.1 % (ref 11.5–15.5)
WBC: 15.9 10*3/uL — ABNORMAL HIGH (ref 4.0–10.5)
WBC: 21.3 10*3/uL — ABNORMAL HIGH (ref 4.0–10.5)
nRBC: 0 % (ref 0.0–0.2)
nRBC: 0 % (ref 0.0–0.2)

## 2021-04-09 LAB — URINALYSIS, ROUTINE W REFLEX MICROSCOPIC
Bacteria, UA: NONE SEEN
Bilirubin Urine: NEGATIVE
Glucose, UA: >=500 mg/dL — AB
Hgb urine dipstick: NEGATIVE
Ketones, ur: 20 mg/dL — AB
Leukocytes,Ua: NEGATIVE
Nitrite: NEGATIVE
Protein, ur: 100 mg/dL — AB
Specific Gravity, Urine: 1.025 (ref 1.005–1.030)
pH: 6 (ref 5.0–8.0)

## 2021-04-09 LAB — CBC WITH DIFFERENTIAL/PLATELET
Abs Immature Granulocytes: 0.45 10*3/uL — ABNORMAL HIGH (ref 0.00–0.07)
Basophils Absolute: 0.1 10*3/uL (ref 0.0–0.1)
Basophils Relative: 1 %
Eosinophils Absolute: 0 10*3/uL (ref 0.0–0.5)
Eosinophils Relative: 0 %
HCT: 42 % (ref 39.0–52.0)
Hemoglobin: 13.1 g/dL (ref 13.0–17.0)
Immature Granulocytes: 2 %
Lymphocytes Relative: 6 %
Lymphs Abs: 1.3 10*3/uL (ref 0.7–4.0)
MCH: 27.7 pg (ref 26.0–34.0)
MCHC: 31.2 g/dL (ref 30.0–36.0)
MCV: 88.8 fL (ref 80.0–100.0)
Monocytes Absolute: 2.9 10*3/uL — ABNORMAL HIGH (ref 0.1–1.0)
Monocytes Relative: 12 %
Neutro Abs: 18.9 10*3/uL — ABNORMAL HIGH (ref 1.7–7.7)
Neutrophils Relative %: 79 %
Platelets: 334 10*3/uL (ref 150–400)
RBC: 4.73 MIL/uL (ref 4.22–5.81)
RDW: 15 % (ref 11.5–15.5)
WBC: 23.7 10*3/uL — ABNORMAL HIGH (ref 4.0–10.5)
nRBC: 0.1 % (ref 0.0–0.2)

## 2021-04-09 LAB — CREATININE, SERUM
Creatinine, Ser: 0.82 mg/dL (ref 0.61–1.24)
GFR, Estimated: >60 mL/min (ref >60)

## 2021-04-09 LAB — POCT I-STAT 7, (LYTES, BLD GAS, ICA,H+H)
Acid-Base Excess: 1 mmol/L (ref 0.0–2.0)
Bicarbonate: 27.7 mmol/L (ref 20.0–28.0)
Calcium, Ion: 1.2 mmol/L (ref 1.15–1.40)
HCT: 39 % (ref 39.0–52.0)
Hemoglobin: 13.3 g/dL (ref 13.0–17.0)
O2 Saturation: 100 %
Potassium: 4.1 mmol/L (ref 3.5–5.1)
Sodium: 131 mmol/L — ABNORMAL LOW (ref 135–145)
TCO2: 29 mmol/L (ref 22–32)
pCO2 arterial: 50.2 mmHg — ABNORMAL HIGH (ref 32.0–48.0)
pH, Arterial: 7.35 (ref 7.350–7.450)
pO2, Arterial: 317 mmHg — ABNORMAL HIGH (ref 83.0–108.0)

## 2021-04-09 LAB — HEMOGLOBIN A1C
Hgb A1c MFr Bld: 7.1 % — ABNORMAL HIGH (ref 4.8–5.6)
Mean Plasma Glucose: 157.07 mg/dL

## 2021-04-09 LAB — MAGNESIUM: Magnesium: 1.9 mg/dL (ref 1.7–2.4)

## 2021-04-09 LAB — GLUCOSE, CAPILLARY
Glucose-Capillary: 216 mg/dL — ABNORMAL HIGH (ref 70–99)
Glucose-Capillary: 276 mg/dL — ABNORMAL HIGH (ref 70–99)

## 2021-04-09 LAB — TROPONIN I (HIGH SENSITIVITY)
Troponin I (High Sensitivity): 157 ng/L (ref <18)
Troponin I (High Sensitivity): 148 ng/L (ref <18)

## 2021-04-09 LAB — CBG MONITORING, ED: Glucose-Capillary: 193 mg/dL — ABNORMAL HIGH (ref 70–99)

## 2021-04-09 LAB — LIPASE, BLOOD: Lipase: 25 U/L (ref 11–51)

## 2021-04-09 LAB — LACTIC ACID, PLASMA: Lactic Acid, Venous: 0.9 mmol/L (ref 0.5–1.9)

## 2021-04-09 LAB — BRAIN NATRIURETIC PEPTIDE: B Natriuretic Peptide: 348 pg/mL — ABNORMAL HIGH (ref 0.0–100.0)

## 2021-04-09 MED ORDER — CHLORHEXIDINE GLUCONATE CLOTH 2 % EX PADS
6.0000 | MEDICATED_PAD | Freq: Every day | CUTANEOUS | Status: DC
  Administered 2021-04-10 – 2021-04-14 (×6): 6 via TOPICAL

## 2021-04-09 MED ORDER — FENTANYL CITRATE (PF) 100 MCG/2ML IJ SOLN
INTRAMUSCULAR | Status: AC
  Filled 2021-04-09: qty 2

## 2021-04-09 MED ORDER — LACTATED RINGERS IV BOLUS (SEPSIS)
1000.0000 mL | Freq: Once | INTRAVENOUS | Status: AC
  Administered 2021-04-09: 1000 mL via INTRAVENOUS

## 2021-04-09 MED ORDER — SODIUM CHLORIDE 0.9 % IV SOLN
2.0000 g | Freq: Once | INTRAVENOUS | Status: AC
  Administered 2021-04-09: 2 g via INTRAVENOUS
  Filled 2021-04-09: qty 2

## 2021-04-09 MED ORDER — LIDOCAINE HCL 1 % IJ SOLN
INTRAMUSCULAR | Status: AC
  Filled 2021-04-09: qty 20

## 2021-04-09 MED ORDER — NOREPINEPHRINE 4 MG/250ML-% IV SOLN
2.0000 ug/min | INTRAVENOUS | Status: DC
  Administered 2021-04-09: 10 ug/min via INTRAVENOUS
  Administered 2021-04-10: 6 ug/min via INTRAVENOUS
  Administered 2021-04-10: 2.5 ug/min via INTRAVENOUS
  Filled 2021-04-09 (×2): qty 250

## 2021-04-09 MED ORDER — INSULIN ASPART 100 UNIT/ML IJ SOLN
0.0000 [IU] | INTRAMUSCULAR | Status: DC
Start: 2021-04-09 — End: 2021-04-15
  Administered 2021-04-09: 8 [IU] via SUBCUTANEOUS
  Administered 2021-04-09 – 2021-04-10 (×3): 3 [IU] via SUBCUTANEOUS
  Administered 2021-04-10: 2 [IU] via SUBCUTANEOUS
  Administered 2021-04-10 (×3): 3 [IU] via SUBCUTANEOUS
  Administered 2021-04-11 (×2): 5 [IU] via SUBCUTANEOUS
  Administered 2021-04-11: 3 [IU] via SUBCUTANEOUS
  Administered 2021-04-12: 5 [IU] via SUBCUTANEOUS
  Administered 2021-04-12: 2 [IU] via SUBCUTANEOUS
  Administered 2021-04-12 – 2021-04-13 (×3): 3 [IU] via SUBCUTANEOUS
  Administered 2021-04-13: 2 [IU] via SUBCUTANEOUS
  Administered 2021-04-13 (×2): 5 [IU] via SUBCUTANEOUS
  Administered 2021-04-14: 3 [IU] via SUBCUTANEOUS
  Administered 2021-04-14: 5 [IU] via SUBCUTANEOUS
  Administered 2021-04-14 (×2): 3 [IU] via SUBCUTANEOUS
  Administered 2021-04-14: 8 [IU] via SUBCUTANEOUS
  Administered 2021-04-14 – 2021-04-15 (×4): 3 [IU] via SUBCUTANEOUS
  Filled 2021-04-09: qty 1

## 2021-04-09 MED ORDER — PROPOFOL 1000 MG/100ML IV EMUL
5.0000 ug/kg/min | INTRAVENOUS | Status: DC
  Administered 2021-04-09: 10 ug/kg/min via INTRAVENOUS
  Filled 2021-04-09: qty 100

## 2021-04-09 MED ORDER — PANTOPRAZOLE SODIUM 40 MG IV SOLR
40.0000 mg | INTRAVENOUS | Status: DC
  Administered 2021-04-09 – 2021-04-10 (×2): 40 mg via INTRAVENOUS
  Filled 2021-04-09 (×2): qty 40

## 2021-04-09 MED ORDER — CEFTRIAXONE SODIUM 2 G IJ SOLR
2.0000 g | INTRAMUSCULAR | Status: DC
  Administered 2021-04-09 – 2021-04-14 (×6): 2 g via INTRAVENOUS
  Filled 2021-04-09 (×6): qty 20

## 2021-04-09 MED ORDER — DEXMEDETOMIDINE HCL IN NACL 400 MCG/100ML IV SOLN
0.0000 ug/kg/h | INTRAVENOUS | Status: DC
  Administered 2021-04-09: 0.4 ug/kg/h via INTRAVENOUS
  Administered 2021-04-10: 0.5 ug/kg/h via INTRAVENOUS
  Administered 2021-04-10: 0.8 ug/kg/h via INTRAVENOUS
  Administered 2021-04-11: 0.5 ug/kg/h via INTRAVENOUS
  Filled 2021-04-09 (×5): qty 100

## 2021-04-09 MED ORDER — MAGNESIUM SULFATE 2 GM/50ML IV SOLN
2.0000 g | Freq: Once | INTRAVENOUS | Status: AC
  Administered 2021-04-10: 2 g via INTRAVENOUS
  Filled 2021-04-09: qty 50

## 2021-04-09 MED ORDER — ONDANSETRON HCL 4 MG/2ML IJ SOLN: 4.0000 mg | Freq: Four times a day (QID) | INTRAMUSCULAR | Status: DC | PRN

## 2021-04-09 MED ORDER — ONDANSETRON HCL 4 MG/2ML IJ SOLN
4.0000 mg | Freq: Once | INTRAMUSCULAR | Status: AC
  Administered 2021-04-09: 4 mg via INTRAVENOUS
  Filled 2021-04-09: qty 2

## 2021-04-09 MED ORDER — SODIUM CHLORIDE 0.9 % IV BOLUS
1000.0000 mL | Freq: Once | INTRAVENOUS | Status: AC
  Administered 2021-04-09: 1000 mL via INTRAVENOUS

## 2021-04-09 MED ORDER — METRONIDAZOLE 500 MG/100ML IV SOLN
500.0000 mg | Freq: Three times a day (TID) | INTRAVENOUS | Status: DC
  Administered 2021-04-10 – 2021-04-15 (×17): 500 mg via INTRAVENOUS
  Filled 2021-04-09 (×17): qty 100

## 2021-04-09 MED ORDER — FENTANYL CITRATE (PF) 100 MCG/2ML IJ SOLN
25.0000 ug | INTRAMUSCULAR | Status: DC | PRN
  Administered 2021-04-10: 100 ug via INTRAVENOUS
  Filled 2021-04-09: qty 2

## 2021-04-09 MED ORDER — ACETAMINOPHEN 650 MG RE SUPP: 650.0000 mg | Freq: Four times a day (QID) | RECTAL | Status: DC | PRN

## 2021-04-09 MED ORDER — IOHEXOL 300 MG/ML  SOLN
100.0000 mL | Freq: Once | INTRAMUSCULAR | Status: AC | PRN
  Administered 2021-04-09: 100 mL via INTRAVENOUS

## 2021-04-09 MED ORDER — POLYETHYLENE GLYCOL 3350 17 G PO PACK: 17.0000 g | PACK | Freq: Every day | ORAL | Status: DC | PRN

## 2021-04-09 MED ORDER — DOCUSATE SODIUM 100 MG PO CAPS: 100.0000 mg | ORAL_CAPSULE | Freq: Two times a day (BID) | ORAL | Status: DC | PRN

## 2021-04-09 MED ORDER — CHLORHEXIDINE GLUCONATE 0.12% ORAL RINSE (MEDLINE KIT)
15.0000 mL | Freq: Two times a day (BID) | OROMUCOSAL | Status: DC
  Administered 2021-04-10 – 2021-04-12 (×6): 15 mL via OROMUCOSAL

## 2021-04-09 MED ORDER — MIDAZOLAM HCL 2 MG/2ML IJ SOLN
INTRAMUSCULAR | Status: AC
  Filled 2021-04-09: qty 2

## 2021-04-09 MED ORDER — LACTATED RINGERS IV BOLUS
500.0000 mL | Freq: Once | INTRAVENOUS | Status: AC
  Administered 2021-04-09: 500 mL via INTRAVENOUS

## 2021-04-09 MED ORDER — ONDANSETRON HCL 4 MG PO TABS: 4.0000 mg | ORAL_TABLET | Freq: Four times a day (QID) | ORAL | Status: DC | PRN

## 2021-04-09 MED ORDER — METRONIDAZOLE 500 MG/100ML IV SOLN
500.0000 mg | Freq: Once | INTRAVENOUS | Status: AC
  Administered 2021-04-09: 500 mg via INTRAVENOUS
  Filled 2021-04-09: qty 100

## 2021-04-09 MED ORDER — NOREPINEPHRINE 4 MG/250ML-% IV SOLN
0.0000 ug/min | INTRAVENOUS | Status: DC
  Administered 2021-04-09: 8 ug/min via INTRAVENOUS

## 2021-04-09 MED ORDER — HYDROMORPHONE HCL 1 MG/ML IJ SOLN
1.0000 mg | Freq: Once | INTRAMUSCULAR | Status: AC
  Administered 2021-04-09: 1 mg via INTRAVENOUS
  Filled 2021-04-09: qty 1

## 2021-04-09 MED ORDER — ENOXAPARIN SODIUM 40 MG/0.4ML IJ SOSY
40.0000 mg | PREFILLED_SYRINGE | INTRAMUSCULAR | Status: DC
  Administered 2021-04-09 – 2021-04-10 (×2): 40 mg via SUBCUTANEOUS
  Filled 2021-04-09 (×2): qty 0.4

## 2021-04-09 MED ORDER — ALPRAZOLAM 0.5 MG PO TABS: 0.2500 mg | ORAL_TABLET | Freq: Two times a day (BID) | ORAL | Status: DC | PRN

## 2021-04-09 MED ORDER — ORAL CARE MOUTH RINSE
15.0000 mL | OROMUCOSAL | Status: DC
  Administered 2021-04-09 – 2021-04-11 (×18): 15 mL via OROMUCOSAL

## 2021-04-09 MED ORDER — NALOXONE HCL 0.4 MG/ML IJ SOLN
INTRAMUSCULAR | Status: AC
  Filled 2021-04-09: qty 1

## 2021-04-09 MED ORDER — ACETAMINOPHEN 325 MG PO TABS
650.0000 mg | ORAL_TABLET | Freq: Four times a day (QID) | ORAL | Status: DC | PRN
  Administered 2021-04-10: 650 mg via ORAL
  Filled 2021-04-09: qty 2

## 2021-04-09 NOTE — Progress Notes (Signed)
Upon patient arriving in the room from the ED, patient was unresponsive to any kind of stimuli, including pain. When hooking patient up to the monitor, it showed patient's O2 sat's was 67% on 2L of oxygen which is patient's baseline. NRB placed on patient and ultimately after about 5 mins patients sat's came up to 90-94%. Patient was agonal breathing, very diaphoretic and had pinpoint pupils which were barely reactive to light.   Blood glucose checked and it was WNL, Temp and other vitals WNL.  Admitting MD paged to come assess patient, and decided to try Narcan first to see if it would reverse the effects of the Dilaudid, no effect from that. Ultimately, the decision was to intubate the patient. Multiple RN's at bedside, as well as 2 RT'S, admitting MD and ER MD who performed the intubation.

## 2021-04-09 NOTE — Progress Notes (Signed)
Pt in care of [facility] team at 2129. Report called to [facility]

## 2021-04-09 NOTE — Progress Notes (Signed)
Patient respiratory arrest. Unresponsive. Dr. Doristine Bosworth at bedside. Nursing staff at bedside, Respiratory at bedside. Respiratory had 2 failed attempts to intubate. ED doctor, Dr. Vanita Panda, to bedside to intubate.   Narcan given prior to intubation to reverse 1mg  of dilaudid given in ED. @ 1907. Ineffective.  Dr. Vanita Panda verbal order to give Etomidate 20mg  @ 1908 Roc 100mg  @ 1909 Prior to intubation  Patient intubated at 1910 with 7.5 ETT, 25 lip.  Chest xray ordered.

## 2021-04-09 NOTE — ED Notes (Signed)
Family member soda

## 2021-04-09 NOTE — ED Provider Notes (Signed)
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 Chest Portable 1 View  Result Date: 04/04/2021 CLINICAL DATA:  Bilateral rib pain radiating into the chest and for approximately 1 hour. No known injury. EXAM: PORTABLE CHEST 1 VIEW COMPARISON:  PA and lateral chest 03/23/2021 and single-view of the chest 08/29/2020. CT chest 09/17/2020. FINDINGS: Left upper lobe pulmonary nodule is unchanged. Lung volumes are low with left basilar atelectasis. No pneumothorax or pleural fluid. Cardiomegaly. No acute or focal bony abnormality. IMPRESSION: No acute disease. No change in a nodular opacity in the left upper lobe. Cardiomegaly. Electronically Signed  By: Inge Rise M.D.   On: 04/04/2021 17:45    EKG EKG Interpretation  Date/Time:  Monday Apr 09 2021 11:47:43 EDT Ventricular Rate:  116 PR Interval:  171 QRS Duration: 100 QT Interval:  290 QTC Calculation: 403 R Axis:   -81 Text  Interpretation: Sinus tachycardia Non-specific ST-t changes Confirmed by Lajean Saver [phone]) on 04/09/2021 11:55:23 AM   Radiology CT Abdomen Pelvis W Contrast  Result Date: 04/09/2021 CLINICAL DATA:  Right-sided abdominal pain for 10 days, shortness of breath EXAM: CT ABDOMEN AND PELVIS WITH CONTRAST TECHNIQUE: Multidetector CT imaging of the abdomen and pelvis was performed using the standard protocol following bolus administration of intravenous contrast. CONTRAST:  136m OMNIPAQUE IOHEXOL 300 MG/ML  SOLN COMPARISON:  CT abdomen pelvis, 04/04/2021 FINDINGS: Lower chest: No acute abnormality. Three-vessel coronary artery calcifications. Dependent bibasilar scarring and/or partial atelectasis. Hepatobiliary: No solid liver abnormality is seen. The gallbladder is mildly distended with gallbladder wall thickening and adjacent fat stranding. There are no radiopaque gallstones. No biliary ductal dilatation. Small pockets of subcapsular fluid adjacent to the gallbladder fossa (series 6, image 39, 62). Gallbladder wall thickening, or biliary dilatation. Pancreas: Unremarkable. No pancreatic ductal dilatation or surrounding inflammatory changes. Spleen: Normal in size without significant abnormality. Adrenals/Urinary Tract: Adrenal glands are unremarkable. Kidneys are normal, without renal calculi, solid lesion, or hydronephrosis. Bladder is unremarkable. Stomach/Bowel: Stomach is within normal limits. Appendix appears normal. No evidence of bowel wall thickening, distention, or inflammatory changes. Vascular/Lymphatic: Aortic atherosclerosis. No enlarged abdominal or pelvic lymph nodes. Reproductive: No mass or other significant abnormality. Other: No abdominal wall hernia or abnormality. No abdominopelvic ascites. Musculoskeletal: No acute or significant osseous findings. IMPRESSION: 1. The gallbladder is mildly distended with gallbladder wall thickening and adjacent fat stranding, concerning for acute  cholecystitis. 2. There are no radiopaque gallstones. No biliary ductal dilatation. 3. Small pockets of subcapsular fluid adjacent to the gallbladder fossa, possibly small volume ascites. The presence or absence of infection within this fluid is not established by CT. 4. Coronary artery disease. Aortic Atherosclerosis (ICD10-I70.0). Electronically Signed   By: AEddie CandleM.D.   On: 04/09/2021 13:43   [place]Abdomen Limited  Result Date: 04/09/2021 CLINICAL DATA:  Right abdominal pain EXAM: ULTRASOUND ABDOMEN LIMITED RIGHT UPPER QUADRANT COMPARISON:  None. FINDINGS: Gallbladder: No wall thickening visualized. Gallstones are present measuring up to 1.1 cm. Sludge is present. Small volume pericholecystic free fluid. No sonographic Murphy sign noted by sonographer. Common bile duct: Diameter: 5 mm, normal. There is possible echogenic material within the duct. Liver: No focal lesion identified. Increased parenchymal echogenicity. Portal vein is patent on color Doppler imaging with normal direction of blood flow towards the liver. Other: None. IMPRESSION: Gallstones and sludge. Small volume pericholecystic free fluid. Indeterminate for acute cholecystitis. Consider nuclear medicine study. Mildly increased liver echogenicity probably reflects steatosis. Electronically Signed   By: PMacy MisM.D.   On: 04/09/2021 14:59   DG Chest Port 1 View  Result Date: 04/09/2021 CLINICAL DATA:  Cough RIGHT-sided abdominal pain for 10 days. EXAM: PORTABLE CHEST 1 VIEW COMPARISON:  Apr 04, 2021. FINDINGS: Low lung volumes.  Diminished from previous imaging. Accounting for this cardiomediastinal contours are stable. Subtle opacity developing in the RIGHT mid chest and lower chest. No sign of effusion. Gastric distension similar to the prior study. On limited assessment no acute skeletal process. IMPRESSION: 1. Subtle opacity developing in the RIGHT mid and lower chest. Atelectasis versus developing infection. 2. Low lung volumes.  Electronically Signed  By: Zetta Bills M.D.   On: 04/09/2021 12:56    Procedures Procedures   Medications Ordered in ED Medications - No data to display  ED Course  I have reviewed the triage vital signs and the nursing notes.  Pertinent labs & imaging results that were available during my care of the patient were reviewed by me and considered in my medical decision making (see chart for details).    MDM Rules/Calculators/A&P                         Iv ns bolus. Stat labs.   Reviewed nursing notes and prior charts for additional history.  Recent CTA neg for PE. Recent CT abd neg acute.   Labs reviewed/interpreted by me - wbc is increased from prior.  Given worsening pain, increased wbc, abd tenderness, will repeat imaging.   CXR reviewed/interpreted by me - ?atelectasis vs infiltrate on right  CT reviewed/interpreted by me - ?fluid around gb. Will get u/s.  Additional labs reviewed/interpreted by me - trop is mildly high. Recheck pt - pt denies any chest pain or discomfort. ?possible demand related vs other, will trend trops.   Cultures sent. Lactate added. Iv ns bolus.   Iv antibiotics given.   U/s reviewed/interpreted by me - gallstones, ?cholecystitis.   Recheck, feels mildly improved w fluids/abx. Mild ruq/right mid abd pain. General surgery consulted, pt will need admission.  Dr Arnoldo Morale indicates he will see in consult in AM tomorrow, to admit this afternoon to hospitalist, ivf, iv antibiotics.   Hospitalists consulted for admission.          Final Clinical Impression(s) / ED Diagnoses Final diagnoses:  None    Rx / DC Orders ED Discharge Orders    None       Lajean Saver, MD 04/09/21 1558
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
## 2021-04-09 NOTE — ED Notes (Signed)
US in room currently.  
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
## 2021-04-09 NOTE — ED Triage Notes (Signed)
Pt c/o right abd pain x 10 days; pt also reports SOB; pt is on 2L [place]; HR in triage 123

## 2021-04-09 NOTE — Progress Notes (Addendum)
eLink Physician-Brief Progress Note Patient Name: Tim [name] DOB: Aug 26, 1951 MRN: [id number]   Date of Service  04/09/2021  HPI/Events of Note  69/M with chronic respiratory failure since COVID, with complaints of abdominal pain found to have acute gallstone cholecystitis, transferred from [facility] for cholecystostomy tube placement.    Pt was intubated after pt was given IV dilaudid and remained hypoxic despite narcan.  Pt was then transferred to [facility].   [place] - Gallstones and sludge. Small volume pericholecystic free fluid. Indeterminate for acute cholecystitis.  CT abdomen - mildly distended GB with GB wall thickening. ABG 7.359/42.9/484  Pt is able to follow simple commands. He is intubated and sedated.  eICU Interventions  Continue empiric antibiotics.  Continue with sedation.  Continue titrating FiO2.  Consult IR.  Lovenox for DVT prophylaxis.  Protonix for GI prophylaxis. Defer hypothermia.     Intervention Category Evaluation Type: New Patient Evaluation  Elsie Lincoln 04/09/2021, 10:18 PM

## 2021-04-09 NOTE — Progress Notes (Addendum)
Received a call at 1846 from patient's nurse about patient's labored breathing and requirement of nonrebreather on 15 L and yet he was not responding to painful stimuli.  Arrived at the bedside within 2 minutes.  Patient was a nonrebreather.  He was not responding to even with painful stimuli.  His GCS was roughly 7.  He needed to be intubated in order to protect his airway.  ED physician was called however he was busy with coding another patient in the ED.  Respiratory therapist tried to intubate but failed.  ED physician Dr. Vanita Panda then came and intubated the patient.  Patient's pulse and blood pressure was stable throughout.  After intubation, patient's blood pressure dropped.  He was started on Levophed.  Out of concern for possible worsening sepsis leading to septic shock which may lead to cardiac arrest due to untreated acute cholecystitis, I called Dr. Arnoldo Morale on his cell phone and discussed the whole case with him.  I requested him to see the patient and provide his opinion whether this patient needs urgent cholecystostomy drain placement or it can wait until morning.  Dr. Arnoldo Morale was an hour away and asked me to arrange transfer him to [facility], ICU for drain placement.  I initially paged ICU MD on-call, did not receive response so I called Montey Hora, PA with [facility] and he accepted the patient in transfer.  I called [facility] and provided all the information of the patient to them and also reiterated about the urgency of the need of transferring this patient to [facility] so he can get cholecystostomy drain placement as soon as possible.  I have sent a page to IR as well.  Waiting for callback.  Overall, I spent so far 60 minutes on the care of this patient. Critical care time spent 60 minutes  Addendum: d/w Dr. Dwaine Gale of IR.
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
## 2021-04-09 NOTE — Sedation Documentation (Signed)
Patient arrived to IR.

## 2021-04-09 NOTE — ED Notes (Signed)
Pt requesting to go to BR before EKG obtained; reports he is unable to wait; pt taken to BR and asked to obtain urine sample

## 2021-04-09 NOTE — ED Provider Notes (Signed)
7:40 PM Patient became apneic along after her admission to the ICU.  Some consideration of possible Dilaudid contributing to his condition he received Narcan.  This did not change his condition.  Prior to my arrival to the ICU the patient had received attempted intubation without meds, this was unsuccessful.  On my arrival to he was apneic, unresponsive, but was ventilated via bag-valve-mask without complication.  Subsequently, patient had RSI sedation, successful intubation, confirmed with audible breath sounds bilaterally.  INTUBATION Performed by: [name]  Required items: required blood products, implants, devices, and special equipment available Patient identity confirmed: provided demographic data and hospital-assigned identification number Time out: Immediately prior to procedure a "time out" was called to verify the correct patient, procedure, equipment, support staff and site/side marked as required.  Indications: airway protection  Intubation method: Glidescope Laryngoscopy   Preoxygenation: BVM  Sedatives: 20Etomidate Paralytic: 100 Rocuronium  Tube Size: 7.5 cuffed  Post-procedure assessment: chest rise and ETCO2 monitor Breath sounds: equal and absent over the epigastrium Tube secured with: ETT holder Chest x-ray interpreted by radiologist and me.  Chest x-ray findings: endotracheal tube in appropriate position  Patient tolerated the procedure well with no immediate complications.      [name], MD 04/09/21 [phone]

## 2021-04-10 ENCOUNTER — Inpatient Hospital Stay (HOSPITAL_COMMUNITY): Payer: 59

## 2021-04-10 DIAGNOSIS — K810 Acute cholecystitis: Secondary | ICD-10-CM | POA: Diagnosis not present

## 2021-04-10 DIAGNOSIS — R778 Other specified abnormalities of plasma proteins: Secondary | ICD-10-CM | POA: Diagnosis not present

## 2021-04-10 HISTORY — PX: IR PERC CHOLECYSTOSTOMY: IMG2326

## 2021-04-10 LAB — COMPREHENSIVE METABOLIC PANEL
ALT: 27 U/L (ref 0–44)
AST: 24 U/L (ref 15–41)
Albumin: 2 g/dL — ABNORMAL LOW (ref 3.5–5.0)
Alkaline Phosphatase: 67 U/L (ref 38–126)
Anion gap: 7 (ref 5–15)
BUN: 26 mg/dL — ABNORMAL HIGH (ref 8–23)
CO2: 26 mmol/L (ref 22–32)
Calcium: 8.5 mg/dL — ABNORMAL LOW (ref 8.9–10.3)
Chloride: 100 mmol/L (ref 98–111)
Creatinine, Ser: 0.8 mg/dL (ref 0.61–1.24)
GFR, Estimated: >60 mL/min (ref >60)
Glucose, Bld: 182 mg/dL — ABNORMAL HIGH (ref 70–99)
Potassium: 3.6 mmol/L (ref 3.5–5.1)
Sodium: 133 mmol/L — ABNORMAL LOW (ref 135–145)
Total Bilirubin: 0.6 mg/dL (ref 0.3–1.2)
Total Protein: 5.4 g/dL — ABNORMAL LOW (ref 6.5–8.1)

## 2021-04-10 LAB — PROTIME-INR
INR: 1.4 — ABNORMAL HIGH (ref 0.8–1.2)
Prothrombin Time: 16.8 seconds — ABNORMAL HIGH (ref 11.4–15.2)

## 2021-04-10 LAB — CBC
HCT: 39.4 % (ref 39.0–52.0)
Hemoglobin: 12.7 g/dL — ABNORMAL LOW (ref 13.0–17.0)
MCH: 27.8 pg (ref 26.0–34.0)
MCHC: 32.2 g/dL (ref 30.0–36.0)
MCV: 86.2 fL (ref 80.0–100.0)
Platelets: 296 10*3/uL (ref 150–400)
RBC: 4.57 MIL/uL (ref 4.22–5.81)
RDW: 15.1 % (ref 11.5–15.5)
WBC: 15.8 10*3/uL — ABNORMAL HIGH (ref 4.0–10.5)
nRBC: 0 % (ref 0.0–0.2)

## 2021-04-10 LAB — LIPID PANEL
Cholesterol: 91 mg/dL (ref 0–200)
HDL: 13 mg/dL — ABNORMAL LOW (ref >40)
LDL Cholesterol: 47 mg/dL (ref 0–99)
Total CHOL/HDL Ratio: 7 RATIO
Triglycerides: 156 mg/dL — ABNORMAL HIGH (ref <150)
VLDL: 31 mg/dL (ref 0–40)

## 2021-04-10 LAB — TROPONIN I (HIGH SENSITIVITY)
Troponin I (High Sensitivity): 119 ng/L (ref <18)
Troponin I (High Sensitivity): 89 ng/L — ABNORMAL HIGH (ref <18)
Troponin I (High Sensitivity): 60 ng/L — ABNORMAL HIGH (ref <18)
Troponin I (High Sensitivity): 60 ng/L — ABNORMAL HIGH (ref <18)

## 2021-04-10 LAB — MRSA PCR SCREENING
MRSA by PCR: NEGATIVE
MRSA by PCR: NEGATIVE

## 2021-04-10 LAB — LACTIC ACID, PLASMA: Lactic Acid, Venous: 1.4 mmol/L (ref 0.5–1.9)

## 2021-04-10 LAB — GLUCOSE, CAPILLARY
Glucose-Capillary: 189 mg/dL — ABNORMAL HIGH (ref 70–99)
Glucose-Capillary: 154 mg/dL — ABNORMAL HIGH (ref 70–99)
Glucose-Capillary: 168 mg/dL — ABNORMAL HIGH (ref 70–99)
Glucose-Capillary: 139 mg/dL — ABNORMAL HIGH (ref 70–99)
Glucose-Capillary: 153 mg/dL — ABNORMAL HIGH (ref 70–99)
Glucose-Capillary: 184 mg/dL — ABNORMAL HIGH (ref 70–99)

## 2021-04-10 LAB — BASIC METABOLIC PANEL
Anion gap: 12 (ref 5–15)
BUN: 29 mg/dL — ABNORMAL HIGH (ref 8–23)
CO2: 23 mmol/L (ref 22–32)
Calcium: 8.5 mg/dL — ABNORMAL LOW (ref 8.9–10.3)
Chloride: 96 mmol/L — ABNORMAL LOW (ref 98–111)
Creatinine, Ser: 0.94 mg/dL (ref 0.61–1.24)
GFR, Estimated: >60 mL/min (ref >60)
Glucose, Bld: 254 mg/dL — ABNORMAL HIGH (ref 70–99)
Potassium: 4.6 mmol/L (ref 3.5–5.1)
Sodium: 131 mmol/L — ABNORMAL LOW (ref 135–145)

## 2021-04-10 LAB — CORTISOL-AM, BLOOD: Cortisol - AM: 19.4 ug/dL (ref 6.7–22.6)

## 2021-04-10 LAB — TRIGLYCERIDES: Triglycerides: 146 mg/dL (ref <150)

## 2021-04-10 LAB — MAGNESIUM: Magnesium: 2.3 mg/dL (ref 1.7–2.4)

## 2021-04-10 LAB — PROCALCITONIN: Procalcitonin: 2.39 ng/mL

## 2021-04-10 MED ORDER — DOCUSATE SODIUM 50 MG/5ML PO LIQD: 100.0000 mg | Freq: Two times a day (BID) | ORAL | Status: DC | PRN

## 2021-04-10 MED ORDER — ACETAMINOPHEN 325 MG PO TABS: 650.0000 mg | ORAL_TABLET | Freq: Four times a day (QID) | ORAL | Status: DC | PRN

## 2021-04-10 MED ORDER — PROSOURCE TF PO LIQD
45.0000 mL | Freq: Every day | ORAL | Status: DC
  Administered 2021-04-10 – 2021-04-11 (×2): 45 mL
  Filled 2021-04-10 (×2): qty 45

## 2021-04-10 MED ORDER — SODIUM CHLORIDE 0.9% FLUSH
5.0000 mL | Freq: Three times a day (TID) | INTRAVENOUS | Status: DC
  Administered 2021-04-10 – 2021-04-15 (×15): 5 mL

## 2021-04-10 MED ORDER — FENTANYL 2500MCG IN NS 250ML (10MCG/ML) PREMIX INFUSION
25.0000 ug/h | INTRAVENOUS | Status: DC
Start: 2021-04-10 — End: 2021-04-11
  Administered 2021-04-10: 25 ug/h via INTRAVENOUS
  Filled 2021-04-10: qty 250

## 2021-04-10 MED ORDER — ROSUVASTATIN CALCIUM 5 MG PO TABS
5.0000 mg | ORAL_TABLET | Freq: Every day | ORAL | Status: DC
  Administered 2021-04-10 – 2021-04-11 (×2): 5 mg
  Filled 2021-04-10 (×2): qty 1

## 2021-04-10 MED ORDER — LACTATED RINGERS IV SOLN: INTRAVENOUS | Status: DC

## 2021-04-10 MED ORDER — POTASSIUM CHLORIDE 20 MEQ PO PACK
40.0000 meq | PACK | Freq: Once | ORAL | Status: AC
  Administered 2021-04-10: 40 meq
  Filled 2021-04-10: qty 2

## 2021-04-10 MED ORDER — IOHEXOL 300 MG/ML  SOLN
50.0000 mL | Freq: Once | INTRAMUSCULAR | Status: AC | PRN
  Administered 2021-04-10: 4 mL

## 2021-04-10 MED ORDER — LACTATED RINGERS IV BOLUS
500.0000 mL | Freq: Once | INTRAVENOUS | Status: AC
  Administered 2021-04-10: 500 mL via INTRAVENOUS

## 2021-04-10 MED ORDER — POLYETHYLENE GLYCOL 3350 17 G PO PACK: 17.0000 g | PACK | Freq: Every day | ORAL | Status: DC | PRN

## 2021-04-10 MED ORDER — ASPIRIN 81 MG PO CHEW
81.0000 mg | CHEWABLE_TABLET | Freq: Every day | ORAL | Status: DC
  Administered 2021-04-10 – 2021-04-11 (×2): 81 mg via NASOGASTRIC
  Filled 2021-04-10 (×2): qty 1

## 2021-04-10 MED ORDER — INSULIN GLARGINE 100 UNIT/ML ~~LOC~~ SOLN
15.0000 [IU] | Freq: Every day | SUBCUTANEOUS | Status: DC
  Administered 2021-04-10: 15 [IU] via SUBCUTANEOUS
  Filled 2021-04-10 (×2): qty 0.15

## 2021-04-10 MED ORDER — ACETAMINOPHEN 650 MG RE SUPP: 650.0000 mg | Freq: Four times a day (QID) | RECTAL | Status: DC | PRN

## 2021-04-10 MED ORDER — ONDANSETRON HCL 4 MG/2ML IJ SOLN: 4.0000 mg | Freq: Four times a day (QID) | INTRAMUSCULAR | Status: DC | PRN

## 2021-04-10 MED ORDER — VITAL AF 1.2 CAL PO LIQD
1000.0000 mL | ORAL | Status: DC
  Administered 2021-04-10: 1000 mL

## 2021-04-10 MED ORDER — ONDANSETRON HCL 4 MG PO TABS: 4.0000 mg | ORAL_TABLET | Freq: Four times a day (QID) | ORAL | Status: DC | PRN

## 2021-04-10 MED ORDER — FENTANYL BOLUS VIA INFUSION
25.0000 ug | INTRAVENOUS | Status: DC | PRN
  Filled 2021-04-10: qty 100

## 2021-04-10 MED FILL — Fentanyl Citrate Preservative Free (PF) Inj 100 MCG/2ML: INTRAMUSCULAR | Qty: 2 | Status: AC

## 2021-04-10 NOTE — Progress Notes (Signed)
Pharmacy Electrolyte Replacement  Recent Labs:  Recent Labs    04/10/21 0455  K 3.6  MG 2.3  CREATININE 0.80    Low Critical Values (K </= 2.5, Phos </= 1, Mg </= 1) Present: None  MD Contacted: N/a  Plan: KCl 41mEq per tube x1 today.

## 2021-04-10 NOTE — Progress Notes (Signed)
Patient transported from 2M08 to IR and back with no complications.

## 2021-04-10 NOTE — Progress Notes (Addendum)
eLink Physician-Brief Progress Note Patient Name: [facility] DOB: 1951/03/21 MRN: [id number]   Date of Service  04/10/2021  HPI/Events of Note  Notified of urinary obstruction with 675cc of urine on bladder scan. RN unable to insert foley due to hypospadia.  eICU Interventions  RN from urology to insert coude catheter.     Intervention Category Intermediate Interventions: Other:  Elsie Lincoln 04/10/2021, 2:37 AM   3:15 AM Dr. Tresa Moore from Urology contacted to insert foley catheter.

## 2021-04-10 NOTE — Progress Notes (Signed)
Referring Physician(s): Hillery Aldo, NP  Supervising Physician: Sandi Mariscal  Patient Status:  [facility] - In-pt  Chief Complaint: Acute cholecystitis s/p percutaneous cholecystostomy 04/09/21  Subjective: Patient transferred from  [facility] for respiratory failure and acute cholecystitis. Patient is currently intubated/sedated. Percutaneous cholecystostomy site is unremarkable.   Allergies: Patient has no known allergies.  Medications: Prior to Admission medications   Medication Sig Start Date End Date Taking? Authorizing Provider  albuterol (VENTOLIN HFA) 108 (90 Base) MCG/ACT inhaler Inhale 2 puffs into the lungs every 6 (six) hours as needed for wheezing or shortness of breath. 09/28/20  Yes Oswald Hillock, MD  alclomethasone (ACLOVATE) 0.05 % cream Apply topically 2 (two) times daily as needed (Rash). 03/13/21  Yes Sheffield, Ronalee Red, PA-C  ALPRAZolam (XANAX) 0.25 MG tablet Take 0.25 mg by mouth 2 (two) times daily as needed for anxiety.   Yes [provider]  alum & mag hydroxide-simeth (MAALOX/MYLANTA) 200-200-20 MG/5ML suspension Take 30 mLs by mouth every 4 (four) hours as needed for indigestion. 09/28/20  Yes Oswald Hillock, MD  ascorbic acid (VITAMIN C) 500 MG tablet Take 1 tablet (500 mg total) by mouth daily. 08/04/20  Yes Johnson, Clanford L, MD  Cholecalciferol (VITAMIN D3) 50 MCG (2000 UT) TABS Take 1 tablet by mouth daily.   Yes [provider]  co-enzyme Q-10 50 MG capsule Take 50 mg by mouth 2 (two) times daily.   Yes [provider]  famotidine (PEPCID) 20 MG tablet Take 20 mg by mouth daily. 03/13/21  Yes [provider]  FIASP FLEXTOUCH 100 UNIT/ML FlexTouch Pen Inject 1-3 Units into the skin every evening. 02/26/21  Yes [provider]  furosemide (LASIX) 20 MG tablet Take 20 mg by mouth daily. 03/28/21  Yes [provider]  ibuprofen (ADVIL) 600 MG tablet Take 1 tablet (600 mg total) by mouth every 6 (six) hours as  needed. 04/06/21  Yes Melynda Ripple, MD  JANUVIA 100 MG tablet Take 100 mg by mouth daily. 03/13/21  Yes [provider]  ketoconazole (NIZORAL) 2 % cream Apply 1 application topically 2 (two) times daily. 03/13/21 04/24/21 Yes Sheffield, Ronalee Red, PA-C  metFORMIN (GLUCOPHAGE) 1000 MG tablet Take 1,000 mg by mouth 2 (two) times daily with a meal.   Yes [provider]  Multiple Vitamin (MULTIVITAMIN) capsule Take 1 capsule by mouth daily.   Yes [provider]  olmesartan-hydrochlorothiazide (BENICAR HCT) 20-12.5 MG tablet Take 1 tablet by mouth daily. 03/05/21  Yes [provider]  rosuvastatin (CRESTOR) 5 MG tablet Take 5 mg by mouth daily. 03/05/21  Yes [provider]  sildenafil (VIAGRA) 100 MG tablet Take 100 mg by mouth at bedtime. 02/26/21  Yes [provider]  TOUJEO MAX SOLOSTAR 300 UNIT/ML Solostar Pen Inject 30 Units into the skin in the morning. 02/26/21  Yes [provider]  zinc sulfate 220 (50 Zn) MG capsule Take 1 capsule (220 mg total) by mouth daily. 08/04/20  Yes Johnson, Clanford L, MD  zolpidem (AMBIEN) 10 MG tablet Take 10 mg by mouth at bedtime as needed. 02/27/21  Yes [provider]  ACCU-CHEK GUIDE test strip USE TO TEST TWICE DAILY.D 02/06/21   [provider]  blood glucose meter kit and supplies KIT Dispense based on patient and insurance preference. Use up to four times daily as directed. (FOR ICD-9 250.00, 250.01). 08/14/20   Little Ishikawa, MD  insulin aspart (NOVOLOG) 100 UNIT/ML injection Inject 0-15 Units into  the skin 3 (three) times daily with meals. Sliding scale insulin Less than 70 initiate hypoglycemia protocol 70-120 - 0 units,  120-150-  2 units,  151-200 -3 units,  201-250- 5 units,  251-300- 8 units,  301-350- 11 units,  351-400- 15 units Greater than 400 call MD Patient not taking: No sig reported 09/28/20   Oswald Hillock, MD  Insulin Pen Needle (PEN NEEDLES) 32G X 4 MM MISC 1  Package by Does not apply route 4 (four) times daily -  before meals and at bedtime. 08/14/20   Little Ishikawa, MD  ondansetron (ZOFRAN) 4 MG tablet Take 1 tablet (4 mg total) by mouth every 6 (six) hours as needed for nausea. Patient not taking: Reported on 04/09/2021 09/28/20   Oswald Hillock, MD  pantoprazole (PROTONIX) 40 MG tablet Take 40 mg by mouth daily. Patient not taking: Reported on 04/09/2021    [provider]     Vital Signs: BP 127/86   Pulse 70   Temp 99.5 F (37.5 C) (Oral)   Resp 16   Ht '5\' 10"'  (1.778 m)   Wt 201 lb 1 oz (91.2 kg)   SpO2 100%   BMI 28.85 kg/m   Physical Exam Constitutional:      Comments: Intubated/sedated.   Cardiovascular:     Rate and Rhythm: Normal rate and regular rhythm.     Comments: Cardiac monitor Pulmonary:     Comments: Intubated/sedated Abdominal:     Comments: RUQ drain to gravity. Approximately 15 ml of serosanguineous fluid in bag. Drain easily flushed and aspirated. Dressing is clean/dry.   Skin:    General: Skin is warm and dry.     Imaging: CT Abdomen Pelvis W Contrast  Result Date: 04/09/2021 CLINICAL DATA:  Right-sided abdominal pain for 10 days, shortness of breath EXAM: CT ABDOMEN AND PELVIS WITH CONTRAST TECHNIQUE: Multidetector CT imaging of the abdomen and pelvis was performed using the standard protocol following bolus administration of intravenous contrast. CONTRAST:  134m OMNIPAQUE IOHEXOL 300 MG/ML  SOLN COMPARISON:  CT abdomen pelvis, 04/04/2021 FINDINGS: Lower chest: No acute abnormality. Three-vessel coronary artery calcifications. Dependent bibasilar scarring and/or partial atelectasis. Hepatobiliary: No solid liver abnormality is seen. The gallbladder is mildly distended with gallbladder wall thickening and adjacent fat stranding. There are no radiopaque gallstones. No biliary ductal dilatation. Small pockets of subcapsular fluid adjacent to the gallbladder fossa (series 6, image 39, 62). Gallbladder  wall thickening, or biliary dilatation. Pancreas: Unremarkable. No pancreatic ductal dilatation or surrounding inflammatory changes. Spleen: Normal in size without significant abnormality. Adrenals/Urinary Tract: Adrenal glands are unremarkable. Kidneys are normal, without renal calculi, solid lesion, or hydronephrosis. Bladder is unremarkable. Stomach/Bowel: Stomach is within normal limits. Appendix appears normal. No evidence of bowel wall thickening, distention, or inflammatory changes. Vascular/Lymphatic: Aortic atherosclerosis. No enlarged abdominal or pelvic lymph nodes. Reproductive: No mass or other significant abnormality. Other: No abdominal wall hernia or abnormality. No abdominopelvic ascites. Musculoskeletal: No acute or significant osseous findings. IMPRESSION: 1. The gallbladder is mildly distended with gallbladder wall thickening and adjacent fat stranding, concerning for acute cholecystitis. 2. There are no radiopaque gallstones. No biliary ductal dilatation. 3. Small pockets of subcapsular fluid adjacent to the gallbladder fossa, possibly small volume ascites. The presence or absence of infection within this fluid is not established by CT. 4. Coronary artery disease. Aortic Atherosclerosis (ICD10-I70.0). Electronically Signed   By: AEddie CandleM.D.   On: 04/09/2021 13:43   [place]Abdomen Limited  Result Date:  04/09/2021 CLINICAL DATA:  Right abdominal pain EXAM: ULTRASOUND ABDOMEN LIMITED RIGHT UPPER QUADRANT COMPARISON:  None. FINDINGS: Gallbladder: No wall thickening visualized. Gallstones are present measuring up to 1.1 cm. Sludge is present. Small volume pericholecystic free fluid. No sonographic Murphy sign noted by sonographer. Common bile duct: Diameter: 5 mm, normal. There is possible echogenic material within the duct. Liver: No focal lesion identified. Increased parenchymal echogenicity. Portal vein is patent on color Doppler imaging with normal direction of blood flow towards the liver.  Other: None. IMPRESSION: Gallstones and sludge. Small volume pericholecystic free fluid. Indeterminate for acute cholecystitis. Consider nuclear medicine study. Mildly increased liver echogenicity probably reflects steatosis. Electronically Signed   By: Macy Mis M.D.   On: 04/09/2021 14:59   IR Perc Cholecystostomy  Result Date: 04/10/2021 INDICATION: 70 year old gentleman presented to the ED with complaints of abdominal pain. Imaging findings suspicious for cholecystitis. EXAM: Ultrasound and fluoroscopic guided cholecystostomy drain placement MEDICATIONS: Ceftriaxone 2 g IV; The antibiotic was administered within an appropriate time frame prior to the initiation of the procedure. ANESTHESIA/SEDATION: None FLUOROSCOPY TIME:  Fluoroscopy Time: 0 minutes 36 seconds (8 mGy). COMPLICATIONS: None immediate. PROCEDURE: Informed written consent was obtained from the patient's wife after a thorough discussion of the procedural risks, benefits and alternatives. All questions were addressed. Maximal Sterile Barrier Technique was utilized including caps, mask, sterile gowns, sterile gloves, sterile drape, hand hygiene and skin antiseptic. A timeout was performed prior to the initiation of the procedure. Patient positioned supine on the procedure table. Right upper quadrant skin prepped and draped in usual fashion. Sterile ultrasound probe cover and gel utilized throughout the procedure. Following local lidocaine administration, 19 gauge Yueh needle advanced into the gallbladder lumen utilizing continuous ultrasound guidance. Yueh catheter exchanged for 10.2 [place] multipurpose pigtail drain over 0.035 inch guidewire. 10 mL of dark brown bile aspirated and sent for Gram stain and culture. Drain secured to skin with suture and connected to bag. IMPRESSION: 10.2 French cholecystostomy drain placed utilizing fluoroscopic and ultrasound guidance. Electronically Signed   By: Miachel Roux M.D.   On: 04/10/2021 09:26   DG  CHEST PORT 1 VIEW  Result Date: 04/10/2021 CLINICAL DATA:  70 year old male endotracheal tube adjusted. EXAM: PORTABLE CHEST 1 VIEW COMPARISON:  0123 hours today and earlier. FINDINGS: Portable AP semi upright view at 0307 hours. Endotracheal tube tip now projects at the level the clavicles, 2-3 cm above the carina allowing for somewhat more kyphotic positioning. Enteric tube courses to the abdomen, tip not included. Continued low lung volumes with stable ventilation. Stable cardiac size and mediastinal contours. No pneumothorax or pleural effusion is evident. IMPRESSION: 1. Endotracheal tube tip satisfactory at the level the clavicles. Enteric tube courses to the abdomen, tip not included. 2. Continued low lung volumes with stable ventilation. Electronically Signed   By: Genevie Ann M.D.   On: 04/10/2021 04:04   DG CHEST PORT 1 VIEW  Result Date: 04/10/2021 CLINICAL DATA:  70 year old male status post intubation EXAM: PORTABLE CHEST 1 VIEW COMPARISON:  Earlier radiograph dated 04/09/2021. FINDINGS: Endotracheal tube with tip at the level of the carina. Recommend retraction by approximately 4 cm for optimal positioning. Enteric tube extends below the diaphragm with side-port in the proximal stomach and tip beyond the inferior margin of the image. Shallow inspiration. No significant interval change in bilateral streaky pulmonary densities, left greater right. No pleural effusion pneumothorax. Stable cardiomediastinal silhouette. No acute osseous pathology. IMPRESSION: 1. Endotracheal tube with tip at the level of the  carina. Recommend retraction by approximately 4 cm for optimal positioning. 2. No significant interval change in the bilateral pulmonary densities. Electronically Signed   By: Anner Crete M.D.   On: 04/10/2021 01:39   DG Chest Port 1 View  Result Date: 04/09/2021 CLINICAL DATA:  Check endotracheal tube placement EXAM: PORTABLE CHEST 1 VIEW COMPARISON:  Film from earlier in the same day.  FINDINGS: Cardiac shadow is stable. Endotracheal tube is noted at the level of the carina. This could be withdrawn 1-2 cm. Gastric catheter is noted within the stomach overall inspiratory effort is poor with bibasilar atelectasis and chronic fibrotic changes. IMPRESSION: New bibasilar atelectatic changes. Tubes and lines as described above. The endotracheal tube could be withdrawn 1-2 cm. Electronically Signed   By: Inez Catalina M.D.   On: 04/09/2021 22:54   DG CHEST PORT 1 VIEW  Result Date: 04/09/2021 CLINICAL DATA:  Intubated EXAM: PORTABLE CHEST 1 VIEW COMPARISON:  04/09/2021, CT 04/04/2021, 08/29/2020 FINDINGS: Interval intubation, tip of endotracheal tube is at the carina. Low lung volumes with coarse slightly reticular opacities, likely due to chronic interstitial disease. Redemonstrated left upper lung nodule. Stable cardiomediastinal silhouette. No pneumothorax. Interim placement of esophageal tube, the side port projects over the mid gastric region. Lucency in the left upper quadrant presumably represents air distended dilated bowel. IMPRESSION: 1. Endotracheal tube tip at the carina 2. Esophageal tube side-port overlies the mid gastric region 3. Lucency in the left upper quadrant probably represents air dilated bowel, suggest dedicated abdominal radiograph for further evaluation These results will be called to the ordering clinician or representative by the Radiologist Assistant, and communication documented in the PACS or [employer]. Electronically Signed   By: Donavan Foil M.D.   On: 04/09/2021 19:38   DG Chest Port 1 View  Result Date: 04/09/2021 CLINICAL DATA:  Cough RIGHT-sided abdominal pain for 10 days. EXAM: PORTABLE CHEST 1 VIEW COMPARISON:  Apr 04, 2021. FINDINGS: Low lung volumes.  Diminished from previous imaging. Accounting for this cardiomediastinal contours are stable. Subtle opacity developing in the RIGHT mid chest and lower chest. No sign of effusion. Gastric distension  similar to the prior study. On limited assessment no acute skeletal process. IMPRESSION: 1. Subtle opacity developing in the RIGHT mid and lower chest. Atelectasis versus developing infection. 2. Low lung volumes. Electronically Signed   By: Zetta Bills M.D.   On: 04/09/2021 12:56    Labs:  CBC: Recent Labs    04/09/21 1153 04/09/21 1608 04/09/21 2235 04/09/21 2322 04/10/21 0455  WBC 15.9* 21.3* 23.7*  --  15.8*  HGB 14.5 13.5 13.1 13.3 12.7*  HCT 45.9 43.2 42.0 39.0 39.4  PLT 273 288 334  --  296    COAGS: Recent Labs    08/23/20 0400 08/23/20 1719 08/24/20 0423 08/25/20 0533 04/10/21 0455  INR  --   --   --   --  1.4*  APTT 84* 67* 77* 91*  --     BMP: Recent Labs    09/02/20 1635 09/03/20 0220 09/04/20 0247 09/05/20 0321 09/06/20 0202 04/06/21 1450 04/09/21 1153 04/09/21 1608 04/09/21 2235 04/09/21 2322 04/10/21 0455  NA 131* 131* 131* 131*   < > 137 131*  --  131* 131* 133*  K 5.0 5.0 4.8 5.0   < > 4.3 3.9  --  4.6 4.1 3.6  CL 92* 91* 93* 94*   < > 95* 94*  --  96*  --  100  CO2 32 31 30  28   < > 25 26  --  23  --  26  GLUCOSE 145* 230* 225* 159*   < > 149* 209*  --  254*  --  182*  BUN '22 21 23 21   ' < > 14 28*  --  29*  --  26*  CALCIUM 8.3* 8.4* 8.0* 8.3*   < > 9.5 9.1  --  8.5*  --  8.5*  CREATININE 0.52* 0.66 0.68 0.54*   < > 0.88 0.85 0.82 0.94  --  0.80  GFRNONAA >60 >60 >60 >60   < >  --  >60 >60 >60  --  >60  GFRAA >60 >60 >60 >60  --   --   --   --   --   --   --    < > = values in this interval not displayed.    LIVER FUNCTION TESTS: Recent Labs    11/07/20 1336 04/06/21 1450 04/09/21 1153 04/10/21 0455  BILITOT 0.6 0.7 1.0 0.6  AST '19 23 22 24  ' ALT '26 27 19 27  ' ALKPHOS 54 55 73 67  PROT 6.8 7.0 7.0 5.4*  ALBUMIN 3.7 4.4 3.1* 2.0*    Assessment and Plan:  Acute cholecystitis s/p percutaneous cholecystostomy 04/09/21: WBC is down to 15.8. Output for the past 24 hours is 100 ml with another 15 ml of serosanguineous fluid in  gravity bag. Drain easily flushed/aspirated. Patient requiring Levophed for pressure support. Per critical care note Surgery to be consulted for possible cholecystostomy once patient is stable.   IR recommends to continue flushing the drain and documenting the output each shift. Change the dressing daily or as needed. Other plans per critical care/primary teams. IR will continue to follow.   Electronically Signed: Soyla Dryer, AGACNP-BC [phone] 04/10/2021, 4:27 PM   I spent a total of 15 Minutes at the the patient's bedside AND on the patient's hospital floor or unit, greater than 50% of which was counseling/coordinating care for percutaneous cholecystostomy drain.
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
## 2021-04-10 NOTE — Consult Note (Addendum)
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 He does have 3 vessel coronary calcifications on CT in Oct 2021 and now.  May need coronary CTA once improves vs cath depending on echo and status.  2. Hx of arrhthymias, none currently. 3. Sepsis on ABX per CCM/IM secondary to cholecystitis followed by surgery 4. Acute on chronic respiratory failure on vent and sedated followed by CCM.   5. Chronic respiratory failure after COVID PNA, wears 02 at home  6. DM-2 per IM 7. HLD  On crestor at home, resume once chole resolved.   Risk Assessment/Risk Scores:     TIMI Risk Score for Unstable Angina or Non-ST Elevation MI:   The patient's TIMI risk score is 5, which indicates a 26% risk of all cause mortality, new or recurrent myocardial infarction or need for urgent revascularization in the next 14  days.          For questions or updates, please contact [facility] Please consult [email or web address] for contact info under    Signed, Cecilie Kicks, NP  04/10/2021 4:23 PM As above, patient seen and examined.  Briefly he is a 70 year old male with past medical history of diabetes mellitus, hypertension, COVID pneumonia requiring home oxygen, admitted with probable cholecystitis/sepsis for evaluation of elevated troponin and abnormal ECG.  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  Echocardiogram September 2021 showed ejection fraction 60 to 65%, mild left ventricular hypertrophy, grade 1 diastolic dysfunction and mildly dilated aortic root at 41 mm.  Patient admitted with probable cholecystitis.  He developed respiratory distress and was intubated.  He has been treated with antibiotics and is presently on low-dose norepinephrine.  Troponin minimally elevated and electrocardiogram shows new anterior T wave inversion.  Cardiology asked to evaluate. Patient is intubated but shakes his head no to chest pain or dyspnea.  Patient's wife states he has not been having exertional chest pain at home. Troponin I 157, 148, 119 and 89.  Electrocardiogram shows normal sinus rhythm, cannot rule out anterior infarct and new anterior T wave inversion.  1 minimally elevated troponin/anterior T wave inversion noted on ECG-patient has not had chest pain and this may be related to demand ischemia.  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  I will arrange an echocardiogram to assess LV function and wall motion.  If normal he will likely need a stress nuclear study for risk stratification once he recovers from present illness.  Must also consider stress-induced cardiomyopathy.  We will add aspirin 81 mg via tube daily.  Continue statin.  He is presently on  low-dose norepinephrine and therefore beta-blockade cannot be added.  2 ventilator dependent respiratory failure-Per critical care medicine.  3 cholecystitis/sepsis-continue antibiotics.  Kirk Ruths, MD

## 2021-04-10 NOTE — Progress Notes (Addendum)
Initial Nutrition Assessment  DOCUMENTATION CODES:   Not applicable  INTERVENTION:   Initiate tube feeding via OG tube: Vital AF 1.2 at 65 ml/h (1560 ml per day) Prosource TF 45 ml once daily  Provides 1952 kcal, 128 gm protein, 1265 ml free water daily  NUTRITION DIAGNOSIS:   Inadequate oral intake related to inability to eat as evidenced by NPO status.  GOAL:   Patient will meet greater than or equal to 90% of their needs  MONITOR:   Vent status,TF tolerance,Labs  REASON FOR ASSESSMENT:   Ventilator,Consult Enteral/tube feeding initiation and management  ASSESSMENT:   70 yo male admitted with sepsis, cholecystitis. S/P cholecystostomy drain placement on admission. PMH includes COVID with chronic respiratory failure, melanoma, DM-2, reflux, HTN, HLD.   Spoke with patient's wife at bedside. Patient has been eating well up until 5-6 days ago when the infection started and he developed abdominal pain. Prior to that, he had a good appetite and was eating normally.  Discussed patient in ICU rounds and with RN today. RD to order TF per discussion in rounds. OG tube in place.  Cholecystostomy drain output 100 ml x 24 hours.  Patient is currently intubated on ventilator support MV: 9.1 L/min Temp (24hrs), Avg:98.2 F (36.8 C), Min:97.9 F (36.6 C), Max:98.5 F (36.9 C) MAP >/= 70 this morning Propofol: off  Labs reviewed. Na 133 CBG: 154-168  Medications reviewed and include Levophed, Mag sulfate, Flagyl.  Weight history reviewed. No significant weight changes noted.   NUTRITION - FOCUSED PHYSICAL EXAM:  Flowsheet Row Most Recent Value  Orbital Region No depletion  Upper Arm Region No depletion  Thoracic and Lumbar Region No depletion  Buccal Region Unable to assess  Temple Region No depletion  Clavicle Bone Region No depletion  Clavicle and Acromion Bone Region No depletion  Scapular Bone Region Unable to assess  Dorsal Hand No depletion  Patellar  Region Mild depletion  Anterior Thigh Region Mild depletion  Posterior Calf Region Mild depletion  Edema (RD Assessment) None  Hair Reviewed  Eyes Unable to assess  Mouth Unable to assess  Skin Reviewed       Diet Order:   Diet Order            Diet NPO time specified  Diet effective now                 EDUCATION NEEDS:   Not appropriate for education at this time  Skin:  Skin Assessment: Reviewed RN Assessment  Last BM:  5/10  Height:   Ht Readings from Last 1 Encounters:  04/09/21 5\' 10"  (1.778 m)    Weight:   Wt Readings from Last 1 Encounters:  04/10/21 91.2 kg    Ideal Body Weight:  75.5 kg  BMI:  Body mass index is 28.85 kg/m.  Estimated Nutritional Needs:   Kcal:  1880  Protein:  120-140 gm  Fluid:  >/= 1.9 L    Lucas Mallow, RD, LDN, CNSC Please refer to Amion for contact information.

## 2021-04-10 NOTE — Progress Notes (Signed)
eLink Physician-Brief Progress Note Patient Name: Tim Day DOB: 01-05-1951 MRN: [id number]   Date of Service  04/10/2021  HPI/Events of Note  Notified of need for fentanyl gtt for pain control. Pt remains intubated.  Foley cath now inserted by urology.  eICU Interventions  Fentanyl gtt ordered.  Bilateral wrist restraints ordered.     Intervention Category Intermediate Interventions: Pain - evaluation and management  Elsie Lincoln 04/10/2021, 5:23 AM

## 2021-04-10 NOTE — Progress Notes (Signed)
ETT pulled back 4cm per MD order. ETT secured at 22 at the lips.  RN made aware and follow up x-ray will be order.
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
## 2021-04-10 NOTE — Progress Notes (Signed)
eLink Physician-Brief Progress Note Patient Name: Tim Day DOB: 06/14/1951 MRN: [id number]   Date of Service  04/10/2021  HPI/Events of Note  CXR reviewed with the tip of the ETT at the level of the carina.   eICU Interventions  Pull back ETT by 4cm.  Repeat CXR.     Intervention Category Intermediate Interventions: Diagnostic test evaluation  Elsie Lincoln 04/10/2021, 1:49 AM

## 2021-04-10 NOTE — Procedures (Signed)
Interventional Radiology Procedure Note  Procedure: Cholecystostomy drain  Indication: Acute cholecystitis  Findings: Please refer to procedural dictation for full description.  Complications: None  EBL: < 10 mL  Miachel Roux, MD [phone]

## 2021-04-10 NOTE — Hospital Course (Addendum)
  70 y/o m presented w septic shock 2/2 acute cholecystitis. S/p cholecystostomy tube placement by IR. Stable and off of pressors and extubated. Cards is also on board for some EKG changes he had.

## 2021-04-10 NOTE — Progress Notes (Addendum)
ADDENDUM: Repeat EKG still shows T inversions in anterio-lateral leads which is new comparing to EKG yesterday. Trop remains flat (trended down since arrival). Vasospasm?  Cardiac Hx: Three-vessel coronary artery calcifications on CT 09/17/2020. Echo 08/26/2020: nl LVEF 60-65%. Grade 1DD. Aortic dilation. Patient had a Left heart cath back in 2016 per chart.   I talked to Dr. Johnsie Cancel, cardiologist about patient. Agreed to get echo and he willl kindly see the patient. EKG changes could be initial changes suggestive of Takotsubo.  Echo is pending.   -F/u echo -Ordering home Crestor to be given per tube. Lipid panel. -Appreciate cardiology recommendations.  Ellie Story Vanvranken. MD. Zigmund Gottron 04/10/2021, 3:25 PM Pager: [phone]

## 2021-04-10 NOTE — Progress Notes (Signed)
Sputum culture collected and sent to the lab. 

## 2021-04-10 NOTE — Progress Notes (Incomplete)
Attending note: I have seen and examined the patient. History, labs and imaging reviewed.  70 Y/O with COVID, DVT, admitted with sepsis secondary to acute cholecystitis Transferred to [facility] for perc biliary drain. Not a surgical caididate Remains on vent, off pressors. levophed weaning to 4  Blood pressure (!) 88/63, pulse 62, temperature 98.2 F (36.8 C), temperature source Axillary, resp. rate 16, height 5\' 10"  (1.778 m), weight 91.2 kg, SpO2 100 %. Gen:      No acute distress HEENT:  EOMI, sclera anicteric Neck:     No masses; no thyromegaly Lungs:    Clear to auscultation bilaterally; normal respiratory effort*** CV:         Regular rate and rhythm; no murmurs Abd:      + bowel sounds; soft, non-tender; no palpable masses, no distension Ext:    No edema; adequate peripheral perfusion Skin:      Warm and dry; no rash Neuro: alert and oriented x 3 Psych: normal mood and affect   Labs/Imaging personally reviewed, significant for Na 133, lactic acid 1.4 WBC increased to 15.8   Assessment/plan: Sepsis present on admission    Elevated troponins Likely demand Follow echo, EKG    The patient is critically ill with multiple organ systems failure and requires high complexity decision making for assessment and support, frequent evaluation and titration of therapies, application of advanced monitoring technologies and extensive interpretation of multiple databases.  Critical care time - 35 mins. This represents my time independent of the NPs time taking care of the pt.  Marshell Garfinkel MD [facility] 04/10/2021, 10:10 AM

## 2021-04-11 ENCOUNTER — Inpatient Hospital Stay (HOSPITAL_COMMUNITY): Payer: 59

## 2021-04-11 DIAGNOSIS — R778 Other specified abnormalities of plasma proteins: Secondary | ICD-10-CM | POA: Diagnosis not present

## 2021-04-11 DIAGNOSIS — R9431 Abnormal electrocardiogram [ECG] [EKG]: Secondary | ICD-10-CM

## 2021-04-11 LAB — COMPREHENSIVE METABOLIC PANEL
ALT: 28 U/L (ref 0–44)
AST: 25 U/L (ref 15–41)
Albumin: 1.8 g/dL — ABNORMAL LOW (ref 3.5–5.0)
Alkaline Phosphatase: 54 U/L (ref 38–126)
Anion gap: 5 (ref 5–15)
BUN: 25 mg/dL — ABNORMAL HIGH (ref 8–23)
CO2: 27 mmol/L (ref 22–32)
Calcium: 8.2 mg/dL — ABNORMAL LOW (ref 8.9–10.3)
Chloride: 106 mmol/L (ref 98–111)
Creatinine, Ser: 0.77 mg/dL (ref 0.61–1.24)
GFR, Estimated: >60 mL/min (ref >60)
Glucose, Bld: 225 mg/dL — ABNORMAL HIGH (ref 70–99)
Potassium: 4.1 mmol/L (ref 3.5–5.1)
Sodium: 138 mmol/L (ref 135–145)
Total Bilirubin: <0.1 mg/dL — ABNORMAL LOW (ref 0.3–1.2)
Total Protein: 4.8 g/dL — ABNORMAL LOW (ref 6.5–8.1)

## 2021-04-11 LAB — CBC WITH DIFFERENTIAL/PLATELET
Abs Immature Granulocytes: 0.43 10*3/uL — ABNORMAL HIGH (ref 0.00–0.07)
Basophils Absolute: 0 10*3/uL (ref 0.0–0.1)
Basophils Relative: 0 %
Eosinophils Absolute: 0.1 10*3/uL (ref 0.0–0.5)
Eosinophils Relative: 1 %
HCT: 38.3 % — ABNORMAL LOW (ref 39.0–52.0)
Hemoglobin: 12.3 g/dL — ABNORMAL LOW (ref 13.0–17.0)
Immature Granulocytes: 5 %
Lymphocytes Relative: 12 %
Lymphs Abs: 1.1 10*3/uL (ref 0.7–4.0)
MCH: 28.2 pg (ref 26.0–34.0)
MCHC: 32.1 g/dL (ref 30.0–36.0)
MCV: 87.8 fL (ref 80.0–100.0)
Monocytes Absolute: 0.8 10*3/uL (ref 0.1–1.0)
Monocytes Relative: 9 %
Neutro Abs: 6.8 10*3/uL (ref 1.7–7.7)
Neutrophils Relative %: 73 %
Platelets: 216 10*3/uL (ref 150–400)
RBC: 4.36 MIL/uL (ref 4.22–5.81)
RDW: 15.1 % (ref 11.5–15.5)
WBC: 9.3 10*3/uL (ref 4.0–10.5)
nRBC: 0 % (ref 0.0–0.2)

## 2021-04-11 LAB — ECHOCARDIOGRAM COMPLETE
Area-P 1/2: 3.99 cm2
Height: 70 in
S' Lateral: 2.1 cm
Weight: 3216.95 oz

## 2021-04-11 LAB — GLUCOSE, CAPILLARY
Glucose-Capillary: 198 mg/dL — ABNORMAL HIGH (ref 70–99)
Glucose-Capillary: 209 mg/dL — ABNORMAL HIGH (ref 70–99)
Glucose-Capillary: 206 mg/dL — ABNORMAL HIGH (ref 70–99)
Glucose-Capillary: 97 mg/dL (ref 70–99)
Glucose-Capillary: 49 mg/dL — ABNORMAL LOW (ref 70–99)
Glucose-Capillary: 129 mg/dL — ABNORMAL HIGH (ref 70–99)

## 2021-04-11 MED ORDER — INSULIN GLARGINE 100 UNIT/ML ~~LOC~~ SOLN
18.0000 [IU] | Freq: Every day | SUBCUTANEOUS | Status: DC
  Administered 2021-04-11: 18 [IU] via SUBCUTANEOUS
  Filled 2021-04-11 (×2): qty 0.18

## 2021-04-11 MED ORDER — PERFLUTREN LIPID MICROSPHERE
INTRAVENOUS | Status: AC
  Filled 2021-04-11: qty 10

## 2021-04-11 MED ORDER — DEXTROSE 50 % IV SOLN: 25.0000 g | INTRAVENOUS | Status: AC

## 2021-04-11 MED ORDER — DEXTROSE 50 % IV SOLN
INTRAVENOUS | Status: AC
  Administered 2021-04-11: 25 g via INTRAVENOUS
  Filled 2021-04-11: qty 50

## 2021-04-11 MED ORDER — PANTOPRAZOLE SODIUM 40 MG PO PACK
40.0000 mg | PACK | Freq: Every day | ORAL | Status: DC
  Filled 2021-04-11: qty 20

## 2021-04-11 MED ORDER — ORAL CARE MOUTH RINSE
15.0000 mL | Freq: Two times a day (BID) | OROMUCOSAL | Status: DC
  Administered 2021-04-11 – 2021-04-12 (×2): 15 mL via OROMUCOSAL

## 2021-04-11 MED ORDER — FAMOTIDINE 20 MG PO TABS
20.0000 mg | ORAL_TABLET | Freq: Every day | ORAL | Status: DC
  Administered 2021-04-12 – 2021-04-15 (×4): 20 mg via ORAL
  Filled 2021-04-11 (×5): qty 1

## 2021-04-11 MED ORDER — PERFLUTREN LIPID MICROSPHERE
1.0000 mL | INTRAVENOUS | Status: AC | PRN
  Administered 2021-04-11: 4 mL via INTRAVENOUS
  Filled 2021-04-11: qty 10

## 2021-04-11 MED ORDER — WHITE PETROLATUM EX OINT
TOPICAL_OINTMENT | CUTANEOUS | Status: AC
  Filled 2021-04-11: qty 28.35

## 2021-04-11 MED ORDER — ENOXAPARIN SODIUM 40 MG/0.4ML IJ SOSY
40.0000 mg | PREFILLED_SYRINGE | INTRAMUSCULAR | Status: DC
  Administered 2021-04-11 – 2021-04-14 (×4): 40 mg via SUBCUTANEOUS
  Filled 2021-04-11 (×4): qty 0.4

## 2021-04-11 MED FILL — Fentanyl Citrate Preservative Free (PF) Inj 100 MCG/2ML: INTRAMUSCULAR | Qty: 2 | Status: AC

## 2021-04-11 NOTE — Progress Notes (Signed)
Supervising Physician: Corrie Mckusick  Patient Status:  [facility] - In-pt  Chief Complaint: Follow up percutaneous cholecystostomy placed 04/09/21 in IR  Subjective:  Patient awake, remains intubated, nods yes/no appropriately to questions - denies any pain right now. Per family member at bedside they are going to try and take the tube out of his mouth today. She is also wondering how long the bag will need to stay.  Allergies: Patient has no known allergies.  Medications: Prior to Admission medications   Medication Sig Start Date End Date Taking? Authorizing Provider  albuterol (VENTOLIN HFA) 108 (90 Base) MCG/ACT inhaler Inhale 2 puffs into the lungs every 6 (six) hours as needed for wheezing or shortness of breath. 09/28/20  Yes Oswald Hillock, MD  alclomethasone (ACLOVATE) 0.05 % cream Apply topically 2 (two) times daily as needed (Rash). 03/13/21  Yes Sheffield, Ronalee Red, PA-C  ALPRAZolam (XANAX) 0.25 MG tablet Take 0.25 mg by mouth 2 (two) times daily as needed for anxiety.   Yes [provider]  alum & mag hydroxide-simeth (MAALOX/MYLANTA) 200-200-20 MG/5ML suspension Take 30 mLs by mouth every 4 (four) hours as needed for indigestion. 09/28/20  Yes Oswald Hillock, MD  ascorbic acid (VITAMIN C) 500 MG tablet Take 1 tablet (500 mg total) by mouth daily. 08/04/20  Yes Johnson, Clanford L, MD  Cholecalciferol (VITAMIN D3) 50 MCG (2000 UT) TABS Take 1 tablet by mouth daily.   Yes [provider]  co-enzyme Q-10 50 MG capsule Take 50 mg by mouth 2 (two) times daily.   Yes [provider]  famotidine (PEPCID) 20 MG tablet Take 20 mg by mouth daily. 03/13/21  Yes [provider]  FIASP FLEXTOUCH 100 UNIT/ML FlexTouch Pen Inject 1-3 Units into the skin every evening. 02/26/21  Yes [provider]  furosemide (LASIX) 20 MG tablet Take 20 mg by mouth daily. 03/28/21  Yes [provider]  ibuprofen (ADVIL) 600 MG tablet Take 1 tablet (600 mg total) by  mouth every 6 (six) hours as needed. 04/06/21  Yes Melynda Ripple, MD  JANUVIA 100 MG tablet Take 100 mg by mouth daily. 03/13/21  Yes [provider]  ketoconazole (NIZORAL) 2 % cream Apply 1 application topically 2 (two) times daily. 03/13/21 04/24/21 Yes Sheffield, Ronalee Red, PA-C  metFORMIN (GLUCOPHAGE) 1000 MG tablet Take 1,000 mg by mouth 2 (two) times daily with a meal.   Yes [provider]  Multiple Vitamin (MULTIVITAMIN) capsule Take 1 capsule by mouth daily.   Yes [provider]  olmesartan-hydrochlorothiazide (BENICAR HCT) 20-12.5 MG tablet Take 1 tablet by mouth daily. 03/05/21  Yes [provider]  rosuvastatin (CRESTOR) 5 MG tablet Take 5 mg by mouth daily. 03/05/21  Yes [provider]  sildenafil (VIAGRA) 100 MG tablet Take 100 mg by mouth at bedtime. 02/26/21  Yes [provider]  TOUJEO MAX SOLOSTAR 300 UNIT/ML Solostar Pen Inject 30 Units into the skin in the morning. 02/26/21  Yes [provider]  zinc sulfate 220 (50 Zn) MG capsule Take 1 capsule (220 mg total) by mouth daily. 08/04/20  Yes Johnson, Clanford L, MD  zolpidem (AMBIEN) 10 MG tablet Take 10 mg by mouth at bedtime as needed. 02/27/21  Yes [provider]  ACCU-CHEK GUIDE test strip USE TO TEST TWICE DAILY.D 02/06/21   [provider]  blood glucose meter kit and supplies KIT Dispense based on patient and insurance preference. Use up to four times daily as directed. (FOR  ICD-9 250.00, 250.01). 08/14/20   Little Ishikawa, MD  insulin aspart (NOVOLOG) 100 UNIT/ML injection Inject 0-15 Units into the skin 3 (three) times daily with meals. Sliding scale insulin Less than 70 initiate hypoglycemia protocol 70-120 - 0 units,  120-150-  2 units,  151-200 -3 units,  201-250- 5 units,  251-300- 8 units,  301-350- 11 units,  351-400- 15 units Greater than 400 call MD Patient not taking: No sig reported 09/28/20   Oswald Hillock, MD  Insulin Pen Needle (PEN  NEEDLES) 32G X 4 MM MISC 1 Package by Does not apply route 4 (four) times daily -  before meals and at bedtime. 08/14/20   Little Ishikawa, MD  ondansetron (ZOFRAN) 4 MG tablet Take 1 tablet (4 mg total) by mouth every 6 (six) hours as needed for nausea. Patient not taking: Reported on 04/09/2021 09/28/20   Oswald Hillock, MD  pantoprazole (PROTONIX) 40 MG tablet Take 40 mg by mouth daily. Patient not taking: Reported on 04/09/2021    [provider]     Vital Signs: BP (!) 96/57   Pulse 74   Temp 98.3 F (36.8 C) (Oral)   Resp (!) 9   Ht 5' 10" (1.778 m)   Wt 201 lb 1 oz (91.2 kg)   SpO2 100%   BMI 28.85 kg/m   Physical Exam Vitals and nursing note reviewed.  Constitutional:      General: He is not in acute distress. HENT:     Head: Normocephalic.  Cardiovascular:     Rate and Rhythm: Normal rate.  Pulmonary:     Effort: Pulmonary effort is normal.  Abdominal:     General: There is no distension.     Palpations: Abdomen is soft.     Tenderness: There is abdominal tenderness (mildly TTP over perc chole insertion site).     Comments: (+) RUQ drain to gravity with ~50 cc bloody bilious output. Insertion site clean, dry, dressed appropriately without erythema, edema or leakage.   Skin:    General: Skin is warm and dry.  Neurological:     Mental Status: He is alert. Mental status is at baseline.     Imaging: CT Abdomen Pelvis W Contrast  Result Date: 04/09/2021 CLINICAL DATA:  Right-sided abdominal pain for 10 days, shortness of breath EXAM: CT ABDOMEN AND PELVIS WITH CONTRAST TECHNIQUE: Multidetector CT imaging of the abdomen and pelvis was performed using the standard protocol following bolus administration of intravenous contrast. CONTRAST:  12m OMNIPAQUE IOHEXOL 300 MG/ML  SOLN COMPARISON:  CT abdomen pelvis, 04/04/2021 FINDINGS: Lower chest: No acute abnormality. Three-vessel coronary artery calcifications. Dependent bibasilar scarring and/or partial  atelectasis. Hepatobiliary: No solid liver abnormality is seen. The gallbladder is mildly distended with gallbladder wall thickening and adjacent fat stranding. There are no radiopaque gallstones. No biliary ductal dilatation. Small pockets of subcapsular fluid adjacent to the gallbladder fossa (series 6, image 39, 62). Gallbladder wall thickening, or biliary dilatation. Pancreas: Unremarkable. No pancreatic ductal dilatation or surrounding inflammatory changes. Spleen: Normal in size without significant abnormality. Adrenals/Urinary Tract: Adrenal glands are unremarkable. Kidneys are normal, without renal calculi, solid lesion, or hydronephrosis. Bladder is unremarkable. Stomach/Bowel: Stomach is within normal limits. Appendix appears normal. No evidence of bowel wall thickening, distention, or inflammatory changes. Vascular/Lymphatic: Aortic atherosclerosis. No enlarged abdominal or pelvic lymph nodes. Reproductive: No mass or other significant abnormality. Other: No abdominal wall hernia or abnormality. No abdominopelvic ascites. Musculoskeletal: No acute or significant osseous findings. IMPRESSION:  1. The gallbladder is mildly distended with gallbladder wall thickening and adjacent fat stranding, concerning for acute cholecystitis. 2. There are no radiopaque gallstones. No biliary ductal dilatation. 3. Small pockets of subcapsular fluid adjacent to the gallbladder fossa, possibly small volume ascites. The presence or absence of infection within this fluid is not established by CT. 4. Coronary artery disease. Aortic Atherosclerosis (ICD10-I70.0). Electronically Signed   By: Eddie Candle M.D.   On: 04/09/2021 13:43   US Abdomen Limited  Result Date: 04/09/2021 CLINICAL DATA:  Right abdominal pain EXAM: ULTRASOUND ABDOMEN LIMITED RIGHT UPPER QUADRANT COMPARISON:  None. FINDINGS: Gallbladder: No wall thickening visualized. Gallstones are present measuring up to 1.1 cm. Sludge is present. Small volume  pericholecystic free fluid. No sonographic Murphy sign noted by sonographer. Common bile duct: Diameter: 5 mm, normal. There is possible echogenic material within the duct. Liver: No focal lesion identified. Increased parenchymal echogenicity. Portal vein is patent on color Doppler imaging with normal direction of blood flow towards the liver. Other: None. IMPRESSION: Gallstones and sludge. Small volume pericholecystic free fluid. Indeterminate for acute cholecystitis. Consider nuclear medicine study. Mildly increased liver echogenicity probably reflects steatosis. Electronically Signed   By: Macy Mis M.D.   On: 04/09/2021 14:59   IR Perc Cholecystostomy  Result Date: 04/10/2021 INDICATION: 70 year old gentleman presented to the ED with complaints of abdominal pain. Imaging findings suspicious for cholecystitis. EXAM: Ultrasound and fluoroscopic guided cholecystostomy drain placement MEDICATIONS: Ceftriaxone 2 g IV; The antibiotic was administered within an appropriate time frame prior to the initiation of the procedure. ANESTHESIA/SEDATION: None FLUOROSCOPY TIME:  Fluoroscopy Time: 0 minutes 36 seconds (8 mGy). COMPLICATIONS: None immediate. PROCEDURE: Informed written consent was obtained from the patient's wife after a thorough discussion of the procedural risks, benefits and alternatives. All questions were addressed. Maximal Sterile Barrier Technique was utilized including caps, mask, sterile gowns, sterile gloves, sterile drape, hand hygiene and skin antiseptic. A timeout was performed prior to the initiation of the procedure. Patient positioned supine on the procedure table. Right upper quadrant skin prepped and draped in usual fashion. Sterile ultrasound probe cover and gel utilized throughout the procedure. Following local lidocaine administration, 19 gauge Yueh needle advanced into the gallbladder lumen utilizing continuous ultrasound guidance. Yueh catheter exchanged for 10.2 [place] multipurpose  pigtail drain over 0.035 inch guidewire. 10 mL of dark brown bile aspirated and sent for Gram stain and culture. Drain secured to skin with suture and connected to bag. IMPRESSION: 10.2 French cholecystostomy drain placed utilizing fluoroscopic and ultrasound guidance. Electronically Signed   By: Miachel Roux M.D.   On: 04/10/2021 09:26   DG CHEST PORT 1 VIEW  Result Date: 04/10/2021 CLINICAL DATA:  70 year old male endotracheal tube adjusted. EXAM: PORTABLE CHEST 1 VIEW COMPARISON:  0123 hours today and earlier. FINDINGS: Portable AP semi upright view at 0307 hours. Endotracheal tube tip now projects at the level the clavicles, 2-3 cm above the carina allowing for somewhat more kyphotic positioning. Enteric tube courses to the abdomen, tip not included. Continued low lung volumes with stable ventilation. Stable cardiac size and mediastinal contours. No pneumothorax or pleural effusion is evident. IMPRESSION: 1. Endotracheal tube tip satisfactory at the level the clavicles. Enteric tube courses to the abdomen, tip not included. 2. Continued low lung volumes with stable ventilation. Electronically Signed   By: Genevie Ann M.D.   On: 04/10/2021 04:04   DG CHEST PORT 1 VIEW  Result Date: 04/10/2021 CLINICAL DATA:  70 year old male status post intubation EXAM: PORTABLE  CHEST 1 VIEW COMPARISON:  Earlier radiograph dated 04/09/2021. FINDINGS: Endotracheal tube with tip at the level of the carina. Recommend retraction by approximately 4 cm for optimal positioning. Enteric tube extends below the diaphragm with side-port in the proximal stomach and tip beyond the inferior margin of the image. Shallow inspiration. No significant interval change in bilateral streaky pulmonary densities, left greater right. No pleural effusion pneumothorax. Stable cardiomediastinal silhouette. No acute osseous pathology. IMPRESSION: 1. Endotracheal tube with tip at the level of the carina. Recommend retraction by approximately 4 cm for  optimal positioning. 2. No significant interval change in the bilateral pulmonary densities. Electronically Signed   By: Anner Crete M.D.   On: 04/10/2021 01:39   DG Chest Port 1 View  Result Date: 04/09/2021 CLINICAL DATA:  Check endotracheal tube placement EXAM: PORTABLE CHEST 1 VIEW COMPARISON:  Film from earlier in the same day. FINDINGS: Cardiac shadow is stable. Endotracheal tube is noted at the level of the carina. This could be withdrawn 1-2 cm. Gastric catheter is noted within the stomach overall inspiratory effort is poor with bibasilar atelectasis and chronic fibrotic changes. IMPRESSION: New bibasilar atelectatic changes. Tubes and lines as described above. The endotracheal tube could be withdrawn 1-2 cm. Electronically Signed   By: Inez Catalina M.D.   On: 04/09/2021 22:54   DG CHEST PORT 1 VIEW  Result Date: 04/09/2021 CLINICAL DATA:  Intubated EXAM: PORTABLE CHEST 1 VIEW COMPARISON:  04/09/2021, CT 04/04/2021, 08/29/2020 FINDINGS: Interval intubation, tip of endotracheal tube is at the carina. Low lung volumes with coarse slightly reticular opacities, likely due to chronic interstitial disease. Redemonstrated left upper lung nodule. Stable cardiomediastinal silhouette. No pneumothorax. Interim placement of esophageal tube, the side port projects over the mid gastric region. Lucency in the left upper quadrant presumably represents air distended dilated bowel. IMPRESSION: 1. Endotracheal tube tip at the carina 2. Esophageal tube side-port overlies the mid gastric region 3. Lucency in the left upper quadrant probably represents air dilated bowel, suggest dedicated abdominal radiograph for further evaluation These results will be called to the ordering clinician or representative by the Radiologist Assistant, and communication documented in the PACS or [employer]. Electronically Signed   By: Donavan Foil M.D.   On: 04/09/2021 19:38   DG Chest Port 1 View  Result Date:  04/09/2021 CLINICAL DATA:  Cough RIGHT-sided abdominal pain for 10 days. EXAM: PORTABLE CHEST 1 VIEW COMPARISON:  Apr 04, 2021. FINDINGS: Low lung volumes.  Diminished from previous imaging. Accounting for this cardiomediastinal contours are stable. Subtle opacity developing in the RIGHT mid chest and lower chest. No sign of effusion. Gastric distension similar to the prior study. On limited assessment no acute skeletal process. IMPRESSION: 1. Subtle opacity developing in the RIGHT mid and lower chest. Atelectasis versus developing infection. 2. Low lung volumes. Electronically Signed   By: Zetta Bills M.D.   On: 04/09/2021 12:56    Labs:  CBC: Recent Labs    04/09/21 1608 04/09/21 2235 04/09/21 2322 04/10/21 0455 04/11/21 0910  WBC 21.3* 23.7*  --  15.8* 9.3  HGB 13.5 13.1 13.3 12.7* 12.3*  HCT 43.2 42.0 39.0 39.4 38.3*  PLT 288 334  --  296 216    COAGS: Recent Labs    08/23/20 0400 08/23/20 1719 08/24/20 0423 08/25/20 0533 04/10/21 0455  INR  --   --   --   --  1.4*  APTT 84* 67* 77* 91*  --     BMP: Recent Labs  09/02/20 1635 09/03/20 0220 09/04/20 0247 09/05/20 0321 09/06/20 0202 04/09/21 1153 04/09/21 1608 04/09/21 2235 04/09/21 2322 04/10/21 0455 04/11/21 0910  NA 131* 131* 131* 131*   < > 131*  --  131* 131* 133* 138  K 5.0 5.0 4.8 5.0   < > 3.9  --  4.6 4.1 3.6 4.1  CL 92* 91* 93* 94*   < > 94*  --  96*  --  100 106  CO2 32 _0 < > 26  --  23  --  26 27  GLUCOSE 145* 230* 225* 159*   < > 209*  --  254*  --  182* 225*  BUN _1 < > 28*  --  29*  --  26* 25*  CALCIUM 8.3* 8.4* 8.0* 8.3*   < > 9.1  --  8.5*  --  8.5* 8.2*  CREATININE 0.52* 0.66 0.68 0.54*   < > 0.85 0.82 0.94  --  0.80 0.77  GFRNONAA >60 >60 >60 >60   < > >60 >60 >60  --  >60 >60  GFRAA >60 >60 >60 >60  --   --   --   --   --   --   --    < > = values in this interval not displayed.    LIVER FUNCTION TESTS: Recent Labs    04/06/21 1450 04/09/21 1153 04/10/21 0455  04/11/21 0910  BILITOT 0.7 1.0 0.6 <0.1*  AST _2 ALT _3 ALKPHOS 55 73 67 54  PROT 7.0 7.0 5.4* 4.8*  ALBUMIN 4.4 3.1* 2.0* 1.8*    Assessment and Plan:  70 y/o M admitted for respiratory failure and acute calculous cholecystitis s/p percutaneous cholecystostomy 04/09/21 with Dr. Dwaine Gale.  Patient remains intubated but plans to extubate today per family member at bedside. Mildly tender at drain insertion site but otherwise non tender abdomen. Per I/O 100 cc output in last 24H, on exam ~50 cc bloody bilious output. No issues with flushing, insertion site unremarkable. Currently afebrile, WBC 9.3, hgb 12.3, plt 216, t.bili <0.1, culture of aspirate shows NGTD. He remains on ceftriaxone per primary team.  Continue Qshift drain flushes, record output Qshift, dressing changes QD. Please call IR with drain questions/concerns.  Plan for cholecystostomy tube to remain until cholecystectomy per general surgery, if patient deemed not a surgical candidate he will follow up with [place] as an outpatient to discussed drain exchange/cholangiogram/possible future removal.  IR will continue to follow.  Electronically Signed: Joaquim Nam, PA-C 04/11/2021, 11:43 AM   I spent a total of 15 Minutes at the the patient's bedside AND on the patient's hospital floor or unit, greater than 50% of which was counseling/coordinating care for percutaneous cholecystostomy follow up.

## 2021-04-11 NOTE — Progress Notes (Signed)
Progress Note  Patient Name: Tim Day Date of Encounter: 04/11/2021  [facility] Cardiologist: Minus Breeding, MD  Subjective   Intubated; no CP; alert  Inpatient Medications    Scheduled Meds: . aspirin  81 mg Per NG tube Daily  . chlorhexidine gluconate (MEDLINE KIT)  15 mL Mouth Rinse BID  . Chlorhexidine Gluconate Cloth  6 each Topical Daily  . enoxaparin (LOVENOX) injection  40 mg Subcutaneous Q24H  . feeding supplement (PROSource TF)  45 mL Per Tube Daily  . insulin aspart  0-15 Units Subcutaneous Q4H  . insulin glargine  15 Units Subcutaneous Daily  . mouth rinse  15 mL Mouth Rinse 10 times per day  . pantoprazole (PROTONIX) IV  40 mg Intravenous Q24H  . rosuvastatin  5 mg Per Tube Daily  . sodium chloride flush  5 mL Intracatheter Q8H   Continuous Infusions: . cefTRIAXone (ROCEPHIN)  IV Stopped (04/10/21 1849)  . dexmedetomidine (PRECEDEX) IV infusion 0.5 mcg/kg/hr (04/11/21 0700)  . feeding supplement (VITAL AF 1.2 CAL) 1,000 mL (04/10/21 1419)  . fentaNYL infusion INTRAVENOUS Stopped (04/10/21 0739)  . lactated ringers 75 mL/hr at 04/11/21 0700  . metronidazole Stopped (04/11/21 0029)  . norepinephrine (LEVOPHED) Adult infusion Stopped (04/11/21 0307)   PRN Meds: acetaminophen **OR** acetaminophen, docusate, fentaNYL, fentaNYL (SUBLIMAZE) injection, ondansetron **OR** ondansetron (ZOFRAN) IV, polyethylene glycol   Vital Signs    Vitals:   04/11/21 0630 04/11/21 0645 04/11/21 0700 04/11/21 0756  BP: 100/63 94/63 (!) 89/62   Pulse: 67 64 62   Resp: '17 16 19   ' Temp:    98.4 F (36.9 C)  TempSrc:    Oral  SpO2: 100% 100% 100%   Weight:      Height:        Intake/Output Summary (Last 24 hours) at 04/11/2021 0757 Last data filed at 04/11/2021 0700 Gross per 24 hour  Intake 4042.75 ml  Output 1420 ml  Net 2622.75 ml   Last 3 Weights 04/11/2021 04/10/2021 04/09/2021  Weight (lbs) 201 lb 1 oz 201 lb 1 oz 200 lb 9.9 oz  Weight (kg) 91.2 kg 91.2  kg 91 kg      Telemetry    Sinus with PACs- Personally Reviewed   Physical Exam   GEN: No acute distress.  Intubated Neck: No JVD Cardiac: RRR Respiratory: Clear to auscultation bilaterally. GI: Soft, non-distended  MS: No edema Neuro:  Nonfocal  Psych: Intubated so not assessed  Labs    High Sensitivity Troponin:   Recent Labs  Lab 04/09/21 1608 04/09/21 2235 04/10/21 0455 04/10/21 1058 04/10/21 1222  TROPONINIHS 148* 119* 89* 60* 60*      Chemistry Recent Labs  Lab 04/06/21 1450 04/09/21 1153 04/09/21 1608 04/09/21 2235 04/09/21 2322 04/10/21 0455  NA 137 131*  --  131* 131* 133*  K 4.3 3.9  --  4.6 4.1 3.6  CL 95* 94*  --  96*  --  100  CO2 25 26  --  23  --  26  GLUCOSE 149* 209*  --  254*  --  182*  BUN 14 28*  --  29*  --  26*  CREATININE 0.88 0.85 0.82 0.94  --  0.80  CALCIUM 9.5 9.1  --  8.5*  --  8.5*  PROT 7.0 7.0  --   --   --  5.4*  ALBUMIN 4.4 3.1*  --   --   --  2.0*  AST 23 22  --   --   --  24  ALT 27 19  --   --   --  27  ALKPHOS 55 73  --   --   --  67  BILITOT 0.7 1.0  --   --   --  0.6  GFRNONAA  --  >60 >60 >60  --  >60  ANIONGAP  --  11  --  12  --  7     Hematology Recent Labs  Lab 04/09/21 1608 04/09/21 2235 04/09/21 2322 04/10/21 0455  WBC 21.3* 23.7*  --  15.8*  RBC 4.76 4.73  --  4.57  HGB 13.5 13.1 13.3 12.7*  HCT 43.2 42.0 39.0 39.4  MCV 90.8 88.8  --  86.2  MCH 28.4 27.7  --  27.8  MCHC 31.3 31.2  --  32.2  RDW 15.1 15.0  --  15.1  PLT 288 334  --  296    BNP Recent Labs  Lab 04/09/21 1149  BNP 348.0*     Radiology    CT Abdomen Pelvis W Contrast  Result Date: 04/09/2021 CLINICAL DATA:  Right-sided abdominal pain for 10 days, shortness of breath EXAM: CT ABDOMEN AND PELVIS WITH CONTRAST TECHNIQUE: Multidetector CT imaging of the abdomen and pelvis was performed using the standard protocol following bolus administration of intravenous contrast. CONTRAST:  189m OMNIPAQUE IOHEXOL 300 MG/ML  SOLN  COMPARISON:  CT abdomen pelvis, 04/04/2021 FINDINGS: Lower chest: No acute abnormality. Three-vessel coronary artery calcifications. Dependent bibasilar scarring and/or partial atelectasis. Hepatobiliary: No solid liver abnormality is seen. The gallbladder is mildly distended with gallbladder wall thickening and adjacent fat stranding. There are no radiopaque gallstones. No biliary ductal dilatation. Small pockets of subcapsular fluid adjacent to the gallbladder fossa (series 6, image 39, 62). Gallbladder wall thickening, or biliary dilatation. Pancreas: Unremarkable. No pancreatic ductal dilatation or surrounding inflammatory changes. Spleen: Normal in size without significant abnormality. Adrenals/Urinary Tract: Adrenal glands are unremarkable. Kidneys are normal, without renal calculi, solid lesion, or hydronephrosis. Bladder is unremarkable. Stomach/Bowel: Stomach is within normal limits. Appendix appears normal. No evidence of bowel wall thickening, distention, or inflammatory changes. Vascular/Lymphatic: Aortic atherosclerosis. No enlarged abdominal or pelvic lymph nodes. Reproductive: No mass or other significant abnormality. Other: No abdominal wall hernia or abnormality. No abdominopelvic ascites. Musculoskeletal: No acute or significant osseous findings. IMPRESSION: 1. The gallbladder is mildly distended with gallbladder wall thickening and adjacent fat stranding, concerning for acute cholecystitis. 2. There are no radiopaque gallstones. No biliary ductal dilatation. 3. Small pockets of subcapsular fluid adjacent to the gallbladder fossa, possibly small volume ascites. The presence or absence of infection within this fluid is not established by CT. 4. Coronary artery disease. Aortic Atherosclerosis (ICD10-I70.0). Electronically Signed   By: AEddie CandleM.D.   On: 04/09/2021 13:43   UKoreaAbdomen Limited  Result Date: 04/09/2021 CLINICAL DATA:  Right abdominal pain EXAM: ULTRASOUND ABDOMEN LIMITED RIGHT  UPPER QUADRANT COMPARISON:  None. FINDINGS: Gallbladder: No wall thickening visualized. Gallstones are present measuring up to 1.1 cm. Sludge is present. Small volume pericholecystic free fluid. No sonographic Murphy sign noted by sonographer. Common bile duct: Diameter: 5 mm, normal. There is possible echogenic material within the duct. Liver: No focal lesion identified. Increased parenchymal echogenicity. Portal vein is patent on color Doppler imaging with normal direction of blood flow towards the liver. Other: None. IMPRESSION: Gallstones and sludge. Small volume pericholecystic free fluid. Indeterminate for acute cholecystitis. Consider nuclear medicine study. Mildly increased liver echogenicity probably reflects steatosis. Electronically Signed   By:  Macy Mis M.D.   On: 04/09/2021 14:59   IR Perc Cholecystostomy  Result Date: 04/10/2021 INDICATION: 70 year old gentleman presented to the ED with complaints of abdominal pain. Imaging findings suspicious for cholecystitis. EXAM: Ultrasound and fluoroscopic guided cholecystostomy drain placement MEDICATIONS: Ceftriaxone 2 g IV; The antibiotic was administered within an appropriate time frame prior to the initiation of the procedure. ANESTHESIA/SEDATION: None FLUOROSCOPY TIME:  Fluoroscopy Time: 0 minutes 36 seconds (8 mGy). COMPLICATIONS: None immediate. PROCEDURE: Informed written consent was obtained from the patient's wife after a thorough discussion of the procedural risks, benefits and alternatives. All questions were addressed. Maximal Sterile Barrier Technique was utilized including caps, mask, sterile gowns, sterile gloves, sterile drape, hand hygiene and skin antiseptic. A timeout was performed prior to the initiation of the procedure. Patient positioned supine on the procedure table. Right upper quadrant skin prepped and draped in usual fashion. Sterile ultrasound probe cover and gel utilized throughout the procedure. Following local lidocaine  administration, 19 gauge Yueh needle advanced into the gallbladder lumen utilizing continuous ultrasound guidance. Yueh catheter exchanged for 10.2 [place] multipurpose pigtail drain over 0.035 inch guidewire. 10 mL of dark brown bile aspirated and sent for Gram stain and culture. Drain secured to skin with suture and connected to bag. IMPRESSION: 10.2 French cholecystostomy drain placed utilizing fluoroscopic and ultrasound guidance. Electronically Signed   By: Miachel Roux M.D.   On: 04/10/2021 09:26   DG CHEST PORT 1 VIEW  Result Date: 04/10/2021 CLINICAL DATA:  70 year old male endotracheal tube adjusted. EXAM: PORTABLE CHEST 1 VIEW COMPARISON:  0123 hours today and earlier. FINDINGS: Portable AP semi upright view at 0307 hours. Endotracheal tube tip now projects at the level the clavicles, 2-3 cm above the carina allowing for somewhat more kyphotic positioning. Enteric tube courses to the abdomen, tip not included. Continued low lung volumes with stable ventilation. Stable cardiac size and mediastinal contours. No pneumothorax or pleural effusion is evident. IMPRESSION: 1. Endotracheal tube tip satisfactory at the level the clavicles. Enteric tube courses to the abdomen, tip not included. 2. Continued low lung volumes with stable ventilation. Electronically Signed   By: Genevie Ann M.D.   On: 04/10/2021 04:04   DG CHEST PORT 1 VIEW  Result Date: 04/10/2021 CLINICAL DATA:  70 year old male status post intubation EXAM: PORTABLE CHEST 1 VIEW COMPARISON:  Earlier radiograph dated 04/09/2021. FINDINGS: Endotracheal tube with tip at the level of the carina. Recommend retraction by approximately 4 cm for optimal positioning. Enteric tube extends below the diaphragm with side-port in the proximal stomach and tip beyond the inferior margin of the image. Shallow inspiration. No significant interval change in bilateral streaky pulmonary densities, left greater right. No pleural effusion pneumothorax. Stable  cardiomediastinal silhouette. No acute osseous pathology. IMPRESSION: 1. Endotracheal tube with tip at the level of the carina. Recommend retraction by approximately 4 cm for optimal positioning. 2. No significant interval change in the bilateral pulmonary densities. Electronically Signed   By: Anner Crete M.D.   On: 04/10/2021 01:39   DG Chest Port 1 View  Result Date: 04/09/2021 CLINICAL DATA:  Check endotracheal tube placement EXAM: PORTABLE CHEST 1 VIEW COMPARISON:  Film from earlier in the same day. FINDINGS: Cardiac shadow is stable. Endotracheal tube is noted at the level of the carina. This could be withdrawn 1-2 cm. Gastric catheter is noted within the stomach overall inspiratory effort is poor with bibasilar atelectasis and chronic fibrotic changes. IMPRESSION: New bibasilar atelectatic changes. Tubes and lines as described above.  The endotracheal tube could be withdrawn 1-2 cm. Electronically Signed   By: Inez Catalina M.D.   On: 04/09/2021 22:54   DG CHEST PORT 1 VIEW  Result Date: 04/09/2021 CLINICAL DATA:  Intubated EXAM: PORTABLE CHEST 1 VIEW COMPARISON:  04/09/2021, CT 04/04/2021, 08/29/2020 FINDINGS: Interval intubation, tip of endotracheal tube is at the carina. Low lung volumes with coarse slightly reticular opacities, likely due to chronic interstitial disease. Redemonstrated left upper lung nodule. Stable cardiomediastinal silhouette. No pneumothorax. Interim placement of esophageal tube, the side port projects over the mid gastric region. Lucency in the left upper quadrant presumably represents air distended dilated bowel. IMPRESSION: 1. Endotracheal tube tip at the carina 2. Esophageal tube side-port overlies the mid gastric region 3. Lucency in the left upper quadrant probably represents air dilated bowel, suggest dedicated abdominal radiograph for further evaluation These results will be called to the ordering clinician or representative by the Radiologist Assistant, and  communication documented in the PACS or [employer]. Electronically Signed   By: Donavan Foil M.D.   On: 04/09/2021 19:38   DG Chest Port 1 View  Result Date: 04/09/2021 CLINICAL DATA:  Cough RIGHT-sided abdominal pain for 10 days. EXAM: PORTABLE CHEST 1 VIEW COMPARISON:  Apr 04, 2021. FINDINGS: Low lung volumes.  Diminished from previous imaging. Accounting for this cardiomediastinal contours are stable. Subtle opacity developing in the RIGHT mid chest and lower chest. No sign of effusion. Gastric distension similar to the prior study. On limited assessment no acute skeletal process. IMPRESSION: 1. Subtle opacity developing in the RIGHT mid and lower chest. Atelectasis versus developing infection. 2. Low lung volumes. Electronically Signed   By: Zetta Bills M.D.   On: 04/09/2021 12:56    Patient Profile     70 year old male with past medical history of diabetes mellitus, hypertension, COVID pneumonia requiring home oxygen, admitted with probable cholecystitis/sepsis for evaluation of elevated troponin and abnormal ECG.  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  Echocardiogram September 2021 showed ejection fraction 60 to 65%, mild left ventricular hypertrophy, grade 1 diastolic dysfunction and mildly dilated aortic root at 41 mm.  Patient admitted with probable cholecystitis.  He developed respiratory distress and was intubated. Troponin minimally elevated and electrocardiogram shows new anterior T wave inversion.  Cardiology asked to evaluate.  Assessment & Plan    1 minimally elevated troponin/anterior T wave inversion noted on ECG-as outlined previously patient has not had chest pain in minimally elevated troponin may be secondary to demand ischemia.  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  Await echocardiogram to assess LV function.  If normal we will likely plan stress nuclear study once he recovers from cholecystitis.  Also consider stress-induced cardiomyopathy.  Continue aspirin and statin.  He is now off pressors.  We will add low-dose beta-blockade later as blood pressure allows.  2 ventilator dependent respiratory failure-Per critical care medicine.  3 cholecystitis/sepsis-continue antibiotics.  Appears to be improving.  Now off of norepinephrine.  For questions or updates, please contact [place] Please consult [email or web address] for contact info under        Signed, Kirk Ruths, MD  04/11/2021, 7:57 AM

## 2021-04-11 NOTE — Progress Notes (Addendum)
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 Presented w RUQ abd pain.  Apparently, patient had several UC, ED visit for rt ribs pain/abdominal pain recently with all work up unremarkable that time. CT and US imaging on current presentation were suspicious for cholecystitis. Patient developed respiratory arrest at [facility] (apparently after he recievd Dilaudid and despite Narcan), required intubation and was transferred to [facility] for cholecystostomy drain placement.  Pertinent  Medical History   Diagnosis Date  . Acute respiratory disease    . Atypical mole 12/30/2012    severe left post shoulder tx exc  . Candidiasis of urogenital sites    . Diabetes mellitus    . Diffuse myofascial pain syndrome    . Esophageal reflux    . Hypertension    . Impacted cerumen of right ear    . Melanoma ([place]) 06/14/2011    left ear mohs  . Mixed hyperlipidemia    . MM (malignant melanoma of skin) ([place]) 07/01/2017    right forearm melanoderma  . Primary insomnia    . Seborrheic dermatitis, unspecified    . Squamous cell carcinoma of skin 06/14/2011    left forearm medial cx3 34fu  . Thrombocytopenia, unspecified ([place])      Significant Hospital Events: Including procedures, antibiotic start and stop dates in addition to other pertinent events   . 5/9 Acute on chronic respiratory failure, required intubation 5/9 . 5/9 Sputum culture>Pending . 5/9 BC>No growth to date Interim History / Subjective:  Patient is intubated on ventilator. He is awake and follows commands. Able to  write down heeds needs on the paper.  Objective   Blood pressure (!) 89/62, pulse 62, temperature 99.5 F (37.5 C), temperature source Oral, resp. rate 19, height 5\' 10"  (1.778 m), weight 91.2 kg, SpO2 100 %.    Vent Mode: PRVC FiO2 (%):  [40 %] 40 % Set Rate:  [16 bmp] 16 bmp Vt Set:  [580 mL] 580 mL PEEP:  [5 cmH20] 5 cmH20 Plateau Pressure:  [24 cmH20-26 cmH20] 24 cmH20   Intake/Output Summary (Last 24 hours) at 04/11/2021 0719 Last data filed at 04/11/2021 0700 Gross per 24 hour  Intake 4042.75 ml  Output 1320 ml  Net 2722.75 ml   Filed Weights   04/09/21 1840 04/10/21 0500 04/11/21 0500  Weight: 91 kg 91.2 kg 91.2 kg   Examination:  Gen:      No acute distress HEENT:  EOMI, sclera anicteric Neck:     No masses; no thyromegaly, ETT Lungs:    Intubated, diffuse rhonchi, fine crackles, on ventilator: 40%, 5, RR12, 580 ml CV:         Regular rate and rhythm; no murmurs Abd:      + bowel sounds; soft, non-tender; no palpable masses, cholecystostomy tube in place. Brown-red bloody fluid in gravity bag Ext:    No edema; adequate peripheral perfusion Skin:      Warm and dry Neuro:  Alert. Follows commands GU: Foley in place. Clear yellow urine in the bag  Labs/imaging that I havepersonally reviewed  (right click and "Reselect all SmartList Selections" daily)  WBC 23, Trop 89 CBC Latest Ref Rng & Units 04/10/2021 04/09/2021 04/09/2021  WBC 4.0 - 10.5 K/uL 15.8(H) - 23.7(H)  Hemoglobin 13.0 - 17.0 g/dL 12.7(L) 13.3 13.1  Hematocrit 39.0 - 52.0 % 39.4 39.0 42.0  Platelets 150 - 400 K/uL 296 - 334   CMP Latest Ref Rng & Units 04/10/2021 04/09/2021 04/09/2021  Glucose 70 - 99 mg/dL 182(H) - 254(H)  BUN 8 - 23 mg/dL 26(H) - 29(H)  Creatinine 0.61 - 1.24 mg/dL 0.80 - 0.94  Sodium 135 - 145 mmol/L 133(L) 131(L) 131(L)  Potassium 3.5 - 5.1 mmol/L 3.6 4.1 4.6  Chloride 98 - 111 mmol/L 100 - 96(L)  CO2 22 - 32 mmol/L 26 - 23  Calcium 8.9 - 10.3 mg/dL 8.5(L) - 8.5(L)  Total Protein 6.5 -  8.1 g/dL 5.4(L) - -  Total Bilirubin 0.3 - 1.2 mg/dL 0.6 - -  Alkaline Phos 38 - 126 U/L 67 - -  AST 15 - 41 U/L 24 - -  ALT 0 - 44 U/L 27 - -   Resolved Hospital Problem list   NA  Assessment & Plan:  70 year old male with PMHx significant for chronic respiratory failure (since COVID infection 07/2020), 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  Septic shock 2/2 acute gallstone cholecystitis: WBC 15.9 on arrival, increased to 21.3. LFT nl. He had a CT scan of the abdomen as well as ultrasound of the right upper quadrant which was suggestive of possible mild acute cholecystitis.  His liver enzyme tests were within normal limits.  LA normal. AM Cortisol 19.4. LFT normal.  Today labs are pending. Not collected yet.  Now off of Levophed. BP soft but MAP>65 and stable on maintenance fluid. Has good urine output. Had fever yesterday. Leukocytosis resolved.  BC no growth 2 days.  -Improved. Now off of pressor -DC LR 41ml/h -Continue tube feeding today.  -Continue Rocephin and flagyl. (5/09>>) -S/p drain placement by IR  -CMP daily -Strict I and Os -Consider consulting surgery when more stable and before discharge for evaluation for elective cholecystectomy likely outpatient  Acute on chronic respiratory failure: Likely induced by opioid for pain control in hospital, also in setting of chronic respiratory failure (on1.5 li O2 , [place] at home since COVID infection per wife) and infection. -Intubated on ventilator, PRVC, 40%, PEEP 5, RR 12, TV 580 [6 x W (91 kg)]. HE is alert and oriented and follows commands. Breathing over ventilator. -He is on precedex 0.5 -Will try to wean and extubate today -ADDENDUM: successfully extubated.  Elevated Trop and BNP: Trop on arrival was 157.  Tended down but repeated EKG 5/10 showed new T investions in V2-V6). Demand vs Tak[place]ubo. Cardiology consulted. Echo is pending. Will decide about ischemic eval depends on echo result. Cardiac Hx: Three-vessel coronary artery calcifications on CT 09/17/2020. Echo 08/26/2020: nl LVEF 60-65%. Grade 1DD. Aortic dilation. Patient had a Left heart cath back in 2016 per chart. Denies any chest pain.  -F/u echo -Appreciate cardiology recommendations  Mild Urinary Retention 2/2 Hypospadias: Urology was consulted and placed foley cath for accurate urine out put measure.  ID DM2: On metformin, Toujeo, Januvia, sliding scale at home BG this AM at 200. Increasing Insulin.  -Increase lantus from 15 u to 18 u QD -On moderate SSI  -CBG monitoring. Blood Glucose goal  140-180.  Hx of HTN: Held home meds ona admission due to shock -Monitor BP -Hold home antihypertensive meds  Lung Nodules RLL Seen on 5/4 CT scan, 6 mm and 7 mm noncalcified right basilar lung nodules -Follow up CT scan in 3-6 months -Pulmonary follow up. Seen by Dr. Melvyn Novas previously   History of DVT: Patient developed DVT during COVID-19 infection  He was on Eliquis that was discontinued by admission around few weeks ago. -On subcutaneous Lovenox on SCDs for VT prophylaxis.  Best practice (right click and "Reselect all SmartList Selections" daily)  Diet:  Tube Feed  Pain/Anxiety/Delirium protocol (if indicated): Yes (RASS goal -1) VAP protocol (if indicated): Yes DVT prophylaxis: LMWH GI prophylaxis: PPI Glucose control:  SSI Yes and Basal insulin Yes Central venous access:  N/A Arterial line:  N/A Foley:  Yes, and it is still needed Mobility:  bed rest  PT consulted: N/A Last date of multidisciplinary goals of care discussion [5/10] Code Status:  full code Disposition: Stays in ICU  Critical care time:     Dewayne Hatch, MD [email or web address] 04/11/2021, 7:19 AM Pager: [phone]    Attending note: I have seen  and examined the patient. History, labs and imaging reviewed.  70 Y/O with COVID, DVT, admitted with sepsis secondary to acute cholecystitis Transferred to [facility] for perc biliary drain. Not a surgical caididate Off levophed, continues on 75 ml LR Continues on ceftriaxone and flagyl.  Blood pressure 98/61, pulse 80, temperature 98.4 F (36.9 C), temperature source Oral, resp. rate (!) 29, height 5\' 10"  (1.778 m), weight 91.2 kg, SpO2 100 %. Gen:      No acute distress HEENT:  EOMI, sclera anicteric, ETT Neck:     No masses; no thyromegaly Lungs:    Clear to auscultation bilaterally; normal respiratory effort CV:         Regular rate and rhythm; no murmurs Abd:      + bowel sounds; soft, non-tender; no palpable masses, no distension Ext:    No edema; adequate peripheral perfusion Skin:      Warm and dry; no rash Neuro: Awake on the ventilator, responds to commands  Labs/Imaging personally reviewed, significant for Creatinine 0.77, WBC 9.3, platelets 216 No new imaging  Assessment/plan: Sepsis present on admission Septic shock secondary to cholecystitis Stop LR.  On antibiotics.  Follow culture Off pressors  Elevated troponins Likely demand Follow echo Cardiology on board  Acute on chronic respiratory failure Post-COVID ILD Continue vent support for now. Weaning trials.  Sputum cultures noted with GPCs. Will await final cultuires No need for vanco at this point as MRSA swab is negative   The patient is critically ill with multiple organ systems failure and requires high complexity decision making for assessment and support, frequent evaluation and titration of therapies, application of advanced monitoring technologies and extensive interpretation of multiple databases.  Critical care time - 35 mins. This represents my time independent of the NPs time taking care of the pt.  Marshell Garfinkel MD [facility] 04/11/2021, 9:40 AM

## 2021-04-11 NOTE — Progress Notes (Signed)
  Echocardiogram 2D Echocardiogram has been performed.  Tim Day 04/11/2021, 11:06 AM

## 2021-04-12 DIAGNOSIS — R778 Other specified abnormalities of plasma proteins: Secondary | ICD-10-CM | POA: Diagnosis not present

## 2021-04-12 DIAGNOSIS — K810 Acute cholecystitis: Secondary | ICD-10-CM | POA: Diagnosis not present

## 2021-04-12 LAB — CBC
HCT: 38.8 % — ABNORMAL LOW (ref 39.0–52.0)
Hemoglobin: 12.3 g/dL — ABNORMAL LOW (ref 13.0–17.0)
MCH: 28 pg (ref 26.0–34.0)
MCHC: 31.7 g/dL (ref 30.0–36.0)
MCV: 88.4 fL (ref 80.0–100.0)
Platelets: 237 10*3/uL (ref 150–400)
RBC: 4.39 MIL/uL (ref 4.22–5.81)
RDW: 14.8 % (ref 11.5–15.5)
WBC: 9 10*3/uL (ref 4.0–10.5)
nRBC: 0 % (ref 0.0–0.2)

## 2021-04-12 LAB — CULTURE, RESPIRATORY W GRAM STAIN: Culture: NORMAL

## 2021-04-12 LAB — COMPREHENSIVE METABOLIC PANEL
ALT: 28 U/L (ref 0–44)
AST: 25 U/L (ref 15–41)
Albumin: 2 g/dL — ABNORMAL LOW (ref 3.5–5.0)
Alkaline Phosphatase: 55 U/L (ref 38–126)
Anion gap: 9 (ref 5–15)
BUN: 19 mg/dL (ref 8–23)
CO2: 27 mmol/L (ref 22–32)
Calcium: 8.3 mg/dL — ABNORMAL LOW (ref 8.9–10.3)
Chloride: 105 mmol/L (ref 98–111)
Creatinine, Ser: 0.7 mg/dL (ref 0.61–1.24)
GFR, Estimated: >60 mL/min (ref >60)
Glucose, Bld: 78 mg/dL (ref 70–99)
Potassium: 3.8 mmol/L (ref 3.5–5.1)
Sodium: 141 mmol/L (ref 135–145)
Total Bilirubin: 0.3 mg/dL (ref 0.3–1.2)
Total Protein: 5 g/dL — ABNORMAL LOW (ref 6.5–8.1)

## 2021-04-12 LAB — GLUCOSE, CAPILLARY
Glucose-Capillary: 122 mg/dL — ABNORMAL HIGH (ref 70–99)
Glucose-Capillary: 158 mg/dL — ABNORMAL HIGH (ref 70–99)
Glucose-Capillary: 168 mg/dL — ABNORMAL HIGH (ref 70–99)
Glucose-Capillary: 201 mg/dL — ABNORMAL HIGH (ref 70–99)
Glucose-Capillary: 75 mg/dL (ref 70–99)
Glucose-Capillary: 78 mg/dL (ref 70–99)
Glucose-Capillary: 79 mg/dL (ref 70–99)
Glucose-Capillary: 85 mg/dL (ref 70–99)

## 2021-04-12 MED ORDER — DOCUSATE SODIUM 100 MG PO CAPS: 100.0000 mg | ORAL_CAPSULE | Freq: Two times a day (BID) | ORAL | Status: DC | PRN

## 2021-04-12 MED ORDER — SODIUM CHLORIDE 0.9 % IV SOLN: INTRAVENOUS | Status: DC | PRN

## 2021-04-12 MED ORDER — ORAL CARE MOUTH RINSE
15.0000 mL | Freq: Two times a day (BID) | OROMUCOSAL | Status: DC
  Administered 2021-04-12 – 2021-04-15 (×6): 15 mL via OROMUCOSAL

## 2021-04-12 MED ORDER — ENSURE ENLIVE PO LIQD
237.0000 mL | Freq: Three times a day (TID) | ORAL | Status: DC
  Administered 2021-04-12 – 2021-04-15 (×9): 237 mL via ORAL

## 2021-04-12 MED ORDER — ZOLPIDEM TARTRATE 5 MG PO TABS
10.0000 mg | ORAL_TABLET | Freq: Once | ORAL | Status: AC
  Administered 2021-04-12: 10 mg via ORAL
  Filled 2021-04-12: qty 2

## 2021-04-12 MED ORDER — ROSUVASTATIN CALCIUM 5 MG PO TABS
5.0000 mg | ORAL_TABLET | Freq: Every day | ORAL | Status: DC
  Administered 2021-04-12 – 2021-04-15 (×4): 5 mg via ORAL
  Filled 2021-04-12 (×4): qty 1

## 2021-04-12 MED ORDER — INSULIN GLARGINE 100 UNIT/ML ~~LOC~~ SOLN
13.0000 [IU] | Freq: Every day | SUBCUTANEOUS | Status: DC
  Filled 2021-04-12: qty 0.13

## 2021-04-12 MED ORDER — ASPIRIN 81 MG PO CHEW
81.0000 mg | CHEWABLE_TABLET | Freq: Every day | ORAL | Status: DC
  Administered 2021-04-12 – 2021-04-15 (×4): 81 mg via ORAL
  Filled 2021-04-12 (×4): qty 1

## 2021-04-12 MED ORDER — ACETAMINOPHEN 650 MG RE SUPP: 650.0000 mg | Freq: Four times a day (QID) | RECTAL | Status: DC | PRN

## 2021-04-12 MED ORDER — ONDANSETRON HCL 4 MG PO TABS: 4.0000 mg | ORAL_TABLET | Freq: Four times a day (QID) | ORAL | Status: DC | PRN

## 2021-04-12 MED ORDER — ACETAMINOPHEN 325 MG PO TABS
650.0000 mg | ORAL_TABLET | Freq: Four times a day (QID) | ORAL | Status: DC | PRN
  Administered 2021-04-12 – 2021-04-13 (×3): 650 mg via ORAL
  Filled 2021-04-12 (×4): qty 2

## 2021-04-12 MED ORDER — POLYETHYLENE GLYCOL 3350 17 G PO PACK: 17.0000 g | PACK | Freq: Every day | ORAL | Status: DC | PRN

## 2021-04-12 MED ORDER — ONDANSETRON HCL 4 MG/2ML IJ SOLN: 4.0000 mg | Freq: Four times a day (QID) | INTRAMUSCULAR | Status: DC | PRN

## 2021-04-12 NOTE — Progress Notes (Addendum)
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 Presented w RUQ abd pain.  Apparently, patient had several UC, ED visit for rt ribs pain/abdominal pain recently with all work up unremarkable that time. CT and US imaging on current presentation were suspicious for cholecystitis. Patient developed respiratory arrest at [facility] (apparently after he recievd Dilaudid and despite Narcan), required intubation and was transferred to [facility] for cholecystostomy drain placement.  Pertinent  Medical History   Diagnosis Date  . Acute respiratory disease    . Atypical mole 12/30/2012    severe left post shoulder tx exc  . Candidiasis of urogenital sites    . Diabetes mellitus    . Diffuse myofascial pain syndrome    . Esophageal reflux    . Hypertension    . Impacted cerumen of right ear    . Melanoma ([place]) 06/14/2011    left ear mohs  . Mixed hyperlipidemia    . MM (malignant melanoma of skin) ([place]) 07/01/2017    right forearm melanoderma  . Primary insomnia    . Seborrheic dermatitis, unspecified    . Squamous cell carcinoma of skin 06/14/2011    left forearm medial cx3 67fu  . Thrombocytopenia, unspecified ([place])      Significant Hospital Events: Including procedures, antibiotic start and stop dates in addition to other pertinent events   . 5/9 Acute on chronic respiratory failure, required intubation 5/9 . 5/9 Sputum culture>Pending. Re incubated 5/11 . 5/9 BC>No growth to date Interim History / Subjective:  Patient is doing well. He states that he was able to eat some  apple sauce this morning and did not have any N/V or abdominal pain. Discussed plan of transferring him out of the ICU when bed available. No complaint or concern. All their questions were addressed.  Objective   Blood pressure 118/69, pulse 97, temperature 98.7 F (37.1 C), temperature source Oral, resp. rate (!) 33, height 5\' 10"  (1.778 m), weight 91.8 kg, SpO2 99 %.    Vent Mode: CPAP;PSV FiO2 (%):  [40 %] 40 % Set Rate:  [16 bmp] 16 bmp Vt Set:  [580 mL] 580 mL PEEP:  [5 cmH20] 5 cmH20 Pressure Support:  [5 cmH20] 5 cmH20 Plateau Pressure:  [24 cmH20] 24 cmH20   Intake/Output Summary (Last 24 hours) at 04/12/2021 0746 Last data filed at 04/12/2021 0400 Gross per 24 hour  Intake 951.84 ml  Output 1370 ml  Net -418.16 ml   Filed Weights   04/10/21 0500 04/11/21 0500 04/12/21 0430  Weight: 91.2 kg 91.2 kg 91.8 kg   Examination: Gen:      No acute distress HEENT:  EOMI, sclera anicteric Lungs:   On 3 li Marissa. Lungs are clear to auscultation bilaterally; normal respiratory effort CV:         Regular rate and rhythm; no murmurs Abd:      Cholecystostomy tube in place. Brown fluid in gravity bag                + bowel sounds; soft, non-tender Ext:    No edema; adequate peripheral perfusion Skin:  Warm and dry; no rash Neuro:  Alert and oriented x3  Labs/imaging that I havepersonally reviewed  (right click and "Reselect all SmartList Selections" daily)  WBC 9, BG 75, LFT nl.  Echo 5/11: LVEF 55 to 60%, no regional wall motion abnormalities grade 1 DD. Peak RV-RA gradient 31 mmHg. RV systolic function is mildly reduced.and RV size is moderately enlarged. Mildly  D-shaped interventricular septum suggests a degree of RV pressure/volume overload.    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 for sepsis shock 2/2 acute cholecystitis and also acute on chronic respiratory failure requiring intubation. He transferred to ICU. Now extubated, off the pressor and stable.  Septic shock 2/2 acute gallstone cholecystitis: Resolved. Now off pressor and IVF. S/p cholecystostomy tube by IR. On Ceftriaxone and Flagyl. No fever overnight, no leukocytosis. BC negative to date.  -Continue Rocephin and flagyl. (5/09>>) -S/p drain placement by IR  -CMP daily -Strict I and Os -Currently npo pending swallow eval by speech -Consider consulting surgery when more stable and before discharge for evaluation for elective cholecystectomy likely outpatient -Now off pressor and IVF and BP stable. Will place order to transfer to the floor and hospitalist team.  Acute on chronic respiratory failure: Now extubated and 4 l HFNC. Was likely induced by opioid for pain control in hospital, also in setting of chronic respiratory failure (on1.5 li O2 , [place] at home since COVID infection per wife) and infection.  -Successfully extubated 5/12. Currently on 3 li HFNC  Elevated Trop and BNP: Trop on arrival was 157. Tended down but repeated EKG 5/10 showed new T investions in V2-V6). Demand vs Takotsubo. Cardiology consulted. Echo is pending. Will decide about ischemic eval depends on echo result. Cardiac Hx: Three-vessel coronary artery calcifications on CT 09/17/2020. Echo 08/26/2020: nl LVEF 60-65%. Grade 1DD. Aortic dilation. Patient had a Left heart cath back in 2016 per chart. Denies any chest pain.  -Echo 5/11: LVEF 55 to 60%, no regional wall motion abnormalities grade 1 DD. Peak RV-RA gradient 31 mmHg. RV systolic function is mildly reduced.and RV size is moderately enlarged. Mildly  D-shaped interventricular septum suggests a degree of RV pressure/volume overload.   -Appreciate cardiology recommendations  Mild Urinary Retention  2/2 Hypospadias: Urology was consulted and placed foley cath for accurate urine out put measure.  ID DM2: On metformin, Toujeo, Januvia, sliding scale at home BG this AM at 200. Increasing Insulin.  -Increased lantus from 15 u to 18 u QD yesterday while he was on tube feeding and due to hyperglycemia. He was then extubated and OG tube removed. He failed bedside swallow eval and remained NPO as swallow eval by speech not done yet. Had an episode of hypoglycemia 47while he was NPO and received D50. BG now around 70-90. Will hold this AM long acting insulin until start diet.  -On moderate SSI  -CBG monitoring. Blood Glucose goal 140-180. -Holding AM long acting insulin given hypoglycemia. Will resume Iong acting insulin when able to eat and depends on BG number.   Hx of HTN: Held home meds ona admission due to shock -currently normotensive. Monitor BP  Lung Nodules RLL Seen on 5/4 CT scan, 6 mm and 7 mm noncalcified right basilar lung nodules -Follow up CT scan in 3-6 months -Pulmonary  follow up. Seen by Dr. Melvyn Novas previously   History of DVT: Patient developed DVT during COVID-19 infection  He was on Eliquis that was discontinued by admission around few weeks ago. -On subcutaneous Lovenox on SCDs for VT prophylaxis.  Best practice (right click and "Reselect all SmartList Selections" daily)  Diet:  Oral Pain/Anxiety/Delirium protocol (if indicated): No VAP protocol (if indicated): Not indicated DVT prophylaxis: LMWH GI prophylaxis: PPI Glucose control:  SSI Yes and Basal insulin Yes Central venous access:  N/A Arterial line:  N/A Foley:  Yes, and it is still needed Mobility:  OOB  PT consulted: N/A Last date of multidisciplinary goals of care discussion [5/10] Code Status:  full code Disposition: Transfer to the floor when bed available  Critical care time:     Dewayne Hatch, MD [id number] 04/12/2021, 7:46 AM Pager: [phone]  Attending note: I have seen and examined  the patient. History, labs and imaging reviewed.   70 Y/O with COVID, DVT, admitted with sepsis secondary to acute cholecystitis Transferred to [facility] for perc biliary drain. Not a surgical caididate Extubated yesterday. He has been stable overnight  Blood pressure 132/77, pulse 90, temperature 98.7 F (37.1 C), temperature source Oral, resp. rate (!) 33, height 5\' 10"  (1.778 m), weight 91.8 kg, SpO2 96 %. Gen:      No acute distress HEENT:  EOMI, sclera anicteric Neck:     No masses; no thyromegaly Lungs:    Clear to auscultation bilaterally; normal respiratory effort CV:         Regular rate and rhythm; no murmurs Abd:      Mild abd tenderness. No guarding or rigidity Ext:    No edema; adequate peripheral perfusion Skin:      Warm and dry; no rash Neuro: alert and oriented x 3  Labs/Imaging personally reviewed, significant for Labs are stable, no new imaging  Assessment/plan: Sepsis present on admission Septic shock secondary to cholecystitis Off pressors since yesterday On antibiotics for 7 days. Follow cultures  Elevated troponins Likely demand Cardiology on board. Will need stress test in future  Acute on chronic respiratory failure Post-COVID ILD Stable post extubation Sputum cultures noted with GPCs. Will await final cultuires No need for vanco at this point as MRSA swab is negative  Nutrition Passed swallow eval Starting liquid diet PT ordered  Stable for transfer out of ICU and to hospitalist service. PCCM off 5/13  Marshell Garfinkel MD [place] [facility] 04/12/2021, 9:22 AM

## 2021-04-12 NOTE — Progress Notes (Addendum)
Nutrition Follow-up  DOCUMENTATION CODES:   Not applicable  INTERVENTION:    Ensure Enlive po TID, each supplement provides 350 kcal and 20 grams of protein  NUTRITION DIAGNOSIS:   Inadequate oral intake related to inability to eat as evidenced by NPO status.  Ongoing, diet just advanced to full liquids.  GOAL:   Patient will meet greater than or equal to 90% of their needs  Progressing   MONITOR:   Vent status,TF tolerance,Labs  REASON FOR ASSESSMENT:   Ventilator,Consult Enteral/tube feeding initiation and management  ASSESSMENT:   70 yo male admitted with sepsis, cholecystitis. S/P cholecystostomy drain placement on admission. PMH includes COVID with chronic respiratory failure, melanoma, DM-2, reflux, HTN, HLD.  Discussed patient in ICU rounds and with RN today. Patient was extubated 5/11. TF off since extubation. S/P bedside swallow evaluation with SLP this morning. Diet has been advanced to full liquids. Patient requested no solid foods at this time.  Will add PO supplements to maximize oral intake.   Drain output 70 ml x 24 h  Labs reviewed.  CBG: [id number]  Medications reviewed and include Pepcid, Novolog SSI, IV antibiotics.  Weights stable.   Diet Order:   Diet Order            Diet full liquid Room service appropriate? Yes; Fluid consistency: Thin  Diet effective now                 EDUCATION NEEDS:   Not appropriate for education at this time  Skin:  Skin Assessment: Reviewed RN Assessment  Last BM:  5/12 type 6  Height:   Ht Readings from Last 1 Encounters:  04/09/21 5\' 10"  (1.778 m)    Weight:   Wt Readings from Last 1 Encounters:  04/12/21 91.8 kg    Ideal Body Weight:  75.5 kg  BMI:  Body mass index is 29.04 kg/m.  Estimated Nutritional Needs:   Kcal:  2100-2300  Protein:  105-130 gm  Fluid:  >/= 2 L    Lucas Mallow, RD, LDN, CNSC Please refer to Amion for contact information.
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
## 2021-04-12 NOTE — Progress Notes (Signed)
Progress Note  Patient Name: Tim Day Date of Encounter: 04/12/2021  [facility] Cardiologist: Minus Breeding, MD  Subjective   Extubated; denies CP or dyspnea  Inpatient Medications    Scheduled Meds: . aspirin  81 mg Per NG tube Daily  . chlorhexidine gluconate (MEDLINE KIT)  15 mL Mouth Rinse BID  . Chlorhexidine Gluconate Cloth  6 each Topical Daily  . enoxaparin (LOVENOX) injection  40 mg Subcutaneous Q24H  . famotidine  20 mg Oral Daily  . feeding supplement (PROSource TF)  45 mL Per Tube Daily  . insulin aspart  0-15 Units Subcutaneous Q4H  . insulin glargine  18 Units Subcutaneous Daily  . mouth rinse  15 mL Mouth Rinse BID  . rosuvastatin  5 mg Per Tube Daily  . sodium chloride flush  5 mL Intracatheter Q8H   Continuous Infusions: . cefTRIAXone (ROCEPHIN)  IV Stopped (04/11/21 1742)  . dexmedetomidine (PRECEDEX) IV infusion Stopped (04/11/21 1121)  . feeding supplement (VITAL AF 1.2 CAL) Stopped (04/11/21 1120)  . metronidazole 500 mg (04/12/21 0000)   PRN Meds: acetaminophen **OR** acetaminophen, docusate, fentaNYL, fentaNYL (SUBLIMAZE) injection, ondansetron **OR** ondansetron (ZOFRAN) IV, polyethylene glycol   Vital Signs    Vitals:   04/12/21 0500 04/12/21 0530 04/12/21 0600 04/12/21 0630  BP: 133/79 128/78 118/69   Pulse: 92 92 93 97  Resp: (!) 38 (!) 32 (!) 29 (!) 33  Temp:      TempSrc:      SpO2: 100% 100% 99% 99%  Weight:      Height:        Intake/Output Summary (Last 24 hours) at 04/12/2021 0729 Last data filed at 04/12/2021 0400 Gross per 24 hour  Intake 951.84 ml  Output 1370 ml  Net -418.16 ml   Last 3 Weights 04/12/2021 04/11/2021 04/10/2021  Weight (lbs) 202 lb 6.1 oz 201 lb 1 oz 201 lb 1 oz  Weight (kg) 91.8 kg 91.2 kg 91.2 kg      Telemetry    Sinus with PACs- Personally Reviewed   Physical Exam   GEN: WD, WN, NAD Neck: supple Cardiac: RRR, no murmur Respiratory: CTA GI: Soft, NT MS: No edema Neuro:   Grossly intact Psych: answers questions appropriately  Labs    High Sensitivity Troponin:   Recent Labs  Lab 04/09/21 1608 04/09/21 2235 04/10/21 0455 04/10/21 1058 04/10/21 1222  TROPONINIHS 148* 119* 89* 60* 60*      Chemistry Recent Labs  Lab 04/10/21 0455 04/11/21 0910 04/12/21 0020  NA 133* 138 141  K 3.6 4.1 3.8  CL 100 106 105  CO2 _0 GLUCOSE 182* 225* 78  BUN 26* 25* 19  CREATININE 0.80 0.77 0.70  CALCIUM 8.5* 8.2* 8.3*  PROT 5.4* 4.8* 5.0*  ALBUMIN 2.0* 1.8* 2.0*  AST _1 ALT _2 ALKPHOS 67 54 55  BILITOT 0.6 <0.1* 0.3  GFRNONAA >60 >60 >60  ANIONGAP _3 Hematology Recent Labs  Lab 04/10/21 0455 04/11/21 0910 04/12/21 0020  WBC 15.8* 9.3 9.0  RBC 4.57 4.36 4.39  HGB 12.7* 12.3* 12.3*  HCT 39.4 38.3* 38.8*  MCV 86.2 87.8 88.4  MCH 27.8 28.2 28.0  MCHC 32.2 32.1 31.7  RDW 15.1 15.1 14.8  PLT 296 216 237    BNP Recent Labs  Lab 04/09/21 1149  BNP 348.0*     Radiology    ECHOCARDIOGRAM COMPLETE  Result Date: 04/11/2021  ECHOCARDIOGRAM REPORT   Patient Name:   Tim Day Date of Exam: 04/11/2021 Medical Rec #:  [id number]          Height:       70.0 in Accession #:    [id number]         Weight:       201.1 lb Date of Birth:  1951-09-29          BSA:          2.092 m Patient Age:    70 years           BP:           98/61 mmHg Patient Gender: M                  HR:           81 bpm. Exam Location:  Inpatient Procedure: 2D Echo and Intracardiac Opacification Agent Indications:    abnormal ECG  History:        Patient has prior history of Echocardiogram examinations, most                 recent 08/26/2020. Sepsis; Risk Factors:Hypertension,                 Dyslipidemia and Diabetes.  Sonographer:    Johny Chess Referring Phys: Dewayne Hatch  Sonographer Comments: Echo performed with patient supine and on artificial respirator. IMPRESSIONS  1. Left ventricular ejection fraction, by estimation, is 55 to  60%. The left ventricle has normal function. The left ventricle has no regional wall motion abnormalities. Left ventricular diastolic parameters are consistent with Grade I diastolic dysfunction (impaired relaxation).  2. Peak RV-RA gradient 31 mmHg. Right ventricular systolic function is mildly reduced. The right ventricular size is moderately enlarged. Mildly D-shaped interventricular septum suggests a degree of RV pressure/volume overload.  3. Right atrial size was mildly dilated.  4. The mitral valve is normal in structure. No evidence of mitral valve regurgitation. No evidence of mitral stenosis.  5. The aortic valve is tricuspid. Aortic valve regurgitation is not visualized. Mild aortic valve sclerosis is present, with no evidence of aortic valve stenosis.  6. Aortic dilatation noted. There is mild dilatation of the aortic root, measuring 38 mm.  7. The IVC was not visualize. FINDINGS  Left Ventricle: Left ventricular ejection fraction, by estimation, is 55 to 60%. The left ventricle has normal function. The left ventricle has no regional wall motion abnormalities. Definity contrast agent was given IV to delineate the left ventricular  endocardial borders. The left ventricular internal cavity size was normal in size. There is no left ventricular hypertrophy. Left ventricular diastolic parameters are consistent with Grade I diastolic dysfunction (impaired relaxation). Right Ventricle: Peak RV-RA gradient 31 mmHg. The right ventricular size is moderately enlarged. No increase in right ventricular wall thickness. Right ventricular systolic function is mildly reduced. Left Atrium: Left atrial size was normal in size. Right Atrium: Right atrial size was mildly dilated. Pericardium: There is no evidence of pericardial effusion. Mitral Valve: The mitral valve is normal in structure. There is mild calcification of the mitral valve leaflet(s). Mild mitral annular calcification. No evidence of mitral valve  regurgitation. No evidence of mitral valve stenosis. Tricuspid Valve: The tricuspid valve is normal in structure. Tricuspid valve regurgitation is trivial. Aortic Valve: The aortic valve is tricuspid. Aortic valve regurgitation is not visualized. Mild aortic valve sclerosis is present, with no evidence of aortic valve stenosis. Pulmonic Valve: The  pulmonic valve was not well visualized. Pulmonic valve regurgitation is not visualized. Aorta: Aortic dilatation noted. There is mild dilatation of the aortic root, measuring 38 mm. Venous: The inferior vena cava was not well visualized. IAS/Shunts: No atrial level shunt detected by color flow Doppler.  LEFT VENTRICLE PLAX 2D LVIDd:         4.00 cm  Diastology LVIDs:         2.10 cm  LV e' medial:    8.59 cm/s LV PW:         0.90 cm  LV E/e' medial:  7.3 LV IVS:        0.90 cm  LV e' lateral:   9.79 cm/s LVOT diam:     2.20 cm  LV E/e' lateral: 6.4 LV SV:         60 LV SV Index:   29 LVOT Area:     3.80 cm  RIGHT VENTRICLE RV S prime:     13.90 cm/s TAPSE (M-mode): 2.4 cm LEFT ATRIUM             Index       RIGHT ATRIUM           Index LA diam:        3.10 cm 1.48 cm/m  RA Area:     18.90 cm LA Vol (A2C):   65.9 ml 31.50 ml/m RA Volume:   51.80 ml  24.76 ml/m LA Vol (A4C):   51.3 ml 24.52 ml/m LA Biplane Vol: 61.0 ml 29.16 ml/m  AORTIC VALVE LVOT Vmax:   80.30 cm/s LVOT Vmean:  61.400 cm/s LVOT VTI:    0.157 m  AORTA Ao Root diam: 3.80 cm Ao Asc diam:  3.50 cm MITRAL VALVE               TRICUSPID VALVE MV Area (PHT): 3.99 cm    TR Peak grad:   31.4 mmHg MV Decel Time: 190 msec    TR Vmax:        280.00 cm/s MV E velocity: 62.60 cm/s MV A velocity: 61.30 cm/s  SHUNTS MV E/A ratio:  1.02        Systemic VTI:  0.16 m                            Systemic Diam: 2.20 cm Loralie Champagne MD Electronically signed by Loralie Champagne MD Signature Date/Time: 04/11/2021/1:39:13 PM    Final     Patient Profile     70 year old male with past medical history of diabetes mellitus,  hypertension, COVID pneumonia requiring home oxygen, admitted with probable cholecystitis/sepsis for evaluation of elevated troponin and abnormal ECG.  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 Patient admitted with probable cholecystitis.  He developed respiratory distress and was intubated. Troponin minimally elevated and electrocardiogram shows new anterior T wave inversion.  Cardiology asked to evaluate.  Follow-up echocardiogram this admission shows ejection fraction 55 to 94%, grade 1 diastolic dysfunction, moderate right ventricular enlargement, mild RV dysfunction, mild right atrial enlargement and mildly dilated aortic root.  Assessment & Plan    1 minimally elevated troponin/anterior T wave inversion noted on ECG-patient denies chest pain.  Elevated troponin likely secondary to demand ischemia.  Echocardiogram shows no wall motion abnormalities.  Electrocardiogram did show anterior T wave inversion possibly secondary to RV strain.  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  We will plan to proceed with stress nuclear study after he recovers from cholecystitis.  Continue aspirin, statin.    2 cholecystitis/sepsis-continue antibiotics.  Appears to be improving.    For questions or updates, please contact [facility] Please consult [email or web address] for contact info under        Signed, Kirk Ruths, MD  04/12/2021, 7:29 AM

## 2021-04-12 NOTE — Progress Notes (Signed)
Progress note: I talked to general surgery one the phone, to have their recommendation about elective cholecystectomy later. They recommended to have patient follow up outpatient with the same surgeon (Dr. Arnoldo Morale) who saw patient at [facility]. Keep the drain by then or per Dr. Arnoldo Morale recommendation).[facility] hospitalist physician is update.  Dewayne Hatch, MD [id number] 04/12/2021, 11:31 AM Pager: [phone]

## 2021-04-13 ENCOUNTER — Telehealth: Payer: Self-pay | Admitting: Student

## 2021-04-13 DIAGNOSIS — I10 Essential (primary) hypertension: Secondary | ICD-10-CM

## 2021-04-13 DIAGNOSIS — J9611 Chronic respiratory failure with hypoxia: Secondary | ICD-10-CM

## 2021-04-13 LAB — CBC
HCT: 40.9 % (ref 39.0–52.0)
Hemoglobin: 12.4 g/dL — ABNORMAL LOW (ref 13.0–17.0)
MCH: 27.4 pg (ref 26.0–34.0)
MCHC: 30.3 g/dL (ref 30.0–36.0)
MCV: 90.5 fL (ref 80.0–100.0)
Platelets: 251 10*3/uL (ref 150–400)
RBC: 4.52 MIL/uL (ref 4.22–5.81)
RDW: 14.7 % (ref 11.5–15.5)
WBC: 9 10*3/uL (ref 4.0–10.5)
nRBC: 0 % (ref 0.0–0.2)

## 2021-04-13 LAB — COMPREHENSIVE METABOLIC PANEL
ALT: 34 U/L (ref 0–44)
AST: 29 U/L (ref 15–41)
Albumin: 2 g/dL — ABNORMAL LOW (ref 3.5–5.0)
Alkaline Phosphatase: 53 U/L (ref 38–126)
Anion gap: 6 (ref 5–15)
BUN: 12 mg/dL (ref 8–23)
CO2: 32 mmol/L (ref 22–32)
Calcium: 8.6 mg/dL — ABNORMAL LOW (ref 8.9–10.3)
Chloride: 104 mmol/L (ref 98–111)
Creatinine, Ser: 0.7 mg/dL (ref 0.61–1.24)
GFR, Estimated: >60 mL/min (ref >60)
Glucose, Bld: 163 mg/dL — ABNORMAL HIGH (ref 70–99)
Potassium: 4.1 mmol/L (ref 3.5–5.1)
Sodium: 142 mmol/L (ref 135–145)
Total Bilirubin: 0.2 mg/dL — ABNORMAL LOW (ref 0.3–1.2)
Total Protein: 5 g/dL — ABNORMAL LOW (ref 6.5–8.1)

## 2021-04-13 LAB — GLUCOSE, CAPILLARY
Glucose-Capillary: 233 mg/dL — ABNORMAL HIGH (ref 70–99)
Glucose-Capillary: 233 mg/dL — ABNORMAL HIGH (ref 70–99)
Glucose-Capillary: 180 mg/dL — ABNORMAL HIGH (ref 70–99)
Glucose-Capillary: 132 mg/dL — ABNORMAL HIGH (ref 70–99)

## 2021-04-13 NOTE — Progress Notes (Signed)
Supervising Physician: Sandi Mariscal  Patient Status:  [facility] - In-pt  Chief Complaint:  Acute cholecystitis s/p cholecystostomy tube placed on 5.9.22 by Dr. Dwaine Gale  Subjective:  70 y.o. male inpatient. History of DM, chronic respiratory failure. presented to the ED at [facility] with abdominal pain with concern for acute cholecystitis. Complicated by respiratory arrest and transferred to [facility]. IR placed a cholecystostomy tube on 5..22. Patient now extubated. Wife at bedside. Currently without any significant complaints. Patient alert and sitting in recliner. States that he is feeling better. Denies abdominal pain, nausea, vomiting.  Allergies: Patient has no known allergies.  Medications: Prior to Admission medications   Medication Sig Start Date End Date Taking? Authorizing Provider  albuterol (VENTOLIN HFA) 108 (90 Base) MCG/ACT inhaler Inhale 2 puffs into the lungs every 6 (six) hours as needed for wheezing or shortness of breath. 09/28/20  Yes Oswald Hillock, MD  alclomethasone (ACLOVATE) 0.05 % cream Apply topically 2 (two) times daily as needed (Rash). 03/13/21  Yes Sheffield, Ronalee Red, PA-C  ALPRAZolam (XANAX) 0.25 MG tablet Take 0.25 mg by mouth 2 (two) times daily as needed for anxiety.   Yes [provider]  alum & mag hydroxide-simeth (MAALOX/MYLANTA) 200-200-20 MG/5ML suspension Take 30 mLs by mouth every 4 (four) hours as needed for indigestion. 09/28/20  Yes Oswald Hillock, MD  ascorbic acid (VITAMIN C) 500 MG tablet Take 1 tablet (500 mg total) by mouth daily. 08/04/20  Yes Johnson, Clanford L, MD  Cholecalciferol (VITAMIN D3) 50 MCG (2000 UT) TABS Take 1 tablet by mouth daily.   Yes [provider]  co-enzyme Q-10 50 MG capsule Take 50 mg by mouth 2 (two) times daily.   Yes [provider]  famotidine (PEPCID) 20 MG tablet Take 20 mg by mouth daily. 03/13/21  Yes [provider]  FIASP FLEXTOUCH 100 UNIT/ML FlexTouch Pen Inject 1-3 Units into the skin  every evening. 02/26/21  Yes [provider]  furosemide (LASIX) 20 MG tablet Take 20 mg by mouth daily. 03/28/21  Yes [provider]  ibuprofen (ADVIL) 600 MG tablet Take 1 tablet (600 mg total) by mouth every 6 (six) hours as needed. 04/06/21  Yes Melynda Ripple, MD  JANUVIA 100 MG tablet Take 100 mg by mouth daily. 03/13/21  Yes [provider]  ketoconazole (NIZORAL) 2 % cream Apply 1 application topically 2 (two) times daily. 03/13/21 04/24/21 Yes Sheffield, Ronalee Red, PA-C  metFORMIN (GLUCOPHAGE) 1000 MG tablet Take 1,000 mg by mouth 2 (two) times daily with a meal.   Yes [provider]  Multiple Vitamin (MULTIVITAMIN) capsule Take 1 capsule by mouth daily.   Yes [provider]  olmesartan-hydrochlorothiazide (BENICAR HCT) 20-12.5 MG tablet Take 1 tablet by mouth daily. 03/05/21  Yes [provider]  rosuvastatin (CRESTOR) 5 MG tablet Take 5 mg by mouth daily. 03/05/21  Yes [provider]  sildenafil (VIAGRA) 100 MG tablet Take 100 mg by mouth at bedtime. 02/26/21  Yes [provider]  TOUJEO MAX SOLOSTAR 300 UNIT/ML Solostar Pen Inject 30 Units into the skin in the morning. 02/26/21  Yes [provider]  zinc sulfate 220 (50 Zn) MG capsule Take 1 capsule (220 mg total) by mouth daily. 08/04/20  Yes Johnson, Clanford L, MD  zolpidem (AMBIEN) 10 MG tablet Take 10 mg by mouth at bedtime as needed. 02/27/21  Yes [provider]  ACCU-CHEK GUIDE test strip USE TO TEST TWICE DAILY.D 02/06/21   [provider]  blood glucose meter kit and supplies KIT Dispense based on patient and insurance preference. Use up to four times daily as directed. (FOR ICD-9 250.00, 250.01). 08/14/20   Little Ishikawa, MD  insulin aspart (NOVOLOG) 100 UNIT/ML injection Inject 0-15 Units into the skin 3 (three) times daily with meals. Sliding scale insulin Less than 70 initiate hypoglycemia protocol 70-120 - 0 units,  120-150-  2 units,   151-200 -3 units,  201-250- 5 units,  251-300- 8 units,  301-350- 11 units,  351-400- 15 units Greater than 400 call MD Patient not taking: No sig reported 09/28/20   Oswald Hillock, MD  Insulin Pen Needle (PEN NEEDLES) 32G X 4 MM MISC 1 Package by Does not apply route 4 (four) times daily -  before meals and at bedtime. 08/14/20   Little Ishikawa, MD  ondansetron (ZOFRAN) 4 MG tablet Take 1 tablet (4 mg total) by mouth every 6 (six) hours as needed for nausea. Patient not taking: Reported on 04/09/2021 09/28/20   Oswald Hillock, MD  pantoprazole (PROTONIX) 40 MG tablet Take 40 mg by mouth daily. Patient not taking: Reported on 04/09/2021    [provider]     Vital Signs: BP (!) 150/88 (BP Location: Left Arm)   Pulse 96   Temp 98 F (36.7 C) (Oral)   Resp 17   Ht '5\' 10"'  (1.778 m)   Wt 202 lb 6.1 oz (91.8 kg)   SpO2 96%   BMI 29.04 kg/m   Physical Exam Vitals and nursing note reviewed.  Constitutional:      Appearance: He is well-developed.  HENT:     Head: Normocephalic.  Pulmonary:     Effort: Pulmonary effort is normal.  Abdominal:     Comments: Positive RUQ drain to gravity bag. Site is unremarkable with no erythema, edema, tenderness, bleeding or drainage noted at exit site. Suture and stat lock in place. Dressing is clean dry and intact. 25 ml of   brownish colored fluid noted in gravity bag. Drain is able to be flushed easily.   Musculoskeletal:        General: Normal range of motion.     Cervical back: Normal range of motion.  Skin:    General: Skin is dry.  Neurological:     Mental Status: He is alert and oriented to person, place, and time.     Imaging: IR Perc Cholecystostomy  Result Date: 04/10/2021 INDICATION: 70 year old gentleman presented to the ED with complaints of abdominal pain. Imaging findings suspicious for cholecystitis. EXAM: Ultrasound and fluoroscopic guided cholecystostomy drain placement MEDICATIONS: Ceftriaxone 2 g IV; The  antibiotic was administered within an appropriate time frame prior to the initiation of the procedure. ANESTHESIA/SEDATION: None FLUOROSCOPY TIME:  Fluoroscopy Time: 0 minutes 36 seconds (8 mGy). COMPLICATIONS: None immediate. PROCEDURE: Informed written consent was obtained from the patient's wife after a thorough discussion of the procedural risks, benefits and alternatives. All questions were addressed. Maximal Sterile Barrier Technique was utilized including caps, mask, sterile gowns, sterile gloves, sterile drape, hand hygiene and skin antiseptic. A timeout was performed prior to the initiation of the procedure. Patient positioned supine on the procedure table. Right upper quadrant skin prepped and draped in usual fashion. Sterile ultrasound probe cover and gel utilized throughout the procedure. Following local lidocaine administration, 19 gauge Yueh needle advanced into the gallbladder lumen utilizing continuous ultrasound guidance. Yueh catheter exchanged for 10.2 [place] multipurpose pigtail drain over 0.035 inch guidewire. 10  mL of dark brown bile aspirated and sent for Gram stain and culture. Drain secured to skin with suture and connected to bag. IMPRESSION: 10.2 French cholecystostomy drain placed utilizing fluoroscopic and ultrasound guidance. Electronically Signed   By: Miachel Roux M.D.   On: 04/10/2021 09:26   DG CHEST PORT 1 VIEW  Result Date: 04/10/2021 CLINICAL DATA:  70 year old male endotracheal tube adjusted. EXAM: PORTABLE CHEST 1 VIEW COMPARISON:  0123 hours today and earlier. FINDINGS: Portable AP semi upright view at 0307 hours. Endotracheal tube tip now projects at the level the clavicles, 2-3 cm above the carina allowing for somewhat more kyphotic positioning. Enteric tube courses to the abdomen, tip not included. Continued low lung volumes with stable ventilation. Stable cardiac size and mediastinal contours. No pneumothorax or pleural effusion is evident. IMPRESSION: 1. Endotracheal  tube tip satisfactory at the level the clavicles. Enteric tube courses to the abdomen, tip not included. 2. Continued low lung volumes with stable ventilation. Electronically Signed   By: Genevie Ann M.D.   On: 04/10/2021 04:04   DG CHEST PORT 1 VIEW  Result Date: 04/10/2021 CLINICAL DATA:  70 year old male status post intubation EXAM: PORTABLE CHEST 1 VIEW COMPARISON:  Earlier radiograph dated 04/09/2021. FINDINGS: Endotracheal tube with tip at the level of the carina. Recommend retraction by approximately 4 cm for optimal positioning. Enteric tube extends below the diaphragm with side-port in the proximal stomach and tip beyond the inferior margin of the image. Shallow inspiration. No significant interval change in bilateral streaky pulmonary densities, left greater right. No pleural effusion pneumothorax. Stable cardiomediastinal silhouette. No acute osseous pathology. IMPRESSION: 1. Endotracheal tube with tip at the level of the carina. Recommend retraction by approximately 4 cm for optimal positioning. 2. No significant interval change in the bilateral pulmonary densities. Electronically Signed   By: Anner Crete M.D.   On: 04/10/2021 01:39   DG Chest Port 1 View  Result Date: 04/09/2021 CLINICAL DATA:  Check endotracheal tube placement EXAM: PORTABLE CHEST 1 VIEW COMPARISON:  Film from earlier in the same day. FINDINGS: Cardiac shadow is stable. Endotracheal tube is noted at the level of the carina. This could be withdrawn 1-2 cm. Gastric catheter is noted within the stomach overall inspiratory effort is poor with bibasilar atelectasis and chronic fibrotic changes. IMPRESSION: New bibasilar atelectatic changes. Tubes and lines as described above. The endotracheal tube could be withdrawn 1-2 cm. Electronically Signed   By: Inez Catalina M.D.   On: 04/09/2021 22:54   DG CHEST PORT 1 VIEW  Result Date: 04/09/2021 CLINICAL DATA:  Intubated EXAM: PORTABLE CHEST 1 VIEW COMPARISON:  04/09/2021, CT  04/04/2021, 08/29/2020 FINDINGS: Interval intubation, tip of endotracheal tube is at the carina. Low lung volumes with coarse slightly reticular opacities, likely due to chronic interstitial disease. Redemonstrated left upper lung nodule. Stable cardiomediastinal silhouette. No pneumothorax. Interim placement of esophageal tube, the side port projects over the mid gastric region. Lucency in the left upper quadrant presumably represents air distended dilated bowel. IMPRESSION: 1. Endotracheal tube tip at the carina 2. Esophageal tube side-port overlies the mid gastric region 3. Lucency in the left upper quadrant probably represents air dilated bowel, suggest dedicated abdominal radiograph for further evaluation These results will be called to the ordering clinician or representative by the Radiologist Assistant, and communication documented in the PACS or [employer]. Electronically Signed   By: Donavan Foil M.D.   On: 04/09/2021 19:38   ECHOCARDIOGRAM COMPLETE  Result Date: 04/11/2021  ECHOCARDIOGRAM REPORT   Patient Name:   [facility] Date of Exam: 04/11/2021 Medical Rec #:  [id number]          Height:       70.0 in Accession #:    [id number]         Weight:       201.1 lb Date of Birth:  [birth year]          BSA:          2.092 m Patient Age:    67 years           BP:           98/61 mmHg Patient Gender: M                  HR:           81 bpm. Exam Location:  Inpatient Procedure: 2D Echo and Intracardiac Opacification Agent Indications:    abnormal ECG  History:        Patient has prior history of Echocardiogram examinations, most                 recent 08/26/2020. Sepsis; Risk Factors:Hypertension,                 Dyslipidemia and Diabetes.  Sonographer:    Johny Chess Referring Phys: Dewayne Hatch  Sonographer Comments: Echo performed with patient supine and on artificial respirator. IMPRESSIONS  1. Left ventricular ejection fraction, by estimation, is 55 to 60%. The left ventricle  has normal function. The left ventricle has no regional wall motion abnormalities. Left ventricular diastolic parameters are consistent with Grade I diastolic dysfunction (impaired relaxation).  2. Peak RV-RA gradient 31 mmHg. Right ventricular systolic function is mildly reduced. The right ventricular size is moderately enlarged. Mildly D-shaped interventricular septum suggests a degree of RV pressure/volume overload.  3. Right atrial size was mildly dilated.  4. The mitral valve is normal in structure. No evidence of mitral valve regurgitation. No evidence of mitral stenosis.  5. The aortic valve is tricuspid. Aortic valve regurgitation is not visualized. Mild aortic valve sclerosis is present, with no evidence of aortic valve stenosis.  6. Aortic dilatation noted. There is mild dilatation of the aortic root, measuring 38 mm.  7. The IVC was not visualize. FINDINGS  Left Ventricle: Left ventricular ejection fraction, by estimation, is 55 to 60%. The left ventricle has normal function. The left ventricle has no regional wall motion abnormalities. Definity contrast agent was given IV to delineate the left ventricular  endocardial borders. The left ventricular internal cavity size was normal in size. There is no left ventricular hypertrophy. Left ventricular diastolic parameters are consistent with Grade I diastolic dysfunction (impaired relaxation). Right Ventricle: Peak RV-RA gradient 31 mmHg. The right ventricular size is moderately enlarged. No increase in right ventricular wall thickness. Right ventricular systolic function is mildly reduced. Left Atrium: Left atrial size was normal in size. Right Atrium: Right atrial size was mildly dilated. Pericardium: There is no evidence of pericardial effusion. Mitral Valve: The mitral valve is normal in structure. There is mild calcification of the mitral valve leaflet(s). Mild mitral annular calcification. No evidence of mitral valve regurgitation. No evidence of mitral  valve stenosis. Tricuspid Valve: The tricuspid valve is normal in structure. Tricuspid valve regurgitation is trivial. Aortic Valve: The aortic valve is tricuspid. Aortic valve regurgitation is not visualized. Mild aortic valve sclerosis is present, with no evidence of aortic valve stenosis. Pulmonic Valve: The  pulmonic valve was not well visualized. Pulmonic valve regurgitation is not visualized. Aorta: Aortic dilatation noted. There is mild dilatation of the aortic root, measuring 38 mm. Venous: The inferior vena cava was not well visualized. IAS/Shunts: No atrial level shunt detected by color flow Doppler.  LEFT VENTRICLE PLAX 2D LVIDd:         4.00 cm  Diastology LVIDs:         2.10 cm  LV e' medial:    8.59 cm/s LV PW:         0.90 cm  LV E/e' medial:  7.3 LV IVS:        0.90 cm  LV e' lateral:   9.79 cm/s LVOT diam:     2.20 cm  LV E/e' lateral: 6.4 LV SV:         60 LV SV Index:   29 LVOT Area:     3.80 cm  RIGHT VENTRICLE RV S prime:     13.90 cm/s TAPSE (M-mode): 2.4 cm LEFT ATRIUM             Index       RIGHT ATRIUM           Index LA diam:        3.10 cm 1.48 cm/m  RA Area:     18.90 cm LA Vol (A2C):   65.9 ml 31.50 ml/m RA Volume:   51.80 ml  24.76 ml/m LA Vol (A4C):   51.3 ml 24.52 ml/m LA Biplane Vol: 61.0 ml 29.16 ml/m  AORTIC VALVE LVOT Vmax:   80.30 cm/s LVOT Vmean:  61.400 cm/s LVOT VTI:    0.157 m  AORTA Ao Root diam: 3.80 cm Ao Asc diam:  3.50 cm MITRAL VALVE               TRICUSPID VALVE MV Area (PHT): 3.99 cm    TR Peak grad:   31.4 mmHg MV Decel Time: 190 msec    TR Vmax:        280.00 cm/s MV E velocity: 62.60 cm/s MV A velocity: 61.30 cm/s  SHUNTS MV E/A ratio:  1.02        Systemic VTI:  0.16 m                            Systemic Diam: 2.20 cm Loralie Champagne MD Electronically signed by Loralie Champagne MD Signature Date/Time: 04/11/2021/1:39:13 PM    Final     Labs:  CBC: Recent Labs    04/10/21 0455 04/11/21 0910 04/12/21 0020 04/13/21 0438  WBC 15.8* 9.3 9.0 9.0  HGB  12.7* 12.3* 12.3* 12.4*  HCT 39.4 38.3* 38.8* 40.9  PLT 296 216 237 251    COAGS: Recent Labs    08/23/20 0400 08/23/20 1719 08/24/20 0423 08/25/20 0533 04/10/21 0455  INR  --   --   --   --  1.4*  APTT 84* 67* 77* 91*  --     BMP: Recent Labs    09/02/20 1635 09/03/20 0220 09/04/20 0247 09/05/20 0321 09/06/20 0202 04/10/21 0455 04/11/21 0910 04/12/21 0020 04/13/21 0438  NA 131* 131* 131* 131*   < > 133* 138 141 142  K 5.0 5.0 4.8 5.0   < > 3.6 4.1 3.8 4.1  CL 92* 91* 93* 94*   < > 100 106 105 104  CO2 32 '31 30 28   ' < > '26 27 27 ' 32  GLUCOSE 145* 230* 225* 159*   < > 182* 225* 78 163*  BUN '22 21 23 21   ' < > 26* 25* 19 12  CALCIUM 8.3* 8.4* 8.0* 8.3*   < > 8.5* 8.2* 8.3* 8.6*  CREATININE 0.52* 0.66 0.68 0.54*   < > 0.80 0.77 0.70 0.70  GFRNONAA >60 >60 >60 >60   < > >60 >60 >60 >60  GFRAA >60 >60 >60 >60  --   --   --   --   --    < > = values in this interval not displayed.    LIVER FUNCTION TESTS: Recent Labs    04/10/21 0455 04/11/21 0910 04/12/21 0020 04/13/21 0438  BILITOT 0.6 <0.1* 0.3 0.2*  AST '24 25 25 29  ' ALT '27 28 28 ' 34  ALKPHOS 67 54 55 53  PROT 5.4* 4.8* 5.0* 5.0*  ALBUMIN 2.0* 1.8* 2.0* 2.0*    Assessment and Plan:  70 y.o. male inpatient. History of DM, chronic respiratory failure. presented to the ED at [facility] with abdominal pain with concern for acute cholecystitis. Complicated by respiratory arrest and transferred to [facility]. IR placed a cholecystostomy tube on 5.9.22. Patient now extubated. Wife at bedside. Per Epic output is 5 ,ml, 80 ml, 70. Positive RUQ drain to gravity bag. Site is unremarkable with no erythema, edema, tenderness, bleeding or drainage noted at exit site. Suture and stat lock in place. Dressing is clean dry and intact. 25 ml of  brownish colored fluid noted in gravity bag. Drain is able to be flushed easily. aptient is afebrile, no leukocytosis. Blood cultures negative X 4 days.  Cholecystomy tube drain will need to remain in  place at least 4-6 weeks unless gallbaldder surgically removed in interim; continue tid drain flushes inhouse and once daily with 5 ml sterile normal saline as outpatient; will schedule f/u cholangiogram in 4-6 weeks.   Electronically Signed: Jacqualine Mau, NP 04/13/2021, 2:48 PM   I spent a total of 15 Minutes at the patient's bedside AND on the patient's hospital floor or unit, greater than 50% of which was counseling/coordinating care for acute cholecystitis s/p cholecystomy tube placement.

## 2021-04-13 NOTE — Progress Notes (Signed)
ERROR

## 2021-04-13 NOTE — Evaluation (Signed)
Physical Therapy Evaluation Patient Details Name: Tim Day MRN: [id number] DOB: 11-19-1951 Today's Date: 04/13/2021   History of Present Illness  Tim Day is a 70 y.o. male with PHMx:HTN, DM2, GERD, COVID in August 2021 resulting in DVT, chronic hypoxic respiratory failure using 1.5 to 2 L of nasal oxygen 24/7, hyperlipidemia presented to ED with a complaint of persistent right upper quadrant pain. Found to have sepsis secondary to acute gallstone cholecystitis.Became unresponsive while being worked up.  He had received a dose of Dilaudid.  Ultimately required intubation.  On arrival to [facility] went to interventional radiology for a cholecystostomy drain. Extubated 5/11  Clinical Impression  Pt known to therapist from [place] hospitalization. PT thrilled to hear level of function prior to this hospitalization. Pt independent with mobility walking 40 laps in 1800 ft building and on 1.5L O2. Pt is currently limited in safe mobility by R flank pain and generalized weakness and associated decreased balance and endurance. Pt is min guard for bed mobility, minA-mod I for transfers and min guard for ambulation of 300 feet with Rollator on 4L O2 via [place]. Given pt's main limiting factor is his pain, pt will not need any further PT services at discharge, however PT will continue to follow acutely to progress mobility.     Follow Up Recommendations No PT follow up;Supervision - Intermittent    Equipment Recommendations  None recommended by PT    Recommendations for Other Services OT consult     Precautions / Restrictions Precautions Precautions: Fall Precaution Comments: monitor O2 Restrictions Weight Bearing Restrictions: No      Mobility  Bed Mobility Overal bed mobility: Needs Assistance Bed Mobility: Supine to Sit     Supine to sit: Min guard;HOB elevated     General bed mobility comments: vc for management of lines    Transfers Overall transfer  level: Needs assistance   Transfers: Sit to/from Stand;Stand Pivot Transfers Sit to Stand: Min assist;Modified independent (Device/Increase time)         General transfer comment: good power up,  minA for steadying at EoB, mod I for power up from [facility] over toilet  Ambulation/Gait Ambulation/Gait assistance: Min assist Gait Distance (Feet): 300 Feet Assistive device: 1 person hand held assist;4-wheeled walker Gait Pattern/deviations: Step-through pattern;Decreased step length - right;Decreased step length - left;Shuffle;Trunk flexed Gait velocity: slowed Gait velocity interpretation: 1.31 - 2.62 ft/sec, indicative of limited community ambulator General Gait Details: min HHA for ambulation into and out of the bathroom, contact guard assist and management of lines and oxygen        Balance Overall balance assessment: Needs assistance Sitting-balance support: No upper extremity supported;Feet supported Sitting balance-Leahy Scale: Good     Standing balance support: Single extremity supported Standing balance-Leahy Scale: Fair                               Pertinent Vitals/Pain Pain Assessment: Faces Faces Pain Scale: Hurts a little bit Pain Location: right side where drain is, and stomach after eating Pain Descriptors / Indicators: Grimacing;Discomfort;Aching Pain Intervention(s): Limited activity within patient's tolerance;Monitored during session;Repositioned    Home Living Family/patient expects to be discharged to:: Private residence Living Arrangements: Spouse/significant other Available Help at Discharge: Family;Available 24 hours/day Type of Home: House Home Access: Stairs to enter Entrance Stairs-Rails: Psychiatric nurse of Steps: 6 Home Layout: One level Home Equipment: Walker - 2 wheels;Bedside commode;Shower seat;Hand held shower head Additional  Comments: On home O2 1.5 liters at rest; 3-4 liters with activity. Normally walks 40 laps  inside a 1800 sq foot building daily, sitting to rest prn.    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)     Hand Dominance   Dominant Hand: Right    Extremity/Trunk Assessment   Upper Extremity Assessment Upper Extremity Assessment: Defer to OT evaluation    Lower Extremity Assessment Lower Extremity Assessment: Overall WFL for tasks assessed       Communication   Communication: No difficulties  Cognition Arousal/Alertness: Awake/alert Behavior During Therapy: WFL for tasks assessed/performed Overall Cognitive Status: Within Functional Limits for tasks assessed                                        General Comments General comments (skin integrity, edema, etc.): Pt ambulated on 4L O2 via [place] with SaO2 >90%O2 2-3/4 DoE, with return to room able to titrate down to 2L O2 while maintaining SaO2 93%O2, VSS        Assessment/Plan    PT Assessment Patient needs continued PT services  PT Problem List Decreased activity tolerance;Decreased balance;Decreased strength;Decreased mobility;Cardiopulmonary status limiting activity       PT Treatment Interventions DME instruction;Gait training    PT Goals (Current goals can be found in the Care Plan section)  Acute Rehab PT Goals Patient Stated Goal: to be able to get up and down from the floor PT Goal Formulation: With patient/family Time For Goal Achievement: 04/27/21 Potential to Achieve Goals: Good    Frequency Min 3X/week    AM-PAC PT "6 Clicks" Mobility  Outcome Measure Help needed turning from your back to your side while in a flat bed without using bedrails?: None Help needed moving from lying on your back to sitting on the side of a flat bed without using bedrails?: None Help needed moving to and from a bed to a chair (including a wheelchair)?: A Little Help needed standing up  from a chair using your arms (e.g., wheelchair or bedside chair)?: A Little Help needed to walk in hospital room?: A Little Help needed climbing 3-5 steps with a railing? : A Little 6 Click Score: 20    End of Session Equipment Utilized During Treatment: Gait belt;Oxygen Activity Tolerance: Patient tolerated treatment well Patient left: in bed;with call bell/phone within reach;with bed alarm set;with family/visitor present Nurse Communication: Mobility status PT Visit Diagnosis: Muscle weakness (generalized) (M62.81);Difficulty in walking, not elsewhere classified (R26.2);Pain Pain - Right/Left: Right Pain - part of body:  (flank)    Time: 7902-4097 PT Time Calculation (min) (ACUTE ONLY): 48 min   Charges:   PT Evaluation $PT Eval Moderate Complexity: 1 Mod PT Treatments $Therapeutic Exercise: 8-22 mins $Therapeutic Activity: 8-22 mins        Tim Day B. Migdalia Dk PT, DPT Acute Rehabilitation Services Pager [phone] Office [phone]   [place] 04/13/2021, 4:13 PM

## 2021-04-13 NOTE — Evaluation (Signed)
Occupational Therapy Evaluation Patient Details Name: Tim Day MRN: [id number] DOB: 1951/07/25 Today's Date: 04/13/2021    History of Present Illness Tim Day is a 70 y.o. male with PHMx:HTN, DM2, GERD, COVID in August 2021 resulting in DVT, chronic hypoxic respiratory failure using 1.5 to 2 L of nasal oxygen 24/7, hyperlipidemia presented to ED with a complaint of persistent right upper quadrant pain. Found to have sepsis secondary to acute gallstone cholecystitis.Became unresponsive while being worked up.  He had received a dose of Dilaudid.  Ultimately required intubation.  On arrival to [facility]) went to interventional radiology for a cholecystostomy drain. Extubated 5/11   Clinical Impression   This 70 yo male admitted with above presents to acute OT with PLOF of being totally independent with all of his basic ADLs without DME/AE and on O2 1.5-4 liters, and ambulating 40 laps in an 1800 sq ft building daily. He currently is most limited by DOE with even minimal activity even on 4 liters of O2 and decreased balance thus affecting his safety and independence with basic ADLs and mobility. He will benefit from acute OT without need for follow up.     Follow Up Recommendations  No OT follow up;Supervision - Intermittent    Equipment Recommendations  None recommended by OT       Precautions / Restrictions Precautions Precautions: Fall Precaution Comments: monitor O2 Restrictions Weight Bearing Restrictions: No      Mobility Bed Mobility Overal bed mobility: Needs Assistance Bed Mobility: Supine to Sit     Supine to sit: Min guard;HOB elevated     General bed mobility comments: VCs for sequencing    Transfers Overall transfer level: Needs assistance Equipment used: 1 person hand held assist Transfers: Sit to/from [facility] Sit to Stand: Min assist Stand pivot transfers: Min assist            Balance Overall  balance assessment: Needs assistance Sitting-balance support: No upper extremity supported;Feet supported Sitting balance-Leahy Scale: Good     Standing balance support: Single extremity supported Standing balance-Leahy Scale: Poor Standing balance comment: Pt stated he felt unsteady if he did not have one hand on a surface to steady himself                           ADL either performed or assessed with clinical judgement   ADL Overall ADL's : Needs assistance/impaired Eating/Feeding: Independent;Sitting   Grooming: Set up;Sitting   Upper Body Bathing: Set up;Sitting   Lower Body Bathing: Moderate assistance Lower Body Bathing Details (indicate cue type and reason): min A sit<>stand Upper Body Dressing : Set up;Sitting   Lower Body Dressing: Moderate assistance Lower Body Dressing Details (indicate cue type and reason): min A sit<>stand Toilet Transfer: Minimal assistance;Stand-pivot;BSC   Toileting- Clothing Manipulation and Hygiene: Moderate assistance Toileting - Clothing Manipulation Details (indicate cue type and reason): min A sit<>stand       General ADL Comments: Educated on purse lipped breathing and use of inspirometer; RN to get in touch with respiratory about a flutter valve. Recommended he use his shower seat initally and work his way away from it as able. Also spoke about "forward chaining" getting dressed (ie: starting with just UB and wife doing LB then he does UB and his underwear and keeps adding pieces from there as he is able)     Vision Patient Visual Report: No change from baseline  Pertinent Vitals/Pain Pain Assessment: Faces Faces Pain Scale: Hurts a little bit Pain Location: right side where drain is     Hand Dominance  right   Extremity/Trunk Assessment Upper Extremity Assessment Upper Extremity Assessment: Overall WFL for tasks assessed           Communication  no issues   Cognition Arousal/Alertness:  Awake/alert Behavior During Therapy: WFL for tasks assessed/performed Overall Cognitive Status: Within Functional Limits for tasks assessed                                     General Comments  Pt on 4 liters of O2. At rest no SOB, with activity DOE 3:4. Sats at rest as high as 96 on the 4 liters with minimal activity (BSC>recliner 5 feet away) as low as 79            Home Living Family/patient expects to be discharged to:: Private residence Living Arrangements: Spouse/significant other Available Help at Discharge: Family;Available 24 hours/day Type of Home: House Home Access: Stairs to enter [employer] of Steps: 6 Entrance Stairs-Rails: Right;Left Home Layout: One level     Bathroom Shower/Tub: Occupational psychologist: Standard     Home Equipment: Environmental consultant - 2 wheels;Bedside commode;Shower seat;Hand held shower head   Additional Comments: On home O2 1.5 liters at rest; 3-4 liters with activity. Normally walks 40 laps inside a 1800 sq foot building daily, sitting to rest prn; wife is now retired.      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)        OT Problem List: Decreased activity tolerance;Impaired balance (sitting and/or standing);Cardiopulmonary status limiting activity      OT Treatment/Interventions: Self-care/ADL training;Energy conservation;Patient/family education;Balance training    OT Goals(Current goals can be found in the care plan section) Acute Rehab OT Goals Patient Stated Goal: to be able to get up and down from the floor OT Goal Formulation: With patient Time For Goal Achievement: 04/27/21 Potential to Achieve Goals: Good  OT Frequency: Min 2X/week              AM-PAC OT "6 Clicks" Daily Activity     Outcome Measure Help from another person eating meals?: None Help from  another person taking care of personal grooming?: A Little Help from another person toileting, which includes using toliet, bedpan, or urinal?: A Lot Help from another person bathing (including washing, rinsing, drying)?: A Lot Help from another person to put on and taking off regular upper body clothing?: A Little Help from another person to put on and taking off regular lower body clothing?: A Lot 6 Click Score: 16   End of Session Nurse Communication: Mobility status (request for flutter valve)  Activity Tolerance:  (minimal activity causes SOB) Patient left: in chair;with call bell/phone within reach;with chair alarm set;with family/visitor present  OT Visit Diagnosis: Unsteadiness on feet (R26.81);Other abnormalities of gait and mobility (R26.89)                Time: 0814-4818 OT Time Calculation (min): 37 min Charges:  OT General Charges $OT Visit: 1 Visit OT Evaluation $OT Eval Moderate Complexity: 1 Mod OT Treatments $Self Care/Home Management : 8-22 mins  [place]en Circle, OTR/L Acute [employer] Pager [phone]  Office [phone]     Almon Register 04/13/2021, 10:27 AM

## 2021-04-13 NOTE — Progress Notes (Signed)
  Speech Language Pathology Treatment: Dysphagia  Patient Details Name: Tim Day MRN: [id number] DOB: [birth year] Today's Date: 04/13/2021 Time: 5929-2446 SLP Time Calculation (min) (ACUTE ONLY): 10.73 min  Assessment / Plan / Recommendation Clinical Impression  Pt was seen for dysphagia treatment and was cooperative throughout the session. Pt and his wife reported that the pt has been tolerating the current diet without overt s/sx of aspiration. Pt stated that he continues to expectorate secretions frequently, but that coughing has not been demonstrated with p.o. intake. Pt tolerated regular texture solids, and thin liquids via straw using individual and consecutive swallows without symptoms of oropharyngeal dysphagia. Pt indicated that odynophagia improved significantly yesterday and has now resolved. Pt's diet will be advanced to regular texture solids with thin liquids. Further skilled SLP services are not clinically indicated at this time.    HPI HPI: Pt is a 70 year old male transferred from [place] to [facility].  Patient was diagnosed with sepsis. Patient was found to have acute cholecystitis and became unresponsive while being worked up. ETT 5/9-5/11; Pt failed [facility] 5/11 due to coughing. Pt was last seen by SLP in September, 2021 and was discharged from SLP services on a regular texture and thin liquids on 08/30/20. PMH:  GERD, COVID (August 2021), DVT, Hypoxic respiratory failure.      SLP Plan  All goals met;Discharge SLP treatment due to (comment)       Recommendations  Diet recommendations: Regular;Thin liquid Liquids provided via: Cup;Straw Medication Administration: Whole meds with liquid Supervision: Patient able to self feed Postural Changes and/or Swallow Maneuvers: Seated upright 90 degrees                Oral Care Recommendations: Oral care BID Follow up Recommendations: None SLP Visit Diagnosis: Dysphagia, unspecified (R13.10) Plan: All goals  met;Discharge SLP treatment due to (comment)       Toia Micale I. Hardin Negus, [place], [place] Office number [phone] Pager [phone]                 Tim Day 04/13/2021, 9:05 AM

## 2021-04-13 NOTE — Progress Notes (Signed)
PROGRESS NOTE  Sem Tim Day UUV:[id number] DOB: 09-01-1951 DOA: 04/09/2021 PCP: Celene Squibb, MD   LOS: 4 days   Brief Narrative / Interim history: 70 year old male with history of COVID in November 4742 complicated by chronic hypoxic respiratory failure on 1.5-2 L at home, DM2, hypertension, prior DVT status post completed course of anticoagulation who is in the hospital with acute cholecystitis.  Patient initially presented to [facility] ED with abdominal pain, diagnosed with acute cholecystitis and general surgery was consulted.  Patient had respiratory decompensation shortly after admission requiring intubation and transferred to [facility] where he was in the ICU and he is status post IR placed percutaneous cholecystostomy.  He was successfully extubated and transferred to the hospitalist service on 5/13.  Subjective / 24h Interval events: He is doing well this morning, complains of mild shortness of breath, no abdominal pain, no nausea or vomiting.  Assessment & Plan: Principal Problem Septic shock due to acute gallstone cholecystitis -initially transferred to the ICU requiring pressors, intubated for acute hypoxic respiratory failure.  IR consulted and he is status post percutaneous cholecystostomy. -Has been placed on ceftriaxone and Flagyl started on 5/9, plan for total of 7-10 days, today is day #5 -Afebrile, cultures negative -Critical care discussed with general surgery, he will need elective cholecystectomy once better in a few weeks, with Dr. Arnoldo Morale in [place]  Active Problems Acute on chronic hypoxic respiratory failure-he is on 1.5-2 L at home following his Covid episode 2021.  Currently on 4 L, wean off oxygen as tolerated.  Elevated Trop and BNP, coronary artery calcifications on CT -High-sensitivity troponin on arrival was 157, EKG has some nonspecific T wave inversions, suggesting demand ischemia.  A 2D echo was done on 5/11 which showed EF 55-60%, no WMA  and grade 1 diastolic dysfunction. Cardiology consulted and evaluated patient while hospitalized, recommending further ischemic work-up as an outpatient.  Mild Urinary Retention 2/2 Hypospadias -Urology was consulted and placed foley cath with recommendations to remove when he is more ambulatory.  For now continue Foley cath, awaiting PT eval  HTN -continue to hold home meds ona admission due to shock  Lung NodulesRLL -Seen on 5/4 CT scan,6 mm and 7 mm noncalcified right basilar lung nodules -Follow up CT scan in 3-6 months -Pulmonary follow up. Seen by Dr. Melvyn Novas previously  History of DVT -Patient developed DVT during COVID-19 infection  He was on Eliquis that was discontinued few weeks prior to admission -On subcutaneous Lovenox on SCDs for VT prophylaxis.  ID DM2 CBG (last 3)  Recent Labs    04/12/21 2016 04/12/21 2332 04/13/21 0916  GLUCAP 158* 168* 233*   Scheduled Meds: . aspirin  81 mg Oral Daily  . Chlorhexidine Gluconate Cloth  6 each Topical Daily  . enoxaparin (LOVENOX) injection  40 mg Subcutaneous Q24H  . famotidine  20 mg Oral Daily  . feeding supplement  237 mL Oral TID BM  . insulin aspart  0-15 Units Subcutaneous Q4H  . mouth rinse  15 mL Mouth Rinse BID  . rosuvastatin  5 mg Oral Daily  . sodium chloride flush  5 mL Intracatheter Q8H   Continuous Infusions: . sodium chloride Stopped (04/12/21 2344)  . cefTRIAXone (ROCEPHIN)  IV Stopped (04/12/21 1800)  . metronidazole 500 mg (04/13/21 0821)   PRN Meds:.sodium chloride, acetaminophen **OR** acetaminophen, docusate sodium, ondansetron **OR** ondansetron (ZOFRAN) IV, polyethylene glycol  Diet Orders (From admission, onward)    Start     Ordered  04/13/21 0859  Diet regular Room service appropriate? Yes; Fluid consistency: Thin  Diet effective now       Question Answer Comment  Room service appropriate? Yes   Fluid consistency: Thin      04/13/21 0858          DVT prophylaxis: enoxaparin  (LOVENOX) injection 40 mg Start: 04/11/21 1430 SCDs Start: 04/09/21 2236     Code Status: Full Code  Family Communication: Wife present at bedside  Status is: Inpatient  Remains inpatient appropriate because:Inpatient level of care appropriate due to severity of illness   Dispo: The patient is from: Home              Anticipated d/c is to: Home              Patient currently is not medically stable to d/c.   Difficult to place patient No  Level of care: Telemetry Medical  Consultants:  Critical care Interventional radiology Cardiology  Procedures:  2D echo Foley  Microbiology  none  Antimicrobials: Ceftriaxone, metronidazole 5/9 >>   Objective: Vitals:   04/12/21 2300 04/13/21 0000 04/13/21 0104 04/13/21 0625  BP: (!) 148/87 124/69 (!) 144/76 (!) 151/96  Pulse: 91 95 94 95  Resp: (!) 27 14 (!) 28 (!) 32  Temp:  98.9 F (37.2 C)  98.2 F (36.8 C)  TempSrc:  Oral  Oral  SpO2: 93% 93% 96% 94%  Weight:      Height:        Intake/Output Summary (Last 24 hours) at 04/13/2021 1005 Last data filed at 04/13/2021 0846 Gross per 24 hour  Intake 1495.94 ml  Output 1525 ml  Net -29.06 ml   Filed Weights   04/10/21 0500 04/11/21 0500 04/12/21 0430  Weight: 91.2 kg 91.2 kg 91.8 kg    Examination:  Constitutional: NAD Eyes: no scleral icterus ENMT: Mucous membranes are moist.  Neck: normal, supple Respiratory: clear to auscultation bilaterally, no wheezing, no crackles. Normal respiratory effort.  Cardiovascular: Regular rate and rhythm, no murmurs / rubs / gallops. No LE edema.  Abdomen: non distended, no tenderness. Bowel sounds positive.  Drain in place Musculoskeletal: no clubbing / cyanosis.  Skin: no rashes Neurologic: Nonfocal  Data Reviewed: I have independently reviewed following labs and imaging studies   CBC: Recent Labs  Lab 04/09/21 2235 04/09/21 2322 04/10/21 0455 04/11/21 0910 04/12/21 0020 04/13/21 0438  WBC 23.7*  --  15.8* 9.3 9.0  9.0  NEUTROABS 18.9*  --   --  6.8  --   --   HGB 13.1 13.3 12.7* 12.3* 12.3* 12.4*  HCT 42.0 39.0 39.4 38.3* 38.8* 40.9  MCV 88.8  --  86.2 87.8 88.4 90.5  PLT 334  --  296 216 237 536   Basic Metabolic Panel: Recent Labs  Lab 04/09/21 1608 04/09/21 2235 04/09/21 2322 04/10/21 0455 04/11/21 0910 04/12/21 0020 04/13/21 0438  NA  --  131* 131* 133* 138 141 142  K  --  4.6 4.1 3.6 4.1 3.8 4.1  CL  --  96*  --  100 106 105 104  CO2  --  23  --  26 27 27  32  GLUCOSE  --  254*  --  182* 225* 78 163*  BUN  --  29*  --  26* 25* 19 12  CREATININE 0.82 0.94  --  0.80 0.77 0.70 0.70  CALCIUM  --  8.5*  --  8.5* 8.2* 8.3* 8.6*  MG 1.9  --   --  2.3  --   --   --    Liver Function Tests: Recent Labs  Lab 04/09/21 1153 04/10/21 0455 04/11/21 0910 04/12/21 0020 04/13/21 0438  AST 22 24 25 25 29   ALT 19 27 28 28  34  ALKPHOS 73 67 54 55 53  BILITOT 1.0 0.6 <0.1* 0.3 0.2*  PROT 7.0 5.4* 4.8* 5.0* 5.0*  ALBUMIN 3.1* 2.0* 1.8* 2.0* 2.0*   Coagulation Profile: Recent Labs  Lab 04/10/21 0455  INR 1.4*   HbA1C: No results for input(s): HGBA1C in the last 72 hours. CBG: Recent Labs  Lab 04/12/21 1108 04/12/21 1504 04/12/21 2016 04/12/21 2332 04/13/21 0916  GLUCAP 122* 201* 158* 168* 233*    Recent Results (from the past 240 hour(s))  Resp Panel by RT-PCR (Flu A&B, Covid) Nasopharyngeal Swab     Status: None   Collection Time: 04/09/21 12:17 PM   Specimen: Nasopharyngeal Swab; Nasopharyngeal(NP) swabs in vial transport medium  Result Value Ref Range Status   SARS Coronavirus 2 by RT PCR NEGATIVE NEGATIVE Final    Comment: (NOTE) SARS-CoV-2 target nucleic acids are NOT DETECTED.  The SARS-CoV-2 RNA is generally detectable in upper respiratory specimens during the acute phase of infection. The lowest concentration of SARS-CoV-2 viral copies this assay can detect is 138 copies/mL. A negative result does not preclude SARS-Cov-2 infection and should not be used as the  sole basis for treatment or other patient management decisions. A negative result may occur with  improper specimen collection/handling, submission of specimen other than nasopharyngeal swab, presence of viral mutation(s) within the areas targeted by this assay, and inadequate number of viral copies(<138 copies/mL). A negative result must be combined with clinical observations, patient history, and epidemiological information. The expected result is Negative.  Fact Sheet for Patients:  EntrepreneurPulse.com.au  Fact Sheet for Healthcare Providers:  IncredibleEmployment.be  This test is no t yet approved or cleared by the [place] FDA and  has been authorized for detection and/or diagnosis of SARS-CoV-2 by FDA under an Emergency Use Authorization (EUA). This EUA will remain  in effect (meaning this test can be used) for the duration of the COVID-19 declaration under Section 564(b)(1) of the Act, 21 U.S.C.section 360bbb-3(b)(1), unless the authorization is terminated  or revoked sooner.       Influenza A by PCR NEGATIVE NEGATIVE Final   Influenza B by PCR NEGATIVE NEGATIVE Final    Comment: (NOTE) The Xpert Xpress SARS-CoV-2/FLU/RSV plus assay is intended as an aid in the diagnosis of influenza from Nasopharyngeal swab specimens and should not be used as a sole basis for treatment. Nasal washings and aspirates are unacceptable for Xpert Xpress SARS-CoV-2/FLU/RSV testing.  Fact Sheet for Patients: EntrepreneurPulse.com.au  Fact Sheet for Healthcare Providers: IncredibleEmployment.be  This test is not yet approved or cleared by the [place] FDA and has been authorized for detection and/or diagnosis of SARS-CoV-2 by FDA under an Emergency Use Authorization (EUA). This EUA will remain in effect (meaning this test can be used) for the duration of the COVID-19 declaration under Section 564(b)(1) of the  Act, 21 U.S.C. section 360bbb-3(b)(1), unless the authorization is terminated or revoked.  Performed at [facility], [address] [place], [place] [address]   Blood culture (routine x 2)     Status: None (Preliminary result)   Collection Time: 04/09/21  3:12 PM   Specimen: BLOOD  Result Value Ref Range Status   Specimen Description BLOOD LEFT ANTECUBITAL  Final   Special Requests  Final    BOTTLES DRAWN AEROBIC AND ANAEROBIC Blood Culture adequate volume   Culture   Final    NO GROWTH 4 DAYS Performed at [facility], [address], [place], [place] [address]    Report Status PENDING  Incomplete  Blood culture (routine x 2)     Status: None (Preliminary result)   Collection Time: 04/09/21  3:13 PM   Specimen: BLOOD LEFT HAND  Result Value Ref Range Status   Specimen Description BLOOD LEFT HAND  Final   Special Requests   Final    BOTTLES DRAWN AEROBIC AND ANAEROBIC Blood Culture adequate volume   Culture   Final    NO GROWTH 4 DAYS Performed at [facility], [address], [place], [place] [address]    Report Status PENDING  Incomplete  MRSA PCR Screening     Status: None   Collection Time: 04/09/21  7:21 PM  Result Value Ref Range Status   MRSA by PCR NEGATIVE NEGATIVE Final    Comment:        The GeneXpert MRSA Assay (FDA approved for NASAL specimens only), is one component of a comprehensive MRSA colonization surveillance program. It is not intended to diagnose MRSA infection nor to guide or monitor treatment for MRSA infections. Performed at [facility], [address] [place], [place] [address]   MRSA PCR Screening     Status: None   Collection Time: 04/09/21 10:20 PM   Specimen: Nasopharyngeal  Result Value Ref Range Status   MRSA by PCR NEGATIVE NEGATIVE Final    Comment:        The GeneXpert MRSA Assay (FDA approved for NASAL specimens only), is one component of a comprehensive MRSA colonization surveillance program. It is not intended to diagnose  MRSA infection nor to guide or monitor treatment for MRSA infections. Performed at [facility], [place] [address] [place], [place] [address]   Aerobic/Anaerobic Culture (surgical/deep wound)     Status: None (Preliminary result)   Collection Time: 04/10/21  1:23 AM   Specimen: Gallbladder; Abscess  Result Value Ref Range Status   Specimen Description GALL BLADDER FLUID  Final   Special Requests SYRINGE  Final   Gram Stain   Final    WBC PRESENT, PREDOMINANTLY MONONUCLEAR NO ORGANISMS SEEN    Culture   Final    NO GROWTH 2 DAYS NO ANAEROBES ISOLATED; CULTURE IN PROGRESS FOR 5 DAYS Performed at [facility], [place] [address], [place], [place] [address]    Report Status PENDING  Incomplete  Culture, respiratory (tracheal aspirate)     Status: None   Collection Time: 04/10/21  2:47 AM   Specimen: Tracheal Aspirate; Respiratory  Result Value Ref Range Status   Specimen Description TRACHEAL ASPIRATE  Final   Special Requests NONE  Final   Gram Stain   Final    MODERATE WBC PRESENT, PREDOMINANTLY PMN MODERATE GRAM POSITIVE COCCI    Culture   Final    ABUNDANT Consistent with normal respiratory flora. No Pseudomonas species isolated Performed at [place] [address], [place],  [address]    Report Status 04/12/2021 FINAL  Final     Radiology Studies: No results found.  Marzetta Board, MD, PhD [facility]  Between 7 am - 7 pm I am available, please contact me via Amion (for emergencies) or Securechat (non urgent messages)  Between 7 pm - 7 am I am not available, please contact night coverage MD/APP via  Amion

## 2021-04-14 LAB — COMPREHENSIVE METABOLIC PANEL
ALT: 28 U/L (ref 0–44)
AST: 18 U/L (ref 15–41)
Albumin: 2.2 g/dL — ABNORMAL LOW (ref 3.5–5.0)
Alkaline Phosphatase: 49 U/L (ref 38–126)
Anion gap: 8 (ref 5–15)
BUN: 11 mg/dL (ref 8–23)
CO2: 30 mmol/L (ref 22–32)
Calcium: 8.3 mg/dL — ABNORMAL LOW (ref 8.9–10.3)
Chloride: 100 mmol/L (ref 98–111)
Creatinine, Ser: 0.63 mg/dL (ref 0.61–1.24)
GFR, Estimated: >60 mL/min (ref >60)
Glucose, Bld: 192 mg/dL — ABNORMAL HIGH (ref 70–99)
Potassium: 3.8 mmol/L (ref 3.5–5.1)
Sodium: 138 mmol/L (ref 135–145)
Total Bilirubin: 0.4 mg/dL (ref 0.3–1.2)
Total Protein: 5.1 g/dL — ABNORMAL LOW (ref 6.5–8.1)

## 2021-04-14 LAB — CULTURE, BLOOD (ROUTINE X 2)
Culture: NO GROWTH
Culture: NO GROWTH
Special Requests: ADEQUATE
Special Requests: ADEQUATE

## 2021-04-14 LAB — GLUCOSE, CAPILLARY
Glucose-Capillary: 157 mg/dL — ABNORMAL HIGH (ref 70–99)
Glucose-Capillary: 163 mg/dL — ABNORMAL HIGH (ref 70–99)
Glucose-Capillary: 163 mg/dL — ABNORMAL HIGH (ref 70–99)
Glucose-Capillary: 186 mg/dL — ABNORMAL HIGH (ref 70–99)
Glucose-Capillary: 242 mg/dL — ABNORMAL HIGH (ref 70–99)
Glucose-Capillary: 267 mg/dL — ABNORMAL HIGH (ref 70–99)

## 2021-04-14 LAB — CBC
HCT: 40.5 % (ref 39.0–52.0)
Hemoglobin: 12.6 g/dL — ABNORMAL LOW (ref 13.0–17.0)
MCH: 27.6 pg (ref 26.0–34.0)
MCHC: 31.1 g/dL (ref 30.0–36.0)
MCV: 88.8 fL (ref 80.0–100.0)
Platelets: 220 10*3/uL (ref 150–400)
RBC: 4.56 MIL/uL (ref 4.22–5.81)
RDW: 14.5 % (ref 11.5–15.5)
WBC: 8.6 10*3/uL (ref 4.0–10.5)
nRBC: 0 % (ref 0.0–0.2)

## 2021-04-14 MED ORDER — OXYCODONE HCL 5 MG PO TABS
5.0000 mg | ORAL_TABLET | ORAL | Status: DC | PRN
  Administered 2021-04-14: 5 mg via ORAL
  Filled 2021-04-14: qty 1

## 2021-04-14 MED ORDER — TRAZODONE HCL 50 MG PO TABS
25.0000 mg | ORAL_TABLET | Freq: Every evening | ORAL | Status: DC | PRN
  Administered 2021-04-14: 25 mg via ORAL
  Filled 2021-04-14: qty 1

## 2021-04-14 NOTE — Plan of Care (Signed)
  Problem: Health Behavior/Discharge Planning: Goal: Ability to manage health-related needs will improve Outcome: Progressing   Problem: Clinical Measurements: Goal: Respiratory complications will improve Outcome: Progressing   Problem: Coping: Goal: Level of anxiety will decrease Outcome: Progressing

## 2021-04-14 NOTE — Progress Notes (Signed)
PROGRESS NOTE  Tim Day HCW:[id number] DOB: 1951/01/27 DOA: 04/09/2021 PCP: Celene Squibb, MD   LOS: 5 days   Brief Narrative / Interim history: 70 year old male with history of COVID in November 1761 complicated by chronic hypoxic respiratory failure on 1.5-2 L at home, DM2, hypertension, prior DVT status post completed course of anticoagulation who is in the hospital with acute cholecystitis.  Patient initially presented to [facility] ED with abdominal pain, diagnosed with acute cholecystitis and general surgery was consulted.  Patient had respiratory decompensation shortly after admission requiring intubation and transferred to [facility] where he was in the ICU and he is status post IR placed percutaneous cholecystostomy.  He was successfully extubated and transferred to the hospitalist service on 5/13.  Subjective / 24h Interval events: He is doing much better this morning, sitting in the chair.  He was able to get up and walk yesterday with physical therapy.  Also complains of persistent abdominal soreness  Assessment & Plan: Principal Problem Septic shock due to acute gallstone cholecystitis -initially transferred to the ICU requiring pressors, intubated for acute hypoxic respiratory failure.  IR consulted and he is status post percutaneous cholecystostomy. -Has been placed on ceftriaxone and Flagyl started on 5/9, plan for total of 7-10 days, today is day #6 -Afebrile, cultures negative -Critical care discussed with general surgery, he will need elective cholecystectomy once better in a few weeks, with Dr. Arnoldo Morale in [place] to be improving well today, he continues to do well and has a good day today may be able to be discharged home tomorrow  Active Problems Acute on chronic hypoxic respiratory failure-he is on 1.5-2 L at home following his Covid episode 2021.  Currently on 2.5 L, close to baseline  Elevated Trop and BNP, coronary artery calcifications on  CT -High-sensitivity troponin on arrival was 157, EKG has some nonspecific T wave inversions, suggesting demand ischemia.  A 2D echo was done on 5/11 which showed EF 55-60%, no WMA and grade 1 diastolic dysfunction. Cardiology consulted and evaluated patient while hospitalized, recommending further ischemic work-up as an outpatient.  Mild Urinary Retention 2/2 Hypospadias -Urology was consulted and placed foley cath with recommendations to remove when he is more ambulatory.  -Remove Foley catheter today  HTN -continue to hold home meds ona admission due to shock  Lung NodulesRLL -Seen on 5/4 CT scan,6 mm and 7 mm noncalcified right basilar lung nodules -Follow up CT scan in 3-6 months -Pulmonary follow up. Seen by Dr. Melvyn Novas previously  History of DVT -Patient developed DVT during COVID-19 infection  He was on Eliquis that was discontinued few weeks prior to admission -On subcutaneous Lovenox on SCDs for VT prophylaxis.  ID DM2 CBG (last 3)  Recent Labs    04/14/21 0047 04/14/21 0442 04/14/21 0724  GLUCAP 163* 163* 157*   Scheduled Meds: . aspirin  81 mg Oral Daily  . Chlorhexidine Gluconate Cloth  6 each Topical Daily  . enoxaparin (LOVENOX) injection  40 mg Subcutaneous Q24H  . famotidine  20 mg Oral Daily  . feeding supplement  237 mL Oral TID BM  . insulin aspart  0-15 Units Subcutaneous Q4H  . mouth rinse  15 mL Mouth Rinse BID  . rosuvastatin  5 mg Oral Daily  . sodium chloride flush  5 mL Intracatheter Q8H   Continuous Infusions: . sodium chloride Stopped (04/13/21 1449)  . cefTRIAXone (ROCEPHIN)  IV Stopped (04/13/21 1820)  . metronidazole 100 mL/hr at 04/14/21 [phone]  PRN Meds:.sodium chloride, acetaminophen **OR** acetaminophen, docusate sodium, ondansetron **OR** ondansetron (ZOFRAN) IV, oxyCODONE, polyethylene glycol, traZODone  Diet Orders (From admission, onward)    Start     Ordered   04/13/21 0859  Diet regular Room service appropriate? Yes; Fluid  consistency: Thin  Diet effective now       Question Answer Comment  Room service appropriate? Yes   Fluid consistency: Thin      04/13/21 0858          DVT prophylaxis: enoxaparin (LOVENOX) injection 40 mg Start: 04/11/21 1430 SCDs Start: 04/09/21 2236     Code Status: Full Code  Family Communication: Wife present at bedside  Status is: Inpatient  Remains inpatient appropriate because:Inpatient level of care appropriate due to severity of illness   Dispo: The patient is from: Home              Anticipated d/c is to: Home              Patient currently is not medically stable to d/c.   Difficult to place patient No  Level of care: Telemetry Medical  Consultants:  Critical care Interventional radiology Cardiology  Procedures:  2D echo Foley  Microbiology  none  Antimicrobials: Ceftriaxone, metronidazole 5/9 >>   Objective: Vitals:   04/13/21 1833 04/13/21 2006 04/14/21 0449 04/14/21 1004  BP: (!) 143/88 133/83 (!) 161/90 (!) 143/87  Pulse: 90 84 89 99  Resp: 19 18 18 18   Temp: 98.8 F (37.1 C) 99.4 F (37.4 C) 98.5 F (36.9 C) 97.9 F (36.6 C)  TempSrc: Oral Oral Oral Oral  SpO2: 95% 97% 96% 95%  Weight:      Height:        Intake/Output Summary (Last 24 hours) at 04/14/2021 1132 Last data filed at 04/14/2021 1015 Gross per 24 hour  Intake 966.11 ml  Output 1330 ml  Net -363.89 ml   Filed Weights   04/10/21 0500 04/11/21 0500 04/12/21 0430  Weight: 91.2 kg 91.2 kg 91.8 kg    Examination:  Constitutional: No distress Eyes: No icterus ENMT: mmm Neck: normal, supple Respiratory: Clear bilaterally, no wheezing or crackles, normal respiratory effort Cardiovascular: Regular rate and rhythm, no murmurs, no edema Abdomen: Soft, NT, ND, bowel sounds positive, cholecystostomy drain in place Musculoskeletal: no clubbing / cyanosis.  Skin: No rashes Neurologic: No focal deficits  Data Reviewed: I have independently reviewed following labs and  imaging studies   CBC: Recent Labs  Lab 04/09/21 2235 04/09/21 2322 04/10/21 0455 04/11/21 0910 04/12/21 0020 04/13/21 0438 04/14/21 0359  WBC 23.7*  --  15.8* 9.3 9.0 9.0 8.6  NEUTROABS 18.9*  --   --  6.8  --   --   --   HGB 13.1   < > 12.7* 12.3* 12.3* 12.4* 12.6*  HCT 42.0   < > 39.4 38.3* 38.8* 40.9 40.5  MCV 88.8  --  86.2 87.8 88.4 90.5 88.8  PLT 334  --  296 216 237 251 220   < > = values in this interval not displayed.   Basic Metabolic Panel: Recent Labs  Lab 04/09/21 1608 04/09/21 2235 04/10/21 0455 04/11/21 0910 04/12/21 0020 04/13/21 0438 04/14/21 0359  NA  --    < > 133* 138 141 142 138  K  --    < > 3.6 4.1 3.8 4.1 3.8  CL  --    < > 100 106 105 104 100  CO2  --    < >  26 27 27  32 30  GLUCOSE  --    < > 182* 225* 78 163* 192*  BUN  --    < > 26* 25* 19 12 11   CREATININE 0.82   < > 0.80 0.77 0.70 0.70 0.63  CALCIUM  --    < > 8.5* 8.2* 8.3* 8.6* 8.3*  MG 1.9  --  2.3  --   --   --   --    < > = values in this interval not displayed.   Liver Function Tests: Recent Labs  Lab 04/10/21 0455 04/11/21 0910 04/12/21 0020 04/13/21 0438 04/14/21 0359  AST 24 25 25 29 18   ALT 27 28 28  34 28  ALKPHOS 67 54 55 53 49  BILITOT 0.6 <0.1* 0.3 0.2* 0.4  PROT 5.4* 4.8* 5.0* 5.0* 5.1*  ALBUMIN 2.0* 1.8* 2.0* 2.0* 2.2*   Coagulation Profile: Recent Labs  Lab 04/10/21 0455  INR 1.4*   HbA1C: No results for input(s): HGBA1C in the last 72 hours. CBG: Recent Labs  Lab 04/13/21 1556 04/13/21 2008 04/14/21 0047 04/14/21 0442 04/14/21 0724  GLUCAP 180* 132* 163* 163* 157*    Recent Results (from the past 240 hour(s))  Resp Panel by RT-PCR (Flu A&B, Covid) Nasopharyngeal Swab     Status: None   Collection Time: 04/09/21 12:17 PM   Specimen: Nasopharyngeal Swab; Nasopharyngeal(NP) swabs in vial transport medium  Result Value Ref Range Status   SARS Coronavirus 2 by RT PCR NEGATIVE NEGATIVE Final    Comment: (NOTE) SARS-CoV-2 target nucleic acids are  NOT DETECTED.  The SARS-CoV-2 RNA is generally detectable in upper respiratory specimens during the acute phase of infection. The lowest concentration of SARS-CoV-2 viral copies this assay can detect is 138 copies/mL. A negative result does not preclude SARS-Cov-2 infection and should not be used as the sole basis for treatment or other patient management decisions. A negative result may occur with  improper specimen collection/handling, submission of specimen other than nasopharyngeal swab, presence of viral mutation(s) within the areas targeted by this assay, and inadequate number of viral copies(<138 copies/mL). A negative result must be combined with clinical observations, patient history, and epidemiological information. The expected result is Negative.  Fact Sheet for Patients:  EntrepreneurPulse.com.au  Fact Sheet for Healthcare Providers:  IncredibleEmployment.be  This test is no t yet approved or cleared by the [place] FDA and  has been authorized for detection and/or diagnosis of SARS-CoV-2 by FDA under an Emergency Use Authorization (EUA). This EUA will remain  in effect (meaning this test can be used) for the duration of the COVID-19 declaration under Section 564(b)(1) of the Act, 21 U.S.C.section 360bbb-3(b)(1), unless the authorization is terminated  or revoked sooner.       Influenza A by PCR NEGATIVE NEGATIVE Final   Influenza B by PCR NEGATIVE NEGATIVE Final    Comment: (NOTE) The Xpert Xpress SARS-CoV-2/FLU/RSV plus assay is intended as an aid in the diagnosis of influenza from Nasopharyngeal swab specimens and should not be used as a sole basis for treatment. Nasal washings and aspirates are unacceptable for Xpert Xpress SARS-CoV-2/FLU/RSV testing.  Fact Sheet for Patients: EntrepreneurPulse.com.au  Fact Sheet for Healthcare Providers: IncredibleEmployment.be  This test is not  yet approved or cleared by the [place] FDA and has been authorized for detection and/or diagnosis of SARS-CoV-2 by FDA under an Emergency Use Authorization (EUA). This EUA will remain in effect (meaning this test can be used) for the duration of the  COVID-19 declaration under Section 564(b)(1) of the Act, 21 U.S.C. section 360bbb-3(b)(1), unless the authorization is terminated or revoked.  Performed at [facility], [address] [place], [place] [address]   Blood culture (routine x 2)     Status: None   Collection Time: 04/09/21  3:12 PM   Specimen: BLOOD  Result Value Ref Range Status   Specimen Description BLOOD LEFT ANTECUBITAL  Final   Special Requests   Final    BOTTLES DRAWN AEROBIC AND ANAEROBIC Blood Culture adequate volume   Culture   Final    NO GROWTH 5 DAYS Performed at [facility], [address], [place], [place] [address]    Report Status 04/14/2021 FINAL  Final  Blood culture (routine x 2)     Status: None   Collection Time: 04/09/21  3:13 PM   Specimen: BLOOD LEFT HAND  Result Value Ref Range Status   Specimen Description BLOOD LEFT HAND  Final   Special Requests   Final    BOTTLES DRAWN AEROBIC AND ANAEROBIC Blood Culture adequate volume   Culture   Final    NO GROWTH 5 DAYS Performed at [facility], [address] [place], [place] [address]    Report Status 04/14/2021 FINAL  Final  MRSA PCR Screening     Status: None   Collection Time: 04/09/21  7:21 PM  Result Value Ref Range Status   MRSA by PCR NEGATIVE NEGATIVE Final    Comment:        The GeneXpert MRSA Assay (FDA approved for NASAL specimens only), is one component of a comprehensive MRSA colonization surveillance program. It is not intended to diagnose MRSA infection nor to guide or monitor treatment for MRSA infections. Performed at [facility], [address] [place], [place] [address]   MRSA PCR Screening     Status: None   Collection Time: 04/09/21 10:20 PM   Specimen:  Nasopharyngeal  Result Value Ref Range Status   MRSA by PCR NEGATIVE NEGATIVE Final    Comment:        The GeneXpert MRSA Assay (FDA approved for NASAL specimens only), is one component of a comprehensive MRSA colonization surveillance program. It is not intended to diagnose MRSA infection nor to guide or monitor treatment for MRSA infections. Performed at [facility], [place] [address], [place],  [address]   Aerobic/Anaerobic Culture (surgical/deep wound)     Status: None (Preliminary result)   Collection Time: 04/10/21  1:23 AM   Specimen: Gallbladder; Abscess  Result Value Ref Range Status   Specimen Description GALL BLADDER FLUID  Final   Special Requests SYRINGE  Final   Gram Stain   Final    WBC PRESENT, PREDOMINANTLY MONONUCLEAR NO ORGANISMS SEEN    Culture   Final    NO GROWTH 3 DAYS NO ANAEROBES ISOLATED; CULTURE IN PROGRESS FOR 5 DAYS Performed at [facility], [place] [address] [place],  [address]    Report Status PENDING  Incomplete  Culture, respiratory (tracheal aspirate)     Status: None   Collection Time: 04/10/21  2:47 AM   Specimen: Tracheal Aspirate; Respiratory  Result Value Ref Range Status   Specimen Description TRACHEAL ASPIRATE  Final   Special Requests NONE  Final   Gram Stain   Final    MODERATE WBC PRESENT, PREDOMINANTLY PMN MODERATE GRAM POSITIVE COCCI    Culture   Final    ABUNDANT Consistent with normal respiratory flora. No Pseudomonas species isolated  Performed at [facility], [place] [address], [place], [place] [address]    Report Status 04/12/2021 FINAL  Final     Radiology Studies: No results found.  Marzetta Board, MD, PhD [facility]  Between 7 am - 7 pm I am available, please contact me via Amion (for emergencies) or Securechat (non urgent messages)  Between 7 pm - 7 am I am not available, please contact night coverage MD/APP via Amion

## 2021-04-14 NOTE — Plan of Care (Signed)
  Problem: Clinical Measurements: Goal: Ability to maintain clinical measurements within normal limits will improve Outcome: Progressing   Problem: Nutrition: Goal: Adequate nutrition will be maintained Outcome: Progressing   Problem: Coping: Goal: Level of anxiety will decrease Outcome: Progressing   Problem: Safety: Goal: Ability to remain free from injury will improve Outcome: Progressing   Problem: Skin Integrity: Goal: Risk for impaired skin integrity will decrease Outcome: Progressing   

## 2021-04-15 LAB — CBC
HCT: 41.4 % (ref 39.0–52.0)
Hemoglobin: 13.1 g/dL (ref 13.0–17.0)
MCH: 28.1 pg (ref 26.0–34.0)
MCHC: 31.6 g/dL (ref 30.0–36.0)
MCV: 88.8 fL (ref 80.0–100.0)
Platelets: 260 10*3/uL (ref 150–400)
RBC: 4.66 MIL/uL (ref 4.22–5.81)
RDW: 14.4 % (ref 11.5–15.5)
WBC: 9.5 10*3/uL (ref 4.0–10.5)
nRBC: 0 % (ref 0.0–0.2)

## 2021-04-15 LAB — AEROBIC/ANAEROBIC CULTURE W GRAM STAIN (SURGICAL/DEEP WOUND): Culture: NO GROWTH

## 2021-04-15 LAB — COMPREHENSIVE METABOLIC PANEL
ALT: 24 U/L (ref 0–44)
AST: 18 U/L (ref 15–41)
Albumin: 2.3 g/dL — ABNORMAL LOW (ref 3.5–5.0)
Alkaline Phosphatase: 53 U/L (ref 38–126)
Anion gap: 4 — ABNORMAL LOW (ref 5–15)
BUN: 12 mg/dL (ref 8–23)
CO2: 34 mmol/L — ABNORMAL HIGH (ref 22–32)
Calcium: 8.4 mg/dL — ABNORMAL LOW (ref 8.9–10.3)
Chloride: 100 mmol/L (ref 98–111)
Creatinine, Ser: 0.69 mg/dL (ref 0.61–1.24)
GFR, Estimated: >60 mL/min (ref >60)
Glucose, Bld: 182 mg/dL — ABNORMAL HIGH (ref 70–99)
Potassium: 4.2 mmol/L (ref 3.5–5.1)
Sodium: 138 mmol/L (ref 135–145)
Total Bilirubin: 0.3 mg/dL (ref 0.3–1.2)
Total Protein: 5.4 g/dL — ABNORMAL LOW (ref 6.5–8.1)

## 2021-04-15 LAB — GLUCOSE, CAPILLARY
Glucose-Capillary: 160 mg/dL — ABNORMAL HIGH (ref 70–99)
Glucose-Capillary: 174 mg/dL — ABNORMAL HIGH (ref 70–99)
Glucose-Capillary: 199 mg/dL — ABNORMAL HIGH (ref 70–99)

## 2021-04-15 MED ORDER — OXYCODONE HCL 5 MG PO TABS: 5.0000 mg | ORAL_TABLET | ORAL | 0 refills | Status: AC | PRN

## 2021-04-15 MED ORDER — CIPROFLOXACIN HCL 500 MG PO TABS: 500.0000 mg | ORAL_TABLET | Freq: Two times a day (BID) | ORAL | 0 refills | Status: AC

## 2021-04-15 MED ORDER — METRONIDAZOLE 500 MG PO TABS: 500.0000 mg | ORAL_TABLET | Freq: Three times a day (TID) | ORAL | 0 refills | Status: AC

## 2021-04-15 NOTE — Discharge Summary (Signed)
Physician Discharge Summary  Berdell Hostetler AQT:[id number] DOB: 1951-05-05 DOA: 04/09/2021  PCP: Celene Squibb, MD  Admit date: 04/09/2021 Discharge date: 04/15/2021  Admitted From: home Disposition:  home  Recommendations for Outpatient Follow-up:  1. Follow up with PCP in 1-2 weeks 2. Follow-up with interventional radiology in 1 to 2 weeks 3. Follow-up with Dr. Arnoldo Morale with general surgery in 2 to 3 weeks  Home Health: home Equipment/Devices: chronic home O2  Discharge Condition: stable CODE STATUS: Full code Diet recommendation: diabetic   HPI: Per admitting MD, Verlyn Lambert is a 70 y.o. male with medical history significant of hypertension, type 2 diabetes mellitus, GERD, COVID in August 2021 resulting in DVT who was on Eliquis up until 3 weeks ago as well as chronic hypoxic respiratory failure using 1.5 to 2 L of nasal oxygen 24/7,, hyperlipidemia and multiple other comorbidities as below presented to ED with a complaint of persistent right upper quadrant pain.  Wife at the bedside.  According to the wife and the patient, he initially started having right upper quadrant pain about 5 days ago on Wednesday.  Patient was brought to the ED at [facility].  All the work-up at that time including CT abdomen and pelvis with contrast as well as CT angiogram of the chest were negative so he was sent home.  He then presented to ED at [facility] 2 days later on 04/06/2021 with the similar complaints.  Once again he was sent home since there was no source found for his abdominal pain.  He is returning to the ED today once again with persistent right upper quadrant pain.  According to the patient and his wife, the pain has been constant, crampy as well as sharp, radiating to the back, mainly located in the right upper quadrant with no aggravating or relieving factor and typically remains severe up to 10 out of 10.  He endorses having some shortness of breath however he denies any  fever, chills, sweating, chest pain, nausea, vomiting, any problem with urination or with bowel movements.  No sick contact or any recent travel.  Hospital Course / Discharge diagnoses: Principal Problem Septic shock due to acute gallstone cholecystitis -initially transferred to the ICU requiring pressors, intubated for acute hypoxic respiratory failure.  IR consulted and he is status post percutaneous cholecystostomy.  After antibiotics and percutaneous cholecystostomy he improved, he was weaned off pressors and eventually extubated.  He was transferred back to the hospitalist service on 5/13.  He improved significantly, able to tolerate a regular diet, ambulatory and will be discharged home in stable condition.  He was given 7 days of antibiotics in the hospital and will be converted to oral antibiotics for 3 additional days on discharge for total of 10 days.  He has follow-up with interventional radiology as an outpatient for his cholecystostomy tube and was recommended to see Dr. Arnoldo Morale with general surgery in 3 weeks for elective cholecystectomy in the future  Active Problems Acute on chronic hypoxic respiratory failure-he is on 1.5-2 L at home following his Covid episode 2021.   Continue oxygen, currently at baseline Elevated Trop and BNP, coronary artery calcifications on CT -High-sensitivity troponin on arrival was 157, EKG has some nonspecific T wave inversions, suggesting demand ischemia.  A 2D echo was done on 5/11 which showed EF 55-60%, no WMA and grade 1 diastolic dysfunction. Cardiology consulted and evaluated patient while hospitalized, recommending further ischemic work-up as an outpatient. Mild Urinary Retention 2/2 Hypospadias -Urology  was consulted and placed foley cath with recommendations to remove when he is more ambulatory.   His Foley was removed the day prior to discharge, he is able to urinate well without difficulties, follow-up with urology PRN as an outpatient HTN -blood  pressure improved/now on the high side, resume home medications Lung NodulesRLL -Seen on 5/4 CT scan,6 mm and 7 mm noncalcified right basilar lung nodules. Follow up CT scan in 3-6 months. Pulmonary follow up. Seen by Dr. Melvyn Novas previously History of DVT -Patient developed DVT during COVID-19 infection He was on Eliquis that was discontinued few weeks prior to admission ID [id number] -continue home medications   Discharge Instructions   Allergies as of 04/15/2021   No Known Allergies     Medication List    TAKE these medications   Accu-Chek Guide test strip Generic drug: glucose blood USE TO TEST TWICE DAILY.D   albuterol 108 (90 Base) MCG/ACT inhaler Commonly known as: VENTOLIN HFA Inhale 2 puffs into the lungs every 6 (six) hours as needed for wheezing or shortness of breath.   alclomethasone 0.05 % cream Commonly known as: ACLOVATE Apply topically 2 (two) times daily as needed (Rash).   ALPRAZolam 0.25 MG tablet Commonly known as: XANAX Take 0.25 mg by mouth 2 (two) times daily as needed for anxiety.   alum & mag hydroxide-simeth 200-200-20 MG/5ML suspension Commonly known as: MAALOX/MYLANTA Take 30 mLs by mouth every 4 (four) hours as needed for indigestion.   ascorbic acid 500 MG tablet Commonly known as: VITAMIN C Take 1 tablet (500 mg total) by mouth daily.   blood glucose meter kit and supplies Kit Dispense based on patient and insurance preference. Use up to four times daily as directed. (FOR ICD-9 250.00, 250.01).   ciprofloxacin 500 MG tablet Commonly known as: Cipro Take 1 tablet (500 mg total) by mouth 2 (two) times daily for 3 days.   co-enzyme Q-10 50 MG capsule Take 50 mg by mouth 2 (two) times daily.   famotidine 20 MG tablet Commonly known as: PEPCID Take 20 mg by mouth daily.   Fiasp FlexTouch 100 UNIT/ML FlexTouch Pen Generic drug: insulin aspart Inject 1-3 Units into the skin every evening.   furosemide 20 MG tablet Commonly known as:  LASIX Take 20 mg by mouth daily.   ibuprofen 600 MG tablet Commonly known as: ADVIL Take 1 tablet (600 mg total) by mouth every 6 (six) hours as needed.   insulin aspart 100 UNIT/ML injection Commonly known as: novoLOG Inject 0-15 Units into the skin 3 (three) times daily with meals. Sliding scale insulin Less than 70 initiate hypoglycemia protocol 70-120 - 0 units,  120-150-  2 units,  151-200 -3 units,  201-250- 5 units,  251-300- 8 units,  301-350- 11 units,  351-400- 15 units Greater than 400 call MD   Januvia 100 MG tablet Generic drug: sitaGLIPtin Take 100 mg by mouth daily.   ketoconazole 2 % cream Commonly known as: NIZORAL Apply 1 application topically 2 (two) times daily.   metFORMIN 1000 MG tablet Commonly known as: GLUCOPHAGE Take 1,000 mg by mouth 2 (two) times daily with a meal.   metroNIDAZOLE 500 MG tablet Commonly known as: Flagyl Take 1 tablet (500 mg total) by mouth 3 (three) times daily for 3 days.   multivitamin capsule Take 1 capsule by mouth daily.   olmesartan-hydrochlorothiazide 20-12.5 MG tablet Commonly known as: BENICAR HCT Take 1 tablet by mouth daily.   ondansetron 4 MG tablet Commonly known as:  ZOFRAN Take 1 tablet (4 mg total) by mouth every 6 (six) hours as needed for nausea.   oxyCODONE 5 MG immediate release tablet Commonly known as: Oxy IR/ROXICODONE Take 1 tablet (5 mg total) by mouth every 4 (four) hours as needed for up to 5 days for severe pain or breakthrough pain.   pantoprazole 40 MG tablet Commonly known as: PROTONIX Take 40 mg by mouth daily.   Pen Needles 32G X 4 MM Misc 1 Package by Does not apply route 4 (four) times daily -  before meals and at bedtime.   rosuvastatin 5 MG tablet Commonly known as: CRESTOR Take 5 mg by mouth daily.   sildenafil 100 MG tablet Commonly known as: VIAGRA Take 100 mg by mouth at bedtime.   Toujeo Max SoloStar 300 UNIT/ML Solostar Pen Generic drug: insulin glargine (2 Unit  Dial) Inject 30 Units into the skin in the morning.   Vitamin D3 50 MCG (2000 UT) Tabs Take 1 tablet by mouth daily.   zinc sulfate 220 (50 Zn) MG capsule Take 1 capsule (220 mg total) by mouth daily.   zolpidem 10 MG tablet Commonly known as: AMBIEN Take 10 mg by mouth at bedtime as needed.       Follow-up Information    Mir, Paula Libra, MD Follow up in 6 week(s).   Specialties: Interventional Radiology, Diagnostic Radiology, Radiology Why: Please follow-up at IR drain clinic 4-6 weeks after discharge. Our office will call you to set up this appointment. Contact information: [place] [address] [phone]        Aviva Signs, MD. Schedule an appointment as soon as possible for a visit in 3 week(s).   Specialty: General Surgery Contact information: [id number] [address] [phone]               Consultations:  General surgery  IR  Critical care  Procedures/Studies:  DG Chest 2 View  Result Date: 03/25/2021 CLINICAL DATA:  Persistent cough and dyspnea, COVID-19 7 months prior EXAM: CHEST - 2 VIEW COMPARISON:  02/09/2021 chest radiograph. FINDINGS: Low lung volumes, similar. Stable cardiomediastinal silhouette with normal heart size. No pneumothorax. No pleural effusion. Left upper lung 1.1 cm nodule is unchanged from 02/09/2021 chest radiograph, not previously visualized prior to that radiograph. Patchy reticular opacities in both lungs, most prominent in the lower lungs, similar. No superimposed acute consolidative airspace disease. IMPRESSION: Persistent 1.1 cm left upper lung nodule, unchanged from 02/09/2021 chest radiograph, not visualized prior to that chest radiograph, suspicious for a solid pulmonary nodule. Patchy reticular opacities in both lungs, most prominent in the lower lungs, similar. Given the history of prior COVID-19 infection, findings could represent postinflammatory fibrosis. High-resolution  chest CT recommended to evaluate for left upper lobe pulmonary nodule and to characterize the suspected postinflammatory fibrosis. Electronically Signed   By: Ilona Sorrel M.D.   On: 03/25/2021 19:22   CT Angio Chest PE W and/or Wo Contrast  Result Date: 04/04/2021 CLINICAL DATA:  Bilateral rib pain radiating to the back. EXAM: CT ANGIOGRAPHY CHEST WITH CONTRAST TECHNIQUE: Multidetector CT imaging of the chest was performed using the standard protocol during bolus administration of intravenous contrast. Multiplanar CT image reconstructions and MIPs were obtained to evaluate the vascular anatomy. CONTRAST:  169m OMNIPAQUE IOHEXOL 350 MG/ML SOLN COMPARISON:  September 17, 2020 FINDINGS: Cardiovascular: There is limited evaluation of the subsegmental pulmonary arteries secondary to overlying artifact and suboptimal opacification with intravenous contrast. No evidence  of pulmonary embolism. Normal heart size with moderate to marked severity coronary artery calcification. No pericardial effusion. Mediastinum/Nodes: No enlarged mediastinal, hilar, or axillary lymph nodes. Thyroid gland, trachea, and esophagus demonstrate no significant findings. Lungs/Pleura: Mild to moderate severity diffuse bilateral interstitial disease is seen involving predominantly the right middle lobe and bilateral lower lobes. Mild involvement of the periphery of the bilateral upper lobes is also noted. A stable 1.5 cm x 1.1 cm noncalcified lung nodule versus scarring is seen along the posterior aspect of the left upper lobe (axial CT images 26 through 33, CT series number 4). There is no evidence of a pleural effusion or pneumothorax. Upper Abdomen: No acute abnormality. Musculoskeletal: No chest wall abnormality. No acute or significant osseous findings. Review of the MIP images confirms the above findings. IMPRESSION: 1. No evidence of pulmonary embolism. 2. Findings suggestive of mild to moderate severity interstitial edema. A superimposed  infectious component cannot be excluded. 3. Stable posterior left upper lobe noncalcified lung nodule versus scarring. Consider one of the following in 3 months for both low-risk and high-risk individuals: (a) repeat chest CT, (b) follow-up PET-CT, or (c) tissue sampling. This recommendation follows the consensus statement: Guidelines for Management of Incidental Pulmonary Nodules Detected on CT Images: From the Fleischner Society 2017; Radiology 2017; 284:228-243. Electronically Signed   By: Virgina Norfolk M.D.   On: 04/04/2021 23:41   CT Abdomen Pelvis W Contrast  Result Date: 04/09/2021 CLINICAL DATA:  Right-sided abdominal pain for 10 days, shortness of breath EXAM: CT ABDOMEN AND PELVIS WITH CONTRAST TECHNIQUE: Multidetector CT imaging of the abdomen and pelvis was performed using the standard protocol following bolus administration of intravenous contrast. CONTRAST:  137m OMNIPAQUE IOHEXOL 300 MG/ML  SOLN COMPARISON:  CT abdomen pelvis, 04/04/2021 FINDINGS: Lower chest: No acute abnormality. Three-vessel coronary artery calcifications. Dependent bibasilar scarring and/or partial atelectasis. Hepatobiliary: No solid liver abnormality is seen. The gallbladder is mildly distended with gallbladder wall thickening and adjacent fat stranding. There are no radiopaque gallstones. No biliary ductal dilatation. Small pockets of subcapsular fluid adjacent to the gallbladder fossa (series 6, image 39, 62). Gallbladder wall thickening, or biliary dilatation. Pancreas: Unremarkable. No pancreatic ductal dilatation or surrounding inflammatory changes. Spleen: Normal in size without significant abnormality. Adrenals/Urinary Tract: Adrenal glands are unremarkable. Kidneys are normal, without renal calculi, solid lesion, or hydronephrosis. Bladder is unremarkable. Stomach/Bowel: Stomach is within normal limits. Appendix appears normal. No evidence of bowel wall thickening, distention, or inflammatory changes.  Vascular/Lymphatic: Aortic atherosclerosis. No enlarged abdominal or pelvic lymph nodes. Reproductive: No mass or other significant abnormality. Other: No abdominal wall hernia or abnormality. No abdominopelvic ascites. Musculoskeletal: No acute or significant osseous findings. IMPRESSION: 1. The gallbladder is mildly distended with gallbladder wall thickening and adjacent fat stranding, concerning for acute cholecystitis. 2. There are no radiopaque gallstones. No biliary ductal dilatation. 3. Small pockets of subcapsular fluid adjacent to the gallbladder fossa, possibly small volume ascites. The presence or absence of infection within this fluid is not established by CT. 4. Coronary artery disease. Aortic Atherosclerosis (ICD10-I70.0). Electronically Signed   By: AEddie CandleM.D.   On: 04/09/2021 13:43   CT ABDOMEN PELVIS W CONTRAST  Result Date: 04/04/2021 CLINICAL DATA:  Right lower quadrant abdominal pain. EXAM: CT ABDOMEN AND PELVIS WITH CONTRAST TECHNIQUE: Multidetector CT imaging of the abdomen and pelvis was performed using the standard protocol following bolus administration of intravenous contrast. CONTRAST:  1072mOMNIPAQUE IOHEXOL 350 MG/ML SOLN COMPARISON:  July 03, 2009 FINDINGS: Lower  chest: 6 mm and 7 mm pleural based noncalcified lung nodules are seen within the posterolateral aspect of the right lower lobe (axial CT images 3 and 7, CT series number 6). Mild bibasilar atelectasis is also noted. Hepatobiliary: No focal liver abnormality is seen. No gallstones, gallbladder wall thickening, or biliary dilatation. Pancreas: Unremarkable. No pancreatic ductal dilatation or surrounding inflammatory changes. Spleen: Normal in size without focal abnormality. Adrenals/Urinary Tract: Adrenal glands are unremarkable. Kidneys are normal in size, without renal calculi or hydronephrosis. Multiple bilateral simple renal cysts are seen. The largest is located within the anterior aspect of the mid left kidney  and measures approximately 3.1 cm x 2.5 cm. Bladder is unremarkable. Stomach/Bowel: Stomach is within normal limits. Appendix appears normal. No evidence of bowel wall thickening, distention, or inflammatory changes. Noninflamed diverticula are seen throughout the sigmoid colon. Vascular/Lymphatic: No significant vascular findings are present. No enlarged abdominal or pelvic lymph nodes. Reproductive: There is mild to moderate severity prostate gland enlargement. Other: No abdominal wall hernia or abnormality. No abdominopelvic ascites. Musculoskeletal: No acute or significant osseous findings. IMPRESSION: 1. 6 mm and 7 mm noncalcified right basilar lung nodules. Non-contrast chest CT at 3-6 months is recommended. If the nodules are stable at time of repeat CT, then future CT at 18-24 months (from today's scan) is considered optional for low-risk patients, but is recommended for high-risk patients. This recommendation follows the consensus statement: Guidelines for Management of Incidental Pulmonary Nodules Detected on CT Images: From the Fleischner Society 2017; Radiology 2017; 284:228-243. 2. Multiple bilateral simple renal cysts. 3. Sigmoid diverticulosis. 4. Enlarged prostate gland. Electronically Signed   By: Virgina Norfolk M.D.   On: 04/04/2021 23:47   US Abdomen Limited  Result Date: 04/09/2021 CLINICAL DATA:  Right abdominal pain EXAM: ULTRASOUND ABDOMEN LIMITED RIGHT UPPER QUADRANT COMPARISON:  None. FINDINGS: Gallbladder: No wall thickening visualized. Gallstones are present measuring up to 1.1 cm. Sludge is present. Small volume pericholecystic free fluid. No sonographic Murphy sign noted by sonographer. Common bile duct: Diameter: 5 mm, normal. There is possible echogenic material within the duct. Liver: No focal lesion identified. Increased parenchymal echogenicity. Portal vein is patent on color Doppler imaging with normal direction of blood flow towards the liver. Other: None. IMPRESSION:  Gallstones and sludge. Small volume pericholecystic free fluid. Indeterminate for acute cholecystitis. Consider nuclear medicine study. Mildly increased liver echogenicity probably reflects steatosis. Electronically Signed   By: Macy Mis M.D.   On: 04/09/2021 14:59   US Venous Img Lower Bilateral (DVT)  Result Date: 03/29/2021 CLINICAL DATA:  Bilateral lower extremity edema. History of previous DVT and skin cancer. Evaluate for acute or chronic DVT. EXAM: BILATERAL LOWER EXTREMITY VENOUS DOPPLER ULTRASOUND TECHNIQUE: Gray-scale sonography with graded compression, as well as color Doppler and duplex ultrasound were performed to evaluate the lower extremity deep venous systems from the level of the common femoral vein and including the common femoral, femoral, profunda femoral, popliteal and calf veins including the posterior tibial, peroneal and gastrocnemius veins when visible. The superficial great saphenous vein was also interrogated. Spectral Doppler was utilized to evaluate flow at rest and with distal augmentation maneuvers in the common femoral, femoral and popliteal veins. COMPARISON:  None. FINDINGS: RIGHT LOWER EXTREMITY Common Femoral Vein: No evidence of thrombus. Normal compressibility, respiratory phasicity and response to augmentation. Saphenofemoral Junction: No evidence of thrombus. Normal compressibility and flow on color Doppler imaging. Profunda Femoral Vein: No evidence of thrombus. Normal compressibility and flow on color Doppler imaging. Femoral Vein:  No evidence of thrombus. Normal compressibility, respiratory phasicity and response to augmentation. Popliteal Vein: No evidence of thrombus. Normal compressibility, respiratory phasicity and response to augmentation. Calf Veins: No evidence of thrombus. Normal compressibility and flow on color Doppler imaging. Superficial Great Saphenous Vein: No evidence of thrombus. Normal compressibility. Venous Reflux:  None. Other Findings:  None.  LEFT LOWER EXTREMITY Common Femoral Vein: No evidence of thrombus. Normal compressibility, respiratory phasicity and response to augmentation. Saphenofemoral Junction: No evidence of thrombus. Normal compressibility and flow on color Doppler imaging. Profunda Femoral Vein: No evidence of thrombus. Normal compressibility and flow on color Doppler imaging. Femoral Vein: No evidence of thrombus. Normal compressibility, respiratory phasicity and response to augmentation. Popliteal Vein: No evidence of thrombus. Normal compressibility, respiratory phasicity and response to augmentation. Calf Veins: No evidence of thrombus. Normal compressibility and flow on color Doppler imaging. Superficial Great Saphenous Vein: No evidence of thrombus. Normal compressibility. Venous Reflux:  None. Other Findings:  None. IMPRESSION: No evidence of acute or chronic DVT within either lower extremity. Electronically Signed   By: Sandi Mariscal M.D.   On: 03/29/2021 14:18   IR Perc Cholecystostomy  Result Date: 04/10/2021 INDICATION: 70 year old gentleman presented to the ED with complaints of abdominal pain. Imaging findings suspicious for cholecystitis. EXAM: Ultrasound and fluoroscopic guided cholecystostomy drain placement MEDICATIONS: Ceftriaxone 2 g IV; The antibiotic was administered within an appropriate time frame prior to the initiation of the procedure. ANESTHESIA/SEDATION: None FLUOROSCOPY TIME:  Fluoroscopy Time: 0 minutes 36 seconds (8 mGy). COMPLICATIONS: None immediate. PROCEDURE: Informed written consent was obtained from the patient's wife after a thorough discussion of the procedural risks, benefits and alternatives. All questions were addressed. Maximal Sterile Barrier Technique was utilized including caps, mask, sterile gowns, sterile gloves, sterile drape, hand hygiene and skin antiseptic. A timeout was performed prior to the initiation of the procedure. Patient positioned supine on the procedure table. Right upper  quadrant skin prepped and draped in usual fashion. Sterile ultrasound probe cover and gel utilized throughout the procedure. Following local lidocaine administration, 19 gauge Yueh needle advanced into the gallbladder lumen utilizing continuous ultrasound guidance. Yueh catheter exchanged for 10.2 [place] multipurpose pigtail drain over 0.035 inch guidewire. 10 mL of dark brown bile aspirated and sent for Gram stain and culture. Drain secured to skin with suture and connected to bag. IMPRESSION: 10.2 French cholecystostomy drain placed utilizing fluoroscopic and ultrasound guidance. Electronically Signed   By: Miachel Roux M.D.   On: 04/10/2021 09:26   DG CHEST PORT 1 VIEW  Result Date: 04/10/2021 CLINICAL DATA:  70 year old male endotracheal tube adjusted. EXAM: PORTABLE CHEST 1 VIEW COMPARISON:  0123 hours today and earlier. FINDINGS: Portable AP semi upright view at 0307 hours. Endotracheal tube tip now projects at the level the clavicles, 2-3 cm above the carina allowing for somewhat more kyphotic positioning. Enteric tube courses to the abdomen, tip not included. Continued low lung volumes with stable ventilation. Stable cardiac size and mediastinal contours. No pneumothorax or pleural effusion is evident. IMPRESSION: 1. Endotracheal tube tip satisfactory at the level the clavicles. Enteric tube courses to the abdomen, tip not included. 2. Continued low lung volumes with stable ventilation. Electronically Signed   By: Genevie Ann M.D.   On: 04/10/2021 04:04   DG CHEST PORT 1 VIEW  Result Date: 04/10/2021 CLINICAL DATA:  70 year old male status post intubation EXAM: PORTABLE CHEST 1 VIEW COMPARISON:  Earlier radiograph dated 04/09/2021. FINDINGS: Endotracheal tube with tip at the level of the carina. Recommend  retraction by approximately 4 cm for optimal positioning. Enteric tube extends below the diaphragm with side-port in the proximal stomach and tip beyond the inferior margin of the image. Shallow  inspiration. No significant interval change in bilateral streaky pulmonary densities, left greater right. No pleural effusion pneumothorax. Stable cardiomediastinal silhouette. No acute osseous pathology. IMPRESSION: 1. Endotracheal tube with tip at the level of the carina. Recommend retraction by approximately 4 cm for optimal positioning. 2. No significant interval change in the bilateral pulmonary densities. Electronically Signed   By: Anner Crete M.D.   On: 04/10/2021 01:39   DG Chest Port 1 View  Result Date: 04/09/2021 CLINICAL DATA:  Check endotracheal tube placement EXAM: PORTABLE CHEST 1 VIEW COMPARISON:  Film from earlier in the same day. FINDINGS: Cardiac shadow is stable. Endotracheal tube is noted at the level of the carina. This could be withdrawn 1-2 cm. Gastric catheter is noted within the stomach overall inspiratory effort is poor with bibasilar atelectasis and chronic fibrotic changes. IMPRESSION: New bibasilar atelectatic changes. Tubes and lines as described above. The endotracheal tube could be withdrawn 1-2 cm. Electronically Signed   By: Inez Catalina M.D.   On: 04/09/2021 22:54   DG CHEST PORT 1 VIEW  Result Date: 04/09/2021 CLINICAL DATA:  Intubated EXAM: PORTABLE CHEST 1 VIEW COMPARISON:  04/09/2021, CT 04/04/2021, 08/29/2020 FINDINGS: Interval intubation, tip of endotracheal tube is at the carina. Low lung volumes with coarse slightly reticular opacities, likely due to chronic interstitial disease. Redemonstrated left upper lung nodule. Stable cardiomediastinal silhouette. No pneumothorax. Interim placement of esophageal tube, the side port projects over the mid gastric region. Lucency in the left upper quadrant presumably represents air distended dilated bowel. IMPRESSION: 1. Endotracheal tube tip at the carina 2. Esophageal tube side-port overlies the mid gastric region 3. Lucency in the left upper quadrant probably represents air dilated bowel, suggest dedicated abdominal  radiograph for further evaluation These results will be called to the ordering clinician or representative by the Radiologist Assistant, and communication documented in the PACS or [employer]. Electronically Signed   By: Donavan Foil M.D.   On: 04/09/2021 19:38   DG Chest Port 1 View  Result Date: 04/09/2021 CLINICAL DATA:  Cough RIGHT-sided abdominal pain for 10 days. EXAM: PORTABLE CHEST 1 VIEW COMPARISON:  Apr 04, 2021. FINDINGS: Low lung volumes.  Diminished from previous imaging. Accounting for this cardiomediastinal contours are stable. Subtle opacity developing in the RIGHT mid chest and lower chest. No sign of effusion. Gastric distension similar to the prior study. On limited assessment no acute skeletal process. IMPRESSION: 1. Subtle opacity developing in the RIGHT mid and lower chest. Atelectasis versus developing infection. 2. Low lung volumes. Electronically Signed   By: Zetta Bills M.D.   On: 04/09/2021 12:56   DG Chest Portable 1 View  Result Date: 04/04/2021 CLINICAL DATA:  Bilateral rib pain radiating into the chest and for approximately 1 hour. No known injury. EXAM: PORTABLE CHEST 1 VIEW COMPARISON:  PA and lateral chest 03/23/2021 and single-view of the chest 08/29/2020. CT chest 09/17/2020. FINDINGS: Left upper lobe pulmonary nodule is unchanged. Lung volumes are low with left basilar atelectasis. No pneumothorax or pleural fluid. Cardiomegaly. No acute or focal bony abnormality. IMPRESSION: No acute disease. No change in a nodular opacity in the left upper lobe. Cardiomegaly. Electronically Signed   By: Inge Rise M.D.   On: 04/04/2021 17:45   ECHOCARDIOGRAM COMPLETE  Result Date: 04/11/2021    ECHOCARDIOGRAM REPORT  Patient Name:   Rishit Burkhalter Date of Exam: 04/11/2021 Medical Rec #:  [id number]          Height:       70.0 in Accession #:    [id number]         Weight:       201.1 lb Date of Birth:  06-Aug-1951          BSA:          2.092 m Patient Age:    70 years            BP:           98/61 mmHg Patient Gender: M                  HR:           81 bpm. Exam Location:  Inpatient Procedure: 2D Echo and Intracardiac Opacification Agent Indications:    abnormal ECG  History:        Patient has prior history of Echocardiogram examinations, most                 recent 08/26/2020. Sepsis; Risk Factors:Hypertension,                 Dyslipidemia and Diabetes.  Sonographer:    Johny Chess Referring Phys: Dewayne Hatch  Sonographer Comments: Echo performed with patient supine and on artificial respirator. IMPRESSIONS  1. Left ventricular ejection fraction, by estimation, is 55 to 60%. The left ventricle has normal function. The left ventricle has no regional wall motion abnormalities. Left ventricular diastolic parameters are consistent with Grade I diastolic dysfunction (impaired relaxation).  2. Peak RV-RA gradient 31 mmHg. Right ventricular systolic function is mildly reduced. The right ventricular size is moderately enlarged. Mildly D-shaped interventricular septum suggests a degree of RV pressure/volume overload.  3. Right atrial size was mildly dilated.  4. The mitral valve is normal in structure. No evidence of mitral valve regurgitation. No evidence of mitral stenosis.  5. The aortic valve is tricuspid. Aortic valve regurgitation is not visualized. Mild aortic valve sclerosis is present, with no evidence of aortic valve stenosis.  6. Aortic dilatation noted. There is mild dilatation of the aortic root, measuring 38 mm.  7. The IVC was not visualize. FINDINGS  Left Ventricle: Left ventricular ejection fraction, by estimation, is 55 to 60%. The left ventricle has normal function. The left ventricle has no regional wall motion abnormalities. Definity contrast agent was given IV to delineate the left ventricular  endocardial borders. The left ventricular internal cavity size was normal in size. There is no left ventricular hypertrophy. Left ventricular diastolic  parameters are consistent with Grade I diastolic dysfunction (impaired relaxation). Right Ventricle: Peak RV-RA gradient 31 mmHg. The right ventricular size is moderately enlarged. No increase in right ventricular wall thickness. Right ventricular systolic function is mildly reduced. Left Atrium: Left atrial size was normal in size. Right Atrium: Right atrial size was mildly dilated. Pericardium: There is no evidence of pericardial effusion. Mitral Valve: The mitral valve is normal in structure. There is mild calcification of the mitral valve leaflet(s). Mild mitral annular calcification. No evidence of mitral valve regurgitation. No evidence of mitral valve stenosis. Tricuspid Valve: The tricuspid valve is normal in structure. Tricuspid valve regurgitation is trivial. Aortic Valve: The aortic valve is tricuspid. Aortic valve regurgitation is not visualized. Mild aortic valve sclerosis is present, with no evidence of aortic valve stenosis. Pulmonic Valve: The pulmonic valve was not  well visualized. Pulmonic valve regurgitation is not visualized. Aorta: Aortic dilatation noted. There is mild dilatation of the aortic root, measuring 38 mm. Venous: The inferior vena cava was not well visualized. IAS/Shunts: No atrial level shunt detected by color flow Doppler.  LEFT VENTRICLE PLAX 2D LVIDd:         4.00 cm  Diastology LVIDs:         2.10 cm  LV e' medial:    8.59 cm/s LV PW:         0.90 cm  LV E/e' medial:  7.3 LV IVS:        0.90 cm  LV e' lateral:   9.79 cm/s LVOT diam:     2.20 cm  LV E/e' lateral: 6.4 LV SV:         60 LV SV Index:   29 LVOT Area:     3.80 cm  RIGHT VENTRICLE RV S prime:     13.90 cm/s TAPSE (M-mode): 2.4 cm LEFT ATRIUM             Index       RIGHT ATRIUM           Index LA diam:        3.10 cm 1.48 cm/m  RA Area:     18.90 cm LA Vol (A2C):   65.9 ml 31.50 ml/m RA Volume:   51.80 ml  24.76 ml/m LA Vol (A4C):   51.3 ml 24.52 ml/m LA Biplane Vol: 61.0 ml 29.16 ml/m  AORTIC VALVE LVOT Vmax:    80.30 cm/s LVOT Vmean:  61.400 cm/s LVOT VTI:    0.157 m  AORTA Ao Root diam: 3.80 cm Ao Asc diam:  3.50 cm MITRAL VALVE               TRICUSPID VALVE MV Area (PHT): 3.99 cm    TR Peak grad:   31.4 mmHg MV Decel Time: 190 msec    TR Vmax:        280.00 cm/s MV E velocity: 62.60 cm/s MV A velocity: 61.30 cm/s  SHUNTS MV E/A ratio:  1.02        Systemic VTI:  0.16 m                            Systemic Diam: 2.20 cm Loralie Champagne MD Electronically signed by Loralie Champagne MD Signature Date/Time: 04/11/2021/1:39:13 PM    Final       Subjective: - no chest pain, shortness of breath, no abdominal pain, nausea or vomiting.   Discharge Exam: BP (!) 157/96 (BP Location: Left Arm)   Pulse 96   Temp 99.5 F (37.5 C) (Oral)   Resp 16   Ht '5\' 10"'  (1.778 m)   Wt 93.4 kg   SpO2 96%   BMI 29.54 kg/m   General: Pt is alert, awake, not in acute distress Cardiovascular: RRR, S1/S2 +, no rubs, no gallops Respiratory: CTA bilaterally, no wheezing, no rhonchi Abdominal: Soft, NT, ND, bowel sounds + Extremities: no edema, no cyanosis   The results of significant diagnostics from this hospitalization (including imaging, microbiology, ancillary and laboratory) are listed below for reference.     Microbiology: Recent Results (from the past 240 hour(s))  Resp Panel by RT-PCR (Flu A&B, Covid) Nasopharyngeal Swab     Status: None   Collection Time: 04/09/21 12:17 PM   Specimen: Nasopharyngeal Swab; Nasopharyngeal(NP) swabs in vial transport medium  Result  Value Ref Range Status   SARS Coronavirus 2 by RT PCR NEGATIVE NEGATIVE Final    Comment: (NOTE) SARS-CoV-2 target nucleic acids are NOT DETECTED.  The SARS-CoV-2 RNA is generally detectable in upper respiratory specimens during the acute phase of infection. The lowest concentration of SARS-CoV-2 viral copies this assay can detect is 138 copies/mL. A negative result does not preclude SARS-Cov-2 infection and should not be used as the sole basis for  treatment or other patient management decisions. A negative result may occur with  improper specimen collection/handling, submission of specimen other than nasopharyngeal swab, presence of viral mutation(s) within the areas targeted by this assay, and inadequate number of viral copies(<138 copies/mL). A negative result must be combined with clinical observations, patient history, and epidemiological information. The expected result is Negative.  Fact Sheet for Patients:  EntrepreneurPulse.com.au  Fact Sheet for Healthcare Providers:  IncredibleEmployment.be  This test is no t yet approved or cleared by the [place] FDA and  has been authorized for detection and/or diagnosis of SARS-CoV-2 by FDA under an Emergency Use Authorization (EUA). This EUA will remain  in effect (meaning this test can be used) for the duration of the COVID-19 declaration under Section 564(b)(1) of the Act, 21 U.S.C.section 360bbb-3(b)(1), unless the authorization is terminated  or revoked sooner.       Influenza A by PCR NEGATIVE NEGATIVE Final   Influenza B by PCR NEGATIVE NEGATIVE Final    Comment: (NOTE) The Xpert Xpress SARS-CoV-2/FLU/RSV plus assay is intended as an aid in the diagnosis of influenza from Nasopharyngeal swab specimens and should not be used as a sole basis for treatment. Nasal washings and aspirates are unacceptable for Xpert Xpress SARS-CoV-2/FLU/RSV testing.  Fact Sheet for Patients: EntrepreneurPulse.com.au  Fact Sheet for Healthcare Providers: IncredibleEmployment.be  This test is not yet approved or cleared by the [place] FDA and has been authorized for detection and/or diagnosis of SARS-CoV-2 by FDA under an Emergency Use Authorization (EUA). This EUA will remain in effect (meaning this test can be used) for the duration of the COVID-19 declaration under Section 564(b)(1) of the Act, 21  U.S.C. section 360bbb-3(b)(1), unless the authorization is terminated or revoked.  Performed at [facility], [address] [place], [place] [address]   Blood culture (routine x 2)     Status: None   Collection Time: 04/09/21  3:12 PM   Specimen: BLOOD  Result Value Ref Range Status   Specimen Description BLOOD LEFT ANTECUBITAL  Final   Special Requests   Final    BOTTLES DRAWN AEROBIC AND ANAEROBIC Blood Culture adequate volume   Culture   Final    NO GROWTH 5 DAYS Performed at [facility], [address] [place], [place] [address]    Report Status 04/14/2021 FINAL  Final  Blood culture (routine x 2)     Status: None   Collection Time: 04/09/21  3:13 PM   Specimen: BLOOD LEFT HAND  Result Value Ref Range Status   Specimen Description BLOOD LEFT HAND  Final   Special Requests   Final    BOTTLES DRAWN AEROBIC AND ANAEROBIC Blood Culture adequate volume   Culture   Final    NO GROWTH 5 DAYS Performed at [facility], [address], [place], [place] [address]    Report Status 04/14/2021 FINAL  Final  MRSA PCR Screening     Status: None   Collection Time: 04/09/21  7:21 PM  Result Value Ref Range Status   MRSA by  PCR NEGATIVE NEGATIVE Final    Comment:        The GeneXpert MRSA Assay (FDA approved for NASAL specimens only), is one component of a comprehensive MRSA colonization surveillance program. It is not intended to diagnose MRSA infection nor to guide or monitor treatment for MRSA infections. Performed at [facility], [address] [place], [place] [address]   MRSA PCR Screening     Status: None   Collection Time: 04/09/21 10:20 PM   Specimen: Nasopharyngeal  Result Value Ref Range Status   MRSA by PCR NEGATIVE NEGATIVE Final    Comment:        The GeneXpert MRSA Assay (FDA approved for NASAL specimens only), is one component of a comprehensive MRSA colonization surveillance program. It is not intended to diagnose MRSA infection nor to guide or monitor  treatment for MRSA infections. Performed at [facility], [place] [address], [place], [place] [address]   Aerobic/Anaerobic Culture (surgical/deep wound)     Status: None (Preliminary result)   Collection Time: 04/10/21  1:23 AM   Specimen: Gallbladder; Abscess  Result Value Ref Range Status   Specimen Description GALL BLADDER FLUID  Final   Special Requests SYRINGE  Final   Gram Stain   Final    WBC PRESENT, PREDOMINANTLY MONONUCLEAR NO ORGANISMS SEEN    Culture   Final    NO GROWTH 4 DAYS NO ANAEROBES ISOLATED; CULTURE IN PROGRESS FOR 5 DAYS Performed at [facility], [place] [address] [place], [place] [address]    Report Status PENDING  Incomplete  Culture, respiratory (tracheal aspirate)     Status: None   Collection Time: 04/10/21  2:47 AM   Specimen: Tracheal Aspirate; Respiratory  Result Value Ref Range Status   Specimen Description TRACHEAL ASPIRATE  Final   Special Requests NONE  Final   Gram Stain   Final    MODERATE WBC PRESENT, PREDOMINANTLY PMN MODERATE GRAM POSITIVE COCCI    Culture   Final    ABUNDANT Consistent with normal respiratory flora. No Pseudomonas species isolated Performed at [place] [address] [place], [place] [address]    Report Status 04/12/2021 FINAL  Final     Labs: Basic Metabolic Panel: Recent Labs  Lab 04/09/21 1608 04/09/21 2235 04/10/21 0455 04/11/21 0910 04/12/21 0020 04/13/21 0438 04/14/21 0359 04/15/21 0417  NA  --    < > 133* 138 141 142 138 138  K  --    < > 3.6 4.1 3.8 4.1 3.8 4.2  CL  --    < > 100 106 105 104 100 100  CO2  --    < > '26 27 27 ' 32 30 34*  GLUCOSE  --    < > 182* 225* 78 163* 192* 182*  BUN  --    < > 26* 25* '19 12 11 12  ' CREATININE 0.82   < > 0.80 0.77 0.70 0.70 0.63 0.69  CALCIUM  --    < > 8.5* 8.2* 8.3* 8.6* 8.3* 8.4*  MG 1.9  --  2.3  --   --   --   --   --    < > = values in this interval not displayed.   Liver Function Tests: Recent Labs  Lab 04/11/21 0910  04/12/21 0020 04/13/21 0438 04/14/21 0359 04/15/21 0417  AST '25 25 29 18 18  ' ALT 28 28 34 28 24  ALKPHOS 54 55 53 49 53  BILITOT <  0.1* 0.3 0.2* 0.4 0.3  PROT 4.8* 5.0* 5.0* 5.1* 5.4*  ALBUMIN 1.8* 2.0* 2.0* 2.2* 2.3*   CBC: Recent Labs  Lab 04/09/21 2235 04/09/21 2322 04/11/21 0910 04/12/21 0020 04/13/21 0438 04/14/21 0359 04/15/21 0417  WBC 23.7*   < > 9.3 9.0 9.0 8.6 9.5  NEUTROABS 18.9*  --  6.8  --   --   --   --   HGB 13.1   < > 12.3* 12.3* 12.4* 12.6* 13.1  HCT 42.0   < > 38.3* 38.8* 40.9 40.5 41.4  MCV 88.8   < > 87.8 88.4 90.5 88.8 88.8  PLT 334   < > 216 237 251 220 260   < > = values in this interval not displayed.   CBG: Recent Labs  Lab 04/14/21 1555 04/14/21 2058 04/15/21 0028 04/15/21 0423 04/15/21 0731  GLUCAP 267* 186* 199* 174* 160*   Hgb A1c No results for input(s): HGBA1C in the last 72 hours. Lipid Profile No results for input(s): CHOL, HDL, LDLCALC, TRIG, CHOLHDL, LDLDIRECT in the last 72 hours. Thyroid function studies No results for input(s): TSH, T4TOTAL, T3FREE, THYROIDAB in the last 72 hours.  Invalid input(s): FREET3 Urinalysis    Component Value Date/Time   COLORURINE YELLOW 04/09/2021 1149   APPEARANCEUR HAZY (A) 04/09/2021 1149   LABSPEC 1.025 04/09/2021 1149   PHURINE 6.0 04/09/2021 1149   GLUCOSEU >=500 (A) 04/09/2021 1149   HGBUR NEGATIVE 04/09/2021 [place] 04/09/2021 1149   BILIRUBINUR small (A) 04/06/2021 1435   KETONESUR 20 (A) 04/09/2021 1149   PROTEINUR 100 (A) 04/09/2021 1149   UROBILINOGEN 0.2 04/06/2021 1435   UROBILINOGEN 0.2 01/21/2015 1822   NITRITE NEGATIVE 04/09/2021 1149   LEUKOCYTESUR NEGATIVE 04/09/2021 1149    FURTHER DISCHARGE INSTRUCTIONS:   Get Medicines reviewed and adjusted: Please take all your medications with you for your next visit with your Primary MD   Laboratory/radiological data: Please request your Primary MD to go over all hospital tests and  procedure/radiological results at the follow up, please ask your Primary MD to get all Hospital records sent to his/her office.   In some cases, they will be blood work, cultures and biopsy results pending at the time of your discharge. Please request that your primary care M.D. goes through all the records of your hospital data and follows up on these results.   Also Note the following: If you experience worsening of your admission symptoms, develop shortness of breath, life threatening emergency, suicidal or homicidal thoughts you must seek medical attention immediately by calling 911 or calling your MD immediately  if symptoms less severe.   You must read complete instructions/literature along with all the possible adverse reactions/side effects for all the Medicines you take and that have been prescribed to you. Take any new Medicines after you have completely understood and accpet all the possible adverse reactions/side effects.    Do not drive when taking Pain medications or sleeping medications (Benzodaizepines)   Do not take more than prescribed Pain, Sleep and Anxiety Medications. It is not advisable to combine anxiety,sleep and pain medications without talking with your primary care practitioner   Special Instructions: If you have smoked or chewed Tobacco  in the last 2 yrs please stop smoking, stop any regular Alcohol  and or any Recreational drug use.   Wear Seat belts while driving.   Please note: You were cared for by a hospitalist during your hospital stay. Once you are discharged, your  primary care physician will handle any further medical issues. Please note that NO REFILLS for any discharge medications will be authorized once you are discharged, as it is imperative that you return to your primary care physician (or establish a relationship with a primary care physician if you do not have one) for your post hospital discharge needs so that they can reassess your need for  medications and monitor your lab values.  Time coordinating discharge: 40 minutes  SIGNED:  Marzetta Board, MD, PhD 04/15/2021, 8:48 AM

## 2021-04-15 NOTE — Plan of Care (Signed)

## 2021-04-15 NOTE — Plan of Care (Signed)
  Problem: Education: Goal: Knowledge of General Education information will improve Description: Including pain rating scale, medication(s)/side effects and non-pharmacologic comfort measures Outcome: Adequate for Discharge   Problem: Health Behavior/Discharge Planning: Goal: Ability to manage health-related needs will improve Outcome: Adequate for Discharge   Problem: Clinical Measurements: Goal: Ability to maintain clinical measurements within normal limits will improve Outcome: Adequate for Discharge Goal: Will remain free from infection Outcome: Adequate for Discharge Goal: Diagnostic test results will improve Outcome: Adequate for Discharge Goal: Respiratory complications will improve 04/15/2021 1006 by Dolores Hoose, RN Outcome: Adequate for Discharge 04/15/2021 0724 by Dolores Hoose, RN Outcome: Progressing Goal: Cardiovascular complication will be avoided Outcome: Adequate for Discharge   Problem: Activity: Goal: Risk for activity intolerance will decrease 04/15/2021 1006 by Dolores Hoose, RN Outcome: Adequate for Discharge 04/15/2021 0724 by Dolores Hoose, RN Outcome: Progressing   Problem: Nutrition: Goal: Adequate nutrition will be maintained Outcome: Adequate for Discharge   Problem: Coping: Goal: Level of anxiety will decrease 04/15/2021 1006 by Dolores Hoose, RN Outcome: Adequate for Discharge 04/15/2021 0724 by Dolores Hoose, RN Outcome: Progressing   Problem: Elimination: Goal: Will not experience complications related to bowel motility Outcome: Adequate for Discharge Goal: Will not experience complications related to urinary retention 04/15/2021 1006 by Dolores Hoose, RN Outcome: Adequate for Discharge 04/15/2021 0724 by Dolores Hoose, RN Outcome: Progressing   Problem: Pain Managment: Goal: General experience of comfort will improve Outcome: Adequate for Discharge   Problem: Safety: Goal: Ability to remain free from  injury will improve Outcome: Adequate for Discharge   Problem: Skin Integrity: Goal: Risk for impaired skin integrity will decrease Outcome: Adequate for Discharge   Problem: Safety: Goal: Non-violent Restraint(s) Outcome: Adequate for Discharge   Problem: Acute Rehab OT Goals (only OT should resolve) Goal: Pt. Will Perform Grooming Outcome: Adequate for Discharge Goal: Pt. Will Transfer To Toilet Outcome: Adequate for Discharge Goal: OT Additional ADL Goal #1 Outcome: Adequate for Discharge

## 2021-04-15 NOTE — Plan of Care (Signed)
?  Problem: Clinical Measurements: ?Goal: Respiratory complications will improve ?Outcome: Progressing ?  ?Problem: Activity: ?Goal: Risk for activity intolerance will decrease ?Outcome: Progressing ?  ?Problem: Coping: ?Goal: Level of anxiety will decrease ?Outcome: Progressing ?  ?Problem: Elimination: ?Goal: Will not experience complications related to urinary retention ?Outcome: Progressing ?  ?

## 2021-04-15 NOTE — Discharge Instructions (Signed)
Follow with Celene Squibb, MD in 5-7 days  Please get a complete blood count and chemistry panel checked by your Primary MD at your next visit, and again as instructed by your Primary MD. Please get your medications reviewed and adjusted by your Primary MD.  Please request your Primary MD to go over all Hospital Tests and Procedure/Radiological results at the follow up, please get all Hospital records sent to your Prim MD by signing hospital release before you go home.  In some cases, there will be blood work, cultures and biopsy results pending at the time of your discharge. Please request that your primary care M.D. goes through all the records of your hospital data and follows up on these results.  If you had Pneumonia of Lung problems at the Hospital: Please get a 2 view Chest X ray done in 6-8 weeks after hospital discharge or sooner if instructed by your Primary MD.  If you have Congestive Heart Failure: Please call your Cardiologist or Primary MD anytime you have any of the following symptoms:  1) 3 pound weight gain in 24 hours or 5 pounds in 1 week  2) shortness of breath, with or without a dry hacking cough  3) swelling in the hands, feet or stomach  4) if you have to sleep on extra pillows at night in order to breathe  Follow cardiac low salt diet and 1.5 lit/day fluid restriction.  If you have diabetes Accuchecks 4 times/day, Once in AM empty stomach and then before each meal. Log in all results and show them to your primary doctor at your next visit. If any glucose reading is under 80 or above 300 call your primary MD immediately.  If you have Seizure/Convulsions/Epilepsy: Please do not drive, operate heavy machinery, participate in activities at heights or participate in high speed sports until you have seen by Primary MD or a Neurologist and advised to do so again. Per [facility] statutes, patients with seizures are not allowed to drive until they have been  seizure-free for six months.  Use caution when using heavy equipment or power tools. Avoid working on ladders or at heights. Take showers instead of baths. Ensure the water temperature is not too high on the home water heater. Do not go swimming alone. Do not lock yourself in a room alone (i.e. bathroom). When caring for infants or small children, sit down when holding, feeding, or changing them to minimize risk of injury to the child in the event you have a seizure. Maintain good sleep hygiene. Avoid alcohol.   If you had Gastrointestinal Bleeding: Please ask your Primary MD to check a complete blood count within one week of discharge or at your next visit. Your endoscopic/colonoscopic biopsies that are pending at the time of discharge, will also need to followed by your Primary MD.  Get Medicines reviewed and adjusted. Please take all your medications with you for your next visit with your Primary MD  Please request your Primary MD to go over all hospital tests and procedure/radiological results at the follow up, please ask your Primary MD to get all Hospital records sent to his/her office.  If you experience worsening of your admission symptoms, develop shortness of breath, life threatening emergency, suicidal or homicidal thoughts you must seek medical attention immediately by calling 911 or calling your MD immediately  if symptoms less severe.  You must read complete instructions/literature along with all the possible adverse reactions/side effects for all the Medicines you  take and that have been prescribed to you. Take any new Medicines after you have completely understood and accpet all the possible adverse reactions/side effects.   Do not drive or operate heavy machinery when taking Pain medications.   Do not take more than prescribed Pain, Sleep and Anxiety Medications  Special Instructions: If you have smoked or chewed Tobacco  in the last 2 yrs please stop smoking, stop any regular  Alcohol  and or any Recreational drug use.  Wear Seat belts while driving.  Please note You were cared for by a hospitalist during your hospital stay. If you have any questions about your discharge medications or the care you received while you were in the hospital after you are discharged, you can call the unit and asked to speak with the hospitalist on call if the hospitalist that took care of you is not available. Once you are discharged, your primary care physician will handle any further medical issues. Please note that NO REFILLS for any discharge medications will be authorized once you are discharged, as it is imperative that you return to your primary care physician (or establish a relationship with a primary care physician if you do not have one) for your aftercare needs so that they can reassess your need for medications and monitor your lab values.  You can reach the hospitalist office at phone [phone] or fax [phone]   If you do not have a primary care physician, you can call [phone] for a physician referral.  Activity: As tolerated with Full fall precautions use walker/cane & assistance as needed    Diet: diabetic  Disposition Home

## 2021-04-15 NOTE — Progress Notes (Signed)
DISCHARGE NOTE HOME Treylon Henard to be discharged Home per MD order. Discussed prescriptions and follow up appointments with the patient. Prescriptions given to patient; medication list explained in detail. Patient verbalized understanding.  Skin clean, dry and intact without evidence of skin break down, no evidence of skin tears noted. IV catheter discontinued intact. Site without signs and symptoms of complications. Dressing and pressure applied. Pt denies pain at the site currently. No complaints noted.  Patient and wife was showed how to flush biliary line and changing of right flank dressing. Instucted the importance of Incentive spirometer . Patient receptive to health instructions.  An After Visit Summary (AVS) was printed and given to the patient. Patient escorted via wheelchair, and discharged home via private auto.  Dolores Hoose, RN

## 2021-04-16 ENCOUNTER — Other Ambulatory Visit: Payer: Self-pay | Admitting: General Surgery

## 2021-04-16 DIAGNOSIS — K810 Acute cholecystitis: Secondary | ICD-10-CM

## 2021-04-23 ENCOUNTER — Ambulatory Visit (HOSPITAL_COMMUNITY): Payer: 59

## 2021-05-03 ENCOUNTER — Other Ambulatory Visit: Payer: Self-pay

## 2021-05-03 ENCOUNTER — Ambulatory Visit: Payer: 59 | Admitting: General Surgery

## 2021-05-03 ENCOUNTER — Encounter: Payer: Self-pay | Admitting: General Surgery

## 2021-05-03 VITALS — BP 117/78 | HR 104 | Temp 98.6°F | Resp 16 | Ht 70.0 in | Wt 186.0 lb

## 2021-05-03 DIAGNOSIS — K8020 Calculus of gallbladder without cholecystitis without obstruction: Secondary | ICD-10-CM | POA: Diagnosis not present

## 2021-05-03 NOTE — Progress Notes (Signed)
Subjective:     Tim Day  Patient here for follow-up, status post admission for sepsis and acute cholecystitis.  He has multiple medical problems including chronic oxygen therapy for COVID positivity.  He initially was seen at [facility] and diagnosed with acute cholecystitis and sepsis.  He subsequently was transferred down to [facility] and underwent cholecystostomy tube placement.  Since discharge, he states his abdominal pain has resolved.  He has only minimal pain at the cholecystostomy tube site.  He denies any nausea or vomiting.  He is still significantly short of breath and requires home O2.  He was on home O2 before his admission.  He is not taking any antibiotics at the present time. Objective:    BP 117/78   Pulse (!) 104   Temp 98.6 F (37 C) (Other (Comment))   Resp 16   Ht 5\' 10"  (1.778 m)   Wt 186 lb (84.4 kg)   SpO2 95%   BMI 26.69 kg/m   General:  alert, cooperative and fatigued  Eyes are without scleral icterus Abdomen is soft, nontender, nondistended.  Cholecystostomy tube is in place in the right upper quadrant with clear green drainage present.  Discharge summary reviewed     Assessment:    Status post cholecystostomy tube for acute cholecystitis, sepsis.  Patient without biliary symptoms at the present time.  Patient is scheduled for a follow-up cholecystostomy tube evaluation by interventional radiology on 05/15/2021.  Further management is pending those results.  Ideally, if his cholelithiasis can be managed nonsurgically, that would be better for him as he is at higher risk for general anesthesia due to his respiratory status.    Plan:   Further surgical management is pending interventional radiology evaluation.

## 2021-05-08 ENCOUNTER — Ambulatory Visit
Admission: RE | Admit: 2021-05-08 | Discharge: 2021-05-08 | Disposition: A | Discharge status: Home / Self Care | Payer: 59 | Source: Ambulatory Visit | Attending: Student | Admitting: Student

## 2021-05-08 ENCOUNTER — Ambulatory Visit
Admission: RE | Admit: 2021-05-08 | Discharge: 2021-05-08 | Disposition: A | Discharge status: Home / Self Care | Payer: 59 | Source: Ambulatory Visit | Attending: General Surgery | Admitting: General Surgery

## 2021-05-08 ENCOUNTER — Other Ambulatory Visit (HOSPITAL_COMMUNITY): Payer: Self-pay

## 2021-05-08 DIAGNOSIS — K810 Acute cholecystitis: Secondary | ICD-10-CM

## 2021-05-08 HISTORY — PX: IR RADIOLOGIST EVAL & MGMT: IMG5224

## 2021-05-08 MED ORDER — NORMAL SALINE FLUSH 0.9 % IV SOLN
INTRAVENOUS | 0 refills | Status: DC
  Filled 2021-05-08: qty 300, 30d supply, fill #0
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
## 2021-05-14 ENCOUNTER — Telehealth: Payer: Self-pay | Admitting: Student

## 2021-05-14 ENCOUNTER — Other Ambulatory Visit (HOSPITAL_COMMUNITY): Payer: Self-pay | Admitting: Student

## 2021-05-14 DIAGNOSIS — K810 Acute cholecystitis: Secondary | ICD-10-CM

## 2021-05-14 NOTE — Telephone Encounter (Signed)
Patient called IR concerned about his percutaneous cholecystostomy drain not having any output for approximately 24 hours. Patient seen in IR 05/08/21 for cholangiogram with showed drain in proper position/functioning properly.   Patient states he feels fine, drain flushes but some leaks out around skin insertion site. Patient also stated there is "thick, slimy" drainage coming around from around the tube. IR will plan to see patient hopefully today or tomorrow for a cholangiogram/possible drain exchange. A scheduler will call him to arrange the date/time/location.  Soyla Dryer, [place] [phone] 05/14/2021, 9:20 AM

## 2021-05-15 ENCOUNTER — Other Ambulatory Visit: Payer: 59

## 2021-05-16 ENCOUNTER — Other Ambulatory Visit: Payer: Self-pay

## 2021-05-16 ENCOUNTER — Ambulatory Visit (HOSPITAL_COMMUNITY)
Admission: RE | Admit: 2021-05-16 | Discharge: 2021-05-16 | Disposition: A | Discharge status: Home / Self Care | Payer: 59 | Source: Ambulatory Visit | Attending: Student | Admitting: Student

## 2021-05-16 ENCOUNTER — Other Ambulatory Visit (HOSPITAL_COMMUNITY): Payer: Self-pay | Admitting: Student

## 2021-05-16 DIAGNOSIS — Z7984 Long term (current) use of oral hypoglycemic drugs: Secondary | ICD-10-CM | POA: Insufficient documentation

## 2021-05-16 DIAGNOSIS — Z79899 Other long term (current) drug therapy: Secondary | ICD-10-CM | POA: Diagnosis not present

## 2021-05-16 DIAGNOSIS — Z434 Encounter for attention to other artificial openings of digestive tract: Secondary | ICD-10-CM | POA: Diagnosis present

## 2021-05-16 DIAGNOSIS — K810 Acute cholecystitis: Secondary | ICD-10-CM

## 2021-05-16 DIAGNOSIS — Z794 Long term (current) use of insulin: Secondary | ICD-10-CM | POA: Insufficient documentation

## 2021-05-16 HISTORY — PX: IR EXCHANGE BILIARY DRAIN: IMG6046

## 2021-05-16 MED ORDER — LIDOCAINE HCL (PF) 1 % IJ SOLN
INTRAMUSCULAR | Status: AC
  Filled 2021-05-16: qty 30

## 2021-05-16 MED ORDER — LIDOCAINE HCL (PF) 1 % IJ SOLN
INTRAMUSCULAR | Status: DC | PRN
  Administered 2021-05-16: 10 mL

## 2021-05-16 MED ORDER — IOHEXOL 300 MG/ML  SOLN
50.0000 mL | Freq: Once | INTRAMUSCULAR | Status: AC | PRN
  Administered 2021-05-16: 20 mL

## 2021-05-16 NOTE — Progress Notes (Signed)
Referring Physician(s): Covington,Jamie R  Supervising Physician: Mir, Biochemist, clinical  Patient Status:  [facility] outpatient  Chief Complaint: Percutaneous cholecystostomy painful, not draining.  Subjective:  Per patient and wife drain was working well and patient had injection on 6/7 with Dr. Serafina Royals, the following day the drain was not putting anything out and was more tender. It has had basically no output since. Patient's wife tried to flush the drain multiple times but the flush seemed to mostly come out at the skin. He is feeling well otherwise and is looking forward to the spyglass procedure.  Allergies: Patient has no known allergies.  Medications: Prior to Admission medications   Medication Sig Start Date End Date Taking? Authorizing Provider  ACCU-CHEK GUIDE test strip USE TO TEST TWICE DAILY.D 02/06/21   [provider]  albuterol (VENTOLIN HFA) 108 (90 Base) MCG/ACT inhaler Inhale 2 puffs into the lungs every 6 (six) hours as needed for wheezing or shortness of breath. 09/28/20   Oswald Hillock, MD  alclomethasone (ACLOVATE) 0.05 % cream Apply topically 2 (two) times daily as needed (Rash). 03/13/21   Sheffield, Ronalee Red, PA-C  ALPRAZolam (XANAX) 0.25 MG tablet Take 0.25 mg by mouth 2 (two) times daily as needed for anxiety.    [provider]  alum & mag hydroxide-simeth (MAALOX/MYLANTA) 200-200-20 MG/5ML suspension Take 30 mLs by mouth every 4 (four) hours as needed for indigestion. 09/28/20   Oswald Hillock, MD  ascorbic acid (VITAMIN C) 500 MG tablet Take 1 tablet (500 mg total) by mouth daily. 08/04/20   Johnson, Clanford L, MD  blood glucose meter kit and supplies KIT Dispense based on patient and insurance preference. Use up to four times daily as directed. (FOR ICD-9 250.00, 250.01). 08/14/20   Little Ishikawa, MD  Cholecalciferol (VITAMIN D3) 50 MCG (2000 UT) TABS Take 1 tablet by mouth daily.    [provider]  co-enzyme Q-10 50 MG capsule Take 50 mg by  mouth 2 (two) times daily.    [provider]  famotidine (PEPCID) 20 MG tablet Take 20 mg by mouth daily. 03/13/21   [provider]  FIASP FLEXTOUCH 100 UNIT/ML FlexTouch Pen Inject 1-3 Units into the skin every evening. 02/26/21   [provider]  furosemide (LASIX) 20 MG tablet Take 20 mg by mouth daily. 03/28/21   [provider]  ibuprofen (ADVIL) 600 MG tablet Take 1 tablet (600 mg total) by mouth every 6 (six) hours as needed. 04/06/21   Melynda Ripple, MD  insulin aspart (NOVOLOG) 100 UNIT/ML injection Inject 0-15 Units into the skin 3 (three) times daily with meals. Sliding scale insulin Less than 70 initiate hypoglycemia protocol 70-120 - 0 units,  120-150-  2 units,  151-200 -3 units,  201-250- 5 units,  251-300- 8 units,  301-350- 11 units,  351-400- 15 units Greater than 400 call MD Patient taking differently: Inject 0-15 Units into the skin 3 (three) times daily with meals. Sliding scale insulin Less than 70 initiate hypoglycemia protocol 70-120 - 0 units,  120-150-  2 units,  151-200 -3 units,  201-250- 5 units,  251-300- 8 units,  301-350- 11 units,  351-400- 15 units Greater than 400 call MD 09/28/20   Oswald Hillock, MD  Insulin Pen Needle (PEN NEEDLES) 32G X 4 MM MISC 1 Package by Does not apply route 4 (four) times daily -  before meals and at bedtime. 08/14/20   Little Ishikawa, MD  JANUVIA 100 MG  tablet Take 100 mg by mouth daily. 03/13/21   [provider]  metFORMIN (GLUCOPHAGE) 1000 MG tablet Take 1,000 mg by mouth 2 (two) times daily with a meal.    [provider]  Multiple Vitamin (MULTIVITAMIN) capsule Take 1 capsule by mouth daily.    [provider]  olmesartan-hydrochlorothiazide (BENICAR HCT) 20-12.5 MG tablet Take 1 tablet by mouth daily. 03/05/21   [provider]  ondansetron (ZOFRAN) 4 MG tablet Take 1 tablet (4 mg total) by mouth every 6 (six) hours as needed for nausea. 09/28/20   Oswald Hillock,  MD  pantoprazole (PROTONIX) 40 MG tablet Take 40 mg by mouth daily.    [provider]  rosuvastatin (CRESTOR) 5 MG tablet Take 5 mg by mouth daily. 03/05/21   [provider]  sildenafil (VIAGRA) 100 MG tablet Take 100 mg by mouth at bedtime. 02/26/21   [provider]  Sodium Chloride Flush (NORMAL SALINE FLUSH) 0.9 % SOLN use as directed 05/08/21     TOUJEO MAX SOLOSTAR 300 UNIT/ML Solostar Pen Inject 30 Units into the skin in the morning. 02/26/21   [provider]  zinc sulfate 220 (50 Zn) MG capsule Take 1 capsule (220 mg total) by mouth daily. 08/04/20   Johnson, Clanford L, MD  zolpidem (AMBIEN) 10 MG tablet Take 10 mg by mouth at bedtime as needed. 02/27/21   [provider]     Vital Signs: There were no vitals taken for this visit.  Physical Exam Constitutional:      General: He is not in acute distress. Pulmonary:     Effort: Pulmonary effort is normal.  Abdominal:     Comments: (+) perc chole to gravity with scant tan output, insertion site unremarkable.  Skin:    General: Skin is warm and dry.     Coloration: Skin is not jaundiced.  Neurological:     Mental Status: He is alert. Mental status is at baseline.    Imaging: No results found.  Labs:  CBC: Recent Labs    04/12/21 0020 04/13/21 0438 04/14/21 0359 04/15/21 0417  WBC 9.0 9.0 8.6 9.5  HGB 12.3* 12.4* 12.6* 13.1  HCT 38.8* 40.9 40.5 41.4  PLT 237 251 220 260    COAGS: Recent Labs    08/23/20 0400 08/23/20 1719 08/24/20 0423 08/25/20 0533 04/10/21 0455  INR  --   --   --   --  1.4*  APTT 84* 67* 77* 91*  --     BMP: Recent Labs    09/02/20 1635 09/03/20 0220 09/04/20 0247 09/05/20 0321 09/06/20 0202 04/12/21 0020 04/13/21 0438 04/14/21 0359 04/15/21 0417  NA 131* 131* 131* 131*   < > 141 142 138 138  K 5.0 5.0 4.8 5.0   < > 3.8 4.1 3.8 4.2  CL 92* 91* 93* 94*   < > 105 104 100 100  CO2 32 '31 30 28   ' < > 27 32 30 34*  GLUCOSE 145* 230* 225*  159*   < > 78 163* 192* 182*  BUN '22 21 23 21   ' < > '19 12 11 12  ' CALCIUM 8.3* 8.4* 8.0* 8.3*   < > 8.3* 8.6* 8.3* 8.4*  CREATININE 0.52* 0.66 0.68 0.54*   < > 0.70 0.70 0.63 0.69  GFRNONAA >60 >60 >60 >60   < > >60 >60 >60 >60  GFRAA >60 >60 >60 >60  --   --   --   --   --    < > =  values in this interval not displayed.    LIVER FUNCTION TESTS: Recent Labs    04/12/21 0020 04/13/21 0438 04/14/21 0359 04/15/21 0417  BILITOT 0.3 0.2* 0.4 0.3  AST '25 29 18 18  ' ALT 28 34 28 24  ALKPHOS 55 53 49 53  PROT 5.0* 5.0* 5.1* 5.4*  ALBUMIN 2.0* 2.0* 2.2* 2.3*    Assessment and Plan:  69 y/o M with percutaneous cholecystotomy placed 04/10/21 who presents today for cholangiogram with possible exchange due to poor output and pain following drain injection on 6/7.  Risks and benefits discussed with the patient including bleeding, infection, damage to adjacent structures.  All of the patient's questions were answered, patient is agreeable to proceed.  Consent signed and in chart.  Electronically Signed: Joaquim Nam, PA-C 05/16/2021, 11:55 AM   I spent a total of 15 Minutes at the the patient's bedside AND on the patient's hospital floor or unit, greater than 50% of which was counseling/coordinating care for cholangiogram with possible exchange.

## 2021-05-22 ENCOUNTER — Other Ambulatory Visit (HOSPITAL_COMMUNITY): Payer: Self-pay | Admitting: Interventional Radiology

## 2021-05-22 DIAGNOSIS — K8011 Calculus of gallbladder with chronic cholecystitis with obstruction: Secondary | ICD-10-CM

## 2021-05-23 ENCOUNTER — Other Ambulatory Visit (HOSPITAL_COMMUNITY): Payer: Self-pay | Admitting: Interventional Radiology

## 2021-05-23 DIAGNOSIS — K810 Acute cholecystitis: Secondary | ICD-10-CM

## 2021-05-24 ENCOUNTER — Other Ambulatory Visit: Payer: Self-pay

## 2021-05-24 ENCOUNTER — Ambulatory Visit (HOSPITAL_COMMUNITY)
Admission: RE | Admit: 2021-05-24 | Discharge: 2021-05-24 | Disposition: A | Discharge status: Home / Self Care | Payer: 59 | Source: Ambulatory Visit | Attending: Interventional Radiology | Admitting: Interventional Radiology

## 2021-05-24 ENCOUNTER — Other Ambulatory Visit (HOSPITAL_COMMUNITY): Payer: Self-pay | Admitting: Interventional Radiology

## 2021-05-24 DIAGNOSIS — K810 Acute cholecystitis: Secondary | ICD-10-CM | POA: Diagnosis not present

## 2021-05-24 DIAGNOSIS — Z434 Encounter for attention to other artificial openings of digestive tract: Secondary | ICD-10-CM | POA: Insufficient documentation

## 2021-05-24 HISTORY — PX: IR EXCHANGE BILIARY DRAIN: IMG6046

## 2021-05-24 MED ORDER — LIDOCAINE HCL 1 % IJ SOLN
INTRAMUSCULAR | Status: AC
  Filled 2021-05-24: qty 20

## 2021-05-24 MED ORDER — IOHEXOL 300 MG/ML  SOLN
50.0000 mL | Freq: Once | INTRAMUSCULAR | Status: AC | PRN
  Administered 2021-05-24: 10 mL

## 2021-05-25 ENCOUNTER — Other Ambulatory Visit: Payer: Self-pay | Admitting: Radiology

## 2021-05-28 ENCOUNTER — Other Ambulatory Visit: Payer: Self-pay

## 2021-05-28 ENCOUNTER — Encounter (HOSPITAL_COMMUNITY): Payer: Self-pay

## 2021-05-28 ENCOUNTER — Ambulatory Visit (HOSPITAL_COMMUNITY)
Admission: RE | Admit: 2021-05-28 | Discharge: 2021-05-28 | Disposition: A | Discharge status: Home / Self Care | Payer: 59 | Source: Ambulatory Visit | Attending: Interventional Radiology | Admitting: Interventional Radiology

## 2021-05-28 ENCOUNTER — Other Ambulatory Visit: Payer: Self-pay | Admitting: Radiology

## 2021-05-28 DIAGNOSIS — K8020 Calculus of gallbladder without cholecystitis without obstruction: Secondary | ICD-10-CM | POA: Diagnosis not present

## 2021-05-28 DIAGNOSIS — Z79899 Other long term (current) drug therapy: Secondary | ICD-10-CM | POA: Diagnosis not present

## 2021-05-28 DIAGNOSIS — Z7982 Long term (current) use of aspirin: Secondary | ICD-10-CM | POA: Diagnosis not present

## 2021-05-28 DIAGNOSIS — Z7984 Long term (current) use of oral hypoglycemic drugs: Secondary | ICD-10-CM | POA: Insufficient documentation

## 2021-05-28 DIAGNOSIS — J9610 Chronic respiratory failure, unspecified whether with hypoxia or hypercapnia: Secondary | ICD-10-CM | POA: Diagnosis not present

## 2021-05-28 DIAGNOSIS — Z794 Long term (current) use of insulin: Secondary | ICD-10-CM | POA: Insufficient documentation

## 2021-05-28 DIAGNOSIS — Z8616 Personal history of COVID-19: Secondary | ICD-10-CM | POA: Diagnosis not present

## 2021-05-28 DIAGNOSIS — K8011 Calculus of gallbladder with chronic cholecystitis with obstruction: Secondary | ICD-10-CM

## 2021-05-28 HISTORY — PX: IR EXCHANGE BILIARY DRAIN: IMG6046

## 2021-05-28 HISTORY — PX: IR REMOVAL OF CALCULI/DEBRIS BILIARY DUCT/GB: IMG6054

## 2021-05-28 LAB — CBC
HCT: 41.5 % (ref 39.0–52.0)
Hemoglobin: 13.1 g/dL (ref 13.0–17.0)
MCH: 27.8 pg (ref 26.0–34.0)
MCHC: 31.6 g/dL (ref 30.0–36.0)
MCV: 88.1 fL (ref 80.0–100.0)
Platelets: 258 10*3/uL (ref 150–400)
RBC: 4.71 MIL/uL (ref 4.22–5.81)
RDW: 14.8 % (ref 11.5–15.5)
WBC: 7.3 10*3/uL (ref 4.0–10.5)
nRBC: 0 % (ref 0.0–0.2)

## 2021-05-28 LAB — PROTIME-INR
INR: 1.1 (ref 0.8–1.2)
Prothrombin Time: 13.7 seconds (ref 11.4–15.2)

## 2021-05-28 LAB — GLUCOSE, CAPILLARY: Glucose-Capillary: 124 mg/dL — ABNORMAL HIGH (ref 70–99)

## 2021-05-28 MED ORDER — MIDAZOLAM HCL 2 MG/2ML IJ SOLN
INTRAMUSCULAR | Status: AC
  Filled 2021-05-28: qty 2

## 2021-05-28 MED ORDER — SODIUM CHLORIDE 0.9 % IV SOLN: INTRAVENOUS | Status: DC

## 2021-05-28 MED ORDER — FENTANYL CITRATE (PF) 100 MCG/2ML IJ SOLN
INTRAMUSCULAR | Status: AC
  Filled 2021-05-28: qty 2

## 2021-05-28 MED ORDER — LIDOCAINE HCL 1 % IJ SOLN
INTRAMUSCULAR | Status: AC | PRN
  Administered 2021-05-28: 10 mL

## 2021-05-28 MED ORDER — FENTANYL CITRATE (PF) 100 MCG/2ML IJ SOLN
INTRAMUSCULAR | Status: AC | PRN
  Administered 2021-05-28 (×2): 25 ug via INTRAVENOUS
  Administered 2021-05-28: 50 ug via INTRAVENOUS
  Administered 2021-05-28 (×2): 25 ug via INTRAVENOUS

## 2021-05-28 MED ORDER — MIDAZOLAM HCL 5 MG/5ML IJ SOLN
INTRAMUSCULAR | Status: AC | PRN
  Administered 2021-05-28: 0.5 mg via INTRAVENOUS

## 2021-05-28 MED ORDER — LIDOCAINE HCL 1 % IJ SOLN
INTRAMUSCULAR | Status: AC
  Filled 2021-05-28: qty 20

## 2021-05-28 MED ORDER — MIDAZOLAM HCL 2 MG/2ML IJ SOLN
INTRAMUSCULAR | Status: AC | PRN
  Administered 2021-05-28 (×3): 0.5 mg via INTRAVENOUS
  Administered 2021-05-28: 1 mg via INTRAVENOUS

## 2021-05-28 MED ORDER — IOHEXOL 300 MG/ML  SOLN
100.0000 mL | Freq: Once | INTRAMUSCULAR | Status: AC | PRN
  Administered 2021-05-28: 40 mL

## 2021-05-28 MED ORDER — SODIUM CHLORIDE 0.9% FLUSH: 5.0000 mL | Freq: Three times a day (TID) | INTRAVENOUS | Status: DC

## 2021-05-28 MED ORDER — SODIUM CHLORIDE 0.9 % IV SOLN
2.0000 g | Freq: Once | INTRAVENOUS | Status: AC
  Administered 2021-05-28: 2 g via INTRAVENOUS
  Filled 2021-05-28: qty 20

## 2021-05-28 NOTE — Procedures (Signed)
Interventional Radiology Procedure Note  Procedure:  1) Cholecystogram 2) Percutaneous endoscopic gallstone retrieval  Findings: Please refer to procedural dictation for full description. Numerous gallstones retrieved via snare basket and aspiration.  Small residual stone burden. 14 Fr multipurpose drain replaced to bag drainage.  Complications: None immediate  Estimated Blood Loss: < 5 mL  Recommendations: Keep drain in place, to bag drainage. Return in 2 weeks for repeat session to retrieve remaining stones.   Ruthann Cancer, MD Pager: [phone]
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
## 2021-05-30 ENCOUNTER — Other Ambulatory Visit (HOSPITAL_COMMUNITY): Payer: Self-pay | Admitting: Interventional Radiology

## 2021-05-30 ENCOUNTER — Encounter (HOSPITAL_COMMUNITY): Payer: Self-pay

## 2021-05-30 DIAGNOSIS — K8011 Calculus of gallbladder with chronic cholecystitis with obstruction: Secondary | ICD-10-CM

## 2021-06-05 ENCOUNTER — Telehealth (HOSPITAL_COMMUNITY): Payer: Self-pay

## 2021-06-05 NOTE — Telephone Encounter (Signed)
Returned wife's call about scheduling her husband's procedure. Informed her that Anderson Malta is waiting to hear back from the schedulers to get both docs here. I've sent Anderson Malta a message to give the patient a call tomorrow with an update. [name] was fine with this. AW

## 2021-06-07 ENCOUNTER — Telehealth (HOSPITAL_COMMUNITY): Payer: Self-pay | Admitting: Radiology

## 2021-06-07 NOTE — Telephone Encounter (Signed)
Called pt and wife. Told them I would be back in touch with them as soon as I have a date for Dr. Serafina Royals to be at [facility] to perform pt's procedure. JM

## 2021-06-08 ENCOUNTER — Other Ambulatory Visit (HOSPITAL_COMMUNITY): Payer: Self-pay | Admitting: Interventional Radiology

## 2021-06-08 DIAGNOSIS — K8020 Calculus of gallbladder without cholecystitis without obstruction: Secondary | ICD-10-CM

## 2021-06-15 DIAGNOSIS — E1165 Type 2 diabetes mellitus with hyperglycemia: Secondary | ICD-10-CM | POA: Diagnosis not present

## 2021-06-15 DIAGNOSIS — E785 Hyperlipidemia, unspecified: Secondary | ICD-10-CM | POA: Diagnosis not present

## 2021-06-22 ENCOUNTER — Other Ambulatory Visit: Payer: Self-pay | Admitting: Physician Assistant

## 2021-06-22 NOTE — Addendum Note (Signed)
Addended by: Candiss Norse A on: 06/22/2021 01:14 PM   Modules accepted: Orders, SmartSet

## 2021-06-25 ENCOUNTER — Ambulatory Visit (HOSPITAL_COMMUNITY)
Admission: RE | Admit: 2021-06-25 | Discharge: 2021-06-25 | Disposition: A | Discharge status: Home / Self Care | Payer: HMO | Source: Ambulatory Visit | Attending: Interventional Radiology | Admitting: Interventional Radiology

## 2021-06-25 ENCOUNTER — Other Ambulatory Visit: Payer: Self-pay

## 2021-06-25 ENCOUNTER — Encounter (HOSPITAL_COMMUNITY): Payer: Self-pay

## 2021-06-25 ENCOUNTER — Other Ambulatory Visit: Payer: Self-pay | Admitting: Interventional Radiology

## 2021-06-25 DIAGNOSIS — Z7984 Long term (current) use of oral hypoglycemic drugs: Secondary | ICD-10-CM | POA: Diagnosis not present

## 2021-06-25 DIAGNOSIS — Z7982 Long term (current) use of aspirin: Secondary | ICD-10-CM | POA: Diagnosis not present

## 2021-06-25 DIAGNOSIS — Z8616 Personal history of COVID-19: Secondary | ICD-10-CM | POA: Insufficient documentation

## 2021-06-25 DIAGNOSIS — K831 Obstruction of bile duct: Secondary | ICD-10-CM | POA: Diagnosis not present

## 2021-06-25 DIAGNOSIS — K8020 Calculus of gallbladder without cholecystitis without obstruction: Secondary | ICD-10-CM | POA: Diagnosis not present

## 2021-06-25 DIAGNOSIS — K8000 Calculus of gallbladder with acute cholecystitis without obstruction: Secondary | ICD-10-CM | POA: Diagnosis not present

## 2021-06-25 DIAGNOSIS — K810 Acute cholecystitis: Secondary | ICD-10-CM

## 2021-06-25 DIAGNOSIS — Z79899 Other long term (current) drug therapy: Secondary | ICD-10-CM | POA: Insufficient documentation

## 2021-06-25 DIAGNOSIS — Z794 Long term (current) use of insulin: Secondary | ICD-10-CM | POA: Diagnosis not present

## 2021-06-25 HISTORY — PX: IR REMOVAL OF CALCULI/DEBRIS BILIARY DUCT/GB: IMG6054

## 2021-06-25 LAB — CBC
HCT: 41.5 % (ref 39.0–52.0)
Hemoglobin: 13.4 g/dL (ref 13.0–17.0)
MCH: 28.3 pg (ref 26.0–34.0)
MCHC: 32.3 g/dL (ref 30.0–36.0)
MCV: 87.7 fL (ref 80.0–100.0)
Platelets: 145 10*3/uL — ABNORMAL LOW (ref 150–400)
RBC: 4.73 MIL/uL (ref 4.22–5.81)
RDW: 14.6 % (ref 11.5–15.5)
WBC: 5.9 10*3/uL (ref 4.0–10.5)
nRBC: 0 % (ref 0.0–0.2)

## 2021-06-25 LAB — BASIC METABOLIC PANEL
Anion gap: 6 (ref 5–15)
BUN: 12 mg/dL (ref 8–23)
CO2: 32 mmol/L (ref 22–32)
Calcium: 9.2 mg/dL (ref 8.9–10.3)
Chloride: 99 mmol/L (ref 98–111)
Creatinine, Ser: 0.7 mg/dL (ref 0.61–1.24)
GFR, Estimated: >60 mL/min (ref >60)
Glucose, Bld: 152 mg/dL — ABNORMAL HIGH (ref 70–99)
Potassium: 3.9 mmol/L (ref 3.5–5.1)
Sodium: 137 mmol/L (ref 135–145)

## 2021-06-25 LAB — PROTIME-INR
INR: 1.1 (ref 0.8–1.2)
Prothrombin Time: 14.1 seconds (ref 11.4–15.2)

## 2021-06-25 MED ORDER — IOHEXOL 300 MG/ML  SOLN
50.0000 mL | Freq: Once | INTRAMUSCULAR | Status: AC | PRN
  Administered 2021-06-25: 50 mL

## 2021-06-25 MED ORDER — MIDAZOLAM HCL 2 MG/2ML IJ SOLN
INTRAMUSCULAR | Status: AC | PRN
  Administered 2021-06-25 (×3): 0.5 mg via INTRAVENOUS
  Administered 2021-06-25: 1 mg via INTRAVENOUS

## 2021-06-25 MED ORDER — FENTANYL CITRATE (PF) 100 MCG/2ML IJ SOLN
INTRAMUSCULAR | Status: AC | PRN
  Administered 2021-06-25 (×5): 25 ug via INTRAVENOUS

## 2021-06-25 MED ORDER — LIDOCAINE HCL 1 % IJ SOLN
INTRAMUSCULAR | Status: AC
  Filled 2021-06-25: qty 20

## 2021-06-25 MED ORDER — LIDOCAINE HCL (PF) 1 % IJ SOLN
INTRAMUSCULAR | Status: AC | PRN
Start: 2021-06-25 — End: 2021-06-25
  Administered 2021-06-25: 8 mL

## 2021-06-25 MED ORDER — SODIUM CHLORIDE 0.9 % IV SOLN
2.0000 g | Freq: Once | INTRAVENOUS | Status: AC
  Administered 2021-06-25: 2 g via INTRAVENOUS
  Filled 2021-06-25: qty 2

## 2021-06-25 MED ORDER — NORMAL SALINE FLUSH 0.9 % IV SOLN: 10.0000 mL | Freq: Every day | INTRAVENOUS | 1 refills | Status: DC

## 2021-06-25 MED ORDER — MIDAZOLAM HCL 2 MG/2ML IJ SOLN
INTRAMUSCULAR | Status: AC
  Filled 2021-06-25: qty 2

## 2021-06-25 MED ORDER — FENTANYL CITRATE (PF) 100 MCG/2ML IJ SOLN
INTRAMUSCULAR | Status: AC
  Filled 2021-06-25: qty 2

## 2021-06-25 MED ORDER — SODIUM CHLORIDE 0.9% FLUSH: 5.0000 mL | Freq: Three times a day (TID) | INTRAVENOUS | Status: DC

## 2021-06-25 MED ORDER — SODIUM CHLORIDE 0.9 % IV SOLN: INTRAVENOUS | Status: DC
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
## 2021-06-25 NOTE — Procedures (Signed)
Interventional Radiology Procedure Note  Procedure:  1) Cholecystogram 2) Percutaneous gallstone retrieval 3) Antegrade cystic and common bile duct balloon sweep 4) Cholangiogram 5) Exchange of cholecystostomy tube  Findings: Please refer to procedural dictation for full description. Small residual stone burden, some with small blood clots.  Multiple stones removed.  Cystic duct became obstructed after retrieval, so antegrade balloon sweep was performed with reestablished patency.  Replaced 14 Fr cholecystostomy tube to bag drainage.  Complications: None immediate   Estimated Blood Loss: < 5 mL  Recommendations: Keep tube to bag drainage. Follow up in 1-2 weeks for tube check with Dr. Dwaine Gale or myself (IR clinic, [facility], or [facility] hospital as outpatient).   Ruthann Cancer, MD Pager: [phone]

## 2021-06-26 DIAGNOSIS — E782 Mixed hyperlipidemia: Secondary | ICD-10-CM | POA: Diagnosis not present

## 2021-06-26 DIAGNOSIS — J9611 Chronic respiratory failure with hypoxia: Secondary | ICD-10-CM | POA: Diagnosis not present

## 2021-06-26 DIAGNOSIS — I5042 Chronic combined systolic (congestive) and diastolic (congestive) heart failure: Secondary | ICD-10-CM | POA: Diagnosis not present

## 2021-06-26 DIAGNOSIS — E1165 Type 2 diabetes mellitus with hyperglycemia: Secondary | ICD-10-CM | POA: Diagnosis not present

## 2021-06-26 DIAGNOSIS — M21371 Foot drop, right foot: Secondary | ICD-10-CM | POA: Diagnosis not present

## 2021-06-26 DIAGNOSIS — J9621 Acute and chronic respiratory failure with hypoxia: Secondary | ICD-10-CM | POA: Diagnosis not present

## 2021-06-26 DIAGNOSIS — I10 Essential (primary) hypertension: Secondary | ICD-10-CM | POA: Diagnosis not present

## 2021-06-26 DIAGNOSIS — K810 Acute cholecystitis: Secondary | ICD-10-CM | POA: Diagnosis not present

## 2021-06-26 DIAGNOSIS — R809 Proteinuria, unspecified: Secondary | ICD-10-CM | POA: Diagnosis not present

## 2021-06-27 ENCOUNTER — Other Ambulatory Visit (HOSPITAL_COMMUNITY): Payer: 59

## 2021-06-28 ENCOUNTER — Encounter (HOSPITAL_COMMUNITY): Payer: Self-pay

## 2021-06-28 ENCOUNTER — Other Ambulatory Visit (HOSPITAL_COMMUNITY): Payer: Self-pay | Admitting: Interventional Radiology

## 2021-06-28 DIAGNOSIS — K8020 Calculus of gallbladder without cholecystitis without obstruction: Secondary | ICD-10-CM

## 2021-06-28 HISTORY — PX: IR EXCHANGE BILIARY DRAIN: IMG6046

## 2021-07-05 ENCOUNTER — Ambulatory Visit
Admission: RE | Admit: 2021-07-05 | Discharge: 2021-07-05 | Disposition: A | Discharge status: Home / Self Care | Payer: HMO | Source: Ambulatory Visit | Attending: Interventional Radiology | Admitting: Interventional Radiology

## 2021-07-05 ENCOUNTER — Other Ambulatory Visit: Payer: Self-pay | Admitting: Interventional Radiology

## 2021-07-05 DIAGNOSIS — K810 Acute cholecystitis: Secondary | ICD-10-CM

## 2021-07-05 DIAGNOSIS — K8000 Calculus of gallbladder with acute cholecystitis without obstruction: Secondary | ICD-10-CM | POA: Diagnosis not present

## 2021-07-05 DIAGNOSIS — Z434 Encounter for attention to other artificial openings of digestive tract: Secondary | ICD-10-CM | POA: Diagnosis not present

## 2021-07-05 HISTORY — PX: IR RADIOLOGIST EVAL & MGMT: IMG5224
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
## 2021-07-07 DIAGNOSIS — J449 Chronic obstructive pulmonary disease, unspecified: Secondary | ICD-10-CM | POA: Diagnosis not present

## 2021-07-07 DIAGNOSIS — J9611 Chronic respiratory failure with hypoxia: Secondary | ICD-10-CM | POA: Diagnosis not present

## 2021-07-07 DIAGNOSIS — I5040 Unspecified combined systolic (congestive) and diastolic (congestive) heart failure: Secondary | ICD-10-CM | POA: Diagnosis not present

## 2021-07-09 DIAGNOSIS — R778 Other specified abnormalities of plasma proteins: Secondary | ICD-10-CM | POA: Insufficient documentation
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
## 2021-07-11 ENCOUNTER — Encounter: Payer: Self-pay | Admitting: Cardiology

## 2021-07-11 ENCOUNTER — Ambulatory Visit (INDEPENDENT_AMBULATORY_CARE_PROVIDER_SITE_OTHER): Payer: HMO | Admitting: Cardiology

## 2021-07-11 ENCOUNTER — Other Ambulatory Visit: Payer: Self-pay

## 2021-07-11 VITALS — BP 110/70 | HR 84 | Ht 70.0 in | Wt 188.0 lb

## 2021-07-11 DIAGNOSIS — I10 Essential (primary) hypertension: Secondary | ICD-10-CM

## 2021-07-11 DIAGNOSIS — J9611 Chronic respiratory failure with hypoxia: Secondary | ICD-10-CM

## 2021-07-11 DIAGNOSIS — R778 Other specified abnormalities of plasma proteins: Secondary | ICD-10-CM

## 2021-07-11 NOTE — Patient Instructions (Signed)
Medication Instructions:  The current medical regimen is effective;  continue present plan and medications.  *If you need a refill on your cardiac medications before your next appointment, please call your pharmacy*  Follow-Up: At [facility], you and your health needs are our priority.  As part of our continuing mission to provide you with exceptional heart care, we have created designated Provider Care Teams.  These Care Teams include your primary Cardiologist (physician) and Advanced Practice Providers (APPs -  Physician Assistants and Nurse Practitioners) who all work together to provide you with the care you need, when you need it.  We recommend signing up for the patient portal called "MyChart".  Sign up information is provided on this After Visit Summary.  MyChart is used to connect with patients for Virtual Visits (Telemedicine).  Patients are able to view lab/test results, encounter notes, upcoming appointments, etc.  Non-urgent messages can be sent to your provider as well.   To learn more about what you can do with MyChart, go to [email or web address].    Your next appointment:   1 year(s)  The format for your next appointment:   In Person  Provider:   James Hochrein, MD   Thank you for choosing [facility]!!    

## 2021-07-17 ENCOUNTER — Other Ambulatory Visit: Payer: Self-pay

## 2021-07-17 ENCOUNTER — Ambulatory Visit (HOSPITAL_COMMUNITY): Payer: HMO | Attending: Internal Medicine | Admitting: Physical Therapy

## 2021-07-17 DIAGNOSIS — M25471 Effusion, right ankle: Secondary | ICD-10-CM | POA: Diagnosis not present

## 2021-07-17 DIAGNOSIS — M6281 Muscle weakness (generalized): Secondary | ICD-10-CM | POA: Diagnosis not present
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
## 2021-07-19 ENCOUNTER — Ambulatory Visit
Admission: RE | Admit: 2021-07-19 | Discharge: 2021-07-19 | Disposition: A | Discharge status: Home / Self Care | Payer: HMO | Source: Ambulatory Visit | Attending: Interventional Radiology | Admitting: Interventional Radiology

## 2021-07-19 ENCOUNTER — Other Ambulatory Visit (HOSPITAL_COMMUNITY): Payer: Self-pay

## 2021-07-19 ENCOUNTER — Other Ambulatory Visit (HOSPITAL_COMMUNITY): Payer: Self-pay | Admitting: Acute Care

## 2021-07-19 DIAGNOSIS — Z434 Encounter for attention to other artificial openings of digestive tract: Secondary | ICD-10-CM | POA: Diagnosis not present

## 2021-07-19 DIAGNOSIS — K810 Acute cholecystitis: Secondary | ICD-10-CM

## 2021-07-19 HISTORY — PX: IR RADIOLOGIST EVAL & MGMT: IMG5224

## 2021-07-19 MED ORDER — NORMAL SALINE FLUSH 0.9 % IV SOLN
INTRAVENOUS | 0 refills | Status: DC
  Filled 2021-07-19: qty 300, 30d supply, fill #0
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
## 2021-07-24 ENCOUNTER — Other Ambulatory Visit: Payer: Self-pay

## 2021-07-24 ENCOUNTER — Ambulatory Visit (HOSPITAL_COMMUNITY): Payer: HMO

## 2021-07-24 ENCOUNTER — Encounter (HOSPITAL_COMMUNITY): Payer: Self-pay

## 2021-07-24 DIAGNOSIS — M6281 Muscle weakness (generalized): Secondary | ICD-10-CM

## 2021-07-24 DIAGNOSIS — M25471 Effusion, right ankle: Secondary | ICD-10-CM | POA: Diagnosis not present

## 2021-07-24 NOTE — Patient Instructions (Addendum)
Inversion: Resisted   Cross legs with right leg underneath, foot in tubing loop. Hold tubing around other foot to resist and turn foot in. Repeat 10 times per set. Do 2 sets per day.  http://orth.exer.us/12   Copyright  VHI. All rights reserved.  Eversion: Resisted   With right foot in tubing loop, hold tubing around other foot to resist and turn foot out. Repeat 10 times per set. Do 2 sets per day.  http://orth.exer.us/14   Copyright  VHI. All rights reserved.  Plantar Flexion: Resisted   Anchor behind, tubing around left foot, press down. Repeat 10 times per set. Do 2 sets per day.  http://orth.exer.us/10   Copyright  VHI. All rights reserved.  Dorsiflexion: Resisted   Facing anchor, tubing around left foot, pull toward face.  Repeat 10 times per set. Do 2 sets per day.  http://orth.exer.us/8   Copyright  VHI. All rights reserved.   Calf Stretch    With towel around forefoot, keep knee straight and pull back on towel until a stretch is felt in the calf. Hold 30 seconds. Repeat 3 times. Do 2 sessions per day.  Copyright  VHI. All rights reserved.   Gastroc, Standing    Stand, right foot behind, heel on floor and turned slightly out, leg straight, forward leg bent. Keeping arms straight, push pelvis forward until stretch is felt in calf. Hold 30 seconds. Repeat 3 times per session. Do 2 sessions per day.  Copyright  VHI. All rights reserved.
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
## 2021-07-26 ENCOUNTER — Encounter (HOSPITAL_COMMUNITY): Payer: Self-pay | Admitting: Physical Therapy

## 2021-07-26 ENCOUNTER — Ambulatory Visit (HOSPITAL_COMMUNITY): Payer: HMO | Admitting: Physical Therapy

## 2021-07-26 ENCOUNTER — Other Ambulatory Visit: Payer: Self-pay

## 2021-07-26 DIAGNOSIS — M25471 Effusion, right ankle: Secondary | ICD-10-CM

## 2021-07-26 DIAGNOSIS — M6281 Muscle weakness (generalized): Secondary | ICD-10-CM

## 2021-07-26 NOTE — Patient Instructions (Signed)
Access Code: [email or web address] URL: [email or web address] Date: 07/26/2021 Prepared by: Mitzi Hansen Ewel Lona  Exercises Standing Tandem Balance with Counter Support - 1 x daily - 7 x weekly - 3 reps - 30 second hold
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
## 2021-07-31 ENCOUNTER — Encounter: Payer: Self-pay | Admitting: General Surgery

## 2021-07-31 ENCOUNTER — Ambulatory Visit: Payer: HMO | Admitting: General Surgery

## 2021-07-31 ENCOUNTER — Encounter (HOSPITAL_COMMUNITY): Payer: Self-pay | Admitting: Physical Therapy

## 2021-07-31 ENCOUNTER — Ambulatory Visit (HOSPITAL_COMMUNITY): Payer: HMO | Admitting: Physical Therapy

## 2021-07-31 ENCOUNTER — Other Ambulatory Visit: Payer: Self-pay

## 2021-07-31 VITALS — BP 102/76 | HR 98 | Temp 98.8°F | Resp 18 | Ht 70.0 in | Wt 191.0 lb

## 2021-07-31 DIAGNOSIS — M6281 Muscle weakness (generalized): Secondary | ICD-10-CM

## 2021-07-31 DIAGNOSIS — M25471 Effusion, right ankle: Secondary | ICD-10-CM | POA: Diagnosis not present

## 2021-07-31 DIAGNOSIS — K8020 Calculus of gallbladder without cholecystitis without obstruction: Secondary | ICD-10-CM | POA: Diagnosis not present
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
## 2021-08-01 ENCOUNTER — Ambulatory Visit: Payer: HMO | Admitting: Internal Medicine

## 2021-08-01 ENCOUNTER — Other Ambulatory Visit: Payer: Self-pay

## 2021-08-01 ENCOUNTER — Encounter: Payer: Self-pay | Admitting: Internal Medicine

## 2021-08-01 DIAGNOSIS — I82403 Acute embolism and thrombosis of unspecified deep veins of lower extremity, bilateral: Secondary | ICD-10-CM

## 2021-08-01 DIAGNOSIS — U071 COVID-19: Secondary | ICD-10-CM | POA: Diagnosis not present

## 2021-08-01 DIAGNOSIS — R911 Solitary pulmonary nodule: Secondary | ICD-10-CM | POA: Diagnosis not present

## 2021-08-01 NOTE — Progress Notes (Signed)
[place], male    DOB: December 27, 1950    MRN: [id number]   Brief patient profile:  70 yowm never smoker with DM then covid Jul 28 2020   Admit date: 08/19/2020 Discharge date: 09/28/2020   Recommendations for Outpatient Follow-up:  Follow-up urology in 2 weeks for voiding trial Patient will be discharged with Foley catheter Continue oxygen 2 to 3 L/min via nasal cannula, wean off as tolerated Follow-up with ophthalmology as outpatient   Discharge Diagnoses:  Active Problems:   DM2 (diabetes mellitus, type 2) (HCC)   Benign essential HTN   Dyslipidemia   Chest tube in place   COVID-19 virus infection   Acute hypoxemic respiratory failure due to COVID-19 [facility])   Pneumothorax, left   Pneumonia due to COVID-19 virus   Leg DVT (deep venous thromboembolism), acute, bilateral (HCC)   Pressure injury of skin   Acute respiratory failure with hypoxia (HCC)   HCAP (healthcare-associated pneumonia)   Primary spontaneous pneumothorax   Protein-calorie malnutrition, severe   Adjustment disorder with anxious mood      History of present illness:  70 year old male with recent Covid pneumonia, 08/08/2020 -  03/10/8118, complicated by bilateral DVTs was admitted 08/19/2020 with left pneumothorax requiring chest tube placement and subsequent intubation 08/20/2020.  He was extubated following day but had persistent bilateral infiltrates requiring high flow nasal cannula 10 to 15 L/min oxygen.  Hospital course complicated by urinary retention, Foley catheter was inserted.  Also had episode of sudden loss of vision in right eye which has now resolved.  Neurology work-up was negative.   Hospital Course:  Acute hypoxemic and hypercarbic respiratory failure-secondary to recent COVID-19 pneumonia, s/p left pneumothorax.  Wean down to 2 L nasal cannula oxygen, continue to wean off as tolerated.  Continue to wean prednisone.  Will discharge on prednisone taper for 5 more days.  Continue as needed  albuterol inhaler. Acute urine retention-started on tamsulosin, Foley catheter.  Failed voiding trial x2.    Urology recommends to discharge patient with Foley catheter. Urology will see patient as outpatient. Bilateral DVTs- continue Eliquis. Diabetes mellitus type 2-continue Lantus, sliding scale insulin with NovoLog, linagliptin.  CBG well controlled. Depression/anxiety-continue Ativan as needed Sudden unilateral vision loss-resolved, patient had episode of sudden loss of vision right eye on 09/07/2020, he was unable to see anything for few seconds only, later on his vision came back but it remained blurry for 15 to 20 minutes.  CT head without contrast was negative.  MRI was unremarkable.  Bilateral carotid duplex was unremarkable.  Discussed with patient and his wife, he needs to follow-up with ophthalmology as outpatient. Left eye subconjunctival hemorrhage-stable       History of Present Illness  02/09/2021  Pulmonary/ 1st office eval/[facility]  Chief Complaint  Patient presents with   Consult    Shortness of breath with a lot of exertion  Dyspnea:  Walks x 10 laps = maybe 6-8 min at slow pace at building / walks s 02 with  sats 86% Baseline faster walk s 02  Cough: some pnds mucus is clear Sleep: has sleep number bed but still in incliner with 30 degrees fine  SABA use: not helping 02  1.5 hs conc and prn daytime but not titrating  rec Continue pantoprazole 40 mg Take 30-60 min before first meal of the day and add pepcid 20 mg after supper  GERD No need for inhalers  Make sure you check your oxygen saturation  at your highest level  of activity  to be sure it stays over 90% and adjust  02 flow upward to maintain this level if needed but remember to turn it back to previous settings when you stop (to conserve your supply).  Please schedule a follow up office visit in 6 weeks, call sooner if needed with PFT's on return      03/23/2021  f/u ov/[place] office/Declynn Lopresti re: Covid  pneumonia, 08/08/2020 -  0/76/8088, complicated by bilateral DVTs / 02 desp resp failure  Chief Complaint  Patient presents with   Follow-up    Intermittent productive cough with clear phlegm   Dyspnea:  Walking up to an hour but stops because 02 drops 86% on 3lpm  Cough:  not a big problem / assoc with pnds sensation but no excess pnds/ sleep disturbance  Sleeping: no problem sleeping in recliner 30 degrees  SABA use: none  02: 3lpm 24/7  Covid status: 2nd shot feb 2022 pfizer  Rec Stop pantoprazole and change pepcid 20 mg after breakfast and supper x 1 week then just take one after supper x one week then stop  - if  flare go back one stop      08/01/2021  f/u ov/[place] office/Jb [place]worth re:  Covid pneumonia, 08/08/2020 -  12/11/3157, complicated by bilateral DVTs / 02 desp resp failure / SPN Chief Complaint  Patient presents with   Follow-up    2L O2 when moving around. When sitting none is needed. Occasionally coughing up clear mucus.   Dyspnea:  walking an hour flat surface on 2 - 3lpm s stopping sats ok on 02 Cough: some pnds  Sleeping: recliner 30 degrees  SABA use: none  02: up to 3lpm with ex / ok at rest s 02/ / 2lpmhs  Covid status: vax x 2 / delta infection Needs surgical clearance for possible cholecystectomy, still has cholecystostomy drain but pain when clamps and surgery may be needed though nothing immediate needed  per Dr Arnoldo Morale pt has no stones to remove.   No obvious day to day or daytime variability or assoc excess/ purulent sputum or mucus plugs or hemoptysis or cp or chest tightness, subjective wheeze or overt sinus or hb symptoms.   Sleeping  without nocturnal  or early am exacerbation  of respiratory  c/o's or need for noct saba. Also denies any obvious fluctuation of symptoms with weather or environmental changes or other aggravating or alleviating factors except as outlined above   No unusual exposure hx or h/o childhood pna/ asthma or knowledge of premature  birth.  Current Allergies, Complete Past Medical History, Past Surgical History, Family History, and Social History were reviewed in [employer] record.  ROS  The following are not active complaints unless bolded Hoarseness, sore throat, dysphagia, dental problems, itching, sneezing,  nasal congestion or discharge of excess mucus or purulent secretions, ear ache,   fever, chills, sweats, unintended wt loss or wt gain, classically pleuritic or exertional cp,  orthopnea pnd or arm/hand swelling  or leg swelling, presyncope, palpitations, abdominal pain, anorexia, nausea, vomiting, diarrhea  or change in bowel habits or change in bladder habits, change in stools or change in urine, dysuria, hematuria,  rash, arthralgias, visual complaints, headache, numbness, weakness or ataxia or problems with walking or coordination,  change in mood or  memory.        Current Meds  Medication Sig   ACCU-CHEK GUIDE test strip USE TO TEST TWICE DAILY.D   acetaminophen (TYLENOL) 500 MG tablet Take 1,000  mg by mouth 2 (two) times daily as needed for moderate pain.   alclomethasone (ACLOVATE) 0.05 % cream Apply topically 2 (two) times daily as needed (Rash).   ALPRAZolam (XANAX) 0.25 MG tablet Take 0.25 mg by mouth daily as needed for anxiety.   alum & mag hydroxide-simeth (MAALOX/MYLANTA) 200-200-20 MG/5ML suspension Take 30 mLs by mouth every 4 (four) hours as needed for indigestion.   ascorbic acid (VITAMIN C) 500 MG tablet Take 1 tablet (500 mg total) by mouth daily.   aspirin EC 81 MG tablet Take 81 mg by mouth daily. Swallow whole.   blood glucose meter kit and supplies KIT Dispense based on patient and insurance preference. Use up to four times daily as directed. (FOR ICD-9 250.00, 250.01).   Cholecalciferol (VITAMIN D3) 125 MCG (5000 UT) CAPS Take 5,000 Units by mouth daily.   co-enzyme Q-10 50 MG capsule Take 50 mg by mouth 2 (two) times daily.   famotidine (PEPCID) 20 MG tablet Take 20  mg by mouth daily after supper.   FIASP FLEXTOUCH 100 UNIT/ML FlexTouch Pen Inject 2-8 Units into the skin every evening.   furosemide (LASIX) 20 MG tablet Take 20 mg by mouth daily as needed for edema.   ibuprofen (ADVIL) 600 MG tablet Take 1 tablet (600 mg total) by mouth every 6 (six) hours as needed.   Insulin Pen Needle (PEN NEEDLES) 32G X 4 MM MISC 1 Package by Does not apply route 4 (four) times daily -  before meals and at bedtime.   JANUVIA 100 MG tablet Take 100 mg by mouth daily.   metFORMIN (GLUCOPHAGE) 1000 MG tablet Take 1,000 mg by mouth 2 (two) times daily with a meal.   Multiple Vitamin (MULTIVITAMIN) capsule Take 1 capsule by mouth daily.   olmesartan-hydrochlorothiazide (BENICAR HCT) 20-12.5 MG tablet Take 1 tablet by mouth daily.   pantoprazole (PROTONIX) 40 MG tablet Take 40 mg by mouth daily.   Phenylephrine HCl (SINEX REGULAR NA) Place 1 spray into the nose daily as needed (congestion).   rosuvastatin (CRESTOR) 5 MG tablet Take 5 mg by mouth daily.   Sodium Chloride Flush (NORMAL SALINE FLUSH) 0.9 % SOLN Give 10 mLs by tube daily.   Sodium Chloride Flush (NORMAL SALINE FLUSH) 0.9 % SOLN Flush once daily with 82m   TOUJEO MAX SOLOSTAR 300 UNIT/ML Solostar Pen Inject 30 Units into the skin in the morning.   zinc sulfate 220 (50 Zn) MG capsule Take 1 capsule (220 mg total) by mouth daily.   zolpidem (AMBIEN) 10 MG tablet Take 10 mg by mouth at bedtime.                   Past Medical History:  Diagnosis Date   Acute respiratory disease    Atypical mole 12/30/2012   severe left post shoulder tx exc   Candidiasis of urogenital sites    Diabetes mellitus    Diffuse myofascial pain syndrome    Esophageal reflux    Hypertension    Impacted cerumen of right ear    [place]anoma ([place] 06/14/2011   left ear mohs   Mixed hyperlipidemia    MM (malignant melanoma of skin) ([place] 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   Primary insomnia    Seborrheic dermatitis, unspecified     Squamous cell carcinoma of skin 06/14/2011   left forearm medial cx3 54f  Thrombocytopenia, unspecified ([place]        Objective:       08/01/2021  189   03/23/21 201 lb (91.2 kg)  02/09/21 200 lb (90.7 kg)  12/27/20 197 lb (89.4 kg)      Vital signs reviewed  08/01/2021  - Note at rest 02 sats  97% on 2lpm    General appearance:    amb obese hoarse  wm nad          HEENT : pt wearing mask not removed for exam due to covid -19 concerns.    NECK :  without JVD/Nodes/TM/ nl carotid upstrokes bilaterally   LUNGS: no acc muscle use,  Nl contour chest with decreased bs and min insp crackles bases bilaterally without cough on insp or exp maneuvers   CV:  RRR  no s3 or murmur or increase in P2, and  R > L LE pitting edema not wearing hose  ABD:  soft and nontender with nl inspiratory excursion in the supine position. No bruits or organomegaly appreciated, bowel sounds nl  MS:  Nl gait/ ext warm without deformities, calf tenderness, cyanosis or clubbing No obvious joint restrictions   SKIN: warm and dry without lesions    NEURO:  alert, approp, nl sensorium with  no motor or cerebellar deficits apparent.         CTa 04/04/21 1. No evidence of pulmonary embolism. 2. Findings suggestive of mild to moderate severity interstitial edema. A superimposed infectious component cannot be excluded. 3. Stable posterior left upper lobe noncalcified lung nodule versus scarring.            Lab Results  Component Value Date   ESRSEDRATE 25 (H) 03/23/2021   ESRSEDRATE 53 (H) 09/11/2020         Assessment

## 2021-08-01 NOTE — Patient Instructions (Addendum)
To get the most out of exercise, you need to be continuously aware that you are short of breath, but never out of breath, for at least 30 minutes daily. As you improve, it will actually be easier for you to do the same amount of exercise  in  30 minutes so always push to the level where you are short of breath.  Once you can do this, push for longer duration or repeat it after at least 4 hours of rest.  Make sure you check your oxygen saturations at highest level of activity to be sure they are maintaining above 90%   You  are cleared for surgery but will need early mobilization and Incentvive spirometry every hour before and after and minimal pain medications   Please schedule a follow up visit in 2 months but call sooner if needed

## 2021-08-01 NOTE — Progress Notes (Signed)
Subjective:     Tim Day  Patient presents for follow-up of cholecystostomy tube.  He has been seeing IR for ongoing care.  A trial of capping the cholecystostomy tube was not successful.  He was referred back for possible cholecystectomy.  He denies any fever or chills.  He is on home O2 at 2 L due to pulmonary injury from [place].  He has seen Dr. Melvyn Novas of pulmonology tomorrow. Objective:    BP 102/76   Pulse 98   Temp 98.8 F (37.1 C) (Other (Comment))   Resp 18   Ht '5\' 10"'$  (1.778 m)   Wt 191 lb (86.6 kg)   SpO2 94%   BMI 27.41 kg/m   General:  alert, cooperative, and no distress  Head is normocephalic, atraumatic Lungs are clear to auscultation with distant heart sounds noted Heart examination reveals a regular rate and rhythm without S3, S4, murmurs Abdomen is soft, nontender, nondistended.  Cholecystostomy tube is present along the right flank.  IR notes reviewed     Assessment:    Status post cholecystostomy tube placement.  Recent evaluation shows no residual cholelithiasis or choledocholithiasis.  Patient failed a trial of capping the tube.  Patient appears to be still at higher risk for surgical intervention due to his pulmonary status.   Plan:   We will try to the tube again.  He is seeing Dr. Melvyn Novas and I asked him to have him assess his pulmonary status for possible elective cholecystectomy.  Further management is pending those results.

## 2021-08-02 ENCOUNTER — Encounter (HOSPITAL_COMMUNITY): Payer: Self-pay | Admitting: Physical Therapy

## 2021-08-02 ENCOUNTER — Encounter: Payer: Self-pay | Admitting: Internal Medicine

## 2021-08-02 ENCOUNTER — Ambulatory Visit (HOSPITAL_COMMUNITY): Payer: HMO | Attending: Internal Medicine | Admitting: Physical Therapy

## 2021-08-02 DIAGNOSIS — R911 Solitary pulmonary nodule: Secondary | ICD-10-CM | POA: Insufficient documentation

## 2021-08-02 DIAGNOSIS — M6281 Muscle weakness (generalized): Secondary | ICD-10-CM | POA: Diagnosis not present

## 2021-08-02 DIAGNOSIS — M25471 Effusion, right ankle: Secondary | ICD-10-CM | POA: Diagnosis not present

## 2021-08-02 NOTE — Assessment & Plan Note (Signed)
Never smoker - See CTa  04/04/21 1.5 cm x 1.1 cm noncalcified lung nodule LUL vs Oct 2021   Low risk but will need f/u in 3 months - placed in reminder file   Discussed in detail all the  indications, usual  risks and alternatives  relative to the benefits with patient who agrees to proceed with conservative f/u as outlined           Each maintenance medication was reviewed in detail including emphasizing most importantly the difference between maintenance and prns and under what circumstances the prns are to be triggered using an action plan format where appropriate.  Total time for H and P, chart review, counseling, reviewing 02 device(s) and generating customized AVS unique to this office visit / same day charting = 33 min

## 2021-08-02 NOTE — Assessment & Plan Note (Signed)
See venous dopplers 08/09/20 pos bilateral dvt with echo 08/26/20 neg for elevated R sided pressures  - repeat venous dopplers 03/29/2021 >>>  No evidence of acute or chronic DVT within either lower extremity.  No evidence of recurrence but asked to monitor for any fluid that does not resolve with elevation/ elastic hose as no longer on DOAC

## 2021-08-02 NOTE — Assessment & Plan Note (Signed)
Onset of illness 07/22/20 c/b bilateral dvt and L PTX / 02 dep at d/c 09/28/20  -  02/09/2021   Walked RA  approx   100 ft  @ slow pace  stopped due to  desats to 87% corrected to 90% on 1lpm  POC x another 100 ft then desat again   - PFT's  03/07/21   FEV1 1.26 (37 % ) ratio 0.92  p 4 % improvement from saba p 0 prior to study with DLCO  11.40 (46%) corrects to 3.09 (128%)  for alv volume and FV curve nl   - CTa 04/04/21 1. No evidence of pulmonary embolism. 2. Findings suggestive of mild to moderate severity interstitial edema. A superimposed infectious component cannot be excluded. 3. Stable posterior left upper lobe noncalcified lung nodule versus Scarring.  makng steady progress with ex tol but still 02 dep hs and with ex  He has restritive lung dz which is only a mild risk for gb surgery as he has good cough mechanics, main risk post op hypoxemia/ atx RLL  Discussed in detail all the  indications, usual  risks and alternatives  relative to the benefits with patient who agrees to proceed with Rx as outlined by IR/ gen sugery but I cleared him for surgery if needed as long as he does not backslide clinically in meantime.
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
## 2021-08-07 ENCOUNTER — Other Ambulatory Visit: Payer: Self-pay

## 2021-08-07 ENCOUNTER — Ambulatory Visit (HOSPITAL_COMMUNITY): Payer: HMO

## 2021-08-07 ENCOUNTER — Encounter (HOSPITAL_COMMUNITY): Payer: Self-pay

## 2021-08-07 DIAGNOSIS — J449 Chronic obstructive pulmonary disease, unspecified: Secondary | ICD-10-CM | POA: Diagnosis not present

## 2021-08-07 DIAGNOSIS — I5040 Unspecified combined systolic (congestive) and diastolic (congestive) heart failure: Secondary | ICD-10-CM | POA: Diagnosis not present

## 2021-08-07 DIAGNOSIS — M25471 Effusion, right ankle: Secondary | ICD-10-CM

## 2021-08-07 DIAGNOSIS — J9611 Chronic respiratory failure with hypoxia: Secondary | ICD-10-CM | POA: Diagnosis not present

## 2021-08-07 DIAGNOSIS — M6281 Muscle weakness (generalized): Secondary | ICD-10-CM
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
## 2021-08-08 ENCOUNTER — Encounter (HOSPITAL_COMMUNITY): Payer: HMO | Admitting: Physical Therapy

## 2021-08-09 ENCOUNTER — Encounter (HOSPITAL_COMMUNITY): Payer: Self-pay

## 2021-08-09 ENCOUNTER — Other Ambulatory Visit: Payer: Self-pay

## 2021-08-09 ENCOUNTER — Ambulatory Visit (HOSPITAL_COMMUNITY): Payer: HMO

## 2021-08-09 DIAGNOSIS — M25471 Effusion, right ankle: Secondary | ICD-10-CM

## 2021-08-09 DIAGNOSIS — M6281 Muscle weakness (generalized): Secondary | ICD-10-CM
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
## 2021-08-14 ENCOUNTER — Encounter (HOSPITAL_COMMUNITY): Payer: Self-pay | Admitting: Physical Therapy

## 2021-08-14 ENCOUNTER — Other Ambulatory Visit: Payer: Self-pay

## 2021-08-14 ENCOUNTER — Ambulatory Visit (HOSPITAL_COMMUNITY): Payer: HMO | Admitting: Physical Therapy

## 2021-08-14 DIAGNOSIS — M25471 Effusion, right ankle: Secondary | ICD-10-CM

## 2021-08-14 DIAGNOSIS — M6281 Muscle weakness (generalized): Secondary | ICD-10-CM
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
## 2021-08-16 ENCOUNTER — Ambulatory Visit (HOSPITAL_COMMUNITY): Payer: HMO

## 2021-08-16 ENCOUNTER — Encounter (HOSPITAL_COMMUNITY): Payer: Self-pay

## 2021-08-16 ENCOUNTER — Other Ambulatory Visit: Payer: Self-pay

## 2021-08-16 DIAGNOSIS — M25471 Effusion, right ankle: Secondary | ICD-10-CM | POA: Diagnosis not present

## 2021-08-16 DIAGNOSIS — M6281 Muscle weakness (generalized): Secondary | ICD-10-CM
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
## 2021-08-16 NOTE — Patient Instructions (Signed)
CHEST: Doorway, Bilateral - Standing    Standing in doorway, place hands on wall with elbows bent at shoulder height. Lean forward. Hold 30_ seconds. _2-3__ reps per set, 1__ sets per day. Can do with your arms at your side as well.  Copyright  VHI. All rights reserved.   Row: High - Standing    With yellow band anchored high, pull elbows backward, squeezing shoulder blades together. Keep head and spine neutral. Row 10-15___ times, 1_ times per day.  http://ss.exer.us/292   Copyright  VHI. All rights reserved.   Shoulder Retraction With Band    With band attached in front, pull arms back as if rowing a boat. Hold _2-3___ seconds. Repeat _10-15___ times. Do __1__ sessions per day.  Copyright  VHI. All rights reserved.   SHOULDER: Extension (Band)    Start with arm slightly forward. Holding band, pull backward, past hip, keeping elbow straight. Do not swing arm. Hold _2-3__ seconds. Use _green_ band. _10-15_ reps per set, 1_ sets per day.  Copyright  VHI. All rights reserved.

## 2021-08-17 ENCOUNTER — Other Ambulatory Visit: Payer: Self-pay | Admitting: Physician Assistant

## 2021-08-20 ENCOUNTER — Other Ambulatory Visit (HOSPITAL_COMMUNITY): Payer: Self-pay | Admitting: Student

## 2021-08-20 ENCOUNTER — Other Ambulatory Visit: Payer: Self-pay

## 2021-08-20 ENCOUNTER — Ambulatory Visit (HOSPITAL_COMMUNITY)
Admission: RE | Admit: 2021-08-20 | Discharge: 2021-08-20 | Disposition: A | Discharge status: Home / Self Care | Payer: HMO | Source: Ambulatory Visit | Attending: Student | Admitting: Student

## 2021-08-20 DIAGNOSIS — K810 Acute cholecystitis: Secondary | ICD-10-CM | POA: Diagnosis not present

## 2021-08-20 DIAGNOSIS — Z434 Encounter for attention to other artificial openings of digestive tract: Secondary | ICD-10-CM | POA: Insufficient documentation

## 2021-08-20 HISTORY — PX: IR EXCHANGE BILIARY DRAIN: IMG6046

## 2021-08-20 HISTORY — PX: IR SINUS/FIST TUBE CHK-NON GI: IMG673

## 2021-08-20 MED ORDER — IOHEXOL 240 MG/ML SOLN
50.0000 mL | Freq: Once | INTRAMUSCULAR | Status: AC
  Administered 2021-08-20: 8 mL

## 2021-08-20 NOTE — Procedures (Signed)
Interventional Radiology Procedure Note  Procedure: Cholecystogram  Findings: Please refer to procedural dictation for full description. Widely patent cystic and common bile ducts, non-dilated.  No cholelithiasis or choledocholithiasis.  Tube capped.  Complications: None  Estimated Blood Loss: None  Recommendations: Capping trial for 1-2 weeks.  Given instructions on red flags for termination of capping trial.  Return in 1-2 weeks for repeat cholecystogram and possible tube removal if capping trial passed.   Ruthann Cancer, MD Pager: [phone]

## 2021-08-21 ENCOUNTER — Encounter (HOSPITAL_COMMUNITY): Payer: Self-pay | Admitting: Physical Therapy

## 2021-08-21 ENCOUNTER — Ambulatory Visit (HOSPITAL_COMMUNITY): Payer: HMO | Admitting: Physical Therapy

## 2021-08-21 DIAGNOSIS — M25471 Effusion, right ankle: Secondary | ICD-10-CM | POA: Diagnosis not present

## 2021-08-21 DIAGNOSIS — M6281 Muscle weakness (generalized): Secondary | ICD-10-CM
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
## 2021-08-23 ENCOUNTER — Encounter (HOSPITAL_COMMUNITY): Payer: HMO | Admitting: Physical Therapy

## 2021-08-28 ENCOUNTER — Encounter (HOSPITAL_COMMUNITY): Payer: HMO | Admitting: Physical Therapy

## 2021-08-30 ENCOUNTER — Encounter (HOSPITAL_COMMUNITY): Payer: HMO | Admitting: Physical Therapy

## 2021-08-30 ENCOUNTER — Other Ambulatory Visit: Payer: Self-pay

## 2021-08-30 ENCOUNTER — Ambulatory Visit (HOSPITAL_COMMUNITY)
Admission: RE | Admit: 2021-08-30 | Discharge: 2021-08-30 | Disposition: A | Discharge status: Home / Self Care | Payer: HMO | Source: Ambulatory Visit | Attending: Student | Admitting: Student

## 2021-08-30 ENCOUNTER — Other Ambulatory Visit (HOSPITAL_COMMUNITY): Payer: Self-pay | Admitting: Student

## 2021-08-30 DIAGNOSIS — K810 Acute cholecystitis: Secondary | ICD-10-CM

## 2021-08-30 DIAGNOSIS — Z434 Encounter for attention to other artificial openings of digestive tract: Secondary | ICD-10-CM | POA: Insufficient documentation

## 2021-08-30 HISTORY — PX: IR CHOLANGIOGRAM EXISTING TUBE: IMG6040

## 2021-08-30 MED ORDER — IOHEXOL 240 MG/ML SOLN
50.0000 mL | Freq: Once | INTRAMUSCULAR | Status: AC | PRN
  Administered 2021-08-30: 5 mL via INTRAVENOUS

## 2021-08-30 NOTE — Procedures (Signed)
Interventional Radiology Procedure:   Indications: History of cholecystostomy and retrieval of gallstones.  Completed capping trial without complications.  Procedure: Cholangiogram through cholecystostomy tube  Findings: Cystic and CBD are patent with rapid drainage into duodenum.  Drain was removed.   Complications: No immediate complications noted.     EBL: Minimal  Plan: Dressing changes as needed.    Asucena Galer R. Anselm Pancoast, MD  Pager: [phone]

## 2021-08-31 ENCOUNTER — Other Ambulatory Visit (HOSPITAL_COMMUNITY): Payer: HMO

## 2021-09-10 ENCOUNTER — Encounter (HOSPITAL_COMMUNITY): Payer: Self-pay | Admitting: Radiology

## 2021-09-10 ENCOUNTER — Other Ambulatory Visit (HOSPITAL_COMMUNITY): Payer: Self-pay | Admitting: Student

## 2021-09-10 DIAGNOSIS — K810 Acute cholecystitis: Secondary | ICD-10-CM

## 2021-09-12 ENCOUNTER — Other Ambulatory Visit: Payer: Self-pay

## 2021-09-12 ENCOUNTER — Encounter: Payer: Self-pay | Admitting: Physician Assistant

## 2021-09-12 ENCOUNTER — Ambulatory Visit: Payer: HMO | Admitting: Physician Assistant

## 2021-09-12 DIAGNOSIS — B354 Tinea corporis: Secondary | ICD-10-CM

## 2021-09-12 DIAGNOSIS — Z85828 Personal history of other malignant neoplasm of skin: Secondary | ICD-10-CM | POA: Diagnosis not present

## 2021-09-12 DIAGNOSIS — R21 Rash and other nonspecific skin eruption: Secondary | ICD-10-CM

## 2021-09-12 DIAGNOSIS — Z86018 Personal history of other benign neoplasm: Secondary | ICD-10-CM

## 2021-09-12 DIAGNOSIS — Z1283 Encounter for screening for malignant neoplasm of skin: Secondary | ICD-10-CM

## 2021-09-12 DIAGNOSIS — B359 Dermatophytosis, unspecified: Secondary | ICD-10-CM

## 2021-09-12 DIAGNOSIS — Z85820 Personal history of malignant melanoma of skin: Secondary | ICD-10-CM

## 2021-09-12 MED ORDER — KETOCONAZOLE 2 % EX CREA: 1.0000 "application " | TOPICAL_CREAM | Freq: Two times a day (BID) | CUTANEOUS | 10 refills | Status: AC

## 2021-09-12 MED ORDER — TRIAMCINOLONE ACETONIDE 0.1 % EX CREA: TOPICAL_CREAM | CUTANEOUS | 3 refills | Status: DC

## 2021-09-14 ENCOUNTER — Encounter: Payer: Self-pay | Admitting: Physician Assistant

## 2021-09-14 NOTE — Progress Notes (Signed)
   Follow-Up Visit   Subjective  Tim Day is a 70 y.o. male who presents for the following: Annual Exam (Scaly- flaky and itch & between eye- flaky. Personal history of melanoma, atypical nevi & non mole skin cancers. ).   The following portions of the chart were reviewed this encounter and updated as appropriate:  Tobacco  Allergies  Meds  Problems  Med Hx  Surg Hx  Fam Hx      Objective  Well appearing patient in no apparent distress; mood and affect are within normal limits.  A full examination was performed including scalp, head, eyes, ears, nose, lips, neck, chest, axillae, abdomen, back, buttocks, bilateral upper extremities, bilateral lower extremities, hands, feet, fingers, toes, fingernails, and toenails. All findings within normal limits unless otherwise noted below.  Right Abdomen (side) - Upper Thick erythematous scale after bandaging reaction.     Left Anterior Neck, Mid Root of Nose Oval crusts with pink scale and central clearing.     Assessment & Plan  Rash and other nonspecific skin eruption Right Abdomen (side) - Upper  triamcinolone cream (KENALOG) 0.1 % - Right Abdomen (side) - Upper Apply to abdomen qd- bid  Tinea corporis (2) Mid Root of Nose; Left Anterior Neck  ketoconazole (NIZORAL) 2 % cream - Left Anterior Neck, Mid Root of Nose Apply 1 application topically in the morning and at bedtime.  No atypical nevi noted at the time of the visit. MM scars are clear. No lymphadenopathy.   I, Hajar Penninger, PA-C, have reviewed all documentation's for this visit.  The documentation on 09/14/21 for the exam, diagnosis, procedures and orders are all accurate and complete.

## 2021-09-27 DIAGNOSIS — E782 Mixed hyperlipidemia: Secondary | ICD-10-CM | POA: Diagnosis not present

## 2021-09-27 DIAGNOSIS — E1165 Type 2 diabetes mellitus with hyperglycemia: Secondary | ICD-10-CM | POA: Diagnosis not present

## 2021-10-02 ENCOUNTER — Ambulatory Visit (HOSPITAL_COMMUNITY)
Admission: RE | Admit: 2021-10-02 | Discharge: 2021-10-02 | Disposition: A | Discharge status: Home / Self Care | Payer: HMO | Source: Ambulatory Visit | Attending: Internal Medicine | Admitting: Internal Medicine

## 2021-10-02 ENCOUNTER — Other Ambulatory Visit: Payer: Self-pay | Admitting: Internal Medicine

## 2021-10-02 ENCOUNTER — Ambulatory Visit: Payer: HMO | Admitting: Internal Medicine

## 2021-10-02 ENCOUNTER — Encounter: Payer: Self-pay | Admitting: Internal Medicine

## 2021-10-02 ENCOUNTER — Other Ambulatory Visit: Payer: Self-pay

## 2021-10-02 DIAGNOSIS — I82403 Acute embolism and thrombosis of unspecified deep veins of lower extremity, bilateral: Secondary | ICD-10-CM

## 2021-10-02 DIAGNOSIS — J9690 Respiratory failure, unspecified, unspecified whether with hypoxia or hypercapnia: Secondary | ICD-10-CM | POA: Diagnosis not present

## 2021-10-02 DIAGNOSIS — J9611 Chronic respiratory failure with hypoxia: Secondary | ICD-10-CM | POA: Diagnosis not present

## 2021-10-02 DIAGNOSIS — U071 COVID-19: Secondary | ICD-10-CM | POA: Diagnosis not present

## 2021-10-02 DIAGNOSIS — R0902 Hypoxemia: Secondary | ICD-10-CM | POA: Diagnosis not present

## 2021-10-02 NOTE — Progress Notes (Signed)
[place], male    DOB: 10/01/1951    MRN: [id number]   Brief patient profile:  66 yowm never smoker with DM then covid Jul 28 2020   Admit date: 08/19/2020 Discharge date: 09/28/2020   Recommendations for Outpatient Follow-up:  Follow-up urology in 2 weeks for voiding trial Patient will be discharged with Foley catheter Continue oxygen 2 to 3 L/min via nasal cannula, wean off as tolerated Follow-up with ophthalmology as outpatient   Discharge Diagnoses:  Active Problems:   DM2 (diabetes mellitus, type 2) (HCC)   Benign essential HTN   Dyslipidemia   Chest tube in place   COVID-19 virus infection   Acute hypoxemic respiratory failure due to COVID-19 [facility])   Pneumothorax, left   Pneumonia due to COVID-19 virus   Leg DVT (deep venous thromboembolism), acute, bilateral (HCC)   Pressure injury of skin   Acute respiratory failure with hypoxia (HCC)   HCAP (healthcare-associated pneumonia)   Primary spontaneous pneumothorax   Protein-calorie malnutrition, severe   Adjustment disorder with anxious mood      History of present illness:  70 year old male with recent Covid pneumonia, 08/08/2020 -  0/25/8527, complicated by bilateral DVTs was admitted 08/19/2020 with left pneumothorax requiring chest tube placement and subsequent intubation 08/20/2020.  He was extubated following day but had persistent bilateral infiltrates requiring high flow nasal cannula 10 to 15 L/min oxygen.  Hospital course complicated by urinary retention, Foley catheter was inserted.  Also had episode of sudden loss of vision in right eye which has now resolved.  Neurology work-up was negative.   Hospital Course:  Acute hypoxemic and hypercarbic respiratory failure-secondary to recent COVID-19 pneumonia, s/p left pneumothorax.  Wean down to 2 L nasal cannula oxygen, continue to wean off as tolerated.  Continue to wean prednisone.  Will discharge on prednisone taper for 5 more days.  Continue as needed  albuterol inhaler. Acute urine retention-started on tamsulosin, Foley catheter.  Failed voiding trial x2.    Urology recommends to discharge patient with Foley catheter. Urology will see patient as outpatient. Bilateral DVTs- continue Eliquis. Diabetes mellitus type 2-continue Lantus, sliding scale insulin with NovoLog, linagliptin.  CBG well controlled. Depression/anxiety-continue Ativan as needed Sudden unilateral vision loss-resolved, patient had episode of sudden loss of vision right eye on 09/07/2020, he was unable to see anything for few seconds only, later on his vision came back but it remained blurry for 15 to 20 minutes.  CT head without contrast was negative.  MRI was unremarkable.  Bilateral carotid duplex was unremarkable.  Discussed with patient and his wife, he needs to follow-up with ophthalmology as outpatient. Left eye subconjunctival hemorrhage-stable       History of Present Illness  02/09/2021  Pulmonary/ 1st office eval/Rovena Hearld  Chief Complaint  Patient presents with   Consult    Shortness of breath with a lot of exertion  Dyspnea:  Walks x 10 laps = maybe 6-8 min at slow pace at building / walks s 02 with  sats 86% Baseline faster walk s 02  Cough: some pnds mucus is clear Sleep: has sleep number bed but still in incliner with 30 degrees fine  SABA use: not helping 02  1.5 hs conc and prn daytime but not titrating  rec Continue pantoprazole 40 mg Take 30-60 min before first meal of the day and add pepcid 20 mg after supper  GERD No need for inhalers  Make sure you check your oxygen saturation  at your highest level of  activity  to be sure it stays over 90% and adjust  02 flow upward to maintain this level if needed but remember to turn it back to previous settings when you stop (to conserve your supply).  Please schedule a follow up office visit in 6 weeks, call sooner if needed with PFT's on return      03/23/2021  f/u ov/[place] office/Laurena Valko re: Covid  pneumonia, 08/08/2020 -  5/64/3329, complicated by bilateral DVTs / 02 desp resp failure  Chief Complaint  Patient presents with   Follow-up    Intermittent productive cough with clear phlegm   Dyspnea:  Walking up to an hour but stops because 02 drops 86% on 3lpm  Cough:  not a big problem / assoc with pnds sensation but no excess pnds/ sleep disturbance  Sleeping: no problem sleeping in recliner 30 degrees  SABA use: none  02: 3lpm 24/7  Covid status: 2nd shot feb 2022 pfizer  Rec Stop pantoprazole and change pepcid 20 mg after breakfast and supper x 1 week then just take one after supper x one week then stop  - if  flare go back one stop      08/01/2021  f/u ov/[place] office/Aislee Landgren re:  Covid pneumonia, 08/08/2020 -  04/19/8415, complicated by bilateral DVTs / 02 desp resp failure / SPN Chief Complaint  Patient presents with   Follow-up    2L O2 when moving around. When sitting none is needed. Occasionally coughing up clear mucus.   Dyspnea:  walking an hour flat surface on 2 - 3lpm s stopping sats ok on 02 Cough: some pnds  Sleeping: recliner 30 degrees  SABA use: none  02: up to 3lpm with ex / ok at rest s 02/ / 2lpmhs  Covid status: vax x 2 / delta infection Needs surgical clearance for possible cholecystectomy, still has cholecystostomy drain but pain when clamps and surgery may be needed though nothing immediate needed  per Dr Arnoldo Morale pt has no stones to remove Rec To get the most out of exercise, you need to be continuously aware that you are short of breath, but never out of breath, for at least 30 minutes daily.   Make sure you check your oxygen saturations at highest level of activity to be sure they are maintaining above 90%  You  are cleared for surgery but will need early mobilization and Incentvive spirometry every hour before and after and minimal pain medications      GB surgery x 2   10/02/2021  f/u ov/[place] office/Raelin Pixler re: post covid pna resp failure   Chief  Complaint  Patient presents with   Follow-up    From lobby to room pt O2 sats are at 87% on 3lpm. After sitting O2 sats increased to 92% 3lpm   Sob  and cough are the same as last visit per patient.   Dyspnea:  walking indoors x  64mn on 4lpm pulsed /  Cough: min mucoid Sleeping: recliner 30 degrees/ 2lpm conc SABA use: none  02: 2lpm and 4 lpm walking  Covid status: 2 total vax Lung cancer screening: never smoker    No obvious day to day or daytime variability or assoc excess/ purulent sputum or mucus plugs or hemoptysis or cp or chest tightness, subjective wheeze or overt sinus or hb symptoms.   Sleeping  without nocturnal  or early am exacerbation  of respiratory  c/o's or need for noct saba. Also denies any obvious fluctuation of symptoms with weather or environmental  changes or other aggravating or alleviating factors except as outlined above   No unusual exposure hx or h/o childhood pna/ asthma or knowledge of premature birth.  Current Allergies, Complete Past Medical History, Past Surgical History, Family History, and Social History were reviewed in [employer] record.  ROS  The following are not active complaints unless bolded Hoarseness, sore throat, dysphagia, dental problems, itching, sneezing,  nasal congestion or discharge of excess mucus or purulent secretions, ear ache,   fever, chills, sweats, unintended wt loss or wt gain, classically pleuritic or exertional cp,  orthopnea pnd or arm/hand swelling  or leg feet swelling, presyncope, palpitations, abdominal pain, anorexia, nausea, vomiting, diarrhea  or change in bowel habits or change in bladder habits, change in stools or change in urine, dysuria, hematuria,  rash, arthralgias, visual complaints, headache, numbness, weakness or ataxia or problems with walking or coordination,  change in mood or  memory.        Current Meds  Medication Sig   ACCU-CHEK GUIDE test strip USE TO TEST TWICE DAILY.D    acetaminophen (TYLENOL) 500 MG tablet Take 1,000 mg by mouth 2 (two) times daily as needed for moderate pain.   alclomethasone (ACLOVATE) 0.05 % cream Apply topically 2 (two) times daily as needed (Rash).   ALPRAZolam (XANAX) 0.25 MG tablet Take 0.25 mg by mouth daily as needed for anxiety.   alum & mag hydroxide-simeth (MAALOX/MYLANTA) 200-200-20 MG/5ML suspension Take 30 mLs by mouth every 4 (four) hours as needed for indigestion.   ascorbic acid (VITAMIN C) 500 MG tablet Take 1 tablet (500 mg total) by mouth daily.   aspirin EC 81 MG tablet Take 81 mg by mouth daily. Swallow whole.   blood glucose meter kit and supplies KIT Dispense based on patient and insurance preference. Use up to four times daily as directed. (FOR ICD-9 250.00, 250.01).   Cholecalciferol (VITAMIN D3) 125 MCG (5000 UT) CAPS Take 5,000 Units by mouth daily.   co-enzyme Q-10 50 MG capsule Take 50 mg by mouth 2 (two) times daily.   famotidine (PEPCID) 20 MG tablet Take 20 mg by mouth daily after supper.   FIASP FLEXTOUCH 100 UNIT/ML FlexTouch Pen Inject 2-8 Units into the skin every evening.   furosemide (LASIX) 20 MG tablet Take 20 mg by mouth daily as needed for edema.   ibuprofen (ADVIL) 600 MG tablet Take 1 tablet (600 mg total) by mouth every 6 (six) hours as needed.   Insulin Pen Needle (PEN NEEDLES) 32G X 4 MM MISC 1 Package by Does not apply route 4 (four) times daily -  before meals and at bedtime.   JANUVIA 100 MG tablet Take 100 mg by mouth daily.   ketoconazole (NIZORAL) 2 % cream Apply 1 application topically in the morning and at bedtime.   metFORMIN (GLUCOPHAGE) 1000 MG tablet Take 1,000 mg by mouth 2 (two) times daily with a meal.   Multiple Vitamin (MULTIVITAMIN) capsule Take 1 capsule by mouth daily.   olmesartan-hydrochlorothiazide (BENICAR HCT) 20-12.5 MG tablet Take 1 tablet by mouth daily.   pantoprazole (PROTONIX) 40 MG tablet Take 40 mg by mouth daily.   Phenylephrine HCl (SINEX REGULAR NA) Place 1  spray into the nose daily as needed (congestion).   rosuvastatin (CRESTOR) 5 MG tablet Take 5 mg by mouth daily.   Sodium Chloride Flush (NORMAL SALINE FLUSH) 0.9 % SOLN Give 10 mLs by tube daily.   Sodium Chloride Flush (NORMAL SALINE FLUSH) 0.9 % SOLN Flush  once daily with 1m   TOUJEO MAX SOLOSTAR 300 UNIT/ML Solostar Pen Inject 30 Units into the skin in the morning.   triamcinolone cream (KENALOG) 0.1 % Apply to abdomen qd- bid   zinc sulfate 220 (50 Zn) MG capsule Take 1 capsule (220 mg total) by mouth daily.                    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   Primary insomnia    Seborrheic dermatitis, unspecified    Squamous cell carcinoma of skin 06/14/2011   left forearm medial [id number] [id number]  Thrombocytopenia, unspecified (HCC)         Objective:    10/02/2021       199   08/01/2021      189   03/23/21 201 lb (91.2 kg)  02/09/21 200 lb (90.7 kg)  12/27/20 197 lb (89.4 kg)    Vital signs reviewed  10/02/2021  - Note at rest 02 sats  92% on 3lpm  General appearance:    Chronically ill, sits bent over at upper chest/ neck      LUNGS: no acc muscle use,  Nl contour chest with decreased bs and min insp crackles bases bilaterally       HEENT : pt wearing mask not removed for exam due to covid -19 concerns.    NECK :  without JVD/Nodes/TM/ nl carotid upstrokes bilaterally   LUNGS: no acc muscle use,  Nl contour chest with decreased bs and min insp crackles bilaterally without cough on insp or exp maneuvers   CV:  RRR  no s3 or murmur or increase in P2, and   R> L pitting edema both LE's   ABD:  obese nontender with nl inspiratory excursion in the supine position. No bruits or organomegaly appreciated, bowel sounds nl  MS:  Nl gait/ ext warm without deformities, calf tenderness, cyanosis or clubbing No obvious joint restrictions   SKIN: warm and dry without lesions    NEURO:  alert, approp, nl sensorium with  no motor or cerebellar deficits apparent.           CXR PA and Lateral:   10/02/2021 :    I personally reviewed images and agree with radiology impression as follows:    1. Mid and lower lung interstitial and airspace opacities may represent COVID infection given the clinical history. 2. Left upper lobe nodular density is similar to the prior CT. My review: no change since CTa 04/04/21         Assessment

## 2021-10-02 NOTE — Patient Instructions (Signed)
Make sure you check your oxygen saturation  at your highest level of activity  to be sure it stays over 90% and adjust  02 flow upward to maintain this level if needed but remember to turn it back to previous settings when you stop (to conserve your supply).    Bicycle ergometer should be next to your concentrator   Incentive spirometry as much as possible   Please remember to go to the  x-ray department  @  [facility] for your tests - we will call you with the results when they are available      We will schedule you for venous dopplers next available    Please schedule a follow up visit in 3 months but call sooner if needed

## 2021-10-03 ENCOUNTER — Other Ambulatory Visit: Payer: Self-pay | Admitting: Internal Medicine

## 2021-10-03 ENCOUNTER — Encounter: Payer: Self-pay | Admitting: Internal Medicine

## 2021-10-03 DIAGNOSIS — R911 Solitary pulmonary nodule: Secondary | ICD-10-CM

## 2021-10-03 NOTE — Assessment & Plan Note (Signed)
Onset of illness 07/22/20 c/b bilateral dvt and L PTX / 02 dep at d/c 09/28/20  -  02/09/2021   Walked RA  approx   100 ft  @ slow pace  stopped due to  desats to 87% corrected to 90% on 1lpm  POC x another 100 ft then desat again   - PFT's  03/07/21   FEV1 1.26 (37 % ) ratio 0.92  p 4 % improvement from saba p 0 prior to study with DLCO  11.40 (46%) corrects to 3.09 (128%)  for alv volume and FV curve nl   - CTa 04/04/21 1. No evidence of pulmonary embolism. 2. Findings suggestive of mild to moderate severity interstitial edema. A superimposed infectious component cannot be excluded. 3. Stable posterior left upper lobe noncalcified lung nodule versus scarring.    cxr is unchanged as is his gas exchange and I don't think he will improve at this point related to scarring from covid and obesity with the latter problem the only likely reversible component at this point/ discussed.

## 2021-10-03 NOTE — Assessment & Plan Note (Signed)
See venous dopplers 08/09/20 pos bilateral dvt with echo 08/26/20 neg for elevated R sided pressures  - repeat venous dopplers 03/29/2021 >>>  No evidence of acute or chronic DVT within either lower extremity. - 10/02/2021 rec repeat venous dopplers since swelling is worse

## 2021-10-03 NOTE — Assessment & Plan Note (Addendum)
Onset with covid pna Aug 2021  - desats on RA p 100 ft 02/09/2021 > titrate to sats > 90% using POC max flow of 6lpm  - 10/02/2021 patient walked at a slow pace on 2lpm. During the second lap he reported SOB and desat to 87%. Increased oxygen on second lap to 3lpm POC  and recovered to 92%.  Advised: Make sure you check your oxygen saturation  at your highest level of activity  to be sure it stays over 90% and adjust  02 flow upward to maintain this level if needed but remember to turn it back to previous settings when you stop (to conserve your supply).   Each maintenance medication was reviewed in detail including emphasizing most importantly the difference between maintenance and prns and under what circumstances the prns are to be triggered using an action plan format where appropriate.  Total time for H and P, chart review, counseling,  directly observing portions of ambulatory 02 saturation study/ and generating customized AVS unique to this office visit / same day charting > 30 min

## 2021-10-04 DIAGNOSIS — Z23 Encounter for immunization: Secondary | ICD-10-CM | POA: Diagnosis not present

## 2021-10-04 DIAGNOSIS — M7989 Other specified soft tissue disorders: Secondary | ICD-10-CM | POA: Diagnosis not present

## 2021-10-04 DIAGNOSIS — I5042 Chronic combined systolic (congestive) and diastolic (congestive) heart failure: Secondary | ICD-10-CM | POA: Diagnosis not present

## 2021-10-04 DIAGNOSIS — K810 Acute cholecystitis: Secondary | ICD-10-CM | POA: Diagnosis not present

## 2021-10-04 DIAGNOSIS — M542 Cervicalgia: Secondary | ICD-10-CM | POA: Diagnosis not present

## 2021-10-04 DIAGNOSIS — I10 Essential (primary) hypertension: Secondary | ICD-10-CM | POA: Diagnosis not present

## 2021-10-04 DIAGNOSIS — E782 Mixed hyperlipidemia: Secondary | ICD-10-CM | POA: Diagnosis not present

## 2021-10-04 DIAGNOSIS — J9621 Acute and chronic respiratory failure with hypoxia: Secondary | ICD-10-CM | POA: Diagnosis not present

## 2021-10-04 DIAGNOSIS — J9611 Chronic respiratory failure with hypoxia: Secondary | ICD-10-CM | POA: Diagnosis not present

## 2021-10-04 DIAGNOSIS — M21371 Foot drop, right foot: Secondary | ICD-10-CM | POA: Diagnosis not present

## 2021-10-04 DIAGNOSIS — E1165 Type 2 diabetes mellitus with hyperglycemia: Secondary | ICD-10-CM | POA: Diagnosis not present

## 2021-10-04 DIAGNOSIS — R809 Proteinuria, unspecified: Secondary | ICD-10-CM | POA: Diagnosis not present

## 2021-10-05 ENCOUNTER — Other Ambulatory Visit: Payer: Self-pay

## 2021-10-05 ENCOUNTER — Ambulatory Visit (HOSPITAL_COMMUNITY)
Admission: RE | Admit: 2021-10-05 | Discharge: 2021-10-05 | Disposition: A | Discharge status: Home / Self Care | Payer: HMO | Source: Ambulatory Visit | Attending: Internal Medicine | Admitting: Internal Medicine

## 2021-10-05 DIAGNOSIS — M79605 Pain in left leg: Secondary | ICD-10-CM | POA: Diagnosis not present

## 2021-10-05 DIAGNOSIS — M79604 Pain in right leg: Secondary | ICD-10-CM | POA: Diagnosis not present

## 2021-10-05 DIAGNOSIS — I82403 Acute embolism and thrombosis of unspecified deep veins of lower extremity, bilateral: Secondary | ICD-10-CM | POA: Diagnosis not present

## 2021-10-05 DIAGNOSIS — R600 Localized edema: Secondary | ICD-10-CM | POA: Diagnosis not present

## 2021-10-19 ENCOUNTER — Encounter (HOSPITAL_COMMUNITY): Payer: Self-pay

## 2021-10-19 ENCOUNTER — Ambulatory Visit (HOSPITAL_COMMUNITY): Payer: HMO | Attending: Internal Medicine

## 2021-10-19 ENCOUNTER — Other Ambulatory Visit: Payer: Self-pay

## 2021-10-19 DIAGNOSIS — M25471 Effusion, right ankle: Secondary | ICD-10-CM | POA: Insufficient documentation

## 2021-10-19 DIAGNOSIS — R293 Abnormal posture: Secondary | ICD-10-CM | POA: Insufficient documentation

## 2021-10-19 DIAGNOSIS — M6281 Muscle weakness (generalized): Secondary | ICD-10-CM | POA: Insufficient documentation

## 2021-10-19 DIAGNOSIS — M542 Cervicalgia: Secondary | ICD-10-CM | POA: Diagnosis not present
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
## 2021-10-23 ENCOUNTER — Ambulatory Visit (HOSPITAL_COMMUNITY): Payer: HMO | Admitting: Physical Therapy

## 2021-10-29 ENCOUNTER — Ambulatory Visit (HOSPITAL_COMMUNITY): Payer: HMO

## 2021-10-29 ENCOUNTER — Other Ambulatory Visit: Payer: Self-pay

## 2021-10-29 ENCOUNTER — Encounter (HOSPITAL_COMMUNITY): Payer: Self-pay

## 2021-10-29 DIAGNOSIS — M542 Cervicalgia: Secondary | ICD-10-CM

## 2021-10-29 DIAGNOSIS — R293 Abnormal posture: Secondary | ICD-10-CM

## 2021-10-29 DIAGNOSIS — M6281 Muscle weakness (generalized): Secondary | ICD-10-CM

## 2021-10-29 NOTE — Patient Instructions (Addendum)
Upper Cervical Rotation    Lying on your back with a softer ball behind your head. Maybe a pillow as well. Rotate head slowly from side to side as if saying "no". Hold end position for 5 seconds.  Repeat _10_ times per set. Do _1_ sets per session. Do _1___ sessions per day.  http://orth.exer.us/374   Copyright  VHI. All rights reserved.   DECOMPRESSION EXERCISES 1-5 LYING ON A HARD(ER) SURFACE; 2 PILLOWS UNDER HEAD TO START; REMOVE A PILLOW WHEN ABLE.
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
## 2021-10-31 ENCOUNTER — Other Ambulatory Visit: Payer: Self-pay

## 2021-10-31 ENCOUNTER — Ambulatory Visit (HOSPITAL_COMMUNITY): Payer: HMO | Admitting: Physical Therapy

## 2021-10-31 DIAGNOSIS — M6281 Muscle weakness (generalized): Secondary | ICD-10-CM

## 2021-10-31 DIAGNOSIS — M25471 Effusion, right ankle: Secondary | ICD-10-CM

## 2021-10-31 DIAGNOSIS — R293 Abnormal posture: Secondary | ICD-10-CM

## 2021-10-31 DIAGNOSIS — M542 Cervicalgia: Secondary | ICD-10-CM
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
## 2021-11-05 ENCOUNTER — Ambulatory Visit (HOSPITAL_COMMUNITY): Payer: HMO | Attending: Internal Medicine

## 2021-11-05 ENCOUNTER — Other Ambulatory Visit: Payer: Self-pay

## 2021-11-05 DIAGNOSIS — Z9981 Dependence on supplemental oxygen: Secondary | ICD-10-CM | POA: Diagnosis not present

## 2021-11-05 DIAGNOSIS — R293 Abnormal posture: Secondary | ICD-10-CM | POA: Insufficient documentation

## 2021-11-05 DIAGNOSIS — M25471 Effusion, right ankle: Secondary | ICD-10-CM | POA: Insufficient documentation

## 2021-11-05 DIAGNOSIS — R059 Cough, unspecified: Secondary | ICD-10-CM | POA: Diagnosis not present

## 2021-11-05 DIAGNOSIS — J029 Acute pharyngitis, unspecified: Secondary | ICD-10-CM | POA: Diagnosis not present

## 2021-11-05 DIAGNOSIS — M6281 Muscle weakness (generalized): Secondary | ICD-10-CM | POA: Diagnosis not present

## 2021-11-05 DIAGNOSIS — M542 Cervicalgia: Secondary | ICD-10-CM | POA: Insufficient documentation

## 2021-11-05 DIAGNOSIS — R0981 Nasal congestion: Secondary | ICD-10-CM | POA: Diagnosis not present
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
## 2021-11-06 DIAGNOSIS — J449 Chronic obstructive pulmonary disease, unspecified: Secondary | ICD-10-CM | POA: Diagnosis not present

## 2021-11-06 DIAGNOSIS — J9611 Chronic respiratory failure with hypoxia: Secondary | ICD-10-CM | POA: Diagnosis not present

## 2021-11-06 DIAGNOSIS — I5040 Unspecified combined systolic (congestive) and diastolic (congestive) heart failure: Secondary | ICD-10-CM | POA: Diagnosis not present

## 2021-11-09 ENCOUNTER — Other Ambulatory Visit: Payer: Self-pay

## 2021-11-09 ENCOUNTER — Ambulatory Visit (HOSPITAL_COMMUNITY): Payer: HMO

## 2021-11-09 ENCOUNTER — Encounter (HOSPITAL_COMMUNITY): Payer: Self-pay

## 2021-11-09 DIAGNOSIS — M6281 Muscle weakness (generalized): Secondary | ICD-10-CM | POA: Diagnosis not present

## 2021-11-09 DIAGNOSIS — R293 Abnormal posture: Secondary | ICD-10-CM

## 2021-11-09 DIAGNOSIS — M25471 Effusion, right ankle: Secondary | ICD-10-CM

## 2021-11-09 DIAGNOSIS — M542 Cervicalgia: Secondary | ICD-10-CM
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
## 2021-11-12 ENCOUNTER — Ambulatory Visit (HOSPITAL_COMMUNITY): Payer: HMO

## 2021-11-12 ENCOUNTER — Other Ambulatory Visit: Payer: Self-pay

## 2021-11-12 DIAGNOSIS — M6281 Muscle weakness (generalized): Secondary | ICD-10-CM | POA: Diagnosis not present

## 2021-11-12 DIAGNOSIS — R293 Abnormal posture: Secondary | ICD-10-CM

## 2021-11-12 DIAGNOSIS — M542 Cervicalgia: Secondary | ICD-10-CM
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
## 2021-11-14 ENCOUNTER — Ambulatory Visit (HOSPITAL_COMMUNITY): Payer: HMO

## 2021-11-14 ENCOUNTER — Other Ambulatory Visit: Payer: Self-pay

## 2021-11-14 DIAGNOSIS — M542 Cervicalgia: Secondary | ICD-10-CM

## 2021-11-14 DIAGNOSIS — M6281 Muscle weakness (generalized): Secondary | ICD-10-CM

## 2021-11-14 DIAGNOSIS — R293 Abnormal posture: Secondary | ICD-10-CM

## 2021-11-14 DIAGNOSIS — R579 Shock, unspecified: Secondary | ICD-10-CM | POA: Diagnosis not present

## 2021-11-14 DIAGNOSIS — A419 Sepsis, unspecified organism: Secondary | ICD-10-CM | POA: Diagnosis not present
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
## 2021-11-16 DIAGNOSIS — R404 Transient alteration of awareness: Secondary | ICD-10-CM | POA: Diagnosis not present

## 2021-11-16 DIAGNOSIS — E782 Mixed hyperlipidemia: Secondary | ICD-10-CM | POA: Diagnosis not present

## 2021-11-16 DIAGNOSIS — R0689 Other abnormalities of breathing: Secondary | ICD-10-CM | POA: Diagnosis not present

## 2021-11-16 DIAGNOSIS — M7989 Other specified soft tissue disorders: Secondary | ICD-10-CM | POA: Diagnosis not present

## 2021-11-16 DIAGNOSIS — J9611 Chronic respiratory failure with hypoxia: Secondary | ICD-10-CM | POA: Diagnosis not present

## 2021-11-16 DIAGNOSIS — M542 Cervicalgia: Secondary | ICD-10-CM | POA: Diagnosis not present

## 2021-11-16 DIAGNOSIS — M21371 Foot drop, right foot: Secondary | ICD-10-CM | POA: Diagnosis not present

## 2021-11-16 DIAGNOSIS — J9621 Acute and chronic respiratory failure with hypoxia: Secondary | ICD-10-CM | POA: Diagnosis not present

## 2021-11-16 DIAGNOSIS — R809 Proteinuria, unspecified: Secondary | ICD-10-CM | POA: Diagnosis not present

## 2021-11-16 DIAGNOSIS — I10 Essential (primary) hypertension: Secondary | ICD-10-CM | POA: Diagnosis not present

## 2021-11-16 DIAGNOSIS — R739 Hyperglycemia, unspecified: Secondary | ICD-10-CM | POA: Diagnosis not present

## 2021-11-16 DIAGNOSIS — K810 Acute cholecystitis: Secondary | ICD-10-CM | POA: Diagnosis not present

## 2021-11-16 DIAGNOSIS — I5042 Chronic combined systolic (congestive) and diastolic (congestive) heart failure: Secondary | ICD-10-CM | POA: Diagnosis not present

## 2021-11-16 DIAGNOSIS — R0902 Hypoxemia: Secondary | ICD-10-CM | POA: Diagnosis not present

## 2021-11-16 DIAGNOSIS — F418 Other specified anxiety disorders: Secondary | ICD-10-CM | POA: Diagnosis not present

## 2021-11-16 DIAGNOSIS — E1165 Type 2 diabetes mellitus with hyperglycemia: Secondary | ICD-10-CM | POA: Diagnosis not present

## 2021-11-16 DIAGNOSIS — I499 Cardiac arrhythmia, unspecified: Secondary | ICD-10-CM | POA: Diagnosis not present

## 2021-11-17 ENCOUNTER — Other Ambulatory Visit: Payer: Self-pay

## 2021-11-17 ENCOUNTER — Emergency Department (HOSPITAL_COMMUNITY): Payer: HMO

## 2021-11-17 ENCOUNTER — Inpatient Hospital Stay (HOSPITAL_COMMUNITY)
Admission: EM | Admit: 2021-11-17 | Discharge: 2021-11-20 | DRG: 871 | Disposition: A | Discharge status: Home / Self Care | Payer: HMO | Attending: Internal Medicine | Admitting: Internal Medicine

## 2021-11-17 ENCOUNTER — Encounter (HOSPITAL_COMMUNITY): Payer: Self-pay | Admitting: Internal Medicine

## 2021-11-17 ENCOUNTER — Inpatient Hospital Stay (HOSPITAL_COMMUNITY): Payer: HMO

## 2021-11-17 DIAGNOSIS — Z7984 Long term (current) use of oral hypoglycemic drugs: Secondary | ICD-10-CM

## 2021-11-17 DIAGNOSIS — F5101 Primary insomnia: Secondary | ICD-10-CM | POA: Diagnosis not present

## 2021-11-17 DIAGNOSIS — M6281 Muscle weakness (generalized): Secondary | ICD-10-CM

## 2021-11-17 DIAGNOSIS — E782 Mixed hyperlipidemia: Secondary | ICD-10-CM | POA: Diagnosis not present

## 2021-11-17 DIAGNOSIS — R7401 Elevation of levels of liver transaminase levels: Secondary | ICD-10-CM | POA: Diagnosis present

## 2021-11-17 DIAGNOSIS — Z833 Family history of diabetes mellitus: Secondary | ICD-10-CM

## 2021-11-17 DIAGNOSIS — E11649 Type 2 diabetes mellitus with hypoglycemia without coma: Secondary | ICD-10-CM | POA: Diagnosis not present

## 2021-11-17 DIAGNOSIS — Z20822 Contact with and (suspected) exposure to covid-19: Secondary | ICD-10-CM | POA: Diagnosis not present

## 2021-11-17 DIAGNOSIS — J189 Pneumonia, unspecified organism: Secondary | ICD-10-CM | POA: Diagnosis present

## 2021-11-17 DIAGNOSIS — G9341 Metabolic encephalopathy: Secondary | ICD-10-CM | POA: Diagnosis not present

## 2021-11-17 DIAGNOSIS — J9621 Acute and chronic respiratory failure with hypoxia: Secondary | ICD-10-CM | POA: Diagnosis not present

## 2021-11-17 DIAGNOSIS — I6381 Other cerebral infarction due to occlusion or stenosis of small artery: Secondary | ICD-10-CM | POA: Diagnosis not present

## 2021-11-17 DIAGNOSIS — I517 Cardiomegaly: Secondary | ICD-10-CM | POA: Diagnosis not present

## 2021-11-17 DIAGNOSIS — E875 Hyperkalemia: Secondary | ICD-10-CM | POA: Diagnosis present

## 2021-11-17 DIAGNOSIS — A419 Sepsis, unspecified organism: Secondary | ICD-10-CM | POA: Diagnosis not present

## 2021-11-17 DIAGNOSIS — R911 Solitary pulmonary nodule: Secondary | ICD-10-CM | POA: Diagnosis present

## 2021-11-17 DIAGNOSIS — F32A Depression, unspecified: Secondary | ICD-10-CM | POA: Diagnosis present

## 2021-11-17 DIAGNOSIS — R6521 Severe sepsis with septic shock: Secondary | ICD-10-CM | POA: Diagnosis present

## 2021-11-17 DIAGNOSIS — E1165 Type 2 diabetes mellitus with hyperglycemia: Secondary | ICD-10-CM | POA: Insufficient documentation

## 2021-11-17 DIAGNOSIS — E8729 Other acidosis: Secondary | ICD-10-CM | POA: Diagnosis present

## 2021-11-17 DIAGNOSIS — G9389 Other specified disorders of brain: Secondary | ICD-10-CM | POA: Diagnosis present

## 2021-11-17 DIAGNOSIS — R579 Shock, unspecified: Secondary | ICD-10-CM

## 2021-11-17 DIAGNOSIS — R000 Tachycardia, unspecified: Secondary | ICD-10-CM | POA: Diagnosis not present

## 2021-11-17 DIAGNOSIS — F419 Anxiety disorder, unspecified: Secondary | ICD-10-CM | POA: Diagnosis present

## 2021-11-17 DIAGNOSIS — J9811 Atelectasis: Secondary | ICD-10-CM | POA: Diagnosis not present

## 2021-11-17 DIAGNOSIS — J9622 Acute and chronic respiratory failure with hypercapnia: Secondary | ICD-10-CM | POA: Diagnosis not present

## 2021-11-17 DIAGNOSIS — J9602 Acute respiratory failure with hypercapnia: Secondary | ICD-10-CM | POA: Diagnosis not present

## 2021-11-17 DIAGNOSIS — E861 Hypovolemia: Secondary | ICD-10-CM | POA: Diagnosis present

## 2021-11-17 DIAGNOSIS — Z8249 Family history of ischemic heart disease and other diseases of the circulatory system: Secondary | ICD-10-CM

## 2021-11-17 DIAGNOSIS — I119 Hypertensive heart disease without heart failure: Secondary | ICD-10-CM | POA: Diagnosis present

## 2021-11-17 DIAGNOSIS — J690 Pneumonitis due to inhalation of food and vomit: Secondary | ICD-10-CM | POA: Diagnosis present

## 2021-11-17 DIAGNOSIS — Z79899 Other long term (current) drug therapy: Secondary | ICD-10-CM

## 2021-11-17 DIAGNOSIS — Z794 Long term (current) use of insulin: Secondary | ICD-10-CM

## 2021-11-17 DIAGNOSIS — H109 Unspecified conjunctivitis: Secondary | ICD-10-CM | POA: Diagnosis not present

## 2021-11-17 DIAGNOSIS — Z85820 Personal history of malignant melanoma of skin: Secondary | ICD-10-CM | POA: Diagnosis not present

## 2021-11-17 DIAGNOSIS — I672 Cerebral atherosclerosis: Secondary | ICD-10-CM | POA: Diagnosis not present

## 2021-11-17 DIAGNOSIS — S0990XA Unspecified injury of head, initial encounter: Secondary | ICD-10-CM | POA: Diagnosis not present

## 2021-11-17 DIAGNOSIS — K8020 Calculus of gallbladder without cholecystitis without obstruction: Secondary | ICD-10-CM

## 2021-11-17 DIAGNOSIS — Z9981 Dependence on supplemental oxygen: Secondary | ICD-10-CM

## 2021-11-17 DIAGNOSIS — E860 Dehydration: Secondary | ICD-10-CM | POA: Diagnosis not present

## 2021-11-17 DIAGNOSIS — I10 Essential (primary) hypertension: Secondary | ICD-10-CM | POA: Diagnosis not present

## 2021-11-17 DIAGNOSIS — Z7982 Long term (current) use of aspirin: Secondary | ICD-10-CM

## 2021-11-17 DIAGNOSIS — A084 Viral intestinal infection, unspecified: Secondary | ICD-10-CM | POA: Insufficient documentation

## 2021-11-17 DIAGNOSIS — K219 Gastro-esophageal reflux disease without esophagitis: Secondary | ICD-10-CM | POA: Diagnosis not present

## 2021-11-17 LAB — COMPREHENSIVE METABOLIC PANEL
ALT: UNDETERMINED U/L (ref 0–44)
ALT: 52 U/L — ABNORMAL HIGH (ref 0–44)
AST: UNDETERMINED U/L (ref 15–41)
AST: 61 U/L — ABNORMAL HIGH (ref 15–41)
Albumin: 3.7 g/dL (ref 3.5–5.0)
Albumin: 3 g/dL — ABNORMAL LOW (ref 3.5–5.0)
Alkaline Phosphatase: 59 U/L (ref 38–126)
Alkaline Phosphatase: 50 U/L (ref 38–126)
Anion gap: 14 (ref 5–15)
Anion gap: 14 (ref 5–15)
BUN: 35 mg/dL — ABNORMAL HIGH (ref 8–23)
BUN: 34 mg/dL — ABNORMAL HIGH (ref 8–23)
CO2: 21 mmol/L — ABNORMAL LOW (ref 22–32)
CO2: 23 mmol/L (ref 22–32)
Calcium: 8.3 mg/dL — ABNORMAL LOW (ref 8.9–10.3)
Calcium: 8 mg/dL — ABNORMAL LOW (ref 8.9–10.3)
Chloride: 104 mmol/L (ref 98–111)
Chloride: 98 mmol/L (ref 98–111)
Creatinine, Ser: 1.02 mg/dL (ref 0.61–1.24)
Creatinine, Ser: 1.19 mg/dL (ref 0.61–1.24)
GFR, Estimated: >60 mL/min (ref >60)
GFR, Estimated: >60 mL/min (ref >60)
Glucose, Bld: 275 mg/dL — ABNORMAL HIGH (ref 70–99)
Glucose, Bld: 291 mg/dL — ABNORMAL HIGH (ref 70–99)
Potassium: 4.8 mmol/L (ref 3.5–5.1)
Potassium: 5.2 mmol/L — ABNORMAL HIGH (ref 3.5–5.1)
Sodium: 139 mmol/L (ref 135–145)
Sodium: 135 mmol/L (ref 135–145)
Total Bilirubin: UNDETERMINED mg/dL (ref 0.3–1.2)
Total Bilirubin: 1.3 mg/dL — ABNORMAL HIGH (ref 0.3–1.2)
Total Protein: 6.7 g/dL (ref 6.5–8.1)
Total Protein: 5.7 g/dL — ABNORMAL LOW (ref 6.5–8.1)

## 2021-11-17 LAB — CBC WITH DIFFERENTIAL/PLATELET
Abs Immature Granulocytes: 0.12 10*3/uL — ABNORMAL HIGH (ref 0.00–0.07)
Abs Immature Granulocytes: 0.06 10*3/uL (ref 0.00–0.07)
Basophils Absolute: 0.1 10*3/uL (ref 0.0–0.1)
Basophils Absolute: 0 10*3/uL (ref 0.0–0.1)
Basophils Relative: 0 %
Basophils Relative: 0 %
Eosinophils Absolute: 0.2 10*3/uL (ref 0.0–0.5)
Eosinophils Absolute: 0 10*3/uL (ref 0.0–0.5)
Eosinophils Relative: 1 %
Eosinophils Relative: 0 %
HCT: 55.6 % — ABNORMAL HIGH (ref 39.0–52.0)
HCT: 45.1 % (ref 39.0–52.0)
Hemoglobin: 16.4 g/dL (ref 13.0–17.0)
Hemoglobin: 14.1 g/dL (ref 13.0–17.0)
Immature Granulocytes: 1 %
Immature Granulocytes: 1 %
Lymphocytes Relative: 16 %
Lymphocytes Relative: 1 %
Lymphs Abs: 3 10*3/uL (ref 0.7–4.0)
Lymphs Abs: 0.2 10*3/uL — ABNORMAL LOW (ref 0.7–4.0)
MCH: 28.4 pg (ref 26.0–34.0)
MCH: 28.7 pg (ref 26.0–34.0)
MCHC: 29.5 g/dL — ABNORMAL LOW (ref 30.0–36.0)
MCHC: 31.3 g/dL (ref 30.0–36.0)
MCV: 96.2 fL (ref 80.0–100.0)
MCV: 91.7 fL (ref 80.0–100.0)
Monocytes Absolute: 1.1 10*3/uL — ABNORMAL HIGH (ref 0.1–1.0)
Monocytes Absolute: 0.6 10*3/uL (ref 0.1–1.0)
Monocytes Relative: 6 %
Monocytes Relative: 6 %
Neutro Abs: 14.7 10*3/uL — ABNORMAL HIGH (ref 1.7–7.7)
Neutro Abs: 9.8 10*3/uL — ABNORMAL HIGH (ref 1.7–7.7)
Neutrophils Relative %: 76 %
Neutrophils Relative %: 92 %
Platelets: 246 10*3/uL (ref 150–400)
Platelets: 170 10*3/uL (ref 150–400)
RBC: 5.78 MIL/uL (ref 4.22–5.81)
RBC: 4.92 MIL/uL (ref 4.22–5.81)
RDW: 15 % (ref 11.5–15.5)
RDW: 14.8 % (ref 11.5–15.5)
WBC: 19.2 10*3/uL — ABNORMAL HIGH (ref 4.0–10.5)
WBC: 10.6 10*3/uL — ABNORMAL HIGH (ref 4.0–10.5)
nRBC: 0 % (ref 0.0–0.2)
nRBC: 0 % (ref 0.0–0.2)

## 2021-11-17 LAB — RAPID URINE DRUG SCREEN, HOSP PERFORMED
Amphetamines: NOT DETECTED
Barbiturates: NOT DETECTED
Benzodiazepines: POSITIVE — AB
Cocaine: NOT DETECTED
Opiates: NOT DETECTED
Tetrahydrocannabinol: NOT DETECTED

## 2021-11-17 LAB — URINALYSIS, ROUTINE W REFLEX MICROSCOPIC
Bilirubin Urine: NEGATIVE
Glucose, UA: >=500 mg/dL — AB
Hgb urine dipstick: NEGATIVE
Ketones, ur: 40 mg/dL — AB
Leukocytes,Ua: NEGATIVE
Nitrite: NEGATIVE
Protein, ur: 100 mg/dL — AB
Specific Gravity, Urine: 1.025 (ref 1.005–1.030)
pH: 5.5 (ref 5.0–8.0)

## 2021-11-17 LAB — I-STAT ARTERIAL BLOOD GAS, ED
Acid-Base Excess: 3 mmol/L — ABNORMAL HIGH (ref 0.0–2.0)
Acid-Base Excess: 1 mmol/L (ref 0.0–2.0)
Bicarbonate: 31 mmol/L — ABNORMAL HIGH (ref 20.0–28.0)
Bicarbonate: 24.2 mmol/L (ref 20.0–28.0)
Calcium, Ion: 1.12 mmol/L — ABNORMAL LOW (ref 1.15–1.40)
Calcium, Ion: 1.08 mmol/L — ABNORMAL LOW (ref 1.15–1.40)
HCT: 47 % (ref 39.0–52.0)
HCT: 43 % (ref 39.0–52.0)
Hemoglobin: 16 g/dL (ref 13.0–17.0)
Hemoglobin: 14.6 g/dL (ref 13.0–17.0)
O2 Saturation: 100 %
O2 Saturation: 99 %
Patient temperature: 98.6
Patient temperature: 97.6
Potassium: 4.5 mmol/L (ref 3.5–5.1)
Potassium: 4.6 mmol/L (ref 3.5–5.1)
Sodium: 138 mmol/L (ref 135–145)
Sodium: 137 mmol/L (ref 135–145)
TCO2: 33 mmol/L — ABNORMAL HIGH (ref 22–32)
TCO2: 25 mmol/L (ref 22–32)
pCO2 arterial: 59.5 mmHg — ABNORMAL HIGH (ref 32.0–48.0)
pCO2 arterial: 32.5 mmHg (ref 32.0–48.0)
pH, Arterial: 7.325 — ABNORMAL LOW (ref 7.350–7.450)
pH, Arterial: 7.478 — ABNORMAL HIGH (ref 7.350–7.450)
pO2, Arterial: 619 mmHg — ABNORMAL HIGH (ref 83.0–108.0)
pO2, Arterial: 140 mmHg — ABNORMAL HIGH (ref 83.0–108.0)

## 2021-11-17 LAB — URINALYSIS, MICROSCOPIC (REFLEX)

## 2021-11-17 LAB — GLUCOSE, CAPILLARY
Glucose-Capillary: 66 mg/dL — ABNORMAL LOW (ref 70–99)
Glucose-Capillary: 96 mg/dL (ref 70–99)
Glucose-Capillary: 79 mg/dL (ref 70–99)
Glucose-Capillary: 76 mg/dL (ref 70–99)
Glucose-Capillary: 82 mg/dL (ref 70–99)

## 2021-11-17 LAB — AMMONIA: Ammonia: 32 umol/L (ref 9–35)

## 2021-11-17 LAB — HEMOGLOBIN A1C
Hgb A1c MFr Bld: 6.3 % — ABNORMAL HIGH (ref 4.8–5.6)
Mean Plasma Glucose: 134.11 mg/dL

## 2021-11-17 LAB — MRSA NEXT GEN BY PCR, NASAL: MRSA by PCR Next Gen: NOT DETECTED

## 2021-11-17 LAB — CBG MONITORING, ED
Glucose-Capillary: 269 mg/dL — ABNORMAL HIGH (ref 70–99)
Glucose-Capillary: 153 mg/dL — ABNORMAL HIGH (ref 70–99)

## 2021-11-17 LAB — LACTIC ACID, PLASMA
Lactic Acid, Venous: 1.6 mmol/L (ref 0.5–1.9)
Lactic Acid, Venous: 2.2 mmol/L (ref 0.5–1.9)

## 2021-11-17 LAB — RESP PANEL BY RT-PCR (FLU A&B, COVID) ARPGX2
Influenza A by PCR: NEGATIVE
Influenza B by PCR: NEGATIVE
SARS Coronavirus 2 by RT PCR: NEGATIVE

## 2021-11-17 LAB — MAGNESIUM: Magnesium: 1.8 mg/dL (ref 1.7–2.4)

## 2021-11-17 LAB — PROTIME-INR
INR: 1.4 — ABNORMAL HIGH (ref 0.8–1.2)
Prothrombin Time: 17.4 seconds — ABNORMAL HIGH (ref 11.4–15.2)

## 2021-11-17 LAB — STREP PNEUMONIAE URINARY ANTIGEN: Strep Pneumo Urinary Antigen: NEGATIVE

## 2021-11-17 LAB — HIV ANTIBODY (ROUTINE TESTING W REFLEX): HIV Screen 4th Generation wRfx: NONREACTIVE

## 2021-11-17 LAB — TROPONIN I (HIGH SENSITIVITY)
Troponin I (High Sensitivity): 23 ng/L — ABNORMAL HIGH (ref <18)
Troponin I (High Sensitivity): 51 ng/L — ABNORMAL HIGH (ref <18)

## 2021-11-17 LAB — ETHANOL: Alcohol, Ethyl (B): <10 mg/dL (ref <10)

## 2021-11-17 LAB — PROCALCITONIN: Procalcitonin: 0.73 ng/mL

## 2021-11-17 MED ORDER — CHLORHEXIDINE GLUCONATE CLOTH 2 % EX PADS
6.0000 | MEDICATED_PAD | Freq: Every day | CUTANEOUS | Status: DC
  Administered 2021-11-17 – 2021-11-20 (×4): 6 via TOPICAL

## 2021-11-17 MED ORDER — DOCUSATE SODIUM 50 MG/5ML PO LIQD: 100.0000 mg | Freq: Two times a day (BID) | ORAL | Status: DC | PRN

## 2021-11-17 MED ORDER — ORAL CARE MOUTH RINSE
15.0000 mL | OROMUCOSAL | Status: DC
  Administered 2021-11-17 – 2021-11-18 (×7): 15 mL via OROMUCOSAL

## 2021-11-17 MED ORDER — FENTANYL BOLUS VIA INFUSION
25.0000 ug | INTRAVENOUS | Status: DC | PRN
  Administered 2021-11-17 – 2021-11-18 (×2): 25 ug via INTRAVENOUS
  Filled 2021-11-17: qty 100

## 2021-11-17 MED ORDER — SODIUM CHLORIDE 0.9 % IV SOLN
2.0000 g | Freq: Once | INTRAVENOUS | Status: DC
  Administered 2021-11-17: 2 g via INTRAVENOUS
  Filled 2021-11-17: qty 2

## 2021-11-17 MED ORDER — POLYETHYLENE GLYCOL 3350 17 G PO PACK: 17.0000 g | PACK | Freq: Every day | ORAL | Status: DC | PRN

## 2021-11-17 MED ORDER — ASPIRIN 81 MG PO CHEW
81.0000 mg | CHEWABLE_TABLET | Freq: Every day | ORAL | Status: DC
  Administered 2021-11-17: 81 mg
  Filled 2021-11-17 (×2): qty 1

## 2021-11-17 MED ORDER — LACTATED RINGERS IV SOLN: INTRAVENOUS | Status: DC

## 2021-11-17 MED ORDER — SODIUM CHLORIDE 0.9 % IV SOLN
3.0000 g | Freq: Three times a day (TID) | INTRAVENOUS | Status: DC
  Administered 2021-11-17 – 2021-11-20 (×10): 3 g via INTRAVENOUS
  Filled 2021-11-17 (×13): qty 8

## 2021-11-17 MED ORDER — BETHANECHOL CHLORIDE 10 MG PO TABS
10.0000 mg | ORAL_TABLET | Freq: Three times a day (TID) | ORAL | Status: DC
  Filled 2021-11-17 (×2): qty 1

## 2021-11-17 MED ORDER — PANTOPRAZOLE 2 MG/ML SUSPENSION
40.0000 mg | Freq: Every day | ORAL | Status: DC
  Administered 2021-11-17: 40 mg
  Filled 2021-11-17 (×2): qty 20

## 2021-11-17 MED ORDER — FENTANYL 2500MCG IN NS 250ML (10MCG/ML) PREMIX INFUSION
25.0000 ug/h | INTRAVENOUS | Status: DC
Start: 2021-11-17 — End: 2021-11-18
  Administered 2021-11-17: 25 ug/h via INTRAVENOUS
  Filled 2021-11-17: qty 250

## 2021-11-17 MED ORDER — MIDAZOLAM HCL 2 MG/2ML IJ SOLN
2.0000 mg | INTRAMUSCULAR | Status: DC | PRN
Start: 2021-11-17 — End: 2021-11-17
  Administered 2021-11-17: 2 mg via INTRAVENOUS
  Filled 2021-11-17 (×2): qty 2

## 2021-11-17 MED ORDER — INSULIN ASPART 100 UNIT/ML IJ SOLN
0.0000 [IU] | INTRAMUSCULAR | Status: DC
  Administered 2021-11-17: 2 [IU] via SUBCUTANEOUS
  Administered 2021-11-17: 8 [IU] via SUBCUTANEOUS
  Administered 2021-11-18: 17:00:00 2 [IU] via SUBCUTANEOUS
  Administered 2021-11-19 (×3): 3 [IU] via SUBCUTANEOUS
  Administered 2021-11-19: 04:00:00 2 [IU] via SUBCUTANEOUS
  Administered 2021-11-20: 07:00:00 5 [IU] via SUBCUTANEOUS

## 2021-11-17 MED ORDER — FENTANYL CITRATE (PF) 100 MCG/2ML IJ SOLN
25.0000 ug | INTRAMUSCULAR | Status: DC | PRN
Start: 2021-11-17 — End: 2021-11-18

## 2021-11-17 MED ORDER — MIDAZOLAM HCL 2 MG/2ML IJ SOLN: 1.0000 mg | INTRAMUSCULAR | Status: DC | PRN

## 2021-11-17 MED ORDER — POLYETHYLENE GLYCOL 3350 17 G PO PACK
17.0000 g | PACK | Freq: Every day | ORAL | Status: DC
  Administered 2021-11-17: 17 g
  Filled 2021-11-17 (×2): qty 1

## 2021-11-17 MED ORDER — ROCURONIUM BROMIDE 50 MG/5ML IV SOLN
INTRAVENOUS | Status: DC | PRN
  Administered 2021-11-17: 100 mg via INTRAVENOUS

## 2021-11-17 MED ORDER — NOREPINEPHRINE 4 MG/250ML-% IV SOLN
0.0000 ug/min | INTRAVENOUS | Status: DC
  Administered 2021-11-17: 4 ug/min via INTRAVENOUS
  Administered 2021-11-17: 10 ug/min via INTRAVENOUS
  Administered 2021-11-17: 8 ug/min via INTRAVENOUS
  Filled 2021-11-17 (×2): qty 250

## 2021-11-17 MED ORDER — FENTANYL CITRATE (PF) 100 MCG/2ML IJ SOLN: 25.0000 ug | INTRAMUSCULAR | Status: DC | PRN

## 2021-11-17 MED ORDER — FENTANYL CITRATE PF 50 MCG/ML IJ SOSY
25.0000 ug | PREFILLED_SYRINGE | INTRAMUSCULAR | Status: DC | PRN
  Administered 2021-11-17: 50 ug via INTRAVENOUS

## 2021-11-17 MED ORDER — DEXTROSE 50 % IV SOLN
INTRAVENOUS | Status: AC
  Administered 2021-11-17: 50 mL
  Filled 2021-11-17: qty 50

## 2021-11-17 MED ORDER — DOCUSATE SODIUM 50 MG/5ML PO LIQD
100.0000 mg | Freq: Two times a day (BID) | ORAL | Status: DC
  Administered 2021-11-17 (×2): 100 mg
  Filled 2021-11-17 (×3): qty 10

## 2021-11-17 MED ORDER — ETOMIDATE 2 MG/ML IV SOLN
INTRAVENOUS | Status: DC | PRN
  Administered 2021-11-17: 10 mg via INTRAVENOUS

## 2021-11-17 MED ORDER — BETHANECHOL CHLORIDE 10 MG PO TABS
10.0000 mg | ORAL_TABLET | Freq: Three times a day (TID) | ORAL | Status: DC
  Administered 2021-11-17: 10 mg

## 2021-11-17 MED ORDER — CHLORHEXIDINE GLUCONATE 0.12% ORAL RINSE (MEDLINE KIT)
15.0000 mL | Freq: Two times a day (BID) | OROMUCOSAL | Status: DC
  Administered 2021-11-17 – 2021-11-20 (×6): 15 mL via OROMUCOSAL

## 2021-11-17 MED ORDER — SODIUM CHLORIDE 0.9% FLUSH: 10.0000 mL | INTRAVENOUS | Status: DC | PRN

## 2021-11-17 MED ORDER — HEPARIN SODIUM (PORCINE) 5000 UNIT/ML IJ SOLN
5000.0000 [IU] | Freq: Three times a day (TID) | INTRAMUSCULAR | Status: DC
  Administered 2021-11-17 – 2021-11-20 (×10): 5000 [IU] via SUBCUTANEOUS
  Filled 2021-11-17 (×8): qty 1

## 2021-11-17 MED ORDER — BETHANECHOL CHLORIDE 10 MG PO TABS: 10.0000 mg | ORAL_TABLET | Freq: Three times a day (TID) | ORAL | Status: DC

## 2021-11-17 MED ORDER — PROPOFOL 1000 MG/100ML IV EMUL: 5.0000 ug/kg/min | INTRAVENOUS | Status: DC

## 2021-11-17 MED ORDER — FENTANYL CITRATE PF 50 MCG/ML IJ SOSY
25.0000 ug | PREFILLED_SYRINGE | Freq: Once | INTRAMUSCULAR | Status: AC
  Administered 2021-11-17: 25 ug via INTRAVENOUS
  Filled 2021-11-17: qty 1

## 2021-11-17 MED ORDER — SODIUM CHLORIDE 0.9% FLUSH
10.0000 mL | Freq: Two times a day (BID) | INTRAVENOUS | Status: DC
  Administered 2021-11-17: 20 mL
  Administered 2021-11-18 (×2): 10 mL

## 2021-11-17 MED ORDER — FENTANYL CITRATE PF 50 MCG/ML IJ SOSY
25.0000 ug | PREFILLED_SYRINGE | INTRAMUSCULAR | Status: DC | PRN
  Filled 2021-11-17: qty 1

## 2021-11-17 MED ORDER — ROSUVASTATIN CALCIUM 5 MG PO TABS
5.0000 mg | ORAL_TABLET | Freq: Every day | ORAL | Status: DC
  Administered 2021-11-17: 5 mg
  Filled 2021-11-17 (×2): qty 1
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
## 2021-11-17 NOTE — Progress Notes (Signed)
CCM family communication    Family (wife and niece who is RRT at [facility]) updated at bedside.   Family very concerned about possible cholecystitis. Exam and history less c/w this, but does have some GI sx -- I will order a RUQ [place] Family also inquiring as to why it is felt pt may have PNA. CXR reviewed with family.  Discussed failed SBTs today and plan to reassess in [place] MSN, AGACNP-BC [place] 11/17/2021, 4:54 PM
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
## 2021-11-17 NOTE — ED Triage Notes (Signed)
Pt by [facility] EMS as unresponsive. Per wife, "suffering from congestion/flu-like symptoms recently," initially called out for dehydration. Pt unresponsive en route, afib RVR on monitor. BVM on arrival, unresponsive.   199/100 HR 120 262 CBG, hx DM

## 2021-11-17 NOTE — Progress Notes (Signed)
Pharmacy Antibiotic Note  Tim Day is a 70 y.o. male admitted on 11/17/2021 with pneumonia.  Pharmacy has been consulted for Unasyn dosing. WBC 19.2. Renal function ok.   Plan: Unasyn 3g IV q8h Trend WBC, temp, renal function  F/U infectious work-up   Height: 5\' 11"  (180.3 cm) IBW/kg (Calculated) : 75.3  Temp (24hrs), Avg:97.6 F (36.4 C), Min:97.6 F (36.4 C), Max:97.6 F (36.4 C)  Recent Labs  Lab 11/17/21 0019  WBC 19.2*  CREATININE 1.02  LATICACIDVEN 1.6    CrCl cannot be calculated (Unknown ideal weight.).    No Known Allergies  Narda Bonds, PharmD, BCPS Clinical Pharmacist Phone: [phone]

## 2021-11-17 NOTE — Progress Notes (Signed)
NAME:  Tim Day, MRN:  [id number], DOB:  Oct 13, 1951, LOS: 0 ADMISSION DATE:  11/17/2021, CONSULTATION DATE:  12/17 REFERRING MD:  Dr. Melvyn Novas, CHIEF COMPLAINT: Unresponsive; acute resp failure   History of Present Illness:  Patient is a 70 year old male with pertinent PMH of chronic hypoxia on 2-3 L (patient of Dr. Melvyn Novas), DMT2, HTN, HLD presents to [facility] ED on 12/17 for unresponsiveness.  Patient's wife states patient has been experiencing viral symptoms including N/V/D and fatigue.  Patient's daughter has had similar symptoms relating it to GI bug.  Patient's wife states that her husband threw up on 12/16 around 6 PM.  He was wanting to take more naps and sleeping most of yesterday.  She called EMS due to lack of responsiveness.  Upon arrival to [facility] ED on 12/17, patient remains unresponsive with EMS and is being manually ventilated.  GCS 3.  Patient intubated.  Patient hypotensive.  Given IV fluids and started on levo.  CXR with questionable infiltrate LLL; 1.2 cm left apical nodule.  CT head: no acute intracranial process; encephalomalacia in right frontal lobe which appears chronic.  ABG postintubation: 7.32, 59, 619, 31.  Glucose 275.  WBC 19.2.  COVID flu negative.  UDS, ethanol, ammonia pending.  PCCM consulted for ICU admission.   Pertinent  Medical History   Past Medical History:  Diagnosis Date   Acute respiratory disease    Atypical mole 12/30/2012   severe left post shoulder tx exc   Candidiasis of urogenital sites    Diabetes mellitus    Diffuse myofascial pain syndrome    Esophageal reflux    Hypertension    Impacted cerumen of right ear    Melanoma ([place]) 06/14/2011   left ear mohs   Mixed hyperlipidemia    MM (malignant melanoma of skin) ([place]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   Primary insomnia    Seborrheic dermatitis, unspecified    Squamous cell carcinoma of skin 06/14/2011   left forearm medial cx3 43fu   Thrombocytopenia, unspecified ([place])       Significant Hospital Events: Including procedures, antibiotic start and stop dates in addition to other pertinent events   12/17: [facility]; intubated for unresponsiveness  Interim History / Subjective:  Patient mental status is improved, he is following commands Remains on mechanical ventilator Still requiring vasopressors with Levophed  Objective   Blood pressure 115/75, pulse 71, temperature 100.2 F (37.9 C), temperature source Oral, resp. rate (!) 23, height 5\' 11"  (1.803 m), weight 90.1 kg, SpO2 98 %.    Vent Mode: PRVC FiO2 (%):  [40 %-100 %] 40 % Set Rate:  [20 bmp-24 bmp] 24 bmp Vt Set:  [600 mL] 600 mL PEEP:  [5 cmH20] 5 cmH20 Plateau Pressure:  [25 cmH20-29 cmH20] 28 cmH20   Intake/Output Summary (Last 24 hours) at 11/17/2021 1335 Last data filed at 11/17/2021 1325 Gross per 24 hour  Intake 211.15 ml  Output --  Net 211.15 ml   Filed Weights   11/17/21 1325  Weight: 90.1 kg    Examination:  Physical exam: General: Crtitically ill-appearing male, orally intubated HEENT: [place]/AT, eyes anicteric.  ETT and OGT in place Neuro: Opens eyes with vocal stimuli, following simple commands Chest: Bilateral basal crackles right more than left, no wheezes or rhonchi Heart: Regular rate and rhythm, no murmurs or gallops Abdomen: Soft, nontender, nondistended, bowel sounds present Skin: No rash   Resolved Hospital Problem list     Assessment & Plan:  Acute  on chronic hypoxic/hypercapnic respiratory failure  Acute respiratory acidosis Possible Aspiration Pneumonia Septic shock Continue lung protective ventilation Vent setting was adjusted to clear hypercapnia to improve respiratory acidosis Trend ABGs Continue antibiotics with Unasyn Titrate FiO2 and PEEP with O2 sat goal 88-92% Minimize sedation with RASS goal 0/-1 Follow-up cultures Continue vasopressors with MAP goal 65 Continue IV fluids  Acute metabolic encephalopathy due to hypercapnia CT head  no acute changes; possibly hypercarbia related Minimize sedation  Shock liver LFTs are elevated due to shock Repeat LFTs in am  Possible viral gastritis: hx of N/V/D and daughter recently sick with similar symptoms Continue supportive care  Hyperkalemia Serum K is 5.2, will closely monitor  1.2 cm Left Apical lung nodule Hx of RLL nodule: Patient of Dr. Melvyn Novas CT chest scheduled for next week Close out patient follow up  DMT2 A1c 6.3 SSI and CBG monitoring  Depression/anxiety We will hold home Xanax  Best Practice (right click and "Reselect all SmartList Selections" daily)   Diet/type: NPO w/ meds via tube DVT prophylaxis: prophylactic heparin  GI prophylaxis: PPI Lines: Central line Foley:  N/A Code Status:  full code Last date of multidisciplinary goals of care discussion [12/17 spoke with wife at bedside and updated on plan of care, decision was to continue full aggressive Care]  Labs   CBC: Recent Labs  Lab 11/17/21 0019 11/17/21 0131 11/17/21 0248 11/17/21 0431  WBC 19.2*  --  10.6*  --   NEUTROABS 14.7*  --  9.8*  --   HGB 16.4 16.0 14.1 14.6  HCT 55.6* 47.0 45.1 43.0  MCV 96.2  --  91.7  --   PLT 246  --  170  --     Basic Metabolic Panel: Recent Labs  Lab 11/17/21 0019 11/17/21 0131 11/17/21 0248 11/17/21 0431  NA 139 138 135 137  K 4.8 4.5 5.2* 4.6  CL 104  --  98  --   CO2 21*  --  23  --   GLUCOSE 275*  --  291*  --   BUN 35*  --  34*  --   CREATININE 1.02  --  1.19  --   CALCIUM 8.3*  --  8.0*  --   MG  --   --  1.8  --    GFR: Estimated Creatinine Clearance: 63.3 mL/min (by C-G formula based on SCr of 1.19 mg/dL). Recent Labs  Lab 11/17/21 0019 11/17/21 0248  PROCALCITON  --  0.73  WBC 19.2* 10.6*  LATICACIDVEN 1.6  --     Liver Function Tests: Recent Labs  Lab 11/17/21 0019 11/17/21 0248  AST QUANTITY NOT SUFFICIENT, UNABLE TO PERFORM TEST 61*  ALT QUANTITY NOT SUFFICIENT, UNABLE TO PERFORM TEST 52*  ALKPHOS 59 50   BILITOT QUANTITY NOT SUFFICIENT, UNABLE TO PERFORM TEST 1.3*  PROT 6.7 5.7*  ALBUMIN 3.7 3.0*   No results for input(s): LIPASE, AMYLASE in the last 168 hours. Recent Labs  Lab 11/17/21 0159  AMMONIA 32    ABG    Component Value Date/Time   PHART 7.478 (H) 11/17/2021 0431   PCO2ART 32.5 11/17/2021 0431   PO2ART 140 (H) 11/17/2021 0431   HCO3 24.2 11/17/2021 0431   TCO2 25 11/17/2021 0431   ACIDBASEDEF 1.1 04/09/2021 2012   O2SAT 99.0 11/17/2021 0431     Coagulation Profile: Recent Labs  Lab 11/17/21 0019  INR 1.4*    Cardiac Enzymes: No results for input(s): CKTOTAL, CKMB, CKMBINDEX, TROPONINI in the last  168 hours.  HbA1C: Hgb A1c MFr Bld  Date/Time Value Ref Range Status  11/17/2021 02:37 AM 6.3 (H) 4.8 - 5.6 % Final    Comment:    (NOTE) Pre diabetes:          5.7%-6.4%  Diabetes:              >6.4%  Glycemic control for   <7.0% adults with diabetes   04/09/2021 04:10 PM 7.1 (H) 4.8 - 5.6 % Final    Comment:    (NOTE) Pre diabetes:          5.7%-6.4%  Diabetes:              >6.4%  Glycemic control for   <7.0% adults with diabetes     CBG: Recent Labs  Lab 11/17/21 0324 11/17/21 0814 11/17/21 1329  GLUCAP 269* 153* 66*    Critical care time:     Total critical care time: 34 minutes  Performed by: Kyle care time was exclusive of separately billable procedures and treating other patients.   Critical care was necessary to treat or prevent imminent or life-threatening deterioration.   Critical care was time spent personally by me on the following activities: development of treatment plan with patient and/or surrogate as well as nursing, discussions with consultants, evaluation of patient's response to treatment, examination of patient, obtaining history from patient or surrogate, ordering and performing treatments and interventions, ordering and review of laboratory studies, ordering and review of radiographic studies,  pulse oximetry and re-evaluation of patient's condition.   Jacky Kindle MD [place] [facility] See Amion for pager If no response to pager, please call [phone] until 7pm After 7pm, Please call E-link [phone]

## 2021-11-17 NOTE — Progress Notes (Signed)
Pt transported form [facility] to 3M05 without any complications, RN at beside, RT will continue to monitor.

## 2021-11-17 NOTE — ED Notes (Signed)
Water given to pt's visitor

## 2021-11-17 NOTE — ED Notes (Signed)
Checked patien cbg it was 55 notified RN Ryan of blood sugar patient is resting with family at bedside
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
## 2021-11-17 NOTE — ED Notes (Signed)
Wife & brother at bedside, updated on POC

## 2021-11-17 NOTE — ED Notes (Addendum)
Pt's hands seen twitching and moving around, indicating an increase in agitation on ventilator. This RN spoke with E-Link provider on the phone, requesting sedation drip for this change in behavior. Per E-Link staff, plan to order sedation drip that will not compromise/decrease pt's blood pressure.

## 2021-11-18 LAB — URINE CULTURE: Culture: <10000 — AB

## 2021-11-18 LAB — CBC
HCT: 40.3 % (ref 39.0–52.0)
Hemoglobin: 12.7 g/dL — ABNORMAL LOW (ref 13.0–17.0)
MCH: 28 pg (ref 26.0–34.0)
MCHC: 31.5 g/dL (ref 30.0–36.0)
MCV: 89 fL (ref 80.0–100.0)
Platelets: 152 10*3/uL (ref 150–400)
RBC: 4.53 MIL/uL (ref 4.22–5.81)
RDW: 15.5 % (ref 11.5–15.5)
WBC: 6.5 10*3/uL (ref 4.0–10.5)
nRBC: 0 % (ref 0.0–0.2)

## 2021-11-18 LAB — GLUCOSE, CAPILLARY
Glucose-Capillary: 67 mg/dL — ABNORMAL LOW (ref 70–99)
Glucose-Capillary: 126 mg/dL — ABNORMAL HIGH (ref 70–99)
Glucose-Capillary: 84 mg/dL (ref 70–99)
Glucose-Capillary: 97 mg/dL (ref 70–99)
Glucose-Capillary: 124 mg/dL — ABNORMAL HIGH (ref 70–99)
Glucose-Capillary: 137 mg/dL — ABNORMAL HIGH (ref 70–99)
Glucose-Capillary: 109 mg/dL — ABNORMAL HIGH (ref 70–99)
Glucose-Capillary: 120 mg/dL — ABNORMAL HIGH (ref 70–99)

## 2021-11-18 LAB — PROCALCITONIN: Procalcitonin: 6.66 ng/mL

## 2021-11-18 LAB — PHOSPHORUS: Phosphorus: 2.6 mg/dL (ref 2.5–4.6)

## 2021-11-18 LAB — BASIC METABOLIC PANEL
Anion gap: 8 (ref 5–15)
BUN: 21 mg/dL (ref 8–23)
CO2: 28 mmol/L (ref 22–32)
Calcium: 8.2 mg/dL — ABNORMAL LOW (ref 8.9–10.3)
Chloride: 104 mmol/L (ref 98–111)
Creatinine, Ser: 0.99 mg/dL (ref 0.61–1.24)
GFR, Estimated: >60 mL/min (ref >60)
Glucose, Bld: 81 mg/dL (ref 70–99)
Potassium: 2.9 mmol/L — ABNORMAL LOW (ref 3.5–5.1)
Sodium: 140 mmol/L (ref 135–145)

## 2021-11-18 MED ORDER — K PHOS MONO-SOD PHOS DI & MONO 155-852-130 MG PO TABS
500.0000 mg | ORAL_TABLET | Freq: Once | ORAL | Status: AC
  Administered 2021-11-18: 15:00:00 500 mg via ORAL
  Filled 2021-11-18: qty 2

## 2021-11-18 MED ORDER — DEXTROSE 50 % IV SOLN
12.5000 g | INTRAVENOUS | Status: AC
  Administered 2021-11-18: 03:00:00 12.5 g via INTRAVENOUS

## 2021-11-18 MED ORDER — DOCUSATE SODIUM 100 MG PO CAPS
100.0000 mg | ORAL_CAPSULE | Freq: Two times a day (BID) | ORAL | Status: DC
  Administered 2021-11-18 – 2021-11-19 (×4): 100 mg via ORAL
  Filled 2021-11-18 (×5): qty 1

## 2021-11-18 MED ORDER — PANTOPRAZOLE SODIUM 40 MG PO TBEC
40.0000 mg | DELAYED_RELEASE_TABLET | Freq: Every day | ORAL | Status: DC
  Administered 2021-11-18 – 2021-11-19 (×2): 40 mg via ORAL
  Filled 2021-11-18 (×2): qty 1

## 2021-11-18 MED ORDER — ROSUVASTATIN CALCIUM 5 MG PO TABS
5.0000 mg | ORAL_TABLET | Freq: Every day | ORAL | Status: DC
  Administered 2021-11-18 – 2021-11-20 (×3): 5 mg via ORAL
  Filled 2021-11-18 (×3): qty 1

## 2021-11-18 MED ORDER — NAPHAZOLINE-GLYCERIN 0.012-0.25 % OP SOLN
2.0000 [drp] | Freq: Four times a day (QID) | OPHTHALMIC | Status: DC | PRN
  Filled 2021-11-18: qty 15

## 2021-11-18 MED ORDER — BACITRACIN-POLYMYXIN B 500-10000 UNIT/GM OP OINT
TOPICAL_OINTMENT | Freq: Two times a day (BID) | OPHTHALMIC | Status: DC
  Administered 2021-11-18: 1 via OPHTHALMIC
  Filled 2021-11-18: qty 3.5

## 2021-11-18 MED ORDER — ASPIRIN 81 MG PO CHEW
81.0000 mg | CHEWABLE_TABLET | Freq: Every day | ORAL | Status: DC
  Administered 2021-11-18 – 2021-11-20 (×3): 81 mg via ORAL
  Filled 2021-11-18 (×3): qty 1

## 2021-11-18 MED ORDER — POLYETHYLENE GLYCOL 3350 17 G PO PACK
17.0000 g | PACK | Freq: Every day | ORAL | Status: DC
  Administered 2021-11-18 – 2021-11-19 (×2): 17 g via ORAL
  Filled 2021-11-18 (×3): qty 1

## 2021-11-18 MED ORDER — POTASSIUM CHLORIDE 10 MEQ/100ML IV SOLN
10.0000 meq | INTRAVENOUS | Status: AC
  Administered 2021-11-18 (×4): 10 meq via INTRAVENOUS
  Filled 2021-11-18 (×4): qty 100

## 2021-11-18 MED ORDER — BETHANECHOL CHLORIDE 10 MG PO TABS
10.0000 mg | ORAL_TABLET | Freq: Three times a day (TID) | ORAL | Status: DC
  Administered 2021-11-18 – 2021-11-20 (×6): 10 mg via ORAL
  Filled 2021-11-18 (×9): qty 1

## 2021-11-18 MED ORDER — SERTRALINE HCL 25 MG PO TABS
50.0000 mg | ORAL_TABLET | Freq: Every day | ORAL | Status: DC
  Administered 2021-11-18 – 2021-11-20 (×3): 50 mg via ORAL
  Filled 2021-11-18: qty 1
  Filled 2021-11-18 (×2): qty 2

## 2021-11-18 NOTE — Progress Notes (Addendum)
NAME:  Tim Day, MRN:  [id number], DOB:  1951-01-01, LOS: 1 ADMISSION DATE:  11/17/2021, CONSULTATION DATE:  12/17 REFERRING MD:  Dr. Melvyn Novas, CHIEF COMPLAINT: Unresponsive; acute resp failure   History of Present Illness:  Patient is a 70 year old male with pertinent PMH of chronic hypoxia on 2-3 L (patient of Dr. Melvyn Novas), DMT2, HTN, HLD presents to [facility] ED on 12/17 for unresponsiveness.  Patient's wife states patient has been experiencing viral symptoms including N/V/D and fatigue.  Patient's daughter has had similar symptoms relating it to GI bug.  Patient's wife states that her husband threw up on 12/16 around 6 PM.  He was wanting to take more naps and sleeping most of yesterday.  She called EMS due to lack of responsiveness.  Upon arrival to [facility] ED on 12/17, patient remains unresponsive with EMS and is being manually ventilated.  GCS 3.  Patient intubated.  Patient hypotensive.  Given IV fluids and started on levo.  CXR with questionable infiltrate LLL; 1.2 cm left apical nodule.  CT head: no acute intracranial process; encephalomalacia in right frontal lobe which appears chronic.  ABG postintubation: 7.32, 59, 619, 31.  Glucose 275.  WBC 19.2.  COVID flu negative.  UDS, ethanol, ammonia pending.  PCCM consulted for ICU admission.   Pertinent  Medical History   Past Medical History:  Diagnosis Date   Acute respiratory disease    Atypical mole 12/30/2012   severe left post shoulder tx exc   Candidiasis of urogenital sites    Diabetes mellitus    Diffuse myofascial pain syndrome    Esophageal reflux    Hypertension    Impacted cerumen of right ear    Melanoma ([place]) 06/14/2011   left ear mohs   Mixed hyperlipidemia    MM (malignant melanoma of skin) ([place]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   Primary insomnia    Seborrheic dermatitis, unspecified    Squamous cell carcinoma of skin 06/14/2011   left forearm medial cx3 44fu   Thrombocytopenia, unspecified ([place])       Significant Hospital Events: Including procedures, antibiotic start and stop dates in addition to other pertinent events   12/17: admitted to [facility]; intubated for unresponsiveness. Failed multiple SBTs 12/18 working toward extubation   Interim History / Subjective:  Awake, on 33mcg fent Doing well on PSV 5/5  Wife inquiring about adding anxiety medications   CMP and CBC not resulted   Objective   Blood pressure 120/74, pulse 63, temperature 98.2 F (36.8 C), temperature source Axillary, resp. rate (!) 24, height 5\' 11"  (1.803 m), weight 85.6 kg, SpO2 99 %.    Vent Mode: PSV;CPAP FiO2 (%):  [40 %] 40 % Set Rate:  [18 bmp-24 bmp] 24 bmp Vt Set:  [600 mL] 600 mL PEEP:  [5 cmH20] 5 cmH20 Pressure Support:  [5 cmH20] 5 cmH20 Plateau Pressure:  [15 cmH20-28 cmH20] 15 cmH20   Intake/Output Summary (Last 24 hours) at 11/18/2021 0839 Last data filed at 11/18/2021 0700 Gross per 24 hour  Intake 3850.8 ml  Output 1520 ml  Net 2330.8 ml   Filed Weights   11/17/21 1325 11/18/21 0500  Weight: 90.1 kg 85.6 kg    Examination:  General:  WDWN adult M intubated lightly sedated NAD  HEENT: NCAT ETT secure anicteric sclera. L eye injected  Neuro: Awake alert following commands.  Chest: Even unlabored on PSV. Few crackles Heart: regular, s1s2 cap refill brisk   Abdomen: soft, round. Nondistended. Mild generalized tenderness  Skin: c/d/w no rash Extremities: no acute joint deformity no cyanosis or clubbing    Resolved Hospital Problem list   Acute metabolic encephalopathy due to hypercarbia  Assessment & Plan:   Acute on chronic hypoxic / hypercarbic respiratory failure requiring intubation Suspected aspiration PNA  -WUA/SBT -hopefully extubate 12/17 -continue unasyn  Sepsic shock  due to suspected aspiration PNA, with likely component of hypovolemia due to recent GI losses - Hypotension likely further c/b acidosis, medication side effects, and  -RUQ [place] no evidence of  cholecystitis  -Unasyn -Wean pressors  -MAP > 65  -IVF  Mild transaminitis -Repeat LFTs pending  Hyperkalemia -repeat metabolic panel pending  1.2 cm Left Apical lung nodule Hx of RLL nodule: Patient of Dr. Melvyn Novas CT chest scheduled for next week Close out patient follow up  DM2 -SSI and CBG monitoring  Anxiety -Hold home Xanax  Suspected conjunctivitis -will add neomycin drops   Best Practice (right click and "Reselect all SmartList Selections" daily)   Diet/type: NPO w/ meds via tube DVT prophylaxis: prophylactic heparin  GI prophylaxis: PPI Lines: Central line Foley:  N/A Code Status:  full code Last date of multidisciplinary goals of care discussion [12/18 -- wife pt and pt brother at bedside]   Labs   CBC: Recent Labs  Lab 11/17/21 0019 11/17/21 0131 11/17/21 0248 11/17/21 0431  WBC 19.2*  --  10.6*  --   NEUTROABS 14.7*  --  9.8*  --   HGB 16.4 16.0 14.1 14.6  HCT 55.6* 47.0 45.1 43.0  MCV 96.2  --  91.7  --   PLT 246  --  170  --     Basic Metabolic Panel: Recent Labs  Lab 11/17/21 0019 11/17/21 0131 11/17/21 0248 11/17/21 0431  NA 139 138 135 137  K 4.8 4.5 5.2* 4.6  CL 104  --  98  --   CO2 21*  --  23  --   GLUCOSE 275*  --  291*  --   BUN 35*  --  34*  --   CREATININE 1.02  --  1.19  --   CALCIUM 8.3*  --  8.0*  --   MG  --   --  1.8  --    GFR: Estimated Creatinine Clearance: 63.3 mL/min (by C-G formula based on SCr of 1.19 mg/dL). Recent Labs  Lab 11/17/21 0019 11/17/21 0248 11/17/21 1413 11/18/21 0335  PROCALCITON  --  0.73  --  6.66  WBC 19.2* 10.6*  --   --   LATICACIDVEN 1.6  --  2.2*  --     Liver Function Tests: Recent Labs  Lab 11/17/21 0019 11/17/21 0248  AST QUANTITY NOT SUFFICIENT, UNABLE TO PERFORM TEST 61*  ALT QUANTITY NOT SUFFICIENT, UNABLE TO PERFORM TEST 52*  ALKPHOS 59 50  BILITOT QUANTITY NOT SUFFICIENT, UNABLE TO PERFORM TEST 1.3*  PROT 6.7 5.7*  ALBUMIN 3.7 3.0*   No results for input(s):  LIPASE, AMYLASE in the last 168 hours. Recent Labs  Lab 11/17/21 0159  AMMONIA 32    ABG    Component Value Date/Time   PHART 7.478 (H) 11/17/2021 0431   PCO2ART 32.5 11/17/2021 0431   PO2ART 140 (H) 11/17/2021 0431   HCO3 24.2 11/17/2021 0431   TCO2 25 11/17/2021 0431   ACIDBASEDEF 1.1 04/09/2021 2012   O2SAT 99.0 11/17/2021 0431     Coagulation Profile: Recent Labs  Lab 11/17/21 0019  INR 1.4*    Cardiac Enzymes: No results for  input(s): CKTOTAL, CKMB, CKMBINDEX, TROPONINI in the last 168 hours.  HbA1C: Hgb A1c MFr Bld  Date/Time Value Ref Range Status  11/17/2021 02:37 AM 6.3 (H) 4.8 - 5.6 % Final    Comment:    (NOTE) Pre diabetes:          5.7%-6.4%  Diabetes:              >6.4%  Glycemic control for   <7.0% adults with diabetes   04/09/2021 04:10 PM 7.1 (H) 4.8 - 5.6 % Final    Comment:    (NOTE) Pre diabetes:          5.7%-6.4%  Diabetes:              >6.4%  Glycemic control for   <7.0% adults with diabetes     CBG: Recent Labs  Lab 11/17/21 1915 11/17/21 2306 11/18/21 0312 11/18/21 0343 11/18/21 0722  GLUCAP 76 82 67* 126* 84    CRITICAL CARE Performed by: Cristal Generous   Total critical care time: 38 minutes  Critical care time was exclusive of separately billable procedures and treating other patients. Critical care was necessary to treat or prevent imminent or life-threatening deterioration.  Critical care was time spent personally by me on the following activities: development of treatment plan with patient and/or surrogate as well as nursing, discussions with consultants, evaluation of patient's response to treatment, examination of patient, obtaining history from patient or surrogate, ordering and performing treatments and interventions, ordering and review of laboratory studies, ordering and review of radiographic studies, pulse oximetry and re-evaluation of patient's condition.  Eliseo Gum MSN, AGACNP-BC [place] for pager 11/18/2021, 8:39 AM

## 2021-11-18 NOTE — Procedures (Signed)
Extubation Procedure Note  Patient Details:   Name: Tim Day DOB: 11/29/1951 MRN: [id number]   Airway Documentation:    Vent end date: 11/18/21 Vent end time: 0845   Evaluation  O2 sats: stable throughout Complications: No apparent complications Patient did tolerate procedure well. Bilateral Breath Sounds: Clear, Diminished   Yes  Pt was extubated per Md order and placed on 2 L [place]. Cuff leak was noted prior to exubation and no stridor post. Pt has stable vitals at this time. RT will monitor.   Ronaldo Miyamoto 11/18/2021, 8:47 AM
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
## 2021-11-19 ENCOUNTER — Inpatient Hospital Stay (HOSPITAL_COMMUNITY): Payer: HMO

## 2021-11-19 ENCOUNTER — Ambulatory Visit (HOSPITAL_COMMUNITY): Payer: HMO | Admitting: Physical Therapy

## 2021-11-19 LAB — COMPREHENSIVE METABOLIC PANEL
ALT: 32 U/L (ref 0–44)
AST: 28 U/L (ref 15–41)
Albumin: 2.6 g/dL — ABNORMAL LOW (ref 3.5–5.0)
Alkaline Phosphatase: 38 U/L (ref 38–126)
Anion gap: 5 (ref 5–15)
BUN: 18 mg/dL (ref 8–23)
CO2: 29 mmol/L (ref 22–32)
Calcium: 8.3 mg/dL — ABNORMAL LOW (ref 8.9–10.3)
Chloride: 104 mmol/L (ref 98–111)
Creatinine, Ser: 0.9 mg/dL (ref 0.61–1.24)
GFR, Estimated: >60 mL/min (ref >60)
Glucose, Bld: 158 mg/dL — ABNORMAL HIGH (ref 70–99)
Potassium: 4.2 mmol/L (ref 3.5–5.1)
Sodium: 138 mmol/L (ref 135–145)
Total Bilirubin: 0.5 mg/dL (ref 0.3–1.2)
Total Protein: 5.2 g/dL — ABNORMAL LOW (ref 6.5–8.1)

## 2021-11-19 LAB — GLUCOSE, CAPILLARY
Glucose-Capillary: 107 mg/dL — ABNORMAL HIGH (ref 70–99)
Glucose-Capillary: 109 mg/dL — ABNORMAL HIGH (ref 70–99)
Glucose-Capillary: 136 mg/dL — ABNORMAL HIGH (ref 70–99)
Glucose-Capillary: 166 mg/dL — ABNORMAL HIGH (ref 70–99)
Glucose-Capillary: 169 mg/dL — ABNORMAL HIGH (ref 70–99)
Glucose-Capillary: 181 mg/dL — ABNORMAL HIGH (ref 70–99)

## 2021-11-19 LAB — CBC
HCT: 41 % (ref 39.0–52.0)
Hemoglobin: 12.6 g/dL — ABNORMAL LOW (ref 13.0–17.0)
MCH: 28.3 pg (ref 26.0–34.0)
MCHC: 30.7 g/dL (ref 30.0–36.0)
MCV: 92.1 fL (ref 80.0–100.0)
Platelets: 128 10*3/uL — ABNORMAL LOW (ref 150–400)
RBC: 4.45 MIL/uL (ref 4.22–5.81)
RDW: 15.4 % (ref 11.5–15.5)
WBC: 5.3 10*3/uL (ref 4.0–10.5)
nRBC: 0 % (ref 0.0–0.2)

## 2021-11-19 LAB — MAGNESIUM: Magnesium: 2 mg/dL (ref 1.7–2.4)

## 2021-11-19 LAB — PROCALCITONIN: Procalcitonin: 1.88 ng/mL

## 2021-11-19 LAB — PHOSPHORUS: Phosphorus: 4.8 mg/dL — ABNORMAL HIGH (ref 2.5–4.6)

## 2021-11-19 MED ORDER — PHENOL 1.4 % MT LIQD
1.0000 | OROMUCOSAL | Status: DC | PRN
  Administered 2021-11-19: 18:00:00 1 via OROMUCOSAL
  Filled 2021-11-19: qty 177

## 2021-11-19 NOTE — Progress Notes (Signed)
Speech Language Pathology Treatment: Dysphagia  Patient Details Name: Tim Day MRN: [id number] DOB: Mar 17, 1951 Today's Date: 11/19/2021 Time: 7579-7282 SLP Time Calculation (min) (ACUTE ONLY): 10 min  Assessment / Plan / Recommendation Clinical Impression  Pt demonstrates improved quality of voice and subjectively improved glottal closure during the swallow. He consumed trials of thin liquids with no further concerns for a post-extubation dysphagia. Trials were isolated and in conjunction with solids.  Recommend advancing liquids to thin; continue regular solids. No SLP f/u is needed - our service will sign off.   HPI HPI: Pt is a 70 year old male with who presented to [facility] ED on 12/17 for unresponsiveness. [name] reported to MD pt had been experiencing viral symptoms including N/V/D and fatigue, and has an episode of emesis on 12/16. GCS 3 on arrival and pt intubated. ETT 12/17-12/18 (0845). CT head negative. CXR on admission with questionable infiltrate LLL and 1.2 cm left apical nodule. Aspiration PNA suspected by PCCM. [facility] completed twice on 12/18 and failed due to coughing. PMH: chronic hypoxia on 2-3 L, DMT2, HTN, GERD, COVID, HLD. Pt last seen by SLP in May, 2022 after 2-day intubation; a full liquid diet was initiated due to c/o odynophagia, but this was advanced to regular and thin liquids on the next date and no other SLP intervention was necessary.      SLP Plan  All goals met      Recommendations for follow up therapy are one component of a multi-disciplinary discharge planning process, led by the attending physician.  Recommendations may be updated based on patient status, additional functional criteria and insurance authorization.    Recommendations  Diet recommendations: Regular;Thin liquid Liquids provided via: Cup;Straw Medication Administration: Whole meds with liquid Supervision: Patient able to self feed                Oral Care Recommendations: Oral  care BID Follow Up Recommendations: No SLP follow up SLP Visit Diagnosis: Dysphagia, unspecified (R13.10) Plan: All goals met         Tim Day L. Tim Day, [place] CCC/SLP Acute Rehabilitation Services Office number [phone] Pager [phone]   Tim Day  11/19/2021, 12:50 PM

## 2021-11-19 NOTE — Plan of Care (Signed)

## 2021-11-19 NOTE — Progress Notes (Signed)
PROGRESS NOTE  Tim Day BTD:[id number] DOB: 1951/10/27 DOA: 11/17/2021 PCP: Tim Squibb, MD  Brief History   Patient is a 70 year old male with pertinent PMH of chronic hypoxia on 2-3 L (patient of Tim Day), DMT2, HTN, HLD presents to [facility] ED on 12/17 for unresponsiveness.  Patient's wife states patient has been experiencing viral symptoms including N/V/D and fatigue.  Patient's daughter has had similar symptoms relating it to GI bug.  Patient's wife states that her husband threw up on 12/16 around 6 PM.  He was wanting to take more naps and sleeping most of yesterday.  She called EMS due to lack of responsiveness.   Upon arrival to [facility] ED on 12/17, patient remains unresponsive with EMS and is being manually ventilated.  GCS 3.  Patient intubated.  Patient hypotensive.  Given IV fluids and started on levo.  CXR with questionable infiltrate LLL; 1.2 cm left apical nodule.  CT head: no acute intracranial process; encephalomalacia in right frontal lobe which appears chronic.  ABG postintubation: 7.32, 59, 619, 31.  Glucose 275.  WBC 19.2.  COVID flu negative.  UDS, ethanol, ammonia pending.   PCCM consulted for ICU admission. He was intubated in the ED. He experienced some low blood pressures due to sedation while in the ICU. The patient was extubated on 11/17/2021.  He has been transferred to the floor and to the care of [facility].   Consultants  PCCM  Procedures  Mechanical ventilation  Antibiotics  Unasyn  Subjective  The patient is sitting up in chair at bedside. No new complaints. His wife is at bedside.  Objective   Vitals:  Vitals:   11/19/21 0757 11/19/21 1059  BP: 115/73   Pulse: 88   Resp: 19   Temp: 97.8 F (36.6 C) 97.8 F (36.6 C)  SpO2: 94%     Exam:  Constitutional:  The patient is awake, alert, and oriented x 3. No acute distress. Eyes:  pupils and irises appear normal Left eyelid and sclera are injected and watery Right eyelid and  sclera are normal Respiratory:  No increased work of breathing. No wheezes or rhonchi Positive for rales at bases bilaterally, Lt >Rt No tactile fremitus Cardiovascular:  Regular rate and rhythm No murmurs, ectopy, or gallups. No lateral PMI. No thrills. Abdomen:  Abdomen is soft, non-tender, non-distended No hernias, masses, or organomegaly Normoactive bowel sounds.  Musculoskeletal:  No cyanosis, clubbing, or edema Skin:  No rashes, lesions, ulcers palpation of skin: no induration or nodules Neurologic:  CN 2-12 intact Sensation all 4 extremities intact Psychiatric:  Mental status Mood, affect appropriate Orientation to person, place, time  judgment and insight appear intact   I have personally reviewed the following:   Today's Data  Vitals  Lab Data  CMP CBC procalcitonin  Micro Data  Blood culture: No growth Urine culture: No significant growth  Imaging  CT head   Cardiology Data  EKG  Scheduled Meds:  aspirin  81 mg Oral Daily   bacitracin-polymyxin b   Left Eye BID   bethanechol  10 mg Oral TID   chlorhexidine gluconate (MEDLINE KIT)  15 mL Mouth Rinse BID   Chlorhexidine Gluconate Cloth  6 each Topical Daily   docusate sodium  100 mg Oral BID   heparin  5,000 Units Subcutaneous Q8H   insulin aspart  0-15 Units Subcutaneous Q4H   pantoprazole  40 mg Oral Q1200   polyethylene glycol  17 g Oral Daily   rosuvastatin  5 mg Oral Daily   sertraline  50 mg Oral Daily   sodium chloride flush  10-40 mL Intracatheter Q12H   Continuous Infusions:  ampicillin-sulbactam (UNASYN) IV 3 g (11/19/21 0622)   lactated ringers 100 mL/hr at 11/18/21 2258    Principal Problem:   Acute respiratory failure with hypercapnia (HCC) Active Problems:   Shock ([place])   LOS: 2 days   A & P  Acute on chronic hypoxic / hypercarbic respiratory failure requiring intubation Suspected aspiration PNA  -Transition from unasyn to augmentin on 11/20/2021 -Extubated  12/17 -Continue to wean O2 as possible.   Sepsic shock  due to suspected aspiration PNA, with likely component of hypovolemia due to recent GI losses - Resolved.  Mild transaminitis -Repeat LFTs pending   Hyperkalemia -Resolved. -Monitored   1.2 cm Left Apical lung nodule Hx of RLL nodule: Patient of Tim Day CT chest scheduled for next week Close out patient follow up   DM2 -SSI and CBG monitoring   Anxiety -Hold home Xanax   Suspected conjunctivitis -will add neomycin drops   I have seen and examined this patient myself. I have spent 35 minutes in his evaluation and care.  DVT Prophylaxis: heparin CODE STATUS: Full Code Family Communication: Wife was at bedside. All questions answered to the best of my ability. Disposition: patient is from home. Anticipate discharge to home.   [name], DO [facility] Direct contact: see [email or web address]  7PM-7AM contact night coverage as above 11/19/2021, 2:39 PM  LOS: 2 days

## 2021-11-19 NOTE — Evaluation (Signed)
Occupational Therapy Evaluation Patient Details Name: Tim Day MRN: [id number] DOB: 1951/03/23 Today's Date: 11/19/2021   History of Present Illness Pt is a 70 y/o male who presented with N/V/D, fatigue and decreased responsiveness. Pt intubated on 12/17, extubated on 12/18. CT neg for acute abnormalities. PMH: chronic hypoxia (O2 dependent), DM, HTN, HLD, hx of COVID   Clinical Impression   PTA, pt lives with spouse and reports typically Independent with ADLs/mobility, uses 2 L O2 at baseline. Pt presents now with deficits in strength, dynamic standing balance and cardiopulmonary tolerance requiring 3-4 L O2 to maintain sats during activity. Pt overall min guard for short mobility in room without AD and BSC transfers, Setup for UB ADLs and Min A for LB ADLs due to deficits. [name] present and endorses she can provide 24/7 assist at DC. Anticipate pt to progress well towards independence without OT follow-up at DC. Will continue to follow acutely and update DC recs as appropriate.     Recommendations for follow up therapy are one component of a multi-disciplinary discharge planning process, led by the attending physician.  Recommendations may be updated based on patient status, additional functional criteria and insurance authorization.   Follow Up Recommendations  No OT follow up    Assistance Recommended at Discharge Intermittent Supervision/Assistance  Functional Status Assessment  Patient has had a recent decline in their functional status and demonstrates the ability to make significant improvements in function in a reasonable and predictable amount of time.  Equipment Recommendations  None recommended by OT    Recommendations for Other Services       Precautions / Restrictions Precautions Precautions: Fall;Other (comment) Precaution Comments: monitor O2 (wears 2 L O2 baseline) Restrictions Weight Bearing Restrictions: No      Mobility Bed Mobility Overal bed  mobility: Needs Assistance Bed Mobility: Supine to Sit     Supine to sit: Supervision;HOB elevated          Transfers Overall transfer level: Needs assistance Equipment used: None Transfers: Sit to/from Stand;Bed to chair/wheelchair/BSC Sit to Stand: Min guard     Step pivot transfers: Min guard     General transfer comment: pivot to [facility] due to bathroom request on entry, able to stand from [facility] without assist and mobilize around bedside to recliner      Balance Overall balance assessment: Needs assistance Sitting-balance support: No upper extremity supported;Feet supported Sitting balance-Leahy Scale: Good     Standing balance support: Single extremity supported;No upper extremity supported;During functional activity Standing balance-Leahy Scale: Fair Standing balance comment: fair static standing, reaches out for support during dynamic tasks                           ADL either performed or assessed with clinical judgement   ADL Overall ADL's : Needs assistance/impaired Eating/Feeding: Set up;Sitting   Grooming: Supervision/safety;Standing   Upper Body Bathing: Set up;Sitting   Lower Body Bathing: Minimal assistance;Sit to/from stand   Upper Body Dressing : Set up;Sitting   Lower Body Dressing: Sit to/from stand;Minimal assistance   Toilet Transfer: Min Patent examiner Details (indicate cue type and reason): no use of AD, cues for hand placement Toileting- Clothing Manipulation and Hygiene: Minimal assistance;Sit to/from stand Toileting - Clothing Manipulation Details (indicate cue type and reason): assist for clothing mgmt     Functional mobility during ADLs: Min guard General ADL Comments: pt with endurance and cardiopulmonary deficits requiring increased O2 with activtiy  Vision Ability to See in Adequate Light: 0 Adequate Patient Visual Report: No change from baseline Vision Assessment?: No apparent visual  deficits     Perception     Praxis      Pertinent Vitals/Pain Pain Assessment: No/denies pain     Hand Dominance Right   Extremity/Trunk Assessment Upper Extremity Assessment Upper Extremity Assessment: Generalized weakness   Lower Extremity Assessment Lower Extremity Assessment: Defer to PT evaluation   Cervical / Trunk Assessment Cervical / Trunk Assessment: Other exceptions Cervical / Trunk Exceptions: kyphotic cervical region; has been working with OP PT to address muscle strength   Communication Communication Communication: No difficulties   Cognition Arousal/Alertness: Awake/alert Behavior During Therapy: WFL for tasks assessed/performed;Flat affect Overall Cognitive Status: Within Functional Limits for tasks assessed                                 General Comments: pleasant, follows directions, soft spoken     General Comments  SpO2 90s on 3 L O2 at rest; removed Coal for pt to wipe nose/face with desats to 82% and difficulty recovering with replacement of 3 L O2. With increase to 4 L O2, recovered to 93% and increased to 96% at rest in recliner. BP 115/73. Wife present and hands on to assist pt    Exercises     Shoulder Instructions      Home Living Family/patient expects to be discharged to:: Private residence Living Arrangements: Spouse/significant other;Children Available Help at Discharge: Family;Available 24 hours/day Type of Home: House Home Access: Stairs to enter [employer] of Steps: 6 Entrance Stairs-Rails: Right;Left Home Layout: One level     Bathroom Shower/Tub: Occupational psychologist: Standard     Home Equipment: Grab bars - tub/shower;Shower seat - built Medical sales representative (2 wheels);BSC/3in1;Hand held shower head   Additional Comments: On Home O2 2 L continuous; pt's child/in-law and granddaughter staying with pt while building their home. Wife retired summer 2022      Prior Functioning/Environment  Prior Level of Function : Independent/Modified Independent             Mobility Comments: no use of AD typically,has been going to OP PT for cervical muscle strengthening/posture (for past 3 weeks); hx of R ankle edema/blood clots s/p covid - had also seen PT for this ADLs Comments: Independent with ADLs, wife does driving and community IADLs        OT Problem List: Decreased strength;Decreased activity tolerance;Impaired balance (sitting and/or standing);Cardiopulmonary status limiting activity      OT Treatment/Interventions: Self-care/ADL training;Therapeutic exercise;Energy conservation;DME and/or AE instruction;Therapeutic activities;Patient/family education;Balance training    OT Goals(Current goals can be found in the care plan section) Acute Rehab OT Goals Patient Stated Goal: get home to 61 m/o granddaughter OT Goal Formulation: With patient Time For Goal Achievement: 12/03/21 Potential to Achieve Goals: Good ADL Goals Pt Will Perform Lower Body Dressing: with modified independence;sit to/from stand Pt Will Transfer to Toilet: with modified independence;ambulating Pt/caregiver will Perform Home Exercise Program: Increased strength;Both right and left upper extremity;With theraband;Independently;With written HEP provided Additional ADL Goal #1: Pt to increase standing activity tolerance > 7 min during functional tasks to improve ADL/mobility endurance  OT Frequency: Min 2X/week   Barriers to D/C:            Co-evaluation              AM-PAC OT "6 Clicks" Daily  Activity     Outcome Measure Help from another person eating meals?: A Little Help from another person taking care of personal grooming?: A Little Help from another person toileting, which includes using toliet, bedpan, or urinal?: A Little Help from another person bathing (including washing, rinsing, drying)?: A Little Help from another person to put on and taking off regular upper body clothing?: A  Little Help from another person to put on and taking off regular lower body clothing?: A Little 6 Click Score: 18   End of Session Equipment Utilized During Treatment: Oxygen Nurse Communication: Mobility status;Other (comment) (vitals)  Activity Tolerance: Patient tolerated treatment well Patient left: in chair;with call bell/phone within reach;with family/visitor present  OT Visit Diagnosis: Unsteadiness on feet (R26.81);Other abnormalities of gait and mobility (R26.89);Muscle weakness (generalized) (M62.81)                Time: 3383-2919 OT Time Calculation (min): 24 min Charges:  OT General Charges $OT Visit: 1 Visit OT Evaluation $OT Eval Moderate Complexity: 1 Mod OT Treatments $Self Care/Home Management : 8-22 mins  Malachy Chamber, OTR/L Acute Rehab Services Office: [phone]   Layla Maw 11/19/2021, 7:52 AM

## 2021-11-19 NOTE — Evaluation (Signed)
Physical Therapy Evaluation Patient Details Name: Tim Day MRN: [id number] DOB: [birth year] Today's Date: 11/19/2021  History of Present Illness  Pt is a 70 y/o male who presented with N/V/D, fatigue and decreased responsiveness. Pt intubated on 12/17, extubated on 12/18. CT neg for acute abnormalities. PMH: chronic hypoxia (O2 dependent), DM, HTN, HLD, hx of COVID  Clinical Impression  Pt presents to PT with decreased mobility due to decreased strength, decreased balance, and decreased functional activity tolerance. Pt well known to this therapist from extended hospitalization in 2021 and has made good recovery from that illness. Pt has good support at home and can return home with wife at DC.         Recommendations for follow up therapy are one component of a multi-disciplinary discharge planning process, led by the attending physician.  Recommendations may be updated based on patient status, additional functional criteria and insurance authorization.  Follow Up Recommendations Outpatient PT (resume OPPT)    Assistance Recommended at Discharge Intermittent Supervision/Assistance  Functional Status Assessment Patient has had a recent decline in their functional status and demonstrates the ability to make significant improvements in function in a reasonable and predictable amount of time.  Equipment Recommendations  None recommended by PT    Recommendations for Other Services       Precautions / Restrictions Precautions Precautions: Fall;Other (comment) Precaution Comments: monitor O2 (wears 2 L O2 baseline)      Mobility  Bed Mobility               General bed mobility comments: Pt up in chair    Transfers Overall transfer level: Needs assistance Equipment used: Rollator (4 wheels) Transfers: Sit to/from Stand Sit to Stand: Min guard           General transfer comment: assist for safety/lines    Ambulation/Gait Ambulation/Gait assistance: Min  guard Gait Distance (Feet): 150 Feet Assistive device: Rollator (4 wheels) Gait Pattern/deviations: Step-through pattern;Decreased stride length Gait velocity: decr Gait velocity interpretation: 1.31 - 2.62 ft/sec, indicative of limited community ambulator   General Gait Details: Assist for Armed forces logistics/support/administrative officer    Modified Rankin (Stroke Patients Only)       Balance Overall balance assessment: Needs assistance Sitting-balance support: No upper extremity supported;Feet supported Sitting balance-Leahy Scale: Good     Standing balance support: No upper extremity supported;During functional activity Standing balance-Leahy Scale: Fair                               Pertinent Vitals/Pain Pain Assessment: No/denies pain    Home Living Family/patient expects to be discharged to:: Private residence Living Arrangements: Spouse/significant other;Children Available Help at Discharge: Family;Available 24 hours/day Type of Home: House Home Access: Stairs to enter Entrance Stairs-Rails: Psychiatric nurse of Steps: 6   Home Layout: One level Home Equipment: Grab bars - tub/shower;Shower seat - built Medical sales representative (2 wheels);BSC/3in1;Hand held shower head Additional Comments: On Home O2 2 L continuous; pt's child/in-law and granddaughter staying with pt while building their home. Wife retired summer 2022    Prior Function Prior Level of Function : Independent/Modified Independent             Mobility Comments: no use of AD typically,has been going to OP PT for cervical muscle strengthening/posture (for past 3 weeks); hx of R ankle edema/blood clots s/p covid -  had also seen PT for this ADLs Comments: Independent with ADLs, wife does driving and community IADLs     Hand Dominance   Dominant Hand: Right    Extremity/Trunk Assessment   Upper Extremity Assessment Upper Extremity Assessment: Defer to OT  evaluation    Lower Extremity Assessment Lower Extremity Assessment: Generalized weakness    Cervical / Trunk Assessment Cervical / Trunk Assessment: Other exceptions Cervical / Trunk Exceptions: kyphotic cervical region; has been working with OP PT to address muscle strength  Communication   Communication: No difficulties  Cognition Arousal/Alertness: Awake/alert Behavior During Therapy: WFL for tasks assessed/performed;Flat affect Overall Cognitive Status: Within Functional Limits for tasks assessed                                          General Comments General comments (skin integrity, edema, etc.): SpO2 to 87% with amb on 2L. Incr to 4L with SpO2 >905    Exercises     Assessment/Plan    PT Assessment Patient needs continued PT services  PT Problem List Decreased strength;Decreased activity tolerance;Decreased balance;Decreased mobility       PT Treatment Interventions DME instruction;Gait training;Functional mobility training;Therapeutic activities;Therapeutic exercise;Balance training;Patient/family education    PT Goals (Current goals can be found in the Care Plan section)  Acute Rehab PT Goals Patient Stated Goal: return home PT Goal Formulation: With patient/family Time For Goal Achievement: 12/03/21 Potential to Achieve Goals: Good    Frequency Min 3X/week   Barriers to discharge        Co-evaluation               AM-PAC PT "6 Clicks" Mobility  Outcome Measure Help needed turning from your back to your side while in a flat bed without using bedrails?: None Help needed moving from lying on your back to sitting on the side of a flat bed without using bedrails?: A Little Help needed moving to and from a bed to a chair (including a wheelchair)?: A Little Help needed standing up from a chair using your arms (e.g., wheelchair or bedside chair)?: A Little Help needed to walk in hospital room?: A Little Help needed climbing 3-5 steps  with a railing? : A Little 6 Click Score: 19    End of Session Equipment Utilized During Treatment: Oxygen Activity Tolerance: Patient tolerated treatment well Patient left: in chair;with call bell/phone within reach;with family/visitor present   PT Visit Diagnosis: Other abnormalities of gait and mobility (R26.89);Muscle weakness (generalized) (M62.81)    Time: 1610-9604 PT Time Calculation (min) (ACUTE ONLY): 19 min   Charges:   PT Evaluation $PT Eval Moderate Complexity: [place] Pager [phone] Office [place] 11/19/2021, 1:57 PM

## 2021-11-20 LAB — GLUCOSE, CAPILLARY
Glucose-Capillary: 113 mg/dL — ABNORMAL HIGH (ref 70–99)
Glucose-Capillary: 205 mg/dL — ABNORMAL HIGH (ref 70–99)
Glucose-Capillary: 100 mg/dL — ABNORMAL HIGH (ref 70–99)

## 2021-11-20 LAB — LEGIONELLA PNEUMOPHILA SEROGP 1 UR AG: L. pneumophila Serogp 1 Ur Ag: NEGATIVE

## 2021-11-20 MED ORDER — AMOXICILLIN-POT CLAVULANATE 875-125 MG PO TABS: 1.0000 | ORAL_TABLET | Freq: Two times a day (BID) | ORAL | 0 refills | Status: AC

## 2021-11-20 MED ORDER — BACITRACIN-POLYMYXIN B 500-10000 UNIT/GM OP OINT: TOPICAL_OINTMENT | Freq: Two times a day (BID) | OPHTHALMIC | 0 refills | Status: AC

## 2021-11-20 MED ORDER — AMOXICILLIN-POT CLAVULANATE 875-125 MG PO TABS
1.0000 | ORAL_TABLET | Freq: Two times a day (BID) | ORAL | Status: DC
Start: 2021-11-20 — End: 2021-11-20
  Administered 2021-11-20: 10:00:00 1 via ORAL
  Filled 2021-11-20: qty 1

## 2021-11-20 NOTE — Discharge Summary (Signed)
Physician Discharge Summary   Tim Day LZJ:[id number] DOB: 09-17-1951 DOA: 11/17/2021  PCP: Celene Squibb, MD  Admit date: 11/17/2021 Discharge date: 11/20/2021   Admitted From: home Disposition:  home Discharging physician: Dwyane Dee, MD  Recommendations for Outpatient Follow-up:  Continue routine chronic management  Home Health:  Equipment/Devices:   Discharge Condition: stable CODE STATUS: Full Diet recommendation:  Diet Orders (From admission, onward)     Start     Ordered   11/20/21 0000  Diet general        11/20/21 0816   11/19/21 1247  Diet regular Room service appropriate? Yes with Assist; Fluid consistency: Thin  Diet effective now       Question Answer Comment  Room service appropriate? Yes with Assist   Fluid consistency: Thin      11/19/21 [place] Course:  Acute on chronic hypoxic / hypercarbic respiratory failure requiring intubation Suspected aspiration PNA  -Transition from unasyn to augmentin on 11/20/2021 and complete course and d/c -Extubated 12/17   Sepsic shock  due to suspected aspiration PNA, with likely component of hypovolemia due to recent GI losses - Resolved.   Mild transaminitis -resolved -due to acute illness   Hyperkalemia -Resolved.   1.2 cm Left Apical lung nodule Hx of RLL nodule: Patient of Dr. Melvyn Novas CT chest scheduled for next week Close out patient follow up   DM2 -resume home regimen at d/c    Anxiety -home meds resumed   Suspected conjunctivitis -continue eye drops at discharge        The patient's chronic medical conditions were treated accordingly per the patient's home medication regimen except as noted.  On day of discharge, patient was felt deemed stable for discharge. Patient/family member advised to call PCP or come back to ER if needed.   Principal Diagnosis: Acute respiratory failure with hypercapnia [facility])  Discharge Diagnoses: Principal Problem:   Acute  respiratory failure with hypercapnia ([place]) Active Problems:   [facility]ock [facility])   Discharge Instructions     Ambulatory referral to Physical Therapy   Complete by: As directed    Diet general   Complete by: As directed    Increase activity slowly   Complete by: As directed    No wound care   Complete by: As directed       Allergies as of 11/20/2021   No Known Allergies      Medication List     STOP taking these medications    ibuprofen 600 MG tablet Commonly known as: ADVIL   Normal Saline Flush 0.9 % Soln   triamcinolone cream 0.1 % Commonly known as: KENALOG       TAKE these medications    Accu-Chek Guide test strip Generic drug: glucose blood USE TO TEST TWICE DAILY.D   acetaminophen 500 MG tablet Commonly known as: TYLENOL Take 1,000 mg by mouth 2 (two) times daily as needed for moderate pain.   alclomethasone 0.05 % cream Commonly known as: ACLOVATE Apply topically 2 (two) times daily as needed (Rash). What changed: how much to take   ALPRAZolam 0.25 MG tablet Commonly known as: XANAX Take 0.25 mg by mouth daily as needed for anxiety.   alum & mag hydroxide-simeth 200-200-20 MG/5ML suspension Commonly known as: MAALOX/MYLANTA Take 30 mLs by mouth every 4 (four) hours as needed for indigestion.   amoxicillin-clavulanate 875-125 MG tablet Commonly known as: Augmentin Take 1 tablet by mouth 2 (  two) times daily for 7 days.   ascorbic acid 500 MG tablet Commonly known as: VITAMIN C Take 1 tablet (500 mg total) by mouth daily.   aspirin EC 81 MG tablet Take 81 mg by mouth daily. Swallow whole.   bacitracin-polymyxin b ophthalmic ointment Commonly known as: POLYSPORIN Place into both eyes 2 (two) times daily for 5 days. apply to eye every 12 hours while awake   blood glucose meter kit and supplies Kit Dispense based on patient and insurance preference. Use up to four times daily as directed. (FOR ICD-9 250.00, 250.01).   co-enzyme Q-10 50 MG  capsule Take 50 mg by mouth 2 (two) times daily.   famotidine 20 MG tablet Commonly known as: PEPCID Take 20 mg by mouth daily after supper.   Farxiga 10 MG Tabs tablet Generic drug: dapagliflozin propanediol Take 10 mg by mouth daily.   Fiasp FlexTouch 100 UNIT/ML FlexTouch Pen Generic drug: insulin aspart Inject 2-8 Units into the skin every evening.   furosemide 20 MG tablet Commonly known as: LASIX Take 20 mg by mouth daily as needed for edema.   metFORMIN 1000 MG tablet Commonly known as: GLUCOPHAGE Take 1,000 mg by mouth 2 (two) times daily with a meal.   multivitamin capsule Take 1 capsule by mouth daily.   olmesartan-hydrochlorothiazide 20-12.5 MG tablet Commonly known as: BENICAR HCT Take 1 tablet by mouth daily.   pantoprazole 40 MG tablet Commonly known as: PROTONIX Take 40 mg by mouth daily.   Pen Needles 32G X 4 MM Misc 1 Package by Does not apply route 4 (four) times daily -  before meals and at bedtime.   rosuvastatin 5 MG tablet Commonly known as: CRESTOR Take 5 mg by mouth daily.   sertraline 50 MG tablet Commonly known as: ZOLOFT Take 50 mg by mouth at bedtime.   SINEX REGULAR NA Place 1 spray into the nose daily as needed (congestion).   Toujeo Max SoloStar 300 UNIT/ML Solostar Pen Generic drug: insulin glargine (2 Unit Dial) Inject 30 Units into the skin in the morning.   Vitamin D3 125 MCG (5000 UT) Caps Take 5,000 Units by mouth daily.   zinc sulfate 220 (50 Zn) MG capsule Take 1 capsule (220 mg total) by mouth daily.   zolpidem 10 MG tablet Commonly known as: AMBIEN Take 10 mg by mouth at bedtime.        No Known Allergies  Consultations: PCCM  Discharge Exam: BP 130/73    Pulse 80    Temp 98.5 F (36.9 C) (Oral)    Resp (!) 31    Ht _0  (1.803 m)    Wt 89.1 kg    SpO2 95%    BMI 27.40 kg/m  Physical Exam Constitutional:      General: He is not in acute distress.    Appearance: Normal appearance.  HENT:      Head: Normocephalic and atraumatic.     Mouth/Throat:     Mouth: Mucous membranes are moist.  Eyes:     Extraocular Movements: Extraocular movements intact.  Cardiovascular:     Rate and Rhythm: Normal rate and regular rhythm.     Heart sounds: Normal heart sounds.  Pulmonary:     Effort: Pulmonary effort is normal. No respiratory distress.     Breath sounds: Normal breath sounds. No wheezing.  Abdominal:     General: Bowel sounds are normal. There is no distension.     Palpations: Abdomen is soft.  Tenderness: There is no abdominal tenderness.  Musculoskeletal:        General: Normal range of motion.     Cervical back: Normal range of motion and neck supple.  Skin:    General: Skin is warm and dry.  Neurological:     General: No focal deficit present.     Mental Status: He is alert.  Psychiatric:        Mood and Affect: Mood normal.        Behavior: Behavior normal.     The results of significant diagnostics from this hospitalization (including imaging, microbiology, ancillary and laboratory) are listed below for reference.   Microbiology: Recent Results (from the past 240 hour(s))  Urine Culture     Status: Abnormal   Collection Time: 11/17/21 12:24 AM   Specimen: Urine, Clean Catch  Result Value Ref Range Status   Specimen Description URINE, CLEAN CATCH  Final   Special Requests NONE  Final   Culture (A)  Final    <10,000 COLONIES/mL INSIGNIFICANT GROWTH Performed at [facility], [address], [place], [place] [address]    Report Status 11/18/2021 FINAL  Final  Resp Panel by RT-PCR (Flu A&B, Covid) Nasopharyngeal Swab     Status: None   Collection Time: 11/17/21 12:46 AM   Specimen: Nasopharyngeal Swab; Nasopharyngeal(NP) swabs in vial transport medium  Result Value Ref Range Status   SARS Coronavirus 2 by RT PCR NEGATIVE NEGATIVE Final    Comment: (NOTE) SARS-CoV-2 target nucleic acids are NOT DETECTED.  The SARS-CoV-2 RNA is generally detectable in  upper respiratory specimens during the acute phase of infection. The lowest concentration of SARS-CoV-2 viral copies this assay can detect is 138 copies/mL. A negative result does not preclude SARS-Cov-2 infection and should not be used as the sole basis for treatment or other patient management decisions. A negative result may occur with  improper specimen collection/handling, submission of specimen other than nasopharyngeal swab, presence of viral mutation(s) within the areas targeted by this assay, and inadequate number of viral copies(<138 copies/mL). A negative result must be combined with clinical observations, patient history, and epidemiological information. The expected result is Negative.  Fact Sheet for Patients:  EntrepreneurPulse.com.au  Fact Sheet for Healthcare Providers:  IncredibleEmployment.be  This test is no t yet approved or cleared by the [place] FDA and  has been authorized for detection and/or diagnosis of SARS-CoV-2 by FDA under an Emergency Use Authorization (EUA). This EUA will remain  in effect (meaning this test can be used) for the duration of the COVID-19 declaration under Section 564(b)(1) of the Act, 21 U.S.C.section 360bbb-3(b)(1), unless the authorization is terminated  or revoked sooner.       Influenza A by PCR NEGATIVE NEGATIVE Final   Influenza B by PCR NEGATIVE NEGATIVE Final    Comment: (NOTE) The Xpert Xpress SARS-CoV-2/FLU/RSV plus assay is intended as an aid in the diagnosis of influenza from Nasopharyngeal swab specimens and should not be used as a sole basis for treatment. Nasal washings and aspirates are unacceptable for Xpert Xpress SARS-CoV-2/FLU/RSV testing.  Fact Sheet for Patients: EntrepreneurPulse.com.au  Fact Sheet for Healthcare Providers: IncredibleEmployment.be  This test is not yet approved or cleared by the [place] FDA and has been  authorized for detection and/or diagnosis of SARS-CoV-2 by FDA under an Emergency Use Authorization (EUA). This EUA will remain in effect (meaning this test can be used) for the duration of the COVID-19 declaration under Section 564(b)(1) of the Act,  21 U.S.C. section 360bbb-3(b)(1), unless the authorization is terminated or revoked.  Performed at [facility], [place] [address], [place], [place] [address]   Culture, blood (single)     Status: None (Preliminary result)   Collection Time: 11/17/21  1:49 AM   Specimen: BLOOD  Result Value Ref Range Status   Specimen Description BLOOD RIGHT ARM  Final   Special Requests   Final    BOTTLES DRAWN AEROBIC AND ANAEROBIC Blood Culture results may not be optimal due to an inadequate volume of blood received in culture bottles   Culture   Final    NO GROWTH 3 DAYS Performed at [facility], [place] [address], [place], [place] [address]    Report Status PENDING  Incomplete  MRSA Next Gen by PCR, Nasal     Status: None   Collection Time: 11/17/21  2:10 PM   Specimen: Nasal Mucosa; Nasal Swab  Result Value Ref Range Status   MRSA by PCR Next Gen NOT DETECTED NOT DETECTED Final    Comment: (NOTE) The GeneXpert MRSA Assay (FDA approved for NASAL specimens only), is one component of a comprehensive MRSA colonization surveillance program. It is not intended to diagnose MRSA infection nor to guide or monitor treatment for MRSA infections. Test performance is not FDA approved in patients less than 23 years old. Performed at [facility], [place] [address], [place], [place] [address]      Labs: BNP (last 3 results) Recent Labs    04/09/21 1149  BNP 096.2*   Basic Metabolic Panel: Recent Labs  Lab 11/17/21 0019 11/17/21 0131 11/17/21 0248 11/17/21 0431 11/18/21 0805 11/19/21 0042  NA 139 138 135 137 140 138  K 4.8 4.5 5.2* 4.6 2.9* 4.2  CL 104  --  98  --  104 104  CO2 21*  --  23  --  28 29  GLUCOSE 275*  --  291*  --   81 158*  BUN 35*  --  34*  --  21 18  CREATININE 1.02  --  1.19  --  0.99 0.90  CALCIUM 8.3*  --  8.0*  --  8.2* 8.3*  MG  --   --  1.8  --   --  2.0  PHOS  --   --   --   --  2.6 4.8*   Liver Function Tests: Recent Labs  Lab 11/17/21 0019 11/17/21 0248 11/19/21 0042  AST QUANTITY NOT SUFFICIENT, UNABLE TO PERFORM TEST 61* 28  ALT QUANTITY NOT SUFFICIENT, UNABLE TO PERFORM TEST 52* 32  ALKPHOS 59 50 38  BILITOT QUANTITY NOT SUFFICIENT, UNABLE TO PERFORM TEST 1.3* 0.5  PROT 6.7 5.7* 5.2*  ALBUMIN 3.7 3.0* 2.6*   No results for input(s): LIPASE, AMYLASE in the last 168 hours. Recent Labs  Lab 11/17/21 0159  AMMONIA 32   CBC: Recent Labs  Lab 11/17/21 0019 11/17/21 0131 11/17/21 0248 11/17/21 0431 11/18/21 0805 11/19/21 0042  WBC 19.2*  --  10.6*  --  6.5 5.3  NEUTROABS 14.7*  --  9.8*  --   --   --   HGB 16.4 16.0 14.1 14.6 12.7* 12.6*  HCT 55.6* 47.0 45.1 43.0 40.3 41.0  MCV 96.2  --  91.7  --  89.0 92.1  PLT 246  --  170  --  152 128*   Cardiac Enzymes: No results for input(s): CKTOTAL, CKMB, CKMBINDEX, TROPONINI in the last 168 hours. BNP: Invalid input(s):  POCBNP CBG: Recent Labs  Lab 11/19/21 1937 11/19/21 2347 11/20/21 0350 11/20/21 0717 11/20/21 1135  GLUCAP 181* 107* 113* 205* 100*   D-Dimer No results for input(s): DDIMER in the last 72 hours. Hgb A1c No results for input(s): HGBA1C in the last 72 hours. Lipid Profile No results for input(s): CHOL, HDL, LDLCALC, TRIG, CHOLHDL, LDLDIRECT in the last 72 hours. Thyroid function studies No results for input(s): TSH, T4TOTAL, T3FREE, THYROIDAB in the last 72 hours.  Invalid input(s): FREET3 Anemia work up No results for input(s): VITAMINB12, FOLATE, FERRITIN, TIBC, IRON, RETICCTPCT in the last 72 hours. Urinalysis    Component Value Date/Time   COLORURINE YELLOW 11/17/2021 0024   APPEARANCEUR CLEAR 11/17/2021 0024   LABSPEC 1.025 11/17/2021 0024   PHURINE 5.5 11/17/2021 0024   GLUCOSEU  >=500 (A) 11/17/2021 0024   HGBUR NEGATIVE 11/17/2021 0024   BILIRUBINUR NEGATIVE 11/17/2021 0024   BILIRUBINUR small (A) 04/06/2021 1435   KETONESUR 40 (A) 11/17/2021 0024   PROTEINUR 100 (A) 11/17/2021 0024   UROBILINOGEN 0.2 04/06/2021 1435   UROBILINOGEN 0.2 01/21/2015 1822   NITRITE NEGATIVE 11/17/2021 0024   LEUKOCYTESUR NEGATIVE 11/17/2021 0024   Sepsis Labs Invalid input(s): PROCALCITONIN,  WBC,  LACTICIDVEN Microbiology Recent Results (from the past 240 hour(s))  Urine Culture     Status: Abnormal   Collection Time: 11/17/21 12:24 AM   Specimen: Urine, Clean Catch  Result Value Ref Range Status   Specimen Description URINE, CLEAN CATCH  Final   Special Requests NONE  Final   Culture (A)  Final    <10,000 COLONIES/mL INSIGNIFICANT GROWTH Performed at [facility], [place], [place], [place] [address]    Report Status 11/18/2021 FINAL  Final  Resp Panel by RT-PCR (Flu A&B, Covid) Nasopharyngeal Swab     Status: None   Collection Time: 11/17/21 12:46 AM   Specimen: Nasopharyngeal Swab; Nasopharyngeal(NP) swabs in vial transport medium  Result Value Ref Range Status   SARS Coronavirus 2 by RT PCR NEGATIVE NEGATIVE Final    Comment: (NOTE) SARS-CoV-2 target nucleic acids are NOT DETECTED.  The SARS-CoV-2 RNA is generally detectable in upper respiratory specimens during the acute phase of infection. The lowest concentration of SARS-CoV-2 viral copies this assay can detect is 138 copies/mL. A negative result does not preclude SARS-Cov-2 infection and should not be used as the sole basis for treatment or other patient management decisions. A negative result may occur with  improper specimen collection/handling, submission of specimen other than nasopharyngeal swab, presence of viral mutation(s) within the areas targeted by this assay, and inadequate number of viral copies(<138 copies/mL). A negative result must be combined with clinical observations, patient  history, and epidemiological information. The expected result is Negative.  Fact Sheet for Patients:  EntrepreneurPulse.com.au  Fact Sheet for Healthcare Providers:  IncredibleEmployment.be  This test is no t yet approved or cleared by the [place] FDA and  has been authorized for detection and/or diagnosis of SARS-CoV-2 by FDA under an Emergency Use Authorization (EUA). This EUA will remain  in effect (meaning this test can be used) for the duration of the COVID-19 declaration under Section 564(b)(1) of the Act, 21 U.S.C.section 360bbb-3(b)(1), unless the authorization is terminated  or revoked sooner.       Influenza A by PCR NEGATIVE NEGATIVE Final   Influenza B by PCR NEGATIVE NEGATIVE Final    Comment: (NOTE) The Xpert Xpress SARS-CoV-2/FLU/RSV plus assay is intended as an aid in the diagnosis of influenza  from Nasopharyngeal swab specimens and should not be used as a sole basis for treatment. Nasal washings and aspirates are unacceptable for Xpert Xpress SARS-CoV-2/FLU/RSV testing.  Fact Sheet for Patients: EntrepreneurPulse.com.au  Fact Sheet for Healthcare Providers: IncredibleEmployment.be  This test is not yet approved or cleared by the [place] FDA and has been authorized for detection and/or diagnosis of SARS-CoV-2 by FDA under an Emergency Use Authorization (EUA). This EUA will remain in effect (meaning this test can be used) for the duration of the COVID-19 declaration under Section 564(b)(1) of the Act, 21 U.S.C. section 360bbb-3(b)(1), unless the authorization is terminated or revoked.  Performed at [facility], [place] [address] [place], [place] [address]   Culture, blood (single)     Status: None (Preliminary result)   Collection Time: 11/17/21  1:49 AM   Specimen: BLOOD  Result Value Ref Range Status   Specimen Description BLOOD RIGHT ARM  Final   Special Requests    Final    BOTTLES DRAWN AEROBIC AND ANAEROBIC Blood Culture results may not be optimal due to an inadequate volume of blood received in culture bottles   Culture   Final    NO GROWTH 3 DAYS Performed at [facility], [place] [address] [place], [place] [address]    Report Status PENDING  Incomplete  MRSA Next Gen by PCR, Nasal     Status: None   Collection Time: 11/17/21  2:10 PM   Specimen: Nasal Mucosa; Nasal Swab  Result Value Ref Range Status   MRSA by PCR Next Gen NOT DETECTED NOT DETECTED Final    Comment: (NOTE) The GeneXpert MRSA Assay (FDA approved for NASAL specimens only), is one component of a comprehensive MRSA colonization surveillance program. It is not intended to diagnose MRSA infection nor to guide or monitor treatment for MRSA infections. Test performance is not FDA approved in patients less than 10 years old. Performed at [facility], [place] [address], [place], [place] [address]     Procedures/Studies: CT HEAD WO CONTRAST (5MM)  Result Date: 11/17/2021 CLINICAL DATA:  Head trauma, moderate to severe EXAM: CT HEAD WITHOUT CONTRAST TECHNIQUE: Contiguous axial images were obtained from the base of the skull through the vertex without intravenous contrast. COMPARISON:  09/07/2020 FINDINGS: Brain: Hypodensity in the right frontal lobe, which is new from the prior exam but appears chronic. No acute infarct, hemorrhage, mass, mass effect, or midline shift. No hydrocephalus or extra-axial collection. Lacunar infarct in the left basal ganglia, also new from prior exam but likely chronic Vascular: No hyperdense vessel. Atherosclerotic calcifications in the intracranial carotid and vertebral arteries. Skull: Normal. Negative for fracture or focal lesion. Sinuses/Orbits: Negative.  Status post bilateral lens replacements. Other: The mastoids are well aerated. 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 No  acute intracranial process. Electronically Signed   By: Merilyn Baba M.D.   On: 11/17/2021 00:45   DG CHEST PORT 1 VIEW  Result Date: 11/19/2021 CLINICAL DATA:  Pneumonia, diabetes mellitus, hypertension EXAM: PORTABLE CHEST 1 VIEW COMPARISON:  Portable exam 0600 hours compared to 11/17/2021 FINDINGS: Interval extubation of endotracheal and nasogastric tubes. Upper normal heart size. Decreased lung volumes with persistent bibasilar pulmonary opacities, likely representing atelectasis and potentially superimposed infiltrates. No pleural effusion or pneumothorax. Bones unremarkable. IMPRESSION: Persistent bibasilar pulmonary opacities, atelectasis and questionable infiltrates. Electronically Signed   By: Lavonia Dana M.D.   On: 11/19/2021 08:24  DG Chest Portable 1 View  Result Date: 11/17/2021 CLINICAL DATA:  Evaluate intubation status. EXAM: PORTABLE CHEST 1 VIEW COMPARISON:  Portable chest 10/02/2021. FINDINGS: The patient is now intubated with tip of the ETT 1.3 cm from the carina, NGT has been inserted and loops around in the stomach with the tip probably in the proximal antrum based on the position of the proximal side-hole but not included in the exam. The heart is enlarged. There is mild perihilar vascular fullness without overt edema. No pleural effusion is seen. There is a low inspiration with exaggerated scarring changes in the bases and again noted 1.2 cm stable left apical lung nodule. There is increased opacity in the lateral left base which could be due to atelectasis or pneumonia. This is superimposed on peripheral scar-like opacities in the lower lung fields. No other new opacity is seen. IMPRESSION: 1. ETT tip is only 1.3 cm from the carina. NGT is adequately inserted. 2. Cardiomegaly. 3. Chronic change with low lung volumes and limited view of the bases. Questionable developing infiltrate lateral left lower lung field. 4. 1.2 cm stable left apical nodule. Electronically Signed   By: Telford Nab M.D.   On: 11/17/2021 01:15   US Abdomen Limited RUQ (LIVER/GB)  Result Date: 11/17/2021 CLINICAL DATA:  Gallbladder calculus. IR removal of calculi and debris. EXAM: ULTRASOUND ABDOMEN LIMITED RIGHT UPPER QUADRANT COMPARISON:  None. FINDINGS: Gallbladder: Evaluation is limited secondary to bowel gas and body habitus. Gallbladder is under distended. There are questionable shadowing gallstones. No sonographic Murphy sign noted by sonographer. Common bile duct: Diameter: 3.7 mm Liver: Limited evaluation. No focal lesion identified. Increase in parenchymal echogenicity. Portal vein is patent on color Doppler imaging with normal direction of blood flow towards the liver. Other: None. IMPRESSION: 1. Technically limited study. Questionable cholelithiasis. No additional sonographic evidence for acute cholecystitis. 2. Echogenic liver likely related to diffuse hepatocellular disease such as fatty infiltration. Electronically Signed   By: Ronney Asters M.D.   On: 11/17/2021 19:16     Time coordinating discharge: Over 30 minutes    Dwyane Dee, MD  [facility] 11/20/2021, 4:27 PM

## 2021-11-20 NOTE — Progress Notes (Signed)
RN went over d/c summary with pt and pt's wife. Belongings with pt's wife. NT removing PIV. Another family member will transport pt and his wife home.   NT transporting pt to private vehicle.

## 2021-11-21 ENCOUNTER — Ambulatory Visit (HOSPITAL_COMMUNITY): Payer: HMO

## 2021-11-22 LAB — CULTURE, BLOOD (SINGLE): Culture: NO GROWTH

## 2021-11-23 ENCOUNTER — Ambulatory Visit (HOSPITAL_COMMUNITY): Payer: HMO

## 2021-11-27 ENCOUNTER — Other Ambulatory Visit: Payer: Self-pay

## 2021-11-27 ENCOUNTER — Ambulatory Visit (HOSPITAL_COMMUNITY): Payer: HMO

## 2021-11-27 DIAGNOSIS — M542 Cervicalgia: Secondary | ICD-10-CM

## 2021-11-27 DIAGNOSIS — M6281 Muscle weakness (generalized): Secondary | ICD-10-CM | POA: Diagnosis not present

## 2021-11-27 DIAGNOSIS — M25471 Effusion, right ankle: Secondary | ICD-10-CM | POA: Diagnosis not present

## 2021-11-27 DIAGNOSIS — R293 Abnormal posture: Secondary | ICD-10-CM
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
## 2021-11-28 ENCOUNTER — Ambulatory Visit (HOSPITAL_COMMUNITY)
Admission: RE | Admit: 2021-11-28 | Discharge: 2021-11-28 | Disposition: A | Discharge status: Home / Self Care | Payer: HMO | Source: Ambulatory Visit | Attending: Internal Medicine | Admitting: Internal Medicine

## 2021-11-28 DIAGNOSIS — R911 Solitary pulmonary nodule: Secondary | ICD-10-CM | POA: Diagnosis not present

## 2021-11-28 DIAGNOSIS — I700 Atherosclerosis of aorta: Secondary | ICD-10-CM | POA: Diagnosis not present

## 2021-11-29 DIAGNOSIS — J9621 Acute and chronic respiratory failure with hypoxia: Secondary | ICD-10-CM | POA: Diagnosis not present

## 2021-11-29 DIAGNOSIS — J9611 Chronic respiratory failure with hypoxia: Secondary | ICD-10-CM | POA: Diagnosis not present

## 2021-11-29 DIAGNOSIS — R21 Rash and other nonspecific skin eruption: Secondary | ICD-10-CM | POA: Diagnosis not present

## 2021-11-29 DIAGNOSIS — E1165 Type 2 diabetes mellitus with hyperglycemia: Secondary | ICD-10-CM | POA: Diagnosis not present

## 2021-11-30 ENCOUNTER — Other Ambulatory Visit: Payer: Self-pay

## 2021-11-30 ENCOUNTER — Ambulatory Visit (HOSPITAL_COMMUNITY): Payer: HMO

## 2021-11-30 DIAGNOSIS — R293 Abnormal posture: Secondary | ICD-10-CM

## 2021-11-30 DIAGNOSIS — M6281 Muscle weakness (generalized): Secondary | ICD-10-CM

## 2021-11-30 DIAGNOSIS — M542 Cervicalgia: Secondary | ICD-10-CM
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
## 2021-12-04 ENCOUNTER — Ambulatory Visit (HOSPITAL_COMMUNITY): Payer: HMO | Attending: Internal Medicine | Admitting: Physical Therapy

## 2021-12-04 ENCOUNTER — Other Ambulatory Visit: Payer: Self-pay

## 2021-12-04 DIAGNOSIS — R293 Abnormal posture: Secondary | ICD-10-CM | POA: Insufficient documentation

## 2021-12-04 DIAGNOSIS — M25471 Effusion, right ankle: Secondary | ICD-10-CM | POA: Insufficient documentation

## 2021-12-04 DIAGNOSIS — M6281 Muscle weakness (generalized): Secondary | ICD-10-CM | POA: Diagnosis not present

## 2021-12-04 DIAGNOSIS — M542 Cervicalgia: Secondary | ICD-10-CM | POA: Insufficient documentation
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
## 2021-12-06 ENCOUNTER — Encounter: Payer: Self-pay | Admitting: Neurology

## 2021-12-06 ENCOUNTER — Ambulatory Visit (HOSPITAL_COMMUNITY): Payer: HMO | Admitting: Physical Therapy

## 2021-12-06 ENCOUNTER — Other Ambulatory Visit: Payer: Self-pay

## 2021-12-06 ENCOUNTER — Encounter (HOSPITAL_COMMUNITY): Payer: Self-pay | Admitting: Physical Therapy

## 2021-12-06 DIAGNOSIS — M542 Cervicalgia: Secondary | ICD-10-CM

## 2021-12-06 DIAGNOSIS — M6281 Muscle weakness (generalized): Secondary | ICD-10-CM | POA: Diagnosis not present

## 2021-12-06 DIAGNOSIS — M25471 Effusion, right ankle: Secondary | ICD-10-CM

## 2021-12-06 DIAGNOSIS — R293 Abnormal posture: Secondary | ICD-10-CM
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
## 2021-12-06 NOTE — Patient Instructions (Signed)
Access Code: [email or web address] URL: [email or web address] Date: 12/06/2021 Prepared by: Josue Hector  Exercises Supine Scapular Protraction in Flexion with Dumbbells - 2-3 x daily - 7 x weekly - 2 sets - 10 reps

## 2021-12-07 DIAGNOSIS — J449 Chronic obstructive pulmonary disease, unspecified: Secondary | ICD-10-CM | POA: Diagnosis not present

## 2021-12-07 DIAGNOSIS — I5040 Unspecified combined systolic (congestive) and diastolic (congestive) heart failure: Secondary | ICD-10-CM | POA: Diagnosis not present

## 2021-12-07 DIAGNOSIS — J9611 Chronic respiratory failure with hypoxia: Secondary | ICD-10-CM | POA: Diagnosis not present

## 2021-12-10 ENCOUNTER — Ambulatory Visit (HOSPITAL_COMMUNITY): Payer: HMO | Admitting: Physical Therapy

## 2021-12-10 ENCOUNTER — Other Ambulatory Visit: Payer: Self-pay

## 2021-12-10 ENCOUNTER — Encounter (HOSPITAL_COMMUNITY): Payer: Self-pay | Admitting: Physical Therapy

## 2021-12-10 DIAGNOSIS — M6281 Muscle weakness (generalized): Secondary | ICD-10-CM

## 2021-12-10 DIAGNOSIS — R293 Abnormal posture: Secondary | ICD-10-CM

## 2021-12-10 DIAGNOSIS — M542 Cervicalgia: Secondary | ICD-10-CM

## 2021-12-10 DIAGNOSIS — M25471 Effusion, right ankle: Secondary | ICD-10-CM

## 2021-12-10 NOTE — Patient Instructions (Signed)
Access Code: [email or web address] URL: [email or web address] Date: 12/10/2021 Prepared by: Margie Billet  Exercises Seated Thoracic Lumbar Extension with Pectoralis Stretch - 1 x daily - 7 x weekly - 2 sets - 10 reps - 5 second hold Standing Shoulder Row with Anchored Resistance - 1 x daily - 7 x weekly - 3 sets - 15 reps Shoulder extension with resistance - Neutral - 1 x daily - 7 x weekly - 3 sets - 15 reps
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
## 2021-12-12 ENCOUNTER — Ambulatory Visit (HOSPITAL_COMMUNITY): Payer: HMO

## 2021-12-12 ENCOUNTER — Other Ambulatory Visit: Payer: Self-pay

## 2021-12-12 DIAGNOSIS — J9611 Chronic respiratory failure with hypoxia: Secondary | ICD-10-CM | POA: Diagnosis not present

## 2021-12-12 DIAGNOSIS — M6281 Muscle weakness (generalized): Secondary | ICD-10-CM | POA: Diagnosis not present

## 2021-12-12 DIAGNOSIS — I5040 Unspecified combined systolic (congestive) and diastolic (congestive) heart failure: Secondary | ICD-10-CM | POA: Diagnosis not present

## 2021-12-12 DIAGNOSIS — R293 Abnormal posture: Secondary | ICD-10-CM

## 2021-12-12 DIAGNOSIS — J449 Chronic obstructive pulmonary disease, unspecified: Secondary | ICD-10-CM | POA: Diagnosis not present

## 2021-12-12 DIAGNOSIS — M542 Cervicalgia: Secondary | ICD-10-CM
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
## 2021-12-18 ENCOUNTER — Other Ambulatory Visit: Payer: Self-pay

## 2021-12-18 ENCOUNTER — Encounter (HOSPITAL_COMMUNITY): Payer: Self-pay

## 2021-12-18 ENCOUNTER — Ambulatory Visit (HOSPITAL_COMMUNITY): Payer: HMO

## 2021-12-18 DIAGNOSIS — M542 Cervicalgia: Secondary | ICD-10-CM

## 2021-12-18 DIAGNOSIS — M6281 Muscle weakness (generalized): Secondary | ICD-10-CM

## 2021-12-18 DIAGNOSIS — R293 Abnormal posture: Secondary | ICD-10-CM
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
## 2021-12-19 ENCOUNTER — Encounter (HOSPITAL_COMMUNITY): Payer: Self-pay | Admitting: Physical Therapy

## 2021-12-19 ENCOUNTER — Ambulatory Visit (HOSPITAL_COMMUNITY): Payer: HMO | Admitting: Physical Therapy

## 2021-12-19 DIAGNOSIS — M25471 Effusion, right ankle: Secondary | ICD-10-CM

## 2021-12-19 DIAGNOSIS — M542 Cervicalgia: Secondary | ICD-10-CM

## 2021-12-19 DIAGNOSIS — R293 Abnormal posture: Secondary | ICD-10-CM

## 2021-12-19 DIAGNOSIS — M6281 Muscle weakness (generalized): Secondary | ICD-10-CM
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
## 2021-12-25 ENCOUNTER — Other Ambulatory Visit: Payer: Self-pay

## 2021-12-25 ENCOUNTER — Ambulatory Visit (HOSPITAL_COMMUNITY): Payer: HMO

## 2021-12-25 DIAGNOSIS — M6281 Muscle weakness (generalized): Secondary | ICD-10-CM | POA: Diagnosis not present

## 2021-12-25 DIAGNOSIS — R293 Abnormal posture: Secondary | ICD-10-CM

## 2021-12-25 DIAGNOSIS — M542 Cervicalgia: Secondary | ICD-10-CM
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
## 2021-12-26 ENCOUNTER — Ambulatory Visit (HOSPITAL_COMMUNITY): Payer: HMO

## 2021-12-26 DIAGNOSIS — R293 Abnormal posture: Secondary | ICD-10-CM

## 2021-12-26 DIAGNOSIS — M6281 Muscle weakness (generalized): Secondary | ICD-10-CM

## 2021-12-26 DIAGNOSIS — M542 Cervicalgia: Secondary | ICD-10-CM
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
## 2021-12-31 DIAGNOSIS — E1165 Type 2 diabetes mellitus with hyperglycemia: Secondary | ICD-10-CM | POA: Diagnosis not present

## 2021-12-31 DIAGNOSIS — E782 Mixed hyperlipidemia: Secondary | ICD-10-CM | POA: Diagnosis not present

## 2022-01-01 ENCOUNTER — Encounter: Payer: Self-pay | Admitting: Internal Medicine

## 2022-01-01 ENCOUNTER — Ambulatory Visit: Payer: HMO | Admitting: Internal Medicine

## 2022-01-01 ENCOUNTER — Other Ambulatory Visit: Payer: Self-pay

## 2022-01-01 DIAGNOSIS — I82403 Acute embolism and thrombosis of unspecified deep veins of lower extremity, bilateral: Secondary | ICD-10-CM

## 2022-01-01 DIAGNOSIS — J9611 Chronic respiratory failure with hypoxia: Secondary | ICD-10-CM | POA: Diagnosis not present

## 2022-01-01 DIAGNOSIS — U071 COVID-19: Secondary | ICD-10-CM

## 2022-01-01 NOTE — Assessment & Plan Note (Signed)
See venous dopplers 08/09/20 pos bilateral dvt with echo 08/26/20 neg for elevated R sided pressures  - repeat venous dopplers 03/29/2021 >>>  No evidence of acute or chronic DVT within either lower extremity. - 10/02/2021 rec repeat venous dopplers since swelling is worse > done 10/05/2021 neg bilaterally

## 2022-01-01 NOTE — Assessment & Plan Note (Signed)
Onset of illness 07/22/20 c/b bilateral dvt and L PTX / 02 dep at d/c 09/28/20  -  02/09/2021   Walked RA  approx   100 ft  @ slow pace  stopped due to  desats to 87% corrected to 90% on 1lpm  POC x another 100 ft then desat again   - PFT's  03/07/21   FEV1 1.26 (37 % ) ratio 0.92  p 4 % improvement from saba p 0 prior to study with DLCO  11.40 (46%) corrects to 3.09 (128%)  for alv volume and FV curve nl   - CTa 04/04/21 1. No evidence of pulmonary embolism. 2. Findings suggestive of mild to moderate severity interstitial edema. A superimposed infectious component cannot be excluded. 3. Stable posterior left upper lobe noncalcified lung nodule versus scarring.    May have additional acute lung injury/ post inflammatory effects from recent asp/ mech vent but clinically improving so no need to intervene at this point with further hrct/steroid trials

## 2022-01-01 NOTE — Progress Notes (Signed)
[place], male    DOB: Aug 09, 1951    MRN: [id number]   Brief patient profile:  44    yowm never smoker with DM then covid Jul 28 2020   Admit date: 08/19/2020 Discharge date: 09/28/2020   Recommendations for Outpatient Follow-up:  Follow-up urology in 2 weeks for voiding trial Patient will be discharged with Foley catheter Continue oxygen 2 to 3 L/min via nasal cannula, wean off as tolerated Follow-up with ophthalmology as outpatient   Discharge Diagnoses:  Active Problems:   DM2 (diabetes mellitus, type 2) (HCC)   Benign essential HTN   Dyslipidemia   Chest tube in place   COVID-19 virus infection   Acute hypoxemic respiratory failure due to COVID-19 [facility])   Pneumothorax, left   Pneumonia due to COVID-19 virus   Leg DVT (deep venous thromboembolism), acute, bilateral (HCC)   Pressure injury of skin   Acute respiratory failure with hypoxia (HCC)   HCAP (healthcare-associated pneumonia)   Primary spontaneous pneumothorax   Protein-calorie malnutrition, severe   Adjustment disorder with anxious mood      History of present illness:  71 year old male with recent Covid pneumonia, 08/08/2020 -  3/90/3009, complicated by bilateral DVTs was admitted 08/19/2020 with left pneumothorax requiring chest tube placement and subsequent intubation 08/20/2020.  He was extubated following day but had persistent bilateral infiltrates requiring high flow nasal cannula 10 to 15 L/min oxygen.  Hospital course complicated by urinary retention, Foley catheter was inserted.  Also had episode of sudden loss of vision in right eye which has now resolved.  Neurology work-up was negative.   Hospital Course:  Acute hypoxemic and hypercarbic respiratory failure-secondary to recent COVID-19 pneumonia, s/p left pneumothorax.  Wean down to 2 L nasal cannula oxygen, continue to wean off as tolerated.  Continue to wean prednisone.  Will discharge on prednisone taper for 5 more days.  Continue as needed  albuterol inhaler. Acute urine retention-started on tamsulosin, Foley catheter.  Failed voiding trial x2.    Urology recommends to discharge patient with Foley catheter. Urology will see patient as outpatient. Bilateral DVTs- continue Eliquis. Diabetes mellitus type 2-continue Lantus, sliding scale insulin with NovoLog, linagliptin.  CBG well controlled. Depression/anxiety-continue Ativan as needed Sudden unilateral vision loss-resolved, patient had episode of sudden loss of vision right eye on 09/07/2020, he was unable to see anything for few seconds only, later on his vision came back but it remained blurry for 15 to 20 minutes.  CT head without contrast was negative.  MRI was unremarkable.  Bilateral carotid duplex was unremarkable.  Discussed with patient and his wife, he needs to follow-up with ophthalmology as outpatient. Left eye subconjunctival hemorrhage-stable       History of Present Illness  02/09/2021  Pulmonary/ 1st office eval/Tim Day  Chief Complaint  Patient presents with   Consult    Shortness of breath with a lot of exertion  Dyspnea:  Walks x 10 laps = maybe 6-8 min at slow pace at building / walks s 02 with  sats 86% Baseline faster walk s 02  Cough: some pnds mucus is clear Sleep: has sleep number bed but still in incliner with 30 degrees fine  SABA use: not helping 02  1.5 hs conc and prn daytime but not titrating  rec Continue pantoprazole 40 mg Take 30-60 min before first meal of the day and add pepcid 20 mg after supper  GERD No need for inhalers  Make sure you check your oxygen saturation  at your  highest level of activity  to be sure it stays over 90% and adjust  02 flow upward to maintain this level if needed but remember to turn it back to previous settings when you stop (to conserve your supply).  Please schedule a follow up office visit in 6 weeks, call sooner if needed with PFT's on return      03/23/2021  f/u ov/[place] office/Tim Day re: Covid  pneumonia, 08/08/2020 -  2/67/1245, complicated by bilateral DVTs / 02 desp resp failure  Chief Complaint  Patient presents with   Follow-up    Intermittent productive cough with clear phlegm   Dyspnea:  Walking up to an hour but stops because 02 drops 86% on 3lpm  Cough:  not a big problem / assoc with pnds sensation but no excess pnds/ sleep disturbance  Sleeping: no problem sleeping in recliner 30 degrees  SABA use: none  02: 3lpm 24/7  Covid status: 2nd shot feb 2022 pfizer  Rec Stop pantoprazole and change pepcid 20 mg after breakfast and supper x 1 week then just take one after supper x one week then stop  - if  flare go back one stop      08/01/2021  f/u ov/[place] office/Tim Day re:  Covid pneumonia, 08/08/2020 -  07/10/9832, complicated by bilateral DVTs / 02 desp resp failure / SPN Chief Complaint  Patient presents with   Follow-up    2L O2 when moving around. When sitting none is needed. Occasionally coughing up clear mucus.   Dyspnea:  walking an hour flat surface on 2 - 3lpm s stopping sats ok on 02 Cough: some pnds  Sleeping: recliner 30 degrees  SABA use: none  02: up to 3lpm with ex / ok at rest s 02/ / 2lpmhs  Covid status: vax x 2 / delta infection Needs surgical clearance for possible cholecystectomy, still has cholecystostomy drain but pain when clamps and surgery may be needed though nothing immediate needed  per Dr Arnoldo Morale pt has no stones to remove Rec To get the most out of exercise, you need to be continuously aware that you are short of breath, but never out of breath, for at least 30 minutes daily.   Make sure you check your oxygen saturations at highest level of activity to be sure they are maintaining above 90%  You  are cleared for surgery but will need early mobilization and Incentvive spirometry every hour before and after and minimal pain medications      GB surgery x 2   10/02/2021  f/u ov/[place] office/Tim Day re: post covid pna resp failure   Chief  Complaint  Patient presents with   Follow-up    From lobby to room pt O2 sats are at 87% on 3lpm. After sitting O2 sats increased to 92% 3lpm   Sob  and cough are the same as last visit per patient.   Dyspnea:  walking indoors x  35mn on 4lpm pulsed /  Cough: min mucoid Sleeping: recliner 30 degrees/ 2lpm conc SABA use: none  02: 2lpm and 4 lpm walking  Covid status: 2 total vax Lung cancer screening: never smoker  Rec Make sure you check your oxygen saturation  at your highest level of activity   Bicycle ergometer should be next to your concentrator  Incentive spirometry as much as possible   Please schedule a follow up visit in 3 months but call sooner if needed   10/02/2021 rec repeat venous dopplers since swelling is worse > done 10/05/2021  neg bilaterally  Admit date: 11/17/2021 Discharge date: 11/20/2021   A/P  Acute on chronic hypoxic / hypercarbic respiratory failure requiring intubation Suspected aspiration PNA  -Transition from unasyn to augmentin on 11/20/2021 and complete course and d/c -Extubated 12/17   Septic shock  due to suspected aspiration PNA, with likely component of hypovolemia due to recent GI losses - Resolved.   Mild transaminitis -resolved -due to acute illness   Hyperkalemia -Resolved.   1.2 cm Left Apical lung nodule Hx of RLL nodule: Patient of Dr. Melvyn Novas CT chest scheduled for next week Close out patient follow up    01/01/2022  post hosp f/u ov/[place] office/Tim Day re: post covid resp failure 02 dep  maint on 02   Chief Complaint  Patient presents with   [place] admission for aspiration 11/17/2021. Feels breathing is improving since last OV.  2LO2 pulse and 1.5 LO2 at home.    Dyspnea:  walks up to 30 min and stops every 5 min when stops with sats in mid 80's and not titrating as rec  Also rides bike x 15 min  Cough: none / still hoarse but not much different than baseline  Sleeping: < 30 degrees in recliner flatter than he  was  SABA use: none  02: 1.5 at rest and sleeping/ 2lpm when walking  Covid status: vax x 2 / and covid infections  Lung cancer screening: never    No obvious day to day or daytime variability or assoc excess/ purulent sputum or mucus plugs or hemoptysis or cp or chest tightness, subjective wheeze or overt sinus or hb symptoms.   Sleeping as above  without nocturnal  or early am exacerbation  of respiratory  c/o's or need for noct saba. Also denies any obvious fluctuation of symptoms with weather or environmental changes or other aggravating or alleviating factors except as outlined above   No unusual exposure hx or h/o childhood pna/ asthma or knowledge of premature birth.  Current Allergies, Complete Past Medical History, Past Surgical History, Family History, and Social History were reviewed in [employer] record.  ROS  The following are not active complaints unless bolded Hoarseness, sore throat, dysphagia, dental problems, itching, sneezing,  nasal congestion or discharge of excess mucus or purulent secretions, ear ache,   fever, chills, sweats, unintended wt loss or wt gain, classically pleuritic or exertional cp,  orthopnea pnd or arm/hand swelling  or leg swelling, presyncope, palpitations, abdominal pain, anorexia, nausea, vomiting, diarrhea  or change in bowel habits or change in bladder habits, change in stools or change in urine, dysuria, hematuria,  rash, arthralgias, visual complaints, headache, numbness, weakness or ataxia or problems with walking or coordination,  change in mood or  memory.        Current Meds  Medication Sig   ACCU-CHEK GUIDE test strip USE TO TEST TWICE DAILY.D   acetaminophen (TYLENOL) 500 MG tablet Take 1,000 mg by mouth 2 (two) times daily as needed for moderate pain.   alclomethasone (ACLOVATE) 0.05 % cream Apply topically 2 (two) times daily as needed (Rash). (Patient taking differently: Apply 1 application topically 2 (two) times  daily as needed (Rash).)   ALPRAZolam (XANAX) 0.25 MG tablet Take 0.25 mg by mouth daily as needed for anxiety.   alum & mag hydroxide-simeth (MAALOX/MYLANTA) 200-200-20 MG/5ML suspension Take 30 mLs by mouth every 4 (four) hours as needed for indigestion.   ascorbic acid (VITAMIN C) 500 MG tablet Take 1 tablet (500  mg total) by mouth daily.   aspirin EC 81 MG tablet Take 81 mg by mouth daily. Swallow whole.   blood glucose meter kit and supplies KIT Dispense based on patient and insurance preference. Use up to four times daily as directed. (FOR ICD-9 250.00, 250.01).   Cholecalciferol (VITAMIN D3) 125 MCG (5000 UT) CAPS Take 5,000 Units by mouth daily.   co-enzyme Q-10 50 MG capsule Take 50 mg by mouth 2 (two) times daily.   dapagliflozin propanediol (FARXIGA) 10 MG TABS tablet Take 10 mg by mouth daily.   Empagliflozin (JARDIANCE PO) Take 25 mg by mouth.   famotidine (PEPCID) 20 MG tablet Take 20 mg by mouth daily after supper.   FIASP FLEXTOUCH 100 UNIT/ML FlexTouch Pen Inject 2-8 Units into the skin every evening.   furosemide (LASIX) 20 MG tablet Take 20 mg by mouth daily as needed for edema.   Insulin Pen Needle (PEN NEEDLES) 32G X 4 MM MISC 1 Package by Does not apply route 4 (four) times daily -  before meals and at bedtime.   metFORMIN (GLUCOPHAGE) 1000 MG tablet Take 1,000 mg by mouth 2 (two) times daily with a meal.   Multiple Vitamin (MULTIVITAMIN) capsule Take 1 capsule by mouth daily.   olmesartan-hydrochlorothiazide (BENICAR HCT) 20-12.5 MG tablet Take 1 tablet by mouth daily.   pantoprazole (PROTONIX) 40 MG tablet Take 40 mg by mouth daily.   Phenylephrine HCl (SINEX REGULAR NA) Place 1 spray into the nose daily as needed (congestion).   rosuvastatin (CRESTOR) 5 MG tablet Take 5 mg by mouth daily.   sertraline (ZOLOFT) 50 MG tablet Take 50 mg by mouth at bedtime.   TOUJEO MAX SOLOSTAR 300 UNIT/ML Solostar Pen Inject 30 Units into the skin in the morning.   zinc sulfate 220 (50  Zn) MG capsule Take 1 capsule (220 mg total) by mouth daily.   zolpidem (AMBIEN) 10 MG tablet Take 10 mg by mouth at bedtime.                      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    NECK :  without JVD/Nodes/TM/  nl carotid upstrokes bilaterally   LUNGS: no acc muscle use,  slt kyphotic contour chest  min insp crackles in bases  bilaterally without cough on insp or exp maneuvers   CV:  RRR  no s3 or murmur or increase in P2, and  very min pitting R > L LE   ABD:  soft and nontender with nl inspiratory excursion in the supine position. No bruits or organomegaly appreciated, bowel sounds nl  MS:  Nl gait/ ext warm without deformities, calf tenderness, cyanosis or clubbing No obvious joint restrictions   SKIN: warm and dry without lesions    NEURO:  alert, approp, nl sensorium with  no motor or cerebellar deficits apparent.      I personally reviewed images and agree with radiology  impression as follows:   Chest CT w/o contrast 1. Multifocal nodularity in the left lung, similar to the prior study, favored to reflect areas of chronic post infectious or inflammatory scarring given the similarity compared to the prior examination. 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 Repeat high-resolution chest CT is recommended in 12 months to assess for temporal changes in the appearance of the lung parenchyma               Assessment

## 2022-01-01 NOTE — Patient Instructions (Addendum)
Keep up the exercise, just make sure you keep your saturations above 90% goal.   Please schedule a follow up visit in 3 months but call sooner if needed

## 2022-01-01 NOTE — Assessment & Plan Note (Addendum)
Onset with covid pna Aug 2021  - desats on RA p 100 ft 02/09/2021 > titrate to sats > 90% using POC max flow of 6lpm  - 10/02/2021 patient walked at a slow pace on 2lpm. During the second lap he reported SOB and desat to 87%. Increased oxygen on second lap to 3lpm POC  and recovered to 92%.  Advised again: Make sure you check your oxygen saturation  AT  your highest level of activity (not after you stop)   to be sure it stays over 90% and adjust  02 flow upward to maintain this level if needed but remember to turn it back to previous settings when you stop (to conserve your supply).          Each maintenance medication was reviewed in detail including emphasizing most importantly the difference between maintenance and prns and under what circumstances the prns are to be triggered using an action plan format where appropriate.  Total time for H and P, chart review, counseling, reviewing 02  device(s) and generating customized AVS unique to this post hosp  office visit / same day charting = 25 min

## 2022-01-07 DIAGNOSIS — I5040 Unspecified combined systolic (congestive) and diastolic (congestive) heart failure: Secondary | ICD-10-CM | POA: Diagnosis not present

## 2022-01-07 DIAGNOSIS — J449 Chronic obstructive pulmonary disease, unspecified: Secondary | ICD-10-CM | POA: Diagnosis not present

## 2022-01-07 DIAGNOSIS — J9611 Chronic respiratory failure with hypoxia: Secondary | ICD-10-CM | POA: Diagnosis not present

## 2022-01-12 DIAGNOSIS — I5040 Unspecified combined systolic (congestive) and diastolic (congestive) heart failure: Secondary | ICD-10-CM | POA: Diagnosis not present

## 2022-01-12 DIAGNOSIS — J9611 Chronic respiratory failure with hypoxia: Secondary | ICD-10-CM | POA: Diagnosis not present

## 2022-01-12 DIAGNOSIS — J449 Chronic obstructive pulmonary disease, unspecified: Secondary | ICD-10-CM | POA: Diagnosis not present

## 2022-02-04 DIAGNOSIS — I5040 Unspecified combined systolic (congestive) and diastolic (congestive) heart failure: Secondary | ICD-10-CM | POA: Diagnosis not present

## 2022-02-04 DIAGNOSIS — J449 Chronic obstructive pulmonary disease, unspecified: Secondary | ICD-10-CM | POA: Diagnosis not present

## 2022-02-04 DIAGNOSIS — J9611 Chronic respiratory failure with hypoxia: Secondary | ICD-10-CM | POA: Diagnosis not present

## 2022-02-08 DIAGNOSIS — Q540 Hypospadias, balanic: Secondary | ICD-10-CM | POA: Diagnosis not present

## 2022-02-08 DIAGNOSIS — R338 Other retention of urine: Secondary | ICD-10-CM | POA: Diagnosis not present

## 2022-02-08 DIAGNOSIS — N3000 Acute cystitis without hematuria: Secondary | ICD-10-CM | POA: Diagnosis not present

## 2022-02-09 DIAGNOSIS — I5040 Unspecified combined systolic (congestive) and diastolic (congestive) heart failure: Secondary | ICD-10-CM | POA: Diagnosis not present

## 2022-02-09 DIAGNOSIS — J9611 Chronic respiratory failure with hypoxia: Secondary | ICD-10-CM | POA: Diagnosis not present

## 2022-02-09 DIAGNOSIS — J449 Chronic obstructive pulmonary disease, unspecified: Secondary | ICD-10-CM | POA: Diagnosis not present

## 2022-02-11 ENCOUNTER — Ambulatory Visit: Payer: HMO | Admitting: Neurology

## 2022-02-11 ENCOUNTER — Other Ambulatory Visit (INDEPENDENT_AMBULATORY_CARE_PROVIDER_SITE_OTHER): Payer: HMO

## 2022-02-11 ENCOUNTER — Other Ambulatory Visit: Payer: Self-pay

## 2022-02-11 ENCOUNTER — Encounter: Payer: Self-pay | Admitting: Neurology

## 2022-02-11 VITALS — BP 136/78 | HR 90 | Resp 18 | Ht 70.0 in | Wt 189.0 lb

## 2022-02-11 DIAGNOSIS — M5382 Other specified dorsopathies, cervical region: Secondary | ICD-10-CM

## 2022-02-11 DIAGNOSIS — M436 Torticollis: Secondary | ICD-10-CM

## 2022-02-11 DIAGNOSIS — M5412 Radiculopathy, cervical region: Secondary | ICD-10-CM

## 2022-02-11 LAB — CK: Total CK: 105 U/L (ref 7–232)
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
## 2022-02-11 NOTE — Patient Instructions (Signed)
Check labs and MRI cervical spine ?

## 2022-02-12 LAB — ALDOLASE: Aldolase: 4.4 U/L (ref <=8.1)

## 2022-02-17 IMAGING — CT CT ANGIO CHEST
2 of 6 series · 19 of 46 positions shown · IV contrast (omnipaque)
Comparison: 08/08/2020, 08/02/2020

CLINICAL DATA: H3TYP-3N positive, shortness of breath

EXAM:
CT ANGIOGRAPHY CHEST WITH CONTRAST
TECHNIQUE: Multidetector CT imaging of the chest was performed using the
standard protocol during bolus administration of intravenous
contrast. Multiplanar CT image reconstructions and MIPs were
obtained to evaluate the vascular anatomy.
CONTRAST:  75mL OMNIPAQUE IOHEXOL 350 MG/ML SOLN

[Series 6: thins · axial · 0.82mm/px · z∈[+1379,+1567]mm · 16 of 206 slices shown]
[im 9/206  lung]
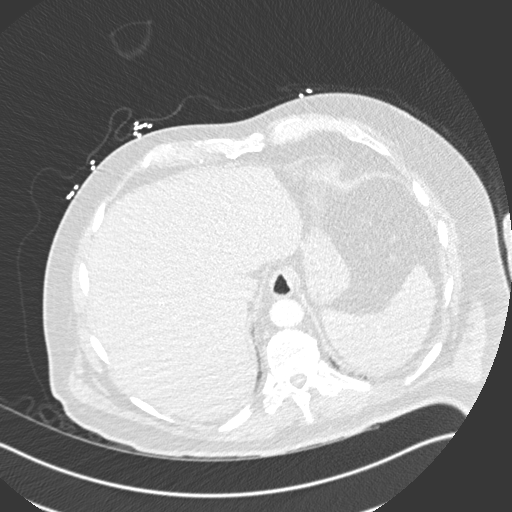
[im 27/206  soft-tissue]
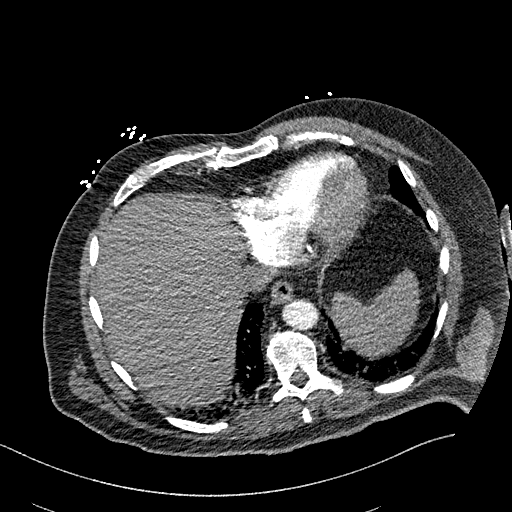
[im 36/206  lung]
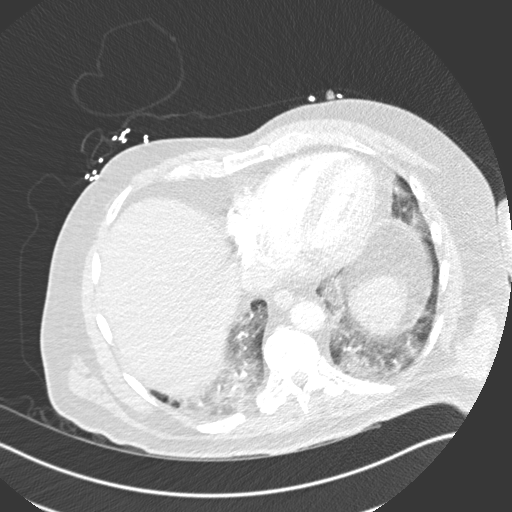
[im 45/206  soft-tissue]
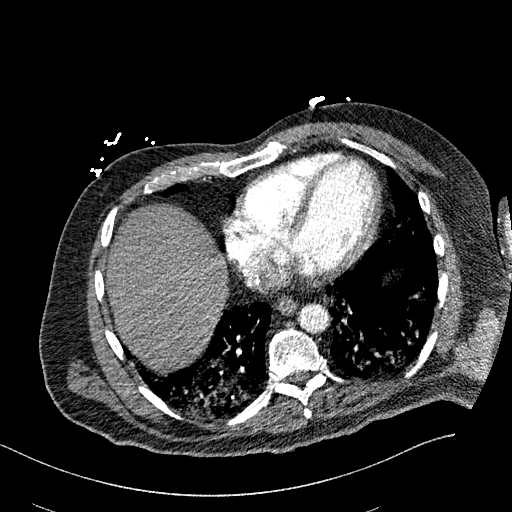
[im 63/206  lung]
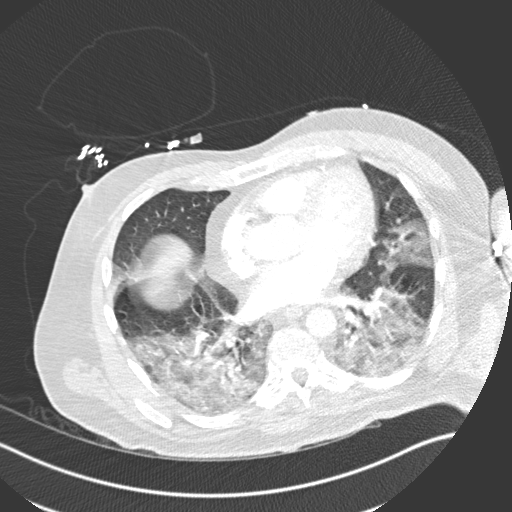
[im 72/206  soft-tissue]
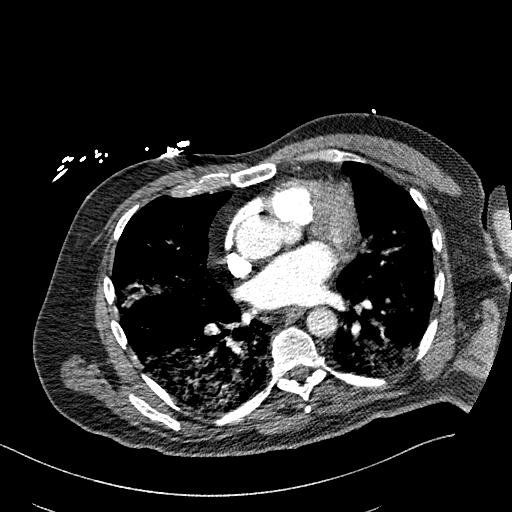
[im 81/206  lung]
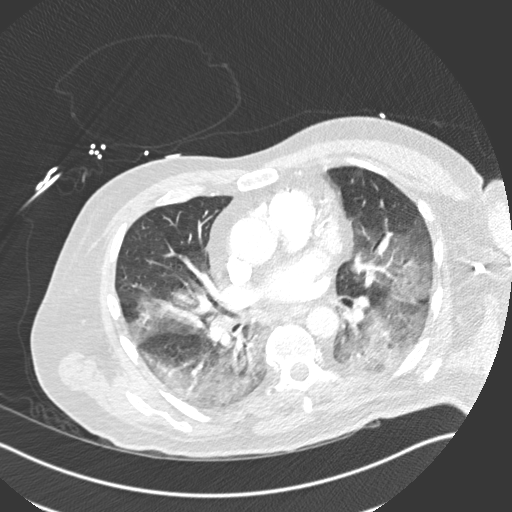
[im 99/206  soft-tissue]
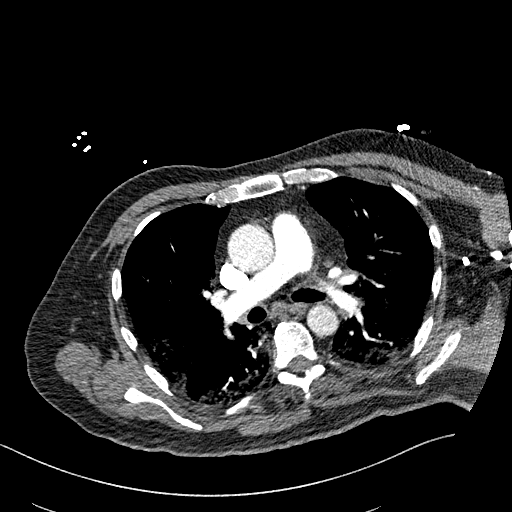
[im 107/206  lung]
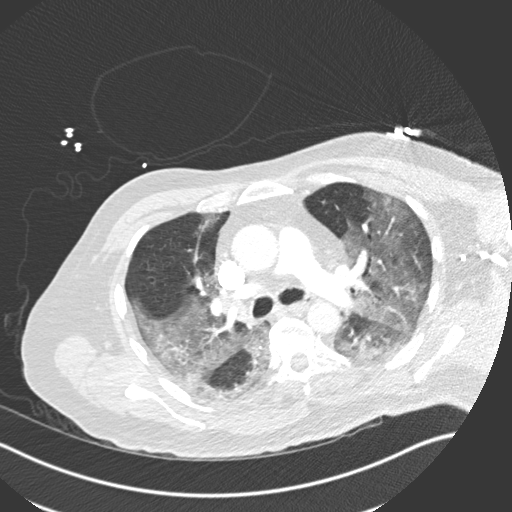
[im 125/206  soft-tissue]
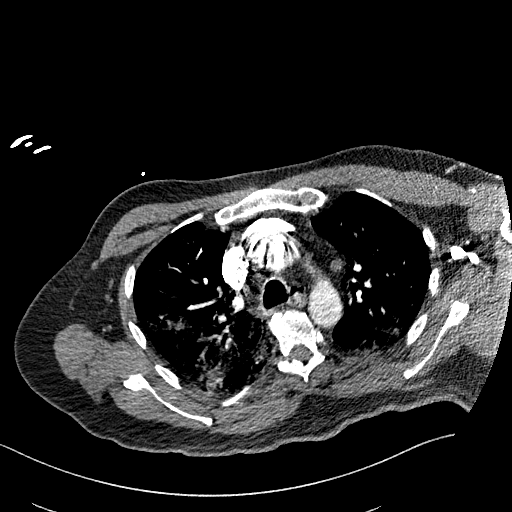
[im 134/206  lung]
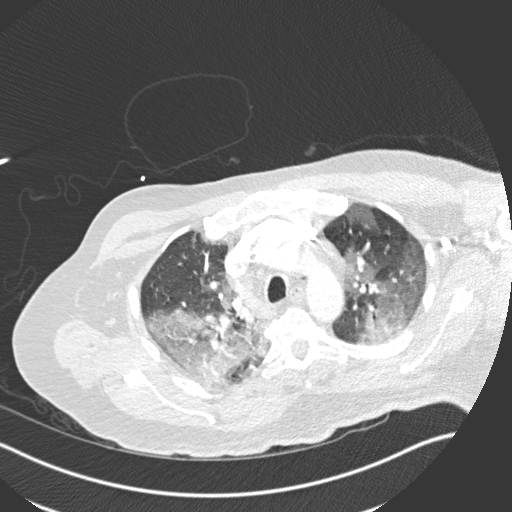
[im 143/206  soft-tissue]
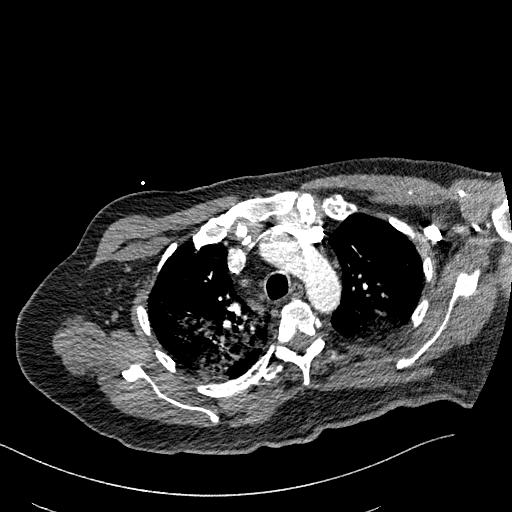
[im 161/206  lung]
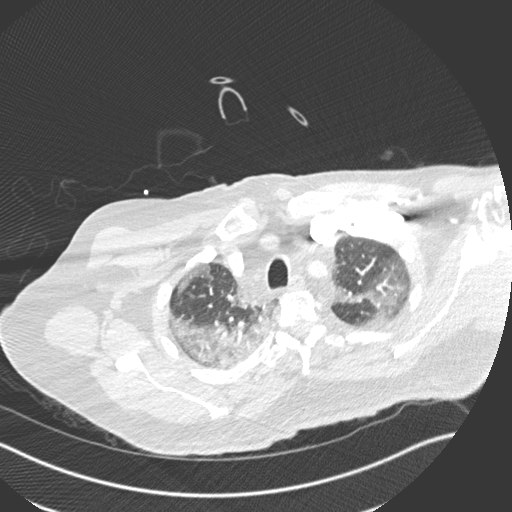
[im 170/206  soft-tissue]
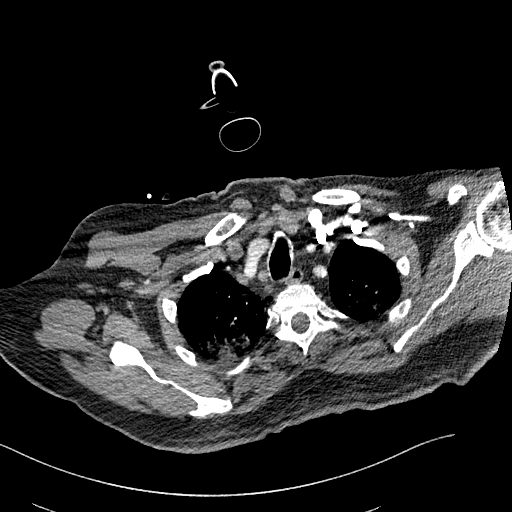
[im 179/206  lung]
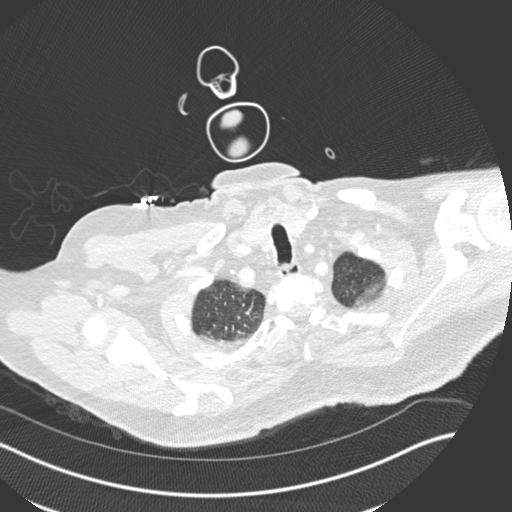
[im 197/206  soft-tissue]
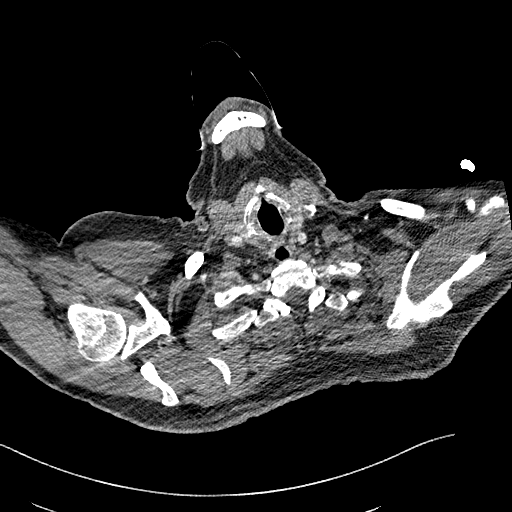

[Series 8: coronal mpr · coronal · 0.59mm/px · 3 of 151 slices shown]
[im 38/151  soft-tissue]
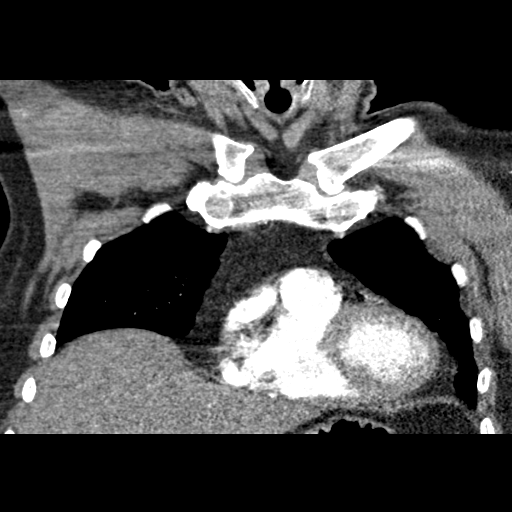
[im 76/151  soft-tissue]
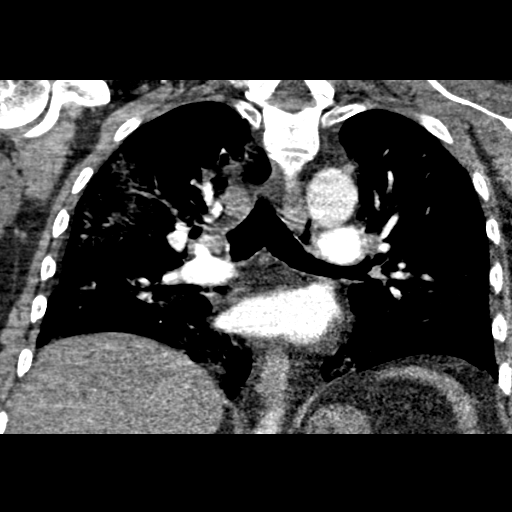
[im 113/151  soft-tissue]
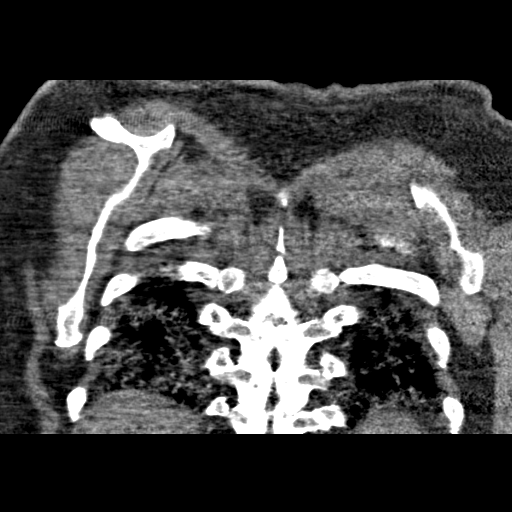

[19 of 46 positions shown; findings below may reference images not displayed]

FINDINGS: Cardiovascular: This is a technically adequate evaluation of the
pulmonary vasculature. No filling defects or pulmonary emboli.

The heart is unremarkable without pericardial effusion. Mild
atherosclerosis of the aorta and LAD distribution of the coronary
vasculature.

Mediastinum/Nodes: There is a small amount of pneumomediastinum
anterior to the right hilum and left mainstem bronchus, likely
result of barotrauma. No pathologic adenopathy. Thyroid, trachea,
and esophagus are unremarkable.

Lungs/Pleura: Multifocal bilateral areas of ground-glass airspace
disease consistent with COVID 19 pneumonia. No effusion or
pneumothorax. The central airways are patent.

Upper Abdomen: No acute abnormality.

Musculoskeletal: No acute or destructive bony lesions. Reconstructed
images demonstrate no additional findings.

Review of the MIP images confirms the above findings.
IMPRESSION: 1. Widespread bilateral ground-glass airspace disease consistent
with multifocal H3TYP-3N pneumonia.
2. Minimal pneumomediastinum consistent with barotrauma.
3. No evidence of pulmonary embolus.
4.  Aortic Atherosclerosis ([id number]).

These results were called by telephone at the time of interpretation
on 08/08/2020 at [DATE] to provider Jiawei Homer, who verbally
acknowledged these results.

## 2022-02-18 ENCOUNTER — Other Ambulatory Visit: Payer: Self-pay

## 2022-02-18 ENCOUNTER — Ambulatory Visit
Admission: RE | Admit: 2022-02-18 | Discharge: 2022-02-18 | Disposition: A | Discharge status: Home / Self Care | Payer: HMO | Source: Ambulatory Visit | Attending: Neurology | Admitting: Neurology

## 2022-02-18 DIAGNOSIS — M5382 Other specified dorsopathies, cervical region: Secondary | ICD-10-CM

## 2022-02-18 DIAGNOSIS — M5412 Radiculopathy, cervical region: Secondary | ICD-10-CM

## 2022-02-18 DIAGNOSIS — M5022 Other cervical disc displacement, mid-cervical region, unspecified level: Secondary | ICD-10-CM | POA: Diagnosis not present

## 2022-02-18 DIAGNOSIS — M436 Torticollis: Secondary | ICD-10-CM

## 2022-02-18 DIAGNOSIS — M47812 Spondylosis without myelopathy or radiculopathy, cervical region: Secondary | ICD-10-CM | POA: Diagnosis not present

## 2022-02-25 ENCOUNTER — Other Ambulatory Visit: Payer: Self-pay | Admitting: Internal Medicine

## 2022-03-01 DIAGNOSIS — I10 Essential (primary) hypertension: Secondary | ICD-10-CM | POA: Diagnosis not present

## 2022-03-01 DIAGNOSIS — E785 Hyperlipidemia, unspecified: Secondary | ICD-10-CM | POA: Diagnosis not present

## 2022-03-07 DIAGNOSIS — I5040 Unspecified combined systolic (congestive) and diastolic (congestive) heart failure: Secondary | ICD-10-CM | POA: Diagnosis not present

## 2022-03-07 DIAGNOSIS — J449 Chronic obstructive pulmonary disease, unspecified: Secondary | ICD-10-CM | POA: Diagnosis not present

## 2022-03-07 DIAGNOSIS — J9611 Chronic respiratory failure with hypoxia: Secondary | ICD-10-CM | POA: Diagnosis not present

## 2022-03-12 DIAGNOSIS — J9611 Chronic respiratory failure with hypoxia: Secondary | ICD-10-CM | POA: Diagnosis not present

## 2022-03-12 DIAGNOSIS — I5040 Unspecified combined systolic (congestive) and diastolic (congestive) heart failure: Secondary | ICD-10-CM | POA: Diagnosis not present

## 2022-03-12 DIAGNOSIS — J449 Chronic obstructive pulmonary disease, unspecified: Secondary | ICD-10-CM | POA: Diagnosis not present

## 2022-03-13 ENCOUNTER — Ambulatory Visit: Payer: HMO | Admitting: Physician Assistant

## 2022-03-13 ENCOUNTER — Encounter: Payer: Self-pay | Admitting: Physician Assistant

## 2022-03-13 DIAGNOSIS — Z1283 Encounter for screening for malignant neoplasm of skin: Secondary | ICD-10-CM | POA: Diagnosis not present

## 2022-03-13 DIAGNOSIS — L219 Seborrheic dermatitis, unspecified: Secondary | ICD-10-CM | POA: Diagnosis not present

## 2022-03-13 DIAGNOSIS — Z86018 Personal history of other benign neoplasm: Secondary | ICD-10-CM | POA: Diagnosis not present

## 2022-03-13 DIAGNOSIS — Z85828 Personal history of other malignant neoplasm of skin: Secondary | ICD-10-CM | POA: Diagnosis not present

## 2022-03-13 MED ORDER — ALCLOMETASONE DIPROPIONATE 0.05 % EX CREA: TOPICAL_CREAM | Freq: Two times a day (BID) | CUTANEOUS | 3 refills | Status: DC | PRN

## 2022-03-13 MED ORDER — KETOCONAZOLE 2 % EX CREA: 1.0000 "application " | TOPICAL_CREAM | Freq: Two times a day (BID) | CUTANEOUS | 10 refills | Status: AC

## 2022-03-13 NOTE — Progress Notes (Signed)
? ?  Follow-Up Visit ?  ?Subjective  ?Tim Day is a 71 y.o. male who presents for the following: Annual Exam (Here for annual skin exam Concerns itching on scalp. PCP called in ketoconazole. History of non mole skin cancers and atypical moles.). ? ? ?The following portions of the chart were reviewed this encounter and updated as appropriate:  Tobacco  Allergies  Meds  Problems  Med Hx  Surg Hx  Fam Hx   ?  ? ?Objective  ?Well appearing patient in no apparent distress; mood and affect are within normal limits. ? ?A full examination was performed including scalp, head, eyes, ears, nose, lips, neck, chest, axillae, abdomen, back, buttocks, bilateral upper extremities, bilateral lower extremities, hands, feet, fingers, toes, fingernails, and toenails. All findings within normal limits unless otherwise noted below. ? ?Waist up skin examination- No atypical nevi or signs of NMSC noted at the time of the visit.  ? ?Head - Anterior (Face), ears, chin and neck. ?Thin scaly erythematous papules and plaques.  ? ? ?Assessment & Plan  ?Encounter for screening for malignant neoplasm of skin ? ?6 months recheck ? ?Seborrheic dermatitis ?Head - Anterior (Face), ears, chin and neck. ? ?ketoconazole (NIZORAL) 2 % cream - Head - Anterior (Face), ears, chin and neck. ?Apply 1 application. topically in the morning and at bedtime. ? ?alclomethasone (ACLOVATE) 0.05 % cream - Head - Anterior (Face), ears, chin and neck. ?Apply topically 2 (two) times daily as needed (Rash). ? ? ? ?I, Leliana Kontz, PA-C, have reviewed all documentation's for this visit.  The documentation on 03/13/22 for the exam, diagnosis, procedures and orders are all accurate and complete. ?

## 2022-03-29 DIAGNOSIS — N390 Urinary tract infection, site not specified: Secondary | ICD-10-CM | POA: Diagnosis not present

## 2022-03-29 DIAGNOSIS — R319 Hematuria, unspecified: Secondary | ICD-10-CM | POA: Diagnosis not present

## 2022-03-29 DIAGNOSIS — R350 Frequency of micturition: Secondary | ICD-10-CM | POA: Diagnosis not present

## 2022-03-29 DIAGNOSIS — R300 Dysuria: Secondary | ICD-10-CM | POA: Diagnosis not present

## 2022-04-06 DIAGNOSIS — J449 Chronic obstructive pulmonary disease, unspecified: Secondary | ICD-10-CM | POA: Diagnosis not present

## 2022-04-06 DIAGNOSIS — I5040 Unspecified combined systolic (congestive) and diastolic (congestive) heart failure: Secondary | ICD-10-CM | POA: Diagnosis not present

## 2022-04-06 DIAGNOSIS — J9611 Chronic respiratory failure with hypoxia: Secondary | ICD-10-CM | POA: Diagnosis not present

## 2022-04-08 ENCOUNTER — Ambulatory Visit: Payer: HMO | Admitting: Internal Medicine

## 2022-04-08 ENCOUNTER — Encounter: Payer: Self-pay | Admitting: Internal Medicine

## 2022-04-08 DIAGNOSIS — U071 COVID-19: Secondary | ICD-10-CM

## 2022-04-08 DIAGNOSIS — N390 Urinary tract infection, site not specified: Secondary | ICD-10-CM | POA: Diagnosis not present

## 2022-04-08 DIAGNOSIS — R0609 Other forms of dyspnea: Secondary | ICD-10-CM | POA: Diagnosis not present

## 2022-04-08 DIAGNOSIS — J9611 Chronic respiratory failure with hypoxia: Secondary | ICD-10-CM

## 2022-04-08 NOTE — Progress Notes (Signed)
Tim Day, male    DOB: [birth year]    MRN: [id number] ? ? ?Brief patient profile:  ?58  yowm never smoker with DM then covid Jul 28 2020  ? ?Admit date: 08/19/2020 ?Discharge date: 09/28/2020 ?  ?Recommendations for Outpatient Follow-up:  ?Follow-up urology in 2 weeks for voiding trial ?Patient will be discharged with Foley catheter ?Continue oxygen 2 to 3 L/min via nasal cannula, wean off as tolerated ?Follow-up with ophthalmology as outpatient ?  ?Discharge Diagnoses:  ?Active Problems: ?  DM2 (diabetes mellitus, type 2) ([place]) ?  Benign essential HTN ?  Dyslipidemia ?  Chest tube in place ?  COVID-19 virus infection ?  Acute hypoxemic respiratory failure due to COVID-19 [facility]) ?  Pneumothorax, left ?  Pneumonia due to COVID-19 virus ?  Leg DVT (deep venous thromboembolism), acute, bilateral ([place]) ?  Pressure injury of skin ?  Acute respiratory failure with hypoxia ([place]) ?  HCAP (healthcare-associated pneumonia) ?  Primary spontaneous pneumothorax ?  Protein-calorie malnutrition, severe ?  Adjustment disorder with anxious mood ?  ? ?  ?History of present illness:  ?71 year old male with recent Covid pneumonia, 08/08/2020 -  03/10/8118, complicated by bilateral DVTs was admitted 08/19/2020 with left pneumothorax requiring chest tube placement and subsequent intubation 08/20/2020.  He was extubated following day but had persistent bilateral infiltrates requiring high flow nasal cannula 10 to 15 L/min oxygen.  Hospital course complicated by urinary retention, Foley catheter was inserted.  Also had episode of sudden loss of vision in right eye which has now resolved.  Neurology work-up was negative. ?  ?Hospital Course:  ?Acute hypoxemic and hypercarbic respiratory failure-secondary to recent COVID-19 pneumonia, s/p left pneumothorax.  Wean down to 2 L nasal cannula oxygen, continue to wean off as tolerated.  Continue to wean prednisone.  Will discharge on prednisone taper for 5 more days.  Continue as needed  albuterol inhaler. ?Acute urine retention-started on tamsulosin, Foley catheter.  Failed voiding trial x2.    Urology recommends to discharge patient with Foley catheter. Urology will see patient as outpatient. ?Bilateral DVTs- continue Eliquis. ?Diabetes mellitus type 2-continue Lantus, sliding scale insulin with NovoLog, linagliptin.  CBG well controlled. ?Depression/anxiety-continue Ativan as needed ?Sudden unilateral vision loss-resolved, patient had episode of sudden loss of vision right eye on 09/07/2020, he was unable to see anything for few seconds only, later on his vision came back but it remained blurry for 15 to 20 minutes.  CT head without contrast was negative.  MRI was unremarkable.  Bilateral carotid duplex was unremarkable.  Discussed with patient and his wife, he needs to follow-up with ophthalmology as outpatient. ?Left eye subconjunctival hemorrhage-stable ? ? ? ? ? ? ?History of Present Illness  ?02/09/2021  Pulmonary/ 1st office eval/Tim Day  ?Chief Complaint  ?Patient presents with  ? Consult  ?  Shortness of breath with a lot of exertion  ?Dyspnea:  Walks x 10 laps = maybe 6-8 min at slow pace at building / walks s 02 with  sats 86% ?Baseline faster walk s 02  ?Cough: some pnds mucus is clear ?Sleep: has sleep number bed but still in incliner with 30 degrees fine  ?SABA use: not helping ?02  1.5 hs conc and prn daytime but not titrating  ?rec ?Continue pantoprazole 40 mg Take 30-60 min before first meal of the day and add pepcid 20 mg after supper  ?GERD ?No need for inhalers  ?Make sure you check your oxygen saturation  at your highest level  of activity  to be sure it stays over 90% and adjust  02 flow upward to maintain this level if needed but remember to turn it back to previous settings when you stop (to conserve your supply).  ?Please schedule a follow up office visit in 6 weeks, call sooner if needed with PFT's on return ?  ? ? ? ?03/23/2021  f/u ov/[place] office/Tim Day re: Covid  pneumonia, 08/08/2020 -  1/94/1740, complicated by bilateral DVTs / 02 desp resp failure  ?Chief Complaint  ?Patient presents with  ? Follow-up  ?  Intermittent productive cough with clear phlegm  ? Dyspnea:  Walking up to an hour but stops because 02 drops 86% on 3lpm  ?Cough:  not a big problem / assoc with pnds sensation but no excess pnds/ sleep disturbance  ?Sleeping: no problem sleeping in recliner 30 degrees  ?SABA use: none  ?02: 3lpm 24/7  ?Covid status: 2nd shot feb 2022 pfizer  ?Rec ?Stop pantoprazole and change pepcid 20 mg after breakfast and supper x 1 week then just take one after supper x one week then stop  - if  flare go back one stop  ? ?  ? ?08/01/2021  f/u ov/[place] office/Tim Day re:  Covid pneumonia, 08/08/2020 -  07/15/4817, complicated by bilateral DVTs / 02 desp resp failure / SPN ?Chief Complaint  ?Patient presents with  ? Follow-up  ?  2L O2 when moving around. When sitting none is needed. Occasionally coughing up clear mucus.   ?Dyspnea:  walking an hour flat surface on 2 - 3lpm s stopping sats ok on 02 ?Cough: some pnds  ?Sleeping: recliner 30 degrees  ?SABA use: none  ?02: up to 3lpm with ex / ok at rest s 02/ / 2lpmhs  ?Covid status: vax x 2 / delta infection ?Needs surgical clearance for possible cholecystectomy, still has cholecystostomy drain but pain when clamps and surgery may be needed though nothing immediate needed  per Dr Arnoldo Morale pt has no stones to remove ?Rec ?To get the most out of exercise, you need to be continuously aware that you are short of breath, but never out of breath, for at least 30 minutes daily.   ?Make sure you check your oxygen saturations at highest level of activity to be sure they are maintaining above 90%  ?You  are cleared for surgery but will need early mobilization and Incentvive spirometry every hour before and after and minimal pain medications  ?   ? ?GB surgery x 2  ? ?10/02/2021  f/u ov/[place] office/Tim Day re: post covid pna resp failure   ?Chief  Complaint  ?Patient presents with  ? Follow-up  ?  From lobby to room pt O2 sats are at 87% on 3lpm. After sitting O2 sats increased to 92% 3lpm  ? ?Sob  and cough are the same as last visit per patient.   ?Dyspnea:  walking indoors x  3mn on 4lpm pulsed /  ?Cough: min mucoid ?Sleeping: recliner 30 degrees/ 2lpm conc ?SABA use: none  ?02: 2lpm and 4 lpm walking  ?Covid status: 2 total vax ?Lung cancer screening: never smoker  ?Rec ?Make sure you check your oxygen saturation  at your highest level of activity   ?Bicycle ergometer should be next to your concentrator  ?Incentive spirometry as much as possible   ?Please schedule a follow up visit in 3 months but call sooner if needed  ? ?10/02/2021 rec repeat venous dopplers since swelling is worse > done 10/05/2021 neg bilaterally ? ?  Admit date: 11/17/2021 ?Discharge date: 11/20/2021  ? A/P  ?Acute on chronic hypoxic / hypercarbic respiratory failure requiring intubation ?Suspected aspiration PNA  ?-Transition from unasyn to augmentin on 11/20/2021 and complete course and d/c ?-Extubated 12/17 ?  ?Septic shock  due to suspected aspiration PNA, with likely component of hypovolemia due to recent GI losses ?- Resolved. ?  ?Mild transaminitis -resolved ?-due to acute illness ?  ?Hyperkalemia ?-Resolved. ?  ?1.2 cm Left Apical lung nodule ?Hx of RLL nodule: Patient of Dr. Melvyn Novas ?CT chest scheduled for next week ?Close out patient follow up ?  ? ?01/01/2022  post hosp f/u ov/Tim Day re: post covid resp failure 02 dep  maint on 02   ?Chief Complaint  ?Patient presents with  ? Follow-up  ?  [place] admission for aspiration 11/17/2021. ?Feels breathing is improving since last OV.  ?2LO2 pulse and 1.5 LO2 at home.   ? Dyspnea:  walks up to 30 min and stops every 5 min when stops with sats in mid 80's and not titrating as rec  ?Also rides bike x 15 min  ?Cough: none / still hoarse but not much different than baseline  ?Sleeping: < 30 degrees in recliner flatter than he  was  ?SABA use: none  ?02: 1.5 at rest and sleeping/ 2lpm when walking  ?Covid status: vax x 2 / and covid infections  ?rec ?Keep up the exercise, just make sure you keep your saturations above 90% goal. ? ?04/09/19

## 2022-04-08 NOTE — Patient Instructions (Addendum)
Please remember to go to the lab department  for your tests - we will call you with the results when they are available. ?    ?My office will to schedule CT chest at [facility] next available  ? ? ? ?

## 2022-04-09 ENCOUNTER — Encounter: Payer: Self-pay | Admitting: Internal Medicine

## 2022-04-09 LAB — BASIC METABOLIC PANEL
BUN/Creatinine Ratio: 17 (ref 10–24)
BUN: 17 mg/dL (ref 8–27)
CO2: 28 mmol/L (ref 20–29)
Calcium: 9.8 mg/dL (ref 8.6–10.2)
Chloride: 98 mmol/L (ref 96–106)
Creatinine, Ser: 0.98 mg/dL (ref 0.76–1.27)
Glucose: 128 mg/dL — ABNORMAL HIGH (ref 70–99)
Potassium: 5.1 mmol/L (ref 3.5–5.2)
Sodium: 141 mmol/L (ref 134–144)
eGFR: 84 mL/min/{1.73_m2} (ref >59)

## 2022-04-09 LAB — CBC WITH DIFFERENTIAL/PLATELET
Basophils Absolute: 0 10*3/uL (ref 0.0–0.2)
Basos: 0 %
EOS (ABSOLUTE): 0.1 10*3/uL (ref 0.0–0.4)
Eos: 1 %
Hematocrit: 45.7 % (ref 37.5–51.0)
Hemoglobin: 14.2 g/dL (ref 13.0–17.7)
Immature Grans (Abs): 0 10*3/uL (ref 0.0–0.1)
Immature Granulocytes: 0 %
Lymphocytes Absolute: 1.1 10*3/uL (ref 0.7–3.1)
Lymphs: 16 %
MCH: 26.4 pg — ABNORMAL LOW (ref 26.6–33.0)
MCHC: 31.1 g/dL — ABNORMAL LOW (ref 31.5–35.7)
MCV: 85 fL (ref 79–97)
Monocytes Absolute: 0.5 10*3/uL (ref 0.1–0.9)
Monocytes: 7 %
Neutrophils Absolute: 5.2 10*3/uL (ref 1.4–7.0)
Neutrophils: 76 %
Platelets: 179 10*3/uL (ref 150–450)
RBC: 5.38 x10E6/uL (ref 4.14–5.80)
RDW: 14.4 % (ref 11.6–15.4)
WBC: 7 10*3/uL (ref 3.4–10.8)

## 2022-04-09 LAB — BRAIN NATRIURETIC PEPTIDE: BNP: 81 pg/mL (ref 0.0–100.0)

## 2022-04-09 LAB — D-DIMER, QUANTITATIVE: D-DIMER: 0.3 mg/L FEU (ref 0.00–0.49)

## 2022-04-09 LAB — SEDIMENTATION RATE: Sed Rate: 4 mm/hr (ref 0–30)

## 2022-04-09 NOTE — Assessment & Plan Note (Signed)
Onset with covid pna Aug 2021  ?- desats on RA p 100 ft 02/09/2021 > titrate to sats > 90% using POC max flow of 6lpm  ?- 10/02/2021 patient walked at a slow pace on 2lpm. During the second lap he reported SOB and desat to 87%. Increased oxygen on second lap to 3lpm POC  and recovered to 92%. ? ? ?Again advised:  ?Make sure you check your oxygen saturation  AT  your highest level of activity (not after you stop)   to be sure it stays over 90% and adjust  02 flow upward to maintain this level if needed but remember to turn it back to previous settings when you stop (to conserve your supply).  ? ?    ?  ? ?Each maintenance medication was reviewed in detail including emphasizing most importantly the difference between maintenance and prns and under what circumstances the prns are to be triggered using an action plan format where appropriate. ? ?Total time for H and P, chart review, counseling, reviewing 02  device(s) and generating customized AVS unique to this office visit / same day charting  > 30 min  ?     ?

## 2022-04-09 NOTE — Assessment & Plan Note (Signed)
Onset of illness 07/22/20 c/b bilateral dvt and L PTX / 02 dep at d/c 09/28/20  ?-  02/09/2021   Walked RA  approx   100 ft  @ slow pace  stopped due to  desats to 87% corrected to 90% on 1lpm  POC x another 100 ft then desat again   ?- PFT's  03/07/21   FEV1 1.26 (37 % ) ratio 0.92  p 4 % improvement from saba p 0 prior to study with DLCO  11.40 (46%) corrects to 3.09 (128%)  for alv volume and FV curve nl   ?- CTa 04/04/21 ?1. No evidence of pulmonary embolism. ?2. Findings suggestive of mild to moderate severity interstitial ?edema. A superimposed infectious component cannot be excluded. ?3. Stable posterior left upper lobe noncalcified lung nodule versus ?scarring.   ?- HRCT 04/09/2022 >>>  ? ?No clear reason for worsening 02 needs though he has gained 11 lbs since previous ov and he has some fluid retention, his bnp is still < 100 and no evidence of chf/ PE or active inflammation by today's labs ? ?Next step is HRCT then consider repeat pfts or refer to PF clinic if indicated. ?

## 2022-04-11 DIAGNOSIS — J449 Chronic obstructive pulmonary disease, unspecified: Secondary | ICD-10-CM | POA: Diagnosis not present

## 2022-04-11 DIAGNOSIS — I5040 Unspecified combined systolic (congestive) and diastolic (congestive) heart failure: Secondary | ICD-10-CM | POA: Diagnosis not present

## 2022-04-11 DIAGNOSIS — J9611 Chronic respiratory failure with hypoxia: Secondary | ICD-10-CM | POA: Diagnosis not present

## 2022-04-19 DIAGNOSIS — E782 Mixed hyperlipidemia: Secondary | ICD-10-CM | POA: Diagnosis not present

## 2022-04-19 DIAGNOSIS — R809 Proteinuria, unspecified: Secondary | ICD-10-CM | POA: Diagnosis not present

## 2022-04-19 DIAGNOSIS — Z125 Encounter for screening for malignant neoplasm of prostate: Secondary | ICD-10-CM | POA: Diagnosis not present

## 2022-04-19 DIAGNOSIS — E1165 Type 2 diabetes mellitus with hyperglycemia: Secondary | ICD-10-CM | POA: Diagnosis not present

## 2022-04-22 ENCOUNTER — Ambulatory Visit (HOSPITAL_COMMUNITY)
Admission: RE | Admit: 2022-04-22 | Discharge: 2022-04-22 | Disposition: A | Discharge status: Home / Self Care | Payer: HMO | Source: Ambulatory Visit | Attending: Internal Medicine | Admitting: Internal Medicine

## 2022-04-22 DIAGNOSIS — J9611 Chronic respiratory failure with hypoxia: Secondary | ICD-10-CM | POA: Diagnosis not present

## 2022-04-22 DIAGNOSIS — I358 Other nonrheumatic aortic valve disorders: Secondary | ICD-10-CM | POA: Diagnosis not present

## 2022-04-22 DIAGNOSIS — I2510 Atherosclerotic heart disease of native coronary artery without angina pectoris: Secondary | ICD-10-CM | POA: Diagnosis not present

## 2022-04-22 DIAGNOSIS — I517 Cardiomegaly: Secondary | ICD-10-CM | POA: Diagnosis not present

## 2022-04-22 DIAGNOSIS — J84112 Idiopathic pulmonary fibrosis: Secondary | ICD-10-CM | POA: Diagnosis not present

## 2022-04-24 ENCOUNTER — Telehealth: Payer: Self-pay | Admitting: Internal Medicine

## 2022-04-24 NOTE — Telephone Encounter (Signed)
ATC back. VM full.

## 2022-04-25 DIAGNOSIS — E782 Mixed hyperlipidemia: Secondary | ICD-10-CM | POA: Diagnosis not present

## 2022-04-25 DIAGNOSIS — R809 Proteinuria, unspecified: Secondary | ICD-10-CM | POA: Diagnosis not present

## 2022-04-25 DIAGNOSIS — M542 Cervicalgia: Secondary | ICD-10-CM | POA: Diagnosis not present

## 2022-04-25 DIAGNOSIS — J9611 Chronic respiratory failure with hypoxia: Secondary | ICD-10-CM | POA: Diagnosis not present

## 2022-04-25 DIAGNOSIS — I10 Essential (primary) hypertension: Secondary | ICD-10-CM | POA: Diagnosis not present

## 2022-04-25 DIAGNOSIS — M21371 Foot drop, right foot: Secondary | ICD-10-CM | POA: Diagnosis not present

## 2022-04-25 DIAGNOSIS — J9621 Acute and chronic respiratory failure with hypoxia: Secondary | ICD-10-CM | POA: Diagnosis not present

## 2022-04-25 DIAGNOSIS — M7989 Other specified soft tissue disorders: Secondary | ICD-10-CM | POA: Diagnosis not present

## 2022-04-25 DIAGNOSIS — Z0001 Encounter for general adult medical examination with abnormal findings: Secondary | ICD-10-CM | POA: Diagnosis not present

## 2022-04-25 DIAGNOSIS — E1165 Type 2 diabetes mellitus with hyperglycemia: Secondary | ICD-10-CM | POA: Diagnosis not present

## 2022-04-25 DIAGNOSIS — K810 Acute cholecystitis: Secondary | ICD-10-CM | POA: Diagnosis not present

## 2022-04-25 DIAGNOSIS — I5042 Chronic combined systolic (congestive) and diastolic (congestive) heart failure: Secondary | ICD-10-CM | POA: Diagnosis not present

## 2022-04-25 NOTE — Telephone Encounter (Signed)
Patients wife Inez Catalina came into office yesterday to get results. Went over results with her and advised her to have patient call with any questions/concerns. Nothing further needed. F/u appt made for patient.

## 2022-05-04 ENCOUNTER — Other Ambulatory Visit (HOSPITAL_COMMUNITY): Payer: Self-pay | Admitting: Nurse Practitioner

## 2022-05-04 ENCOUNTER — Ambulatory Visit (HOSPITAL_COMMUNITY)
Admission: RE | Admit: 2022-05-04 | Discharge: 2022-05-04 | Disposition: A | Discharge status: Home / Self Care | Payer: HMO | Source: Ambulatory Visit | Attending: Nurse Practitioner | Admitting: Nurse Practitioner

## 2022-05-04 DIAGNOSIS — U099 Post covid-19 condition, unspecified: Secondary | ICD-10-CM | POA: Diagnosis present

## 2022-05-04 DIAGNOSIS — R0602 Shortness of breath: Secondary | ICD-10-CM

## 2022-05-04 DIAGNOSIS — R0902 Hypoxemia: Secondary | ICD-10-CM | POA: Diagnosis not present

## 2022-05-04 DIAGNOSIS — R7989 Other specified abnormal findings of blood chemistry: Secondary | ICD-10-CM | POA: Diagnosis not present

## 2022-05-04 DIAGNOSIS — F32A Depression, unspecified: Secondary | ICD-10-CM | POA: Diagnosis present

## 2022-05-04 DIAGNOSIS — Z85820 Personal history of malignant melanoma of skin: Secondary | ICD-10-CM | POA: Diagnosis not present

## 2022-05-04 DIAGNOSIS — Z85828 Personal history of other malignant neoplasm of skin: Secondary | ICD-10-CM | POA: Diagnosis not present

## 2022-05-04 DIAGNOSIS — J9601 Acute respiratory failure with hypoxia: Secondary | ICD-10-CM | POA: Diagnosis not present

## 2022-05-04 DIAGNOSIS — I959 Hypotension, unspecified: Secondary | ICD-10-CM | POA: Diagnosis not present

## 2022-05-04 DIAGNOSIS — R0600 Dyspnea, unspecified: Secondary | ICD-10-CM | POA: Diagnosis not present

## 2022-05-04 DIAGNOSIS — M7989 Other specified soft tissue disorders: Secondary | ICD-10-CM | POA: Diagnosis not present

## 2022-05-04 DIAGNOSIS — Z8249 Family history of ischemic heart disease and other diseases of the circulatory system: Secondary | ICD-10-CM | POA: Diagnosis not present

## 2022-05-04 DIAGNOSIS — Z833 Family history of diabetes mellitus: Secondary | ICD-10-CM | POA: Diagnosis not present

## 2022-05-04 DIAGNOSIS — J690 Pneumonitis due to inhalation of food and vomit: Secondary | ICD-10-CM | POA: Diagnosis present

## 2022-05-04 DIAGNOSIS — I10 Essential (primary) hypertension: Secondary | ICD-10-CM | POA: Diagnosis not present

## 2022-05-04 DIAGNOSIS — Z794 Long term (current) use of insulin: Secondary | ICD-10-CM | POA: Diagnosis not present

## 2022-05-04 DIAGNOSIS — I82411 Acute embolism and thrombosis of right femoral vein: Secondary | ICD-10-CM | POA: Diagnosis present

## 2022-05-04 DIAGNOSIS — I5042 Chronic combined systolic (congestive) and diastolic (congestive) heart failure: Secondary | ICD-10-CM | POA: Diagnosis not present

## 2022-05-04 DIAGNOSIS — I5040 Unspecified combined systolic (congestive) and diastolic (congestive) heart failure: Secondary | ICD-10-CM | POA: Diagnosis not present

## 2022-05-04 DIAGNOSIS — J9621 Acute and chronic respiratory failure with hypoxia: Secondary | ICD-10-CM | POA: Diagnosis present

## 2022-05-04 DIAGNOSIS — E119 Type 2 diabetes mellitus without complications: Secondary | ICD-10-CM | POA: Diagnosis present

## 2022-05-04 DIAGNOSIS — Z7984 Long term (current) use of oral hypoglycemic drugs: Secondary | ICD-10-CM | POA: Diagnosis not present

## 2022-05-04 DIAGNOSIS — F419 Anxiety disorder, unspecified: Secondary | ICD-10-CM | POA: Diagnosis present

## 2022-05-04 DIAGNOSIS — R069 Unspecified abnormalities of breathing: Secondary | ICD-10-CM | POA: Diagnosis not present

## 2022-05-04 DIAGNOSIS — E876 Hypokalemia: Secondary | ICD-10-CM | POA: Diagnosis not present

## 2022-05-04 DIAGNOSIS — J189 Pneumonia, unspecified organism: Secondary | ICD-10-CM | POA: Diagnosis not present

## 2022-05-04 DIAGNOSIS — J449 Chronic obstructive pulmonary disease, unspecified: Secondary | ICD-10-CM | POA: Diagnosis not present

## 2022-05-04 DIAGNOSIS — Z20822 Contact with and (suspected) exposure to covid-19: Secondary | ICD-10-CM | POA: Diagnosis present

## 2022-05-04 DIAGNOSIS — J9622 Acute and chronic respiratory failure with hypercapnia: Secondary | ICD-10-CM | POA: Diagnosis present

## 2022-05-04 DIAGNOSIS — J9602 Acute respiratory failure with hypercapnia: Secondary | ICD-10-CM | POA: Diagnosis not present

## 2022-05-04 DIAGNOSIS — R911 Solitary pulmonary nodule: Secondary | ICD-10-CM | POA: Diagnosis present

## 2022-05-04 DIAGNOSIS — Z7982 Long term (current) use of aspirin: Secondary | ICD-10-CM | POA: Diagnosis not present

## 2022-05-04 DIAGNOSIS — E782 Mixed hyperlipidemia: Secondary | ICD-10-CM | POA: Diagnosis present

## 2022-05-04 DIAGNOSIS — J168 Pneumonia due to other specified infectious organisms: Secondary | ICD-10-CM | POA: Diagnosis not present

## 2022-05-04 DIAGNOSIS — I110 Hypertensive heart disease with heart failure: Secondary | ICD-10-CM | POA: Diagnosis present

## 2022-05-04 DIAGNOSIS — J9611 Chronic respiratory failure with hypoxia: Secondary | ICD-10-CM | POA: Diagnosis not present

## 2022-05-04 DIAGNOSIS — K219 Gastro-esophageal reflux disease without esophagitis: Secondary | ICD-10-CM | POA: Diagnosis present

## 2022-05-04 DIAGNOSIS — E1165 Type 2 diabetes mellitus with hyperglycemia: Secondary | ICD-10-CM | POA: Diagnosis not present

## 2022-05-04 DIAGNOSIS — I272 Pulmonary hypertension, unspecified: Secondary | ICD-10-CM | POA: Diagnosis present

## 2022-05-04 DIAGNOSIS — I5033 Acute on chronic diastolic (congestive) heart failure: Secondary | ICD-10-CM | POA: Diagnosis present

## 2022-05-04 DIAGNOSIS — J9811 Atelectasis: Secondary | ICD-10-CM | POA: Diagnosis not present

## 2022-05-04 DIAGNOSIS — J8410 Pulmonary fibrosis, unspecified: Secondary | ICD-10-CM | POA: Diagnosis present

## 2022-05-04 DIAGNOSIS — R600 Localized edema: Secondary | ICD-10-CM | POA: Diagnosis not present

## 2022-05-06 ENCOUNTER — Encounter (HOSPITAL_COMMUNITY): Payer: Self-pay | Admitting: *Deleted

## 2022-05-06 ENCOUNTER — Encounter (HOSPITAL_COMMUNITY): Payer: Self-pay | Admitting: Physical Therapy

## 2022-05-06 ENCOUNTER — Other Ambulatory Visit: Payer: Self-pay

## 2022-05-06 ENCOUNTER — Emergency Department (HOSPITAL_COMMUNITY): Payer: HMO

## 2022-05-06 ENCOUNTER — Inpatient Hospital Stay (HOSPITAL_COMMUNITY)
Admission: EM | Admit: 2022-05-06 | Discharge: 2022-05-11 | DRG: 291 | Disposition: A | Discharge status: Home / Self Care | Payer: HMO | Attending: Internal Medicine | Admitting: Internal Medicine

## 2022-05-06 ENCOUNTER — Inpatient Hospital Stay (HOSPITAL_COMMUNITY): Payer: HMO

## 2022-05-06 DIAGNOSIS — G8929 Other chronic pain: Secondary | ICD-10-CM | POA: Diagnosis present

## 2022-05-06 DIAGNOSIS — E876 Hypokalemia: Secondary | ICD-10-CM | POA: Diagnosis not present

## 2022-05-06 DIAGNOSIS — Z794 Long term (current) use of insulin: Secondary | ICD-10-CM

## 2022-05-06 DIAGNOSIS — F419 Anxiety disorder, unspecified: Secondary | ICD-10-CM | POA: Diagnosis present

## 2022-05-06 DIAGNOSIS — Z86718 Personal history of other venous thrombosis and embolism: Secondary | ICD-10-CM

## 2022-05-06 DIAGNOSIS — I82411 Acute embolism and thrombosis of right femoral vein: Secondary | ICD-10-CM | POA: Diagnosis present

## 2022-05-06 DIAGNOSIS — R911 Solitary pulmonary nodule: Secondary | ICD-10-CM | POA: Diagnosis present

## 2022-05-06 DIAGNOSIS — I110 Hypertensive heart disease with heart failure: Principal | ICD-10-CM | POA: Diagnosis present

## 2022-05-06 DIAGNOSIS — I272 Pulmonary hypertension, unspecified: Secondary | ICD-10-CM | POA: Diagnosis present

## 2022-05-06 DIAGNOSIS — E782 Mixed hyperlipidemia: Secondary | ICD-10-CM | POA: Diagnosis present

## 2022-05-06 DIAGNOSIS — Z20822 Contact with and (suspected) exposure to covid-19: Secondary | ICD-10-CM | POA: Diagnosis present

## 2022-05-06 DIAGNOSIS — Z85828 Personal history of other malignant neoplasm of skin: Secondary | ICD-10-CM | POA: Diagnosis not present

## 2022-05-06 DIAGNOSIS — J9621 Acute and chronic respiratory failure with hypoxia: Secondary | ICD-10-CM | POA: Diagnosis present

## 2022-05-06 DIAGNOSIS — E119 Type 2 diabetes mellitus without complications: Secondary | ICD-10-CM | POA: Diagnosis present

## 2022-05-06 DIAGNOSIS — Z833 Family history of diabetes mellitus: Secondary | ICD-10-CM

## 2022-05-06 DIAGNOSIS — J8410 Pulmonary fibrosis, unspecified: Secondary | ICD-10-CM | POA: Diagnosis present

## 2022-05-06 DIAGNOSIS — Z79899 Other long term (current) drug therapy: Secondary | ICD-10-CM

## 2022-05-06 DIAGNOSIS — J189 Pneumonia, unspecified organism: Secondary | ICD-10-CM | POA: Diagnosis not present

## 2022-05-06 DIAGNOSIS — J9622 Acute and chronic respiratory failure with hypercapnia: Secondary | ICD-10-CM | POA: Diagnosis present

## 2022-05-06 DIAGNOSIS — I5033 Acute on chronic diastolic (congestive) heart failure: Secondary | ICD-10-CM | POA: Diagnosis present

## 2022-05-06 DIAGNOSIS — I10 Essential (primary) hypertension: Secondary | ICD-10-CM | POA: Diagnosis not present

## 2022-05-06 DIAGNOSIS — J9602 Acute respiratory failure with hypercapnia: Secondary | ICD-10-CM | POA: Diagnosis not present

## 2022-05-06 DIAGNOSIS — Z7984 Long term (current) use of oral hypoglycemic drugs: Secondary | ICD-10-CM | POA: Diagnosis not present

## 2022-05-06 DIAGNOSIS — Z7982 Long term (current) use of aspirin: Secondary | ICD-10-CM | POA: Diagnosis not present

## 2022-05-06 DIAGNOSIS — J9691 Respiratory failure, unspecified with hypoxia: Secondary | ICD-10-CM | POA: Diagnosis present

## 2022-05-06 DIAGNOSIS — J9601 Acute respiratory failure with hypoxia: Principal | ICD-10-CM

## 2022-05-06 DIAGNOSIS — Z8249 Family history of ischemic heart disease and other diseases of the circulatory system: Secondary | ICD-10-CM

## 2022-05-06 DIAGNOSIS — U099 Post covid-19 condition, unspecified: Secondary | ICD-10-CM | POA: Diagnosis present

## 2022-05-06 DIAGNOSIS — K219 Gastro-esophageal reflux disease without esophagitis: Secondary | ICD-10-CM | POA: Diagnosis present

## 2022-05-06 DIAGNOSIS — Z85820 Personal history of malignant melanoma of skin: Secondary | ICD-10-CM

## 2022-05-06 DIAGNOSIS — F5101 Primary insomnia: Secondary | ICD-10-CM | POA: Diagnosis present

## 2022-05-06 DIAGNOSIS — F32A Depression, unspecified: Secondary | ICD-10-CM | POA: Diagnosis present

## 2022-05-06 DIAGNOSIS — J690 Pneumonitis due to inhalation of food and vomit: Secondary | ICD-10-CM | POA: Diagnosis present

## 2022-05-06 LAB — CBC WITH DIFFERENTIAL/PLATELET
Abs Immature Granulocytes: 0.04 10*3/uL (ref 0.00–0.07)
Basophils Absolute: 0 10*3/uL (ref 0.0–0.1)
Basophils Relative: 0 %
Eosinophils Absolute: 0 10*3/uL (ref 0.0–0.5)
Eosinophils Relative: 0 %
HCT: 47.8 % (ref 39.0–52.0)
Hemoglobin: 13.8 g/dL (ref 13.0–17.0)
Immature Granulocytes: 1 %
Lymphocytes Relative: 9 %
Lymphs Abs: 0.5 10*3/uL — ABNORMAL LOW (ref 0.7–4.0)
MCH: 26.1 pg (ref 26.0–34.0)
MCHC: 28.9 g/dL — ABNORMAL LOW (ref 30.0–36.0)
MCV: 90.5 fL (ref 80.0–100.0)
Monocytes Absolute: 0.4 10*3/uL (ref 0.1–1.0)
Monocytes Relative: 8 %
Neutro Abs: 4.5 10*3/uL (ref 1.7–7.7)
Neutrophils Relative %: 82 %
Platelets: 149 10*3/uL — ABNORMAL LOW (ref 150–400)
RBC: 5.28 MIL/uL (ref 4.22–5.81)
RDW: 15.9 % — ABNORMAL HIGH (ref 11.5–15.5)
WBC: 5.6 10*3/uL (ref 4.0–10.5)
nRBC: 0 % (ref 0.0–0.2)

## 2022-05-06 LAB — COMPREHENSIVE METABOLIC PANEL
ALT: 23 U/L (ref 0–44)
AST: 19 U/L (ref 15–41)
Albumin: 3.5 g/dL (ref 3.5–5.0)
Alkaline Phosphatase: 49 U/L (ref 38–126)
Anion gap: 7 (ref 5–15)
BUN: 33 mg/dL — ABNORMAL HIGH (ref 8–23)
CO2: 30 mmol/L (ref 22–32)
Calcium: 8.7 mg/dL — ABNORMAL LOW (ref 8.9–10.3)
Chloride: 100 mmol/L (ref 98–111)
Creatinine, Ser: 0.89 mg/dL (ref 0.61–1.24)
GFR, Estimated: >60 mL/min (ref >60)
Glucose, Bld: 155 mg/dL — ABNORMAL HIGH (ref 70–99)
Potassium: 4.3 mmol/L (ref 3.5–5.1)
Sodium: 137 mmol/L (ref 135–145)
Total Bilirubin: 0.8 mg/dL (ref 0.3–1.2)
Total Protein: 6.8 g/dL (ref 6.5–8.1)

## 2022-05-06 LAB — MAGNESIUM: Magnesium: 2 mg/dL (ref 1.7–2.4)

## 2022-05-06 LAB — BLOOD GAS, ARTERIAL
Acid-Base Excess: 4.8 mmol/L — ABNORMAL HIGH (ref 0.0–2.0)
Acid-Base Excess: 7.6 mmol/L — ABNORMAL HIGH (ref 0.0–2.0)
Bicarbonate: 34.6 mmol/L — ABNORMAL HIGH (ref 20.0–28.0)
Bicarbonate: 38.2 mmol/L — ABNORMAL HIGH (ref 20.0–28.0)
Drawn by: 41977
Drawn by: 38235
FIO2: 40 %
FIO2: 40 %
O2 Saturation: 94.7 %
O2 Saturation: 97.4 %
Patient temperature: 37
Patient temperature: 37
pCO2 arterial: 77 mmHg (ref 32–48)
pCO2 arterial: 87 mmHg (ref 32–48)
pH, Arterial: 7.26 — ABNORMAL LOW (ref 7.35–7.45)
pH, Arterial: 7.25 — ABNORMAL LOW (ref 7.35–7.45)
pO2, Arterial: 78 mmHg — ABNORMAL LOW (ref 83–108)
pO2, Arterial: 90 mmHg (ref 83–108)

## 2022-05-06 LAB — GLUCOSE, CAPILLARY
Glucose-Capillary: 148 mg/dL — ABNORMAL HIGH (ref 70–99)
Glucose-Capillary: 171 mg/dL — ABNORMAL HIGH (ref 70–99)
Glucose-Capillary: 154 mg/dL — ABNORMAL HIGH (ref 70–99)

## 2022-05-06 LAB — RESP PANEL BY RT-PCR (FLU A&B, COVID) ARPGX2
Influenza A by PCR: NEGATIVE
Influenza B by PCR: NEGATIVE
SARS Coronavirus 2 by RT PCR: NEGATIVE

## 2022-05-06 LAB — BLOOD GAS, VENOUS
Acid-Base Excess: 4.6 mmol/L — ABNORMAL HIGH (ref 0.0–2.0)
Acid-Base Excess: 8.4 mmol/L — ABNORMAL HIGH (ref 0.0–2.0)
Bicarbonate: 34.7 mmol/L — ABNORMAL HIGH (ref 20.0–28.0)
Bicarbonate: 39.8 mmol/L — ABNORMAL HIGH (ref 20.0–28.0)
Drawn by: 46492
Drawn by: 46492
FIO2: 40 %
O2 Saturation: 96.6 %
O2 Saturation: 97.3 %
Patient temperature: 36.5
Patient temperature: 36.7
pCO2, Ven: 79 mmHg (ref 44–60)
pCO2, Ven: 94 mmHg (ref 44–60)
pH, Ven: 7.25 (ref 7.25–7.43)
pH, Ven: 7.23 — ABNORMAL LOW (ref 7.25–7.43)
pO2, Ven: 94 mmHg — ABNORMAL HIGH (ref 32–45)
pO2, Ven: 79 mmHg — ABNORMAL HIGH (ref 32–45)

## 2022-05-06 LAB — TROPONIN I (HIGH SENSITIVITY)
Troponin I (High Sensitivity): 15 ng/L (ref <18)
Troponin I (High Sensitivity): 16 ng/L (ref <18)

## 2022-05-06 LAB — D-DIMER, QUANTITATIVE: D-Dimer, Quant: 1.09 ug/mL-FEU — ABNORMAL HIGH (ref 0.00–0.50)

## 2022-05-06 LAB — PROTIME-INR
INR: 1.3 — ABNORMAL HIGH (ref 0.8–1.2)
Prothrombin Time: 15.8 seconds — ABNORMAL HIGH (ref 11.4–15.2)

## 2022-05-06 LAB — MRSA NEXT GEN BY PCR, NASAL: MRSA by PCR Next Gen: DETECTED — AB

## 2022-05-06 LAB — BRAIN NATRIURETIC PEPTIDE: B Natriuretic Peptide: 375 pg/mL — ABNORMAL HIGH (ref 0.0–100.0)

## 2022-05-06 MED ORDER — IOHEXOL 350 MG/ML SOLN
80.0000 mL | Freq: Once | INTRAVENOUS | Status: AC | PRN
  Administered 2022-05-06: 80 mL via INTRAVENOUS

## 2022-05-06 MED ORDER — FUROSEMIDE 10 MG/ML IJ SOLN
40.0000 mg | Freq: Two times a day (BID) | INTRAMUSCULAR | Status: DC
  Administered 2022-05-06 – 2022-05-11 (×10): 40 mg via INTRAVENOUS
  Filled 2022-05-06 (×10): qty 4

## 2022-05-06 MED ORDER — ALBUTEROL SULFATE (2.5 MG/3ML) 0.083% IN NEBU: 2.5000 mg | INHALATION_SOLUTION | RESPIRATORY_TRACT | Status: DC | PRN

## 2022-05-06 MED ORDER — ACETAMINOPHEN 325 MG PO TABS: 650.0000 mg | ORAL_TABLET | Freq: Four times a day (QID) | ORAL | Status: DC | PRN

## 2022-05-06 MED ORDER — INSULIN GLARGINE-YFGN 100 UNIT/ML ~~LOC~~ SOLN
10.0000 [IU] | Freq: Every morning | SUBCUTANEOUS | Status: DC
  Administered 2022-05-07 – 2022-05-11 (×5): 10 [IU] via SUBCUTANEOUS
  Filled 2022-05-06 (×6): qty 0.1

## 2022-05-06 MED ORDER — SODIUM CHLORIDE 0.9 % IV SOLN: 1.0000 g | INTRAVENOUS | Status: DC

## 2022-05-06 MED ORDER — SODIUM CHLORIDE 0.9% FLUSH: 3.0000 mL | INTRAVENOUS | Status: DC | PRN

## 2022-05-06 MED ORDER — METHYLPREDNISOLONE SODIUM SUCC 125 MG IJ SOLR
125.0000 mg | Freq: Once | INTRAMUSCULAR | Status: AC
  Administered 2022-05-06: 125 mg via INTRAVENOUS

## 2022-05-06 MED ORDER — SODIUM CHLORIDE 0.9 % IV SOLN: 500.0000 mg | INTRAVENOUS | Status: DC

## 2022-05-06 MED ORDER — SODIUM CHLORIDE 0.9 % IV SOLN: 250.0000 mL | INTRAVENOUS | Status: DC | PRN

## 2022-05-06 MED ORDER — CHLORHEXIDINE GLUCONATE CLOTH 2 % EX PADS: 6.0000 | MEDICATED_PAD | Freq: Every day | CUTANEOUS | Status: DC

## 2022-05-06 MED ORDER — ASPIRIN 81 MG PO TBEC
81.0000 mg | DELAYED_RELEASE_TABLET | Freq: Every day | ORAL | Status: DC
Start: 2022-05-07 — End: 2022-05-06

## 2022-05-06 MED ORDER — MUPIROCIN 2 % EX OINT
1.0000 "application " | TOPICAL_OINTMENT | Freq: Two times a day (BID) | CUTANEOUS | Status: AC
  Administered 2022-05-06 – 2022-05-11 (×10): 1 via NASAL
  Filled 2022-05-06 (×3): qty 22

## 2022-05-06 MED ORDER — ONDANSETRON HCL 4 MG/2ML IJ SOLN: 4.0000 mg | Freq: Four times a day (QID) | INTRAMUSCULAR | Status: DC | PRN

## 2022-05-06 MED ORDER — INSULIN ASPART 100 UNIT/ML IJ SOLN: 0.0000 [IU] | Freq: Every day | INTRAMUSCULAR | Status: DC

## 2022-05-06 MED ORDER — INSULIN ASPART 100 UNIT/ML IJ SOLN
0.0000 [IU] | INTRAMUSCULAR | Status: DC
  Administered 2022-05-06: 2 [IU] via SUBCUTANEOUS
  Administered 2022-05-06: 1 [IU] via SUBCUTANEOUS
  Administered 2022-05-06 – 2022-05-10 (×9): 2 [IU] via SUBCUTANEOUS
  Administered 2022-05-10: 3 [IU] via SUBCUTANEOUS
  Administered 2022-05-10: 5 [IU] via SUBCUTANEOUS
  Administered 2022-05-10: 1 [IU] via SUBCUTANEOUS
  Administered 2022-05-11 (×3): 2 [IU] via SUBCUTANEOUS
  Administered 2022-05-11: 1 [IU] via SUBCUTANEOUS

## 2022-05-06 MED ORDER — APIXABAN 5 MG PO TABS: 5.0000 mg | ORAL_TABLET | Freq: Two times a day (BID) | ORAL | Status: DC

## 2022-05-06 MED ORDER — ONDANSETRON HCL 4 MG PO TABS: 4.0000 mg | ORAL_TABLET | Freq: Four times a day (QID) | ORAL | Status: DC | PRN

## 2022-05-06 MED ORDER — APIXABAN 5 MG PO TABS: 10.0000 mg | ORAL_TABLET | Freq: Two times a day (BID) | ORAL | Status: DC

## 2022-05-06 MED ORDER — SODIUM CHLORIDE 0.9% FLUSH
3.0000 mL | Freq: Two times a day (BID) | INTRAVENOUS | Status: DC
  Administered 2022-05-06 – 2022-05-11 (×10): 3 mL via INTRAVENOUS

## 2022-05-06 MED ORDER — SODIUM CHLORIDE 0.9 % IV SOLN
500.0000 mg | INTRAVENOUS | Status: DC
  Administered 2022-05-06: 500 mg via INTRAVENOUS
  Filled 2022-05-06: qty 5

## 2022-05-06 MED ORDER — FUROSEMIDE 10 MG/ML IJ SOLN
20.0000 mg | Freq: Once | INTRAMUSCULAR | Status: AC
  Administered 2022-05-06: 20 mg via INTRAVENOUS
  Filled 2022-05-06: qty 2

## 2022-05-06 MED ORDER — IPRATROPIUM-ALBUTEROL 0.5-2.5 (3) MG/3ML IN SOLN
RESPIRATORY_TRACT | Status: AC
  Filled 2022-05-06: qty 3

## 2022-05-06 MED ORDER — ACETAMINOPHEN 650 MG RE SUPP: 650.0000 mg | Freq: Four times a day (QID) | RECTAL | Status: DC | PRN

## 2022-05-06 MED ORDER — INSULIN ASPART 100 UNIT/ML IJ SOLN: 0.0000 [IU] | Freq: Three times a day (TID) | INTRAMUSCULAR | Status: DC

## 2022-05-06 MED ORDER — SODIUM CHLORIDE 0.9 % IV SOLN
3.0000 g | Freq: Once | INTRAVENOUS | Status: AC
  Administered 2022-05-06: 3 g via INTRAVENOUS
  Filled 2022-05-06: qty 8

## 2022-05-06 MED ORDER — IPRATROPIUM-ALBUTEROL 0.5-2.5 (3) MG/3ML IN SOLN
3.0000 mL | Freq: Four times a day (QID) | RESPIRATORY_TRACT | Status: DC
  Administered 2022-05-06 – 2022-05-08 (×10): 3 mL via RESPIRATORY_TRACT
  Filled 2022-05-06 (×9): qty 3

## 2022-05-06 MED ORDER — INSULIN ASPART 100 UNIT/ML IJ SOLN: 3.0000 [IU] | Freq: Three times a day (TID) | INTRAMUSCULAR | Status: DC

## 2022-05-06 MED ORDER — HYDRALAZINE HCL 20 MG/ML IJ SOLN: 10.0000 mg | Freq: Four times a day (QID) | INTRAMUSCULAR | Status: DC | PRN

## 2022-05-06 MED ORDER — ENOXAPARIN SODIUM 100 MG/ML IJ SOSY
90.0000 mg | PREFILLED_SYRINGE | Freq: Two times a day (BID) | INTRAMUSCULAR | Status: DC
  Administered 2022-05-06 – 2022-05-07 (×3): 90 mg via SUBCUTANEOUS
  Filled 2022-05-06 (×3): qty 1

## 2022-05-06 MED ORDER — SODIUM CHLORIDE 0.9 % IV SOLN
500.0000 mg | INTRAVENOUS | Status: AC
  Administered 2022-05-07 – 2022-05-09 (×3): 500 mg via INTRAVENOUS
  Filled 2022-05-06 (×3): qty 5

## 2022-05-06 MED ORDER — SODIUM CHLORIDE 0.9 % IV SOLN
3.0000 g | Freq: Four times a day (QID) | INTRAVENOUS | Status: DC
  Administered 2022-05-06 – 2022-05-10 (×15): 3 g via INTRAVENOUS
  Filled 2022-05-06: qty 8
  Filled 2022-05-06: qty 3
  Filled 2022-05-06 (×2): qty 8
  Filled 2022-05-06: qty 3
  Filled 2022-05-06 (×3): qty 8
  Filled 2022-05-06: qty 3
  Filled 2022-05-06 (×3): qty 8
  Filled 2022-05-06: qty 3
  Filled 2022-05-06 (×7): qty 8

## 2022-05-06 MED ORDER — ENOXAPARIN SODIUM 40 MG/0.4ML IJ SOSY
40.0000 mg | PREFILLED_SYRINGE | INTRAMUSCULAR | Status: DC
  Filled 2022-05-06: qty 0.4

## 2022-05-06 MED ORDER — IPRATROPIUM-ALBUTEROL 0.5-2.5 (3) MG/3ML IN SOLN
3.0000 mL | Freq: Once | RESPIRATORY_TRACT | Status: AC
  Administered 2022-05-06: 3 mL via RESPIRATORY_TRACT

## 2022-05-06 NOTE — Progress Notes (Signed)
Repeat ABG with worsening hypercarbia-but patient completely awake/alert-and infact asking for water to drink. Discussed with PCCM MD at [facility] increase setting to 18/6 and decrease FIO2 35%-repeat ABG in 3 hours.

## 2022-05-06 NOTE — Progress Notes (Addendum)
ANTICOAGULATION CONSULT NOTE - Initial Consult  Pharmacy Consult for apixaban to Lovenox Indication: DVT  No Known Allergies  Patient Measurements: Height: '5\' 10"'$  (177.8 cm) Weight: 90.7 kg (200 lb) IBW/kg (Calculated) : 73 Heparin Dosing Weight: n/a  Vital Signs: Temp: 97.8 F (36.6 C) (06/05 1930) Temp Source: Axillary (06/05 1930) BP: 99/58 (06/05 2000) Pulse Rate: 99 (06/05 2000)  Labs: Recent Labs    05/06/22 1032 05/06/22 1154 05/06/22 1236  HGB 13.8  --   --   HCT 47.8  --   --   PLT 149*  --   --   CREATININE  --  0.89  --   TROPONINIHS 15  --  16    Estimated Creatinine Clearance: 90 mL/min (by C-G formula based on SCr of 0.89 mg/dL).   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   Primary insomnia    Seborrheic dermatitis, unspecified    Squamous cell carcinoma of skin 06/14/2011   left forearm medial cx3 47f   Thrombocytopenia, unspecified ([facility])     Medications:  Scheduled:   apixaban  10 mg Oral BID   Followed by   [Derrill MemoON 05/13/2022] apixaban  5 mg Oral BID   [START ON 05/07/2022] aspirin EC  81 mg Oral Daily   [START ON 05/07/2022] Chlorhexidine Gluconate Cloth  6 each Topical Q0600   furosemide  40 mg Intravenous Q12H   insulin aspart  0-9 Units Subcutaneous Q4H   [START ON 05/07/2022] insulin glargine-yfgn  10 Units Subcutaneous q AM   ipratropium-albuterol  3 mL Nebulization Q6H   ipratropium-albuterol       mupirocin ointment  1 application. Nasal BID   sodium chloride flush  3 mL Intravenous Q12H   Infusions:   sodium  chloride     ampicillin-sulbactam (UNASYN) IV     [START ON 05/07/2022] azithromycin      Assessment: 71yo male admitted for shortness of breath with lower extremity swelling. PMH significant for h/o VTE with patient compleeting anticoagulation treatment 07/2021. [place] nonocclusive DVT of right profunda femoris vein. Baaseline CBC wnl with H/H 13.8/47.8 and PLT 149. Awaiting baseline INR. No major DDI concerns when starting apixaban. CrCl 90 ml/min.   Goal of Therapy:  Monitor platelets by anticoagulation protocol: Yes   Plan:  Start apixaban at normal treatment dose: '10mg'$  BID x7days, then '5mg'$  BID.   VJordan HawksForrest 05/06/2022,9:31 PM   9:50 pm -- > change to Lovenox treatment dose - 90 Q12 Thank you LAnette Guarneri PharmD

## 2022-05-06 NOTE — Progress Notes (Signed)
Pharmacy Antibiotic Note  Tim Day is a 71 y.o. male admitted on 05/06/2022 with  aspiration pneumonia .  Pharmacy has been consulted for Unasyn dosing.  Plan: Unasyn 3000 mg IV every 6 hours.  Height: '5\' 10"'$  (177.8 cm) Weight: 90.7 kg (200 lb) IBW/kg (Calculated) : 73  Temp (24hrs), Avg:97.5 F (36.4 C), Min:97.5 F (36.4 C), Max:97.5 F (36.4 C)  Recent Labs  Lab 05/06/22 1032 05/06/22 1154  WBC 5.6  --   CREATININE  --  0.89    Estimated Creatinine Clearance: 90 mL/min (by C-G formula based on SCr of 0.89 mg/dL).    No Known Allergies  Antimicrobials this admission: Unasyn 6/5 >> Azith 6/5 >>    Microbiology results:  6/5 MRSA PCR: pending  Thank you for allowing pharmacy to be a part of this patient's care.  Ramond Craver 05/06/2022 4:00 PM

## 2022-05-06 NOTE — ED Triage Notes (Signed)
Pt brought in by ems for c/o sob x 2 days; pt has had to increase O2 to 4L and pt is usually on 2L

## 2022-05-06 NOTE — Progress Notes (Signed)
ABG obtained and delivered to lab at 1549
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
## 2022-05-06 NOTE — Progress Notes (Signed)
Date and time results received: 05/06/22 1720 (use smartphrase ".now" to insert current time)  Test: MRSA Critical Value: positive  Name of Provider Notified: Ghimire  Orders Received? Or Actions Taken?: Orders Received - See Orders for details

## 2022-05-06 NOTE — ED Provider Notes (Signed)
[facility] EMERGENCY DEPARTMENT Provider Note   CSN: [id number] Arrival date & time: 05/06/22  1017     History {Add pertinent medical, surgical, social history, OB history to HPI:1} No chief complaint on file.   Tim Day is a 71 y.o. male.  HPI Patient presents for shortness of breath.  Medical history includes DM 2, HTN, HLD, DVT, spontaneous pneumothorax, chronic hypoxia, GERD.  At baseline, patient is on 2 L of supplemental oxygen.  He began to feel increased shortness of breath yesterday.  Home oxygen was increased to 4 L.  Today, he had hypoxia despite 4 L of oxygen by nasal cannula.  When EMS arrived, he had sats in the 70s on his 4 L.  Patient was placed on nonrebreather with 8 L and had SPO2 in the mid 90s.  This would still drop when he exerted himself at all.  Throughout yesterday and today, patient denies any areas of pain.    Home Medications Prior to Admission medications   Medication Sig Start Date End Date Taking? Authorizing Provider  ACCU-CHEK GUIDE test strip USE TO TEST TWICE DAILY.D 02/06/21   [provider]  acetaminophen (TYLENOL) 500 MG tablet Take 1,000 mg by mouth 2 (two) times daily as needed for moderate pain.    [provider]  alclomethasone (ACLOVATE) 0.05 % cream Apply topically 2 (two) times daily as needed (Rash). 03/13/22   Sheffield, Ronalee Red, PA-C  ALPRAZolam (XANAX) 0.25 MG tablet Take 0.25 mg by mouth daily as needed for anxiety.    [provider]  alum & mag hydroxide-simeth (MAALOX/MYLANTA) 200-200-20 MG/5ML suspension Take 30 mLs by mouth every 4 (four) hours as needed for indigestion. 09/28/20   Oswald Hillock, MD  ascorbic acid (VITAMIN C) 500 MG tablet Take 1 tablet (500 mg total) by mouth daily. 08/04/20   Johnson, Clanford L, MD  aspirin EC 81 MG tablet Take 81 mg by mouth daily. Swallow whole.    [provider]  blood glucose meter kit and supplies KIT Dispense based on patient and insurance  preference. Use up to four times daily as directed. (FOR ICD-9 250.00, 250.01). 08/14/20   Little Ishikawa, MD  Cholecalciferol (VITAMIN D3) 125 MCG (5000 UT) CAPS Take 5,000 Units by mouth daily.    [provider]  ciprofloxacin (CIPRO) 500 MG tablet SMARTSIG:1 Tablet(s) By Mouth Every 12 Hours 04/03/22   [provider]  co-enzyme Q-10 50 MG capsule Take 50 mg by mouth 2 (two) times daily.    [provider]  dapagliflozin propanediol (FARXIGA) 10 MG TABS tablet Take 10 mg by mouth daily.    [provider]  Empagliflozin (JARDIANCE PO) Take 25 mg by mouth.    [provider]  famotidine (PEPCID) 20 MG tablet TAKE ONE TABLET BY MOUTH DAILY AFTER SUPPER 02/25/22   Tanda Rockers, MD  FIASP FLEXTOUCH 100 UNIT/ML FlexTouch Pen Inject 2-8 Units into the skin every evening. 02/26/21   [provider]  furosemide (LASIX) 20 MG tablet Take 20 mg by mouth daily as needed for edema. 03/28/21   [provider]  Insulin Pen Needle (PEN NEEDLES) 32G X 4 MM MISC 1 Package by Does not apply route 4 (four) times daily -  before meals and at bedtime. 08/14/20   Little Ishikawa, MD  ketoconazole (NIZORAL) 2 % shampoo Apply topically. 01/04/22   [provider]  Lancets (ONETOUCH DELICA PLUS [id number]) MISC Apply topically 2 (two) times daily.  12/07/21   [provider]  metFORMIN (GLUCOPHAGE) 1000 MG tablet Take 1,000 mg by mouth 2 (two) times daily with a meal.    [provider]  Multiple Vitamin (MULTIVITAMIN) capsule Take 1 capsule by mouth daily.    [provider]  olmesartan-hydrochlorothiazide (BENICAR HCT) 20-12.5 MG tablet Take 1 tablet by mouth daily. 03/05/21   [provider]  pantoprazole (PROTONIX) 40 MG tablet Take 40 mg by mouth daily.    [provider]  Phenylephrine HCl (SINEX REGULAR NA) Place 1 spray into the nose daily as needed (congestion).    [provider]   rosuvastatin (CRESTOR) 5 MG tablet Take 5 mg by mouth daily. 03/05/21   [provider]  sertraline (ZOLOFT) 50 MG tablet Take 50 mg by mouth at bedtime.    [provider]  tamsulosin (FLOMAX) 0.4 MG CAPS capsule Take 0.4 mg by mouth at bedtime. 01/28/22   [provider]  TOUJEO MAX SOLOSTAR 300 UNIT/ML Solostar Pen Inject 30 Units into the skin in the morning. 02/26/21   [provider]  zinc sulfate 220 (50 Zn) MG capsule Take 1 capsule (220 mg total) by mouth daily. 08/04/20   Johnson, Clanford L, MD  zolpidem (AMBIEN) 10 MG tablet Take 10 mg by mouth at bedtime. 02/27/21   [provider]      Allergies    Patient has no known allergies.    Review of Systems   Review of Systems  Constitutional:  Positive for fatigue.  Respiratory:  Positive for shortness of breath.   All other systems reviewed and are negative.  Physical Exam Updated Vital Signs There were no vitals taken for this visit. Physical Exam Vitals and nursing note reviewed.  Constitutional:      General: He is not in acute distress.    Appearance: He is well-developed. He is ill-appearing. He is not toxic-appearing.  HENT:     Head: Normocephalic and atraumatic.     Mouth/Throat:     Mouth: Mucous membranes are moist.     Pharynx: Oropharynx is clear.  Eyes:     Extraocular Movements: Extraocular movements intact.     Conjunctiva/sclera: Conjunctivae normal.  Cardiovascular:     Rate and Rhythm: Regular rhythm. Tachycardia present.     Heart sounds: No murmur heard. Pulmonary:     Effort: Pulmonary effort is normal. Tachypnea present. No accessory muscle usage or respiratory distress.     Breath sounds: Decreased breath sounds present.  Chest:     Chest wall: No tenderness.  Abdominal:     Palpations: Abdomen is soft.     Tenderness: There is no abdominal tenderness.  Musculoskeletal:        General: No swelling.     Cervical back: Normal range of motion and neck  supple.     Right lower leg: Edema present.     Left lower leg: Edema present.  Skin:    General: Skin is warm and dry.     Capillary Refill: Capillary refill takes less than 2 seconds.     Coloration: Skin is not cyanotic or pale.  Neurological:     General: No focal deficit present.     Mental Status: He is alert and oriented to person, place, and time.     Cranial Nerves: No cranial nerve deficit.     Motor: No weakness.  Psychiatric:        Mood and Affect: Mood normal.  Behavior: Behavior normal.    ED Results / Procedures / Treatments   Labs (all labs ordered are listed, but only abnormal results are displayed) Labs Reviewed - No data to display  EKG None  Radiology DG Chest 2 View  Result Date: 05/05/2022 CLINICAL DATA:  dyspnea EXAM: CHEST - 2 VIEW COMPARISON:  Chest radiograph dated November 19, 2021; CT dated Apr 22, 2022 FINDINGS: The cardiomediastinal silhouette is unchanged in contour.Low lung volumes. Unchanged LEFT apical pulmonary nodule, better evaluated on prior CT. No pleural effusion. No pneumothorax. Diffuse coarse interstitial opacities with a basilar predominance. Visualized abdomen is unremarkable. Mild degenerative changes of the thoracic spine. IMPRESSION: Revisualization of pulmonary fibrosis. No definitive superimposed acute component is identified. Electronically Signed   By: Valentino Saxon M.D.   On: 05/05/2022 19:25    Procedures Procedures  {Document cardiac monitor, telemetry assessment procedure when appropriate:1}  Medications Ordered in ED Medications - No data to display  ED Course/ Medical Decision Making/ A&P                           Medical Decision Making  ***  {Document critical care time when appropriate:1} {Document review of labs and clinical decision tools ie heart score, Chads2Vasc2 etc:1}  {Document your independent review of radiology images, and any outside records:1} {Document your discussion with family  members, caretakers, and with consultants:1} {Document social determinants of health affecting pt's care:1} {Document your decision making why or why not admission, treatments were needed:1} Final Clinical Impression(s) / ED Diagnoses Final diagnoses:  None    Rx / DC Orders ED Discharge Orders     None

## 2022-05-07 ENCOUNTER — Inpatient Hospital Stay (HOSPITAL_COMMUNITY): Payer: HMO

## 2022-05-07 DIAGNOSIS — I5033 Acute on chronic diastolic (congestive) heart failure: Secondary | ICD-10-CM

## 2022-05-07 DIAGNOSIS — J9601 Acute respiratory failure with hypoxia: Secondary | ICD-10-CM | POA: Diagnosis not present

## 2022-05-07 DIAGNOSIS — J9621 Acute and chronic respiratory failure with hypoxia: Secondary | ICD-10-CM | POA: Diagnosis not present

## 2022-05-07 DIAGNOSIS — J9622 Acute and chronic respiratory failure with hypercapnia: Secondary | ICD-10-CM

## 2022-05-07 DIAGNOSIS — J9602 Acute respiratory failure with hypercapnia: Secondary | ICD-10-CM | POA: Diagnosis not present

## 2022-05-07 LAB — CBC
HCT: 45.2 % (ref 39.0–52.0)
Hemoglobin: 13 g/dL (ref 13.0–17.0)
MCH: 26.1 pg (ref 26.0–34.0)
MCHC: 28.8 g/dL — ABNORMAL LOW (ref 30.0–36.0)
MCV: 90.6 fL (ref 80.0–100.0)
Platelets: 163 10*3/uL (ref 150–400)
RBC: 4.99 MIL/uL (ref 4.22–5.81)
RDW: 15.7 % — ABNORMAL HIGH (ref 11.5–15.5)
WBC: 4.9 10*3/uL (ref 4.0–10.5)
nRBC: 0 % (ref 0.0–0.2)

## 2022-05-07 LAB — ECHOCARDIOGRAM COMPLETE
AR max vel: 2.23 cm2
AV Area VTI: 2.22 cm2
AV Area mean vel: 1.95 cm2
AV Mean grad: 3 mmHg
AV Peak grad: 5.5 mmHg
Ao pk vel: 1.17 m/s
Area-P 1/2: 2.95 cm2
Height: 70 in
MV VTI: 2.37 cm2
S' Lateral: 2.5 cm
Weight: 3072.33 oz

## 2022-05-07 LAB — GLUCOSE, CAPILLARY
Glucose-Capillary: 117 mg/dL — ABNORMAL HIGH (ref 70–99)
Glucose-Capillary: 97 mg/dL (ref 70–99)
Glucose-Capillary: 87 mg/dL (ref 70–99)
Glucose-Capillary: 109 mg/dL — ABNORMAL HIGH (ref 70–99)
Glucose-Capillary: 160 mg/dL — ABNORMAL HIGH (ref 70–99)
Glucose-Capillary: 72 mg/dL (ref 70–99)

## 2022-05-07 LAB — BLOOD GAS, ARTERIAL
Acid-Base Excess: 14.3 mmol/L — ABNORMAL HIGH (ref 0.0–2.0)
Bicarbonate: 44.3 mmol/L — ABNORMAL HIGH (ref 20.0–28.0)
Drawn by: 38235
FIO2: 35 %
O2 Saturation: 93.9 %
Patient temperature: 37
pCO2 arterial: 84 mmHg (ref 32–48)
pH, Arterial: 7.33 — ABNORMAL LOW (ref 7.35–7.45)
pO2, Arterial: 63 mmHg — ABNORMAL LOW (ref 83–108)

## 2022-05-07 LAB — COMPREHENSIVE METABOLIC PANEL
ALT: 21 U/L (ref 0–44)
AST: 14 U/L — ABNORMAL LOW (ref 15–41)
Albumin: 3.3 g/dL — ABNORMAL LOW (ref 3.5–5.0)
Alkaline Phosphatase: 49 U/L (ref 38–126)
Anion gap: 12 (ref 5–15)
BUN: 34 mg/dL — ABNORMAL HIGH (ref 8–23)
CO2: 32 mmol/L (ref 22–32)
Calcium: 8.8 mg/dL — ABNORMAL LOW (ref 8.9–10.3)
Chloride: 101 mmol/L (ref 98–111)
Creatinine, Ser: 1.11 mg/dL (ref 0.61–1.24)
GFR, Estimated: >60 mL/min (ref >60)
Glucose, Bld: 116 mg/dL — ABNORMAL HIGH (ref 70–99)
Potassium: 4 mmol/L (ref 3.5–5.1)
Sodium: 145 mmol/L (ref 135–145)
Total Bilirubin: 0.8 mg/dL (ref 0.3–1.2)
Total Protein: 6.7 g/dL (ref 6.5–8.1)

## 2022-05-07 MED ORDER — CHLORHEXIDINE GLUCONATE CLOTH 2 % EX PADS
6.0000 | MEDICATED_PAD | Freq: Every day | CUTANEOUS | Status: DC
  Administered 2022-05-07 – 2022-05-11 (×5): 6 via TOPICAL

## 2022-05-07 MED ORDER — PERFLUTREN LIPID MICROSPHERE
1.0000 mL | INTRAVENOUS | Status: AC | PRN
  Administered 2022-05-07: 6 mL via INTRAVENOUS

## 2022-05-07 MED ORDER — CHLORHEXIDINE GLUCONATE 0.12 % MT SOLN
15.0000 mL | Freq: Two times a day (BID) | OROMUCOSAL | Status: DC
  Administered 2022-05-07 – 2022-05-11 (×9): 15 mL via OROMUCOSAL
  Filled 2022-05-07 (×9): qty 15

## 2022-05-07 MED ORDER — ORAL CARE MOUTH RINSE
15.0000 mL | Freq: Two times a day (BID) | OROMUCOSAL | Status: DC
  Administered 2022-05-07 – 2022-05-11 (×8): 15 mL via OROMUCOSAL

## 2022-05-07 NOTE — Progress Notes (Signed)
PROGRESS NOTE    Tim Day  DJS:[id number] DOB: [birth year] DOA: 05/06/2022 PCP: Celene Squibb, MD   Brief Narrative:    Tim Day is a 71 y.o. male with medical history significant of chronic hypoxic respiratory failure on 2-3 L of oxygen at home, chronic HFpEF, DM-2, HTN, HLD presented to the hospital from home with shortness of breath x2 days.  Patient was admitted with acute on chronic hypoxemic and hypercarbic respiratory failure in the setting of bilateral lower lobe pneumonia with suspicion of aspiration along with acute on chronic diastolic CHF exacerbation.  Tim Day has been started on IV antibiotics and IV Lasix.  Pulmonology consulted for assistance in management given BiPAP use and pulmonary fibrosis.  Assessment & Plan:   Principal Problem:   Respiratory failure with hypoxia and hypercapnia (HCC) Active Problems:   Acute on chronic respiratory failure with hypoxia (HCC)  Assessment and Plan:   Acute on chronic hypoxic and hypercarbic respiratory failure (2-3 L of oxygen at baseline): Appears to be multifactorial-from decompensated diastolic heart failure, possible aspiration pneumonia.  Appreciate pulmonology evaluation and patient has been weaned off BiPAP to 3 L nasal cannula at this time.  Defer further ABG for now.   Acute on chronic HFpEF: Appears volume overloaded-3+ pitting edema-continue IV Lasix-keep in negative balance.  Tim Day appears to be diuresing well with over 2 L of negative output overnight.  Follow volume status, electrolytes closely and adjust diuretic regimen accordingly.  2D echocardiogram with LVEF 55-60% and no other acute abnormalities.   Possible aspiration pneumonia: We will continue Unasyn-and continue Zithromax x3 days for atypical coverage.  Currently day 2 of antibiotics.  SLP evaluation when able.   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  Per  history-patient apparently was hospitalized for 17 days with COVID-19-and has required oxygen since then.  Appreciate pulmonology evaluation.   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  VTE was related to COVID-19 infection.  CTA chest negative-will be on prophylactic Lovenox.  Ultrasound performed 6/5 positive for right lower extremity DVT.  Can transition to oral agents once SLP evaluation completed.   DM-2: Hold all oral hypoglycemics-and monitor CBGs on SSI and 10 units of Levemir.   HTN: BP currently appears stable-we will be n.p.o. as Tim Day is on BiPAP-we will use as needed hydralazine and labetalol.   Depression/anxiety: Hold home meds-resume when Tim Day is more stable and after SLP evaluation completed.   History of lung nodule: Continue outpatient follow-up with Dr. Melvyn Novas    DVT prophylaxis: Full dose Lovenox Code Status: Full Family Communication: Wife at bedside 6/6 Disposition Plan:  Status is: Inpatient Remains inpatient appropriate because: Need for IV medications.  Consultants:  Pulmonology  Procedures:  2D echocardiogram 6/6  Antimicrobials:  Anti-infectives (From admission, onward)    Start     Dose/Rate Route Frequency Ordered Stop   05/07/22 1345  azithromycin (ZITHROMAX) 500 mg in sodium chloride 0.9 % 250 mL IVPB        500 mg 250 mL/hr over 60 Minutes Intravenous Every 24 hours 05/06/22 1553 05/10/22 1344   05/06/22 2000  Ampicillin-Sulbactam (UNASYN) 3 g in sodium chloride 0.9 % 100 mL IVPB        3 g 200 mL/hr over 30 Minutes Intravenous Every 6 hours 05/06/22 1559     05/06/22 1700  cefTRIAXone (ROCEPHIN) 1 g in sodium  chloride 0.9 % 100 mL IVPB  Status:  Discontinued        1 g 200 mL/hr over 30 Minutes Intravenous Every 24 hours 05/06/22 1445 05/06/22 1553   05/06/22 1500  azithromycin (ZITHROMAX) 500 mg in sodium chloride 0.9 % 250 mL IVPB  Status:  Discontinued         500 mg 250 mL/hr over 60 Minutes Intravenous Every 24 hours 05/06/22 1445 05/06/22 1449   05/06/22 1345  Ampicillin-Sulbactam (UNASYN) 3 g in sodium chloride 0.9 % 100 mL IVPB        3 g 200 mL/hr over 30 Minutes Intravenous  Once 05/06/22 1342 05/06/22 1451   05/06/22 1345  azithromycin (ZITHROMAX) 500 mg in sodium chloride 0.9 % 250 mL IVPB  Status:  Discontinued        500 mg 250 mL/hr over 60 Minutes Intravenous Every 24 hours 05/06/22 1343 05/06/22 1553       Subjective: Patient seen and evaluated today while on BiPAP.  Tim Day would like to have some sips of water and states his mouth is dry.  Tim Day denies any other pain complaints.  Wife is at bedside.  Objective: Vitals:   05/07/22 1436 05/07/22 1445 05/07/22 1450 05/07/22 1500  BP:   103/64 112/68  Pulse: 88 81 81 82  Resp:   (!) 22   Temp:      TempSrc:      SpO2: (!) 82% 95% 95% 95%  Weight:      Height:        Intake/Output Summary (Last 24 hours) at 05/07/2022 1515 Last data filed at 05/07/2022 1503 Gross per 24 hour  Intake 1133 ml  Output 3700 ml  Net -2567 ml   Filed Weights   05/06/22 1026 05/07/22 0346  Weight: 90.7 kg 87.1 kg    Examination:  General exam: Appears calm and comfortable  Respiratory system: Diminished to auscultation bilaterally.  Currently on BiPAP 18/10 FiO2 40%. Cardiovascular system: S1 & S2 heard, RRR.  Gastrointestinal system: Abdomen is soft Central nervous system: Alert and awake Extremities: Bilateral peripheral edema noted. Skin: No significant lesions noted Psychiatry: Flat affect.    Data Reviewed: I have personally reviewed following labs and imaging studies  CBC: Recent Labs  Lab 05/06/22 1032 05/07/22 0333  WBC 5.6 4.9  NEUTROABS 4.5  --   HGB 13.8 13.0  HCT 47.8 45.2  MCV 90.5 90.6  PLT 149* 270   Basic Metabolic Panel: Recent Labs  Lab 05/06/22 1154 05/07/22 0333  NA 137 145  K 4.3 4.0  CL 100 101  CO2 30 32  GLUCOSE 155* 116*  BUN 33* 34*  CREATININE  0.89 1.11  CALCIUM 8.7* 8.8*  MG 2.0  --    GFR: Estimated Creatinine Clearance: 65.8 mL/min (by C-G formula based on SCr of 1.11 mg/dL). Liver Function Tests: Recent Labs  Lab 05/06/22 1154 05/07/22 0333  AST 19 14*  ALT 23 21  ALKPHOS 49 49  BILITOT 0.8 0.8  PROT 6.8 6.7  ALBUMIN 3.5 3.3*   No results for input(s): LIPASE, AMYLASE in the last 168 hours. No results for input(s): AMMONIA in the last 168 hours. Coagulation Profile: Recent Labs  Lab 05/06/22 2148  INR 1.3*   Cardiac Enzymes: No results for input(s): CKTOTAL, CKMB, CKMBINDEX, TROPONINI in the last 168 hours. BNP (last 3 results) No results for input(s): PROBNP in the last 8760 hours. HbA1C: No results for input(s): HGBA1C in the last 72 hours. CBG:  Recent Labs  Lab 05/06/22 1930 05/06/22 2308 05/07/22 0331 05/07/22 0733 05/07/22 1120  GLUCAP 171* 154* 117* 97 87   Lipid Profile: No results for input(s): CHOL, HDL, LDLCALC, TRIG, CHOLHDL, LDLDIRECT in the last 72 hours. Thyroid Function Tests: No results for input(s): TSH, T4TOTAL, FREET4, T3FREE, THYROIDAB in the last 72 hours. Anemia Panel: No results for input(s): VITAMINB12, FOLATE, FERRITIN, TIBC, IRON, RETICCTPCT in the last 72 hours. Sepsis Labs: Recent Labs  Lab 05/06/22 1154  PROCALCITON <0.10    Recent Results (from the past 240 hour(s))  Resp Panel by RT-PCR (Flu A&B, Covid) Anterior Nasal Swab     Status: None   Collection Time: 05/06/22 10:32 AM   Specimen: Anterior Nasal Swab  Result Value Ref Range Status   SARS Coronavirus 2 by RT PCR NEGATIVE NEGATIVE Final    Comment: (NOTE) SARS-CoV-2 target nucleic acids are NOT DETECTED.  The SARS-CoV-2 RNA is generally detectable in upper respiratory specimens during the acute phase of infection. The lowest concentration of SARS-CoV-2 viral copies this assay can detect is 138 copies/mL. A negative result does not preclude SARS-Cov-2 infection and should not be used as the sole  basis for treatment or other patient management decisions. A negative result may occur with  improper specimen collection/handling, submission of specimen other than nasopharyngeal swab, presence of viral mutation(s) within the areas targeted by this assay, and inadequate number of viral copies(<138 copies/mL). A negative result must be combined with clinical observations, patient history, and epidemiological information. The expected result is Negative.  Fact Sheet for Patients:  EntrepreneurPulse.com.au  Fact Sheet for Healthcare Providers:  IncredibleEmployment.be  This test is no t yet approved or cleared by the [place] FDA and  has been authorized for detection and/or diagnosis of SARS-CoV-2 by FDA under an Emergency Use Authorization (EUA). This EUA will remain  in effect (meaning this test can be used) for the duration of the COVID-19 declaration under Section 564(b)(1) of the Act, 21 U.S.C.section 360bbb-3(b)(1), unless the authorization is terminated  or revoked sooner.       Influenza A by PCR NEGATIVE NEGATIVE Final   Influenza B by PCR NEGATIVE NEGATIVE Final    Comment: (NOTE) The Xpert Xpress SARS-CoV-2/FLU/RSV plus assay is intended as an aid in the diagnosis of influenza from Nasopharyngeal swab specimens and should not be used as a sole basis for treatment. Nasal washings and aspirates are unacceptable for Xpert Xpress SARS-CoV-2/FLU/RSV testing.  Fact Sheet for Patients: EntrepreneurPulse.com.au  Fact Sheet for Healthcare Providers: IncredibleEmployment.be  This test is not yet approved or cleared by the [place] FDA and has been authorized for detection and/or diagnosis of SARS-CoV-2 by FDA under an Emergency Use Authorization (EUA). This EUA will remain in effect (meaning this test can be used) for the duration of the COVID-19 declaration under Section 564(b)(1) of the Act,  21 U.S.C. section 360bbb-3(b)(1), unless the authorization is terminated or revoked.  Performed at [facility], [address] [place], [place] [address]   MRSA Next Gen by PCR, Nasal     Status: Abnormal   Collection Time: 05/06/22  3:31 PM   Specimen: Nasal Mucosa; Nasal Swab  Result Value Ref Range Status   MRSA by PCR Next Gen DETECTED (A) NOT DETECTED Final    Comment: RCRV K EDWARDS AT 1720 ON 05.05.23 BY ADGER J         The GeneXpert MRSA Assay (FDA approved for NASAL specimens only), is one component of a comprehensive MRSA  colonization surveillance program. It is not intended to diagnose MRSA infection nor to guide or monitor treatment for MRSA infections. Performed at [facility], [address] [place], [place] [address]          Radiology Studies: CT Angio Chest PE W and/or Wo Contrast  Result Date: 05/06/2022 CLINICAL DATA:  Pulmonary embolism (PE) suspected, positive D-dimer EXAM: CT ANGIOGRAPHY CHEST WITH CONTRAST TECHNIQUE: Multidetector CT imaging of the chest was performed using the standard protocol during bolus administration of intravenous contrast. Multiplanar CT image reconstructions and MIPs were obtained to evaluate the vascular anatomy. RADIATION DOSE REDUCTION: This exam was performed according to the departmental dose-optimization program which includes automated exposure control, adjustment of the mA and/or kV according to patient size and/or use of iterative reconstruction technique. CONTRAST:  86m OMNIPAQUE IOHEXOL 350 MG/ML SOLN COMPARISON:  High-resolution chest CT from Apr 22, 2022. FINDINGS: Cardiovascular: Satisfactory opacification of the pulmonary arteries to the segmental level. No evidence of pulmonary embolism. Normal heart size. No pericardial effusion. Coronary artery atherosclerosis. Aortic valve calcifications. Mediastinum/Nodes: Similar prominent mediastinal lymph nodes. Normal appearance of the trachea, esophagus and visualized  thyroid. Lungs/Pleura: Low lung volumes. Redemonstrated irregular nodularity in the left lung, greatest in the left upper lobe and measuring up to 1.5 cm. Redemonstrated pulmonary fibrosis, better characterized on the prior. Increased bibasilar consolidation. No pleural effusions. No pneumothorax. Upper Abdomen: No acute abnormality. Musculoskeletal: No chest wall abnormality. No acute or significant osseous findings. Review of the MIP images confirms the above findings. IMPRESSION: 1. No evidence of acute pulmonary embolism. 2. Increased bibasilar consolidation which could represent aspiration and/or pneumonia. 3. Pulmonary nodules and pulmonary fibrosis better characterized on recent high-resolution chest CT from Apr 22, 2022. 4. Aortic valve calcifications and coronary artery disease. Electronically Signed   By: FMargaretha SheffieldM.D.   On: 05/06/2022 13:05   UK[place]Venous Img Lower Bilateral (DVT)  Result Date: 05/06/2022 CLINICAL DATA:  Bilateral lower extremity swelling. EXAM: BILATERAL LOWER EXTREMITY VENOUS DOPPLER ULTRASOUND TECHNIQUE: Gray-scale sonography with compression, as well as color and duplex ultrasound, were performed to evaluate the deep venous system(s) from the level of the common femoral vein through the popliteal and proximal calf veins. COMPARISON:  None Available. FINDINGS: VENOUS The right profunda femoris vein is partially compressible with flow consistent with nonocclusive DVT. The remaining right lower extremity veins are patent with normal flow. Doppler waveform shows normal direction of the venous flow. There is normal compressibility of the left common femoral, superficial femoral, and popliteal veins, as well as the visualized calf veins. Visualized portions of profunda femoral vein and great saphenous vein unremarkable. No filling defects to suggest DVT on grayscale or color Doppler imaging. Doppler waveforms show normal direction of venous flow, normal respiratory plasticity and  response to augmentation. OTHER There is marked subcutaneous soft tissue edema in bilateral calves. Limitations: none IMPRESSION: 1.  Nonocclusive DVT of the right profunda femoris vein. 2. The remaining right lower extremity and left lower extremity veins are patent. Electronically Signed   By: IKeane PoliceD.O.   On: 05/06/2022 19:31   DG CHEST PORT 1 VIEW  Result Date: 05/07/2022 CLINICAL DATA:  Choking episode while drinking water, developed shortness of breath than oxygen desaturation EXAM: PORTABLE CHEST 1 VIEW COMPARISON:  Portable exam 1453 hours compared to 05/06/2022 FINDINGS: Enlargement of cardiac silhouette. Mediastinal contours and pulmonary vascularity normal. Chronic accentuation of interstitial markings. Decreased bibasilar atelectasis. No definite new infiltrate, pleural effusion, or pneumothorax. Calcified granuloma LEFT  upper lobe. IMPRESSION: Improved aeration since prior study. Electronically Signed   By: Lavonia Dana M.D.   On: 05/07/2022 15:04   DG Chest Port 1 View  Result Date: 05/06/2022 CLINICAL DATA:  shortness of breath EXAM: PORTABLE CHEST 1 VIEW COMPARISON:  Chest radiograph dated May 04, 2022 FINDINGS: The cardiomediastinal silhouette is unchanged in contour.Unchanged LEFT apical pulmonary nodule. No pleural effusion. No pneumothorax. Similar appearance of diffuse coarse reticulations throughout bilateral lungs. There is a slightly more confluent appearance at the LEFT lung base in comparison to prior. Visualized abdomen is unremarkable. IMPRESSION: Slightly increased airspace opacities at the LEFT lung base in comparison to prior. Findings may reflect superimposed atelectasis, aspiration or infection on a background of pulmonary fibrosis. Electronically Signed   By: Valentino Saxon M.D.   On: 05/06/2022 10:51   ECHOCARDIOGRAM COMPLETE  Result Date: 05/07/2022    ECHOCARDIOGRAM REPORT   Patient Name:   Tim Day Date of Exam: 05/07/2022 Medical Rec #:  [id number]             Height:       70.0 in Accession #:    [id number]           Weight:       192.0 lb Date of Birth:  1951-07-31            BSA:          2.052 m Patient Age:    34 years             BP:           121/71 mmHg Patient Gender: M                    HR:           85 bpm. Exam Location:  [facility] Procedure: 2D Echo, Cardiac Doppler, Color Doppler and Intracardiac            Opacification Agent Indications:    CHF  History:        Patient has prior history of Echocardiogram examinations, most                 recent 04/11/2021. Abnormal ECG, Signs/Symptoms:Dyspnea; Risk                 Factors:Hypertension, Diabetes and Dyslipidemia.  Sonographer:    Wenda Low Referring Phys: Herrick  1. Left ventricular ejection fraction, by estimation, is 55 to 60%. The left ventricle has normal function. The left ventricle has no regional wall motion abnormalities. Left ventricular diastolic parameters were normal.  2. Right ventricular systolic function is moderately reduced. The right ventricular size is moderately enlarged. There is severely elevated pulmonary artery systolic pressure.  3. Right atrial size was mildly dilated.  4. The mitral valve is normal in structure. No evidence of mitral valve regurgitation. No evidence of mitral stenosis.  5. The aortic valve is tricuspid. There is mild calcification of the aortic valve. There is mild thickening of the aortic valve. Aortic valve regurgitation is not visualized. Aortic valve sclerosis is present, with no evidence of aortic valve stenosis.  6. Aortic dilatation noted. There is mild dilatation of the ascending aorta, measuring 38 mm.  7. The inferior vena cava is normal in size with greater than 50% respiratory variability, suggesting right atrial pressure of 3 mmHg. FINDINGS  Left Ventricle: Left ventricular ejection fraction, by estimation, is 55 to 60%. The left ventricle has  normal function. The left ventricle has no regional wall motion  abnormalities. Definity contrast agent was given IV to delineate the left ventricular  endocardial borders. The left ventricular internal cavity size was normal in size. There is no left ventricular hypertrophy. Left ventricular diastolic parameters were normal. Right Ventricle: The right ventricular size is moderately enlarged. No increase in right ventricular wall thickness. Right ventricular systolic function is moderately reduced. There is severely elevated pulmonary artery systolic pressure. The tricuspid regurgitant velocity is 3.72 m/s, and with an assumed right atrial pressure of 15 mmHg, the estimated right ventricular systolic pressure is 27.0 mmHg. Left Atrium: Left atrial size was normal in size. Right Atrium: Right atrial size was mildly dilated. Pericardium: There is no evidence of pericardial effusion. Mitral Valve: The mitral valve is normal in structure. No evidence of mitral valve regurgitation. No evidence of mitral valve stenosis. MV peak gradient, 2.1 mmHg. The mean mitral valve gradient is 1.0 mmHg. Tricuspid Valve: The tricuspid valve is normal in structure. Tricuspid valve regurgitation is not demonstrated. No evidence of tricuspid stenosis. Aortic Valve: The aortic valve is tricuspid. There is mild calcification of the aortic valve. There is mild thickening of the aortic valve. Aortic valve regurgitation is not visualized. Aortic valve sclerosis is present, with no evidence of aortic valve stenosis. Aortic valve mean gradient measures 3.0 mmHg. Aortic valve peak gradient measures 5.5 mmHg. Aortic valve area, by VTI measures 2.22 cm. Pulmonic Valve: The pulmonic valve was normal in structure. Pulmonic valve regurgitation is trivial. No evidence of pulmonic stenosis. Aorta: Aortic dilatation noted. There is mild dilatation of the ascending aorta, measuring 38 mm. Venous: The inferior vena cava is normal in size with greater than 50% respiratory variability, suggesting right atrial pressure of  3 mmHg. IAS/Shunts: No atrial level shunt detected by color flow Doppler.  LEFT VENTRICLE PLAX 2D LVIDd:         4.60 cm   Diastology LVIDs:         2.50 cm   LV e' medial:    8.27 cm/s LV PW:         0.80 cm   LV E/e' medial:  6.7 LV IVS:        0.80 cm   LV e' lateral:   13.10 cm/s LVOT diam:     2.00 cm   LV E/e' lateral: 4.2 LV SV:         47 LV SV Index:   23 LVOT Area:     3.14 cm  RIGHT VENTRICLE RV Basal diam:  3.90 cm RV Mid diam:    3.90 cm RV S prime:     10.90 cm/s TAPSE (M-mode): 2.1 cm LEFT ATRIUM             Index        RIGHT ATRIUM           Index LA diam:        3.30 cm 1.61 cm/m   RA Area:     21.70 cm LA Vol (A2C):   61.8 ml 30.12 ml/m  RA Volume:   59.30 ml  28.91 ml/m LA Vol (A4C):   48.0 ml 23.40 ml/m LA Biplane Vol: 59.4 ml 28.95 ml/m  AORTIC VALVE                    PULMONIC VALVE AV Area (Vmax):    2.23 cm     PV Vmax:       0.63  m/s AV Area (Vmean):   1.95 cm     PV Peak grad:  1.6 mmHg AV Area (VTI):     2.22 cm AV Vmax:           117.00 cm/s AV Vmean:          80.400 cm/s AV VTI:            0.214 m AV Peak Grad:      5.5 mmHg AV Mean Grad:      3.0 mmHg LVOT Vmax:         83.00 cm/s LVOT Vmean:        50.000 cm/s LVOT VTI:          0.151 m LVOT/AV VTI ratio: 0.71  AORTA Ao Root diam: 3.80 cm Ao Asc diam:  4.10 cm MITRAL VALVE               TRICUSPID VALVE MV Area (PHT): 2.95 cm    TR Peak grad:   55.4 mmHg MV Area VTI:   2.37 cm    TR Vmax:        372.00 cm/s MV Peak grad:  2.1 mmHg MV Mean grad:  1.0 mmHg    SHUNTS MV Vmax:       0.73 m/s    Systemic VTI:  0.15 m MV Vmean:      46.6 cm/s   Systemic Diam: 2.00 cm MV Decel Time: 257 msec MV E velocity: 55.50 cm/s MV A velocity: 77.10 cm/s MV E/A ratio:  0.72 Jenkins Rouge MD Electronically signed by Jenkins Rouge MD Signature Date/Time: 05/07/2022/12:25:42 PM    Final         Scheduled Meds:  chlorhexidine  15 mL Mouth Rinse BID   Chlorhexidine Gluconate Cloth  6 each Topical Q0600   enoxaparin (LOVENOX) injection  90 mg  Subcutaneous Q12H   furosemide  40 mg Intravenous Q12H   insulin aspart  0-9 Units Subcutaneous Q4H   insulin glargine-yfgn  10 Units Subcutaneous q AM   ipratropium-albuterol  3 mL Nebulization Q6H   mouth rinse  15 mL Mouth Rinse q12n4p   mupirocin ointment  1 application. Nasal BID   sodium chloride flush  3 mL Intravenous Q12H   Continuous Infusions:  sodium chloride     ampicillin-sulbactam (UNASYN) IV 200 mL/hr at 05/07/22 1503   azithromycin Stopped (05/07/22 1344)     LOS: 1 day    Time spent: 35 minutes    Teruko Joswick Darleen Crocker, DO [facility]  If 7PM-7AM, please contact night-coverage [email or web address] 05/07/2022, 3:15 PM

## 2022-05-07 NOTE — Progress Notes (Signed)
  Transition of Care [facility]) Screening Note   Patient Details  Name: Tim Day Date of Birth: 1951/09/23   Transition of Care [facility]) CM/SW Contact:    Ihor Gully, LCSW Phone Number: 05/07/2022, 4:31 PM    Transition of Care Department [facility]) has reviewed patient and no TOC needs have been identified at this time. We will continue to monitor patient advancement through interdisciplinary progression rounds. If new patient transition needs arise, please place a TOC consult.

## 2022-05-07 NOTE — Progress Notes (Signed)
Pt passed his [facility] swallow screen earlier by attending RN while on his 3L of oxygen. Patient was drinking water and had a choking episode and became very SOB and O2 sat's dropped as low as 76%. Pt placed back on Bi-PAP, FiO2 35%.   MD and RT made aware, STAT CXR ordered to check for any aspiration, and SLP contacted to see if they are able to see patient this afternoon as he is able to be on regular oxygen as he wasn't able to come off Bi-PAP this morning. Will continue to monitor.

## 2022-05-07 NOTE — Progress Notes (Signed)
PT Cancellation Note  Patient Details Name: JAHLEN BOLLMAN MRN: [id number] DOB: 05/08/1951   Cancelled Treatment:    Reason Eval/Treat Not Completed: Medical issues which prohibited therapy.  Patient held per RN request due to having difficulty breathing on BiPAP.   11:50 AM, 05/07/22 Lonell Grandchild, MPT Physical Therapist with [facility] [phone] office [phone] mobile phone

## 2022-05-07 NOTE — Consult Note (Signed)
[facility]   Patient name: Tim Day Admit date: 05/06/2022  DOB: 12/10/1950 LOS: 1  MRN: [id number] Consult date: 05/07/2022  Referring provider: Dr. Manuella Ghazi, [facility] CC: Dyspnea    History:  71 yo male presented to [facility] with 2 days of worsening dyspnea, cough and leg swelling.  He has hx of COVID with pulmonary fibrosis and uses 2 to 3 liters oxygen, and is followed by Dr. Melvyn Novas.  In ER his SpO2 was in 70's and he required Bipap therapy.  ABG showed acute on chronic hypoxia/hypercapnia.  PCCM asked to assist with respiratory management.  Past medical history:  Diastolic CHF, DM type 2, HLD, HTN, Chronic pain, GERD, Melanoma, Insomnia, DVT, COVID pneumonia September 2021, Lt pneumothorax September 2021  Significant events:  6/05 Admit  Studies:  PFT 03/07/21 >> FEV1 1.32 (39%), FEV1% 92, TLC 2.13 (30%), DLCO 43% CT angio chest 05/06/22 >> 1.5 cm irregular nodule LUL, pulmonary fibrosis, basilar consolidation Doppler b/l legs 05/06/22 >> nonocclusive DVT Rt profunda femoris vein Echo 05/07/22 >> EF 55 to 60%, mod RV dysfx and enlargement, severe elevation in PASP, 38 mm ascending aorta  Micro:  COVID/Flu 6/05 >> negative  Lines:     Antibiotics:  Unasyn 6/05 >> Zithromax 6/05 >>   Consults:      Interim history:  He uses a POC at home.  His SpO2 drops with activity at home.  He has trouble breathing when laying flat and sleeps in a recliner.  He gets short of breath with minimal exertion and then feels anxious.    Vital signs:  BP 116/71   Pulse 86   Temp 97.6 F (36.4 C) (Axillary)   Resp (!) 28   Ht '5\' 10"'  (1.778 m)   Wt 87.1 kg   SpO2 97%   BMI 27.55 kg/m   Intake/output:  I/O last 3 completed shifts: In: 3 [I.V.:3] Out: 2300 [Urine:2300]   Physical exam:   General - alert Eyes - pupils reactive ENT - no sinus tenderness, no stridor Cardiac - regular rate/rhythm, no murmur Chest - decreased BS,  faint b/l crackles Abdomen - soft, non tender, + bowel sounds Extremities - 1+ edema of Rt leg Skin - no rashes Neuro - normal strength, moves extremities, follows commands Psych - normal mood and behavior  Best practice:   DVT - Lovenox SUP - N/A Nutrition - carb mod/heart healthy   Assessment/plan:   Acute on chronic hypoxic/hypercapnic respiratory failure. - from bilateral lower lobe pneumonia in setting of post-COVID pulmonary fibrosis - goal SpO2 > 90% - Bipap qhs and prn; might need assessment for NIV prior to discharge - day 2 of ABx - defer additional ABGs for now; can monitor mental status and follow up HCO3 on BMET  Recurrent DVT. - currently on lovenox - defer transition to oral anticoagulation to primary team - suspect he should stay on anticoagulation therapy indefinitely  Pulmonary hypertension. - suspect he needs higher supplemental oxygen settings as outpt - he will need repeat Echo in few weeks after this hospitalization; if PA pressures still elevated, then might need RHC  Acute on chronic diastolic CHF. - continue lasix  DM type 2. Depression/anxiety. - per primary team  Resolved hospital problems:    Goals of care/Family discussions:  Code status: full code  Updated his wife and brother at bedside.  Labs:      Latest Ref Rng & Units 05/07/2022    3:33 AM 05/06/2022   11:54  AM 04/08/2022   11:19 AM  CMP  Glucose 70 - 99 mg/dL 116   155   128    BUN 8 - 23 mg/dL 34   33   17    Creatinine 0.61 - 1.24 mg/dL 1.11   0.89   0.98    Sodium 135 - 145 mmol/L 145   137   141    Potassium 3.5 - 5.1 mmol/L 4.0   4.3   5.1    Chloride 98 - 111 mmol/L 101   100   98    CO2 22 - 32 mmol/L 32   30   28    Calcium 8.9 - 10.3 mg/dL 8.8   8.7   9.8    Total Protein 6.5 - 8.1 g/dL 6.7   6.8     Total Bilirubin 0.3 - 1.2 mg/dL 0.8   0.8     Alkaline Phos 38 - 126 U/L 49   49     AST 15 - 41 U/L 14   19     ALT 0 - 44 U/L 21   23          Latest Ref Rng &  Units 05/07/2022    3:33 AM 05/06/2022   10:32 AM 04/08/2022   11:19 AM  CBC  WBC 4.0 - 10.5 K/uL 4.9   5.6   7.0    Hemoglobin 13.0 - 17.0 g/dL 13.0   13.8   14.2    Hematocrit 39.0 - 52.0 % 45.2   47.8   45.7    Platelets 150 - 400 K/uL 163   149   179      ABG    Component Value Date/Time   PHART 7.33 (L) 05/07/2022 0239   PCO2ART 84 (HH) 05/07/2022 0239   PO2ART 63 (L) 05/07/2022 0239   HCO3 44.3 (H) 05/07/2022 0239   TCO2 25 11/17/2021 0431   ACIDBASEDEF 1.1 04/09/2021 2012   O2SAT 93.9 05/07/2022 0239    CBG (last 3)  Recent Labs    05/07/22 0331 05/07/22 0733 05/07/22 1120  GLUCAP 117* 97 87     Past surgical history:  He  has a past surgical history that includes Shoulder surgery (2008); Ankle fracture surgery; Abdominal exploration surgery; Colonoscopy (07/13/2012); left heart catheterization with coronary angiogram (N/A, 01/20/2015); IR Perc Cholecystostomy (04/10/2021); IR Radiologist Eval & Mgmt (05/08/2021); IR EXCHANGE BILIARY DRAIN (05/16/2021); IR EXCHANGE BILIARY DRAIN (05/24/2021); IR REMOVAL OF CALCULI/DEBRIS BILIARY DUCT/GB (05/28/2021); IR EXCHANGE BILIARY DRAIN (05/28/2021); IR REMOVAL OF CALCULI/DEBRIS BILIARY DUCT/GB (06/25/2021); IR EXCHANGE BILIARY DRAIN (06/28/2021); IR Radiologist Eval & Mgmt (07/05/2021); IR Radiologist Eval & Mgmt (07/19/2021); IR CHOLANGIOGRAM EXISTING TUBE (08/30/2021); and IR Sinus/Fist Tube Chk-Non GI (08/20/2021).  Social history:  He  reports that he has never smoked. He has never used smokeless tobacco. He reports that he does not drink alcohol and does not use drugs.   Review of systems:  Reviewed and negative  Family history:  His family history includes CAD in his father; Diabetes in his mother.    Medications:   No current facility-administered medications on file prior to encounter.   Current Outpatient Medications on File Prior to Encounter  Medication Sig   acetaminophen (TYLENOL) 500 MG tablet Take 1,000 mg by mouth 2 (two)  times daily as needed for moderate pain.   alclomethasone (ACLOVATE) 0.05 % cream Apply topically 2 (two) times daily as needed (Rash).   ALPRAZolam (XANAX) 0.25 MG tablet Take 0.25  mg by mouth daily as needed for anxiety.   ascorbic acid (VITAMIN C) 500 MG tablet Take 1 tablet (500 mg total) by mouth daily.   aspirin EC 81 MG tablet Take 81 mg by mouth daily. Swallow whole.   buPROPion (WELLBUTRIN XL) 150 MG 24 hr tablet Take 150 mg by mouth daily.   Cholecalciferol (VITAMIN D3) 125 MCG (5000 UT) CAPS Take 5,000 Units by mouth daily.   co-enzyme Q-10 50 MG capsule Take 50 mg by mouth 2 (two) times daily.   doxycycline (VIBRAMYCIN) 100 MG capsule Take 1 capsule by mouth in the morning and at bedtime.   Empagliflozin (JARDIANCE PO) Take 25 mg by mouth.   famotidine (PEPCID) 20 MG tablet TAKE ONE TABLET BY MOUTH DAILY AFTER SUPPER (Patient taking differently: Take 20 mg by mouth at bedtime.)   FIASP FLEXTOUCH 100 UNIT/ML FlexTouch Pen Inject 2-8 Units into the skin every evening.   furosemide (LASIX) 20 MG tablet Take 20 mg by mouth daily as needed for edema.   ketoconazole (NIZORAL) 2 % shampoo Apply topically.   losartan (COZAAR) 25 MG tablet Take 12.5 mg by mouth daily.   metFORMIN (GLUCOPHAGE) 1000 MG tablet Take 1,000 mg by mouth 2 (two) times daily with a meal.   Multiple Vitamin (MULTIVITAMIN) capsule Take 1 capsule by mouth daily.   pantoprazole (PROTONIX) 40 MG tablet Take 40 mg by mouth daily.   Phenylephrine HCl (SINEX REGULAR NA) Place 1 spray into the nose daily as needed (congestion).   POTASSIUM PO Take 1 tablet by mouth daily as needed (along with lasix).   rosuvastatin (CRESTOR) 5 MG tablet Take 5 mg by mouth daily.   sertraline (ZOLOFT) 50 MG tablet Take 100 mg by mouth daily.   tamsulosin (FLOMAX) 0.4 MG CAPS capsule Take 0.4 mg by mouth at bedtime.   TOUJEO MAX SOLOSTAR 300 UNIT/ML Solostar Pen Inject 30 Units into the skin in the morning.   zinc sulfate 220 (50 Zn) MG  capsule Take 1 capsule (220 mg total) by mouth daily.   ACCU-CHEK GUIDE test strip USE TO TEST TWICE DAILY.D   blood glucose meter kit and supplies KIT Dispense based on patient and insurance preference. Use up to four times daily as directed. (FOR ICD-9 250.00, 250.01).   Insulin Pen Needle (PEN NEEDLES) 32G X 4 MM MISC 1 Package by Does not apply route 4 (four) times daily -  before meals and at bedtime.   Lancets (ONETOUCH DELICA PLUS [id number]) MISC Apply topically 2 (two) times daily.     Signature:  Chesley Mires, MD [place] Pager - [phone] 05/07/2022, 1:40 PM

## 2022-05-07 NOTE — Progress Notes (Signed)
SLP Cancellation Note  Patient Details Name: Tim Day MRN: [id number] DOB: Jan 29, 1951   Cancelled treatment:       Reason Eval/Treat Not Completed: Patient not medically ready;Medical issues which prohibited therapy. Pt is currently on BiPAP and not appropriate for BSE. SLP will continue efforts,  [name] H. Roddie Mc, CCC-SLP Speech Language Pathologist    Wende Bushy 05/07/2022, 9:08 AM

## 2022-05-07 NOTE — Plan of Care (Signed)

## 2022-05-07 NOTE — Progress Notes (Signed)
Patient taken off of BIPAP and placed on 3L nasal cannula. Patient is tolerating well at this time.

## 2022-05-07 NOTE — Progress Notes (Signed)
*  PRELIMINARY RESULTS* Echocardiogram 2D Echocardiogram has been performed.  Tim Day 05/07/2022, 11:18 AM

## 2022-05-08 DIAGNOSIS — J9621 Acute and chronic respiratory failure with hypoxia: Secondary | ICD-10-CM | POA: Diagnosis not present

## 2022-05-08 DIAGNOSIS — J9602 Acute respiratory failure with hypercapnia: Secondary | ICD-10-CM | POA: Diagnosis not present

## 2022-05-08 DIAGNOSIS — J9601 Acute respiratory failure with hypoxia: Secondary | ICD-10-CM | POA: Diagnosis not present

## 2022-05-08 DIAGNOSIS — J9622 Acute and chronic respiratory failure with hypercapnia: Secondary | ICD-10-CM | POA: Diagnosis not present

## 2022-05-08 DIAGNOSIS — J189 Pneumonia, unspecified organism: Secondary | ICD-10-CM

## 2022-05-08 LAB — BASIC METABOLIC PANEL
Anion gap: 10 (ref 5–15)
BUN: 29 mg/dL — ABNORMAL HIGH (ref 8–23)
CO2: 38 mmol/L — ABNORMAL HIGH (ref 22–32)
Calcium: 8.7 mg/dL — ABNORMAL LOW (ref 8.9–10.3)
Chloride: 95 mmol/L — ABNORMAL LOW (ref 98–111)
Creatinine, Ser: 0.87 mg/dL (ref 0.61–1.24)
GFR, Estimated: >60 mL/min (ref >60)
Glucose, Bld: 54 mg/dL — ABNORMAL LOW (ref 70–99)
Potassium: 3.3 mmol/L — ABNORMAL LOW (ref 3.5–5.1)
Sodium: 143 mmol/L (ref 135–145)

## 2022-05-08 LAB — CBC
HCT: 45.6 % (ref 39.0–52.0)
Hemoglobin: 13.2 g/dL (ref 13.0–17.0)
MCH: 26 pg (ref 26.0–34.0)
MCHC: 28.9 g/dL — ABNORMAL LOW (ref 30.0–36.0)
MCV: 89.9 fL (ref 80.0–100.0)
Platelets: 173 10*3/uL (ref 150–400)
RBC: 5.07 MIL/uL (ref 4.22–5.81)
RDW: 15.8 % — ABNORMAL HIGH (ref 11.5–15.5)
WBC: 7.3 10*3/uL (ref 4.0–10.5)
nRBC: 0 % (ref 0.0–0.2)

## 2022-05-08 LAB — GLUCOSE, CAPILLARY
Glucose-Capillary: 140 mg/dL — ABNORMAL HIGH (ref 70–99)
Glucose-Capillary: 68 mg/dL — ABNORMAL LOW (ref 70–99)
Glucose-Capillary: 97 mg/dL (ref 70–99)
Glucose-Capillary: 165 mg/dL — ABNORMAL HIGH (ref 70–99)
Glucose-Capillary: 112 mg/dL — ABNORMAL HIGH (ref 70–99)
Glucose-Capillary: 182 mg/dL — ABNORMAL HIGH (ref 70–99)

## 2022-05-08 LAB — MAGNESIUM: Magnesium: 2 mg/dL (ref 1.7–2.4)

## 2022-05-08 MED ORDER — SALINE SPRAY 0.65 % NA SOLN
1.0000 | NASAL | Status: DC | PRN
Start: 2022-05-08 — End: 2022-05-11
  Administered 2022-05-08: 1 via NASAL
  Filled 2022-05-08: qty 44

## 2022-05-08 MED ORDER — IPRATROPIUM-ALBUTEROL 0.5-2.5 (3) MG/3ML IN SOLN
3.0000 mL | Freq: Three times a day (TID) | RESPIRATORY_TRACT | Status: DC
Start: 2022-05-09 — End: 2022-05-11
  Administered 2022-05-09 – 2022-05-11 (×8): 3 mL via RESPIRATORY_TRACT
  Filled 2022-05-08: qty 6
  Filled 2022-05-08 (×4): qty 3
  Filled 2022-05-08: qty 6
  Filled 2022-05-08 (×2): qty 3

## 2022-05-08 MED ORDER — POTASSIUM CHLORIDE CRYS ER 20 MEQ PO TBCR
20.0000 meq | EXTENDED_RELEASE_TABLET | Freq: Once | ORAL | Status: AC
  Administered 2022-05-08: 20 meq via ORAL
  Filled 2022-05-08: qty 1

## 2022-05-08 MED ORDER — APIXABAN 5 MG PO TABS
5.0000 mg | ORAL_TABLET | Freq: Two times a day (BID) | ORAL | Status: DC
  Administered 2022-05-08 – 2022-05-11 (×7): 5 mg via ORAL
  Filled 2022-05-08 (×7): qty 1

## 2022-05-08 MED ORDER — POTASSIUM CHLORIDE 20 MEQ PO PACK
20.0000 meq | PACK | Freq: Once | ORAL | Status: AC
  Administered 2022-05-08: 20 meq via ORAL
  Filled 2022-05-08: qty 1

## 2022-05-08 MED ORDER — HYDROCORTISONE 1 % EX CREA
TOPICAL_CREAM | Freq: Three times a day (TID) | CUTANEOUS | Status: DC
Start: 2022-05-08 — End: 2022-05-11
  Filled 2022-05-08: qty 28.4
  Filled 2022-05-08: qty 28

## 2022-05-08 NOTE — TOC Initial Note (Addendum)
Transition of Care [facility]) - Initial/Assessment Note    Patient Details  Name: Tim Day MRN: [id number] Date of Birth: 05-01-1951  [facility] CM/SW Contact:    Iona Beard, [place] Phone Number: 05/08/2022, 10:56 AM  Clinical Narrative:                 CSW updated that PT is recommending HH PT. CSW spoke with pt and daughter in room about this. Pt has been active with [employer] in the past for [facility] and they are requesting this company again. CSW reached out to [facility] rep to see if they can accept referral. CSW awaiting response. Pt also wear 2L O2 at home supplied through [employer].   Addendum 12pm: CSW updated by [facility] rep with [employer] that they are able to accept pt for [facility] PT and OT services at D/C. CSW requested that MD place [facility] PT/OT orders. TOC to follow.   Expected Discharge Plan: [place] Barriers to Discharge: Continued Medical Work up   Patient Goals and CMS Choice Patient states their goals for this hospitalization and ongoing recovery are:: home with [facility] CMS Medicare.gov Compare Post Acute Care list provided to:: Patient Choice offered to / list presented to : Patient, Adult Children  Expected Discharge Plan and Services Expected Discharge Plan: [place] In-house Referral: Clinical Social Work   Post Acute Care Choice: [place] arrangements for the past 2 months: [place]                                      Prior Living Arrangements/Services Living arrangements for the past 2 months: Single Family Home Lives with:: Self Patient language and need for interpreter reviewed:: Yes Do you feel safe going back to the place where you live?: Yes      Need for Family Participation in Patient Care: Yes (Comment) Care giver support system in place?: Yes (comment) Current home services: DME Criminal Activity/Legal Involvement Pertinent to Current Situation/Hospitalization: No - Comment as  needed  Activities of Daily Living Home Assistive Devices/Equipment: [place] Rile (specify type) ADL Screening (condition at time of admission) Patient's cognitive ability adequate to safely complete daily activities?: Yes Is the patient deaf or have difficulty hearing?: No Does the patient have difficulty seeing, even when wearing glasses/contacts?: No Does the patient have difficulty concentrating, remembering, or making decisions?: No Patient able to express need for assistance with ADLs?: Yes Does the patient have difficulty dressing or bathing?: No Independently performs ADLs?: Yes (appropriate for developmental age) Does the patient have difficulty walking or climbing stairs?: Yes Weakness of Legs: Both Weakness of Arms/Hands: None  Permission Sought/Granted                  Emotional Assessment Appearance:: Appears stated age Attitude/Demeanor/Rapport: Engaged Affect (typically observed): Accepting Orientation: : Oriented to Self, Oriented to Place, Oriented to  Time, Oriented to Situation Alcohol / Substance Use: Not Applicable Psych Involvement: No (comment)  Admission diagnosis:  Respiratory failure with hypoxia and hypercapnia (HCC) [J96.91, J96.92] Acute on chronic respiratory failure with hypoxia (HCC) [J96.21] Patient Active Problem List   Diagnosis Date Noted   Respiratory failure with hypoxia and hypercapnia ([place]) 05/06/2022   DOE (dyspnea on exertion) 04/08/2022   Acute respiratory failure with hypercapnia (HCC) 63/84/6659   Acute metabolic encephalopathy    Hyperglycemia due to diabetes  mellitus ([place])    Viral gastroenteritis    Solitary pulmonary nodule on lung CT 08/02/2021   Elevated troponin I level 07/09/2021   Severe sepsis ([place]) 04/09/2021   Acute cholecystitis 04/09/2021   Shock ([place])    Acute on chronic respiratory failure with hypoxia (HCC)    Chronic respiratory failure with hypoxia ([place]) 02/09/2021   Sinus arrhythmia 12/26/2020   Adjustment  disorder with anxious mood 09/15/2020   Protein-calorie malnutrition, severe 08/25/2020   Primary spontaneous pneumothorax    HCAP (healthcare-associated pneumonia)    Pressure injury of skin 08/20/2020   Pneumothorax, left 08/19/2020   Pneumonia due to COVID-19 virus 08/19/2020   Leg DVT (deep venous thromboembolism), acute, bilateral ([place]) 08/19/2020   Sepsis ([place]) 08/08/2020   Hyponatremia 08/08/2020   Hypocalcemia 08/03/2020   Transaminitis 08/03/2020   COVID-19 virus infection 08/02/2020   Thrombocytopenia ([place]) 08/02/2020   Leukopenia 08/02/2020   Esophageal reflux    Chest tube in place 01/22/2015   Hypercholesteremia    Hypertension    Cardiogenic shock ([place]) 01/20/2015   DM2 (diabetes mellitus, type 2) ([place]) 01/20/2015   Benign essential HTN 01/20/2015   Dyslipidemia 01/20/2015   PCP:  Celene Squibb, MD Pharmacy:   [facility], [address] [place] [address] Phone: [phone] Fax: [phone]     Social Determinants of Health (SDOH) Interventions    Readmission Risk Interventions     View : No data to display.
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
## 2022-05-08 NOTE — Plan of Care (Signed)
  Problem: Acute Rehab PT Goals(only PT should resolve) Goal: Pt Will Go Supine/Side To Sit Outcome: Progressing Flowsheets (Taken 05/08/2022 1202) Pt will go Supine/Side to Sit:  with supervision  with min guard assist Goal: Patient Will Perform Sitting Balance Outcome: Progressing Flowsheets (Taken 05/08/2022 1202) Patient will perform sitting balance:  with bilateral UE support  1-2 min  with supervision  with min guard assist Goal: Patient Will Transfer Sit To/From Stand Outcome: Progressing Flowsheets (Taken 05/08/2022 1202) Patient will transfer sit to/from stand:  with supervision  with min guard assist Goal: Pt Will Transfer Bed To Chair/Chair To Bed Outcome: Progressing Flowsheets (Taken 05/08/2022 1202) Pt will Transfer Bed to Chair/Chair to Bed:  min guard assist  with supervision Goal: Pt Will Perform Standing Balance Or Pre-Gait Outcome: Progressing Flowsheets (Taken 05/08/2022 1202) Pt will perform standing balance or pre-gait:  with no UE support  1-2 min  with min guard assist  with Supervision Goal: Pt Will Ambulate Outcome: Progressing Flowsheets (Taken 05/08/2022 1202) Pt will Ambulate:  50 feet  with min guard assist  with minimal assist   12:03 PM, 05/08/22 Lestine Box, S/PT

## 2022-05-08 NOTE — Consult Note (Signed)
[facility]   Patient name: Tim Day Admit date: 05/06/2022  DOB: 1951-07-24 LOS: 2  MRN: [id number] Consult date: 05/07/2022  Referring provider: Dr. Manuella Ghazi, [facility]: Dyspnea    History:  71 yo male presented to [facility] with 2 days of worsening dyspnea, cough and leg swelling.  He has hx of COVID with pulmonary fibrosis and uses 2 to 3 liters oxygen, and is followed by Dr. Melvyn Novas.  In ER his SpO2 was in 70's and he required Bipap therapy.  ABG showed acute on chronic hypoxia/hypercapnia.  PCCM asked to assist with respiratory management.  Past medical history:  Diastolic CHF, DM type 2, HLD, HTN, Chronic pain, GERD, Melanoma, Insomnia, DVT, COVID pneumonia September 2021, Lt pneumothorax September 2021  Significant events:  6/05 Admit  Studies:  PFT 03/07/21 >> FEV1 1.32 (39%), FEV1% 92, TLC 2.13 (30%), DLCO 43% CT angio chest 05/06/22 >> 1.5 cm irregular nodule LUL, pulmonary fibrosis, basilar consolidation Doppler b/l legs 05/06/22 >> nonocclusive DVT Rt profunda femoris vein Echo 05/07/22 >> EF 55 to 60%, mod RV dysfx and enlargement, severe elevation in PASP, 38 mm ascending aorta  Micro:  COVID/Flu 6/05 >> negative  Lines:     Antibiotics:  Unasyn 6/05 >> Zithromax 6/05 >>   Consults:      Interim history:  Breathing better.  Still has cough with clear sputum and some blood streaking.  Not having wheeze.  Was able to walk in his room without difficulty.  Slept well with Bipap overnight.  Vital signs:  BP 103/70   Pulse 84   Temp (!) 97.5 F (36.4 C) (Oral)   Resp (!) 21   Ht '5\' 10"'$  (1.778 m)   Wt 85.9 kg   SpO2 98%   BMI 27.17 kg/m   Intake/output:  I/O last 3 completed shifts: In: 9924 [P.O.:720; I.V.:3; IV Piggyback:850] Out: 6100 [Urine:6100]   Physical exam:   General - alert Eyes - pupils reactive ENT - no sinus tenderness, no stridor Cardiac - regular rate/rhythm, no murmur Chest - equal  breath sounds b/l, no wheezing or rales Abdomen - soft, non tender, + bowel sounds Extremities - no cyanosis, clubbing, or edema Skin - no rashes Neuro - normal strength, moves extremities, follows commands Psych - normal mood and behavior  Best practice:   DVT - Lovenox SUP - N/A Nutrition - carb mod/heart healthy   Discussion:  Patient's chronic respiratory failure due to pulmonary fibrosis is life threatening.  Previous ABG's have documented high PCO2 and spirometry reveals severe restrictive and diffusion ventilatory defects.  Patient would benefit from non-invasive ventilation.  Without this therapy, the patient is at high risk of ending up with worsening symptoms, worsened respiratory failure, need for ER visits and/or recurrent hospitalizations.  Bilevel device unable to adequately support patient's nocturnal ventilation needs.  Patient would benefit from NIV therapy with set tidal volumes and pressure.  Assessment/plan:   Acute on chronic hypoxic/hypercapnic respiratory failure. - from bilateral lower lobe pneumonia in setting of post-COVID pulmonary fibrosis - goal SpO2 > 90% - will need to do ambulatory oximetry prior to discharge to determine optimal home oxygen set up - continue NIV qhs and prn during the day; will see if he can get insurance approval for this therapy at home - day 3 of Abx  Recurrent DVT. - transitioned to eliquis - likely needs indefinite anticoagulation therapy  Pulmonary hypertension. - suspect he needs higher supplemental oxygen settings as outpt - he  will need repeat Echo in few weeks after this hospitalization; if PA pressures still elevated, then might need RHC  Acute on chronic diastolic CHF. DM type 2. Depression/anxiety. - per primary team  Resolved hospital problems:    Goals of care/Family discussions:  Code status: full code  Updated his brother at bedside  Labs:      Latest Ref Rng & Units 05/08/2022    3:45 AM 05/07/2022     3:33 AM 05/06/2022   11:54 AM  CMP  Glucose 70 - 99 mg/dL 54   116   155    BUN 8 - 23 mg/dL 29   34   33    Creatinine 0.61 - 1.24 mg/dL 0.87   1.11   0.89    Sodium 135 - 145 mmol/L 143   145   137    Potassium 3.5 - 5.1 mmol/L 3.3   4.0   4.3    Chloride 98 - 111 mmol/L 95   101   100    CO2 22 - 32 mmol/L 38   32   30    Calcium 8.9 - 10.3 mg/dL 8.7   8.8   8.7    Total Protein 6.5 - 8.1 g/dL  6.7   6.8    Total Bilirubin 0.3 - 1.2 mg/dL  0.8   0.8    Alkaline Phos 38 - 126 U/L  49   49    AST 15 - 41 U/L  14   19    ALT 0 - 44 U/L  21   23         Latest Ref Rng & Units 05/08/2022    3:45 AM 05/07/2022    3:33 AM 05/06/2022   10:32 AM  CBC  WBC 4.0 - 10.5 K/uL 7.3   4.9   5.6    Hemoglobin 13.0 - 17.0 g/dL 13.2   13.0   13.8    Hematocrit 39.0 - 52.0 % 45.6   45.2   47.8    Platelets 150 - 400 K/uL 173   163   149      ABG    Component Value Date/Time   PHART 7.33 (L) 05/07/2022 0239   PCO2ART 84 (HH) 05/07/2022 0239   PO2ART 63 (L) 05/07/2022 0239   HCO3 44.3 (H) 05/07/2022 0239   TCO2 25 11/17/2021 0431   ACIDBASEDEF 1.1 04/09/2021 2012   O2SAT 93.9 05/07/2022 0239    CBG (last 3)  Recent Labs    05/08/22 0408 05/08/22 0729 05/08/22 1105  GLUCAP 140* 97 165*    Signature:  Chesley Mires, MD [place] Pager - [phone] 05/08/2022, 3:52 PM

## 2022-05-08 NOTE — Evaluation (Signed)
Physical Therapy Evaluation Patient Details Name: Tim Day Day: [id number] DOB: 12/23/1950 Today's Date: 05/08/2022  History of Present Illness  Tim Day is a 71 y.o. male with medical history significant of chronic hypoxic respiratory failure on 2-3 L of oxygen at home, chronic HFpEF, DM-2, HTN, HLD presented to the hospital from home with shortness of breath x2 days.  Please note-history is limited as patient is on BiPAP-most of this history is obtained after talking with the patient briefly,spouse and by chart review.  Apparently-for the past 2 days or so-patient had shortness of breath-and worsening lower extremity swelling.  He also had a productive cough.  Family noted worsening leg swelling for the past 4 days-and started giving him his as needed Lasix dose more frequently.  Family bumped up his oxygen to 4 L to alleviate his shortness of breath.  Per history-his shortness of breath is mostly worse with exertion.  Due to worsening shortness of breath- EMS was called-apparently his O2 saturations was in the 70s on 4 L-he was subsequently placed on a nonrebreather mask-and brought to the ED.  In the ED-he was placed on BiPAP.  [facility] was asked to consult for further evaluation and treatment   Clinical Impression  Patient presents supine in bed on BiPAP and consents to physical therapy. Patient transitioned to 5 LPM during evaluation. Patient is min guard/assist with bed mobility and transfers primarily due to UE weakness and decreased trunk control that was observed. Patient demonstrates labored movement and increased time to complete tasks during assessment. Balance deficits observed with transfers. Patient requires HHA when transferring from low surfaces. Patient able to ambulate 15 feet in room with min guard/assist on 5 LPM and vitals assessed averaging 92% SpO2. Patient has slow cadence and limited by SOB and fatigue. Patient left in chair with vitals at appropriate levels.  Patient will benefit from continued skilled physical therapy in hospital and recommended venue below to increase strength, balance, endurance for safe ADLs and gait.      Recommendations for follow up therapy are one component of a multi-disciplinary discharge planning process, led by the attending physician.  Recommendations may be updated based on patient status, additional functional criteria and insurance authorization.  Follow Up Recommendations Home health PT    Assistance Recommended at Discharge PRN  Patient can return home with the following  A little help with walking and/or transfers;Help with stairs or ramp for entrance;A little help with bathing/dressing/bathroom;Assistance with cooking/housework    Equipment Recommendations None recommended by PT  Recommendations for Other Services       Functional Status Assessment Patient has had a recent decline in their functional status and demonstrates the ability to make significant improvements in function in a reasonable and predictable amount of time.     Precautions / Restrictions Precautions Precautions: Fall Restrictions Weight Bearing Restrictions: No      Mobility  Bed Mobility Overal bed mobility: Needs Assistance Bed Mobility: Supine to Sit     Supine to sit: Min assist, HOB elevated     General bed mobility comments: Patient is min assist with supine to sit with need of HHA due to generalized UE weakness and decreased trunk control    Transfers Overall transfer level: Needs assistance   Transfers: Sit to/from Stand, Bed to chair/wheelchair/BSC Sit to Stand: Min guard, Min assist   Step pivot transfers: Min guard, Min assist       General transfer comment: Patient performs sit to stand transfer with min  guard/assist with demonstration of labored movement and increase in time needed to complete task. Patient needs HHA when transfering from commode or lower surfaces. Patient is unsteady with balance  deficits during transfer requiring guarding.    Ambulation/Gait Ambulation/Gait assistance: Min guard, Min assist Gait Distance (Feet): 15 Feet   Gait Pattern/deviations: Step-to pattern, Decreased stride length, Decreased step length - right, Decreased step length - left Gait velocity: decreased     General Gait Details: Patient limited to ambulating 15 steps in room with min guard/assist with no AD. Minor gait deviations observed along with patient demonstrating slow cadence primarily due to balance deficits and fatigue. Vitals assessed with patient averaging 92% SpO2 during gait. Patient more steady during ambulation with HHA.  Stairs            Wheelchair Mobility    Modified Rankin (Stroke Patients Only)       Balance Overall balance assessment: Needs assistance Sitting-balance support: No upper extremity supported, Feet supported Sitting balance-Leahy Scale: Good Sitting balance - Comments: seated at EOB   Standing balance support: No upper extremity supported, During functional activity Standing balance-Leahy Scale: Poor Standing balance comment: Patient has poor standing balance when weight shifting and reaching outside BOS. Patient balance improves with HHA.                             Pertinent Vitals/Pain Pain Assessment Pain Assessment: No/denies pain    Home Living Family/patient expects to be discharged to:: Private residence Living Arrangements: Spouse/significant other;Children Available Help at Discharge: Family;Available 24 hours/day Type of Home: House Home Access: Stairs to enter Entrance Stairs-Rails: Psychiatric nurse of Steps: 6   Home Layout: One level Home Equipment: Grab bars - tub/shower;Shower seat - built Medical sales representative (2 wheels);BSC/3in1;Hand held shower head Additional Comments: On Home O2 2 L continuous;    Prior Function Prior Level of Function : Needs assist             Mobility Comments:  Houshold ambulator typically without AD. Rollator used when outside of the home. ADLs Comments: Independent with ADLs, wife does driving and IADLs     Hand Dominance   Dominant Hand: Right    Extremity/Trunk Assessment   Upper Extremity Assessment Upper Extremity Assessment: Defer to OT evaluation    Lower Extremity Assessment Lower Extremity Assessment: Generalized weakness    Cervical / Trunk Assessment Cervical / Trunk Assessment: Normal  Communication   Communication: No difficulties  Cognition Arousal/Alertness: Awake/alert Behavior During Therapy: WFL for tasks assessed/performed Overall Cognitive Status: Within Functional Limits for tasks assessed                                          General Comments      Exercises     Assessment/Plan    PT Assessment Patient needs continued PT services;All further PT needs can be met in the next venue of care  PT Problem List Decreased strength;Decreased activity tolerance;Decreased balance;Decreased mobility;Decreased coordination       PT Treatment Interventions DME instruction;Gait training;Functional mobility training;Therapeutic activities;Therapeutic exercise;Balance training    PT Goals (Current goals can be found in the Care Plan section)  Acute Rehab PT Goals Patient Stated Goal: return home PT Goal Formulation: With patient Time For Goal Achievement: 05/08/22 Potential to Achieve Goals: Good    Frequency Min 3X/week  Co-evaluation PT/OT/SLP Co-Evaluation/Treatment: Yes Reason for Co-Treatment: To address functional/ADL transfers PT goals addressed during session: Mobility/safety with mobility;Balance;Proper use of DME;Strengthening/ROM OT goals addressed during session: ADL's and self-care       AM-PAC PT "6 Clicks" Mobility  Outcome Measure Help needed turning from your back to your side while in a flat bed without using bedrails?: A Little Help needed moving from lying on  your back to sitting on the side of a flat bed without using bedrails?: A Little Help needed moving to and from a bed to a chair (including a wheelchair)?: A Little Help needed standing up from a chair using your arms (e.g., wheelchair or bedside chair)?: A Little Help needed to walk in hospital room?: A Little Help needed climbing 3-5 steps with a railing? : A Lot 6 Click Score: 17    End of Session   Activity Tolerance: Patient tolerated treatment well;Patient limited by fatigue;No increased pain Patient left: in chair;with family/visitor present Nurse Communication: Mobility status PT Visit Diagnosis: Unsteadiness on feet (R26.81);Other abnormalities of gait and mobility (R26.89);Muscle weakness (generalized) (M62.81)    Time: 3225-6720 PT Time Calculation (min) (ACUTE ONLY): 29 min   Charges:   PT Evaluation $PT Eval Moderate Complexity: 1 Mod PT Treatments $Therapeutic Activity: 23-37 mins        12:02 PM, 05/08/22 Lestine Box, S/PT

## 2022-05-08 NOTE — Progress Notes (Signed)
O2 decreased from 5lpm to 3lpm cann pt spo2 99% nurse informed

## 2022-05-08 NOTE — Evaluation (Signed)
Occupational Therapy Evaluation Patient Details Name: Tim Day MRN: [id number] DOB: 1951/10/15 Today's Date: 05/08/2022   History of Present Illness Tim Day is a 71 y.o. male with medical history significant of chronic hypoxic respiratory failure on 2-3 L of oxygen at home, chronic HFpEF, DM-2, HTN, HLD presented to the hospital from home with shortness of breath x2 days.  Please note-history is limited as patient is on BiPAP-most of this history is obtained after talking with the patient briefly,spouse and by chart review.  Apparently-for the past 2 days or so-patient had shortness of breath-and worsening lower extremity swelling.  He also had a productive cough.  Family noted worsening leg swelling for the past 4 days-and started giving him his as needed Lasix dose more frequently.  Family bumped up his oxygen to 4 L to alleviate his shortness of breath.  Per history-his shortness of breath is mostly worse with exertion.  Due to worsening shortness of breath- EMS was called-apparently his O2 saturations was in the 70s on 4 L-he was subsequently placed on a nonrebreather mask-and brought to the ED.  In the ED-he was placed on BiPAP.  TRH was asked to consult for further evaluation and treatment (Per MD)   Clinical Impression   Pt agreeable to OT and PT co-evaluation. Pt on 2 L baseline supplemental O2 but on 5 L today. Noted to have SpO2 low of 88% when transferring to the toilet, which quickly returned to above 90%. Pt generally weak needing assist at baseline for donning socks. Min A needed for bed mobility and min G to min A for transfers without AD. Pt is unsteady in standing needing assist for transfers. Wife reports ability to provide 24/7 assist at the level demonstrated this date by therapists. Pt will benefit from continued OT in the hospital and recommended venue below to increase strength, balance, and endurance for safe ADL's.        Recommendations for follow up  therapy are one component of a multi-disciplinary discharge planning process, led by the attending physician.  Recommendations may be updated based on patient status, additional functional criteria and insurance authorization.   Follow Up Recommendations  Home health OT    Assistance Recommended at Discharge Intermittent Supervision/Assistance  Patient can return home with the following A little help with walking and/or transfers;A little help with bathing/dressing/bathroom;Assistance with cooking/housework;Assist for transportation;Help with stairs or ramp for entrance    Functional Status Assessment  Patient has had a recent decline in their functional status and demonstrates the ability to make significant improvements in function in a reasonable and predictable amount of time.  Equipment Recommendations  None recommended by OT    Recommendations for Other Services       Precautions / Restrictions Precautions Precautions: Fall Restrictions Weight Bearing Restrictions: No      Mobility Bed Mobility Overal bed mobility: Needs Assistance Bed Mobility: Supine to Sit     Supine to sit: Min assist, HOB elevated     General bed mobility comments: Single hand held assist to pull to sit.    Transfers Overall transfer level: Needs assistance   Transfers: Sit to/from Stand, Bed to chair/wheelchair/BSC Sit to Stand: Min guard, Min assist     Step pivot transfers: Min guard, Min assist     General transfer comment: Labored movement with intermittent single hand held assist.      Balance Overall balance assessment: Needs assistance Sitting-balance support: No upper extremity supported, Feet supported Sitting balance-Leahy Scale: Good  Sitting balance - Comments: seated at EOB   Standing balance support: No upper extremity supported, During functional activity Standing balance-Leahy Scale: Poor Standing balance comment: poor to fair without AD ; improved to fair with  single hand held assist.                           ADL either performed or assessed with clinical judgement   ADL Overall ADL's : Needs assistance/impaired     Grooming: Min guard;Minimal assistance;Standing       Lower Body Bathing: Moderate assistance;Sitting/lateral leans   Upper Body Dressing : Set up;Sitting   Lower Body Dressing: Maximal assistance;Sitting/lateral leans Lower Body Dressing Details (indicate cue type and reason): Max A to don socks seated at EOB. Wife reportedly help pt don compression stocking at baseline. Toilet Transfer: Min guard;Minimal assistance;Ambulation Toilet Transfer Details (indicate cue type and reason): EOB to toilet without AD Toileting- Clothing Manipulation and Hygiene: Set up;Sitting/lateral lean Toileting - Clothing Manipulation Details (indicate cue type and reason): Pt completed peri-care with set up assist after urination.     Functional mobility during ADLs: Min guard;Minimal assistance       Vision Baseline Vision/History: 1 Wears glasses Ability to See in Adequate Light: 1 Impaired Patient Visual Report: No change from baseline Vision Assessment?: No apparent visual deficits                Pertinent Vitals/Pain Pain Assessment Pain Assessment: No/denies pain     Hand Dominance Right   Extremity/Trunk Assessment Upper Extremity Assessment Upper Extremity Assessment: Generalized weakness   Lower Extremity Assessment Lower Extremity Assessment: Defer to PT evaluation   Cervical / Trunk Assessment Cervical / Trunk Assessment: Normal   Communication Communication Communication: No difficulties   Cognition Arousal/Alertness: Awake/alert Behavior During Therapy: WFL for tasks assessed/performed Overall Cognitive Status: Within Functional Limits for tasks assessed                                                        Home Living Family/patient expects to be discharged to::  Private residence Living Arrangements: Spouse/significant other;Children Available Help at Discharge: Family;Available 24 hours/day Type of Home: House Home Access: Stairs to enter [employer] of Steps: 6 Entrance Stairs-Rails: Right;Left Home Layout: One level     Bathroom Shower/Tub: Occupational psychologist: Standard     Home Equipment: Grab bars - tub/shower;Shower seat - built Medical sales representative (2 wheels);BSC/3in1;Hand held shower head   Additional Comments: On Home O2 2 L continuous;      Prior Functioning/Environment Prior Level of Function : Needs assist             Mobility Comments: Houshold ambulator typically without AD. Rollator used when outside of the home. ADLs Comments: Independent with ADLs, wife does driving and IADLs        OT Problem List: Cardiopulmonary status limiting activity;Decreased strength;Decreased activity tolerance;Impaired balance (sitting and/or standing)      OT Treatment/Interventions: Self-care/ADL training;Therapeutic exercise;Therapeutic activities;Patient/family education;Balance training;Energy conservation    OT Goals(Current goals can be found in the care plan section) Acute Rehab OT Goals Patient Stated Goal: return home OT Goal Formulation: With patient Time For Goal Achievement: 05/22/22 Potential to Achieve Goals: Good  OT Frequency: Min 1X/week    Co-evaluation  PT/OT/SLP Co-Evaluation/Treatment: Yes Reason for Co-Treatment: To address functional/ADL transfers   OT goals addressed during session: ADL's and self-care                       End of Session Equipment Utilized During Treatment: Oxygen Nurse Communication: Mobility status  Activity Tolerance: Patient tolerated treatment well Patient left: in chair;with call bell/phone within reach;with family/visitor present  OT Visit Diagnosis: Unsteadiness on feet (R26.81);Other abnormalities of gait and mobility (R26.89);Muscle weakness  (generalized) (M62.81)                Time: 2956-2130 OT Time Calculation (min): 17 min Charges:  OT General Charges $OT Visit: 1 Visit OT Evaluation $OT Eval Low Complexity: 1 Low  Manuel Lawhead OT, MOT  Larey Seat 05/08/2022, 9:36 AM

## 2022-05-08 NOTE — Progress Notes (Signed)
PROGRESS NOTE    Tim Day  WGN:[id number] DOB: Aug 12, 1951 DOA: 05/06/2022 PCP: Celene Squibb, MD   Brief Narrative:    Tim Day is a 71 y.o. male with medical history significant of chronic hypoxic respiratory failure on 2-3 L of oxygen at home, chronic HFpEF, DM-2, HTN, HLD presented to the hospital from home with shortness of breath x2 days.  Patient was admitted with acute on chronic hypoxemic and hypercarbic respiratory failure in the setting of bilateral lower lobe pneumonia with suspicion of aspiration along with acute on chronic diastolic CHF exacerbation.  He has been started on IV antibiotics and IV Lasix.  Pulmonology consulted for assistance in management given BiPAP use and pulmonary fibrosis.  Assessment & Plan:   Principal Problem:   Respiratory failure with hypoxia and hypercapnia (HCC) Active Problems:   Acute on chronic respiratory failure with hypoxia (HCC)  Assessment and Plan:   Acute on chronic hypoxic and hypercarbic respiratory failure (2-3 L of oxygen at baseline):  -Appears to be multifactorial-from decompensated diastolic heart failure, possible aspiration pneumonia. -Appreciate pulmonology evaluation and patient has been weaned off BiPAP to 4-5 L high flow nasal cannula at this time.   -Continue to wean off oxygen supplementation as tolerated toward his baseline -Continue antibiotics and diuresis therapy.   Acute on chronic HFpEF: Appears volume overloaded - trace to 1 + pitting edema -continue IV Lasix-keep in negative balance.  He appears to be diuresing well  -Continue to follow daily weights, strict I's and O's and low-sodium diet.  -2D echocardiogram with LVEF 55-60% and no other acute abnormalities. -Outpatient follow-up to repeat echocardiogram and if still elevated pulmonary artery pressures will require right heart cath.   Possible aspiration pneumonia:  -Will continue Unasyn-and continue Zithromax x3 days for atypical  coverage.   -Currently day 3 of antibiotics.  SLP evaluation when able.   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   -Per history-patient apparently was hospitalized for 17 days with COVID-19-and has required oxygen since then.   -Appreciate pulmonology evaluation and will follow recommendations. -Continue to wean down oxygen supplementation to his baseline; O2 sats goal 90%.   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  VTE was related to COVID-19 infection.  -CTA chest negative-will be on prophylactic Lovenox.  -Ultrasound performed 6/5 positive for right lower extremity DVT.   -Restarted on Eliquis.   DM-2:  -Continue holding oral hypoglycemic agents while inpatient -Follow CBGs fluctuation -Continue sliding scale insulin.   HTN:  -BP currently appears stable -Low sodium diet has been started -Will follow vital signs and resume home antihypertensive agents as needed.   Depression/anxiety:  -Continue holding psych medications for now -Stable mood/affect currently.   History of lung nodule:  -Continue outpatient follow-up with Dr. Melvyn Novas.    DVT prophylaxis: Full dose Lovenox Code Status: Full Family Communication: Wife at bedside 6/7 Disposition Plan:  Status is: Inpatient  Remains inpatient appropriate because: Need for IV medications.  Consultants:  PCCM.  Procedures:  2D echocardiogram 6/6  Antimicrobials:  Anti-infectives (From admission, onward)    Start     Dose/Rate Route Frequency Ordered Stop   05/07/22 1345  azithromycin (ZITHROMAX) 500 mg in sodium chloride 0.9 % 250 mL IVPB        500 mg 250 mL/hr over 60 Minutes Intravenous Every 24 hours 05/06/22 1553 05/10/22 1344  05/06/22 2000  Ampicillin-Sulbactam (UNASYN) 3 g in sodium chloride 0.9 % 100 mL IVPB        3 g 200 mL/hr over  30 Minutes Intravenous Every 6 hours 05/06/22 1559     05/06/22 1700  cefTRIAXone (ROCEPHIN) 1 g in sodium chloride 0.9 % 100 mL IVPB  Status:  Discontinued        1 g 200 mL/hr over 30 Minutes Intravenous Every 24 hours 05/06/22 1445 05/06/22 1553   05/06/22 1500  azithromycin (ZITHROMAX) 500 mg in sodium chloride 0.9 % 250 mL IVPB  Status:  Discontinued        500 mg 250 mL/hr over 60 Minutes Intravenous Every 24 hours 05/06/22 1445 05/06/22 1449   05/06/22 1345  Ampicillin-Sulbactam (UNASYN) 3 g in sodium chloride 0.9 % 100 mL IVPB        3 g 200 mL/hr over 30 Minutes Intravenous  Once 05/06/22 1342 05/06/22 1451   05/06/22 1345  azithromycin (ZITHROMAX) 500 mg in sodium chloride 0.9 % 250 mL IVPB  Status:  Discontinued        500 mg 250 mL/hr over 60 Minutes Intravenous Every 24 hours 05/06/22 1343 05/06/22 1553       Subjective: Off BiPAP on today's examination; reports no chest pain, no nausea, no vomiting, no fever.  Still short winded with activity, intermittently tachypneic and reported increased urine output.  Lower extremity swelling and decreased breath sounds at the bases appreciated on examination.  Objective: Vitals:   05/08/22 1600 05/08/22 1623 05/08/22 1643 05/08/22 1700  BP: 110/74   119/69  Pulse: 78 84 78 93  Resp: (!) 29 19    Temp:  (!) 97.3 F (36.3 C)    TempSrc:  Oral    SpO2: 95% 90% 95% (!) 89%  Weight:      Height:        Intake/Output Summary (Last 24 hours) at 05/08/2022 1754 Last data filed at 05/08/2022 1532 Gross per 24 hour  Intake 1130.11 ml  Output 3350 ml  Net -2219.89 ml   Filed Weights   05/06/22 1026 05/07/22 0346 05/08/22 0332  Weight: 90.7 kg 87.1 kg 85.9 kg    Examination: General exam: Alert, awake, oriented x 3; reports feeling better; no chest pain, no nausea, no vomiting.  Good urine output reported. Respiratory system: Mild expiratory wheezing, positive rhonchi; tachypneic with minimal exertion. Cardiovascular system: Rate  controlled, no rubs, no gallops, no JVD on exam. Gastrointestinal system: Abdomen is nondistended, soft and nontender. No organomegaly or masses felt. Normal bowel sounds heard. Central nervous system: Alert and oriented. No focal neurological deficits. Extremities: No cyanosis or clubbing; trace edema appreciated bilaterally. Skin: No petechiae. Psychiatry: Judgement and insight appear normal. Mood & affect appropriate.   Data Reviewed: I have personally reviewed following labs and imaging studies  CBC: Recent Labs  Lab 05/06/22 1032 05/07/22 0333 05/08/22 0345  WBC 5.6 4.9 7.3  NEUTROABS 4.5  --   --   HGB 13.8 13.0 13.2  HCT 47.8 45.2 45.6  MCV 90.5 90.6 89.9  PLT 149* 163 962   Basic Metabolic Panel: Recent Labs  Lab 05/06/22 1154 05/07/22 0333 05/08/22 0345  NA 137 145 143  K 4.3 4.0 3.3*  CL 100 101 95*  CO2 30 32 38*  GLUCOSE 155* 116* 54*  BUN 33* 34* 29*  CREATININE 0.89 1.11 0.87  CALCIUM 8.7* 8.8* 8.7*  MG 2.0  --  2.0   GFR: Estimated Creatinine Clearance:  83.9 mL/min (by C-G formula based on SCr of 0.87 mg/dL).  Liver Function Tests: Recent Labs  Lab 05/06/22 1154 05/07/22 0333  AST 19 14*  ALT 23 21  ALKPHOS 49 49  BILITOT 0.8 0.8  PROT 6.8 6.7  ALBUMIN 3.5 3.3*   Coagulation Profile: Recent Labs  Lab 05/06/22 2148  INR 1.3*   CBG: Recent Labs  Lab 05/08/22 0332 05/08/22 0408 05/08/22 0729 05/08/22 1105 05/08/22 1613  GLUCAP 68* 140* 97 165* 112*   Sepsis Labs: Recent Labs  Lab 05/06/22 1154  PROCALCITON <0.10    Recent Results (from the past 240 hour(s))  Resp Panel by RT-PCR (Flu A&B, Covid) Anterior Nasal Swab     Status: None   Collection Time: 05/06/22 10:32 AM   Specimen: Anterior Nasal Swab  Result Value Ref Range Status   SARS Coronavirus 2 by RT PCR NEGATIVE NEGATIVE Final    Comment: (NOTE) SARS-CoV-2 target nucleic acids are NOT DETECTED.  The SARS-CoV-2 RNA is generally detectable in upper  respiratory specimens during the acute phase of infection. The lowest concentration of SARS-CoV-2 viral copies this assay can detect is 138 copies/mL. A negative result does not preclude SARS-Cov-2 infection and should not be used as the sole basis for treatment or other patient management decisions. A negative result may occur with  improper specimen collection/handling, submission of specimen other than nasopharyngeal swab, presence of viral mutation(s) within the areas targeted by this assay, and inadequate number of viral copies(<138 copies/mL). A negative result must be combined with clinical observations, patient history, and epidemiological information. The expected result is Negative.  Fact Sheet for Patients:  EntrepreneurPulse.com.au  Fact Sheet for Healthcare Providers:  IncredibleEmployment.be  This test is no t yet approved or cleared by the [place] FDA and  has been authorized for detection and/or diagnosis of SARS-CoV-2 by FDA under an Emergency Use Authorization (EUA). This EUA will remain  in effect (meaning this test can be used) for the duration of the COVID-19 declaration under Section 564(b)(1) of the Act, 21 U.S.C.section 360bbb-3(b)(1), unless the authorization is terminated  or revoked sooner.       Influenza A by PCR NEGATIVE NEGATIVE Final   Influenza B by PCR NEGATIVE NEGATIVE Final    Comment: (NOTE) The Xpert Xpress SARS-CoV-2/FLU/RSV plus assay is intended as an aid in the diagnosis of influenza from Nasopharyngeal swab specimens and should not be used as a sole basis for treatment. Nasal washings and aspirates are unacceptable for Xpert Xpress SARS-CoV-2/FLU/RSV testing.  Fact Sheet for Patients: EntrepreneurPulse.com.au  Fact Sheet for Healthcare Providers: IncredibleEmployment.be  This test is not yet approved or cleared by the [place] FDA and has been  authorized for detection and/or diagnosis of SARS-CoV-2 by FDA under an Emergency Use Authorization (EUA). This EUA will remain in effect (meaning this test can be used) for the duration of the COVID-19 declaration under Section 564(b)(1) of the Act, 21 U.S.C. section 360bbb-3(b)(1), unless the authorization is terminated or revoked.  Performed at [facility], [address] [place], [place] [address]   MRSA Next Gen by PCR, Nasal     Status: Abnormal   Collection Time: 05/06/22  3:31 PM   Specimen: Nasal Mucosa; Nasal Swab  Result Value Ref Range Status   MRSA by PCR Next Gen DETECTED (A) NOT DETECTED Final    Comment: RCRV K EDWARDS AT 1720 ON 05.05.23 BY ADGER J         The GeneXpert MRSA Assay (FDA  approved for NASAL specimens only), is one component of a comprehensive MRSA colonization surveillance program. It is not intended to diagnose MRSA infection nor to guide or monitor treatment for MRSA infections. Performed at [facility], [address] [place], [place] [address]     Radiology Studies: US Venous Img Lower Bilateral (DVT)  Result Date: 05/06/2022 CLINICAL DATA:  Bilateral lower extremity swelling. EXAM: BILATERAL LOWER EXTREMITY VENOUS DOPPLER ULTRASOUND TECHNIQUE: Gray-scale sonography with compression, as well as color and duplex ultrasound, were performed to evaluate the deep venous system(s) from the level of the common femoral vein through the popliteal and proximal calf veins. COMPARISON:  None Available. FINDINGS: VENOUS The right profunda femoris vein is partially compressible with flow consistent with nonocclusive DVT. The remaining right lower extremity veins are patent with normal flow. Doppler waveform shows normal direction of the venous flow. There is normal compressibility of the left common femoral, superficial femoral, and popliteal veins, as well as the visualized calf veins. Visualized portions of profunda femoral vein and great saphenous vein  unremarkable. No filling defects to suggest DVT on grayscale or color Doppler imaging. Doppler waveforms show normal direction of venous flow, normal respiratory plasticity and response to augmentation. OTHER There is marked subcutaneous soft tissue edema in bilateral calves. Limitations: none IMPRESSION: 1.  Nonocclusive DVT of the right profunda femoris vein. 2. The remaining right lower extremity and left lower extremity veins are patent. Electronically Signed   By: Keane Police D.O.   On: 05/06/2022 19:31   DG CHEST PORT 1 VIEW  Result Date: 05/07/2022 CLINICAL DATA:  Choking episode while drinking water, developed shortness of breath than oxygen desaturation EXAM: PORTABLE CHEST 1 VIEW COMPARISON:  Portable exam 1453 hours compared to 05/06/2022 FINDINGS: Enlargement of cardiac silhouette. Mediastinal contours and pulmonary vascularity normal. Chronic accentuation of interstitial markings. Decreased bibasilar atelectasis. No definite new infiltrate, pleural effusion, or pneumothorax. Calcified granuloma LEFT upper lobe. IMPRESSION: Improved aeration since prior study. Electronically Signed   By: Lavonia Dana M.D.   On: 05/07/2022 15:04   ECHOCARDIOGRAM COMPLETE  Result Date: 05/07/2022    ECHOCARDIOGRAM REPORT   Patient Name:   Tim Day Date of Exam: 05/07/2022 Medical Rec #:  [id number]            Height:       70.0 in Accession #:    [id number]           Weight:       192.0 lb Date of Birth:  1951-10-01            BSA:          2.052 m Patient Age:    34 years             BP:           121/71 mmHg Patient Gender: M                    HR:           85 bpm. Exam Location:  [facility] Procedure: 2D Echo, Cardiac Doppler, Color Doppler and Intracardiac            Opacification Agent Indications:    CHF  History:        Patient has prior history of Echocardiogram examinations, most                 recent 04/11/2021. Abnormal ECG, Signs/Symptoms:Dyspnea; Risk  Factors:Hypertension,  Diabetes and Dyslipidemia.  Sonographer:    Wenda Low Referring Phys: [place]  1. Left ventricular ejection fraction, by estimation, is 55 to 60%. The left ventricle has normal function. The left ventricle has no regional wall motion abnormalities. Left ventricular diastolic parameters were normal.  2. Right ventricular systolic function is moderately reduced. The right ventricular size is moderately enlarged. There is severely elevated pulmonary artery systolic pressure.  3. Right atrial size was mildly dilated.  4. The mitral valve is normal in structure. No evidence of mitral valve regurgitation. No evidence of mitral stenosis.  5. The aortic valve is tricuspid. There is mild calcification of the aortic valve. There is mild thickening of the aortic valve. Aortic valve regurgitation is not visualized. Aortic valve sclerosis is present, with no evidence of aortic valve stenosis.  6. Aortic dilatation noted. There is mild dilatation of the ascending aorta, measuring 38 mm.  7. The inferior vena cava is normal in size with greater than 50% respiratory variability, suggesting right atrial pressure of 3 mmHg. FINDINGS  Left Ventricle: Left ventricular ejection fraction, by estimation, is 55 to 60%. The left ventricle has normal function. The left ventricle has no regional wall motion abnormalities. Definity contrast agent was given IV to delineate the left ventricular  endocardial borders. The left ventricular internal cavity size was normal in size. There is no left ventricular hypertrophy. Left ventricular diastolic parameters were normal. Right Ventricle: The right ventricular size is moderately enlarged. No increase in right ventricular wall thickness. Right ventricular systolic function is moderately reduced. There is severely elevated pulmonary artery systolic pressure. The tricuspid regurgitant velocity is 3.72 m/s, and with an assumed right atrial pressure of 15 mmHg, the  estimated right ventricular systolic pressure is 01.7 mmHg. Left Atrium: Left atrial size was normal in size. Right Atrium: Right atrial size was mildly dilated. Pericardium: There is no evidence of pericardial effusion. Mitral Valve: The mitral valve is normal in structure. No evidence of mitral valve regurgitation. No evidence of mitral valve stenosis. MV peak gradient, 2.1 mmHg. The mean mitral valve gradient is 1.0 mmHg. Tricuspid Valve: The tricuspid valve is normal in structure. Tricuspid valve regurgitation is not demonstrated. No evidence of tricuspid stenosis. Aortic Valve: The aortic valve is tricuspid. There is mild calcification of the aortic valve. There is mild thickening of the aortic valve. Aortic valve regurgitation is not visualized. Aortic valve sclerosis is present, with no evidence of aortic valve stenosis. Aortic valve mean gradient measures 3.0 mmHg. Aortic valve peak gradient measures 5.5 mmHg. Aortic valve area, by VTI measures 2.22 cm. Pulmonic Valve: The pulmonic valve was normal in structure. Pulmonic valve regurgitation is trivial. No evidence of pulmonic stenosis. Aorta: Aortic dilatation noted. There is mild dilatation of the ascending aorta, measuring 38 mm. Venous: The inferior vena cava is normal in size with greater than 50% respiratory variability, suggesting right atrial pressure of 3 mmHg. IAS/Shunts: No atrial level shunt detected by color flow Doppler.  LEFT VENTRICLE PLAX 2D LVIDd:         4.60 cm   Diastology LVIDs:         2.50 cm   LV e' medial:    8.27 cm/s LV PW:         0.80 cm   LV E/e' medial:  6.7 LV IVS:        0.80 cm   LV e' lateral:   13.10 cm/s LVOT diam:  2.00 cm   LV E/e' lateral: 4.2 LV SV:         47 LV SV Index:   23 LVOT Area:     3.14 cm  RIGHT VENTRICLE RV Basal diam:  3.90 cm RV Mid diam:    3.90 cm RV S prime:     10.90 cm/s TAPSE (M-mode): 2.1 cm LEFT ATRIUM             Index        RIGHT ATRIUM           Index LA diam:        3.30 cm 1.61 cm/m    RA Area:     21.70 cm LA Vol (A2C):   61.8 ml 30.12 ml/m  RA Volume:   59.30 ml  28.91 ml/m LA Vol (A4C):   48.0 ml 23.40 ml/m LA Biplane Vol: 59.4 ml 28.95 ml/m  AORTIC VALVE                    PULMONIC VALVE AV Area (Vmax):    2.23 cm     PV Vmax:       0.63 m/s AV Area (Vmean):   1.95 cm     PV Peak grad:  1.6 mmHg AV Area (VTI):     2.22 cm AV Vmax:           117.00 cm/s AV Vmean:          80.400 cm/s AV VTI:            0.214 m AV Peak Grad:      5.5 mmHg AV Mean Grad:      3.0 mmHg LVOT Vmax:         83.00 cm/s LVOT Vmean:        50.000 cm/s LVOT VTI:          0.151 m LVOT/AV VTI ratio: 0.71  AORTA Ao Root diam: 3.80 cm Ao Asc diam:  4.10 cm MITRAL VALVE               TRICUSPID VALVE MV Area (PHT): 2.95 cm    TR Peak grad:   55.4 mmHg MV Area VTI:   2.37 cm    TR Vmax:        372.00 cm/s MV Peak grad:  2.1 mmHg MV Mean grad:  1.0 mmHg    SHUNTS MV Vmax:       0.73 m/s    Systemic VTI:  0.15 m MV Vmean:      46.6 cm/s   Systemic Diam: 2.00 cm MV Decel Time: 257 msec MV E velocity: 55.50 cm/s MV A velocity: 77.10 cm/s MV E/A ratio:  0.72 Jenkins Rouge MD Electronically signed by Jenkins Rouge MD Signature Date/Time: 05/07/2022/12:25:42 PM    Final     Scheduled Meds:  apixaban  5 mg Oral BID   chlorhexidine  15 mL Mouth Rinse BID   Chlorhexidine Gluconate Cloth  6 each Topical Q0600   furosemide  40 mg Intravenous Q12H   insulin aspart  0-9 Units Subcutaneous Q4H   insulin glargine-yfgn  10 Units Subcutaneous q AM   ipratropium-albuterol  3 mL Nebulization Q6H   mouth rinse  15 mL Mouth Rinse q12n4p   mupirocin ointment  1 application. Nasal BID   potassium chloride  20 mEq Oral Once   sodium chloride flush  3 mL Intravenous Q12H   Continuous Infusions:  sodium chloride  ampicillin-sulbactam (UNASYN) IV Stopped (05/08/22 1507)   azithromycin Stopped (05/08/22 1412)     LOS: 2 days   Barton Dubois, MD [facility]  If 7PM-7AM, please contact  night-coverage [email or web address] 05/08/2022, 5:54 PM

## 2022-05-08 NOTE — Plan of Care (Signed)
  Problem: Acute Rehab OT Goals (only OT should resolve) Goal: Pt. Will Perform Grooming Flowsheets (Taken 05/08/2022 0938) Pt Will Perform Grooming:  standing  with supervision Goal: Pt. Will Perform Lower Body Bathing Flowsheets (Taken 05/08/2022 0938) Pt Will Perform Lower Body Bathing:  with supervision  sitting/lateral leans  with adaptive equipment Goal: Pt. Will Perform Lower Body Dressing Flowsheets (Taken 05/08/2022 0938) Pt Will Perform Lower Body Dressing:  with set-up  sitting/lateral leans  sit to/from stand  with adaptive equipment Goal: Pt. Will Transfer To Toilet [place] (Taken 05/08/2022 [phone]) Pt Will Transfer to Toilet:  with modified independence  ambulating Goal: Pt/Caregiver Will Perform Home Exercise Program Flowsheets (Taken 05/08/2022 [phone]) Pt/caregiver will Perform Home Exercise Program:  Increased strength  Both right and left upper extremity  Independently  Emmamarie Kluender OT, MOT

## 2022-05-09 DIAGNOSIS — J9601 Acute respiratory failure with hypoxia: Secondary | ICD-10-CM | POA: Diagnosis not present

## 2022-05-09 DIAGNOSIS — J9622 Acute and chronic respiratory failure with hypercapnia: Secondary | ICD-10-CM | POA: Diagnosis not present

## 2022-05-09 DIAGNOSIS — J9602 Acute respiratory failure with hypercapnia: Secondary | ICD-10-CM | POA: Diagnosis not present

## 2022-05-09 DIAGNOSIS — J9621 Acute and chronic respiratory failure with hypoxia: Secondary | ICD-10-CM | POA: Diagnosis not present

## 2022-05-09 DIAGNOSIS — J189 Pneumonia, unspecified organism: Secondary | ICD-10-CM | POA: Diagnosis not present

## 2022-05-09 LAB — CBC
HCT: 44.4 % (ref 39.0–52.0)
Hemoglobin: 12.7 g/dL — ABNORMAL LOW (ref 13.0–17.0)
MCH: 25.6 pg — ABNORMAL LOW (ref 26.0–34.0)
MCHC: 28.6 g/dL — ABNORMAL LOW (ref 30.0–36.0)
MCV: 89.5 fL (ref 80.0–100.0)
Platelets: 153 10*3/uL (ref 150–400)
RBC: 4.96 MIL/uL (ref 4.22–5.81)
RDW: 15.6 % — ABNORMAL HIGH (ref 11.5–15.5)
WBC: 5.9 10*3/uL (ref 4.0–10.5)
nRBC: 0 % (ref 0.0–0.2)

## 2022-05-09 LAB — BASIC METABOLIC PANEL
Anion gap: 10 (ref 5–15)
BUN: 22 mg/dL (ref 8–23)
CO2: 37 mmol/L — ABNORMAL HIGH (ref 22–32)
Calcium: 8.2 mg/dL — ABNORMAL LOW (ref 8.9–10.3)
Chloride: 94 mmol/L — ABNORMAL LOW (ref 98–111)
Creatinine, Ser: 0.77 mg/dL (ref 0.61–1.24)
GFR, Estimated: >60 mL/min (ref >60)
Glucose, Bld: 116 mg/dL — ABNORMAL HIGH (ref 70–99)
Potassium: 3.1 mmol/L — ABNORMAL LOW (ref 3.5–5.1)
Sodium: 141 mmol/L (ref 135–145)

## 2022-05-09 LAB — GLUCOSE, CAPILLARY
Glucose-Capillary: 106 mg/dL — ABNORMAL HIGH (ref 70–99)
Glucose-Capillary: 118 mg/dL — ABNORMAL HIGH (ref 70–99)
Glucose-Capillary: 181 mg/dL — ABNORMAL HIGH (ref 70–99)
Glucose-Capillary: 191 mg/dL — ABNORMAL HIGH (ref 70–99)
Glucose-Capillary: 173 mg/dL — ABNORMAL HIGH (ref 70–99)

## 2022-05-09 MED ORDER — POTASSIUM CHLORIDE CRYS ER 20 MEQ PO TBCR
40.0000 meq | EXTENDED_RELEASE_TABLET | ORAL | Status: AC
  Administered 2022-05-09 (×3): 40 meq via ORAL
  Filled 2022-05-09 (×3): qty 2

## 2022-05-09 NOTE — TOC Progression Note (Signed)
Transition of Care [facility]) - Progression Note    Patient Details  Name: TAITUM ALMS MRN: [id number] Date of Birth: 1951-06-26  [facility] CM/SW Contact  Salome Arnt, [place] Phone Number: 05/09/2022, 2:33 PM  Clinical Narrative:  Per pulmonology, pt needs NIV. Discussed with [employer] (pt has home O2 with [employer]) and pt does not have diagnosis for insurance to cover NIV. Pulmonologist recommended Bipap. Per [employer], this should be approved. Form left on MD desk for signature. TOC will send to [place] when signed. Updated wife who is agreeable.     Expected Discharge Plan: [place] Barriers to Discharge: Continued Medical Work up  Expected Discharge Plan and Services Expected Discharge Plan: [place] In-house Referral: Clinical Social Work   Post Acute Care Choice: [place] arrangements for the past 2 months: Single Family Home                                       Social Determinants of Health (SDOH) Interventions    Readmission Risk Interventions     No data to display

## 2022-05-09 NOTE — Progress Notes (Signed)
PROGRESS NOTE    Tim Day  RUE:[id number] DOB: 1951-10-10 DOA: 05/06/2022 PCP: Celene Squibb, MD   Brief Narrative:    Tim Day is a 71 y.o. male with medical history significant of chronic hypoxic respiratory failure on 2-3 L of oxygen at home, chronic HFpEF, DM-2, HTN, HLD presented to the hospital from home with shortness of breath x2 days.  Patient was admitted with acute on chronic hypoxemic and hypercarbic respiratory failure in the setting of bilateral lower lobe pneumonia with suspicion of aspiration along with acute on chronic diastolic CHF exacerbation.  He has been started on IV antibiotics and IV Lasix.  Pulmonology consulted for assistance in management given BiPAP use and pulmonary fibrosis.  Assessment & Plan:   Principal Problem:   Respiratory failure with hypoxia and hypercapnia (HCC) Active Problems:   Acute on chronic respiratory failure with hypoxia (HCC)  Assessment and Plan:   Acute on chronic hypoxic and hypercarbic respiratory failure (2-3 L of oxygen at baseline):  -Appears to be multifactorial-from decompensated diastolic heart failure, possible aspiration pneumonia. -Appreciate pulmonology assistance and recommendation -Continue to use BiPAP nightly -Continue diuresis and current oral antibiotics. -Will continue current bronchodilator management and wean off oxygen supplementation towards his baseline as tolerated. -Currently using 4 L nasal cannula supplementation. -Desaturation screening requested.  Acute on chronic HFpEF: Appears volume overloaded - trace to 1 + pitting edema appreciated on exam. -continue IV Lasix with intentions to keep in negative balance.  He appears to be diuresing well  -Continue to follow daily weights, strict I's and O's and low-sodium diet.  -2D echocardiogram with LVEF 55-60% and no other acute abnormalities. -Outpatient follow-up to repeat echocardiogram and if still elevated pulmonary artery pressures  will require right heart cath. -REDsClipp order in am.   Possible aspiration pneumonia:  -Continue treatment with Unasyn -Patient completed 3 days of Zithromax as well -Speech therapy has recommended regular diet with thin liquids. -Continue supportive care.     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   -Per history-patient apparently was hospitalized for 17 days with COVID-19-and has required oxygen since then.   -Appreciate pulmonology evaluation and will follow recommendations. -Home BiPAP forms has been signed and requested; patient will benefit of the use of BiPAP nightly. -Continue to wean down oxygen supplementation to his baseline; O2 sats goal 90%.   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  VTE was related to COVID-19 infection.  -CTA chest negative-will be on prophylactic Lovenox.  -Ultrasound performed 6/5 positive for right lower extremity DVT.   -Restarted on Eliquis; anticipating the need for long-term anticoagulation therapy..   DM-2:  -Continue holding oral hypoglycemic agents while inpatient -Continue to follow CBGs and further adjust hypoglycemic regimen as required. -Continue sliding scale insulin.   HTN:  -BP currently appears stable -Low sodium diet has been started -Will follow vital signs and resume home antihypertensive agents as needed.   Depression/anxiety:  -Continue holding psych medications for now -No hallucinations or psychosis. -Stable mood.   History of lung nodule:  -Continue outpatient follow-up with Dr. Melvyn Novas.    DVT prophylaxis: Full dose Lovenox Code Status: Full Family Communication: Wife at bedside 6/7 Disposition Plan:  Status is: Inpatient  Remains inpatient appropriate because: Need for IV medications.  Consultants:  PCCM.  Procedures:  2D  echocardiogram 6/6  Antimicrobials:  Anti-infectives (From admission,  onward)    Start     Dose/Rate Route Frequency Ordered Stop   05/07/22 1345  azithromycin (ZITHROMAX) 500 mg in sodium chloride 0.9 % 250 mL IVPB        500 mg 250 mL/hr over 60 Minutes Intravenous Every 24 hours 05/06/22 1553 05/09/22 1544   05/06/22 2000  Ampicillin-Sulbactam (UNASYN) 3 g in sodium chloride 0.9 % 100 mL IVPB        3 g 200 mL/hr over 30 Minutes Intravenous Every 6 hours 05/06/22 1559     05/06/22 1700  cefTRIAXone (ROCEPHIN) 1 g in sodium chloride 0.9 % 100 mL IVPB  Status:  Discontinued        1 g 200 mL/hr over 30 Minutes Intravenous Every 24 hours 05/06/22 1445 05/06/22 1553   05/06/22 1500  azithromycin (ZITHROMAX) 500 mg in sodium chloride 0.9 % 250 mL IVPB  Status:  Discontinued        500 mg 250 mL/hr over 60 Minutes Intravenous Every 24 hours 05/06/22 1445 05/06/22 1449   05/06/22 1345  Ampicillin-Sulbactam (UNASYN) 3 g in sodium chloride 0.9 % 100 mL IVPB        3 g 200 mL/hr over 30 Minutes Intravenous  Once 05/06/22 1342 05/06/22 1451   05/06/22 1345  azithromycin (ZITHROMAX) 500 mg in sodium chloride 0.9 % 250 mL IVPB  Status:  Discontinued        500 mg 250 mL/hr over 60 Minutes Intravenous Every 24 hours 05/06/22 1343 05/06/22 1553       Subjective: Of BiPAP at time of examination; good saturation appreciated on 4 L nasal cannula supplementation.  Afebrile, no chest pain, no nausea, no vomiting.  Still feeling short winded with activity and reporting mild orthopnea and lower extremity swelling.  Objective: Vitals:   05/09/22 1500 05/09/22 1600 05/09/22 1700 05/09/22 1800  BP: 110/71 105/68 119/69 118/67  Pulse: 75 77 96 82  Resp:      Temp:  97.8 F (36.6 C)    TempSrc:  Oral    SpO2: 95% (!) 84% (!) 81% 92%  Weight:      Height:        Intake/Output Summary (Last 24 hours) at 05/09/2022 1829 Last data filed at 05/09/2022 1805 Gross per 24 hour  Intake 1490.06 ml  Output  3675 ml  Net -2184.94 ml   Filed Weights   05/06/22 1026 05/07/22 0346 05/08/22 0332  Weight: 90.7 kg 87.1 kg 85.9 kg    Examination: General exam: Alert, awake, oriented x 3; currently using 4 L nasal cannula supplementation; reporting improvement in his breathing overall, still short winded especially with activity and reporting swelling in his lower extremities and mild orthopnea.  Good urine output reported. Respiratory system: Decreased breath sounds at the bases, positive rhonchi, mild expiratory wheezing appreciated.  No using accessory muscles. Cardiovascular system:RRR. No murmurs, rubs, gallops.  No JVD. Gastrointestinal system: Abdomen is nondistended, soft and nontender. No organomegaly or masses felt. Normal bowel sounds heard. Central nervous system: Alert and oriented. No focal neurological deficits. Extremities: No cyanosis or clubbing; trace to 1+ edema appreciated bilaterally. Skin: No petechiae. Psychiatry: Judgement and insight appear normal. Mood & affect appropriate.   Data Reviewed: I have personally reviewed following labs and imaging studies  CBC: Recent Labs  Lab 05/06/22 1032 05/07/22 0333 05/08/22 0345 05/09/22 0436  WBC 5.6 4.9 7.3 5.9  NEUTROABS 4.5  --   --   --   HGB 13.8 13.0 13.2 12.7*  HCT 47.8 45.2 45.6 44.4  MCV 90.5 90.6 89.9 89.5  PLT 149* 163 173 657   Basic Metabolic Panel: Recent Labs  Lab 05/06/22 1154 05/07/22 0333 05/08/22 0345 05/09/22 0436  NA 137 145 143 141  K 4.3 4.0 3.3* 3.1*  CL 100 101 95* 94*  CO2 30 32 38* 37*  GLUCOSE 155* 116* 54* 116*  BUN 33* 34* 29* 22  CREATININE 0.89 1.11 0.87 0.77  CALCIUM 8.7* 8.8* 8.7* 8.2*  MG 2.0  --  2.0  --    GFR: Estimated Creatinine Clearance: 91.3 mL/min (by C-G formula based on SCr of 0.77 mg/dL).  Liver Function Tests: Recent Labs  Lab 05/06/22 1154 05/07/22 0333  AST 19 14*  ALT 23 21  ALKPHOS 49 49  BILITOT 0.8 0.8  PROT 6.8 6.7  ALBUMIN 3.5 3.3*    Coagulation Profile: Recent Labs  Lab 05/06/22 2148  INR 1.3*   CBG: Recent Labs  Lab 05/08/22 2028 05/09/22 0116 05/09/22 0736 05/09/22 1120 05/09/22 1618  GLUCAP 182* 106* 118* 181* 191*   Sepsis Labs: Recent Labs  Lab 05/06/22 1154  PROCALCITON <0.10    Recent Results (from the past 240 hour(s))  Resp Panel by RT-PCR (Flu A&B, Covid) Anterior Nasal Swab     Status: None   Collection Time: 05/06/22 10:32 AM   Specimen: Anterior Nasal Swab  Result Value Ref Range Status   SARS Coronavirus 2 by RT PCR NEGATIVE NEGATIVE Final    Comment: (NOTE) SARS-CoV-2 target nucleic acids are NOT DETECTED.  The SARS-CoV-2 RNA is generally detectable in upper respiratory specimens during the acute phase of infection. The lowest concentration of SARS-CoV-2 viral copies this assay can detect is 138 copies/mL. A negative result does not preclude SARS-Cov-2 infection and should not be used as the sole basis for treatment or other patient management decisions. A negative result may occur with  improper specimen collection/handling, submission of specimen other than nasopharyngeal swab, presence of viral mutation(s) within the areas targeted by this assay, and inadequate number of viral copies(<138 copies/mL). A negative result must be combined with clinical observations, patient history, and epidemiological information. The expected result is Negative.  Fact Sheet for Patients:  EntrepreneurPulse.com.au  Fact Sheet for Healthcare Providers:  IncredibleEmployment.be  This test is no t yet approved or cleared by the [place] FDA and  has been authorized for detection and/or diagnosis of SARS-CoV-2 by FDA under an Emergency Use Authorization (EUA). This EUA will remain  in effect (meaning this test can be used) for the duration of the COVID-19 declaration under Section 564(b)(1) of the Act, 21 U.S.C.section 360bbb-3(b)(1), unless the  authorization is terminated  or revoked sooner.       Influenza A by PCR NEGATIVE NEGATIVE Final   Influenza B by PCR NEGATIVE NEGATIVE Final    Comment: (NOTE) The Xpert Xpress SARS-CoV-2/FLU/RSV plus assay is intended as an aid in the diagnosis of influenza from Nasopharyngeal swab specimens and should not be used as a sole basis for treatment. Nasal washings and aspirates are unacceptable for Xpert Xpress SARS-CoV-2/FLU/RSV testing.  Fact Sheet for Patients: EntrepreneurPulse.com.au  Fact Sheet for Healthcare Providers: IncredibleEmployment.be  This test is not yet approved or cleared by the [place] FDA and has been authorized for detection and/or diagnosis of SARS-CoV-2 by FDA under an Emergency Use Authorization (EUA). This EUA will remain in effect (meaning this test can be used) for the duration of the COVID-19 declaration under Section 564(b)(1) of the  Act, 21 U.S.C. section 360bbb-3(b)(1), unless the authorization is terminated or revoked.  Performed at [facility], [address] [place], [place] [address]   MRSA Next Gen by PCR, Nasal     Status: Abnormal   Collection Time: 05/06/22  3:31 PM   Specimen: Nasal Mucosa; Nasal Swab  Result Value Ref Range Status   MRSA by PCR Next Gen DETECTED (A) NOT DETECTED Final    Comment: RCRV K EDWARDS AT 1720 ON 05.05.23 BY ADGER J         The GeneXpert MRSA Assay (FDA approved for NASAL specimens only), is one component of a comprehensive MRSA colonization surveillance program. It is not intended to diagnose MRSA infection nor to guide or monitor treatment for MRSA infections. Performed at [facility], [address] [place], [place] [address]     Radiology Studies: No results found.  Scheduled Meds:  apixaban  5 mg Oral BID   chlorhexidine  15 mL Mouth Rinse BID   Chlorhexidine Gluconate Cloth  6 each Topical Q0600   furosemide  40 mg Intravenous Q12H    hydrocortisone cream   Topical TID   insulin aspart  0-9 Units Subcutaneous Q4H   insulin glargine-yfgn  10 Units Subcutaneous q AM   ipratropium-albuterol  3 mL Nebulization TID   mouth rinse  15 mL Mouth Rinse q12n4p   mupirocin ointment  1 application  Nasal BID   sodium chloride flush  3 mL Intravenous Q12H   Continuous Infusions:  sodium chloride     ampicillin-sulbactam (UNASYN) IV Stopped (05/09/22 1416)     LOS: 3 days   Barton Dubois, MD [facility]  If 7PM-7AM, please contact night-coverage [email or web address] 05/09/2022, 6:29 PM

## 2022-05-09 NOTE — Progress Notes (Signed)
[facility]   Patient name: Tim Day Admit date: 05/06/2022  DOB: 11/08/1951 LOS: 3  MRN: [id number] Consult date: 05/07/2022  Referring provider: Dr. Manuella Ghazi, [facility]: Dyspnea    History:  71 yo male presented to [facility] with 2 days of worsening dyspnea, cough and leg swelling.  He has hx of COVID with pulmonary fibrosis and uses 2 to 3 liters oxygen, and is followed by Dr. Melvyn Novas.  In ER his SpO2 was in 70's and he required Bipap therapy.  ABG showed acute on chronic hypoxia/hypercapnia.  PCCM asked to assist with respiratory management.  Past medical history:  Diastolic CHF, DM type 2, HLD, HTN, Chronic pain, GERD, Melanoma, Insomnia, DVT, COVID pneumonia September 2021, Lt pneumothorax September 2021  Significant events:  6/05 Admit  Studies:  PFT 03/07/21 >> FEV1 1.32 (39%), FEV1% 92, TLC 2.13 (30%), DLCO 43% CT angio chest 05/06/22 >> 1.5 cm irregular nodule LUL, pulmonary fibrosis, basilar consolidation Doppler b/l legs 05/06/22 >> nonocclusive DVT Rt profunda femoris vein Echo 05/07/22 >> EF 55 to 60%, mod RV dysfx and enlargement, severe elevation in PASP, 38 mm ascending aorta  Micro:  COVID/Flu 6/05 >> negative  Lines:     Antibiotics:  Unasyn 6/05 >> Zithromax 6/05 >>   Consults:      Interim history:  Breathing better.  Not as much wheeze or sputum.  Walked some in his room.  Vital signs:  BP 112/63   Pulse 95   Temp (!) 97.4 F (36.3 C) (Oral)   Resp (!) 26   Ht '5\' 10"'$  (1.778 m)   Wt 85.9 kg   SpO2 91%   BMI 27.17 kg/m   Intake/output:  I/O last 3 completed shifts: In: 1250.1 [P.O.:600; IV Piggyback:650.1] Out: 6712 [Urine:4575]   Physical exam:   General - alert Eyes - pupils reactive ENT - no sinus tenderness, no stridor Cardiac - regular rate/rhythm, no murmur Chest - equal breath sounds b/l, no wheezing or rales Abdomen - soft, non tender, + bowel sounds Extremities - no cyanosis,  clubbing, or edema Skin - no rashes Neuro - normal strength, moves extremities, follows commands Psych - normal mood and behavior  Best practice:   DVT - Lovenox SUP - N/A Nutrition - carb mod/heart healthy    Assessment/plan:   Acute on chronic hypoxic/hypercapnic respiratory failure. - from bilateral lower lobe pneumonia in setting of post-COVID pulmonary fibrosis - goal SpO2 > 90% - will need to do ambulatory oximetry prior to discharge to determine optimal home oxygen set up - NIV apparently won't be covered by his insurance; will see if he can get home Bipap set up based on diagnosis of restrictive lung disease - day 4 of Abx - continue scheduled duoneb for now  Recurrent DVT. - continue eliquis - likely needs indefinite anticoagulation therapy  Pulmonary hypertension. - suspect he needs higher supplemental oxygen settings as outpt - he will need repeat Echo in few weeks after this hospitalization; if PA pressures still elevated, then might need RHC  Acute on chronic diastolic CHF. DM type 2. Depression/anxiety. - per primary team  Resolved hospital problems:    Goals of care/Family discussions:  Code status: full code  Updated his wife at bedside  Labs:      Latest Ref Rng & Units 05/09/2022    4:36 AM 05/08/2022    3:45 AM 05/07/2022    3:33 AM  CMP  Glucose 70 - 99 mg/dL 116  54  116   BUN 8 - 23 mg/dL 22  29  34   Creatinine 0.61 - 1.24 mg/dL 0.77  0.87  1.11   Sodium 135 - 145 mmol/L 141  143  145   Potassium 3.5 - 5.1 mmol/L 3.1  3.3  4.0   Chloride 98 - 111 mmol/L 94  95  101   CO2 22 - 32 mmol/L 37  38  32   Calcium 8.9 - 10.3 mg/dL 8.2  8.7  8.8   Total Protein 6.5 - 8.1 g/dL   6.7   Total Bilirubin 0.3 - 1.2 mg/dL   0.8   Alkaline Phos 38 - 126 U/L   49   AST 15 - 41 U/L   14   ALT 0 - 44 U/L   21        Latest Ref Rng & Units 05/09/2022    4:36 AM 05/08/2022    3:45 AM 05/07/2022    3:33 AM  CBC  WBC 4.0 - 10.5 K/uL 5.9  7.3  4.9    Hemoglobin 13.0 - 17.0 g/dL 12.7  13.2  13.0   Hematocrit 39.0 - 52.0 % 44.4  45.6  45.2   Platelets 150 - 400 K/uL 153  173  163     ABG    Component Value Date/Time   PHART 7.33 (L) 05/07/2022 0239   PCO2ART 84 (HH) 05/07/2022 0239   PO2ART 63 (L) 05/07/2022 0239   HCO3 44.3 (H) 05/07/2022 0239   TCO2 25 11/17/2021 0431   ACIDBASEDEF 1.1 04/09/2021 2012   O2SAT 93.9 05/07/2022 0239    CBG (last 3)  Recent Labs    05/09/22 0116 05/09/22 0736 05/09/22 1120  GLUCAP 106* 118* 181*    Signature:  Chesley Mires, MD [place] Pager - [phone] 05/09/2022, 2:15 PM

## 2022-05-09 NOTE — Progress Notes (Signed)
Pharmacy Antibiotic Note  Tim Day is a 71 y.o. male admitted on 05/06/2022 with  aspiration pneumonia .  Pharmacy has been consulted for Unasyn dosing.  Plan: Continue Unasyn 3000 mg IV every 6 hours. F/U transition to PO antibiotics  Height: '5\' 10"'$  (177.8 cm) Weight: 85.9 kg (189 lb 6 oz) IBW/kg (Calculated) : 73  Temp (24hrs), Avg:97.6 F (36.4 C), Min:97.3 F (36.3 C), Max:97.9 F (36.6 C)  Recent Labs  Lab 05/06/22 1032 05/06/22 1154 05/07/22 0333 05/08/22 0345 05/09/22 0436  WBC 5.6  --  4.9 7.3 5.9  CREATININE  --  0.89 1.11 0.87 0.77     Estimated Creatinine Clearance: 91.3 mL/min (by C-G formula based on SCr of 0.77 mg/dL).    No Known Allergies  Antimicrobials this admission: Unasyn 6/5 >> Azith 6/5 >>     Thank you for allowing pharmacy to be a part of this patient's care.  Ramond Craver 05/09/2022 3:01 PM

## 2022-05-10 DIAGNOSIS — J189 Pneumonia, unspecified organism: Secondary | ICD-10-CM | POA: Diagnosis not present

## 2022-05-10 DIAGNOSIS — J9601 Acute respiratory failure with hypoxia: Secondary | ICD-10-CM | POA: Diagnosis not present

## 2022-05-10 DIAGNOSIS — J9621 Acute and chronic respiratory failure with hypoxia: Secondary | ICD-10-CM | POA: Diagnosis not present

## 2022-05-10 DIAGNOSIS — J9622 Acute and chronic respiratory failure with hypercapnia: Secondary | ICD-10-CM | POA: Diagnosis not present

## 2022-05-10 DIAGNOSIS — J9602 Acute respiratory failure with hypercapnia: Secondary | ICD-10-CM | POA: Diagnosis not present

## 2022-05-10 LAB — BASIC METABOLIC PANEL
Anion gap: 8 (ref 5–15)
BUN: 20 mg/dL (ref 8–23)
CO2: 37 mmol/L — ABNORMAL HIGH (ref 22–32)
Calcium: 8.6 mg/dL — ABNORMAL LOW (ref 8.9–10.3)
Chloride: 97 mmol/L — ABNORMAL LOW (ref 98–111)
Creatinine, Ser: 0.73 mg/dL (ref 0.61–1.24)
GFR, Estimated: >60 mL/min (ref >60)
Glucose, Bld: 119 mg/dL — ABNORMAL HIGH (ref 70–99)
Potassium: 3.9 mmol/L (ref 3.5–5.1)
Sodium: 142 mmol/L (ref 135–145)

## 2022-05-10 LAB — GLUCOSE, CAPILLARY
Glucose-Capillary: 126 mg/dL — ABNORMAL HIGH (ref 70–99)
Glucose-Capillary: 133 mg/dL — ABNORMAL HIGH (ref 70–99)
Glucose-Capillary: 162 mg/dL — ABNORMAL HIGH (ref 70–99)
Glucose-Capillary: 165 mg/dL — ABNORMAL HIGH (ref 70–99)
Glucose-Capillary: 223 mg/dL — ABNORMAL HIGH (ref 70–99)
Glucose-Capillary: 254 mg/dL — ABNORMAL HIGH (ref 70–99)

## 2022-05-10 LAB — MAGNESIUM: Magnesium: 2.2 mg/dL (ref 1.7–2.4)

## 2022-05-10 MED ORDER — AMOXICILLIN-POT CLAVULANATE 875-125 MG PO TABS
1.0000 | ORAL_TABLET | Freq: Two times a day (BID) | ORAL | Status: DC
  Administered 2022-05-10 – 2022-05-11 (×3): 1 via ORAL
  Filled 2022-05-10 (×4): qty 1

## 2022-05-10 MED ORDER — NYSTATIN 100000 UNIT/GM EX POWD
Freq: Two times a day (BID) | CUTANEOUS | Status: DC
  Filled 2022-05-10: qty 15

## 2022-05-10 NOTE — Progress Notes (Signed)
PROGRESS NOTE    Tim Day  YFV:[id number] DOB: 09/07/1951 DOA: 05/06/2022 PCP: Celene Squibb, MD   Brief Narrative:    Tim Day is a 71 y.o. male with medical history significant of chronic hypoxic respiratory failure on 2-3 L of oxygen at home, chronic HFpEF, DM-2, HTN, HLD presented to the hospital from home with shortness of breath x2 days.  Patient was admitted with acute on chronic hypoxemic and hypercarbic respiratory failure in the setting of bilateral lower lobe pneumonia with suspicion of aspiration along with acute on chronic diastolic CHF exacerbation.  He has been started on IV antibiotics and IV Lasix.  Pulmonology consulted for assistance in management given BiPAP use and pulmonary fibrosis.  Assessment & Plan:   Principal Problem:   Respiratory failure with hypoxia and hypercapnia (HCC) Active Problems:   Acute on chronic respiratory failure with hypoxia (HCC)  Assessment and Plan:   Acute on chronic hypoxic and hypercarbic respiratory failure (2-3 L of oxygen at baseline):  -Appears to be multifactorial-from decompensated diastolic heart failure, possible aspiration pneumonia. -Appreciate pulmonology assistance and recommendation -Continue to use BiPAP nightly -Continue diuresis and current oral antibiotics. -Will continue current bronchodilator management and wean off oxygen supplementation towards his baseline as tolerated. -Currently using 3 L nasal cannula supplementation at rest. -Desaturation screening requested.  Acute on chronic HFpEF: Appears volume overloaded - trace to 1 + pitting edema appreciated on exam. -continue IV Lasix with intentions to keep in negative balance.  He appears to be diuresing well  -Continue to follow daily weights, strict I's and O's and low-sodium diet.  -2D echocardiogram with LVEF 55-60% and no other acute abnormalities. -Outpatient follow-up to repeat echocardiogram and if still elevated pulmonary artery  pressures will require right heart cath. -REDsClipp order in am to follow stability.   Possible aspiration pneumonia:  -Continue treatment with Unasyn -Patient completed 3 days of Zithromax as well -Speech therapy has recommended regular diet with thin liquids. -Continue supportive care.     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   -Oxygen supplementation 3 L at rest. -Will check desaturation screening for adjustment on oxygen supplementation with exertion. -Per history-patient apparently was hospitalized for 17 days with COVID-19-and has required oxygen since then.   -Appreciate pulmonology evaluation and will follow recommendations. -Home BiPAP forms has been signed and requested; patient will benefit of the use of BiPAP nightly.  While inpatient so far tolerating BiPAP at nighttime. -Continue to wean down oxygen supplementation to his baseline; O2 sats goal 90%.   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  VTE was related to COVID-19 infection.  -CTA chest negative-will be on prophylactic Lovenox.  -Ultrasound performed 6/5 positive for right lower extremity DVT.   -Restarted on Eliquis; anticipating the need for long-term anticoagulation therapy..   DM-2:  -Continue holding oral hypoglycemic agents while inpatient -Continue to follow CBGs and further adjust hypoglycemic regimen as required. -Continue sliding scale insulin while inpatient.   HTN:  -BP currently appears stable -Continue heart healthy/low-sodium diet. -Lifestyle changes discussed with patient. -Will follow vital signs and resume home antihypertensive agents as needed.   Depression/anxiety:  -Continue holding psych medications; with sensations to resume at time of discharge at adjusted dose. -No hallucinations or psychosis. -Stable mood.    History of lung nodule:  -Continue outpatient follow-up with Dr. Melvyn Novas.  DVT prophylaxis: Full dose Lovenox Code Status: Full Family Communication: Wife at bedside 6/7 Disposition Plan:  Status is: Inpatient  Remains inpatient appropriate because: Need for IV medications.  Consultants:  PCCM.  Procedures:  2D echocardiogram 6/6  Antimicrobials:  Anti-infectives (From admission, onward)    Start     Dose/Rate Route Frequency Ordered Stop   05/10/22 1200  amoxicillin-clavulanate (AUGMENTIN) 875-125 MG per tablet 1 tablet        1 tablet Oral Every 12 hours 05/10/22 1034     05/07/22 1345  azithromycin (ZITHROMAX) 500 mg in sodium chloride 0.9 % 250 mL IVPB        500 mg 250 mL/hr over 60 Minutes Intravenous Every 24 hours 05/06/22 1553 05/09/22 1544   05/06/22 2000  Ampicillin-Sulbactam (UNASYN) 3 g in sodium chloride 0.9 % 100 mL IVPB  Status:  Discontinued        3 g 200 mL/hr over 30 Minutes Intravenous Every 6 hours 05/06/22 1559 05/10/22 1034   05/06/22 1700  cefTRIAXone (ROCEPHIN) 1 g in sodium chloride 0.9 % 100 mL IVPB  Status:  Discontinued        1 g 200 mL/hr over 30 Minutes Intravenous Every 24 hours 05/06/22 1445 05/06/22 1553   05/06/22 1500  azithromycin (ZITHROMAX) 500 mg in sodium chloride 0.9 % 250 mL IVPB  Status:  Discontinued        500 mg 250 mL/hr over 60 Minutes Intravenous Every 24 hours 05/06/22 1445 05/06/22 1449   05/06/22 1345  Ampicillin-Sulbactam (UNASYN) 3 g in sodium chloride 0.9 % 100 mL IVPB        3 g 200 mL/hr over 30 Minutes Intravenous  Once 05/06/22 1342 05/06/22 1451   05/06/22 1345  azithromycin (ZITHROMAX) 500 mg in sodium chloride 0.9 % 250 mL IVPB  Status:  Discontinued        500 mg 250 mL/hr over 60 Minutes Intravenous Every 24 hours 05/06/22 1343 05/06/22 1553       Subjective: No fever, no chest pain, no nausea, no vomiting.  Reports breathing better.  Intermittent nonproductive coughing spells appreciated.  Tolerating  BiPAP at nighttime and currently using 3 L nasal cannula supplementation at rest.  Objective: Vitals:   05/10/22 0500 05/10/22 0834 05/10/22 0844 05/10/22 1454  BP: 113/68  117/69   Pulse: 80  87   Resp:   18   Temp:   97.6 F (36.4 C)   TempSrc:   Oral   SpO2: 98% 96% 99% 94%  Weight: 86.6 kg  84.3 kg   Height:        Intake/Output Summary (Last 24 hours) at 05/10/2022 1725 Last data filed at 05/10/2022 1300 Gross per 24 hour  Intake 480 ml  Output 1250 ml  Net -770 ml   Filed Weights   05/08/22 0332 05/10/22 0500 05/10/22 0844  Weight: 85.9 kg 86.6 kg 84.3 kg    Examination: General exam: Alert, awake, oriented x 3; reports feeling better, no chest pain, no nausea, no vomiting and having good saturation at rest on 3 L nasal cannula supplementation. Respiratory system: Decreased breath sounds at the bases, mild expiratory wheezing and positive rhonchi appreciated bilaterally; no using accessory muscle.  Patient tolerated using BiPAP at nighttime. Cardiovascular system:RRR. No murmurs, rubs, gallops.  No JVD. Gastrointestinal system: Abdomen is nondistended, soft and nontender. No organomegaly or masses felt. Normal bowel sounds heard. Central nervous system: Alert and oriented. No focal neurological deficits. Extremities: No cyanosis or clubbing;  no edema appreciated. Skin: No petechiae. Psychiatry: Judgement and insight appear normal. Mood & affect appropriate.   Data Reviewed: I have personally reviewed following labs and imaging studies  CBC: Recent Labs  Lab 05/06/22 1032 05/07/22 0333 05/08/22 0345 05/09/22 0436  WBC 5.6 4.9 7.3 5.9  NEUTROABS 4.5  --   --   --   HGB 13.8 13.0 13.2 12.7*  HCT 47.8 45.2 45.6 44.4  MCV 90.5 90.6 89.9 89.5  PLT 149* 163 173 413   Basic Metabolic Panel: Recent Labs  Lab 05/06/22 1154 05/07/22 0333 05/08/22 0345 05/09/22 0436 05/10/22 0354  NA 137 145 143 141 142  K 4.3 4.0 3.3* 3.1* 3.9  CL 100 101 95* 94* 97*  CO2 30 32  38* 37* 37*  GLUCOSE 155* 116* 54* 116* 119*  BUN 33* 34* 29* 22 20  CREATININE 0.89 1.11 0.87 0.77 0.73  CALCIUM 8.7* 8.8* 8.7* 8.2* 8.6*  MG 2.0  --  2.0  --  2.2   GFR: Estimated Creatinine Clearance: 91.3 mL/min (by C-G formula based on SCr of 0.73 mg/dL).  Liver Function Tests: Recent Labs  Lab 05/06/22 1154 05/07/22 0333  AST 19 14*  ALT 23 21  ALKPHOS 49 49  BILITOT 0.8 0.8  PROT 6.8 6.7  ALBUMIN 3.5 3.3*   Coagulation Profile: Recent Labs  Lab 05/06/22 2148  INR 1.3*   CBG: Recent Labs  Lab 05/10/22 0030 05/10/22 0527 05/10/22 0750 05/10/22 1106 05/10/22 1618  GLUCAP 162* 126* 133* 165* 223*   Sepsis Labs: Recent Labs  Lab 05/06/22 1154  PROCALCITON <0.10    Recent Results (from the past 240 hour(s))  Resp Panel by RT-PCR (Flu A&B, Covid) Anterior Nasal Swab     Status: None   Collection Time: 05/06/22 10:32 AM   Specimen: Anterior Nasal Swab  Result Value Ref Range Status   SARS Coronavirus 2 by RT PCR NEGATIVE NEGATIVE Final    Comment: (NOTE) SARS-CoV-2 target nucleic acids are NOT DETECTED.  The SARS-CoV-2 RNA is generally detectable in upper respiratory specimens during the acute phase of infection. The lowest concentration of SARS-CoV-2 viral copies this assay can detect is 138 copies/mL. A negative result does not preclude SARS-Cov-2 infection and should not be used as the sole basis for treatment or other patient management decisions. A negative result may occur with  improper specimen collection/handling, submission of specimen other than nasopharyngeal swab, presence of viral mutation(s) within the areas targeted by this assay, and inadequate number of viral copies(<138 copies/mL). A negative result must be combined with clinical observations, patient history, and epidemiological information. The expected result is Negative.  Fact Sheet for Patients:  EntrepreneurPulse.com.au  Fact Sheet for Healthcare Providers:   IncredibleEmployment.be  This test is no t yet approved or cleared by the [place] FDA and  has been authorized for detection and/or diagnosis of SARS-CoV-2 by FDA under an Emergency Use Authorization (EUA). This EUA will remain  in effect (meaning this test can be used) for the duration of the COVID-19 declaration under Section 564(b)(1) of the Act, 21 U.S.C.section 360bbb-3(b)(1), unless the authorization is terminated  or revoked sooner.       Influenza A by PCR NEGATIVE NEGATIVE Final   Influenza B by PCR NEGATIVE NEGATIVE Final    Comment: (NOTE) The Xpert Xpress SARS-CoV-2/FLU/RSV plus assay is intended as an aid in the diagnosis of influenza from Nasopharyngeal swab specimens and should not be used as a sole basis for treatment. Nasal  washings and aspirates are unacceptable for Xpert Xpress SARS-CoV-2/FLU/RSV testing.  Fact Sheet for Patients: EntrepreneurPulse.com.au  Fact Sheet for Healthcare Providers: IncredibleEmployment.be  This test is not yet approved or cleared by the [place] FDA and has been authorized for detection and/or diagnosis of SARS-CoV-2 by FDA under an Emergency Use Authorization (EUA). This EUA will remain in effect (meaning this test can be used) for the duration of the COVID-19 declaration under Section 564(b)(1) of the Act, 21 U.S.C. section 360bbb-3(b)(1), unless the authorization is terminated or revoked.  Performed at [facility], [address] [place], [place] [address]   MRSA Next Gen by PCR, Nasal     Status: Abnormal   Collection Time: 05/06/22  3:31 PM   Specimen: Nasal Mucosa; Nasal Swab  Result Value Ref Range Status   MRSA by PCR Next Gen DETECTED (A) NOT DETECTED Final    Comment: RCRV K EDWARDS AT 1720 ON 05.05.23 BY ADGER J         The GeneXpert MRSA Assay (FDA approved for NASAL specimens only), is one component of a comprehensive MRSA  colonization surveillance program. It is not intended to diagnose MRSA infection nor to guide or monitor treatment for MRSA infections. Performed at [facility], [address] [place], [place] [address]     Radiology Studies: No results found.  Scheduled Meds:  amoxicillin-clavulanate  1 tablet Oral Q12H   apixaban  5 mg Oral BID   chlorhexidine  15 mL Mouth Rinse BID   Chlorhexidine Gluconate Cloth  6 each Topical Q0600   furosemide  40 mg Intravenous Q12H   hydrocortisone cream   Topical TID   insulin aspart  0-9 Units Subcutaneous Q4H   insulin glargine-yfgn  10 Units Subcutaneous q AM   ipratropium-albuterol  3 mL Nebulization TID   mouth rinse  15 mL Mouth Rinse q12n4p   mupirocin ointment  1 application  Nasal BID   sodium chloride flush  3 mL Intravenous Q12H   Continuous Infusions:  sodium chloride       LOS: 4 days   Barton Dubois, MD [facility]  If 7PM-7AM, please contact night-coverage [email or web address] 05/10/2022, 5:25 PM

## 2022-05-10 NOTE — Progress Notes (Signed)
[facility]   Patient name: Tim Day Admit date: 05/06/2022  DOB: 11/02/1951 LOS: 4  MRN: [id number] Consult date: 05/07/2022  Referring provider: Dr. Manuella Ghazi, [facility]: Dyspnea    History:  71 yo male presented to [facility] with 2 days of worsening dyspnea, cough and leg swelling.  He has hx of COVID with pulmonary fibrosis and uses 2 to 3 liters oxygen, and is followed by Dr. Melvyn Novas.  In ER his SpO2 was in 70's and he required Bipap therapy.  ABG showed acute on chronic hypoxia/hypercapnia.  PCCM asked to assist with respiratory management.  Past medical history:  Diastolic CHF, DM type 2, HLD, HTN, Chronic pain, GERD, Melanoma, Insomnia, DVT, COVID pneumonia September 2021, Lt pneumothorax September 2021  Significant events:  6/05 Admit  Studies:  PFT 03/07/21 >> FEV1 1.32 (39%), FEV1% 92, TLC 2.13 (30%), DLCO 43% CT angio chest 05/06/22 >> 1.5 cm irregular nodule LUL, pulmonary fibrosis, basilar consolidation Doppler b/l legs 05/06/22 >> nonocclusive DVT Rt profunda femoris vein Echo 05/07/22 >> EF 55 to 60%, mod RV dysfx and enlargement, severe elevation in PASP, 38 mm ascending aorta  Micro:  COVID/Flu 6/05 >> negative  Lines:     Antibiotics:  Unasyn 6/05 >> Zithromax 6/05 >>   Consults:      Interim history:  Moved upstairs.  Breathing better, and getting around easier.  Feels that nebulizer treatments help.  Vital signs:  BP 117/69 (BP Location: Right Arm)   Pulse 87   Temp 97.6 F (36.4 C) (Oral)   Resp 18   Ht '5\' 10"'$  (1.778 m)   Wt 84.3 kg   SpO2 99%   BMI 26.67 kg/m   Intake/output:  I/O last 3 completed shifts: In: 1370.1 [P.O.:720; IV Piggyback:650.1] Out: 4098 [Urine:3650]   Physical exam:   General - alert Eyes - pupils reactive ENT - no sinus tenderness, no stridor Cardiac - regular rate/rhythm, no murmur Chest - equal breath sounds b/l, no wheezing or rales Abdomen - soft, non tender,  + bowel sounds Extremities - no cyanosis, clubbing, or edema Skin - no rashes Neuro - normal strength, moves extremities, follows commands Psych - normal mood and behavior  Best practice:   DVT - Lovenox SUP - N/A Nutrition - carb mod/heart healthy    Assessment/plan:   Acute on chronic hypoxic/hypercapnic respiratory failure. - from bilateral lower lobe pneumonia in setting of post-COVID pulmonary fibrosis - goal SpO2 > 90% - will need to do ambulatory oximetry prior to discharge to determine optimal home oxygen set up - case manager working on getting Bipap set up - day 5 of ABx - he will need to get home nebulizer set up, and can use duoneb q6h prn  Recurrent DVT. - continue eliquis - likely needs indefinite anticoagulation therapy  Pulmonary hypertension. - suspect he needs higher supplemental oxygen settings as outpt - he will need repeat Echo in few weeks after this hospitalization; if PA pressures still elevated, then might need RHC  Acute on chronic diastolic CHF. DM type 2. Depression/anxiety. - per primary team  He has pulmonary office visit scheduled with Dr. Melvyn Novas on 06/06/22.  Okay for d/c home from pulmonary standpoint once he gets Bipap set up.  Please call if additional help needed while he is in hospital.  Resolved hospital problems:    Goals of care/Family discussions:  Code status: full code  Updated his wife at bedside  Labs:      Latest  Ref Rng & Units 05/10/2022    3:54 AM 05/09/2022    4:36 AM 05/08/2022    3:45 AM  CMP  Glucose 70 - 99 mg/dL 119  116  54   BUN 8 - 23 mg/dL '20  22  29   '$ Creatinine 0.61 - 1.24 mg/dL 0.73  0.77  0.87   Sodium 135 - 145 mmol/L 142  141  143   Potassium 3.5 - 5.1 mmol/L 3.9  3.1  3.3   Chloride 98 - 111 mmol/L 97  94  95   CO2 22 - 32 mmol/L 37  37  38   Calcium 8.9 - 10.3 mg/dL 8.6  8.2  8.7        Latest Ref Rng & Units 05/09/2022    4:36 AM 05/08/2022    3:45 AM 05/07/2022    3:33 AM  CBC  WBC 4.0 - 10.5  K/uL 5.9  7.3  4.9   Hemoglobin 13.0 - 17.0 g/dL 12.7  13.2  13.0   Hematocrit 39.0 - 52.0 % 44.4  45.6  45.2   Platelets 150 - 400 K/uL 153  173  163     ABG    Component Value Date/Time   PHART 7.33 (L) 05/07/2022 0239   PCO2ART 84 (HH) 05/07/2022 0239   PO2ART 63 (L) 05/07/2022 0239   HCO3 44.3 (H) 05/07/2022 0239   TCO2 25 11/17/2021 0431   ACIDBASEDEF 1.1 04/09/2021 2012   O2SAT 93.9 05/07/2022 0239    CBG (last 3)  Recent Labs    05/10/22 0527 05/10/22 0750 05/10/22 1106  GLUCAP 126* 133* 165*    Signature:  Chesley Mires, MD [facility] Pager - [phone] 05/10/2022, 2:18 PM

## 2022-05-10 NOTE — TOC Progression Note (Signed)
Transition of Care [facility]) - Progression Note    Patient Details  Name: Tim Day MRN: [id number] Date of Birth: [birth year]  Transition of Care [facility]) CM/SW Contact  Ihor Gully, LCSW Phone Number: 05/10/2022, 12:59 PM  Clinical Narrative:    Tim Day has submitted for authorization for Bipap to [employer].    Expected Discharge Plan: [place] Barriers to Discharge: Continued Medical Work up  Expected Discharge Plan and Services Expected Discharge Plan: [place] In-house Referral: Clinical Social Work   Post Acute Care Choice: [place] arrangements for the past 2 months: Single Family Home                                       Social Determinants of Health (SDOH) Interventions    Readmission Risk Interventions     No data to display

## 2022-05-10 NOTE — Progress Notes (Signed)
Physical Therapy Treatment Patient Details Name: Tim Day MRN: [id number] DOB: 1951-10-24 Today's Date: 05/10/2022   History of Present Illness Tim Day is a 71 y.o. male with medical history significant of chronic hypoxic respiratory failure on 2-3 L of oxygen at home, chronic HFpEF, DM-2, HTN, HLD presented to the hospital from home with shortness of breath x2 days.  Please note-history is limited as patient is on BiPAP-most of this history is obtained after talking with the patient briefly,spouse and by chart review.  Apparently-for the past 2 days or so-patient had shortness of breath-and worsening lower extremity swelling.  He also had a productive cough.  Family noted worsening leg swelling for the past 4 days-and started giving him his as needed Lasix dose more frequently.  Family bumped up his oxygen to 4 L to alleviate his shortness of breath.  Per history-his shortness of breath is mostly worse with exertion.  Due to worsening shortness of breath- EMS was called-apparently his O2 saturations was in the 70s on 4 L-he was subsequently placed on a nonrebreather mask-and brought to the ED.  In the ED-he was placed on BiPAP.  TRH was asked to consult for further evaluation and treatment    PT Comments    Patient presents standing at bedside, on 5 LPM O2 and agreeable for therapy, his spouse present in room.  Patient demonstrates good return for completing BLE ROM/strengthening exercises while on 2 LPM O2 with SpO2 at 96%, declined to attempt bed mobility, stating that he sleeps in recliner at home, increased endurance distance for gait training without use of AD and no loss of balance and limited mostly due to SOB with SpO2 dropping from 95% to 77% on 2 LPM and increased to 3 LPM, required standing rest break before returning to room with SpO2 at 87%.  Patient tolerated staying up in chair after therapy with SpO2 at 96% while on 3 LPM - RN aware.  Plan:  Patient discharged from  physical therapy to care of nursing for ambulation daily as tolerated for length of stay.     Recommendations for follow up therapy are one component of a multi-disciplinary discharge planning process, led by the attending physician.  Recommendations may be updated based on patient status, additional functional criteria and insurance authorization.  Follow Up Recommendations  Home health PT     Assistance Recommended at Discharge PRN  Patient can return home with the following A little help with walking and/or transfers;Help with stairs or ramp for entrance;A little help with bathing/dressing/bathroom;Assistance with cooking/housework   Equipment Recommendations  None recommended by PT    Recommendations for Other Services       Precautions / Restrictions Precautions Precautions: Fall Restrictions Weight Bearing Restrictions: No     Mobility  Bed Mobility               General bed mobility comments: Patient presents in chair and declined bed mobility training due to sleeping in lounge chair at home    Transfers Overall transfer level: Modified independent                 General transfer comment: demonstrates good return for transfers without use of AD    Ambulation/Gait Ambulation/Gait assistance: Modified independent (Device/Increase time), Supervision Gait Distance (Feet): 100 Feet Assistive device: None Gait Pattern/deviations: Decreased step length - right, Decreased step length - left, Decreased stride length Gait velocity: decreased     General Gait Details: slightly labored cadence without use  of AD with good return for ambulating in room and hallway without loss of balance, required 1 standing rest break due to SOB, on 2 LPM with SpO2 dropping from 95% to 77%, had to raise to 3 LPM for returning to room   Stairs             Wheelchair Mobility    Modified Rankin (Stroke Patients Only)       Balance Overall balance assessment: No  apparent balance deficits (not formally assessed)             Standing balance comment: demonstrates good return for standing and ambulating without need for an AD                            Cognition Arousal/Alertness: Awake/alert Behavior During Therapy: WFL for tasks assessed/performed Overall Cognitive Status: Within Functional Limits for tasks assessed                                          Exercises General Exercises - Lower Extremity Long Arc Quad: Seated, AROM, Strengthening, Both, 10 reps Hip Flexion/Marching: Seated, AROM, Strengthening, Both, 10 reps Toe Raises: Seated, AROM, Strengthening, Both, 10 reps Heel Raises: Seated, AROM, Strengthening, Both, 10 reps    General Comments        Pertinent Vitals/Pain Pain Assessment Pain Assessment: No/denies pain    Home Living                          Prior Function            PT Goals (current goals can now be found in the care plan section) Acute Rehab PT Goals Patient Stated Goal: return home PT Goal Formulation: With patient Time For Goal Achievement: 05/10/22 Potential to Achieve Goals: Good Progress towards PT goals: Goals met/education completed, patient discharged from PT    Frequency           PT Plan Other (comment) (Patient to discharged to nursing staff for ambulation daily as tolerated)    Co-evaluation              AM-PAC PT "6 Clicks" Mobility   Outcome Measure  Help needed turning from your back to your side while in a flat bed without using bedrails?: A Little Help needed moving from lying on your back to sitting on the side of a flat bed without using bedrails?: A Little Help needed moving to and from a bed to a chair (including a wheelchair)?: None Help needed standing up from a chair using your arms (e.g., wheelchair or bedside chair)?: None Help needed to walk in hospital room?: A Little Help needed climbing 3-5 steps with a  railing? : A Little 6 Click Score: 20    End of Session Equipment Utilized During Treatment: Oxygen Activity Tolerance: Patient tolerated treatment well;Patient limited by fatigue Patient left: in chair;with family/visitor present Nurse Communication: Mobility status       Time: 9024-0973 PT Time Calculation (min) (ACUTE ONLY): 26 min  Charges:  $Gait Training: 8-22 mins $Therapeutic Exercise: 8-22 mins                     12:25 PM, 05/10/22 Lonell Grandchild, MPT Physical Therapist with [facility] [phone] office [phone] mobile phone

## 2022-05-10 NOTE — Care Management Important Message (Signed)
Important Message  Patient Details  Name: Tim Day MRN: [id number] Date of Birth: 1951/02/26   Medicare Important Message Given:  Yes  Reviewed Medicare IM with Janey Genta, spouse, at [phone]. Copy of Medicare IM placed in mail to home address on file.     Dannette Barbara 05/10/2022, 9:43 AM

## 2022-05-11 DIAGNOSIS — J8410 Pulmonary fibrosis, unspecified: Secondary | ICD-10-CM | POA: Diagnosis not present

## 2022-05-11 DIAGNOSIS — Z794 Long term (current) use of insulin: Secondary | ICD-10-CM

## 2022-05-11 DIAGNOSIS — K219 Gastro-esophageal reflux disease without esophagitis: Secondary | ICD-10-CM

## 2022-05-11 DIAGNOSIS — I5033 Acute on chronic diastolic (congestive) heart failure: Secondary | ICD-10-CM

## 2022-05-11 DIAGNOSIS — J189 Pneumonia, unspecified organism: Secondary | ICD-10-CM | POA: Diagnosis not present

## 2022-05-11 DIAGNOSIS — J9621 Acute and chronic respiratory failure with hypoxia: Secondary | ICD-10-CM | POA: Diagnosis not present

## 2022-05-11 DIAGNOSIS — E119 Type 2 diabetes mellitus without complications: Secondary | ICD-10-CM

## 2022-05-11 LAB — BASIC METABOLIC PANEL
Anion gap: 4 — ABNORMAL LOW (ref 5–15)
BUN: 23 mg/dL (ref 8–23)
CO2: 41 mmol/L — ABNORMAL HIGH (ref 22–32)
Calcium: 8.1 mg/dL — ABNORMAL LOW (ref 8.9–10.3)
Chloride: 95 mmol/L — ABNORMAL LOW (ref 98–111)
Creatinine, Ser: 0.83 mg/dL (ref 0.61–1.24)
GFR, Estimated: >60 mL/min (ref >60)
Glucose, Bld: 145 mg/dL — ABNORMAL HIGH (ref 70–99)
Potassium: 3.7 mmol/L (ref 3.5–5.1)
Sodium: 140 mmol/L (ref 135–145)

## 2022-05-11 LAB — GLUCOSE, CAPILLARY
Glucose-Capillary: 131 mg/dL — ABNORMAL HIGH (ref 70–99)
Glucose-Capillary: 153 mg/dL — ABNORMAL HIGH (ref 70–99)
Glucose-Capillary: 157 mg/dL — ABNORMAL HIGH (ref 70–99)
Glucose-Capillary: 190 mg/dL — ABNORMAL HIGH (ref 70–99)

## 2022-05-11 LAB — CBC
HCT: 45.1 % (ref 39.0–52.0)
Hemoglobin: 12.8 g/dL — ABNORMAL LOW (ref 13.0–17.0)
MCH: 25.6 pg — ABNORMAL LOW (ref 26.0–34.0)
MCHC: 28.4 g/dL — ABNORMAL LOW (ref 30.0–36.0)
MCV: 90.2 fL (ref 80.0–100.0)
Platelets: 165 10*3/uL (ref 150–400)
RBC: 5 MIL/uL (ref 4.22–5.81)
RDW: 15.4 % (ref 11.5–15.5)
WBC: 5.9 10*3/uL (ref 4.0–10.5)
nRBC: 0 % (ref 0.0–0.2)

## 2022-05-11 MED ORDER — IPRATROPIUM-ALBUTEROL 0.5-2.5 (3) MG/3ML IN SOLN: 3.0000 mL | Freq: Four times a day (QID) | RESPIRATORY_TRACT | 2 refills | Status: DC | PRN

## 2022-05-11 MED ORDER — TOUJEO MAX SOLOSTAR 300 UNIT/ML ~~LOC~~ SOPN: 20.0000 [IU] | PEN_INJECTOR | Freq: Every morning | SUBCUTANEOUS | Status: DC

## 2022-05-11 MED ORDER — APIXABAN 5 MG PO TABS: 5.0000 mg | ORAL_TABLET | Freq: Two times a day (BID) | ORAL | 3 refills | Status: DC

## 2022-05-11 MED ORDER — AMOXICILLIN-POT CLAVULANATE 875-125 MG PO TABS
1.0000 | ORAL_TABLET | Freq: Two times a day (BID) | ORAL | 0 refills | Status: AC
Start: 2022-05-11 — End: 2022-05-16

## 2022-05-11 MED ORDER — NYSTATIN 100000 UNIT/GM EX POWD: Freq: Two times a day (BID) | CUTANEOUS | 0 refills | Status: DC

## 2022-05-11 MED ORDER — FUROSEMIDE 40 MG PO TABS: 40.0000 mg | ORAL_TABLET | Freq: Every day | ORAL | 3 refills | Status: DC

## 2022-05-11 NOTE — Progress Notes (Signed)
All LDAs removed.  Reviewed AVS with pt and wife, all questions answered, both verbalize understanding of instructions including medication changes and follow up, copy provided.  All belongings returned.  Pt taken via wheelchair to POV for transport home.

## 2022-05-11 NOTE — Progress Notes (Signed)
   05/11/22 1114  ReDS Vest / Clip  BMI (Calculated) 26.89  Station Marker C  Ruler Value 31  ReDS Value Range (!) > 40  ReDS Actual Value 44  Anatomical Comments pt sitting upright

## 2022-05-11 NOTE — Progress Notes (Addendum)
CSW spoke with Caryl Pina of [employer] who states the patient's insurance authorization is approved. [facility] will follow up with patient at home after discharge to initiate services.  Madilyn Fireman, MSW, LCSW Transitions of Care  Clinical Social Worker II [phone]

## 2022-05-11 NOTE — Discharge Summary (Signed)
Physician Discharge Summary   Patient: Tim Day MRN: [id number] DOB: [birth year]  Admit date:     05/06/2022  Discharge date: 05/11/22  Discharge Physician: Barton Dubois   PCP: Celene Squibb, MD   Recommendations at discharge:  Repeat basic metabolic panel to follow electrolytes and renal function Reassess blood pressure and adjust antihypertensive treatment as needed Follow patient response to the use of BiPAP and adjusted oxygen supplementation therapy. Make sure patient follow-up with pulmonologist as instructed.  Discharge Diagnoses: Principal Problem:   Respiratory failure with hypoxia and hypercapnia (HCC) Active Problems:   Pneumonia of both lower lobes due to infectious organism   Acute on chronic respiratory failure with hypoxia (HCC)   Pulmonary fibrosis (HCC)   Acute on chronic diastolic CHF (congestive heart failure) [name]   Hospital Course: As per H&P written by Dr. Sloan Leiter on 05/06/2022 Tim Day is a 71 y.o. male with medical history significant of chronic hypoxic respiratory failure on 2-3 L of oxygen at home, chronic HFpEF, DM-2, HTN, HLD presented to the hospital from home with shortness of breath x2 days.  Please note-history is limited as patient is on BiPAP-most of this history is obtained after talking with the patient briefly,spouse and by chart review.  Apparently-for the past 2 days or so-patient had shortness of breath-and worsening lower extremity swelling.  He also had a productive cough.  Family noted worsening leg swelling for the past 4 days-and started giving him his as needed Lasix dose more frequently.  Family bumped up his oxygen to 4 L to alleviate his shortness of breath.  Per history-his shortness of breath is mostly worse with exertion.  Due to worsening shortness of breath- EMS was called-apparently his O2 saturations was in the 70s on 4 L-he was subsequently placed on a nonrebreather mask-and brought to the ED.  In the ED-he was  placed on BiPAP.  TRH was asked to consult for further evaluation and treatment   No reported history of fever, chest pain, abdominal pain, nausea vomiting.  There is no history of diarrhea.  Assessment and Plan: Acute on chronic hypoxic and hypercarbic respiratory failure (2-3 L of oxygen at baseline):  -Appears to be multifactorial-from decompensated diastolic heart failure, possible aspiration pneumonia. -Appreciate pulmonology assistance and recommendation -Continue to use BiPAP nightly -Continue diuresis and current oral antibiotics. -Will continue current bronchodilator management and wean off oxygen supplementation towards his baseline as tolerated. -Currently using 3 L nasal cannula supplementation at rest and 4 L nasal cannula supplementation with activity.. -Following pulmonology recommendations prescription for DuoNeb as needed every 6 hours has been provided. -Patient will follow-up with Dr. Melvyn Novas as an outpatient.   Acute on chronic HFpEF:  -Volume status significantly improved -No crackles appreciated on examination; only trace edema appreciated in his lower extremities.  Patient reports breathing back to baseline. -Lasix has been transitioned to oral route with intention to use 40 mg on daily basis. -Low-sodium diet, adequate hydration and daily weights discussed with patient. - 2D echocardiogram with LVEF 55-60% and no other acute abnormalities. -Outpatient follow-up to repeat echocardiogram and if still elevated pulmonary artery pressures will require right heart cath. -REDsClipp ordered; and not exactly accurate. -Outpatient follow-up with cardiology service recommended.   Possible aspiration pneumonia:  -Treatment with the use of Unasyn and subsequent transition to oral Augmentin has been provided.  Afebrile and demonstrating overall improvement in his respiratory status appreciated prior to discharge. -Patient completed 3 days of Zithromax as well to cover for  any  atypical infection. -Speech therapy has recommended regular diet with thin liquids. -Continue supportive care.     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   -Oxygen supplementation 3 L at rest and 4 L nasal cannula supplementation required with exertion. -Will check desaturation screening for adjustment on oxygen supplementation with exertion. -Per history-patient apparently was hospitalized for 17 days with COVID-19-and has required oxygen since then.  -Appreciate pulmonology evaluation and will follow recommendations. -Home BiPAP forms has been signed and requested; patient will benefit of the use of BiPAP nightly.  While inpatient so far tolerating BiPAP at nighttime. -Continue to follow-up with pulmonologist as an outpatient to further adjust medication management and oxygen supplementation.   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  VTE was related to COVID-19 infection.  -CTA chest negative-will be on prophylactic Lovenox.  -Ultrasound performed 6/5 positive for right lower extremity DVT.   -Restarted on Eliquis; anticipating the need for long-term anticoagulation therapy. -Aspirin has been discontinued now that he will be taking Eliquis on daily basis.   DM-2:  -Resume home hypoglycemic regimen -Continue to follow CBGs and further adjust management as needed. -Blood sugar was stable and well controlled.   HTN:  -BP currently appears stable -Continue heart healthy/low-sodium diet. -Lifestyle changes discussed with patient. -Will reassessment of patient blood pressure with further adjustment to antihypertensive regimen as required.   Depression/anxiety:  -Resume home psychiatric medications regimen. -No hallucinations or psychosis. -Stable mood.   History of lung nodule:  -Continue outpatient follow-up  with Dr. Melvyn Novas.    Consultants: Pulmonology Procedures performed: See below for x-ray reports; 2D echo. Disposition: home. Diet recommendation: Heart healthy/low-sodium and modified carbohydrates.  DISCHARGE MEDICATION: Allergies as of 05/11/2022   No Known Allergies      Medication List     STOP taking these medications    aspirin EC 81 MG tablet   doxycycline 100 MG capsule Commonly known as: VIBRAMYCIN   losartan 25 MG tablet Commonly known as: COZAAR       TAKE these medications    Accu-Chek Guide test strip Generic drug: glucose blood USE TO TEST TWICE DAILY.D   acetaminophen 500 MG tablet Commonly known as: TYLENOL Take 1,000 mg by mouth 2 (two) times daily as needed for moderate pain.   alclomethasone 0.05 % cream Commonly known as: ACLOVATE Apply topically 2 (two) times daily as needed (Rash).   ALPRAZolam 0.25 MG tablet Commonly known as: XANAX Take 0.25 mg by mouth daily as needed for anxiety.   amoxicillin-clavulanate 875-125 MG tablet Commonly known as: AUGMENTIN Take 1 tablet by mouth every 12 (twelve) hours for 5 days.   apixaban 5 MG Tabs tablet Commonly known as: ELIQUIS Take 1 tablet (5 mg total) by mouth 2 (two) times daily.   ascorbic acid 500 MG tablet Commonly known as: VITAMIN C Take 1 tablet (500 mg total) by mouth daily.   blood glucose meter kit and supplies Kit Dispense based on patient and insurance preference. Use up to four times daily as directed. (FOR ICD-9 250.00, 250.01).   buPROPion 150 MG 24 hr tablet Commonly known as: WELLBUTRIN XL Take 150 mg by mouth daily.   co-enzyme Q-10 50 MG capsule Take 50 mg by mouth 2 (two) times daily.   famotidine 20 MG tablet Commonly known as: PEPCID TAKE ONE TABLET BY MOUTH DAILY AFTER SUPPER What  changed: See the new instructions.   Fiasp FlexTouch 100 UNIT/ML FlexTouch Pen Generic drug: insulin aspart Inject 2-8 Units into the skin every evening.   furosemide 40 MG  tablet Commonly known as: LASIX Take 1 tablet (40 mg total) by mouth daily. What changed:  medication strength how much to take when to take this reasons to take this   ipratropium-albuterol 0.5-2.5 (3) MG/3ML Soln Commonly known as: DUONEB Take 3 mLs by nebulization every 6 (six) hours as needed (SOB/dizziness.).   JARDIANCE PO Take 25 mg by mouth.   ketoconazole 2 % shampoo Commonly known as: NIZORAL Apply topically.   metFORMIN 1000 MG tablet Commonly known as: GLUCOPHAGE Take 1,000 mg by mouth 2 (two) times daily with a meal.   multivitamin capsule Take 1 capsule by mouth daily.   nystatin powder Commonly known as: MYCOSTATIN/NYSTOP Apply topically 2 (two) times daily.   OneTouch Delica Plus [id number] Misc Apply topically 2 (two) times daily.   pantoprazole 40 MG tablet Commonly known as: PROTONIX Take 40 mg by mouth daily.   Pen Needles 32G X 4 MM Misc 1 Package by Does not apply route 4 (four) times daily -  before meals and at bedtime.   POTASSIUM PO Take 1 tablet by mouth daily as needed (along with lasix).   rosuvastatin 5 MG tablet Commonly known as: CRESTOR Take 5 mg by mouth daily.   sertraline 50 MG tablet Commonly known as: ZOLOFT Take 100 mg by mouth daily.   SINEX REGULAR NA Place 1 spray into the nose daily as needed (congestion).   tamsulosin 0.4 MG Caps capsule Commonly known as: FLOMAX Take 0.4 mg by mouth at bedtime.   Toujeo Max SoloStar 300 UNIT/ML Solostar Pen Generic drug: insulin glargine (2 Unit Dial) Inject 20 Units into the skin in the morning. What changed: how much to take   Vitamin D3 125 MCG (5000 UT) Caps Take 5,000 Units by mouth daily.   zinc sulfate 220 (50 Zn) MG capsule Take 1 capsule (220 mg total) by mouth daily.               Durable Medical Equipment  (From admission, onward)           Start     Ordered   05/11/22 0000  For home use only DME Nebulizer machine       Question Answer Comment   Patient needs a nebulizer to treat with the following condition Pulmonary fibrosis ([place])   Length of Need 12 Months      05/11/22 1402            Follow-up Information     Celene Squibb, MD. Schedule an appointment as soon as possible for a visit in 46 day(s).   Specialty: Internal Medicine Contact information: [facility] [address] [phone]         Minus Breeding, MD .   Specialty: Cardiology Contact information: [address] [place] [address] [phone]                Discharge Exam: Filed Weights   05/10/22 0500 05/10/22 0844 05/11/22 1114  Weight: 86.6 kg 84.3 kg 85 kg   General exam: Alert, awake, oriented x 3; speaking in full sentences, reporting breathing back to baseline and demonstrating good saturation on 3 L.  Patient feels ready to go home. Respiratory system: Positive scattered rhonchi, no crackles, no wheezing on today's examination.  Moving Air well bilaterally  and no using accessory muscles. Cardiovascular system:RRR. No murmurs, rubs, gallops. Gastrointestinal system: Abdomen is nondistended, soft and nontender. No organomegaly or masses felt. Normal bowel sounds heard. Central nervous system: Alert and oriented. No focal neurological deficits. Extremities: No cyanosis or clubbing; trace edema appreciated bilaterally. Skin: No petechiae. Psychiatry: Judgement and insight appear normal. Mood & affect appropriate.    Condition at discharge: Stable and improved.  The results of significant diagnostics from this hospitalization (including imaging, microbiology, ancillary and laboratory) are listed below for reference.   Imaging Studies: DG CHEST PORT 1 VIEW  Result Date: 05/07/2022 CLINICAL DATA:  Choking episode while drinking water, developed shortness of breath than oxygen desaturation EXAM: PORTABLE CHEST 1 VIEW COMPARISON:  Portable exam 1453 hours compared to 05/06/2022 FINDINGS: Enlargement of  cardiac silhouette. Mediastinal contours and pulmonary vascularity normal. Chronic accentuation of interstitial markings. Decreased bibasilar atelectasis. No definite new infiltrate, pleural effusion, or pneumothorax. Calcified granuloma LEFT upper lobe. IMPRESSION: Improved aeration since prior study. Electronically Signed   By: Lavonia Dana M.D.   On: 05/07/2022 15:04   ECHOCARDIOGRAM COMPLETE  Result Date: 05/07/2022    ECHOCARDIOGRAM REPORT   Patient Name:   TIMTOHY BROSKI Date of Exam: 05/07/2022 Medical Rec #:  [id number]            Height:       70.0 in Accession #:    [id number]           Weight:       192.0 lb Date of Birth:  [birth year]            BSA:          2.052 m Patient Age:    46 years             BP:           121/71 mmHg Patient Gender: M                    HR:           85 bpm. Exam Location:  [facility] Procedure: 2D Echo, Cardiac Doppler, Color Doppler and Intracardiac            Opacification Agent Indications:    CHF  History:        Patient has prior history of Echocardiogram examinations, most                 recent 04/11/2021. Abnormal ECG, Signs/Symptoms:Dyspnea; Risk                 Factors:Hypertension, Diabetes and Dyslipidemia.  Sonographer:    Wenda Low Referring Phys: [place]  1. Left ventricular ejection fraction, by estimation, is 55 to 60%. The left ventricle has normal function. The left ventricle has no regional wall motion abnormalities. Left ventricular diastolic parameters were normal.  2. Right ventricular systolic function is moderately reduced. The right ventricular size is moderately enlarged. There is severely elevated pulmonary artery systolic pressure.  3. Right atrial size was mildly dilated.  4. The mitral valve is normal in structure. No evidence of mitral valve regurgitation. No evidence of mitral stenosis.  5. The aortic valve is tricuspid. There is mild calcification of the aortic valve. There is mild thickening of the  aortic valve. Aortic valve regurgitation is not visualized. Aortic valve sclerosis is present, with no evidence of aortic valve stenosis.  6. Aortic dilatation noted. There is mild dilatation of the ascending aorta,  measuring 38 mm.  7. The inferior vena cava is normal in size with greater than 50% respiratory variability, suggesting right atrial pressure of 3 mmHg. FINDINGS  Left Ventricle: Left ventricular ejection fraction, by estimation, is 55 to 60%. The left ventricle has normal function. The left ventricle has no regional wall motion abnormalities. Definity contrast agent was given IV to delineate the left ventricular  endocardial borders. The left ventricular internal cavity size was normal in size. There is no left ventricular hypertrophy. Left ventricular diastolic parameters were normal. Right Ventricle: The right ventricular size is moderately enlarged. No increase in right ventricular wall thickness. Right ventricular systolic function is moderately reduced. There is severely elevated pulmonary artery systolic pressure. The tricuspid regurgitant velocity is 3.72 m/s, and with an assumed right atrial pressure of 15 mmHg, the estimated right ventricular systolic pressure is 67.3 mmHg. Left Atrium: Left atrial size was normal in size. Right Atrium: Right atrial size was mildly dilated. Pericardium: There is no evidence of pericardial effusion. Mitral Valve: The mitral valve is normal in structure. No evidence of mitral valve regurgitation. No evidence of mitral valve stenosis. MV peak gradient, 2.1 mmHg. The mean mitral valve gradient is 1.0 mmHg. Tricuspid Valve: The tricuspid valve is normal in structure. Tricuspid valve regurgitation is not demonstrated. No evidence of tricuspid stenosis. Aortic Valve: The aortic valve is tricuspid. There is mild calcification of the aortic valve. There is mild thickening of the aortic valve. Aortic valve regurgitation is not visualized. Aortic valve sclerosis is  present, with no evidence of aortic valve stenosis. Aortic valve mean gradient measures 3.0 mmHg. Aortic valve peak gradient measures 5.5 mmHg. Aortic valve area, by VTI measures 2.22 cm. Pulmonic Valve: The pulmonic valve was normal in structure. Pulmonic valve regurgitation is trivial. No evidence of pulmonic stenosis. Aorta: Aortic dilatation noted. There is mild dilatation of the ascending aorta, measuring 38 mm. Venous: The inferior vena cava is normal in size with greater than 50% respiratory variability, suggesting right atrial pressure of 3 mmHg. IAS/Shunts: No atrial level shunt detected by color flow Doppler.  LEFT VENTRICLE PLAX 2D LVIDd:         4.60 cm   Diastology LVIDs:         2.50 cm   LV e' medial:    8.27 cm/s LV PW:         0.80 cm   LV E/e' medial:  6.7 LV IVS:        0.80 cm   LV e' lateral:   13.10 cm/s LVOT diam:     2.00 cm   LV E/e' lateral: 4.2 LV SV:         47 LV SV Index:   23 LVOT Area:     3.14 cm  RIGHT VENTRICLE RV Basal diam:  3.90 cm RV Mid diam:    3.90 cm RV S prime:     10.90 cm/s TAPSE (M-mode): 2.1 cm LEFT ATRIUM             Index        RIGHT ATRIUM           Index LA diam:        3.30 cm 1.61 cm/m   RA Area:     21.70 cm LA Vol (A2C):   61.8 ml 30.12 ml/m  RA Volume:   59.30 ml  28.91 ml/m LA Vol (A4C):   48.0 ml 23.40 ml/m LA Biplane Vol: 59.4 ml 28.95 ml/m  AORTIC  VALVE                    PULMONIC VALVE AV Area (Vmax):    2.23 cm     PV Vmax:       0.63 m/s AV Area (Vmean):   1.95 cm     PV Peak grad:  1.6 mmHg AV Area (VTI):     2.22 cm AV Vmax:           117.00 cm/s AV Vmean:          80.400 cm/s AV VTI:            0.214 m AV Peak Grad:      5.5 mmHg AV Mean Grad:      3.0 mmHg LVOT Vmax:         83.00 cm/s LVOT Vmean:        50.000 cm/s LVOT VTI:          0.151 m LVOT/AV VTI ratio: 0.71  AORTA Ao Root diam: 3.80 cm Ao Asc diam:  4.10 cm MITRAL VALVE               TRICUSPID VALVE MV Area (PHT): 2.95 cm    TR Peak grad:   55.4 mmHg MV Area VTI:   2.37 cm     TR Vmax:        372.00 cm/s MV Peak grad:  2.1 mmHg MV Mean grad:  1.0 mmHg    SHUNTS MV Vmax:       0.73 m/s    Systemic VTI:  0.15 m MV Vmean:      46.6 cm/s   Systemic Diam: 2.00 cm MV Decel Time: 257 msec MV E velocity: 55.50 cm/s MV A velocity: 77.10 cm/s MV E/A ratio:  0.72 Jenkins Rouge MD Electronically signed by Jenkins Rouge MD Signature Date/Time: 05/07/2022/12:25:42 PM    Final    US Venous Img Lower Bilateral (DVT)  Result Date: 05/06/2022 CLINICAL DATA:  Bilateral lower extremity swelling. EXAM: BILATERAL LOWER EXTREMITY VENOUS DOPPLER ULTRASOUND TECHNIQUE: Gray-scale sonography with compression, as well as color and duplex ultrasound, were performed to evaluate the deep venous system(s) from the level of the common femoral vein through the popliteal and proximal calf veins. COMPARISON:  None Available. FINDINGS: VENOUS The right profunda femoris vein is partially compressible with flow consistent with nonocclusive DVT. The remaining right lower extremity veins are patent with normal flow. Doppler waveform shows normal direction of the venous flow. There is normal compressibility of the left common femoral, superficial femoral, and popliteal veins, as well as the visualized calf veins. Visualized portions of profunda femoral vein and great saphenous vein unremarkable. No filling defects to suggest DVT on grayscale or color Doppler imaging. Doppler waveforms show normal direction of venous flow, normal respiratory plasticity and response to augmentation. OTHER There is marked subcutaneous soft tissue edema in bilateral calves. Limitations: none IMPRESSION: 1.  Nonocclusive DVT of the right profunda femoris vein. 2. The remaining right lower extremity and left lower extremity veins are patent. Electronically Signed   By: Keane Police D.O.   On: 05/06/2022 19:31   CT Angio Chest PE W and/or Wo Contrast  Result Date: 05/06/2022 CLINICAL DATA:  Pulmonary embolism (PE) suspected, positive D-dimer EXAM: CT  ANGIOGRAPHY CHEST WITH CONTRAST TECHNIQUE: Multidetector CT imaging of the chest was performed using the standard protocol during bolus administration of intravenous contrast. Multiplanar CT image reconstructions and MIPs were obtained to evaluate the vascular anatomy.  RADIATION DOSE REDUCTION: This exam was performed according to the departmental dose-optimization program which includes automated exposure control, adjustment of the mA and/or kV according to patient size and/or use of iterative reconstruction technique. CONTRAST:  75m OMNIPAQUE IOHEXOL 350 MG/ML SOLN COMPARISON:  High-resolution chest CT from Apr 22, 2022. FINDINGS: Cardiovascular: Satisfactory opacification of the pulmonary arteries to the segmental level. No evidence of pulmonary embolism. Normal heart size. No pericardial effusion. Coronary artery atherosclerosis. Aortic valve calcifications. Mediastinum/Nodes: Similar prominent mediastinal lymph nodes. Normal appearance of the trachea, esophagus and visualized thyroid. Lungs/Pleura: Low lung volumes. Redemonstrated irregular nodularity in the left lung, greatest in the left upper lobe and measuring up to 1.5 cm. Redemonstrated pulmonary fibrosis, better characterized on the prior. Increased bibasilar consolidation. No pleural effusions. No pneumothorax. Upper Abdomen: No acute abnormality. Musculoskeletal: No chest wall abnormality. No acute or significant osseous findings. Review of the MIP images confirms the above findings. IMPRESSION: 1. No evidence of acute pulmonary embolism. 2. Increased bibasilar consolidation which could represent aspiration and/or pneumonia. 3. Pulmonary nodules and pulmonary fibrosis better characterized on recent high-resolution chest CT from Apr 22, 2022. 4. Aortic valve calcifications and coronary artery disease. Electronically Signed   By: FMargaretha SheffieldM.D.   On: 05/06/2022 13:05   DG Chest Port 1 View  Result Date: 05/06/2022 CLINICAL DATA:  shortness of  breath EXAM: PORTABLE CHEST 1 VIEW COMPARISON:  Chest radiograph dated May 04, 2022 FINDINGS: The cardiomediastinal silhouette is unchanged in contour.Unchanged LEFT apical pulmonary nodule. No pleural effusion. No pneumothorax. Similar appearance of diffuse coarse reticulations throughout bilateral lungs. There is a slightly more confluent appearance at the LEFT lung base in comparison to prior. Visualized abdomen is unremarkable. IMPRESSION: Slightly increased airspace opacities at the LEFT lung base in comparison to prior. Findings may reflect superimposed atelectasis, aspiration or infection on a background of pulmonary fibrosis. Electronically Signed   By: SValentino SaxonM.D.   On: 05/06/2022 10:51   DG Chest 2 View  Result Date: 05/05/2022 CLINICAL DATA:  dyspnea EXAM: CHEST - 2 VIEW COMPARISON:  Chest radiograph dated November 19, 2021; CT dated Apr 22, 2022 FINDINGS: The cardiomediastinal silhouette is unchanged in contour.Low lung volumes. Unchanged LEFT apical pulmonary nodule, better evaluated on prior CT. No pleural effusion. No pneumothorax. Diffuse coarse interstitial opacities with a basilar predominance. Visualized abdomen is unremarkable. Mild degenerative changes of the thoracic spine. IMPRESSION: Revisualization of pulmonary fibrosis. No definitive superimposed acute component is identified. Electronically Signed   By: SValentino SaxonM.D.   On: 05/05/2022 19:25   CT Chest High Resolution  Result Date: 04/24/2022 CLINICAL DATA:  Dyspnea on exertion EXAM: CT CHEST WITHOUT CONTRAST TECHNIQUE: Multidetector CT imaging of the chest was performed following the standard protocol without intravenous contrast. High resolution imaging of the lungs, as well as inspiratory and expiratory imaging, was performed. RADIATION DOSE REDUCTION: This exam was performed according to the departmental dose-optimization program which includes automated exposure control, adjustment of the mA and/or kV  according to patient size and/or use of iterative reconstruction technique. COMPARISON:  11/28/2021, 04/04/2021 FINDINGS: Cardiovascular: Aortic atherosclerosis. Aortic valve calcifications. Mild cardiomegaly. Three-vessel coronary artery calcifications. No pericardial effusion. Mediastinum/Nodes: Numerous unchanged prominent mediastinal lymph nodes. Thyroid gland, trachea, and esophagus demonstrate no significant findings. Lungs/Pleura: Unchanged irregular nodularity in the left lung, concentrated in the left lung base (series 5, image 25), with a conspicuous nodular component in the posterior left apex measuring 1.5 x 1.1 cm, stable and benign (series 5, image 25).  Underlying moderate pulmonary fibrosis without clear apical to basal gradient featuring irregular peripheral interstitial opacity, septal thickening, with some arcing subpleural sparing most conspicuously in the mid lungs (series 5, image 47). No evidence of subpleural bronchiolectasis or honeycombing. No pleural effusion or pneumothorax. Upper Abdomen: No acute abnormality. Musculoskeletal: No chest wall abnormality. No suspicious osseous lesions identified. IMPRESSION: 1. Unchanged irregular nodularity in the left lung, concentrated in the left lung base , with a conspicuous nodular component in the posterior left apex. No further follow-up or characterization is specifically required for these benign nodules. 2. Unchanged, underlying moderate pulmonary fibrosis without clear apical to basal gradient featuring irregular peripheral interstitial opacity, septal thickening, with some arcing subpleural sparing most conspicuously in the mid lungs. No evidence of subpleural bronchiolectasis or honeycombing. As previously reported, these findings favor chronic sequelae of prior COVID pneumonia. If strictly characterized by ATS pulmonary fibrosis criteria, findings are suggestive of an alternative diagnosis (not UIP) per consensus guidelines: Diagnosis of  Idiopathic Pulmonary Fibrosis: An Official ATS/ERS/JRS/ALAT Clinical Practice Guideline. [place], Iss 5, [phone], Aug 02 2017. 3. Numerous unchanged prominent mediastinal lymph nodes, likely reactive. 4. Coronary artery disease. 5. Aortic valve calcifications. Correlate for echocardiographic evidence of aortic valve dysfunction. Aortic Atherosclerosis (ICD10-I70.0). Electronically Signed   By: Delanna Ahmadi M.D.   On: 04/24/2022 08:36    Microbiology: Results for orders placed or performed during the hospital encounter of 05/06/22  Resp Panel by RT-PCR (Flu A&B, Covid) Anterior Nasal Swab     Status: None   Collection Time: 05/06/22 10:32 AM   Specimen: Anterior Nasal Swab  Result Value Ref Range Status   SARS Coronavirus 2 by RT PCR NEGATIVE NEGATIVE Final    Comment: (NOTE) SARS-CoV-2 target nucleic acids are NOT DETECTED.  The SARS-CoV-2 RNA is generally detectable in upper respiratory specimens during the acute phase of infection. The lowest concentration of SARS-CoV-2 viral copies this assay can detect is 138 copies/mL. A negative result does not preclude SARS-Cov-2 infection and should not be used as the sole basis for treatment or other patient management decisions. A negative result may occur with  improper specimen collection/handling, submission of specimen other than nasopharyngeal swab, presence of viral mutation(s) within the areas targeted by this assay, and inadequate number of viral copies(<138 copies/mL). A negative result must be combined with clinical observations, patient history, and epidemiological information. The expected result is Negative.  Fact Sheet for Patients:  EntrepreneurPulse.com.au  Fact Sheet for Healthcare Providers:  IncredibleEmployment.be  This test is no t yet approved or cleared by the [place] FDA and  has been authorized for detection and/or diagnosis of SARS-CoV-2 by FDA  under an Emergency Use Authorization (EUA). This EUA will remain  in effect (meaning this test can be used) for the duration of the COVID-19 declaration under Section 564(b)(1) of the Act, 21 U.S.C.section 360bbb-3(b)(1), unless the authorization is terminated  or revoked sooner.       Influenza A by PCR NEGATIVE NEGATIVE Final   Influenza B by PCR NEGATIVE NEGATIVE Final    Comment: (NOTE) The Xpert Xpress SARS-CoV-2/FLU/RSV plus assay is intended as an aid in the diagnosis of influenza from Nasopharyngeal swab specimens and should not be used as a sole basis for treatment. Nasal washings and aspirates are unacceptable for Xpert Xpress SARS-CoV-2/FLU/RSV testing.  Fact Sheet for Patients: EntrepreneurPulse.com.au  Fact Sheet for Healthcare Providers: IncredibleEmployment.be  This test is not yet approved or cleared by the [place]  States FDA and has been authorized for detection and/or diagnosis of SARS-CoV-2 by FDA under an Emergency Use Authorization (EUA). This EUA will remain in effect (meaning this test can be used) for the duration of the COVID-19 declaration under Section 564(b)(1) of the Act, 21 U.S.C. section 360bbb-3(b)(1), unless the authorization is terminated or revoked.  Performed at [facility], [address], [place], [place] [address]   MRSA Next Gen by PCR, Nasal     Status: Abnormal   Collection Time: 05/06/22  3:31 PM   Specimen: Nasal Mucosa; Nasal Swab  Result Value Ref Range Status   MRSA by PCR Next Gen DETECTED (A) NOT DETECTED Final    Comment: RCRV K EDWARDS AT 1720 ON 05.05.23 BY ADGER J         The GeneXpert MRSA Assay (FDA approved for NASAL specimens only), is one component of a comprehensive MRSA colonization surveillance program. It is not intended to diagnose MRSA infection nor to guide or monitor treatment for MRSA infections. Performed at [facility], [address] [place], [place] [address]      Labs: CBC: Recent Labs  Lab 05/06/22 1032 05/07/22 0333 05/08/22 0345 05/09/22 0436 05/11/22 0535  WBC 5.6 4.9 7.3 5.9 5.9  NEUTROABS 4.5  --   --   --   --   HGB 13.8 13.0 13.2 12.7* 12.8*  HCT 47.8 45.2 45.6 44.4 45.1  MCV 90.5 90.6 89.9 89.5 90.2  PLT 149* 163 173 153 694   Basic Metabolic Panel: Recent Labs  Lab 05/06/22 1154 05/07/22 0333 05/08/22 0345 05/09/22 0436 05/10/22 0354 05/11/22 0535  NA 137 145 143 141 142 140  K 4.3 4.0 3.3* 3.1* 3.9 3.7  CL 100 101 95* 94* 97* 95*  CO2 30 32 38* 37* 37* 41*  GLUCOSE 155* 116* 54* 116* 119* 145*  BUN 33* 34* 29* _0 CREATININE 0.89 1.11 0.87 0.77 0.73 0.83  CALCIUM 8.7* 8.8* 8.7* 8.2* 8.6* 8.1*  MG 2.0  --  2.0  --  2.2  --    Liver Function Tests: Recent Labs  Lab 05/06/22 1154 05/07/22 0333  AST 19 14*  ALT 23 21  ALKPHOS 49 49  BILITOT 0.8 0.8  PROT 6.8 6.7  ALBUMIN 3.5 3.3*   CBG: Recent Labs  Lab 05/10/22 2041 05/11/22 0016 05/11/22 0407 05/11/22 0707 05/11/22 1109  GLUCAP 254* 153* 157* 131* 190*    Discharge time spent: greater than 30 minutes.  Signed: Barton Dubois, MD [facility] 05/11/2022

## 2022-05-11 NOTE — Progress Notes (Signed)
SATURATION QUALIFICATIONS: (This note is used to comply with regulatory documentation for home oxygen)  Patient Saturations on Room Air at Rest = 82%  Patient Saturations on Room Air while Ambulating = 80%  Patient Saturations on 3 Liters of oxygen while Ambulating = 94%  Please briefly explain why patient needs home oxygen: Without oxygen patients sats drop to to 82%

## 2022-05-12 DIAGNOSIS — J9611 Chronic respiratory failure with hypoxia: Secondary | ICD-10-CM | POA: Diagnosis not present

## 2022-05-12 DIAGNOSIS — J449 Chronic obstructive pulmonary disease, unspecified: Secondary | ICD-10-CM | POA: Diagnosis not present

## 2022-05-12 DIAGNOSIS — I5040 Unspecified combined systolic (congestive) and diastolic (congestive) heart failure: Secondary | ICD-10-CM | POA: Diagnosis not present

## 2022-05-14 ENCOUNTER — Telehealth: Payer: Self-pay | Admitting: Internal Medicine

## 2022-05-14 DIAGNOSIS — J9611 Chronic respiratory failure with hypoxia: Secondary | ICD-10-CM | POA: Diagnosis not present

## 2022-05-14 DIAGNOSIS — I110 Hypertensive heart disease with heart failure: Secondary | ICD-10-CM | POA: Diagnosis not present

## 2022-05-14 DIAGNOSIS — J449 Chronic obstructive pulmonary disease, unspecified: Secondary | ICD-10-CM | POA: Diagnosis not present

## 2022-05-14 DIAGNOSIS — E1165 Type 2 diabetes mellitus with hyperglycemia: Secondary | ICD-10-CM | POA: Diagnosis not present

## 2022-05-14 DIAGNOSIS — E1169 Type 2 diabetes mellitus with other specified complication: Secondary | ICD-10-CM | POA: Diagnosis not present

## 2022-05-14 DIAGNOSIS — Z794 Long term (current) use of insulin: Secondary | ICD-10-CM | POA: Diagnosis not present

## 2022-05-14 DIAGNOSIS — D6869 Other thrombophilia: Secondary | ICD-10-CM | POA: Diagnosis not present

## 2022-05-14 DIAGNOSIS — E261 Secondary hyperaldosteronism: Secondary | ICD-10-CM | POA: Diagnosis not present

## 2022-05-14 DIAGNOSIS — I4891 Unspecified atrial fibrillation: Secondary | ICD-10-CM | POA: Diagnosis not present

## 2022-05-14 DIAGNOSIS — F330 Major depressive disorder, recurrent, mild: Secondary | ICD-10-CM | POA: Diagnosis not present

## 2022-05-14 DIAGNOSIS — I82509 Chronic embolism and thrombosis of unspecified deep veins of unspecified lower extremity: Secondary | ICD-10-CM | POA: Diagnosis not present

## 2022-05-14 DIAGNOSIS — J9690 Respiratory failure, unspecified, unspecified whether with hypoxia or hypercapnia: Secondary | ICD-10-CM | POA: Diagnosis not present

## 2022-05-14 NOTE — Telephone Encounter (Signed)
Called and spoke to Tim Day and she explained that the patient told her [facility] needs a signature from Dr. Melvyn Novas to move froward with changing portable O2  from an inogen to portable tanks. Asked Tim Day if she knew what they need a signature on and she states she is unsure.  Will call [facility] to find out what is needed.    Called [facility] and spoke to Tim Day and she states the CMN was faxed to Dr. Durene Cal office and they need the signature from Dr. Nevada Crane so nothing further is needed from our office.   Called and spoke to patients wife Tim Day- ok per dpr and notified her that signature needs to come from Dr. Nevada Crane and she voiced understanding. Nothing further needed at this time.

## 2022-05-14 NOTE — Telephone Encounter (Signed)
Pt released from the hospital Sat. [facility] states they had an order for oxygen, possibly portable.  Have auth but have to get signature from Dr. Melvyn Novas.  Please advise.   PCCs please advise, do you know what we need to send over?

## 2022-05-14 NOTE — Telephone Encounter (Signed)
Doreen Salvage nurse case manager would like to be contacted. Sunday Spillers phone number is [phone].

## 2022-05-14 NOTE — Telephone Encounter (Signed)
ATC Tim Day back. LVM for her to call back with [facility] office number

## 2022-05-14 NOTE — Telephone Encounter (Signed)
I dont see a order or anything like that I also do not see a cmn either so not sure what they are needing
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
## 2022-05-15 ENCOUNTER — Ambulatory Visit: Payer: PPO | Admitting: Cardiology

## 2022-05-15 DIAGNOSIS — I110 Hypertensive heart disease with heart failure: Secondary | ICD-10-CM | POA: Diagnosis not present

## 2022-05-15 DIAGNOSIS — D696 Thrombocytopenia, unspecified: Secondary | ICD-10-CM | POA: Diagnosis not present

## 2022-05-15 DIAGNOSIS — J9622 Acute and chronic respiratory failure with hypercapnia: Secondary | ICD-10-CM | POA: Diagnosis not present

## 2022-05-15 DIAGNOSIS — E119 Type 2 diabetes mellitus without complications: Secondary | ICD-10-CM | POA: Diagnosis not present

## 2022-05-15 DIAGNOSIS — Z7901 Long term (current) use of anticoagulants: Secondary | ICD-10-CM | POA: Diagnosis not present

## 2022-05-15 DIAGNOSIS — Z85820 Personal history of malignant melanoma of skin: Secondary | ICD-10-CM | POA: Diagnosis not present

## 2022-05-15 DIAGNOSIS — I10 Essential (primary) hypertension: Secondary | ICD-10-CM

## 2022-05-15 DIAGNOSIS — J9611 Chronic respiratory failure with hypoxia: Secondary | ICD-10-CM

## 2022-05-15 DIAGNOSIS — R931 Abnormal findings on diagnostic imaging of heart and coronary circulation: Secondary | ICD-10-CM

## 2022-05-15 DIAGNOSIS — Z7984 Long term (current) use of oral hypoglycemic drugs: Secondary | ICD-10-CM | POA: Diagnosis not present

## 2022-05-15 DIAGNOSIS — E782 Mixed hyperlipidemia: Secondary | ICD-10-CM | POA: Diagnosis not present

## 2022-05-15 DIAGNOSIS — Z8616 Personal history of COVID-19: Secondary | ICD-10-CM | POA: Diagnosis not present

## 2022-05-15 DIAGNOSIS — Z794 Long term (current) use of insulin: Secondary | ICD-10-CM | POA: Diagnosis not present

## 2022-05-15 DIAGNOSIS — I82411 Acute embolism and thrombosis of right femoral vein: Secondary | ICD-10-CM | POA: Diagnosis not present

## 2022-05-15 DIAGNOSIS — K219 Gastro-esophageal reflux disease without esophagitis: Secondary | ICD-10-CM | POA: Diagnosis not present

## 2022-05-15 DIAGNOSIS — E118 Type 2 diabetes mellitus with unspecified complications: Secondary | ICD-10-CM

## 2022-05-15 DIAGNOSIS — J9621 Acute and chronic respiratory failure with hypoxia: Secondary | ICD-10-CM | POA: Diagnosis not present

## 2022-05-15 DIAGNOSIS — F32A Depression, unspecified: Secondary | ICD-10-CM | POA: Diagnosis not present

## 2022-05-15 DIAGNOSIS — F419 Anxiety disorder, unspecified: Secondary | ICD-10-CM | POA: Diagnosis not present

## 2022-05-15 DIAGNOSIS — R911 Solitary pulmonary nodule: Secondary | ICD-10-CM | POA: Diagnosis not present

## 2022-05-15 DIAGNOSIS — I5033 Acute on chronic diastolic (congestive) heart failure: Secondary | ICD-10-CM | POA: Diagnosis not present

## 2022-05-15 DIAGNOSIS — Z9981 Dependence on supplemental oxygen: Secondary | ICD-10-CM | POA: Diagnosis not present

## 2022-05-15 DIAGNOSIS — J181 Lobar pneumonia, unspecified organism: Secondary | ICD-10-CM | POA: Diagnosis not present

## 2022-05-15 DIAGNOSIS — Z9181 History of falling: Secondary | ICD-10-CM | POA: Diagnosis not present

## 2022-05-15 DIAGNOSIS — F5101 Primary insomnia: Secondary | ICD-10-CM | POA: Diagnosis not present

## 2022-05-17 DIAGNOSIS — E782 Mixed hyperlipidemia: Secondary | ICD-10-CM | POA: Diagnosis not present

## 2022-05-17 DIAGNOSIS — J9622 Acute and chronic respiratory failure with hypercapnia: Secondary | ICD-10-CM | POA: Diagnosis not present

## 2022-05-17 DIAGNOSIS — F32A Depression, unspecified: Secondary | ICD-10-CM | POA: Diagnosis not present

## 2022-05-17 DIAGNOSIS — J9621 Acute and chronic respiratory failure with hypoxia: Secondary | ICD-10-CM | POA: Diagnosis not present

## 2022-05-17 DIAGNOSIS — I110 Hypertensive heart disease with heart failure: Secondary | ICD-10-CM | POA: Diagnosis not present

## 2022-05-17 DIAGNOSIS — R911 Solitary pulmonary nodule: Secondary | ICD-10-CM | POA: Diagnosis not present

## 2022-05-17 DIAGNOSIS — F5101 Primary insomnia: Secondary | ICD-10-CM | POA: Diagnosis not present

## 2022-05-17 DIAGNOSIS — Z8616 Personal history of COVID-19: Secondary | ICD-10-CM | POA: Diagnosis not present

## 2022-05-17 DIAGNOSIS — Z9181 History of falling: Secondary | ICD-10-CM | POA: Diagnosis not present

## 2022-05-17 DIAGNOSIS — E119 Type 2 diabetes mellitus without complications: Secondary | ICD-10-CM | POA: Diagnosis not present

## 2022-05-17 DIAGNOSIS — Z9981 Dependence on supplemental oxygen: Secondary | ICD-10-CM | POA: Diagnosis not present

## 2022-05-17 DIAGNOSIS — Z85820 Personal history of malignant melanoma of skin: Secondary | ICD-10-CM | POA: Diagnosis not present

## 2022-05-17 DIAGNOSIS — Z7901 Long term (current) use of anticoagulants: Secondary | ICD-10-CM | POA: Diagnosis not present

## 2022-05-17 DIAGNOSIS — J181 Lobar pneumonia, unspecified organism: Secondary | ICD-10-CM | POA: Diagnosis not present

## 2022-05-17 DIAGNOSIS — D696 Thrombocytopenia, unspecified: Secondary | ICD-10-CM | POA: Diagnosis not present

## 2022-05-17 DIAGNOSIS — K219 Gastro-esophageal reflux disease without esophagitis: Secondary | ICD-10-CM | POA: Diagnosis not present

## 2022-05-17 DIAGNOSIS — Z794 Long term (current) use of insulin: Secondary | ICD-10-CM | POA: Diagnosis not present

## 2022-05-17 DIAGNOSIS — I5033 Acute on chronic diastolic (congestive) heart failure: Secondary | ICD-10-CM | POA: Diagnosis not present

## 2022-05-17 DIAGNOSIS — I82411 Acute embolism and thrombosis of right femoral vein: Secondary | ICD-10-CM | POA: Diagnosis not present

## 2022-05-17 DIAGNOSIS — Z7984 Long term (current) use of oral hypoglycemic drugs: Secondary | ICD-10-CM | POA: Diagnosis not present

## 2022-05-17 DIAGNOSIS — F419 Anxiety disorder, unspecified: Secondary | ICD-10-CM | POA: Diagnosis not present

## 2022-05-17 LAB — PROCALCITONIN

## 2022-05-18 ENCOUNTER — Ambulatory Visit
Admission: EM | Admit: 2022-05-18 | Discharge: 2022-05-18 | Disposition: A | Discharge status: Nursing Home (Includes ICF) | Payer: HMO | Attending: Family Medicine | Admitting: Family Medicine

## 2022-05-18 ENCOUNTER — Emergency Department (HOSPITAL_COMMUNITY)
Admission: EM | Admit: 2022-05-18 | Discharge: 2022-05-18 | Disposition: A | Discharge status: Home / Self Care | Payer: HMO | Attending: Emergency Medicine | Admitting: Emergency Medicine

## 2022-05-18 ENCOUNTER — Other Ambulatory Visit: Payer: Self-pay

## 2022-05-18 ENCOUNTER — Encounter (HOSPITAL_COMMUNITY): Payer: Self-pay | Admitting: Emergency Medicine

## 2022-05-18 DIAGNOSIS — I110 Hypertensive heart disease with heart failure: Secondary | ICD-10-CM | POA: Insufficient documentation

## 2022-05-18 DIAGNOSIS — Z7984 Long term (current) use of oral hypoglycemic drugs: Secondary | ICD-10-CM | POA: Insufficient documentation

## 2022-05-18 DIAGNOSIS — Z794 Long term (current) use of insulin: Secondary | ICD-10-CM | POA: Diagnosis not present

## 2022-05-18 DIAGNOSIS — I776 Arteritis, unspecified: Secondary | ICD-10-CM

## 2022-05-18 DIAGNOSIS — R21 Rash and other nonspecific skin eruption: Secondary | ICD-10-CM | POA: Diagnosis present

## 2022-05-18 DIAGNOSIS — E119 Type 2 diabetes mellitus without complications: Secondary | ICD-10-CM | POA: Insufficient documentation

## 2022-05-18 DIAGNOSIS — L959 Vasculitis limited to the skin, unspecified: Secondary | ICD-10-CM | POA: Insufficient documentation

## 2022-05-18 DIAGNOSIS — Z79899 Other long term (current) drug therapy: Secondary | ICD-10-CM | POA: Insufficient documentation

## 2022-05-18 DIAGNOSIS — Z7901 Long term (current) use of anticoagulants: Secondary | ICD-10-CM | POA: Diagnosis not present

## 2022-05-18 DIAGNOSIS — I509 Heart failure, unspecified: Secondary | ICD-10-CM | POA: Diagnosis not present

## 2022-05-18 DIAGNOSIS — R7989 Other specified abnormal findings of blood chemistry: Secondary | ICD-10-CM | POA: Diagnosis not present

## 2022-05-18 LAB — CBC WITH DIFFERENTIAL/PLATELET
Abs Immature Granulocytes: 0.05 10*3/uL (ref 0.00–0.07)
Basophils Absolute: 0 10*3/uL (ref 0.0–0.1)
Basophils Relative: 1 %
Eosinophils Absolute: 0.1 10*3/uL (ref 0.0–0.5)
Eosinophils Relative: 2 %
HCT: 49 % (ref 39.0–52.0)
Hemoglobin: 15.2 g/dL (ref 13.0–17.0)
Immature Granulocytes: 1 %
Lymphocytes Relative: 15 %
Lymphs Abs: 1.3 10*3/uL (ref 0.7–4.0)
MCH: 26.2 pg (ref 26.0–34.0)
MCHC: 31 g/dL (ref 30.0–36.0)
MCV: 84.3 fL (ref 80.0–100.0)
Monocytes Absolute: 0.6 10*3/uL (ref 0.1–1.0)
Monocytes Relative: 7 %
Neutro Abs: 6.4 10*3/uL (ref 1.7–7.7)
Neutrophils Relative %: 74 %
Platelets: 282 10*3/uL (ref 150–400)
RBC: 5.81 MIL/uL (ref 4.22–5.81)
RDW: 16.1 % — ABNORMAL HIGH (ref 11.5–15.5)
WBC: 8.4 10*3/uL (ref 4.0–10.5)
nRBC: 0 % (ref 0.0–0.2)

## 2022-05-18 LAB — BASIC METABOLIC PANEL
Anion gap: 12 (ref 5–15)
BUN: 30 mg/dL — ABNORMAL HIGH (ref 8–23)
CO2: 27 mmol/L (ref 22–32)
Calcium: 9.3 mg/dL (ref 8.9–10.3)
Chloride: 96 mmol/L — ABNORMAL LOW (ref 98–111)
Creatinine, Ser: 1.21 mg/dL (ref 0.61–1.24)
GFR, Estimated: >60 mL/min (ref >60)
Glucose, Bld: 146 mg/dL — ABNORMAL HIGH (ref 70–99)
Potassium: 4.5 mmol/L (ref 3.5–5.1)
Sodium: 135 mmol/L (ref 135–145)

## 2022-05-18 LAB — PROTIME-INR
INR: 1.3 — ABNORMAL HIGH (ref 0.8–1.2)
Prothrombin Time: 16.1 seconds — ABNORMAL HIGH (ref 11.4–15.2)

## 2022-05-18 LAB — C-REACTIVE PROTEIN: CRP: 1.1 mg/dL — ABNORMAL HIGH (ref <1.0)

## 2022-05-18 MED ORDER — PREDNISONE 10 MG PO TABS: ORAL_TABLET | ORAL | 0 refills | Status: DC

## 2022-05-18 MED ORDER — METHYLPREDNISOLONE SODIUM SUCC 125 MG IJ SOLR
125.0000 mg | Freq: Once | INTRAMUSCULAR | Status: AC
  Administered 2022-05-18: 125 mg via INTRAVENOUS
  Filled 2022-05-18: qty 2

## 2022-05-18 NOTE — ED Provider Notes (Incomplete)
[facility] Provider Note   CSN: [id number] Arrival date & time: 05/18/22  1448     History {Add pertinent medical, surgical, social history, OB history to HPI:1} Chief Complaint  Patient presents with   Rash    [facility] is a 71 y.o. male.   Rash Associated symptoms: no abdominal pain, no fever, no headaches, no nausea, no shortness of breath and not vomiting        [facility] is a 71 y.o. male with past medical history significant for type 2 diabetes, hypertension, CHF, pulmonary fibrosis, and chronically on supplemental oxygen at 2 to 3 L, who presents to the Emergency Department requesting evaluation of a persistent rash.  He was seen earlier today at local urgent care and advised to come to the ER for further evaluation of his rash for possible vasculitis.  He was admitted into the Day on 05/06/2022 for pneumonia and chronic respiratory failure with hypoxia.  He was treated during his Day admission with Unasyn for his pneumonia and transitioned to oral Augmentin.  He was also completed a 3-day course of Zithromax.  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  Prior to his discharge home he had a red itchy rash to his groin area that was felt to be fungal and prescribed nystatin powder which has not provided any relief.  2 to 3 days ago, he noticed having a red nonpainful nonpruritic rash of his abdomen that has now spread to his back, bilateral arms and bilateral legs.  He denies any chest pain or worsening shortness of breath.  No other exposures to new chemicals, detergents.  No swelling of his face lips or tongue.  Home Medications Prior to Admission medications   Medication Sig Start Date End Date Taking? Authorizing Provider  ACCU-CHEK GUIDE test strip USE TO TEST TWICE DAILY.D 02/06/21   [provider]   acetaminophen (TYLENOL) 500 MG tablet Take 1,000 mg by mouth 2 (two) times daily as needed for moderate pain.    [provider]  alclomethasone (ACLOVATE) 0.05 % cream Apply topically 2 (two) times daily as needed (Rash). 03/13/22   Sheffield, Ronalee Red, PA-C  ALPRAZolam (XANAX) 0.25 MG tablet Take 0.25 mg by mouth daily as needed for anxiety.    [provider]  apixaban (ELIQUIS) 5 MG TABS tablet Take 1 tablet (5 mg total) by mouth 2 (two) times daily. 05/11/22   Barton Dubois, MD  ascorbic acid (VITAMIN C) 500 MG tablet Take 1 tablet (500 mg total) by mouth daily. 08/04/20   Johnson, Clanford L, MD  blood glucose meter kit and supplies KIT Dispense based on patient and insurance preference. Use up to four times daily as directed. (FOR ICD-9 250.00, 250.01). 08/14/20   Little Ishikawa, MD  buPROPion (WELLBUTRIN XL) 150 MG 24 hr tablet Take 150 mg by mouth daily. 04/25/22   [provider]  Cholecalciferol (VITAMIN D3) 125 MCG (5000 UT) CAPS Take 5,000 Units by mouth daily.    [provider]  co-enzyme Q-10 50 MG capsule Take 50 mg by mouth 2 (two) times daily.    [provider]  Empagliflozin (JARDIANCE PO) Take 25 mg by mouth.    [provider]  famotidine (PEPCID) 20 MG tablet TAKE ONE TABLET BY MOUTH DAILY AFTER SUPPER Patient taking differently: Take 20  mg by mouth at bedtime. 02/25/22   Tanda Rockers, MD  FIASP FLEXTOUCH 100 UNIT/ML FlexTouch Pen Inject 2-8 Units into the skin every evening. 02/26/21   [provider]  furosemide (LASIX) 40 MG tablet Take 1 tablet (40 mg total) by mouth daily. 05/11/22   Barton Dubois, MD  Insulin Pen Needle (PEN NEEDLES) 32G X 4 MM MISC 1 Package by Does not apply route 4 (four) times daily -  before meals and at bedtime. 08/14/20   Little Ishikawa, MD  ipratropium-albuterol (DUONEB) 0.5-2.5 (3) MG/3ML SOLN Take 3 mLs by nebulization every 6 (six) hours as needed (SOB/dizziness.). 05/11/22    Barton Dubois, MD  ketoconazole (NIZORAL) 2 % shampoo Apply topically. 01/04/22   [provider]  Lancets (ONETOUCH DELICA PLUS [id number]) MISC Apply topically 2 (two) times daily. 12/07/21   [provider]  metFORMIN (GLUCOPHAGE) 1000 MG tablet Take 1,000 mg by mouth 2 (two) times daily with a meal.    [provider]  Multiple Vitamin (MULTIVITAMIN) capsule Take 1 capsule by mouth daily.    [provider]  nystatin (MYCOSTATIN/NYSTOP) powder Apply topically 2 (two) times daily. 05/11/22   Barton Dubois, MD  pantoprazole (PROTONIX) 40 MG tablet Take 40 mg by mouth daily.    [provider]  Phenylephrine HCl (SINEX REGULAR NA) Place 1 spray into the nose daily as needed (congestion).    [provider]  POTASSIUM PO Take 1 tablet by mouth daily as needed (along with lasix).    [provider]  rosuvastatin (CRESTOR) 5 MG tablet Take 5 mg by mouth daily. 03/05/21   [provider]  sertraline (ZOLOFT) 50 MG tablet Take 100 mg by mouth daily.    [provider]  tamsulosin (FLOMAX) 0.4 MG CAPS capsule Take 0.4 mg by mouth at bedtime. 01/28/22   [provider]  TOUJEO MAX SOLOSTAR 300 UNIT/ML Solostar Pen Inject 20 Units into the skin in the morning. 05/11/22   Barton Dubois, MD  zinc sulfate 220 (50 Zn) MG capsule Take 1 capsule (220 mg total) by mouth daily. 08/04/20   Murlean Iba, MD      Allergies    Patient has no known allergies.    Review of Systems   Review of Systems  Constitutional:  Negative for appetite change and fever.  Eyes:  Negative for visual disturbance.  Respiratory:  Negative for chest tightness and shortness of breath.   Cardiovascular:  Negative for chest pain.  Gastrointestinal:  Negative for abdominal pain, nausea and vomiting.  Skin:  Positive for color change and rash.  Neurological:  Negative for dizziness, weakness and headaches.    Physical Exam Updated Vital  Signs BP (!) 110/96 (BP Location: Right Arm)   Pulse 99   Temp 98 F (36.7 C) (Oral)   Resp 19   Ht _0  (1.778 m)   Wt 79.4 kg   SpO2 94%   BMI 25.11 kg/m  Physical Exam Vitals and nursing note reviewed.  Constitutional:      General: He is not in acute distress.    Appearance: Normal appearance. He is not diaphoretic.  Cardiovascular:     Rate and Rhythm: Normal rate and regular rhythm.     Pulses: Normal pulses.  Pulmonary:     Effort: Pulmonary effort is normal. No respiratory distress.     Comments: Patient is on 2 to 3 L oxygen by nasal cannula at baseline.  Lungs clear, no increased  work of breathing Abdominal:     Palpations: Abdomen is soft.     Tenderness: There is no abdominal tenderness.  Musculoskeletal:        General: Normal range of motion.     Cervical back: Normal range of motion.     Right lower leg: No edema.     Left lower leg: No edema.  Skin:    General: Skin is warm.     Capillary Refill: Capillary refill takes less than 2 seconds.     Findings: Erythema and rash present.     Comments: Petechial rash to the bilateral lower extremities, nonblanching.  Similar appearing rash to the trunk and bilateral upper extremities.  Hands are spared.  No rash of the face or upper chest. See attached photos  Neurological:     General: No focal deficit present.     Mental Status: He is alert.     Sensory: No sensory deficit.     Motor: No weakness.          ED Results / Procedures / Treatments   Labs (all labs ordered are listed, but only abnormal results are displayed) Labs Reviewed  CBC WITH DIFFERENTIAL/PLATELET  BASIC METABOLIC PANEL  PROTIME-INR  C-REACTIVE PROTEIN  ANTINUCLEAR ANTIBODIES, IFA    EKG None  Radiology No results found.  Procedures Procedures  {Document cardiac monitor, telemetry assessment procedure when appropriate:1}  Medications Ordered in ED Medications - No data to display  ED Course/ Medical Decision Making/  A&P                           Medical Decision Making Patient here for evaluation of rash to the trunk bilateral upper and lower extremities and genital area.  Recently admitted to the Day for pneumonia received IV and oral antibiotics.  Finished oral Augmentin earlier this week.  Had itchy red rash of his genitals at time of Day discharge was felt to be fungal in origin and given nystatin powder which patient states has not relieved his symptoms.  Developed petechial rash of the torso and extremities 2 to 3 days ago.  Denies any pain or itching of these areas.  Seen at urgent care earlier today and sent here for concern of vasculitis.  Amount and/or Complexity of Data Reviewed Labs: ordered.     {Document critical care time when appropriate:1} {Document review of labs and clinical decision tools ie heart score, Chads2Vasc2 etc:1}  {Document your independent review of radiology images, and any outside records:1} {Document your discussion with family members, caretakers, and with consultants:1} {Document social determinants of health affecting pt's care:1} {Document your decision making why or why not admission, treatments were needed:1} Final Clinical Impression(s) / ED Diagnoses Final diagnoses:  None    Rx / DC Orders ED Discharge Orders     None

## 2022-05-18 NOTE — Discharge Instructions (Signed)
You may start the prednisone prescription tomorrow.  Please keep your appointment with your primary care provider for Monday.  Return to the emergency department for any new or worsening symptoms.

## 2022-05-18 NOTE — ED Triage Notes (Signed)
Patient c/o rash to entire body. Patient recently discharged from hospital for pneumonia but was being treated for possible yeast infection in groin. Per patient woke yesterday rash to entire body. Denies any itching or pain. Patient seen at Urgent Care and sent here due to being on Eliquis and concerns for rash being related to blood vessel and platelet count.

## 2022-05-18 NOTE — ED Triage Notes (Signed)
Pt states that that he had a rash in the hospital on 05/06/22 on his groin  Pt states that the hospital prescribed nystatin powder but it did not help  Pt states he woke up yesterday and the rash had spread all over his body   Pt states no pain or itching

## 2022-05-18 NOTE — ED Notes (Signed)
Patient is being discharged from the Urgent Care and sent to the Emergency Department via POV . Per MD, patient is in need of higher level of care due to Vasculitis. Patient is aware and verbalizes understanding of plan of care.  Vitals:   05/18/22 1341  BP: 110/72  Pulse: 94  Resp: 18  Temp: 98.3 F (36.8 C)  SpO2: 94%

## 2022-05-20 DIAGNOSIS — R21 Rash and other nonspecific skin eruption: Secondary | ICD-10-CM | POA: Diagnosis not present

## 2022-05-20 DIAGNOSIS — I5042 Chronic combined systolic (congestive) and diastolic (congestive) heart failure: Secondary | ICD-10-CM | POA: Diagnosis not present

## 2022-05-20 DIAGNOSIS — L309 Dermatitis, unspecified: Secondary | ICD-10-CM | POA: Diagnosis not present

## 2022-05-20 DIAGNOSIS — Z8701 Personal history of pneumonia (recurrent): Secondary | ICD-10-CM | POA: Diagnosis not present

## 2022-05-20 DIAGNOSIS — D6869 Other thrombophilia: Secondary | ICD-10-CM | POA: Diagnosis not present

## 2022-05-20 DIAGNOSIS — J9611 Chronic respiratory failure with hypoxia: Secondary | ICD-10-CM | POA: Diagnosis not present

## 2022-05-20 DIAGNOSIS — I700 Atherosclerosis of aorta: Secondary | ICD-10-CM | POA: Diagnosis not present
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
## 2022-05-22 LAB — ANTINUCLEAR ANTIBODIES, IFA: ANA Ab, IFA: POSITIVE — AB

## 2022-05-22 LAB — FANA STAINING PATTERNS: Speckled Pattern: 24529

## 2022-05-26 DIAGNOSIS — I825Y9 Chronic embolism and thrombosis of unspecified deep veins of unspecified proximal lower extremity: Secondary | ICD-10-CM | POA: Insufficient documentation

## 2022-05-26 DIAGNOSIS — I272 Pulmonary hypertension, unspecified: Secondary | ICD-10-CM | POA: Insufficient documentation

## 2022-05-27 ENCOUNTER — Encounter: Payer: Self-pay | Admitting: Cardiology

## 2022-05-27 ENCOUNTER — Ambulatory Visit: Payer: HMO | Admitting: Cardiology

## 2022-05-27 VITALS — BP 108/62 | HR 91 | Ht 70.0 in | Wt 178.2 lb

## 2022-05-27 DIAGNOSIS — I5033 Acute on chronic diastolic (congestive) heart failure: Secondary | ICD-10-CM

## 2022-05-27 DIAGNOSIS — I272 Pulmonary hypertension, unspecified: Secondary | ICD-10-CM | POA: Diagnosis not present

## 2022-05-27 DIAGNOSIS — I10 Essential (primary) hypertension: Secondary | ICD-10-CM | POA: Diagnosis not present

## 2022-05-27 DIAGNOSIS — I825Y9 Chronic embolism and thrombosis of unspecified deep veins of unspecified proximal lower extremity: Secondary | ICD-10-CM

## 2022-05-27 DIAGNOSIS — R0602 Shortness of breath: Secondary | ICD-10-CM

## 2022-05-27 MED ORDER — FUROSEMIDE 40 MG PO TABS: 40.0000 mg | ORAL_TABLET | Freq: Every day | ORAL | 3 refills | Status: DC

## 2022-05-27 MED ORDER — POTASSIUM CHLORIDE CRYS ER 20 MEQ PO TBCR: 20.0000 meq | EXTENDED_RELEASE_TABLET | Freq: Every day | ORAL | 3 refills | Status: DC

## 2022-05-31 DIAGNOSIS — I10 Essential (primary) hypertension: Secondary | ICD-10-CM | POA: Diagnosis not present

## 2022-05-31 DIAGNOSIS — E1165 Type 2 diabetes mellitus with hyperglycemia: Secondary | ICD-10-CM | POA: Diagnosis not present

## 2022-05-31 DIAGNOSIS — E782 Mixed hyperlipidemia: Secondary | ICD-10-CM | POA: Diagnosis not present

## 2022-05-31 DIAGNOSIS — I5042 Chronic combined systolic (congestive) and diastolic (congestive) heart failure: Secondary | ICD-10-CM | POA: Diagnosis not present

## 2022-06-05 ENCOUNTER — Ambulatory Visit: Payer: PPO | Admitting: Cardiology

## 2022-06-06 ENCOUNTER — Encounter: Payer: Self-pay | Admitting: Internal Medicine

## 2022-06-06 ENCOUNTER — Ambulatory Visit: Payer: HMO | Admitting: Internal Medicine

## 2022-06-06 DIAGNOSIS — Z8616 Personal history of COVID-19: Secondary | ICD-10-CM | POA: Diagnosis not present

## 2022-06-06 DIAGNOSIS — J9611 Chronic respiratory failure with hypoxia: Secondary | ICD-10-CM | POA: Diagnosis not present

## 2022-06-06 DIAGNOSIS — U071 COVID-19: Secondary | ICD-10-CM | POA: Diagnosis not present

## 2022-06-06 DIAGNOSIS — K219 Gastro-esophageal reflux disease without esophagitis: Secondary | ICD-10-CM | POA: Diagnosis not present

## 2022-06-06 DIAGNOSIS — J9622 Acute and chronic respiratory failure with hypercapnia: Secondary | ICD-10-CM | POA: Diagnosis not present

## 2022-06-06 DIAGNOSIS — I82403 Acute embolism and thrombosis of unspecified deep veins of lower extremity, bilateral: Secondary | ICD-10-CM | POA: Diagnosis not present

## 2022-06-06 DIAGNOSIS — E782 Mixed hyperlipidemia: Secondary | ICD-10-CM | POA: Diagnosis not present

## 2022-06-06 DIAGNOSIS — E119 Type 2 diabetes mellitus without complications: Secondary | ICD-10-CM | POA: Diagnosis not present

## 2022-06-06 DIAGNOSIS — D696 Thrombocytopenia, unspecified: Secondary | ICD-10-CM | POA: Diagnosis not present

## 2022-06-06 DIAGNOSIS — F32A Depression, unspecified: Secondary | ICD-10-CM | POA: Diagnosis not present

## 2022-06-06 DIAGNOSIS — Z85820 Personal history of malignant melanoma of skin: Secondary | ICD-10-CM | POA: Diagnosis not present

## 2022-06-06 DIAGNOSIS — F5101 Primary insomnia: Secondary | ICD-10-CM | POA: Diagnosis not present

## 2022-06-06 DIAGNOSIS — Z7984 Long term (current) use of oral hypoglycemic drugs: Secondary | ICD-10-CM | POA: Diagnosis not present

## 2022-06-06 DIAGNOSIS — J9621 Acute and chronic respiratory failure with hypoxia: Secondary | ICD-10-CM | POA: Diagnosis not present

## 2022-06-06 DIAGNOSIS — Z9981 Dependence on supplemental oxygen: Secondary | ICD-10-CM | POA: Diagnosis not present

## 2022-06-06 DIAGNOSIS — I5033 Acute on chronic diastolic (congestive) heart failure: Secondary | ICD-10-CM | POA: Diagnosis not present

## 2022-06-06 DIAGNOSIS — R911 Solitary pulmonary nodule: Secondary | ICD-10-CM | POA: Diagnosis not present

## 2022-06-06 DIAGNOSIS — F419 Anxiety disorder, unspecified: Secondary | ICD-10-CM | POA: Diagnosis not present

## 2022-06-06 DIAGNOSIS — Z9181 History of falling: Secondary | ICD-10-CM | POA: Diagnosis not present

## 2022-06-06 DIAGNOSIS — I82411 Acute embolism and thrombosis of right femoral vein: Secondary | ICD-10-CM | POA: Diagnosis not present

## 2022-06-06 DIAGNOSIS — Z794 Long term (current) use of insulin: Secondary | ICD-10-CM | POA: Diagnosis not present

## 2022-06-06 DIAGNOSIS — I110 Hypertensive heart disease with heart failure: Secondary | ICD-10-CM | POA: Diagnosis not present

## 2022-06-06 DIAGNOSIS — Z7901 Long term (current) use of anticoagulants: Secondary | ICD-10-CM | POA: Diagnosis not present

## 2022-06-06 DIAGNOSIS — J181 Lobar pneumonia, unspecified organism: Secondary | ICD-10-CM | POA: Diagnosis not present

## 2022-06-06 NOTE — Progress Notes (Signed)
[place], male    DOB: [birth year]    MRN: [id number]   Brief patient profile:  34  yowm never smoker with DM then covid Jul 28 2020   Admit date: 08/19/2020 Discharge date: 09/28/2020   Recommendations for Outpatient Follow-up:  Follow-up urology in 2 weeks for voiding trial Patient will be discharged with Foley catheter Continue oxygen 2 to 3 L/min via nasal cannula, wean off as tolerated Follow-up with ophthalmology as outpatient   Discharge Diagnoses:  Active Problems:   DM2 (diabetes mellitus, type 2) (HCC)   Benign essential HTN   Dyslipidemia   Chest tube in place   COVID-19 virus infection   Acute hypoxemic respiratory failure due to COVID-19 [facility])   Pneumothorax, left   Pneumonia due to COVID-19 virus   Leg DVT (deep venous thromboembolism), acute, bilateral (HCC)   Pressure injury of skin   Acute respiratory failure with hypoxia (HCC)   HCAP (healthcare-associated pneumonia)   Primary spontaneous pneumothorax   Protein-calorie malnutrition, severe   Adjustment disorder with anxious mood      History of present illness:  71 year old male with recent Covid pneumonia, 08/08/2020 -  0/38/3338, complicated by bilateral DVTs was admitted 08/19/2020 with left pneumothorax requiring chest tube placement and subsequent intubation 08/20/2020.  He was extubated following day but had persistent bilateral infiltrates requiring high flow nasal cannula 10 to 15 L/min oxygen.  Hospital course complicated by urinary retention, Foley catheter was inserted.  Also had episode of sudden loss of vision in right eye which has now resolved.  Neurology work-up was negative.   Hospital Course:  Acute hypoxemic and hypercarbic respiratory failure-secondary to recent COVID-19 pneumonia, s/p left pneumothorax.  Wean down to 2 L nasal cannula oxygen, continue to wean off as tolerated.  Continue to wean prednisone.  Will discharge on prednisone taper for 5 more days.  Continue as needed  albuterol inhaler. Acute urine retention-started on tamsulosin, Foley catheter.  Failed voiding trial x2.    Urology recommends to discharge patient with Foley catheter. Urology will see patient as outpatient. Bilateral DVTs- continue Eliquis. Diabetes mellitus type 2-continue Lantus, sliding scale insulin with NovoLog, linagliptin.  CBG well controlled. Depression/anxiety-continue Ativan as needed Sudden unilateral vision loss-resolved, patient had episode of sudden loss of vision right eye on 09/07/2020, he was unable to see anything for few seconds only, later on his vision came back but it remained blurry for 15 to 20 minutes.  CT head without contrast was negative.  MRI was unremarkable.  Bilateral carotid duplex was unremarkable.  Discussed with patient and his wife, he needs to follow-up with ophthalmology as outpatient. Left eye subconjunctival hemorrhage-stable       History of Present Illness  02/09/2021  Pulmonary/ 1st office eval/Olyn Landstrom  Chief Complaint  Patient presents with   Consult    Shortness of breath with a lot of exertion  Dyspnea:  Walks x 10 laps = maybe 6-8 min at slow pace at building / walks s 02 with  sats 86% Baseline faster walk s 02  Cough: some pnds mucus is clear Sleep: has sleep number bed but still in incliner with 30 degrees fine  SABA use: not helping 02  1.5 hs conc and prn daytime but not titrating  rec Continue pantoprazole 40 mg Take 30-60 min before first meal of the day and add pepcid 20 mg after supper  GERD No need for inhalers  Make sure you check your oxygen saturation  at your highest level  of activity  to be sure it stays over 90% and adjust  02 flow upward to maintain this level if needed but remember to turn it back to previous settings when you stop (to conserve your supply).  Please schedule a follow up office visit in 6 weeks, call sooner if needed with PFT's on return     10/02/2021  f/u ov/[place] office/Vanderbilt Ranieri re: post covid pna  resp failure   Chief Complaint  Patient presents with   Follow-up    From lobby to room pt O2 sats are at 87% on 3lpm. After sitting O2 sats increased to 92% 3lpm   Sob  and cough are the same as last visit per patient.   Dyspnea:  walking indoors x  48mn on 4lpm pulsed /  Cough: min mucoid Sleeping: recliner 30 degrees/ 2lpm conc SABA use: none  02: 2lpm and 4 lpm walking  Covid status: 2 total vax Lung cancer screening: never smoker  Rec Make sure you check your oxygen saturation  at your highest level of activity   Bicycle ergometer should be next to your concentrator  Incentive spirometry as much as possible   Please schedule a follow up visit in 3 months but call sooner if needed      01/01/2022  post hosp f/u ov/[place] office/Karalyne Nusser re: post covid resp failure 02 dep  maint on 02   Chief Complaint  [facility]   [facility]. Feels breathing is improving since last OV.  2LO2 pulse and 1.5 LO2 at home.    Dyspnea:  walks up to 30 min and stops every 5 min when stops with sats in mid 80's and not titrating as rec  Also rides bike x 15 min  Cough: none / still hoarse but not much different than baseline  Sleeping: < 30 degrees in recliner flatter than he was  SABA use: none  02: 1.5 at rest and sleeping/ 2lpm when walking  Covid status: vax x 2 / and covid infections  rec Keep up the exercise, just make sure you keep your saturations above 90% goal.    04/08/2022  f/u ov/[place] office/Britton Perkinson re: resp failure post covid  maint on 02 24/7   Chief Complaint  Patient presents with   Follow-up    Breathing the same since last OV  2LO2 cont at home  5Lo2 pulse   Dyspnea:  walking 30 min private drive and has to stop 3-4 times / desats on 5lpm does not turn it up to 6 max  Stationery bike not using  Cough: none / swallowing ok  Sleeping: < 30 degrees recliner since covid  SABA use: none  02: 2lpm and up 5 lpm but POC  goes up to 6  Covid status: vax x 2/ infected once  Rec  HRCT c/w post covid changes    Admit date:     05/06/2022  Discharge date: 05/11/22  Discharge Physician: CBarton Dubois   PCP: HCelene Squibb MD    Recommendations at discharge:  Repeat basic metabolic panel to follow electrolytes and renal function Reassess blood pressure and adjust antihypertensive treatment as needed Follow patient response to the use of BiPAP and adjusted oxygen supplementation therapy. Make sure patient follow-up with pulmonologist as instructed.   Discharge Diagnoses: Principal Problem:   Respiratory failure with hypoxia and hypercapnia (HCC) Active Problems:   Pneumonia of both lower lobes due to infectious organism   Acute on  chronic respiratory failure with hypoxia (HCC)   Pulmonary fibrosis (HCC)   Acute on chronic diastolic CHF (congestive heart failure) [facility])     Hospital Course: As per H&P written by Dr. Sloan Leiter on 05/06/2022 GURKARAN RAHM is a 71 y.o. male with medical history significant of chronic hypoxic respiratory failure on 2-3 L of oxygen at home, chronic HFpEF, DM-2, HTN, HLD presented to the hospital from home with shortness of breath x2 days.  Please note-history is limited as patient is on BiPAP-most of this history is obtained after talking with the patient briefly,spouse and by chart review.  Apparently-for the past 2 days or so-patient had shortness of breath-and worsening lower extremity swelling.  He also had a productive cough.  Family noted worsening leg swelling for the past 4 days-and started giving him his as needed Lasix dose more frequently.  Family bumped up his oxygen to 4 L to alleviate his shortness of breath.  Per history-his shortness of breath is mostly worse with exertion.  Due to worsening shortness of breath- EMS was called-apparently his O2 saturations was in the 70s on 4 L-he was subsequently placed on a nonrebreather mask-and brought to the ED.  In the ED-he was  placed on BiPAP.  TRH was asked to consult for further evaluation and treatment   No reported history of fever, chest pain, abdominal pain, nausea vomiting.  There is no history of diarrhea.   Assessment and Plan: Acute on chronic hypoxic and hypercarbic respiratory failure (2-3 L of oxygen at baseline):  -Appears to be multifactorial-from decompensated diastolic heart failure, possible aspiration pneumonia. -Appreciate pulmonology assistance and recommendation -Continue to use BiPAP nightly -Continue diuresis and current oral antibiotics. -Will continue current bronchodilator management and wean off oxygen supplementation towards his baseline as tolerated. -Currently using 3 L nasal cannula supplementation at rest and 4 L nasal cannula supplementation with activity.. -Following pulmonology recommendations prescription for DuoNeb as needed every 6 hours has been provided. -Patient will follow-up with Dr. Melvyn Novas as an outpatient.   Acute on chronic HFpEF:  -Volume status significantly improved -No crackles appreciated on examination; only trace edema appreciated in his lower extremities.  Patient reports breathing back to baseline. -Lasix has been transitioned to oral route with intention to use 40 mg on daily basis. -Low-sodium diet, adequate hydration and daily weights discussed with patient. - 2D echocardiogram with LVEF 55-60% and no other acute abnormalities. -Outpatient follow-up to repeat echocardiogram and if still elevated pulmonary artery pressures will require right heart cath. -REDsClipp ordered; and not exactly accurate. -Outpatient follow-up with cardiology service recommended.   Possible aspiration pneumonia:  -Treatment with the use of Unasyn and subsequent transition to oral Augmentin has been provided.  Afebrile and demonstrating overall improvement in his respiratory status appreciated prior to discharge. -Patient completed 3 days of Zithromax as well to cover for any  atypical infection. -Speech therapy has recommended regular diet with thin liquids. -Continue supportive care.     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   -Oxygen supplementation 3 L at rest and 4 L nasal cannula supplementation required with exertion. -Will check desaturation screening for adjustment on oxygen supplementation with exertion. -Per history-patient apparently was hospitalized for 17 days with COVID-19-and has required oxygen since then.  -Appreciate pulmonology evaluation and will follow recommendations. -Home BiPAP forms has been signed and requested; patient will benefit of the use of BiPAP nightly.  While inpatient so far  tolerating BiPAP at nighttime. -Continue to follow-up with pulmonologist as an outpatient to further adjust medication management and oxygen supplementation.   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  VTE was related to COVID-19 infection.  -CTA chest negative-will be on prophylactic Lovenox.  -Ultrasound performed 6/5 positive for right lower extremity DVT.   -Restarted on Eliquis; anticipating the need for long-term anticoagulation therapy. -Aspirin has been discontinued now that he will be taking Eliquis on daily basis.   DM-2:  -Resume home hypoglycemic regimen -Continue to follow CBGs and further adjust management as needed. -Blood sugar was stable and well controlled.   HTN:  -BP currently appears stable -Continue heart healthy/low-sodium diet. -Lifestyle changes discussed with patient. -Will reassessment of patient blood pressure with further adjustment to antihypertensive regimen as required.   Depression/anxiety:  -Resume home psychiatric medications regimen. -No hallucinations or psychosis. -Stable mood.   History of lung nodule:  -Continue outpatient follow-up  with Dr. Melvyn Novas.     Consultants: Pulmonology Procedures performed: See below for x-ray reports; 2D echo. Disposition: home. Diet recommendation: Heart healthy/low-sodium and modified carbohydrates.            06/06/2022  f/u ov/[place] office/Charles Niese re: post covid resp failure  maint on bipap hs and 02  with new evidence of PH ? Who III vs II vs combination of the 2 Chief Complaint  Patient presents with   Follow-up    Breathing is getting better since switching to cont. O2   Dyspnea:  walking up to 45 min  Cough: none  Sleeping: 30 degrees electric bed  SABA use: neb once a day right after get up  02: 2.5 at rest / bipap hs     No obvious day to day or daytime variability or assoc excess/ purulent sputum or mucus plugs or hemoptysis or cp or chest tightness, subjective wheeze or overt sinus or hb symptoms.   Sleeping as above without nocturnal  or early am exacerbation  of respiratory  c/o's or need for noct saba. Also denies any obvious fluctuation of symptoms with weather or environmental changes or other aggravating or alleviating factors except as outlined above   No unusual exposure hx or h/o childhood pna/ asthma or knowledge of premature birth.  Current Allergies, Complete Past Medical History, Past Surgical History, Family History, and Social History were reviewed in [employer] record.  ROS  The following are not active complaints unless bolded Hoarseness, sore throat, dysphagia, dental problems, itching, sneezing,  nasal congestion or discharge of excess mucus or purulent secretions, ear ache,   fever, chills, sweats, unintended wt loss or wt gain, classically pleuritic or exertional cp,  orthopnea pnd or arm/hand swelling  or leg swelling, presyncope, palpitations, abdominal pain, anorexia, nausea, vomiting, diarrhea  or change in bowel habits or change in bladder habits, change in stools or change in urine, dysuria, hematuria,  rash, arthralgias,  visual complaints, headache, numbness, weakness or ataxia or problems with walking or coordination,  change in mood or  memory.        Current Meds  Medication Sig   ACCU-CHEK GUIDE test strip USE TO TEST TWICE DAILY.D   acetaminophen (TYLENOL) 500 MG tablet Take 1,000 mg by mouth 2 (two) times daily as needed for moderate pain.   alclomethasone (ACLOVATE) 0.05 % cream Apply topically 2 (two) times daily as needed (Rash).   ALPRAZolam (XANAX) 0.25 MG tablet Take 0.25 mg by  mouth daily as needed for anxiety.   apixaban (ELIQUIS) 5 MG TABS tablet Take 1 tablet (5 mg total) by mouth 2 (two) times daily.   ascorbic acid (VITAMIN C) 500 MG tablet Take 1 tablet (500 mg total) by mouth daily.   blood glucose meter kit and supplies KIT Dispense based on patient and insurance preference. Use up to four times daily as directed. (FOR ICD-9 250.00, 250.01).   buPROPion (WELLBUTRIN XL) 150 MG 24 hr tablet Take 150 mg by mouth daily.   Cholecalciferol (VITAMIN D3) 125 MCG (5000 UT) CAPS Take 5,000 Units by mouth daily.   co-enzyme Q-10 50 MG capsule Take 50 mg by mouth 2 (two) times daily.   famotidine (PEPCID) 20 MG tablet TAKE ONE TABLET BY MOUTH DAILY AFTER SUPPER (Patient taking differently: Take 20 mg by mouth at bedtime.)   FIASP FLEXTOUCH 100 UNIT/ML FlexTouch Pen Inject 2-8 Units into the skin every evening.   furosemide (LASIX) 40 MG tablet Take 1 tablet (40 mg total) by mouth daily.   Insulin Pen Needle (PEN NEEDLES) 32G X 4 MM MISC 1 Package by Does not apply route 4 (four) times daily -  before meals and at bedtime.   ipratropium-albuterol (DUONEB) 0.5-2.5 (3) MG/3ML SOLN Take 3 mLs by nebulization every 6 (six) hours as needed (SOB/dizziness.). (Patient taking differently: Take 3 mLs by nebulization 2 (two) times daily.)   JARDIANCE 25 MG TABS tablet Take 25 mg by mouth daily.   Lancets (ONETOUCH DELICA PLUS [id number]) MISC Apply topically 2 (two) times daily.   metFORMIN (GLUCOPHAGE) 1000 MG  tablet Take 1,000 mg by mouth 2 (two) times daily with a meal.   Multiple Vitamin (MULTIVITAMIN) capsule Take 1 capsule by mouth daily.   pantoprazole (PROTONIX) 40 MG tablet Take 40 mg by mouth daily.   Phenylephrine HCl (SINEX REGULAR NA) Place 1 spray into the nose daily as needed (congestion).   potassium chloride SA (KLOR-CON M) 20 MEQ tablet Take 1 tablet (20 mEq total) by mouth daily.   rosuvastatin (CRESTOR) 5 MG tablet Take 5 mg by mouth daily.   sertraline (ZOLOFT) 50 MG tablet Take 100 mg by mouth daily.   tamsulosin (FLOMAX) 0.4 MG CAPS capsule Take 0.4 mg by mouth at bedtime.   TOUJEO MAX SOLOSTAR 300 UNIT/ML Solostar Pen Inject 20 Units into the skin in the morning.   zinc sulfate 220 (50 Zn) MG capsule Take 1 capsule (220 mg total) by mouth daily.                      Past Medical History:  Diagnosis Date   Acute respiratory disease    Atypical mole 12/30/2012   severe left post shoulder tx exc   Candidiasis of urogenital sites    Diabetes mellitus    Diffuse myofascial pain syndrome    Esophageal reflux    Hypertension    Impacted cerumen of right ear    Melanoma ([place]) 06/14/2011   left ear mohs   Mixed hyperlipidemia    MM (malignant melanoma of skin) ([place]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   Primary insomnia    Seborrheic dermatitis, unspecified    Squamous cell carcinoma of skin 06/14/2011   left forearm medial cx3 23fu   Thrombocytopenia, unspecified (HCC)         Objective:    Wt  06/06/2022         179  04/08/2022  193  01/01/2022       182    08/01/2021      189   03/23/21 201 lb (91.2 kg)  02/09/21 200 lb (90.7 kg)  12/27/20 197 lb (89.4 kg)    Vital signs reviewed  06/06/2022  - Note at rest 02 sats  94% on 4lpm cont    General appearance:    chronically ill slt hoarse amb wm who sits with neck flexed      HEENT : Oropharynx  clear      Nasal turbinates nl    NECK :  without  apparent JVD/ palpable Nodes/TM    LUNGS: no  acc muscle use,  Nl contour chest  with minimal insp crackles in bases  bilaterally without cough on insp or exp maneuvers   CV:  RRR  no s3 or murmur or increase in P2, and no edema   ABD:  soft and nontender with nl inspiratory excursion in the supine position. No bruits or organomegaly appreciated   MS:  Nl gait/ ext warm without deformities Or obvious joint restrictions  calf tenderness, cyanosis or clubbing    SKIN: warm and dry with resolving rash arms/ palms/ lower Ext wit pealing dry skin  NEURO:  alert, approp, nl sensorium with  no motor or cerebellar deficits apparent.        I personally reviewed images and agree with radiology impression as follows:   Chest CTa   05/06/22 1. No evidence of acute pulmonary embolism. 2. Increased bibasilar consolidation which could represent aspiration and/or pneumonia. 3. Pulmonary nodules and pulmonary fibrosis    Assessment

## 2022-06-06 NOTE — Patient Instructions (Addendum)
We will refer you to pulmonary rehab at [facility]  Make sure you check your oxygen saturation  AT  your highest level of activity (not after you stop)   to be sure it stays over 90% and adjust  02 flow upward to maintain this level if needed but remember to turn it back to previous settings when you stop (to conserve your supply).    Please schedule a follow up visit in 3 months but call sooner if needed - see Dr Halford Chessman on next visit

## 2022-06-07 ENCOUNTER — Encounter: Payer: Self-pay | Admitting: Internal Medicine

## 2022-06-07 NOTE — Assessment & Plan Note (Signed)
See venous dopplers 08/09/20 pos bilateral dvt with echo 08/26/20 neg for elevated R sided pressures  - repeat venous dopplers 03/29/2021 >>>  No evidence of acute or chronic DVT within either lower extremity. - 10/02/2021 rec repeat venous dopplers since swelling is worse > done 10/05/2021 neg bilaterally - 05/06/22 venous doppler: Nonocclusive DVT of the right profunda femoris vein >>> lifetime DOAC rec

## 2022-06-07 NOTE — Assessment & Plan Note (Addendum)
Onset with covid pna Aug 2021  - desats on RA p 100 ft 02/09/2021 > titrate to sats > 90% using POC max flow of 6lpm  - 10/02/2021 patient walked at a slow pace on 2lpm. During the second lap he reported SOB and desat to 87%. Increased oxygen on second lap to 3lpm POC  and recovered to 92%. - 06/06/2022   Walked on 5lpm cont  x  3  lap(s) =  approx 450  ft  @ mod pace, stopped due to end of study with lowest 02 sats 93  - 06/06/2022 much improved on bipap hs per Dr Halford Chessman > f/u Dr Halford Chessman planned  Advised: Make sure you check your oxygen saturation  AT  your highest level of activity (not after you stop)   to be sure it stays over 90% and adjust  02 flow upward to maintain this level if needed but remember to turn it back to previous settings when you stop (to conserve your supply).   Each maintenance medication was reviewed in detail including emphasizing most importantly the difference between maintenance and prns and under what circumstances the prns are to be triggered using an action plan format where appropriate.  Total time for H and P, chart review, counseling,  directly observing portions of ambulatory 02 saturation study/ and generating customized AVS unique to this office visit / same day charting = 33 min post hosp f/u

## 2022-06-07 NOTE — Assessment & Plan Note (Signed)
Onset of illness 07/22/20 c/b bilateral dvt and L PTX / 02 dep at d/c 09/28/20  -  02/09/2021   Walked RA  approx   100 ft  @ slow pace  stopped due to  desats to 87% corrected to 90% on 1lpm  POC x another 100 ft then desat again   - PFT's  03/07/21   FEV1 1.26 (37 % ) ratio 0.92  p 4 % improvement from saba p 0 prior to study with DLCO  11.40 (46%) corrects to 3.09 (128%)  for alv volume and FV curve nl   - CTa 04/04/21 1. No evidence of pulmonary embolism. 2. Findings suggestive of mild to moderate severity interstitial edema. A superimposed infectious component cannot be excluded. 3. Stable posterior left upper lobe noncalcified lung nodule versus scarring.   - labs 04/09/22   ESR 25 with nl d dimer/ bnp  - HRCT  04/22/22   No change  - findings favor chronic sequelae of prior COVID pneumonia and no additional images needed   Course now complicated by evidence of [place] on last echo with plan f/u  Dr Percival Spanish - likely combination of WHO  2 and 3 and key from pulmonary perspective is adequate 02 sats at all times (see separate a/p)  - no need for steroids or bronchodilators here

## 2022-06-10 DIAGNOSIS — J9611 Chronic respiratory failure with hypoxia: Secondary | ICD-10-CM | POA: Diagnosis not present

## 2022-06-10 DIAGNOSIS — I5040 Unspecified combined systolic (congestive) and diastolic (congestive) heart failure: Secondary | ICD-10-CM | POA: Diagnosis not present

## 2022-06-10 DIAGNOSIS — J8410 Pulmonary fibrosis, unspecified: Secondary | ICD-10-CM | POA: Diagnosis not present

## 2022-06-11 ENCOUNTER — Ambulatory Visit: Payer: HMO | Admitting: Psychiatry

## 2022-06-11 DIAGNOSIS — I5040 Unspecified combined systolic (congestive) and diastolic (congestive) heart failure: Secondary | ICD-10-CM | POA: Diagnosis not present

## 2022-06-11 DIAGNOSIS — J9611 Chronic respiratory failure with hypoxia: Secondary | ICD-10-CM | POA: Diagnosis not present

## 2022-06-11 DIAGNOSIS — J449 Chronic obstructive pulmonary disease, unspecified: Secondary | ICD-10-CM | POA: Diagnosis not present

## 2022-06-19 DIAGNOSIS — D696 Thrombocytopenia, unspecified: Secondary | ICD-10-CM | POA: Diagnosis not present

## 2022-06-19 DIAGNOSIS — Z7984 Long term (current) use of oral hypoglycemic drugs: Secondary | ICD-10-CM | POA: Diagnosis not present

## 2022-06-19 DIAGNOSIS — I82411 Acute embolism and thrombosis of right femoral vein: Secondary | ICD-10-CM | POA: Diagnosis not present

## 2022-06-19 DIAGNOSIS — I5033 Acute on chronic diastolic (congestive) heart failure: Secondary | ICD-10-CM | POA: Diagnosis not present

## 2022-06-19 DIAGNOSIS — Z7901 Long term (current) use of anticoagulants: Secondary | ICD-10-CM | POA: Diagnosis not present

## 2022-06-19 DIAGNOSIS — Z85820 Personal history of malignant melanoma of skin: Secondary | ICD-10-CM | POA: Diagnosis not present

## 2022-06-19 DIAGNOSIS — K219 Gastro-esophageal reflux disease without esophagitis: Secondary | ICD-10-CM | POA: Diagnosis not present

## 2022-06-19 DIAGNOSIS — I110 Hypertensive heart disease with heart failure: Secondary | ICD-10-CM | POA: Diagnosis not present

## 2022-06-19 DIAGNOSIS — F419 Anxiety disorder, unspecified: Secondary | ICD-10-CM | POA: Diagnosis not present

## 2022-06-19 DIAGNOSIS — J9621 Acute and chronic respiratory failure with hypoxia: Secondary | ICD-10-CM | POA: Diagnosis not present

## 2022-06-19 DIAGNOSIS — F5101 Primary insomnia: Secondary | ICD-10-CM | POA: Diagnosis not present

## 2022-06-19 DIAGNOSIS — E782 Mixed hyperlipidemia: Secondary | ICD-10-CM | POA: Diagnosis not present

## 2022-06-19 DIAGNOSIS — Z9181 History of falling: Secondary | ICD-10-CM | POA: Diagnosis not present

## 2022-06-19 DIAGNOSIS — E119 Type 2 diabetes mellitus without complications: Secondary | ICD-10-CM | POA: Diagnosis not present

## 2022-06-19 DIAGNOSIS — Z8616 Personal history of COVID-19: Secondary | ICD-10-CM | POA: Diagnosis not present

## 2022-06-19 DIAGNOSIS — R911 Solitary pulmonary nodule: Secondary | ICD-10-CM | POA: Diagnosis not present

## 2022-06-19 DIAGNOSIS — Z794 Long term (current) use of insulin: Secondary | ICD-10-CM | POA: Diagnosis not present

## 2022-06-19 DIAGNOSIS — Z9981 Dependence on supplemental oxygen: Secondary | ICD-10-CM | POA: Diagnosis not present

## 2022-06-19 DIAGNOSIS — J9622 Acute and chronic respiratory failure with hypercapnia: Secondary | ICD-10-CM | POA: Diagnosis not present

## 2022-06-19 DIAGNOSIS — F32A Depression, unspecified: Secondary | ICD-10-CM | POA: Diagnosis not present

## 2022-06-19 DIAGNOSIS — J181 Lobar pneumonia, unspecified organism: Secondary | ICD-10-CM | POA: Diagnosis not present

## 2022-06-24 DIAGNOSIS — I5033 Acute on chronic diastolic (congestive) heart failure: Secondary | ICD-10-CM | POA: Diagnosis not present

## 2022-06-24 DIAGNOSIS — R0602 Shortness of breath: Secondary | ICD-10-CM | POA: Diagnosis not present

## 2022-06-24 DIAGNOSIS — I825Y9 Chronic embolism and thrombosis of unspecified deep veins of unspecified proximal lower extremity: Secondary | ICD-10-CM | POA: Diagnosis not present

## 2022-06-24 DIAGNOSIS — I272 Pulmonary hypertension, unspecified: Secondary | ICD-10-CM | POA: Diagnosis not present

## 2022-06-24 DIAGNOSIS — I10 Essential (primary) hypertension: Secondary | ICD-10-CM | POA: Diagnosis not present

## 2022-06-25 LAB — BASIC METABOLIC PANEL
BUN/Creatinine Ratio: 23 (ref 10–24)
BUN: 23 mg/dL (ref 8–27)
CO2: 23 mmol/L (ref 20–29)
Calcium: 9.5 mg/dL (ref 8.6–10.2)
Chloride: 99 mmol/L (ref 96–106)
Creatinine, Ser: 1 mg/dL (ref 0.76–1.27)
Glucose: 158 mg/dL — ABNORMAL HIGH (ref 70–99)
Potassium: 4.3 mmol/L (ref 3.5–5.2)
Sodium: 142 mmol/L (ref 134–144)
eGFR: 82 mL/min/{1.73_m2} (ref >59)

## 2022-06-27 ENCOUNTER — Encounter: Payer: Self-pay | Admitting: *Deleted

## 2022-07-10 DIAGNOSIS — J9611 Chronic respiratory failure with hypoxia: Secondary | ICD-10-CM | POA: Diagnosis not present

## 2022-07-10 DIAGNOSIS — J8410 Pulmonary fibrosis, unspecified: Secondary | ICD-10-CM | POA: Diagnosis not present

## 2022-07-10 DIAGNOSIS — I5040 Unspecified combined systolic (congestive) and diastolic (congestive) heart failure: Secondary | ICD-10-CM | POA: Diagnosis not present

## 2022-07-11 DIAGNOSIS — I5040 Unspecified combined systolic (congestive) and diastolic (congestive) heart failure: Secondary | ICD-10-CM | POA: Diagnosis not present

## 2022-07-11 DIAGNOSIS — J9611 Chronic respiratory failure with hypoxia: Secondary | ICD-10-CM | POA: Diagnosis not present

## 2022-07-11 DIAGNOSIS — J8410 Pulmonary fibrosis, unspecified: Secondary | ICD-10-CM | POA: Diagnosis not present

## 2022-07-12 DIAGNOSIS — Z1211 Encounter for screening for malignant neoplasm of colon: Secondary | ICD-10-CM | POA: Diagnosis not present

## 2022-07-24 DIAGNOSIS — I10 Essential (primary) hypertension: Secondary | ICD-10-CM | POA: Diagnosis not present

## 2022-07-24 DIAGNOSIS — L600 Ingrowing nail: Secondary | ICD-10-CM | POA: Diagnosis not present

## 2022-07-24 DIAGNOSIS — L989 Disorder of the skin and subcutaneous tissue, unspecified: Secondary | ICD-10-CM | POA: Diagnosis not present

## 2022-07-24 DIAGNOSIS — E1165 Type 2 diabetes mellitus with hyperglycemia: Secondary | ICD-10-CM | POA: Diagnosis not present

## 2022-07-24 DIAGNOSIS — E785 Hyperlipidemia, unspecified: Secondary | ICD-10-CM | POA: Diagnosis not present

## 2022-07-30 DIAGNOSIS — E782 Mixed hyperlipidemia: Secondary | ICD-10-CM | POA: Diagnosis not present

## 2022-07-30 DIAGNOSIS — I10 Essential (primary) hypertension: Secondary | ICD-10-CM | POA: Diagnosis not present

## 2022-07-30 DIAGNOSIS — M7989 Other specified soft tissue disorders: Secondary | ICD-10-CM | POA: Diagnosis not present

## 2022-07-30 DIAGNOSIS — M21371 Foot drop, right foot: Secondary | ICD-10-CM | POA: Diagnosis not present

## 2022-07-30 DIAGNOSIS — J9621 Acute and chronic respiratory failure with hypoxia: Secondary | ICD-10-CM | POA: Diagnosis not present

## 2022-07-30 DIAGNOSIS — M542 Cervicalgia: Secondary | ICD-10-CM | POA: Diagnosis not present

## 2022-07-30 DIAGNOSIS — R911 Solitary pulmonary nodule: Secondary | ICD-10-CM | POA: Diagnosis not present

## 2022-07-30 DIAGNOSIS — I5042 Chronic combined systolic (congestive) and diastolic (congestive) heart failure: Secondary | ICD-10-CM | POA: Diagnosis not present

## 2022-07-30 DIAGNOSIS — J9611 Chronic respiratory failure with hypoxia: Secondary | ICD-10-CM | POA: Diagnosis not present

## 2022-07-30 DIAGNOSIS — E118 Type 2 diabetes mellitus with unspecified complications: Secondary | ICD-10-CM | POA: Diagnosis not present

## 2022-07-30 DIAGNOSIS — R809 Proteinuria, unspecified: Secondary | ICD-10-CM | POA: Diagnosis not present

## 2022-07-30 DIAGNOSIS — F418 Other specified anxiety disorders: Secondary | ICD-10-CM | POA: Diagnosis not present

## 2022-08-02 ENCOUNTER — Other Ambulatory Visit (HOSPITAL_COMMUNITY): Payer: Self-pay | Admitting: Internal Medicine

## 2022-08-02 ENCOUNTER — Ambulatory Visit (HOSPITAL_COMMUNITY)
Admission: RE | Admit: 2022-08-02 | Discharge: 2022-08-02 | Disposition: A | Discharge status: Home / Self Care | Payer: HMO | Source: Ambulatory Visit | Attending: Internal Medicine | Admitting: Internal Medicine

## 2022-08-02 DIAGNOSIS — R0600 Dyspnea, unspecified: Secondary | ICD-10-CM | POA: Diagnosis not present

## 2022-08-11 DIAGNOSIS — J9611 Chronic respiratory failure with hypoxia: Secondary | ICD-10-CM | POA: Diagnosis not present

## 2022-08-11 DIAGNOSIS — I5040 Unspecified combined systolic (congestive) and diastolic (congestive) heart failure: Secondary | ICD-10-CM | POA: Diagnosis not present

## 2022-08-11 DIAGNOSIS — J8410 Pulmonary fibrosis, unspecified: Secondary | ICD-10-CM | POA: Diagnosis not present

## 2022-08-21 DIAGNOSIS — Z23 Encounter for immunization: Secondary | ICD-10-CM | POA: Diagnosis not present

## 2022-08-21 DIAGNOSIS — I10 Essential (primary) hypertension: Secondary | ICD-10-CM | POA: Diagnosis not present

## 2022-08-21 DIAGNOSIS — J9611 Chronic respiratory failure with hypoxia: Secondary | ICD-10-CM | POA: Diagnosis not present

## 2022-08-21 DIAGNOSIS — J189 Pneumonia, unspecified organism: Secondary | ICD-10-CM | POA: Diagnosis not present

## 2022-08-22 ENCOUNTER — Other Ambulatory Visit: Payer: Self-pay | Admitting: Podiatry

## 2022-08-22 ENCOUNTER — Other Ambulatory Visit (HOSPITAL_COMMUNITY): Payer: Self-pay | Admitting: Podiatry

## 2022-08-22 DIAGNOSIS — E1151 Type 2 diabetes mellitus with diabetic peripheral angiopathy without gangrene: Secondary | ICD-10-CM

## 2022-08-26 ENCOUNTER — Ambulatory Visit (HOSPITAL_COMMUNITY)
Admission: RE | Admit: 2022-08-26 | Discharge: 2022-08-26 | Disposition: A | Discharge status: Home / Self Care | Payer: HMO | Source: Ambulatory Visit | Attending: Podiatry | Admitting: Podiatry

## 2022-08-26 DIAGNOSIS — I739 Peripheral vascular disease, unspecified: Secondary | ICD-10-CM | POA: Diagnosis not present

## 2022-08-26 DIAGNOSIS — E1151 Type 2 diabetes mellitus with diabetic peripheral angiopathy without gangrene: Secondary | ICD-10-CM | POA: Insufficient documentation

## 2022-08-28 DIAGNOSIS — Z6825 Body mass index (BMI) 25.0-25.9, adult: Secondary | ICD-10-CM | POA: Diagnosis not present

## 2022-08-28 DIAGNOSIS — E663 Overweight: Secondary | ICD-10-CM | POA: Diagnosis not present

## 2022-08-28 DIAGNOSIS — R21 Rash and other nonspecific skin eruption: Secondary | ICD-10-CM | POA: Diagnosis not present

## 2022-08-28 DIAGNOSIS — M1991 Primary osteoarthritis, unspecified site: Secondary | ICD-10-CM | POA: Diagnosis not present

## 2022-08-28 DIAGNOSIS — M2560 Stiffness of unspecified joint, not elsewhere classified: Secondary | ICD-10-CM | POA: Diagnosis not present

## 2022-08-28 DIAGNOSIS — R768 Other specified abnormal immunological findings in serum: Secondary | ICD-10-CM | POA: Diagnosis not present

## 2022-08-28 DIAGNOSIS — R0600 Dyspnea, unspecified: Secondary | ICD-10-CM | POA: Diagnosis not present

## 2022-08-29 DIAGNOSIS — X32XXXA Exposure to sunlight, initial encounter: Secondary | ICD-10-CM | POA: Diagnosis not present

## 2022-08-29 DIAGNOSIS — D225 Melanocytic nevi of trunk: Secondary | ICD-10-CM | POA: Diagnosis not present

## 2022-08-29 DIAGNOSIS — L820 Inflamed seborrheic keratosis: Secondary | ICD-10-CM | POA: Diagnosis not present

## 2022-08-29 DIAGNOSIS — Z1283 Encounter for screening for malignant neoplasm of skin: Secondary | ICD-10-CM | POA: Diagnosis not present

## 2022-08-29 DIAGNOSIS — L570 Actinic keratosis: Secondary | ICD-10-CM | POA: Diagnosis not present

## 2022-08-30 DIAGNOSIS — E785 Hyperlipidemia, unspecified: Secondary | ICD-10-CM | POA: Diagnosis not present

## 2022-08-30 DIAGNOSIS — Z9981 Dependence on supplemental oxygen: Secondary | ICD-10-CM | POA: Diagnosis not present

## 2022-08-30 DIAGNOSIS — J449 Chronic obstructive pulmonary disease, unspecified: Secondary | ICD-10-CM | POA: Diagnosis not present

## 2022-08-30 DIAGNOSIS — Z794 Long term (current) use of insulin: Secondary | ICD-10-CM | POA: Diagnosis not present

## 2022-08-30 DIAGNOSIS — Z7984 Long term (current) use of oral hypoglycemic drugs: Secondary | ICD-10-CM | POA: Diagnosis not present

## 2022-08-30 DIAGNOSIS — I509 Heart failure, unspecified: Secondary | ICD-10-CM | POA: Diagnosis not present

## 2022-08-30 DIAGNOSIS — E1169 Type 2 diabetes mellitus with other specified complication: Secondary | ICD-10-CM | POA: Diagnosis not present

## 2022-08-30 DIAGNOSIS — J8410 Pulmonary fibrosis, unspecified: Secondary | ICD-10-CM | POA: Diagnosis not present

## 2022-08-30 DIAGNOSIS — J9611 Chronic respiratory failure with hypoxia: Secondary | ICD-10-CM | POA: Diagnosis not present

## 2022-08-30 DIAGNOSIS — E1159 Type 2 diabetes mellitus with other circulatory complications: Secondary | ICD-10-CM | POA: Diagnosis not present

## 2022-08-30 DIAGNOSIS — I5040 Unspecified combined systolic (congestive) and diastolic (congestive) heart failure: Secondary | ICD-10-CM | POA: Diagnosis not present

## 2022-08-30 DIAGNOSIS — Z6825 Body mass index (BMI) 25.0-25.9, adult: Secondary | ICD-10-CM | POA: Diagnosis not present

## 2022-08-31 DIAGNOSIS — E1165 Type 2 diabetes mellitus with hyperglycemia: Secondary | ICD-10-CM | POA: Diagnosis not present

## 2022-08-31 DIAGNOSIS — I10 Essential (primary) hypertension: Secondary | ICD-10-CM | POA: Diagnosis not present

## 2022-08-31 DIAGNOSIS — I5042 Chronic combined systolic (congestive) and diastolic (congestive) heart failure: Secondary | ICD-10-CM | POA: Diagnosis not present

## 2022-08-31 DIAGNOSIS — E782 Mixed hyperlipidemia: Secondary | ICD-10-CM | POA: Diagnosis not present

## 2022-09-05 ENCOUNTER — Ambulatory Visit: Payer: HMO | Admitting: Pulmonary Disease

## 2022-09-05 ENCOUNTER — Encounter: Payer: Self-pay | Admitting: Pulmonary Disease

## 2022-09-05 VITALS — BP 134/76 | HR 80 | Temp 97.4°F | Ht 70.0 in | Wt 177.8 lb

## 2022-09-05 DIAGNOSIS — Z8616 Personal history of COVID-19: Secondary | ICD-10-CM | POA: Diagnosis not present

## 2022-09-05 DIAGNOSIS — J9612 Chronic respiratory failure with hypercapnia: Secondary | ICD-10-CM

## 2022-09-05 DIAGNOSIS — J8410 Pulmonary fibrosis, unspecified: Secondary | ICD-10-CM

## 2022-09-05 DIAGNOSIS — J9611 Chronic respiratory failure with hypoxia: Secondary | ICD-10-CM | POA: Diagnosis not present

## 2022-09-05 NOTE — Patient Instructions (Signed)
Will call with results of Bipap report  Follow up in 6 months

## 2022-09-05 NOTE — Progress Notes (Signed)
[facility]  Chief Complaint  Patient presents with   Consult    Follow up with Dr. Halford Chessman for bipap per MW.  Patient will bring SD card by office later today for reading     Past Surgical History:  He  has a past surgical history that includes Shoulder surgery (2008); Ankle fracture surgery; Abdominal exploration surgery; Colonoscopy (07/13/2012); left heart catheterization with coronary angiogram (N/A, 01/20/2015); IR Perc Cholecystostomy (04/10/2021); IR Radiologist Eval & Mgmt (05/08/2021); IR EXCHANGE BILIARY DRAIN (05/16/2021); IR EXCHANGE BILIARY DRAIN (05/24/2021); IR REMOVAL OF CALCULI/DEBRIS BILIARY DUCT/GB (05/28/2021); IR EXCHANGE BILIARY DRAIN (05/28/2021); IR REMOVAL OF CALCULI/DEBRIS BILIARY DUCT/GB (06/25/2021); IR EXCHANGE BILIARY DRAIN (06/28/2021); IR Radiologist Eval & Mgmt (07/05/2021); IR Radiologist Eval & Mgmt (07/19/2021); IR CHOLANGIOGRAM EXISTING TUBE (08/30/2021); and IR Sinus/Fist Tube Chk-Non GI (08/20/2021).  Past Medical History:  Diastolic CHF, DM type 2, HLD, HTN, Chronic pain, GERD, Melanoma, Insomnia, DVT, COVID pneumonia September 2021, Lt pneumothorax September 2021  Constitutional:  BP 134/76   Pulse 80   Temp (!) 97.4 F (36.3 C) (Temporal)   Ht '5\' 10"'  (1.778 m)   Wt 177 lb 12.8 oz (80.6 kg)   SpO2 94% Comment: 2.5 LO@ cont  BMI 25.51 kg/m   Brief Summary:  Tim Day is a 71 y.o. 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      Subjective:   He is here with his wife.  Uses 1 to 1.5 liters oxygen at home.  Uses Bipap.  Has full face mask.  Gets dry mouth.  Not having cough, wheeze, or sputum.  Doesn't feel like albuterol helps much.  Physical Exam:   Appearance - well kempt, wearing oxygen  ENMT - no sinus tenderness, no oral exudate, no LAN, Mallampati 3 airway, no stridor  Respiratory - faint crackles  CV - s1s2 regular rate and rhythm, no murmurs  Ext - no  clubbing, no edema  Skin - no rashes  Psych - normal mood and affect   Pulmonary testing:  PFT 03/07/21 >> FEV1 1.32 (39%), FEV1% 92, TLC 2.13 (30%), DLCO 43% ABG 05/07/22 >> pH 7.33, PCO2 84, PO2 63  Chest Imaging:  HRCT chest 04/24/22 >> nodular cluster in Lt lung base up to 1.5 cm, mod pulmonary fibrosis (post-COVID pattern)  Sleep Tests:    Cardiac Tests:  Echo 05/07/22 >> EF 55 to 60%, mod RV enlargement, severe elevation in PASP  Social History:  He  reports that he has never smoked. He has never used smokeless tobacco. He reports that he does not drink alcohol and does not use drugs.  Family History:  His family history includes CAD in his father; Diabetes in his mother.     Assessment/Plan:   Chronic hypoxic/hypercapnic respiratory failure. - in setting of post-COVID pulmonary fibrosis - goal SpO2 > 90% - will call him with results of Bipap download and see if his settings can be decreased to help with mouth dryness - discussed pros/cons of RSV vaccination - advised her could switch albuterol to prn  Pulmonary fibrosis, Lung nodules. - follow up with Dr. Melvyn Novas   Time Spent Involved in Patient Care on Day of Examination:  26 minutes  Follow up:   Patient Instructions  Will call with results of Bipap report  Follow up in 6 months  Medication List:   Allergies as of 09/05/2022   No Known Allergies      Medication List  Accurate as of September 05, 2022 10:49 AM. If you have any questions, ask your nurse or doctor.          STOP taking these medications    Jardiance 25 MG Tabs tablet Generic drug: empagliflozin Stopped by: Chesley Mires, MD   metFORMIN 1000 MG tablet Commonly known as: GLUCOPHAGE Stopped by: Chesley Mires, MD       TAKE these medications    Accu-Chek Guide test strip Generic drug: glucose blood USE TO TEST TWICE DAILY.D   acetaminophen 500 MG tablet Commonly known as: TYLENOL Take 1,000 mg by mouth 2 (two) times  daily as needed for moderate pain.   alclomethasone 0.05 % cream Commonly known as: ACLOVATE Apply topically 2 (two) times daily as needed (Rash).   ALPRAZolam 0.25 MG tablet Commonly known as: XANAX Take 0.25 mg by mouth daily as needed for anxiety.   apixaban 5 MG Tabs tablet Commonly known as: ELIQUIS Take 1 tablet (5 mg total) by mouth 2 (two) times daily.   ascorbic acid 500 MG tablet Commonly known as: VITAMIN C Take 1 tablet (500 mg total) by mouth daily.   blood glucose meter kit and supplies Kit Dispense based on patient and insurance preference. Use up to four times daily as directed. (FOR ICD-9 250.00, 250.01).   buPROPion 150 MG 24 hr tablet Commonly known as: WELLBUTRIN XL Take 150 mg by mouth daily.   co-enzyme Q-10 50 MG capsule Take 50 mg by mouth 2 (two) times daily.   famotidine 20 MG tablet Commonly known as: PEPCID TAKE ONE TABLET BY MOUTH DAILY AFTER SUPPER What changed: See the new instructions.   Fiasp FlexTouch 100 UNIT/ML FlexTouch Pen Generic drug: insulin aspart Inject 2-8 Units into the skin every evening.   furosemide 40 MG tablet Commonly known as: LASIX Take 1 tablet (40 mg total) by mouth daily.   ipratropium-albuterol 0.5-2.5 (3) MG/3ML Soln Commonly known as: DUONEB Take 3 mLs by nebulization every 6 (six) hours as needed (SOB/dizziness.). What changed: when to take this   multivitamin capsule Take 1 capsule by mouth daily.   OneTouch Delica Plus [id number] Misc Apply topically 2 (two) times daily.   pantoprazole 40 MG tablet Commonly known as: PROTONIX Take 40 mg by mouth daily.   Pen Needles 32G X 4 MM Misc 1 Package by Does not apply route 4 (four) times daily -  before meals and at bedtime.   potassium chloride SA 20 MEQ tablet Commonly known as: KLOR-CON M Take 1 tablet (20 mEq total) by mouth daily.   rosuvastatin 5 MG tablet Commonly known as: CRESTOR Take 5 mg by mouth daily.   sertraline 50 MG  tablet Commonly known as: ZOLOFT Take 100 mg by mouth daily.   SINEX REGULAR NA Place 1 spray into the nose daily as needed (congestion).   tamsulosin 0.4 MG Caps capsule Commonly known as: FLOMAX Take 0.4 mg by mouth at bedtime.   Toujeo Max SoloStar 300 UNIT/ML Solostar Pen Generic drug: insulin glargine (2 Unit Dial) Inject 20 Units into the skin in the morning.   Vitamin D3 125 MCG (5000 UT) Caps Take 5,000 Units by mouth daily.   zinc sulfate 220 (50 Zn) MG capsule Take 1 capsule (220 mg total) by mouth daily.        Signature:  Chesley Mires, MD [place] Pager - [phone] 09/05/2022, 10:49 AM

## 2022-09-06 ENCOUNTER — Telehealth: Payer: Self-pay | Admitting: Pulmonary Disease

## 2022-09-06 DIAGNOSIS — G4733 Obstructive sleep apnea (adult) (pediatric): Secondary | ICD-10-CM

## 2022-09-06 NOTE — Telephone Encounter (Signed)
Auto Bipap 06/08/22 to 09/05/22 >> used on 90 of 90 nights with average 5 hrs 21 min.  Average AHI 0.6 with median Bipap 10/6 and 95 th percentile Bipap 11/7 cm H2O.  Please let him know his report shows good control of his sleep apnea.  He is having mask leak.  If this is causing trouble with his sleep, then please send order to get mask refit.  If this isn't causing trouble, then nothing additional needed at this time.

## 2022-09-06 NOTE — Telephone Encounter (Signed)
Called and notified patient of results. He voiced understanding. Order placed to have mask refit  Nothing further needed at this time.

## 2022-09-06 NOTE — Telephone Encounter (Signed)
      ATC patients wife. LVM letting her know SD card is ready for pick up

## 2022-09-10 DIAGNOSIS — J8410 Pulmonary fibrosis, unspecified: Secondary | ICD-10-CM | POA: Diagnosis not present

## 2022-09-10 DIAGNOSIS — J9611 Chronic respiratory failure with hypoxia: Secondary | ICD-10-CM | POA: Diagnosis not present

## 2022-09-10 DIAGNOSIS — E119 Type 2 diabetes mellitus without complications: Secondary | ICD-10-CM | POA: Diagnosis not present

## 2022-09-10 DIAGNOSIS — I5040 Unspecified combined systolic (congestive) and diastolic (congestive) heart failure: Secondary | ICD-10-CM | POA: Diagnosis not present

## 2022-09-12 ENCOUNTER — Ambulatory Visit: Payer: HMO | Admitting: Physician Assistant

## 2022-09-19 ENCOUNTER — Ambulatory Visit (HOSPITAL_COMMUNITY): Payer: HMO | Attending: Cardiology

## 2022-09-19 DIAGNOSIS — I5033 Acute on chronic diastolic (congestive) heart failure: Secondary | ICD-10-CM | POA: Diagnosis not present

## 2022-09-19 DIAGNOSIS — I825Y9 Chronic embolism and thrombosis of unspecified deep veins of unspecified proximal lower extremity: Secondary | ICD-10-CM | POA: Diagnosis not present

## 2022-09-19 DIAGNOSIS — I272 Pulmonary hypertension, unspecified: Secondary | ICD-10-CM | POA: Diagnosis present

## 2022-09-19 DIAGNOSIS — I10 Essential (primary) hypertension: Secondary | ICD-10-CM | POA: Diagnosis not present

## 2022-09-19 DIAGNOSIS — R0602 Shortness of breath: Secondary | ICD-10-CM | POA: Diagnosis present

## 2022-09-19 LAB — ECHOCARDIOGRAM COMPLETE
Area-P 1/2: 3.2 cm2
S' Lateral: 2.8 cm

## 2022-09-19 MED ORDER — PERFLUTREN LIPID MICROSPHERE
1.0000 mL | INTRAVENOUS | Status: AC | PRN
  Administered 2022-09-19: 1 mL via INTRAVENOUS
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
## 2022-09-26 ENCOUNTER — Ambulatory Visit: Payer: HMO | Attending: Cardiology | Admitting: Cardiology

## 2022-09-26 ENCOUNTER — Encounter: Payer: Self-pay | Admitting: Cardiology

## 2022-09-26 VITALS — BP 100/78 | HR 79 | Ht 70.0 in | Wt 180.2 lb

## 2022-09-26 DIAGNOSIS — I82403 Acute embolism and thrombosis of unspecified deep veins of lower extremity, bilateral: Secondary | ICD-10-CM | POA: Diagnosis not present

## 2022-09-26 DIAGNOSIS — I272 Pulmonary hypertension, unspecified: Secondary | ICD-10-CM

## 2022-09-26 DIAGNOSIS — I10 Essential (primary) hypertension: Secondary | ICD-10-CM | POA: Diagnosis not present

## 2022-09-26 DIAGNOSIS — I5032 Chronic diastolic (congestive) heart failure: Secondary | ICD-10-CM | POA: Diagnosis not present

## 2022-09-26 NOTE — Patient Instructions (Signed)
Medication Instructions:  Your physician recommends that you continue on your current medications as directed. Please refer to the Current Medication list given to you today.  *If you need a refill on your cardiac medications before your next appointment, please call your pharmacy*   Follow-Up: At [facility], you and your health needs are our priority.  As part of our continuing mission to provide you with exceptional heart care, we have created designated Provider Care Teams.  These Care Teams include your primary Cardiologist (physician) and Advanced Practice Providers (APPs -  Physician Assistants and Nurse Practitioners) who all work together to provide you with the care you need, when you need it.  We recommend signing up for the patient portal called "MyChart".  Sign up information is provided on this After Visit Summary.  MyChart is used to connect with patients for Virtual Visits (Telemedicine).  Patients are able to view lab/test results, encounter notes, upcoming appointments, etc.  Non-urgent messages can be sent to your provider as well.   To learn more about what you can do with MyChart, go to [email or web address].    Your next appointment:   We will see you on an as needed basis.   Provider:   Minus Breeding, MD

## 2022-10-02 IMAGING — DX DG CHEST 2V
2 series · 2 of 2 positions shown · non-contrast
Comparison: 02/09/2021 chest radiograph.

CLINICAL DATA: Persistent cough and dyspnea, FFKPZ-QP 7 months
prior

EXAM:
CHEST - 2 VIEW

[chest pa]
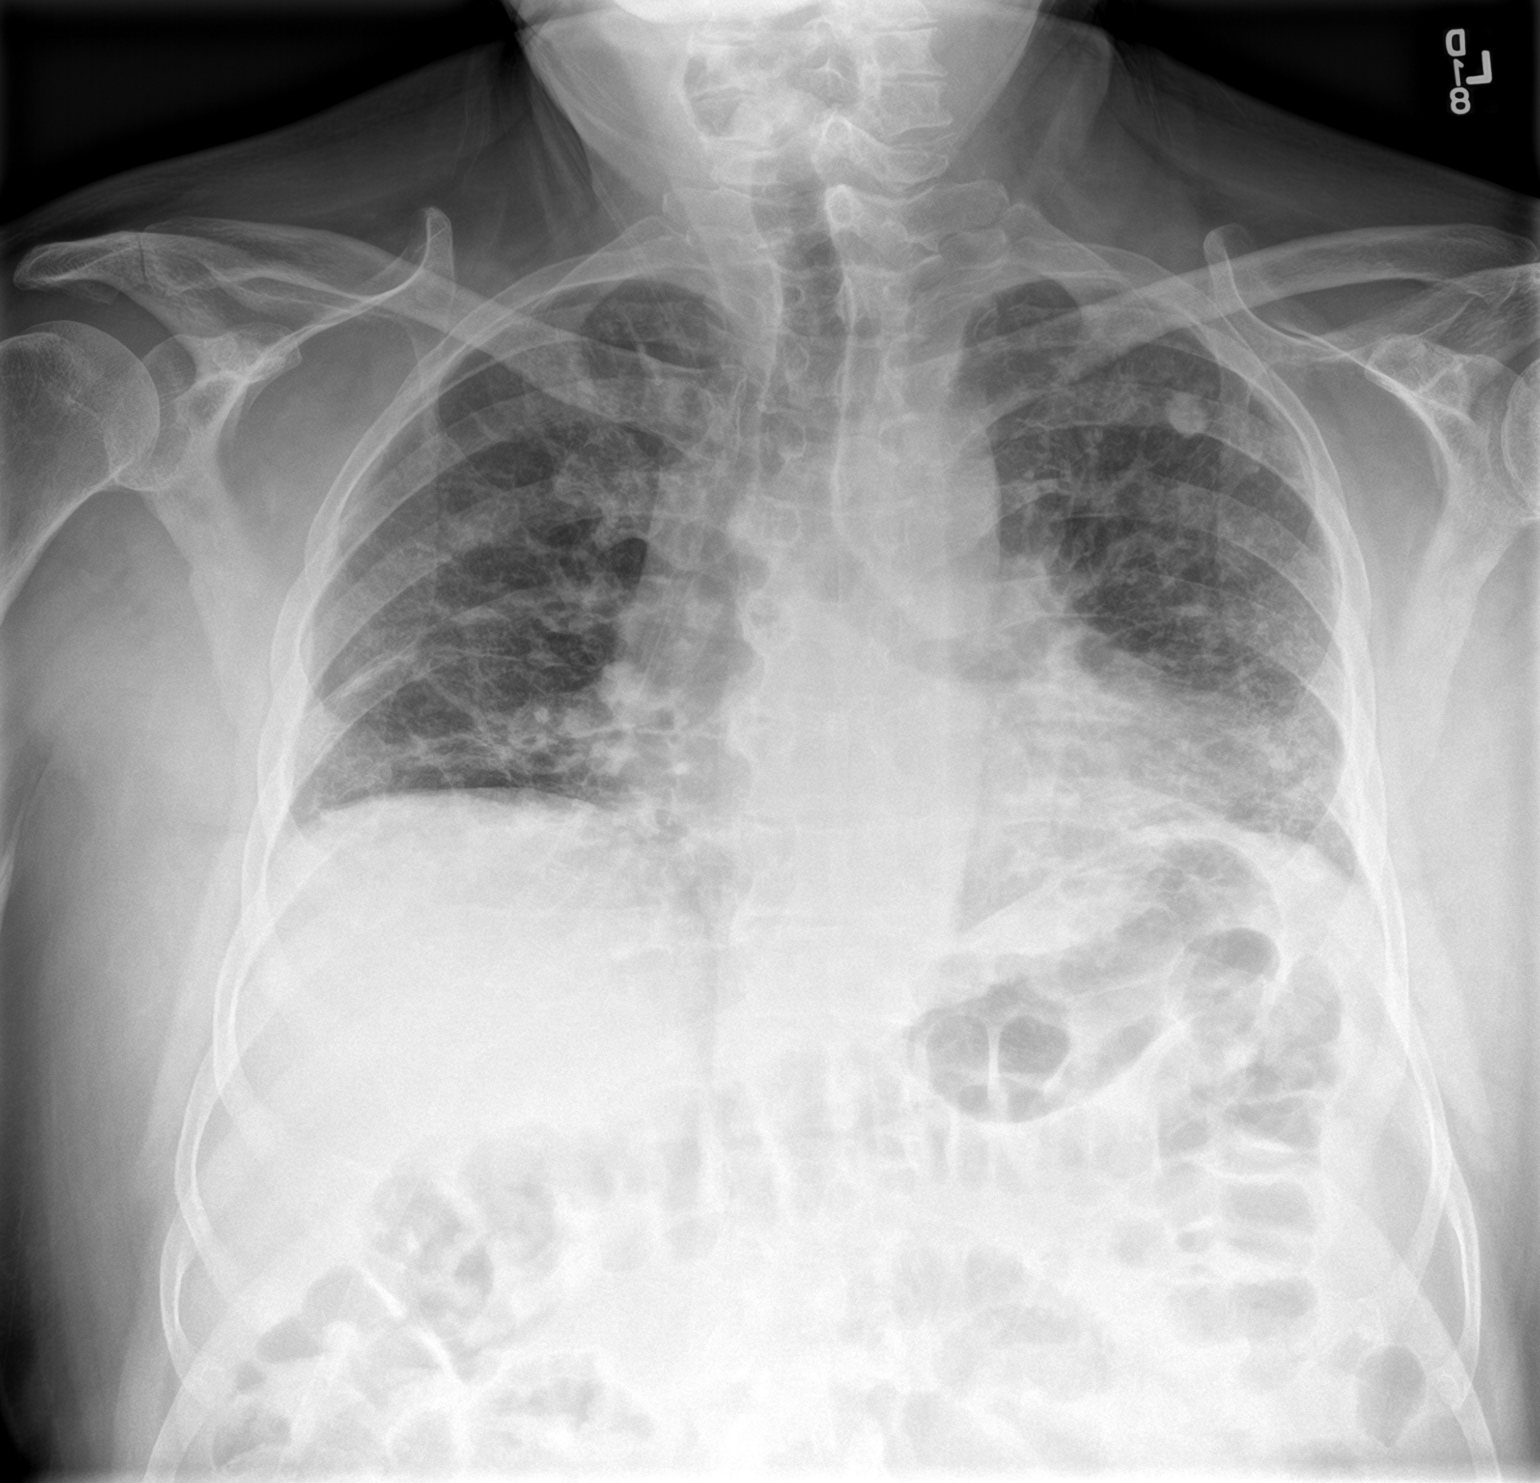

[chest lat]
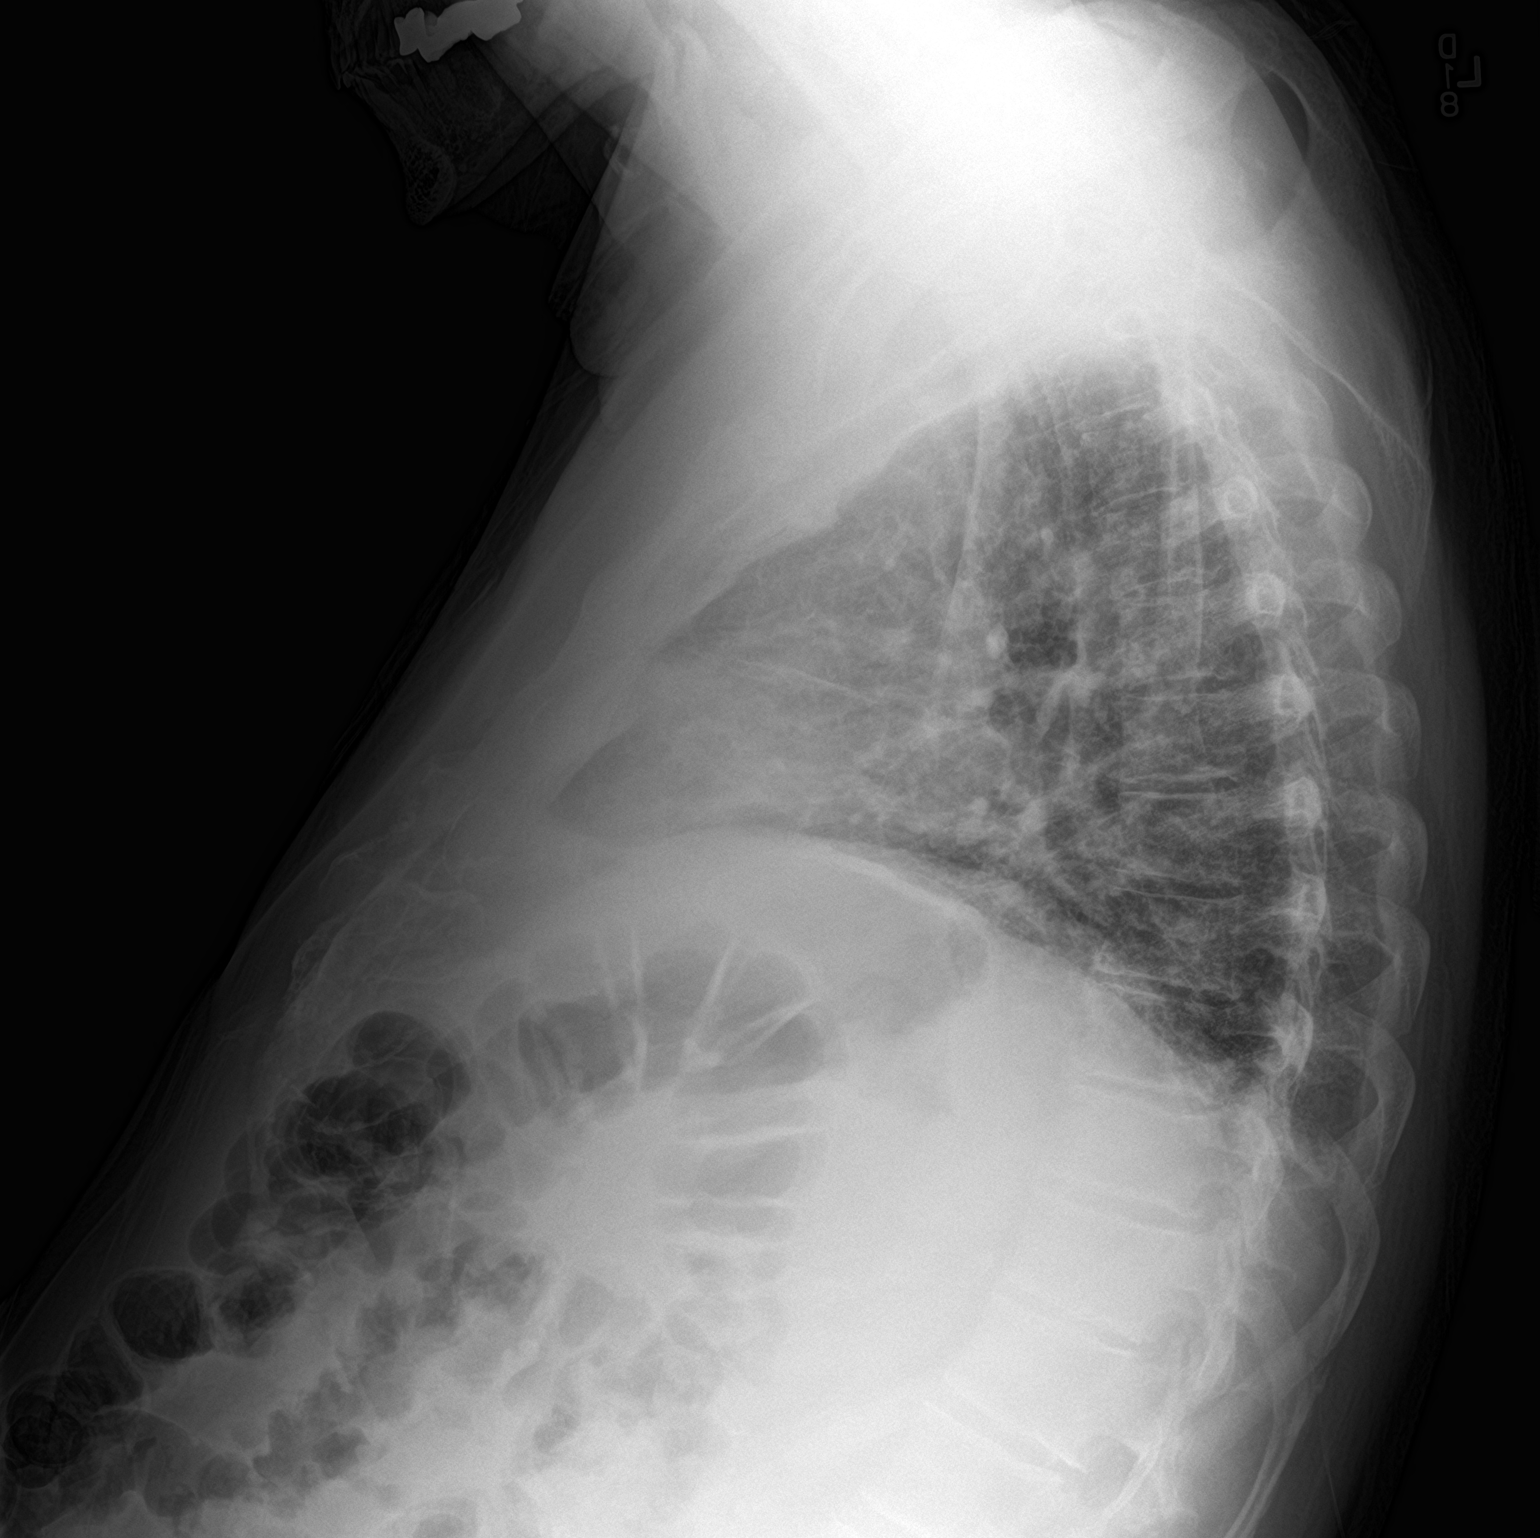

[2 of 2 positions shown; findings below may reference images not displayed]

FINDINGS: Low lung volumes, similar. Stable cardiomediastinal silhouette with
normal heart size. No pneumothorax. No pleural effusion. Left upper
lung 1.1 cm nodule is unchanged from 02/09/2021 chest radiograph,
not previously visualized prior to that radiograph. Patchy reticular
opacities in both lungs, most prominent in the lower lungs, similar.
No superimposed acute consolidative airspace disease.
IMPRESSION: Persistent 1.1 cm left upper lung nodule, unchanged from 02/09/2021
chest radiograph, not visualized prior to that chest radiograph,
suspicious for a solid pulmonary nodule. Patchy reticular opacities
in both lungs, most prominent in the lower lungs, similar. Given the
history of prior FFKPZ-QP infection, findings could represent
postinflammatory fibrosis. High-resolution chest CT recommended to
evaluate for left upper lobe pulmonary nodule and to characterize
the suspected postinflammatory fibrosis.

## 2022-10-07 ENCOUNTER — Encounter: Payer: Self-pay | Admitting: Internal Medicine

## 2022-10-07 ENCOUNTER — Ambulatory Visit: Payer: HMO | Admitting: Internal Medicine

## 2022-10-07 VITALS — BP 136/82 | HR 84 | Temp 98.4°F | Ht 70.0 in | Wt 180.0 lb

## 2022-10-07 DIAGNOSIS — J9611 Chronic respiratory failure with hypoxia: Secondary | ICD-10-CM | POA: Diagnosis not present

## 2022-10-07 DIAGNOSIS — U071 COVID-19: Secondary | ICD-10-CM | POA: Diagnosis not present

## 2022-10-07 DIAGNOSIS — J9612 Chronic respiratory failure with hypercapnia: Secondary | ICD-10-CM

## 2022-10-07 DIAGNOSIS — J300 Vasomotor rhinitis: Secondary | ICD-10-CM | POA: Diagnosis not present

## 2022-10-07 MED ORDER — IPRATROPIUM BROMIDE 0.06 % NA SOLN: 2.0000 | Freq: Four times a day (QID) | NASAL | 12 refills | Status: DC

## 2022-10-07 NOTE — Progress Notes (Signed)
[place], male    DOB: 1951-01-22    MRN: [id number]   Brief patient profile:  79  yowm never smoker with DM then covid Jul 28 2020   Admit date: 08/19/2020 Discharge date: 09/28/2020   Recommendations for Outpatient Follow-up:  Follow-up urology in 2 weeks for voiding trial Patient will be discharged with Foley catheter Continue oxygen 2 to 3 L/min via nasal cannula, wean off as tolerated Follow-up with ophthalmology as outpatient   Discharge Diagnoses:  Active Problems:   DM2 (diabetes mellitus, type 2) (HCC)   Benign essential HTN   Dyslipidemia   Chest tube in place   COVID-19 virus infection   Acute hypoxemic respiratory failure due to COVID-19 [facility])   Pneumothorax, left   Pneumonia due to COVID-19 virus   Leg DVT (deep venous thromboembolism), acute, bilateral (HCC)   Pressure injury of skin   Acute respiratory failure with hypoxia (HCC)   HCAP (healthcare-associated pneumonia)   Primary spontaneous pneumothorax   Protein-calorie malnutrition, severe   Adjustment disorder with anxious mood      History of present illness:  71 year old male with recent Covid pneumonia, 08/08/2020 -  3/81/8299, complicated by bilateral DVTs was admitted 08/19/2020 with left pneumothorax requiring chest tube placement and subsequent intubation 08/20/2020.  He was extubated following day but had persistent bilateral infiltrates requiring high flow nasal cannula 10 to 15 L/min oxygen.  Hospital course complicated by urinary retention, Foley catheter was inserted.  Also had episode of sudden loss of vision in right eye which has now resolved.  Neurology work-up was negative.   Hospital Course:  Acute hypoxemic and hypercarbic respiratory failure-secondary to recent COVID-19 pneumonia, s/p left pneumothorax.  Wean down to 2 L nasal cannula oxygen, continue to wean off as tolerated.  Continue to wean prednisone.  Will discharge on prednisone taper for 5 more days.  Continue as needed  albuterol inhaler. Acute urine retention-started on tamsulosin, Foley catheter.  Failed voiding trial x2.    Urology recommends to discharge patient with Foley catheter. Urology will see patient as outpatient. Bilateral DVTs- continue Eliquis. Diabetes mellitus type 2-continue Lantus, sliding scale insulin with NovoLog, linagliptin.  CBG well controlled. Depression/anxiety-continue Ativan as needed Sudden unilateral vision loss-resolved, patient had episode of sudden loss of vision right eye on 09/07/2020, he was unable to see anything for few seconds only, later on his vision came back but it remained blurry for 15 to 20 minutes.  CT head without contrast was negative.  MRI was unremarkable.  Bilateral carotid duplex was unremarkable.  Discussed with patient and his wife, he needs to follow-up with ophthalmology as outpatient. Left eye subconjunctival hemorrhage-stable       History of Present Illness  02/09/2021  Pulmonary/ 1st office eval/Delane Wessinger  Chief Complaint  Patient presents with   Consult    Shortness of breath with a lot of exertion  Dyspnea:  Walks x 10 laps = maybe 6-8 min at slow pace at building / walks s 02 with  sats 86% Baseline faster walk s 02  Cough: some pnds mucus is clear Sleep: has sleep number bed but still in incliner with 30 degrees fine  SABA use: not helping 02  1.5 hs conc and prn daytime but not titrating  rec Continue pantoprazole 40 mg Take 30-60 min before first meal of the day and add pepcid 20 mg after supper  GERD No need for inhalers  Make sure you check your oxygen saturation  at your highest level  of activity  to be sure it stays over 90% and adjust  02 flow upward to maintain this level if needed but remember to turn it back to previous settings when you stop (to conserve your supply).  Please schedule a follow up office visit in 6 weeks, call sooner if needed with PFT's on return     10/02/2021  f/u ov/[place] office/Dioselina Brumbaugh re: post covid pna  resp failure   Chief Complaint  Patient presents with   Follow-up    From lobby to room pt O2 sats are at 87% on 3lpm. After sitting O2 sats increased to 92% 3lpm   Sob  and cough are the same as last visit per patient.   Dyspnea:  walking indoors x  48mn on 4lpm pulsed /  Cough: min mucoid Sleeping: recliner 30 degrees/ 2lpm conc SABA use: none  02: 2lpm and 4 lpm walking  Covid status: 2 total vax Lung cancer screening: never smoker  Rec Make sure you check your oxygen saturation  at your highest level of activity   Bicycle ergometer should be next to your concentrator  Incentive spirometry as much as possible   Please schedule a follow up visit in 3 months but call sooner if needed      01/01/2022  post hosp f/u ov/[place] office/Sherisa Gilvin re: post covid resp failure 02 dep  maint on 02   Chief Complaint  [facility] presents [facility]   [facility]. Feels breathing is improving since last OV.  2LO2 pulse and 1.5 LO2 at home.    Dyspnea:  walks up to 30 min and stops every 5 min when stops with sats in mid 80's and not titrating as rec  Also rides bike x 15 min  Cough: none / still hoarse but not much different than baseline  Sleeping: < 30 degrees in recliner flatter than he was  SABA use: none  02: 1.5 at rest and sleeping/ 2lpm when walking  Covid status: vax x 2 / and covid infections  rec Keep up the exercise, just make sure you keep your saturations above 90% goal.    04/08/2022  f/u ov/[place] office/Marguerita Stapp re: resp failure post covid  maint on 02 24/7   Chief Complaint  Patient presents with   Follow-up    Breathing the same since last OV  2LO2 cont at home  5Lo2 pulse   Dyspnea:  walking 30 min private drive and has to stop 3-4 times / desats on 5lpm does not turn it up to 6 max  Stationery bike not using  Cough: none / swallowing ok  Sleeping: < 30 degrees recliner since covid  SABA use: none  02: 2lpm and up 5 lpm but POC  goes up to 6  Covid status: vax x 2/ infected once  Rec  HRCT c/w post covid changes    Admit date:     05/06/2022  Discharge date: 05/11/22  Discharge Physician: CBarton Dubois   PCP: HCelene Squibb MD    Recommendations at discharge:  Repeat basic metabolic panel to follow electrolytes and renal function Reassess blood pressure and adjust antihypertensive treatment as needed Follow patient response to the use of BiPAP and adjusted oxygen supplementation therapy. Make sure patient follow-up with pulmonologist as instructed.   Discharge Diagnoses: Principal Problem:   Respiratory failure with hypoxia and hypercapnia (HCC) Active Problems:   Pneumonia of both lower lobes due to infectious organism   Acute on  chronic respiratory failure with hypoxia (HCC)   Pulmonary fibrosis (HCC)   Acute on chronic diastolic CHF (congestive heart failure) [facility])     Hospital Course: As per H&P written by Dr. Sloan Leiter on 05/06/2022 GURKARAN RAHM is a 71 y.o. male with medical history significant of chronic hypoxic respiratory failure on 2-3 L of oxygen at home, chronic HFpEF, DM-2, HTN, HLD presented to the hospital from home with shortness of breath x2 days.  Please note-history is limited as patient is on BiPAP-most of this history is obtained after talking with the patient briefly,spouse and by chart review.  Apparently-for the past 2 days or so-patient had shortness of breath-and worsening lower extremity swelling.  He also had a productive cough.  Family noted worsening leg swelling for the past 4 days-and started giving him his as needed Lasix dose more frequently.  Family bumped up his oxygen to 4 L to alleviate his shortness of breath.  Per history-his shortness of breath is mostly worse with exertion.  Due to worsening shortness of breath- EMS was called-apparently his O2 saturations was in the 70s on 4 L-he was subsequently placed on a nonrebreather mask-and brought to the ED.  In the ED-he was  placed on BiPAP.  TRH was asked to consult for further evaluation and treatment   No reported history of fever, chest pain, abdominal pain, nausea vomiting.  There is no history of diarrhea.   Assessment and Plan: Acute on chronic hypoxic and hypercarbic respiratory failure (2-3 L of oxygen at baseline):  -Appears to be multifactorial-from decompensated diastolic heart failure, possible aspiration pneumonia. -Appreciate pulmonology assistance and recommendation -Continue to use BiPAP nightly -Continue diuresis and current oral antibiotics. -Will continue current bronchodilator management and wean off oxygen supplementation towards his baseline as tolerated. -Currently using 3 L nasal cannula supplementation at rest and 4 L nasal cannula supplementation with activity.. -Following pulmonology recommendations prescription for DuoNeb as needed every 6 hours has been provided. -Patient will follow-up with Dr. Melvyn Novas as an outpatient.   Acute on chronic HFpEF:  -Volume status significantly improved -No crackles appreciated on examination; only trace edema appreciated in his lower extremities.  Patient reports breathing back to baseline. -Lasix has been transitioned to oral route with intention to use 40 mg on daily basis. -Low-sodium diet, adequate hydration and daily weights discussed with patient. - 2D echocardiogram with LVEF 55-60% and no other acute abnormalities. -Outpatient follow-up to repeat echocardiogram and if still elevated pulmonary artery pressures will require right heart cath. -REDsClipp ordered; and not exactly accurate. -Outpatient follow-up with cardiology service recommended.   Possible aspiration pneumonia:  -Treatment with the use of Unasyn and subsequent transition to oral Augmentin has been provided.  Afebrile and demonstrating overall improvement in his respiratory status appreciated prior to discharge. -Patient completed 3 days of Zithromax as well to cover for any  atypical infection. -Speech therapy has recommended regular diet with thin liquids. -Continue supportive care.     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   -Oxygen supplementation 3 L at rest and 4 L nasal cannula supplementation required with exertion. -Will check desaturation screening for adjustment on oxygen supplementation with exertion. -Per history-patient apparently was hospitalized for 17 days with COVID-19-and has required oxygen since then.  -Appreciate pulmonology evaluation and will follow recommendations. -Home BiPAP forms has been signed and requested; patient will benefit of the use of BiPAP nightly.  While inpatient so far  tolerating BiPAP at nighttime. -Continue to follow-up with pulmonologist as an outpatient to further adjust medication management and oxygen supplementation.   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  VTE was related to COVID-19 infection.  -CTA chest negative-will be on prophylactic Lovenox.  -Ultrasound performed 6/5 positive for right lower extremity DVT.   -Restarted on Eliquis; anticipating the need for long-term anticoagulation therapy. -Aspirin has been discontinued now that he will be taking Eliquis on daily basis.   DM-2:  -Resume home hypoglycemic regimen -Continue to follow CBGs and further adjust management as needed. -Blood sugar was stable and well controlled.   HTN:  -BP currently appears stable -Continue heart healthy/low-sodium diet. -Lifestyle changes discussed with patient. -Will reassessment of patient blood pressure with further adjustment to antihypertensive regimen as required.   Depression/anxiety:  -Resume home psychiatric medications regimen. -No hallucinations or psychosis. -Stable mood.   History of lung nodule:  -Continue outpatient follow-up  with Dr. Melvyn Novas.     Consultants: Pulmonology Procedures performed: See below for x-ray reports; 2D echo. Disposition: home. Diet recommendation: Heart healthy/low-sodium and modified carbohydrates.      06/06/2022  f/u ov/[place] office/Ashyah Quizon re: post covid resp failure  maint on bipap hs and 02  with new evidence of PH ? Who III vs II vs combination of the 2 Chief Complaint  Patient presents with   Follow-up    Breathing is getting better since switching to cont. O2   Dyspnea:  walking up to 45 min  Cough: none  Sleeping: 30 degrees electric bed  SABA use: neb once a day right after get up  02: 2.5 at rest / bipap hs  Rec We will refer you to pulmonary rehab at [facility] Make sure you check your oxygen saturation  AT  your highest level of activity (not after you stop)   to be sure it stays over 90%    Please schedule a follow up visit in 3 months but call sooner if needed - see Dr Halford Chessman on next visit  > bipap /02    10/07/2022  f/u ov/[place] office/Adolpho Meenach re: resp failure p covid 19  maint on 02  24/7   Chief Complaint  Patient presents with   Follow-up    States breathing is doing well.  Wants to do pulmonary rehab  Dyspnea:  walking up to 45 min per day /up to 6lpm required to keep sats > 90%  Cough: clear mucus  esp first  thing in am  Sleeping: 2.5 lpm and bipap hs but noting late onset insomnia/  45 degrees hob electric  SABA use: not using  02: as above  Covid status: vax x 2 / infected  Chronic rhinitis preceded the covid / 02 use > watery daytime pattern      No obvious day to day or daytime variability or assoc excess/ purulent sputum or mucus plugs or hemoptysis or cp or chest tightness, subjective wheeze or overt  hb symptoms.   Sleeping as above  without nocturnal  or early am exacerbation  of respiratory  c/o's or need for noct saba. Also denies any obvious fluctuation of symptoms with weather or environmental changes or other aggravating or alleviating factors  except as outlined above   No unusual exposure hx or h/o childhood pna/ asthma or knowledge of premature birth.  Current Allergies, Complete Past Medical History, Past Surgical History, Family History, and Social History were reviewed in [place]  [facility] electronic medical record.  ROS  The following are not active complaints unless bolded Hoarseness, sore throat, dysphagia, dental problems, itching, sneezing,  nasal congestion or discharge of excess watery mucus or purulent secretions, ear ache,   fever, chills, sweats, unintended wt loss or wt gain, classically pleuritic or exertional cp,  orthopnea pnd or arm/hand swelling  or leg swelling, presyncope, palpitations, abdominal pain, anorexia, nausea, vomiting, diarrhea  or change in bowel habits or change in bladder habits, change in stools or change in urine, dysuria, hematuria,  rash, arthralgias, visual complaints, headache, numbness, weakness or ataxia or problems with walking or coordination,  change in mood or  memory.        Current Meds  Medication Sig   ACCU-CHEK GUIDE test strip USE TO TEST TWICE DAILY.D   acetaminophen (TYLENOL) 500 MG tablet Take 1,000 mg by mouth 2 (two) times daily as needed for moderate pain.   alclomethasone (ACLOVATE) 0.05 % cream Apply topically 2 (two) times daily as needed (Rash).   ALPRAZolam (XANAX) 0.25 MG tablet Take 0.25 mg by mouth daily as needed for anxiety.   apixaban (ELIQUIS) 5 MG TABS tablet Take 1 tablet (5 mg total) by mouth 2 (two) times daily.   ascorbic acid (VITAMIN C) 500 MG tablet Take 1 tablet (500 mg total) by mouth daily.   blood glucose meter kit and supplies KIT Dispense based on patient and insurance preference. Use up to four times daily as directed. (FOR ICD-9 250.00, 250.01).   buPROPion (WELLBUTRIN XL) 150 MG 24 hr tablet Take 150 mg by mouth daily.   Cholecalciferol (VITAMIN D3) 125 MCG (5000 UT) CAPS Take 5,000 Units by mouth daily.   co-enzyme Q-10 50 MG capsule Take 50 mg by  mouth 2 (two) times daily.   famotidine (PEPCID) 20 MG tablet TAKE ONE TABLET BY MOUTH DAILY AFTER SUPPER (Patient taking differently: Take 20 mg by mouth at bedtime.)   FIASP FLEXTOUCH 100 UNIT/ML FlexTouch Pen Inject 2-8 Units into the skin every evening.   furosemide (LASIX) 40 MG tablet Take 1 tablet (40 mg total) by mouth daily.   Insulin Pen Needle (PEN NEEDLES) 32G X 4 MM MISC 1 Package by Does not apply route 4 (four) times daily -  before meals and at bedtime.   ipratropium-albuterol (DUONEB) 0.5-2.5 (3) MG/3ML SOLN Take 3 mLs by nebulization every 6 (six) hours as needed (SOB/dizziness.). (Patient taking differently: Take 3 mLs by nebulization 2 (two) times daily.)   Lancets (ONETOUCH DELICA PLUS [id number]) MISC Apply topically 2 (two) times daily.   Multiple Vitamin (MULTIVITAMIN) capsule Take 1 capsule by mouth daily.   pantoprazole (PROTONIX) 40 MG tablet Take 40 mg by mouth daily.   Phenylephrine HCl (SINEX REGULAR NA) Place 1 spray into the nose daily as needed (congestion).   potassium chloride SA (KLOR-CON M) 20 MEQ tablet Take 1 tablet (20 mEq total) by mouth daily.   rosuvastatin (CRESTOR) 5 MG tablet Take 5 mg by mouth daily.   sertraline (ZOLOFT) 50 MG tablet Take 100 mg by mouth daily.   SYNJARDY XR 12.04-999 MG TB24 Take 1 tablet by mouth 2 (two) times daily.   tamsulosin (FLOMAX) 0.4 MG CAPS capsule Take 0.4 mg by mouth at bedtime.   TOUJEO MAX SOLOSTAR 300 UNIT/ML Solostar Pen Inject 20 Units into the skin in the morning.   zinc sulfate 220 (50 Zn) MG capsule Take 1 capsule (220 mg total) by mouth daily.  Past Medical History:  Diagnosis Date   Acute respiratory disease    Atypical mole 12/30/2012   severe left post shoulder tx exc   Candidiasis of urogenital sites    Diabetes mellitus    Diffuse myofascial pain syndrome    Esophageal reflux    Hypertension    Impacted cerumen of right ear    Melanoma ([place]) 06/14/2011   left ear mohs   Mixed  hyperlipidemia    MM (malignant melanoma of skin) ([place]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   Primary insomnia    Seborrheic dermatitis, unspecified    Squamous cell carcinoma of skin 06/14/2011   left forearm medial cx3 10f   Thrombocytopenia, unspecified (HCC)         Objective:    Wt  10/07/2022       180  06/06/2022         179  04/08/2022         193  01/01/2022       182    08/01/2021      189   03/23/21 201 lb (91.2 kg)  02/09/21 200 lb (90.7 kg)  12/27/20 197 lb (89.4 kg)    Vital signs reviewed  10/07/2022  - Note at rest 02 sats  93% on 2lpm cont    General appearance:    a bit more robust today/ amb wm nad      HEENT : Oropharynx  clear     Nasal turbinates dry appearing s secretions   NECK :  without  apparent JVD/ palpable Nodes/TM    LUNGS: no acc muscle use,  Nl contour chest with minimal crackels in base bilaterally   CV:  RRR  no s3 or murmur or increase in P2, and no edema   ABD:  soft and nontender with nl inspiratory excursion in the supine position. No bruits or organomegaly appreciated   MS:  Nl gait/ ext warm without deformities Or obvious joint restrictions  calf tenderness, cyanosis or clubbing    SKIN: warm and dry without lesions    NEURO:  alert, approp, nl sensorium with  no motor or cerebellar deficits apparent.            Assessment

## 2022-10-07 NOTE — Assessment & Plan Note (Addendum)
Onset of illness 07/22/20 c/b bilateral dvt and L PTX / 02 dep at d/c 09/28/20  -  02/09/2021   Walked RA  approx   100 ft  @ slow pace  stopped due to  desats to 87% corrected to 90% on 1lpm  POC x another 100 ft then desat again   - PFT's  03/07/21   FEV1 1.26 (37 % ) ratio 0.92  p 4 % improvement from saba p 0 prior to study with DLCO  11.40 (46%) corrects to 3.09 (128%)  for alv volume and FV curve nl   - CTa 04/04/21 1. No evidence of pulmonary embolism. 2. Findings suggestive of mild to moderate severity interstitial edema. A superimposed infectious component cannot be excluded. 3. Stable posterior left upper lobe noncalcified lung nodule versus scarring.   - labs 04/09/22   ESR 25 with nl d dimer/ bnp  - HRCT  04/22/22   No change  - findings favor chronic sequelae of prior COVID pneumonia and no additional images needed    Dr Halford Chessman f/u planned:  agree re no need for duoneb here as problem is all parenchymal, not airway related  Extensive summary visit with pt going over the findings and the likelihood his doe/ 02 dep will continue indefinitely and best option = pacing/ pulmonary rehab > ordered

## 2022-10-07 NOTE — Patient Instructions (Addendum)
Be sure to adjust 02 flow to keep the sats above 92% at all times   Try atrovent nasal spry instead of flonase up to 4 x daily as needed   My office will be contacting you by phone for referral to pulmonary rehab   - if you don't hear back from my office within one week please call us back or notify us thru MyChart and we'll address it right away.   Keep the appt with Dr Halford Chessman in April 2024 and he can determine whether you need to return to this clinic also.

## 2022-10-07 NOTE — Assessment & Plan Note (Signed)
Failed flonase 10/07/2022 > try atovent 0.06% prn and ent f/u if not better

## 2022-10-07 NOTE — Assessment & Plan Note (Addendum)
Onset with covid pna Aug 2021  - desats on RA p 100 ft 02/09/2021 > titrate to sats > 90% using POC max flow of 6lpm  - 10/02/2021 patient walked at a slow pace on 2lpm. During the second lap he reported SOB and desat to 87%. Increased oxygen on second lap to 3lpm POC  and recovered to 92%. - 06/06/2022   Walked on 5lpm cont  x  3  lap(s) =  approx 450  ft  @ mod pace, stopped due to end of study with lowest 02 sats 93  - 06/06/2022 much improved on bipap hs per Dr Halford Chessman > f/u Dr Halford Chessman planned   Advise: >>>Keep appt with Dr Halford Chessman   >>> Make sure you check your oxygen saturation  AT  your highest level of activity (not after you stop)   to be sure it stays over 90% and adjust  02 flow upward to maintain this level if needed but remember to turn it back to previous settings when you stop (to conserve your supply).          Each maintenance medication was reviewed in detail including emphasizing most importantly the difference between maintenance and prns and under what circumstances the prns are to be triggered using an action plan format where appropriate.  Total time for H and P, chart review, counseling, reviewing 02  device(s) and generating customized AVS unique to this office visit / same day charting > 30 min summary final f/u ov

## 2022-10-18 ENCOUNTER — Encounter (HOSPITAL_COMMUNITY): Payer: Self-pay

## 2022-10-18 ENCOUNTER — Encounter (HOSPITAL_COMMUNITY)
Admission: RE | Admit: 2022-10-18 | Discharge: 2022-10-18 | Disposition: A | Discharge status: Home / Self Care | Payer: HMO | Source: Ambulatory Visit | Attending: Internal Medicine | Admitting: Internal Medicine

## 2022-10-18 VITALS — BP 90/50 | HR 93 | Ht 70.0 in | Wt 177.7 lb

## 2022-10-18 DIAGNOSIS — J9612 Chronic respiratory failure with hypercapnia: Secondary | ICD-10-CM | POA: Insufficient documentation

## 2022-10-18 DIAGNOSIS — J9611 Chronic respiratory failure with hypoxia: Secondary | ICD-10-CM | POA: Insufficient documentation

## 2022-10-18 LAB — GLUCOSE, CAPILLARY: Glucose-Capillary: 275 mg/dL — ABNORMAL HIGH (ref 70–99)

## 2022-10-18 NOTE — Progress Notes (Signed)
Pulmonary Individual Treatment Plan  Patient Details  Name: Tim Day MRN: [id number] Date of Birth: [birth year] Referring Provider:   [facility] from 10/18/2022 in [place]  Referring Provider Dr. Melvyn Novas       Initial Encounter Date:  Flowsheet Row PULMONARY REHAB OTHER RESP ORIENTATION from 10/18/2022 in [facility]  Date 10/18/22       Visit Diagnosis: Chronic respiratory failure with hypoxia and hypercapnia (HCC)  Patient's Home Medications on Admission:   Current Outpatient Medications:    acetaminophen (TYLENOL) 500 MG tablet, Take 1,000 mg by mouth 2 (two) times daily as needed for moderate pain., Disp: , Rfl:    alclomethasone (ACLOVATE) 0.05 % cream, Apply topically 2 (two) times daily as needed (Rash)., Disp: 180 g, Rfl: 3   ALPRAZolam (XANAX) 0.25 MG tablet, Take 0.25 mg by mouth daily as needed for anxiety., Disp: , Rfl:    apixaban (ELIQUIS) 5 MG TABS tablet, Take 1 tablet (5 mg total) by mouth 2 (two) times daily., Disp: 60 tablet, Rfl: 3   ascorbic acid (VITAMIN C) 500 MG tablet, Take 1 tablet (500 mg total) by mouth daily., Disp: 30 tablet, Rfl: 0   buPROPion (WELLBUTRIN XL) 150 MG 24 hr tablet, Take 150 mg by mouth in the morning., Disp: , Rfl:    Cholecalciferol (VITAMIN D3) 125 MCG (5000 UT) CAPS, Take 5,000 Units by mouth in the morning., Disp: , Rfl:    Chromium-Cinnamon (CINNAMON PLUS CHROMIUM PO), Take 1 tablet by mouth in the morning., Disp: , Rfl:    CO-ENZYME Q-10 PO, Take 100 mg by mouth in the morning., Disp: , Rfl:    famotidine (PEPCID) 20 MG tablet, TAKE ONE TABLET BY MOUTH DAILY AFTER SUPPER (Patient taking differently: Take 20 mg by mouth in the morning.), Disp: 30 tablet, Rfl: 11   FIASP FLEXTOUCH 100 UNIT/ML FlexTouch Pen, Inject 2-8 Units into the skin every evening., Disp: , Rfl:    furosemide (LASIX) 40 MG tablet, Take 1 tablet (40 mg total) by mouth  daily., Disp: 90 tablet, Rfl: 3   ipratropium (ATROVENT) 0.06 % nasal spray, Place 2 sprays into both nostrils 4 (four) times daily. (Patient taking differently: Place 2 sprays into both nostrils 4 (four) times daily as needed (allergies.).), Disp: 15 mL, Rfl: 12   Multiple Vitamin (MULTIVITAMIN WITH MINERALS) TABS tablet, Take 1 tablet by mouth every evening., Disp: , Rfl:    pantoprazole (PROTONIX) 40 MG tablet, Take 40 mg by mouth at bedtime., Disp: , Rfl:    potassium chloride SA (KLOR-CON M) 20 MEQ tablet, Take 1 tablet (20 mEq total) by mouth daily., Disp: 90 tablet, Rfl: 3   rosuvastatin (CRESTOR) 5 MG tablet, Take 5 mg by mouth at bedtime., Disp: , Rfl:    sertraline (ZOLOFT) 100 MG tablet, Take 100 mg by mouth in the morning., Disp: , Rfl:    SYNJARDY XR 12.04-999 MG TB24, Take 1 tablet by mouth 2 (two) times daily., Disp: , Rfl:    tamsulosin (FLOMAX) 0.4 MG CAPS capsule, Take 0.4 mg by mouth at bedtime., Disp: , Rfl:    TOUJEO MAX SOLOSTAR 300 UNIT/ML Solostar Pen, Inject 20 Units into the skin in the morning., Disp: , Rfl:    zinc sulfate 220 (50 Zn) MG capsule, Take 1 capsule (220 mg total) by mouth daily. (Patient taking differently: Take 220 mg by mouth every evening.), Disp: 30 capsule, Rfl: 0   ACCU-CHEK GUIDE  test strip, USE TO TEST TWICE DAILY.D, Disp: , Rfl:    blood glucose meter kit and supplies KIT, Dispense based on patient and insurance preference. Use up to four times daily as directed. (FOR ICD-9 250.00, 250.01)., Disp: 1 each, Rfl: 0   Insulin Pen Needle (PEN NEEDLES) 32G X 4 MM MISC, 1 Package by Does not apply route 4 (four) times daily -  before meals and at bedtime., Disp: 1 each, Rfl: 0   Lancets (ONETOUCH DELICA PLUS [id number]) MISC, Apply topically 2 (two) times daily., Disp: , Rfl:   Past Medical History: Past Medical History:  Diagnosis Date   Acute respiratory disease    Atypical mole 12/30/2012   severe left post shoulder tx exc   Candidiasis of urogenital  sites    Diabetes mellitus    Diffuse myofascial pain syndrome    Esophageal reflux    Hypertension    Impacted cerumen of right ear    Melanoma ([facility]) 06/14/2011   left ear mohs   Mixed hyperlipidemia    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   Primary insomnia    Seborrheic dermatitis, unspecified    Squamous cell carcinoma of skin 06/14/2011   left forearm medial cx3 22f   Thrombocytopenia, unspecified (HCC)     Tobacco Use: Social History   Tobacco Use  Smoking Status Never  Smokeless Tobacco Never    Labs: Review Flowsheet  More data exists      Latest Ref Rng & Units 04/09/2021 04/10/2021 11/17/2021 05/06/2022 05/07/2022  Labs for ITP Cardiac and Pulmonary Rehab  Cholestrol 0 - 200 mg/dL - 91  - - -  LDL (calc) 0 - 99 mg/dL - 47  - - -  HDL-C >40 mg/dL - 13  - - -  Trlycerides <150 mg/dL - 156  146  - - -  Hemoglobin A1c 4.8 - 5.6 % 7.1  - 6.3  - -  PH, Arterial 7.35 - 7.45 7.350  7.359  C - 7.478  7.325  7.25  7.26  7.33   PCO2 arterial 32 - 48 mmHg 50.2  42.9  C - 32.5  59.5  87  77  84   Bicarbonate 20.0 - 28.0 mmol/L 27.7  23.3  C - 24.2  31.0  38.2  34.6  39.8  34.7  44.3   TCO2 22 - 32 mmol/L 29  - 25  33  - -  Acid-base deficit 0.0 - 2.0 mmol/L 1.1  C - - - -  O2 Saturation % 100.0  99.5  C - 99.0  100.0  97.4  94.7  97.3  96.6  93.9     Details      C Corrected result   Multiple values from one day are sorted in reverse-chronological order         Capillary Blood Glucose: Lab Results  Component Value Date   GLUCAP 275 (H) 10/18/2022   GLUCAP 190 (H) 05/11/2022   GLUCAP 131 (H) 05/11/2022   GLUCAP 157 (H) 05/11/2022   GLUCAP 153 (H) 05/11/2022     Pulmonary Assessment Scores:  Pulmonary Assessment Scores     Row Name 10/18/22 1341         ADL UCSD   ADL Phase Entry     SOB Score total 58     Rest 1     Walk 2     Stairs 4     Bath 2  Dress 2     Shop 3       CAT Score   CAT Score 20        mMRC Score   mMRC Score 3             UCSD: Self-administered rating of dyspnea associated with activities of daily living (ADLs) 6-point scale (0 = "not at all" to 5 = "maximal or unable to do because of breathlessness")  Scoring Scores range from 0 to 120.  Minimally important difference is 5 units  CAT: CAT can identify the health impairment of COPD patients and is better correlated with disease progression.  CAT has a scoring range of zero to 40. The CAT score is classified into four groups of low (less than 10), medium (10 - 20), high (21-30) and very high (31-40) based on the impact level of disease on health status. A CAT score over 10 suggests significant symptoms.  A worsening CAT score could be explained by an exacerbation, poor medication adherence, poor inhaler technique, or progression of COPD or comorbid conditions.  CAT MCID is 2 points  mMRC: mMRC (Modified Medical Research Council) Dyspnea Scale is used to assess the degree of baseline functional disability in patients of respiratory disease due to dyspnea. No minimal important difference is established. A decrease in score of 1 point or greater is considered a positive change.   Pulmonary Function Assessment:   Exercise Target Goals: Exercise Program Goal: Individual exercise prescription set using results from initial 6 min walk test and THRR while considering  patient's activity barriers and safety.   Exercise Prescription Goal: Initial exercise prescription builds to 30-45 minutes a day of aerobic activity, 2-3 days per week.  Home exercise guidelines will be given to patient during program as part of exercise prescription that the participant will acknowledge.  Activity Barriers & Risk Stratification:  Activity Barriers & Cardiac Risk Stratification - 10/18/22 1244       Activity Barriers & Cardiac Risk Stratification   Activity Barriers Deconditioning;Shortness of Breath    Cardiac Risk Stratification  Moderate             6 Minute Walk:  6 Minute Walk     Row Name 10/18/22 1409         6 Minute Walk   Phase Initial     Distance 700 feet     Walk Time 6 minutes     # of Rest Breaks 2     MPH 1.32     METS 2.18     RPE 12     Perceived Dyspnea  13     Symptoms Yes (comment)     Comments Two standing breaks to due to SOB     Resting HR 93 bpm     Resting BP 90/50     Resting Oxygen Saturation  96 %     Exercise Oxygen Saturation  during 6 min walk 87 %     Max Ex. HR 122 bpm     Max Ex. BP 118/58     2 Minute Post BP 106/58       Interval HR   1 Minute HR 110     2 Minute HR 118     3 Minute HR 120     4 Minute HR 113     5 Minute HR 113     6 Minute HR 122     2 Minute Post HR 102  Interval Heart Rate? Yes       Interval Oxygen   Interval Oxygen? Yes     Baseline Oxygen Saturation % 96 %     1 Minute Oxygen Saturation % 90 %     1 Minute Liters of Oxygen 4 L     2 Minute Oxygen Saturation % 97 %     2 Minute Liters of Oxygen 4 L     3 Minute Oxygen Saturation % 97 %     3 Minute Liters of Oxygen 6 L     4 Minute Oxygen Saturation % 90 %     4 Minute Liters of Oxygen 6 L     5 Minute Oxygen Saturation % 90 %     5 Minute Liters of Oxygen 6 L     6 Minute Oxygen Saturation % 87 %     6 Minute Liters of Oxygen 6 L     2 Minute Post Oxygen Saturation % 94 %     2 Minute Post Liters of Oxygen 6 L              Oxygen Initial Assessment:  Oxygen Initial Assessment - 10/18/22 1344       Home Oxygen   Home Oxygen Device Home Concentrator;E-Tanks    Sleep Oxygen Prescription BiPAP;Continuous    Liters per minute 3    Home Exercise Oxygen Prescription Continuous    Liters per minute 4    Home Resting Oxygen Prescription Continuous    Liters per minute 2    Compliance with Home Oxygen Use Yes      Intervention   Short Term Goals To learn and exhibit compliance with exercise, home and travel O2 prescription;To learn and understand importance  of monitoring SPO2 with pulse oximeter and demonstrate accurate use of the pulse oximeter.;To learn and understand importance of maintaining oxygen saturations>88%;To learn and demonstrate proper pursed lip breathing techniques or other breathing techniques.     Long  Term Goals Exhibits compliance with exercise, home  and travel O2 prescription;Verbalizes importance of monitoring SPO2 with pulse oximeter and return demonstration;Maintenance of O2 saturations>88%;Exhibits proper breathing techniques, such as pursed lip breathing or other method taught during program session             Oxygen Re-Evaluation:  Oxygen Re-Evaluation     Row Name 10/18/22 1342 10/18/22 1343           Home Oxygen   Home Oxygen Device -- --      Sleep Oxygen Prescription -- --      Liters per minute -- --      Home Exercise Oxygen Prescription -- --      Liters per minute -- --      Home Resting Oxygen Prescription -- --      Liters per minute -- --      Compliance with Home Oxygen Use -- --        Goals/Expected Outcomes   Short Term Goals -- --               Oxygen Discharge (Final Oxygen Re-Evaluation):  Oxygen Re-Evaluation - 10/18/22 1343       Home Oxygen   Home Oxygen Device --    Sleep Oxygen Prescription --    Liters per minute --    Home Exercise Oxygen Prescription --    Liters per minute --    Home Resting Oxygen Prescription --    Liters per minute --  Compliance with Home Oxygen Use --      Goals/Expected Outcomes   Short Term Goals --             Initial Exercise Prescription:  Initial Exercise Prescription - 10/18/22 1400       Date of Initial Exercise RX and Referring Provider   Date 10/18/22    Referring Provider Dr. Melvyn Novas    Expected Discharge Date 02/25/23      Oxygen   Oxygen Continuous    Liters 6    Maintain Oxygen Saturation 88% or higher      NuStep   Level 1    SPM 60    Minutes 17      Arm Ergometer   Level 1    RPM 40    Minutes 22       Prescription Details   Frequency (times per week) 2    Duration Progress to 30 minutes of continuous aerobic without signs/symptoms of physical distress      Intensity   THRR 40-80% of Max Heartrate 60-121    Ratings of Perceived Exertion 11-13    Perceived Dyspnea 0-4      Resistance Training   Training Prescription Yes    Weight 2    Reps 10-15             Perform Capillary Blood Glucose checks as needed.  Exercise Prescription Changes:   Exercise Comments:   Exercise Goals and Review:   Exercise Goals     Row Name 10/18/22 1416             Exercise Goals   Increase Physical Activity Yes       Intervention Provide advice, education, support and counseling about physical activity/exercise needs.;Develop an individualized exercise prescription for aerobic and resistive training based on initial evaluation findings, risk stratification, comorbidities and participant's personal goals.       Expected Outcomes Short Term: Attend rehab on a regular basis to increase amount of physical activity.;Long Term: Exercising regularly at least 3-5 days a week.;Long Term: Add in home exercise to make exercise part of routine and to increase amount of physical activity.       Increase Strength and Stamina Yes       Intervention Provide advice, education, support and counseling about physical activity/exercise needs.;Develop an individualized exercise prescription for aerobic and resistive training based on initial evaluation findings, risk stratification, comorbidities and participant's personal goals.       Expected Outcomes Short Term: Increase workloads from initial exercise prescription for resistance, speed, and METs.;Short Term: Perform resistance training exercises routinely during rehab and add in resistance training at home;Long Term: Improve cardiorespiratory fitness, muscular endurance and strength as measured by increased METs and functional capacity (6MWT)       Able  to understand and use rate of perceived exertion (RPE) scale Yes       Intervention Provide education and explanation on how to use RPE scale       Expected Outcomes Short Term: Able to use RPE daily in rehab to express subjective intensity level;Long Term:  Able to use RPE to guide intensity level when exercising independently       Able to understand and use Dyspnea scale Yes       Intervention Provide education and explanation on how to use Dyspnea scale       Expected Outcomes Short Term: Able to use Dyspnea scale daily in rehab to express subjective sense of shortness of  breath during exertion;Long Term: Able to use Dyspnea scale to guide intensity level when exercising independently       Knowledge and understanding of Target Heart Rate Range (THRR) Yes       Intervention Provide education and explanation of THRR including how the numbers were predicted and where they are located for reference       Expected Outcomes Long Term: Able to use THRR to govern intensity when exercising independently;Short Term: Able to use daily as guideline for intensity in rehab;Short Term: Able to state/look up THRR       Understanding of Exercise Prescription Yes       Intervention Provide education, explanation, and written materials on patient's individual exercise prescription       Expected Outcomes Short Term: Able to explain program exercise prescription;Long Term: Able to explain home exercise prescription to exercise independently                Exercise Goals Re-Evaluation :   Discharge Exercise Prescription (Final Exercise Prescription Changes):   Nutrition:  Target Goals: Understanding of nutrition guidelines, daily intake of sodium <1542m, cholesterol <203m calories 30% from fat and 7% or less from saturated fats, daily to have 5 or more servings of fruits and vegetables.  Biometrics:  Pre Biometrics - 10/18/22 1416       Pre Biometrics   Height _0  (1.778 m)    Weight 80.6 kg     Waist Circumference 38 inches    Hip Circumference 37 inches    Waist to Hip Ratio 1.03 %    BMI (Calculated) 25.5    Triceps Skinfold 10 mm    Grip Strength 23.4 kg    Flexibility 0 in    Single Leg Stand 0 seconds              Nutrition Therapy Plan and Nutrition Goals:  Nutrition Therapy & Goals - 10/18/22 1335       Personal Nutrition Goals   Comments Patient scored 88 on his diet assessment. Handout provided and explained to he and his wife regarding healthy nutrition. We offer 2 educational sessions on heart healthy nutrition with handouts and assistance with RD referral if patient is interested.      Intervention Plan   Intervention Nutrition handout(s) given to patient.    Expected Outcomes Short Term Goal: Understand basic principles of dietary content, such as calories, fat, sodium, cholesterol and nutrients.             Nutrition Assessments:  Nutrition Assessments - 10/18/22 1335       MEDFICTS Scores   Pre Score 88            MEDIFICTS Score Key: ?70 Need to make dietary changes  40-70 Heart Healthy Diet ? 40 Therapeutic Level Cholesterol Diet   Picture Your Plate Scores: <4<23nhealthy dietary pattern with much room for improvement. 41-50 Dietary pattern unlikely to meet recommendations for good health and room for improvement. 51-60 More healthful dietary pattern, with some room for improvement.  >60 Healthy dietary pattern, although there may be some specific behaviors that could be improved.    Nutrition Goals Re-Evaluation:   Nutrition Goals Discharge (Final Nutrition Goals Re-Evaluation):   Psychosocial: Target Goals: Acknowledge presence or absence of significant depression and/or stress, maximize coping skills, provide positive support system. Participant is able to verbalize types and ability to use techniques and skills needed for reducing stress and depression.  Initial Review & Psychosocial Screening:  Initial Psych Review  & Screening - 10/18/22 1345       Initial Review   Current issues with Current Depression;Current Anxiety/Panic;Current Sleep Concerns      Family Dynamics   Good Support System? Yes      Barriers   Psychosocial barriers to participate in program There are no identifiable barriers or psychosocial needs.      Screening Interventions   Interventions Encouraged to exercise;Provide feedback about the scores to participant    Expected Outcomes Short Term goal: Identification and review with participant of any Quality of Life or Depression concerns found by scoring the questionnaire.             Quality of Life Scores:  Quality of Life - 10/18/22 1417       Quality of Life   Select Quality of Life      Quality of Life Scores   Health/Function Pre 16.8 %    Socioeconomic Pre 29.14 %    Psych/Spiritual Pre 18 %    Family Pre 30 %    GLOBAL Pre 21.53 %            Scores of 19 and below usually indicate a poorer quality of life in these areas.  A difference of  2-3 points is a clinically meaningful difference.  A difference of 2-3 points in the total score of the Quality of Life Index has been associated with significant improvement in overall quality of life, self-image, physical symptoms, and general health in studies assessing change in quality of life.   PHQ-9: Review Flowsheet       10/18/2022  Depression screen PHQ 2/9  Decreased Interest 1  Down, Depressed, Hopeless 1  PHQ - 2 Score 2  Altered sleeping 2  Tired, decreased energy 1  Change in appetite 0  Feeling bad or failure about yourself  1  Trouble concentrating 0  Moving slowly or fidgety/restless 0  Suicidal thoughts 0  PHQ-9 Score 6  Difficult doing work/chores Somewhat difficult   Interpretation of Total Score  Total Score Depression Severity:  1-4 = Minimal depression, 5-9 = Mild depression, 10-14 = Moderate depression, 15-19 = Moderately severe depression, 20-27 = Severe depression    Psychosocial Evaluation and Intervention:  Psychosocial Evaluation - 10/18/22 1348       Psychosocial Evaluation & Interventions   Interventions Stress management education;Relaxation education;Encouraged to exercise with the program and follow exercise prescription    Comments Patient has no psychosocial barriers identified at his orientation visit. His initial PHQ-9 score was 6. Patient contracted COVID 19 in August of 2021. 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 He also exercised routinely. He was hospitalized for at least a month and was discharged to a SNF proir to going home with continuous O2. He has had depression since COVID and is currenlty taking Wellbutrin XL 150 mg and Sertraline 100 mg. His wife says his depression has improved a lot and they feel it is managed well with the medication. He has Alprazelam 0.25 mg for anxiety which he does not take a lot of now. He did have a lot of anxiety and fear of getting SOB. He wears a pulse oximetry constantly. His wife says she believes he does this so he will not get so SOB that he panics. He reports difficulty sleeping for the past month. He has not tried any sleep aide. He is currently exercising by walking  short distances at least one time daily. He is ready to start the program hoping to be able to do more activities around the house and exercise more.    Expected Outcomes Patient will continue to have no psychosocial barriers identified and his depression will continue to be managed.    Continue Psychosocial Services  No Follow up required             Psychosocial Re-Evaluation:   Psychosocial Discharge (Final Psychosocial Re-Evaluation):    Education: Education Goals: Education classes will be provided on a weekly basis, covering required topics. Participant will state understanding/return demonstration of topics presented.  Learning Barriers/Preferences:   Learning Barriers/Preferences - 10/18/22 1336       Learning Barriers/Preferences   Learning Barriers None    Learning Preferences Audio             Education Topics: How Lungs Work and Diseases: - Discuss the anatomy of the lungs and diseases that can affect the lungs, such as COPD.   Exercise: -Discuss the importance of exercise, FITT principles of exercise, normal and abnormal responses to exercise, and how to exercise safely.   Environmental Irritants: -Discuss types of environmental irritants and how to limit exposure to environmental irritants.   Meds/Inhalers and oxygen: - Discuss respiratory medications, definition of an inhaler and oxygen, and the proper way to use an inhaler and oxygen.   Energy Saving Techniques: - Discuss methods to conserve energy and decrease shortness of breath when performing activities of daily living.    Bronchial Hygiene / Breathing Techniques: - Discuss breathing mechanics, pursed-lip breathing technique,  proper posture, effective ways to clear airways, and other functional breathing techniques   Cleaning Equipment: - Provides group verbal and written instruction about the health risks of elevated stress, cause of high stress, and healthy ways to reduce stress.   Nutrition I: Fats: - Discuss the types of cholesterol, what cholesterol does to the body, and how cholesterol levels can be controlled.   Nutrition II: Labels: -Discuss the different components of food labels and how to read food labels.   Respiratory Infections: - Discuss the signs and symptoms of respiratory infections, ways to prevent respiratory infections, and the importance of seeking medical treatment when having a respiratory infection.   Stress I: Signs and Symptoms: - Discuss the causes of stress, how stress may lead to anxiety and depression, and ways to limit stress.   Stress II: Relaxation: -Discuss relaxation techniques to limit stress.   Oxygen  for Home/Travel: - Discuss how to prepare for travel when on oxygen and proper ways to transport and store oxygen to ensure safety.   Knowledge Questionnaire Score:  Knowledge Questionnaire Score - 10/18/22 1336       Knowledge Questionnaire Score   Pre Score 14/18             Core Components/Risk Factors/Patient Goals at Admission:  Personal Goals and Risk Factors at Admission - 10/18/22 1336       Core Components/Risk Factors/Patient Goals on Admission    Weight Management Weight Maintenance    Improve shortness of breath with ADL's Yes    Intervention Provide education, individualized exercise plan and daily activity instruction to help decrease symptoms of SOB with activities of daily living.    Expected Outcomes Short Term: Improve cardiorespiratory fitness to achieve a reduction of symptoms when performing ADLs;Long Term: Be able to perform more ADLs without symptoms or delay the onset of symptoms    Diabetes Yes  Intervention Provide education about signs/symptoms and action to take for hypo/hyperglycemia.;Provide education about proper nutrition, including hydration, and aerobic/resistive exercise prescription along with prescribed medications to achieve blood glucose in normal ranges: Fasting glucose 65-99 mg/dL    Expected Outcomes Short Term: Participant verbalizes understanding of the signs/symptoms and immediate care of hyper/hypoglycemia, proper foot care and importance of medication, aerobic/resistive exercise and nutrition plan for blood glucose control.    Personal Goal Other Yes    Personal Goal Patient wants to be able to do norman tasks around the house and be able to exercise. He wants to improve his SOB.    Intervention Patient will attend PR 2 days/week with exercise and education.    Expected Outcomes Patient will complete the program meeting both personal and program goals.             Core Components/Risk Factors/Patient Goals Review:    Core  Components/Risk Factors/Patient Goals at Discharge (Final Review):    ITP Comments:   Comments: Patient arrived for 1st visit/orientation/education at 1230. 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 During orientation advised patient on arrival and appointment times what to wear, what to do before, during and after exercise. Reviewed attendance and class policy.  Pt is scheduled to return Pulmonary Rehab on 10/29/22 at 1045. Pt was advised to come to class 15 minutes before class starts.  Discussed RPE/Dpysnea scales. Patient participated in warm up stretches. Patient was able to complete 6 minute walk test.  Patient was measured for the equipment. Discussed equipment safety with patient. Took patient pre-anthropometric measurements. Patient finished visit at 1400.

## 2022-10-29 ENCOUNTER — Encounter (HOSPITAL_COMMUNITY)
Admission: RE | Admit: 2022-10-29 | Discharge: 2022-10-29 | Disposition: A | Discharge status: Home / Self Care | Payer: HMO | Source: Ambulatory Visit | Attending: Internal Medicine | Admitting: Internal Medicine

## 2022-10-29 DIAGNOSIS — J9611 Chronic respiratory failure with hypoxia: Secondary | ICD-10-CM

## 2022-10-29 DIAGNOSIS — J9612 Chronic respiratory failure with hypercapnia: Secondary | ICD-10-CM | POA: Diagnosis present

## 2022-10-29 LAB — GLUCOSE, CAPILLARY: Glucose-Capillary: 224 mg/dL — ABNORMAL HIGH (ref 70–99)

## 2022-10-29 NOTE — Progress Notes (Signed)
Daily Session Note  Patient Details  Name: Tim Day MRN: [id number] Date of Birth: 1951/03/24 Referring Provider:   [place] from 10/18/2022 in [place]  Referring Provider Dr. Melvyn Novas       Encounter Date: 10/29/2022  Check In:  Session Check In - 10/29/22 1045       Check-In   Supervising physician immediately available to respond to emergencies [facility] MD immediately available    Physician(s) Dr Dellia Cloud    Location [facility]    Staff Present Leana Roe, BS, Exercise Physiologist;Wright Gravely Hassell Done, RN, BSN;Dalton Sherrie George, MS, ACSM-CEP    Virtual Visit No    Medication changes reported     No    Fall or balance concerns reported    Yes    Comments Patient says he loses his balance frequently.    Tobacco Cessation No Change    Warm-up and Cool-down Performed as group-led instruction    Resistance Training Performed Yes    VAD Patient? No    PAD/SET Patient? No      Pain Assessment   Currently in Pain? No/denies    Pain Score 0-No pain    Multiple Pain Sites No             Capillary Blood Glucose: Results for orders placed or performed during the hospital encounter of 10/29/22 (from the past 24 hour(s))  Glucose, capillary     Status: Abnormal   Collection Time: 10/29/22 10:43 AM  Result Value Ref Range   Glucose-Capillary 224 (H) 70 - 99 mg/dL      Social History   Tobacco Use  Smoking Status Never  Smokeless Tobacco Never    Goals Met:  Proper associated with RPD/PD & O2 Sat Independence with exercise equipment Using PLB without cueing & demonstrates good technique Exercise tolerated well Queuing for purse lip breathing No report of concerns or symptoms today Strength training completed today  Goals Unmet:  Not Applicable  Comments: Checkout at 1145.   Dr. Kathie Dike is Medical Director for [facility] Pulmonary Rehab.

## 2022-10-31 ENCOUNTER — Encounter (HOSPITAL_COMMUNITY)
Admission: RE | Admit: 2022-10-31 | Discharge: 2022-10-31 | Disposition: A | Discharge status: Home / Self Care | Payer: HMO | Source: Ambulatory Visit | Attending: Internal Medicine | Admitting: Internal Medicine

## 2022-10-31 DIAGNOSIS — J9611 Chronic respiratory failure with hypoxia: Secondary | ICD-10-CM | POA: Diagnosis not present

## 2022-10-31 NOTE — Progress Notes (Signed)
Daily Session Note  Patient Details  Name: Tim Day MRN: [id number] Date of Birth: 1951/04/21 Referring Provider:   Flowsheet Row PULMONARY REHAB OTHER RESP ORIENTATION from 10/18/2022 in [place]  Referring Provider Dr. Melvyn Novas       Encounter Date: 10/31/2022  Check In:  Session Check In - 10/31/22 1045       Check-In   Supervising physician immediately available to respond to emergencies [facility] MD immediately available    Physician(s) Dr Dellia Cloud    Location [facility]    Staff Present Leana Roe, BS, Exercise Physiologist;Safaa Stingley Hassell Done, RN, BSN    Virtual Visit No    Medication changes reported     No    Fall or balance concerns reported    Yes    Comments Patient says he loses his balance frequently.    Tobacco Cessation No Change    Warm-up and Cool-down Performed as group-led instruction    Resistance Training Performed Yes    VAD Patient? No    PAD/SET Patient? No      Pain Assessment   Currently in Pain? No/denies    Pain Score 0-No pain    Multiple Pain Sites No             Capillary Blood Glucose: No results found for this or any previous visit (from the past 24 hour(s)).    Social History   Tobacco Use  Smoking Status Never  Smokeless Tobacco Never    Goals Met:  Proper associated with RPD/PD & O2 Sat Independence with exercise equipment Using PLB without cueing & demonstrates good technique Exercise tolerated well Queuing for purse lip breathing No report of concerns or symptoms today Strength training completed today  Goals Unmet:  Not Applicable  Comments: Checkout at 1145.   Dr. Kathie Dike is Medical Director for [facility] Pulmonary Rehab.

## 2022-11-05 ENCOUNTER — Encounter (HOSPITAL_COMMUNITY)
Admission: RE | Admit: 2022-11-05 | Discharge: 2022-11-05 | Disposition: A | Discharge status: Home / Self Care | Payer: HMO | Source: Ambulatory Visit | Attending: Internal Medicine | Admitting: Internal Medicine

## 2022-11-05 VITALS — Wt 177.7 lb

## 2022-11-05 DIAGNOSIS — J9611 Chronic respiratory failure with hypoxia: Secondary | ICD-10-CM | POA: Diagnosis present

## 2022-11-05 DIAGNOSIS — J9612 Chronic respiratory failure with hypercapnia: Secondary | ICD-10-CM | POA: Diagnosis present

## 2022-11-05 NOTE — Progress Notes (Signed)
Daily Session Note  Patient Details  Name: Tim Day MRN: [id number] Date of Birth: 03-22-1951 Referring Provider:   Flowsheet Row PULMONARY REHAB OTHER RESP ORIENTATION from 10/18/2022 in [place]  Referring Provider Dr. Melvyn Novas       Encounter Date: 11/05/2022  Check In:  Session Check In - 11/05/22 1045       Check-In   Supervising physician immediately available to respond to emergencies [facility] MD immediately available    Physician(s) Dr Harl Bowie    Location [facility]    Staff Present Leana Roe, BS, Exercise Physiologist;Shirleymae Hauth Hassell Done, RN, BSN    Virtual Visit No    Medication changes reported     No    Fall or balance concerns reported    Yes    Comments Patient says he loses his balance frequently.    Tobacco Cessation No Change    Warm-up and Cool-down Performed as group-led instruction    Resistance Training Performed Yes    VAD Patient? No    PAD/SET Patient? No      Pain Assessment   Currently in Pain? No/denies    Pain Score 0-No pain    Multiple Pain Sites No             Capillary Blood Glucose: No results found for this or any previous visit (from the past 24 hour(s)).    Social History   Tobacco Use  Smoking Status Never  Smokeless Tobacco Never    Goals Met:  Proper associated with RPD/PD & O2 Sat Independence with exercise equipment Using PLB without cueing & demonstrates good technique Exercise tolerated well Queuing for purse lip breathing No report of concerns or symptoms today Strength training completed today  Goals Unmet:  Not Applicable  Comments: Checkout at 1145.   Dr. Kathie Dike is Medical Director for [facility] Pulmonary Rehab.

## 2022-11-06 NOTE — Progress Notes (Signed)
Pulmonary Individual Treatment Plan  Patient Details  Name: Tim Day MRN: [id number] Date of Birth: 15-Apr-1951 Referring Provider:   [facility] from 10/18/2022 in [place]  Referring Provider Dr. Melvyn Novas       Initial Encounter Date:  Flowsheet Row PULMONARY REHAB OTHER RESP ORIENTATION from 10/18/2022 in [place]  Date 10/18/22       Visit Diagnosis: Chronic respiratory failure with hypoxia and hypercapnia (HCC)  Patient's Home Medications on Admission:   Current Outpatient Medications:    ACCU-CHEK GUIDE test strip, USE TO TEST TWICE DAILY.D, Disp: , Rfl:    acetaminophen (TYLENOL) 500 MG tablet, Take 1,000 mg by mouth 2 (two) times daily as needed for moderate pain., Disp: , Rfl:    alclomethasone (ACLOVATE) 0.05 % cream, Apply topically 2 (two) times daily as needed (Rash)., Disp: 180 g, Rfl: 3   ALPRAZolam (XANAX) 0.25 MG tablet, Take 0.25 mg by mouth daily as needed for anxiety., Disp: , Rfl:    apixaban (ELIQUIS) 5 MG TABS tablet, Take 1 tablet (5 mg total) by mouth 2 (two) times daily., Disp: 60 tablet, Rfl: 3   ascorbic acid (VITAMIN C) 500 MG tablet, Take 1 tablet (500 mg total) by mouth daily., Disp: 30 tablet, Rfl: 0   blood glucose meter kit and supplies KIT, Dispense based on patient and insurance preference. Use up to four times daily as directed. (FOR ICD-9 250.00, 250.01)., Disp: 1 each, Rfl: 0   buPROPion (WELLBUTRIN XL) 150 MG 24 hr tablet, Take 150 mg by mouth in the morning., Disp: , Rfl:    Cholecalciferol (VITAMIN D3) 125 MCG (5000 UT) CAPS, Take 5,000 Units by mouth in the morning., Disp: , Rfl:    Chromium-Cinnamon (CINNAMON PLUS CHROMIUM PO), Take 1 tablet by mouth in the morning., Disp: , Rfl:    CO-ENZYME Q-10 PO, Take 100 mg by mouth in the morning., Disp: , Rfl:    famotidine (PEPCID) 20 MG tablet, TAKE ONE TABLET BY MOUTH DAILY AFTER SUPPER (Patient  taking differently: Take 20 mg by mouth in the morning.), Disp: 30 tablet, Rfl: 11   FIASP FLEXTOUCH 100 UNIT/ML FlexTouch Pen, Inject 2-8 Units into the skin every evening., Disp: , Rfl:    furosemide (LASIX) 40 MG tablet, Take 1 tablet (40 mg total) by mouth daily., Disp: 90 tablet, Rfl: 3   Insulin Pen Needle (PEN NEEDLES) 32G X 4 MM MISC, 1 Package by Does not apply route 4 (four) times daily -  before meals and at bedtime., Disp: 1 each, Rfl: 0   ipratropium (ATROVENT) 0.06 % nasal spray, Place 2 sprays into both nostrils 4 (four) times daily. (Patient taking differently: Place 2 sprays into both nostrils 4 (four) times daily as needed (allergies.).), Disp: 15 mL, Rfl: 12   Lancets (ONETOUCH DELICA PLUS MCNOBS96G) MISC, Apply topically 2 (two) times daily., Disp: , Rfl:    Multiple Vitamin (MULTIVITAMIN WITH MINERALS) TABS tablet, Take 1 tablet by mouth every evening., Disp: , Rfl:    pantoprazole (PROTONIX) 40 MG tablet, Take 40 mg by mouth at bedtime., Disp: , Rfl:    potassium chloride SA (KLOR-CON M) 20 MEQ tablet, Take 1 tablet (20 mEq total) by mouth daily., Disp: 90 tablet, Rfl: 3   rosuvastatin (CRESTOR) 5 MG tablet, Take 5 mg by mouth at bedtime., Disp: , Rfl:    sertraline (ZOLOFT) 100 MG tablet, Take 100 mg by mouth in the morning., Disp: ,  Rfl:    SYNJARDY XR 12.04-999 MG TB24, Take 1 tablet by mouth 2 (two) times daily., Disp: , Rfl:    tamsulosin (FLOMAX) 0.4 MG CAPS capsule, Take 0.4 mg by mouth at bedtime., Disp: , Rfl:    TOUJEO MAX SOLOSTAR 300 UNIT/ML Solostar Pen, Inject 20 Units into the skin in the morning., Disp: , Rfl:    zinc sulfate 220 (50 Zn) MG capsule, Take 1 capsule (220 mg total) by mouth daily. 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   Primary insomnia    Seborrheic dermatitis, unspecified    Squamous cell carcinoma of skin 06/14/2011   left forearm medial [id number]   Thrombocytopenia, unspecified (HCC)     Tobacco Use: Social History   Tobacco Use  Smoking Status Never  Smokeless Tobacco Never    Labs: Review Flowsheet  More data exists      Latest Ref Rng & Units 04/09/2021 04/10/2021 11/17/2021 05/06/2022 05/07/2022  Labs for ITP Cardiac and Pulmonary Rehab  Cholestrol 0 - 200 mg/dL - 91  - - -  LDL (calc) 0 - 99 mg/dL - 47  - - -  HDL-C >40 mg/dL - 13  - - -  Trlycerides <150 mg/dL - 156  146  - - -  Hemoglobin A1c 4.8 - 5.6 % 7.1  - 6.3  - -  PH, Arterial 7.35 - 7.45 7.350  7.359  C - 7.478  7.325  7.25  7.26  7.33   PCO2 arterial 32 - 48 mmHg 50.2  42.9  C - 32.5  59.5  87  77  84   Bicarbonate 20.0 - 28.0 mmol/L 27.7  23.3  C - 24.2  31.0  38.2  34.6  39.8  34.7  44.3   TCO2 22 - 32 mmol/L 29  - 25  33  - -  Acid-base deficit 0.0 - 2.0 mmol/L 1.1  C - - - -  O2 Saturation % 100.0  99.5  C - 99.0  100.0  97.4  94.7  97.3  96.6  93.9     Details      C Corrected result   Multiple values from one day are sorted in reverse-chronological order         Capillary Blood Glucose: Lab Results  Component Value Date   GLUCAP 224 (H) 10/29/2022   GLUCAP 275 (H) 10/18/2022   GLUCAP 190 (H) 05/11/2022   GLUCAP 131 (H) 05/11/2022   GLUCAP 157 (H) 05/11/2022     Pulmonary Assessment Scores:  Pulmonary Assessment Scores     Row Name 10/18/22 1341         ADL UCSD   ADL Phase Entry     SOB Score total 58     Rest 1     Walk 2     Stairs 4     Bath 2  Dress 2     Shop 3       CAT Score   CAT  Score 20       mMRC Score   mMRC Score 3             UCSD: Self-administered rating of dyspnea associated with activities of daily living (ADLs) 6-point scale (0 = "not at all" to 5 = "maximal or unable to do because of breathlessness")  Scoring Scores range from 0 to 120.  Minimally important difference is 5 units  CAT: CAT can identify the health impairment of COPD patients and is better correlated with disease progression.  CAT has a scoring range of zero to 40. The CAT score is classified into four groups of low (less than 10), medium (10 - 20), high (21-30) and very high (31-40) based on the impact level of disease on health status. A CAT score over 10 suggests significant symptoms.  A worsening CAT score could be explained by an exacerbation, poor medication adherence, poor inhaler technique, or progression of COPD or comorbid conditions.  CAT MCID is 2 points  mMRC: mMRC (Modified Medical Research Council) Dyspnea Scale is used to assess the degree of baseline functional disability in patients of respiratory disease due to dyspnea. No minimal important difference is established. A decrease in score of 1 point or greater is considered a positive change.   Pulmonary Function Assessment:   Exercise Target Goals: Exercise Program Goal: Individual exercise prescription set using results from initial 6 min walk test and THRR while considering  patient's activity barriers and safety.   Exercise Prescription Goal: Initial exercise prescription builds to 30-45 minutes a day of aerobic activity, 2-3 days per week.  Home exercise guidelines will be given to patient during program as part of exercise prescription that the participant will acknowledge.  Activity Barriers & Risk Stratification:  Activity Barriers & Cardiac Risk Stratification - 10/18/22 1244       Activity Barriers & Cardiac Risk Stratification   Activity Barriers Deconditioning;Shortness of Breath    Cardiac Risk  Stratification Moderate             6 Minute Walk:  6 Minute Walk     Row Name 10/18/22 1409         6 Minute Walk   Phase Initial     Distance 700 feet     Walk Time 6 minutes     # of Rest Breaks 2     MPH 1.32     METS 2.18     RPE 12     Perceived Dyspnea  13     Symptoms Yes (comment)     Comments Two standing breaks to due to SOB     Resting HR 93 bpm     Resting BP 90/50     Resting Oxygen Saturation  96 %     Exercise Oxygen Saturation  during 6 min walk 87 %     Max Ex. HR 122 bpm     Max Ex. BP 118/58     2 Minute Post BP 106/58       Interval HR   1 Minute HR 110     2 Minute HR 118     3 Minute HR 120     4 Minute HR 113     5 Minute HR 113     6 Minute HR 122     2 Minute Post HR 102  Interval Heart Rate? Yes       Interval Oxygen   Interval Oxygen? Yes     Baseline Oxygen Saturation % 96 %     1 Minute Oxygen Saturation % 90 %     1 Minute Liters of Oxygen 4 L     2 Minute Oxygen Saturation % 97 %     2 Minute Liters of Oxygen 4 L     3 Minute Oxygen Saturation % 97 %     3 Minute Liters of Oxygen 6 L     4 Minute Oxygen Saturation % 90 %     4 Minute Liters of Oxygen 6 L     5 Minute Oxygen Saturation % 90 %     5 Minute Liters of Oxygen 6 L     6 Minute Oxygen Saturation % 87 %     6 Minute Liters of Oxygen 6 L     2 Minute Post Oxygen Saturation % 94 %     2 Minute Post Liters of Oxygen 6 L              Oxygen Initial Assessment:  Oxygen Initial Assessment - 10/18/22 1344       Home Oxygen   Home Oxygen Device Home Concentrator;E-Tanks    Sleep Oxygen Prescription BiPAP;Continuous    Liters per minute 3    Home Exercise Oxygen Prescription Continuous    Liters per minute 4    Home Resting Oxygen Prescription Continuous    Liters per minute 2    Compliance with Home Oxygen Use Yes      Intervention   Short Term Goals To learn and exhibit compliance with exercise, home and travel O2 prescription;To learn and  understand importance of monitoring SPO2 with pulse oximeter and demonstrate accurate use of the pulse oximeter.;To learn and understand importance of maintaining oxygen saturations>88%;To learn and demonstrate proper pursed lip breathing techniques or other breathing techniques.     Long  Term Goals Exhibits compliance with exercise, home  and travel O2 prescription;Verbalizes importance of monitoring SPO2 with pulse oximeter and return demonstration;Maintenance of O2 saturations>88%;Exhibits proper breathing techniques, such as pursed lip breathing or other method taught during program session             Oxygen Re-Evaluation:  Oxygen Re-Evaluation     Row Name 10/18/22 1342 10/18/22 1343 11/05/22 1317         Program Oxygen Prescription   Program Oxygen Prescription -- -- Continuous     Liters per minute -- -- 6       Home Oxygen   Home Oxygen Device -- -- Home Concentrator;E-Tanks     Sleep Oxygen Prescription -- -- BiPAP;Continuous     Liters per minute -- -- 3     Home Exercise Oxygen Prescription -- -- Continuous     Liters per minute -- -- 4     Home Resting Oxygen Prescription -- -- Continuous     Liters per minute -- -- 2     Compliance with Home Oxygen Use -- -- Yes       Goals/Expected Outcomes   Short Term Goals -- -- To learn and exhibit compliance with exercise, home and travel O2 prescription;To learn and understand importance of monitoring SPO2 with pulse oximeter and demonstrate accurate use of the pulse oximeter.;To learn and understand importance of maintaining oxygen saturations>88%;To learn and demonstrate proper pursed lip breathing techniques or other breathing techniques.  Long  Term Goals -- -- Exhibits compliance with exercise, home  and travel O2 prescription;Verbalizes importance of monitoring SPO2 with pulse oximeter and return demonstration;Maintenance of O2 saturations>88%;Exhibits proper breathing techniques, such as pursed lip breathing or other  method taught during program session     Goals/Expected Outcomes -- -- Complinace              Oxygen Discharge (Final Oxygen Re-Evaluation):  Oxygen Re-Evaluation - 11/05/22 1317       Program Oxygen Prescription   Program Oxygen Prescription Continuous    Liters per minute 6      Home Oxygen   Home Oxygen Device Home Concentrator;E-Tanks    Sleep Oxygen Prescription BiPAP;Continuous    Liters per minute 3    Home Exercise Oxygen Prescription Continuous    Liters per minute 4    Home Resting Oxygen Prescription Continuous    Liters per minute 2    Compliance with Home Oxygen Use Yes      Goals/Expected Outcomes   Short Term Goals To learn and exhibit compliance with exercise, home and travel O2 prescription;To learn and understand importance of monitoring SPO2 with pulse oximeter and demonstrate accurate use of the pulse oximeter.;To learn and understand importance of maintaining oxygen saturations>88%;To learn and demonstrate proper pursed lip breathing techniques or other breathing techniques.     Long  Term Goals Exhibits compliance with exercise, home  and travel O2 prescription;Verbalizes importance of monitoring SPO2 with pulse oximeter and return demonstration;Maintenance of O2 saturations>88%;Exhibits proper breathing techniques, such as pursed lip breathing or other method taught during program session    Goals/Expected Outcomes Complinace             Initial Exercise Prescription:  Initial Exercise Prescription - 10/18/22 1400       Date of Initial Exercise RX and Referring Provider   Date 10/18/22    Referring Provider Dr. Melvyn Novas    Expected Discharge Date 02/25/23      Oxygen   Oxygen Continuous    Liters 6    Maintain Oxygen Saturation 88% or higher      NuStep   Level 1    SPM 60    Minutes 17      Arm Ergometer   Level 1    RPM 40    Minutes 22      Prescription Details   Frequency (times per week) 2    Duration Progress to 30 minutes of  continuous aerobic without signs/symptoms of physical distress      Intensity   THRR 40-80% of Max Heartrate 60-121    Ratings of Perceived Exertion 11-13    Perceived Dyspnea 0-4      Resistance Training   Training Prescription Yes    Weight 2    Reps 10-15             Perform Capillary Blood Glucose checks as needed.  Exercise Prescription Changes:   Exercise Prescription Changes     Row Name 11/05/22 1300             Response to Exercise   Blood Pressure (Admit) 122/50       Blood Pressure (Exercise) 126/50       Blood Pressure (Exit) 110/60       Heart Rate (Admit) 86 bpm       Heart Rate (Exercise) 95 bpm       Heart Rate (Exit) 88 bpm       Oxygen Saturation (Admit)  93 %       Oxygen Saturation (Exercise) 93 %       Oxygen Saturation (Exit) 88 %       Rating of Perceived Exertion (Exercise) 13       Perceived Dyspnea (Exercise) 13       Duration Continue with 30 min of aerobic exercise without signs/symptoms of physical distress.       Intensity THRR unchanged         Progression   Progression Continue to progress workloads to maintain intensity without signs/symptoms of physical distress.         Resistance Training   Training Prescription Yes       Weight 5       Reps 10-15       Time 10 Minutes         Oxygen   Oxygen Continuous       Liters 6         NuStep   Level 2       SPM 93       Minutes 17       METs 2.1         Arm Ergometer   Level 1       RPM 44       Minutes 22       METs 1.6                Exercise Comments:   Exercise Goals and Review:   Exercise Goals     Row Name 10/18/22 1416 11/05/22 1315           Exercise Goals   Increase Physical Activity Yes Yes      Intervention Provide advice, education, support and counseling about physical activity/exercise needs.;Develop an individualized exercise prescription for aerobic and resistive training based on initial evaluation findings, risk stratification,  comorbidities and participant's personal goals. Provide advice, education, support and counseling about physical activity/exercise needs.;Develop an individualized exercise prescription for aerobic and resistive training based on initial evaluation findings, risk stratification, comorbidities and participant's personal goals.      Expected Outcomes Short Term: Attend rehab on a regular basis to increase amount of physical activity.;Long Term: Exercising regularly at least 3-5 days a week.;Long Term: Add in home exercise to make exercise part of routine and to increase amount of physical activity. Short Term: Attend rehab on a regular basis to increase amount of physical activity.;Long Term: Exercising regularly at least 3-5 days a week.;Long Term: Add in home exercise to make exercise part of routine and to increase amount of physical activity.      Increase Strength and Stamina Yes Yes      Intervention Provide advice, education, support and counseling about physical activity/exercise needs.;Develop an individualized exercise prescription for aerobic and resistive training based on initial evaluation findings, risk stratification, comorbidities and participant's personal goals. Provide advice, education, support and counseling about physical activity/exercise needs.;Develop an individualized exercise prescription for aerobic and resistive training based on initial evaluation findings, risk stratification, comorbidities and participant's personal goals.      Expected Outcomes Short Term: Increase workloads from initial exercise prescription for resistance, speed, and METs.;Short Term: Perform resistance training exercises routinely during rehab and add in resistance training at home;Long Term: Improve cardiorespiratory fitness, muscular endurance and strength as measured by increased METs and functional capacity (6MWT) Short Term: Increase workloads from initial exercise prescription for resistance, speed, and  METs.;Short Term: Perform resistance training exercises routinely during  rehab and add in resistance training at home;Long Term: Improve cardiorespiratory fitness, muscular endurance and strength as measured by increased METs and functional capacity (6MWT)      Able to understand and use rate of perceived exertion (RPE) scale Yes Yes      Intervention Provide education and explanation on how to use RPE scale Provide education and explanation on how to use RPE scale      Expected Outcomes Short Term: Able to use RPE daily in rehab to express subjective intensity level;Long Term:  Able to use RPE to guide intensity level when exercising independently Short Term: Able to use RPE daily in rehab to express subjective intensity level;Long Term:  Able to use RPE to guide intensity level when exercising independently      Able to understand and use Dyspnea scale Yes Yes      Intervention Provide education and explanation on how to use Dyspnea scale Provide education and explanation on how to use Dyspnea scale      Expected Outcomes Short Term: Able to use Dyspnea scale daily in rehab to express subjective sense of shortness of breath during exertion;Long Term: Able to use Dyspnea scale to guide intensity level when exercising independently Short Term: Able to use Dyspnea scale daily in rehab to express subjective sense of shortness of breath during exertion;Long Term: Able to use Dyspnea scale to guide intensity level when exercising independently      Knowledge and understanding of Target Heart Rate Range (THRR) Yes Yes      Intervention Provide education and explanation of THRR including how the numbers were predicted and where they are located for reference Provide education and explanation of THRR including how the numbers were predicted and where they are located for reference      Expected Outcomes Long Term: Able to use THRR to govern intensity when exercising independently;Short Term: Able to use daily as  guideline for intensity in rehab;Short Term: Able to state/look up THRR Long Term: Able to use THRR to govern intensity when exercising independently;Short Term: Able to use daily as guideline for intensity in rehab;Short Term: Able to state/look up THRR      Understanding of Exercise Prescription Yes Yes      Intervention Provide education, explanation, and written materials on patient's individual exercise prescription Provide education, explanation, and written materials on patient's individual exercise prescription      Expected Outcomes Short Term: Able to explain program exercise prescription;Long Term: Able to explain home exercise prescription to exercise independently Short Term: Able to explain program exercise prescription;Long Term: Able to explain home exercise prescription to exercise independently               Exercise Goals Re-Evaluation :  Exercise Goals Re-Evaluation     [place] Name 11/05/22 1315             Exercise Goal Re-Evaluation   Exercise Goals Review Increase Strength and Stamina;Increase Physical Activity;Able to understand and use rate of perceived exertion (RPE) scale;Able to understand and use Dyspnea scale;Knowledge and understanding of Target Heart Rate Range (THRR);Understanding of Exercise Prescription       Comments Pt has completed 3 sessions of PR. He is progressing his level on the stepper after 3 classes. He is motivated during class and enjoys coming. He is currently exercising at 2.1 METs on the stepper. Will continue to monitor and progress as able.       Expected Outcomes Through exercise at home and at rehab,  the patient will meet their stated goals.                Discharge Exercise Prescription (Final Exercise Prescription Changes):  Exercise Prescription Changes - 11/05/22 1300       Response to Exercise   Blood Pressure (Admit) 122/50    Blood Pressure (Exercise) 126/50    Blood Pressure (Exit) 110/60    Heart Rate (Admit) 86 bpm     Heart Rate (Exercise) 95 bpm    Heart Rate (Exit) 88 bpm    Oxygen Saturation (Admit) 93 %    Oxygen Saturation (Exercise) 93 %    Oxygen Saturation (Exit) 88 %    Rating of Perceived Exertion (Exercise) 13    Perceived Dyspnea (Exercise) 13    Duration Continue with 30 min of aerobic exercise without signs/symptoms of physical distress.    Intensity THRR unchanged      Progression   Progression Continue to progress workloads to maintain intensity without signs/symptoms of physical distress.      Resistance Training   Training Prescription Yes    Weight 5    Reps 10-15    Time 10 Minutes      Oxygen   Oxygen Continuous    Liters 6      NuStep   Level 2    SPM 93    Minutes 17    METs 2.1      Arm Ergometer   Level 1    RPM 44    Minutes 22    METs 1.6             Nutrition:  Target Goals: Understanding of nutrition guidelines, daily intake of sodium <1557m, cholesterol <2057m calories 30% from fat and 7% or less from saturated fats, daily to have 5 or more servings of fruits and vegetables.  Biometrics:  Pre Biometrics - 10/18/22 1416       Pre Biometrics   Height _0  (1.778 m)    Weight 80.6 kg    Waist Circumference 38 inches    Hip Circumference 37 inches    Waist to Hip Ratio 1.03 %    BMI (Calculated) 25.5    Triceps Skinfold 10 mm    Grip Strength 23.4 kg    Flexibility 0 in    Single Leg Stand 0 seconds              Nutrition Therapy Plan and Nutrition Goals:  Nutrition Therapy & Goals - 10/18/22 1335       Personal Nutrition Goals   Comments Patient scored 88 on his diet assessment. Handout provided and explained to he and his wife regarding healthy nutrition. We offer 2 educational sessions on heart healthy nutrition with handouts and assistance with RD referral if patient is interested.      Intervention Plan   Intervention Nutrition handout(s) given to patient.    Expected Outcomes Short Term Goal: Understand basic  principles of dietary content, such as calories, fat, sodium, cholesterol and nutrients.             Nutrition Assessments:  Nutrition Assessments - 10/18/22 1335       MEDFICTS Scores   Pre Score 88            MEDIFICTS Score Key: ?70 Need to make dietary changes  40-70 Heart Healthy Diet ? 40 Therapeutic Level Cholesterol Diet   Picture Your Plate Scores: <4<67nhealthy dietary pattern with much room for improvement. 41-50 Dietary pattern  unlikely to meet recommendations for good health and room for improvement. 51-60 More healthful dietary pattern, with some room for improvement.  >60 Healthy dietary pattern, although there may be some specific behaviors that could be improved.    Nutrition Goals Re-Evaluation:   Nutrition Goals Discharge (Final Nutrition Goals Re-Evaluation):   Psychosocial: Target Goals: Acknowledge presence or absence of significant depression and/or stress, maximize coping skills, provide positive support system. Participant is able to verbalize types and ability to use techniques and skills needed for reducing stress and depression.  Initial Review & Psychosocial Screening:  Initial Psych Review & Screening - 10/18/22 1345       Initial Review   Current issues with Current Depression;Current Anxiety/Panic;Current Sleep Concerns      Family Dynamics   Good Support System? Yes      Barriers   Psychosocial barriers to participate in program There are no identifiable barriers or psychosocial needs.      Screening Interventions   Interventions Encouraged to exercise;Provide feedback about the scores to participant    Expected Outcomes Short Term goal: Identification and review with participant of any Quality of Life or Depression concerns found by scoring the questionnaire.             Quality of Life Scores:  Quality of Life - 10/18/22 1417       Quality of Life   Select Quality of Life      Quality of Life Scores    Health/Function Pre 16.8 %    Socioeconomic Pre 29.14 %    Psych/Spiritual Pre 18 %    Family Pre 30 %    GLOBAL Pre 21.53 %            Scores of 19 and below usually indicate a poorer quality of life in these areas.  A difference of  2-3 points is a clinically meaningful difference.  A difference of 2-3 points in the total score of the Quality of Life Index has been associated with significant improvement in overall quality of life, self-image, physical symptoms, and general health in studies assessing change in quality of life.   PHQ-9: Review Flowsheet       10/18/2022  Depression screen PHQ 2/9  Decreased Interest 1  Down, Depressed, Hopeless 1  PHQ - 2 Score 2  Altered sleeping 2  Tired, decreased energy 1  Change in appetite 0  Feeling bad or failure about yourself  1  Trouble concentrating 0  Moving slowly or fidgety/restless 0  Suicidal thoughts 0  PHQ-9 Score 6  Difficult doing work/chores Somewhat difficult   Interpretation of Total Score  Total Score Depression Severity:  1-4 = Minimal depression, 5-9 = Mild depression, 10-14 = Moderate depression, 15-19 = Moderately severe depression, 20-27 = Severe depression   Psychosocial Evaluation and Intervention:  Psychosocial Evaluation - 10/18/22 1348       Psychosocial Evaluation & Interventions   Interventions Stress management education;Relaxation education;Encouraged to exercise with the program and follow exercise prescription    Comments Patient has no psychosocial barriers identified at his orientation visit. His initial PHQ-9 score was 6. Patient contracted COVID 19 in August of 2021. 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 He also exercised routinely. He was hospitalized for at least a month and was discharged to a SNF proir to going home with continuous O2. He has had depression since COVID and is currenlty taking Wellbutrin XL  150 mg and  Sertraline 100 mg. His wife says his depression has improved a lot and they feel it is managed well with the medication. He has Alprazelam 0.25 mg for anxiety which he does not take a lot of now. He did have a lot of anxiety and fear of getting SOB. He wears a pulse oximetry constantly. His wife says she believes he does this so he will not get so SOB that he panics. He reports difficulty sleeping for the past month. He has not tried any sleep aide. He is currently exercising by walking short distances at least one time daily. He is ready to start the program hoping to be able to do more activities around the house and exercise more.    Expected Outcomes Patient will continue to have no psychosocial barriers identified and his depression will continue to be managed.    Continue Psychosocial Services  No Follow up required             Psychosocial Re-Evaluation:  Psychosocial Re-Evaluation     [place] Name 10/29/22 1108             Psychosocial Re-Evaluation   Comments Pt starting program on 10/29/2022.  Will monitor and help patient reach his goals and improve quality of life.       Expected Outcomes Will complete program, meeting both personal and program goals.       Interventions Stress management education;Encouraged to attend Cardiac Rehabilitation for the exercise;Encouraged to attend Pulmonary Rehabilitation for the exercise;Relaxation education       Continue Psychosocial Services  No Follow up required         Initial Review   Source of Stress Concerns Chronic Illness                Psychosocial Discharge (Final Psychosocial Re-Evaluation):  Psychosocial Re-Evaluation - 10/29/22 1108       Psychosocial Re-Evaluation   Comments Pt starting program on 10/29/2022.  Will monitor and help patient reach his goals and improve quality of life.    Expected Outcomes Will complete program, meeting both personal and program goals.    Interventions Stress management  education;Encouraged to attend Cardiac Rehabilitation for the exercise;Encouraged to attend Pulmonary Rehabilitation for the exercise;Relaxation education    Continue Psychosocial Services  No Follow up required      Initial Review   Source of Stress Concerns Chronic Illness              Education: Education Goals: Education classes will be provided on a weekly basis, covering required topics. Participant will state understanding/return demonstration of topics presented.  Learning Barriers/Preferences:  Learning Barriers/Preferences - 10/18/22 1336       Learning Barriers/Preferences   Learning Barriers None    Learning Preferences Audio             Education Topics: How Lungs Work and Diseases: - Discuss the anatomy of the lungs and diseases that can affect the lungs, such as COPD.   Exercise: -Discuss the importance of exercise, FITT principles of exercise, normal and abnormal responses to exercise, and how to exercise safely.   Environmental Irritants: -Discuss types of environmental irritants and how to limit exposure to environmental irritants.   Meds/Inhalers and oxygen: - Discuss respiratory medications, definition of an inhaler and oxygen, and the proper way to use an inhaler and oxygen.   Energy Saving Techniques: - Discuss methods to conserve energy and decrease shortness of breath when performing activities of daily  living.    Bronchial Hygiene / Breathing Techniques: - Discuss breathing mechanics, pursed-lip breathing technique,  proper posture, effective ways to clear airways, and other functional breathing techniques   Cleaning Equipment: - Provides group verbal and written instruction about the health risks of elevated stress, cause of high stress, and healthy ways to reduce stress.   Nutrition I: Fats: - Discuss the types of cholesterol, what cholesterol does to the body, and how cholesterol levels can be controlled.   Nutrition II:  Labels: -Discuss the different components of food labels and how to read food labels. Flowsheet Row PULMONARY REHAB OTHER RESPIRATORY from 10/31/2022 in [place]  Date 10/31/22  Educator HB  Instruction Review Code 1- Verbalizes Understanding       Respiratory Infections: - Discuss the signs and symptoms of respiratory infections, ways to prevent respiratory infections, and the importance of seeking medical treatment when having a respiratory infection.   Stress I: Signs and Symptoms: - Discuss the causes of stress, how stress may lead to anxiety and depression, and ways to limit stress.   Stress II: Relaxation: -Discuss relaxation techniques to limit stress.   Oxygen for Home/Travel: - Discuss how to prepare for travel when on oxygen and proper ways to transport and store oxygen to ensure safety.   Knowledge Questionnaire Score:  Knowledge Questionnaire Score - 10/18/22 1336       Knowledge Questionnaire Score   Pre Score 14/18             Core Components/Risk Factors/Patient Goals at Admission:  Personal Goals and Risk Factors at Admission - 10/18/22 1336       Core Components/Risk Factors/Patient Goals on Admission    Weight Management Weight Maintenance    Improve shortness of breath with ADL's Yes    Intervention Provide education, individualized exercise plan and daily activity instruction to help decrease symptoms of SOB with activities of daily living.    Expected Outcomes Short Term: Improve cardiorespiratory fitness to achieve a reduction of symptoms when performing ADLs;Long Term: Be able to perform more ADLs without symptoms or delay the onset of symptoms    Diabetes Yes    Intervention Provide education about signs/symptoms and action to take for hypo/hyperglycemia.;Provide education about proper nutrition, including hydration, and aerobic/resistive exercise prescription along with prescribed medications to achieve blood glucose  in normal ranges: Fasting glucose 65-99 mg/dL    Expected Outcomes Short Term: Participant verbalizes understanding of the signs/symptoms and immediate care of hyper/hypoglycemia, proper foot care and importance of medication, aerobic/resistive exercise and nutrition plan for blood glucose control.    Personal Goal Other Yes    Personal Goal Patient wants to be able to do normal tasks around the house and be able to exercise. He wants to improve his SOB.    Intervention Patient will attend PR 2 days/week with exercise and education.    Expected Outcomes Patient will complete the program meeting both personal and program goals.             Core Components/Risk Factors/Patient Goals Review:   Goals and Risk Factor Review     Row Name 10/29/22 1111             Core Components/Risk Factors/Patient Goals Review   Personal Goals Review Develop more efficient breathing techniques such as purse lipped breathing and diaphragmatic breathing and practicing self-pacing with activity.;Increase knowledge of respiratory medications and ability to use respiratory devices properly.;Diabetes;Improve shortness of breath with ADL's  Review To begin program 10/29/2022.  Pt is on Oxygen at 1-2 liters at rest and 5-6 liters with exercise at home.  Has suffered with long COVID since 07/2020, requiring oxygen.       Expected Outcomes Plan of care is ongoing.  No further concerns at this time.                Core Components/Risk Factors/Patient Goals at Discharge (Final Review):   Goals and Risk Factor Review - 10/29/22 1111       Core Components/Risk Factors/Patient Goals Review   Personal Goals Review Develop more efficient breathing techniques such as purse lipped breathing and diaphragmatic breathing and practicing self-pacing with activity.;Increase knowledge of respiratory medications and ability to use respiratory devices properly.;Diabetes;Improve shortness of breath with ADL's    Review To  begin program 10/29/2022.  Pt is on Oxygen at 1-2 liters at rest and 5-6 liters with exercise at home.  Has suffered with long COVID since 07/2020, requiring oxygen.    Expected Outcomes Plan of care is ongoing.  No further concerns at this time.             ITP Comments:   Comments: ITP REVIEW Pt is making expected progress toward pulmonary rehab goals after completing 4 sessions. Recommend continued exercise, life style modification, education, and utilization of breathing techniques to increase stamina and strength and decrease shortness of breath with exertion.

## 2022-11-07 ENCOUNTER — Encounter (HOSPITAL_COMMUNITY)
Admission: RE | Admit: 2022-11-07 | Discharge: 2022-11-07 | Disposition: A | Discharge status: Home / Self Care | Payer: HMO | Source: Ambulatory Visit | Attending: Internal Medicine | Admitting: Internal Medicine

## 2022-11-07 DIAGNOSIS — J9611 Chronic respiratory failure with hypoxia: Secondary | ICD-10-CM

## 2022-11-07 NOTE — Progress Notes (Signed)
Daily Session Note  Patient Details  Name: Tim Day MRN: [id number] Date of Birth: 1951-03-07 Referring Provider:   Flowsheet Row PULMONARY REHAB OTHER RESP ORIENTATION from 10/18/2022 in [place]  Referring Provider Dr. Melvyn Novas       Encounter Date: 11/07/2022  Check In:  Session Check In - 11/07/22 1045       Check-In   Supervising physician immediately available to respond to emergencies [facility] MD immediately available    Physician(s) Dr. Harl Bowie    Location [facility]    Staff Present Leana Roe, BS, Exercise Physiologist;Jaala Bohle Sherrie George, MS, ACSM-CEP    Virtual Visit No    Medication changes reported     No    Fall or balance concerns reported    Yes    Comments Patient says he loses his balance frequently.    Tobacco Cessation No Change    Warm-up and Cool-down Performed as group-led instruction    Resistance Training Performed Yes    VAD Patient? No    PAD/SET Patient? No      Pain Assessment   Currently in Pain? No/denies    Pain Score 0-No pain    Multiple Pain Sites No             Capillary Blood Glucose: No results found for this or any previous visit (from the past 24 hour(s)).    Social History   Tobacco Use  Smoking Status Never  Smokeless Tobacco Never    Goals Met:  Proper associated with RPD/PD & O2 Sat Independence with exercise equipment Using PLB without cueing & demonstrates good technique Exercise tolerated well Queuing for purse lip breathing No report of concerns or symptoms today Strength training completed today  Goals Unmet:  Not Applicable  Comments: checkout time is 1145   Dr. Kathie Dike is Medical Director for [facility] Pulmonary Re[facility].

## 2022-11-12 ENCOUNTER — Encounter (HOSPITAL_COMMUNITY)
Admission: RE | Admit: 2022-11-12 | Discharge: 2022-11-12 | Disposition: A | Discharge status: Home / Self Care | Payer: HMO | Source: Ambulatory Visit | Attending: Internal Medicine | Admitting: Internal Medicine

## 2022-11-12 DIAGNOSIS — J9611 Chronic respiratory failure with hypoxia: Secondary | ICD-10-CM | POA: Diagnosis not present

## 2022-11-12 NOTE — Progress Notes (Signed)
Daily Session Note  Patient Details  Name: Tim Day MRN: [id number] Date of Birth: 11-16-1951 Referring Provider:   Flowsheet Row PULMONARY REHAB OTHER RESP ORIENTATION from 10/18/2022 in [place]  Referring Provider Dr. Melvyn Novas       Encounter Date: 11/12/2022  Check In:  Session Check In - 11/12/22 1042       Check-In   Supervising physician immediately available to respond to emergencies [facility] MD immediately available    Physician(s) Dr Domenic Polite    Location [facility]    Staff Present Leana Roe, BS, Exercise Physiologist;Sabree Nuon Hassell Done, RN, BSN    Virtual Visit No    Medication changes reported     No    Fall or balance concerns reported    Yes    Comments Patient says he loses his balance frequently.    Tobacco Cessation No Change    Warm-up and Cool-down Performed as group-led instruction    Resistance Training Performed Yes    VAD Patient? No    PAD/SET Patient? No      Pain Assessment   Currently in Pain? No/denies    Pain Score 0-No pain    Multiple Pain Sites No             Capillary Blood Glucose: No results found for this or any previous visit (from the past 24 hour(s)).    Social History   Tobacco Use  Smoking Status Never  Smokeless Tobacco Never    Goals Met:  Proper associated with RPD/PD & O2 Sat Independence with exercise equipment Using PLB without cueing & demonstrates good technique Exercise tolerated well Queuing for purse lip breathing No report of concerns or symptoms today Strength training completed today  Goals Unmet:  Not Applicable  Comments: Checkout at 1145.   Dr. Kathie Dike is Medical Director for [facility] Pulmonary Rehab.

## 2022-11-14 ENCOUNTER — Encounter (HOSPITAL_COMMUNITY)
Admission: RE | Admit: 2022-11-14 | Discharge: 2022-11-14 | Disposition: A | Discharge status: Home / Self Care | Payer: HMO | Source: Ambulatory Visit | Attending: Internal Medicine | Admitting: Internal Medicine

## 2022-11-14 DIAGNOSIS — J9611 Chronic respiratory failure with hypoxia: Secondary | ICD-10-CM

## 2022-11-14 NOTE — Progress Notes (Signed)
Daily Session Note  Patient Details  Name: Tim Day MRN: [id number] Date of Birth: 1951/05/22 Referring Provider:   [place] from 10/18/2022 in [place]  Referring Provider Dr. Melvyn Novas       Encounter Date: 11/14/2022  Check In:  Session Check In - 11/14/22 1044       Check-In   Supervising physician immediately available to respond to emergencies [facility] MD immediately available    Physician(s) Dr Domenic Polite    Location [facility]    Staff Present Leana Roe, BS, Exercise Physiologist;Shakari Qazi Sherrie George, MS, ACSM-CEP    Virtual Visit No    Medication changes reported     No    Fall or balance concerns reported    Yes    Comments Patient says he loses his balance frequently.    Tobacco Cessation No Change    Warm-up and Cool-down Performed as group-led instruction    Resistance Training Performed Yes    VAD Patient? No    PAD/SET Patient? No      Pain Assessment   Currently in Pain? No/denies    Pain Score 0-No pain    Multiple Pain Sites No             Capillary Blood Glucose: No results found for this or any previous visit (from the past 24 hour(s)).    Social History   Tobacco Use  Smoking Status Never  Smokeless Tobacco Never    Goals Met:  Proper associated with RPD/PD & O2 Sat Independence with exercise equipment Using PLB without cueing & demonstrates good technique Exercise tolerated well Queuing for purse lip breathing No report of concerns or symptoms today Strength training completed today  Goals Unmet:  Not Applicable  Comments: checkout time is 1145   Dr. Kathie Dike is Medical Director for [facility] Pulmonary Rehab.

## 2022-11-19 ENCOUNTER — Encounter (HOSPITAL_COMMUNITY)
Admission: RE | Admit: 2022-11-19 | Discharge: 2022-11-19 | Disposition: A | Discharge status: Home / Self Care | Payer: HMO | Source: Ambulatory Visit | Attending: Internal Medicine | Admitting: Internal Medicine

## 2022-11-19 VITALS — Wt 181.0 lb

## 2022-11-19 DIAGNOSIS — J9611 Chronic respiratory failure with hypoxia: Secondary | ICD-10-CM | POA: Diagnosis not present

## 2022-11-19 NOTE — Progress Notes (Signed)
Daily Session Note  Patient Details  Name: Tim Day MRN: [id number] Date of Birth: 1951-02-03 Referring Provider:   Flowsheet Row PULMONARY REHAB OTHER RESP ORIENTATION from 10/18/2022 in [place]  Referring Provider Dr. Melvyn Novas       Encounter Date: 11/19/2022  Check In:  Session Check In - 11/19/22 1040       Check-In   Supervising physician immediately available to respond to emergencies [facility] MD immediately available    Physician(s) Dr Dellia Cloud    Location [facility]    Staff Present Leana Roe, BS, Exercise Physiologist;Crissie Aloi Hassell Done, RN, BSN    Virtual Visit No    Medication changes reported     No    Fall or balance concerns reported    Yes    Comments Patient says he loses his balance frequently.    Tobacco Cessation No Change    Warm-up and Cool-down Performed as group-led instruction    Resistance Training Performed Yes    VAD Patient? No    PAD/SET Patient? No      Pain Assessment   Currently in Pain? No/denies    Pain Score 0-No pain    Multiple Pain Sites No             Capillary Blood Glucose: No results found for this or any previous visit (from the past 24 hour(s)).    Social History   Tobacco Use  Smoking Status Never  Smokeless Tobacco Never    Goals Met:  Proper associated with RPD/PD & O2 Sat Independence with exercise equipment Using PLB without cueing & demonstrates good technique Exercise tolerated well Queuing for purse lip breathing No report of concerns or symptoms today Strength training completed today  Goals Unmet:  Not Applicable  Comments: Checkout at 1145.   Dr. Kathie Dike is Medical Director for [facility] Pulmonary Rehab.

## 2022-11-21 ENCOUNTER — Encounter (HOSPITAL_COMMUNITY)
Admission: RE | Admit: 2022-11-21 | Discharge: 2022-11-21 | Disposition: A | Discharge status: Home / Self Care | Payer: HMO | Source: Ambulatory Visit | Attending: Internal Medicine | Admitting: Internal Medicine

## 2022-11-21 DIAGNOSIS — J9611 Chronic respiratory failure with hypoxia: Secondary | ICD-10-CM | POA: Diagnosis not present

## 2022-11-21 NOTE — Progress Notes (Signed)
Daily Session Note  Patient Details  Name: Tim Day MRN: [id number] Date of Birth: 01-Sep-1951 Referring Provider:   Flowsheet Row PULMONARY REHAB OTHER RESP ORIENTATION from 10/18/2022 in [place]  Referring Provider Dr. Melvyn Novas       Encounter Date: 11/21/2022  Check In:  Session Check In - 11/21/22 1040       Check-In   Supervising physician immediately available to respond to emergencies [facility] MD immediately available    Physician(s) Dr Dellia Cloud    Location [facility]    Staff Present Leana Roe, BS, Exercise Physiologist;Atheena Spano BSN, RN    Virtual Visit No    Medication changes reported     No    Fall or balance concerns reported    Yes    Comments Patient says he loses his balance frequently.    Tobacco Cessation No Change    Warm-up and Cool-down Performed as group-led instruction    Resistance Training Performed Yes    VAD Patient? No    PAD/SET Patient? No      Pain Assessment   Currently in Pain? No/denies    Pain Score 0-No pain    Multiple Pain Sites No             Capillary Blood Glucose: No results found for this or any previous visit (from the past 24 hour(s)).    Social History   Tobacco Use  Smoking Status Never  Smokeless Tobacco Never    Goals Met:  Proper associated with RPD/PD & O2 Sat Independence with exercise equipment Using PLB without cueing & demonstrates good technique Exercise tolerated well Queuing for purse lip breathing No report of concerns or symptoms today Strength training completed today  Goals Unmet:  Not Applicable  Comments:  check out at 11:45   Dr. Kathie Dike is Medical Director for [facility].

## 2022-11-26 ENCOUNTER — Encounter (HOSPITAL_COMMUNITY)
Admission: RE | Admit: 2022-11-26 | Discharge: 2022-11-26 | Disposition: A | Discharge status: Home / Self Care | Payer: HMO | Source: Ambulatory Visit | Attending: Internal Medicine | Admitting: Internal Medicine

## 2022-11-26 DIAGNOSIS — J9611 Chronic respiratory failure with hypoxia: Secondary | ICD-10-CM

## 2022-11-26 NOTE — Progress Notes (Signed)
Daily Session Note  Patient Details  Name: Tim Day MRN: [id number] Date of Birth: 01/27/1951 Referring Provider:   Flowsheet Row PULMONARY REHAB OTHER RESP ORIENTATION from 10/18/2022 in [place]  Referring Provider Dr. Melvyn Novas       Encounter Date: 11/26/2022  Check In:  Session Check In - 11/26/22 1146       Check-In   Supervising physician immediately available to respond to emergencies [facility] MD immediately available    Physician(s) Dr. Harl Bowie    Location [facility]    Staff Present Leana Roe, BS, Exercise Physiologist;[place] Sherrie George, MS, ACSM-CEP    Virtual Visit No    Medication changes reported     No    Fall or balance concerns reported    Yes    Comments Patient says he loses his balance frequently.    Tobacco Cessation No Change    Warm-up and Cool-down Performed as group-led instruction    Resistance Training Performed Yes    VAD Patient? No    PAD/SET Patient? No      Pain Assessment   Currently in Pain? No/denies    Pain Score 0-No pain    Multiple Pain Sites No             Capillary Blood Glucose: No results found for this or any previous visit (from the past 24 hour(s)).   Exercise Prescription Changes - 11/26/22 1100       Home Exercise Plan   Plans to continue exercise at Home (comment)    Frequency Add 3 additional days to program exercise sessions.    Initial Home Exercises Provided 11/26/22             Social History   Tobacco Use  Smoking Status Never  Smokeless Tobacco Never    Goals Met:  Independence with exercise equipment Exercise tolerated well Personal goals reviewed No report of concerns or symptoms today Strength training completed today  Goals Unmet:  Not Applicable  Comments: check out 1145   Dr. Kathie Dike is Medical Director for [facility] Pul[facility] Re[facility].

## 2022-11-26 NOTE — Progress Notes (Signed)
I have reviewed a Home Exercise Prescription with Tim Day . [name] is  currently exercising at home.  The patient was advised to walk and do his indoor equipments 3 days a week for 30-45 minutes.  Davinci and I discussed how to progress their exercise prescription.  The patient stated that their goals were build up his endurance and to stop/lower the use of oxygen.  The patient stated that they understand the exercise prescription.  We reviewed exercise guidelines, target heart rate during exercise, RPE Scale, weather conditions, NTG use, endpoints for exercise, warmup and cool down.  Patient is encouraged to come to me with any questions. I will continue to follow up with the patient to assist them with progression and safety.

## 2022-11-28 ENCOUNTER — Encounter (HOSPITAL_COMMUNITY)
Admission: RE | Admit: 2022-11-28 | Discharge: 2022-11-28 | Disposition: A | Discharge status: Home / Self Care | Payer: HMO | Source: Ambulatory Visit | Attending: Internal Medicine | Admitting: Internal Medicine

## 2022-11-28 DIAGNOSIS — J9611 Chronic respiratory failure with hypoxia: Secondary | ICD-10-CM | POA: Diagnosis not present

## 2022-11-28 NOTE — Progress Notes (Signed)
Daily Session Note  Patient Details  Name: Tim Day MRN: [id number] Date of Birth: 02/22/1951 Referring Provider:   Flowsheet Row PULMONARY REHAB OTHER RESP ORIENTATION from 10/18/2022 in [place]  Referring Provider Dr. Melvyn Novas       Encounter Date: 11/28/2022  Check In:  Session Check In - 11/28/22 1045       Check-In   Supervising physician immediately available to respond to emergencies [facility] MD immediately available    Physician(s) Dr. Johnsie Cancel    Location [facility]    Staff Present Hoy Register MHA, MS, ACSM-CEP;Melven Sartorius BSN, RN    Virtual Visit No    Medication changes reported     No    Fall or balance concerns reported    Yes    Comments Patient says he loses his balance frequently.    Tobacco Cessation No Change    Warm-up and Cool-down Performed as group-led instruction    Resistance Training Performed Yes    VAD Patient? No    PAD/SET Patient? No      Pain Assessment   Currently in Pain? No/denies    Pain Score 0-No pain    Multiple Pain Sites No             Capillary Blood Glucose: No results found for this or any previous visit (from the past 24 hour(s)).    Social History   Tobacco Use  Smoking Status Never  Smokeless Tobacco Never    Goals Met:  Proper associated with RPD/PD & O2 Sat Independence with exercise equipment Using PLB without cueing & demonstrates good technique Exercise tolerated well Queuing for purse lip breathing No report of concerns or symptoms today Strength training completed today  Goals Unmet:  Not Applicable  Comments: checkout time is 1145   Dr. Kathie Dike is Medical Director for [facility] Pul[facility] Re[facility].

## 2022-12-03 ENCOUNTER — Encounter (HOSPITAL_COMMUNITY): Payer: PPO

## 2022-12-03 DIAGNOSIS — M542 Cervicalgia: Secondary | ICD-10-CM | POA: Diagnosis not present

## 2022-12-03 DIAGNOSIS — M21371 Foot drop, right foot: Secondary | ICD-10-CM | POA: Diagnosis not present

## 2022-12-03 DIAGNOSIS — I5042 Chronic combined systolic (congestive) and diastolic (congestive) heart failure: Secondary | ICD-10-CM | POA: Diagnosis not present

## 2022-12-03 DIAGNOSIS — R972 Elevated prostate specific antigen [PSA]: Secondary | ICD-10-CM | POA: Diagnosis not present

## 2022-12-03 DIAGNOSIS — I10 Essential (primary) hypertension: Secondary | ICD-10-CM | POA: Diagnosis not present

## 2022-12-03 DIAGNOSIS — R809 Proteinuria, unspecified: Secondary | ICD-10-CM | POA: Diagnosis not present

## 2022-12-03 DIAGNOSIS — J9621 Acute and chronic respiratory failure with hypoxia: Secondary | ICD-10-CM | POA: Diagnosis not present

## 2022-12-03 DIAGNOSIS — J9611 Chronic respiratory failure with hypoxia: Secondary | ICD-10-CM | POA: Diagnosis not present

## 2022-12-03 DIAGNOSIS — Z0001 Encounter for general adult medical examination with abnormal findings: Secondary | ICD-10-CM | POA: Diagnosis not present

## 2022-12-03 DIAGNOSIS — E118 Type 2 diabetes mellitus with unspecified complications: Secondary | ICD-10-CM | POA: Diagnosis not present

## 2022-12-03 DIAGNOSIS — E782 Mixed hyperlipidemia: Secondary | ICD-10-CM | POA: Diagnosis not present

## 2022-12-03 DIAGNOSIS — E1169 Type 2 diabetes mellitus with other specified complication: Secondary | ICD-10-CM | POA: Diagnosis not present

## 2022-12-03 IMAGING — XA IR EXCHANGE BILARY DRAIN
2 series · 2 of 2 positions shown · non-contrast
Comparison: none

INDICATION: Cessation of output from cholecystostomy catheter. Scheduled
percutaneous stone removal

[Series 1: single · 1 of 1 slices shown]
[im 1/1]
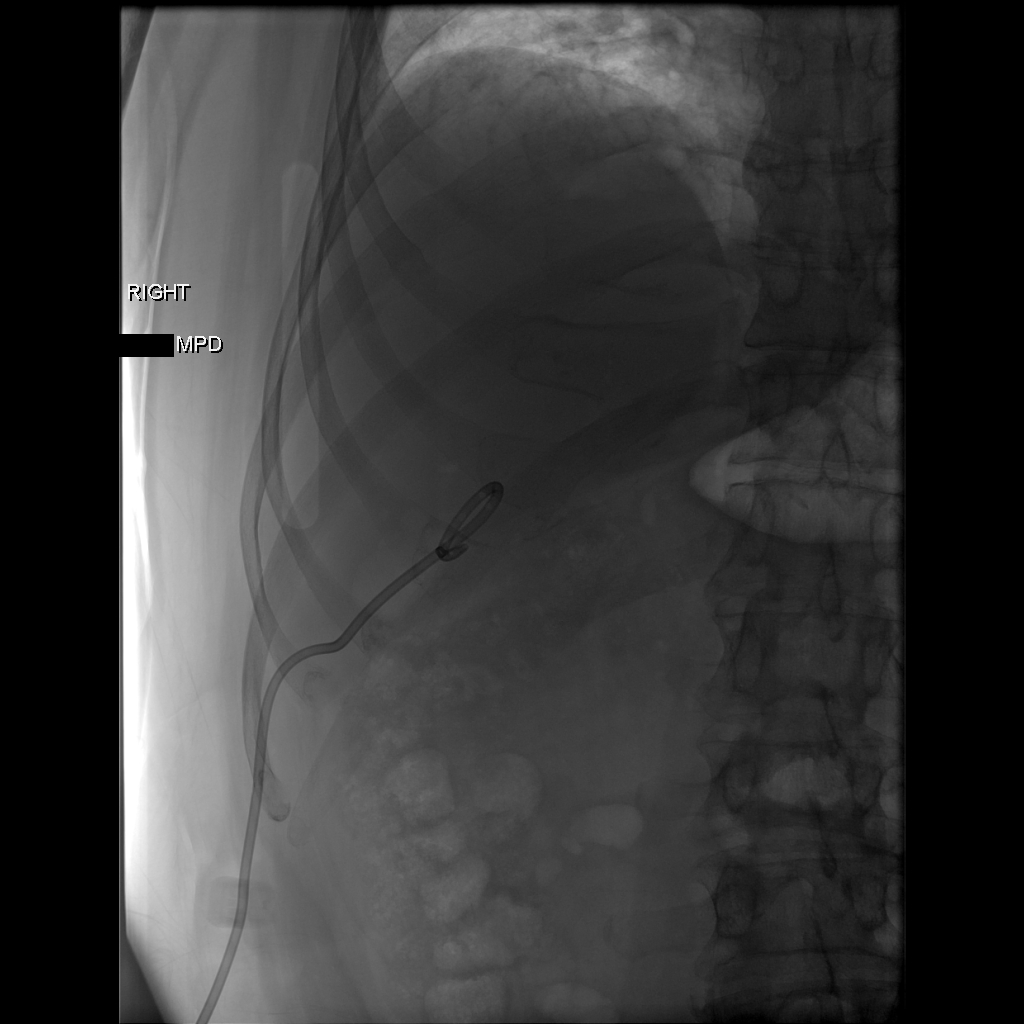

[Series 2: fl (-) angio · 1 of 1 slices shown]
[im 1/1]
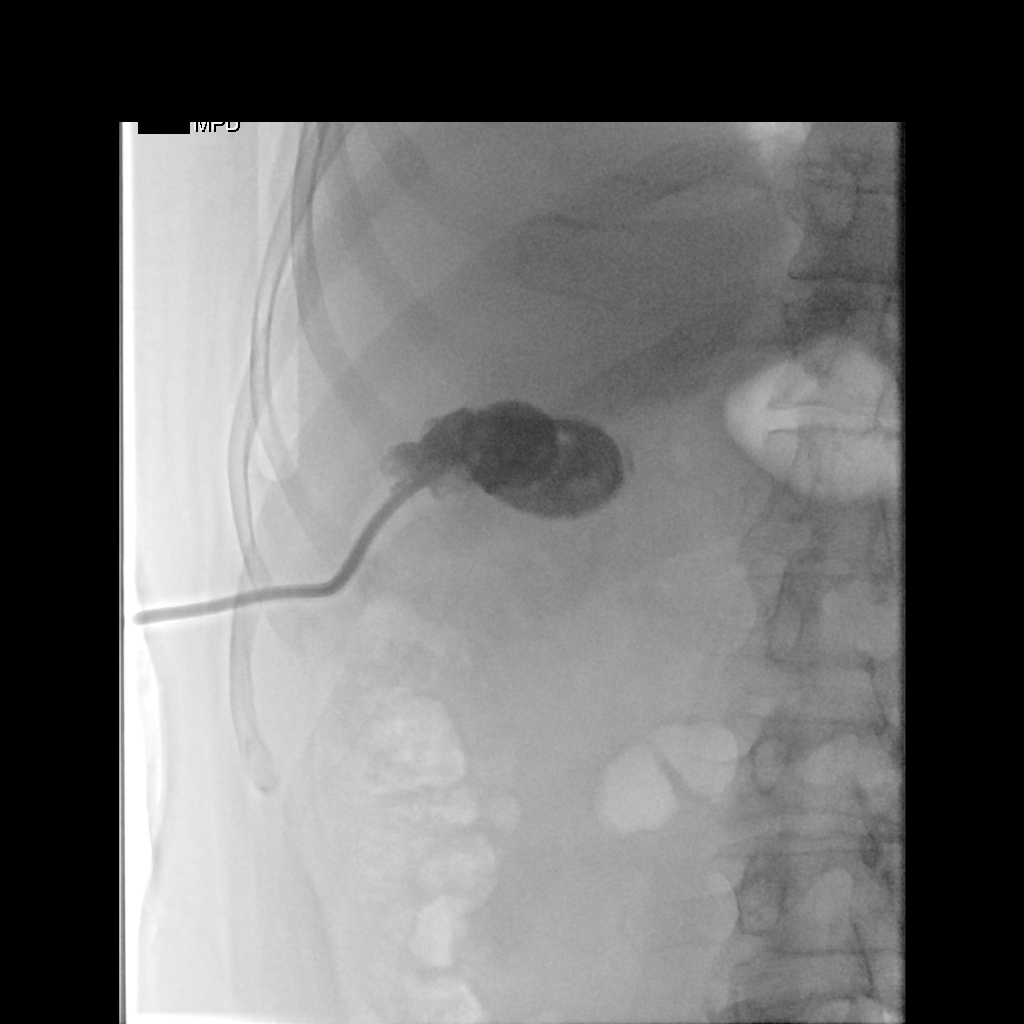

[2 of 2 positions shown; findings below may reference images not displayed]

EXAM:
PERCUTANEOUS CHOLECYSTOSTOMY CATHETER EXCHANGE UNDER FLUOROSCOPY

MEDICATIONS:
1% LIDOCAINE SUBCUTANEOUS

ANESTHESIA/SEDATION:
None required

FLUOROSCOPY TIME:  42 seconds; 5 mGy

COMPLICATIONS:
None immediate.

PROCEDURE:
Informed written consent was obtained from the patient after a
thorough discussion of the procedural risks, benefits and
alternatives. All questions were addressed. Maximal Sterile Barrier
Technique was utilized including caps, mask, sterile gowns, sterile
gloves, sterile drape, hand hygiene and skin antiseptic. A timeout
was performed prior to the initiation of the procedure.

Injection demonstrates that the previously post catheter had
partially withdrawn, position at the margin of gallbladder. The
catheter was cut and exchanged over an Amplatz wire for a new 12
French pigtail drain catheter, formed centrally within the lumen of
the gallbladder. Injection confirms good positioning. Catheter
secured externally with 0 Prolene suture and StatLock and placed to
gravity drain bag. The patient tolerated the procedure well.
IMPRESSION: 1. Technically successful exchange and repositioning of
cholecystostomy catheter, with a 12 French device.

## 2022-12-04 NOTE — Progress Notes (Signed)
Pulmonary Individual Treatment Plan  Patient Details  Name: Tim Day MRN: [id number] Date of Birth: 15-Apr-1951 Referring Provider:   [facility] from 10/18/2022 in [place]  Referring Provider Dr. Melvyn Novas       Initial Encounter Date:  Flowsheet Row PULMONARY REHAB OTHER RESP ORIENTATION from 10/18/2022 in [place]  Date 10/18/22       Visit Diagnosis: Chronic respiratory failure with hypoxia and hypercapnia (HCC)  Patient's Home Medications on Admission:   Current Outpatient Medications:    ACCU-CHEK GUIDE test strip, USE TO TEST TWICE DAILY.D, Disp: , Rfl:    acetaminophen (TYLENOL) 500 MG tablet, Take 1,000 mg by mouth 2 (two) times daily as needed for moderate pain., Disp: , Rfl:    alclomethasone (ACLOVATE) 0.05 % cream, Apply topically 2 (two) times daily as needed (Rash)., Disp: 180 g, Rfl: 3   ALPRAZolam (XANAX) 0.25 MG tablet, Take 0.25 mg by mouth daily as needed for anxiety., Disp: , Rfl:    apixaban (ELIQUIS) 5 MG TABS tablet, Take 1 tablet (5 mg total) by mouth 2 (two) times daily., Disp: 60 tablet, Rfl: 3   ascorbic acid (VITAMIN C) 500 MG tablet, Take 1 tablet (500 mg total) by mouth daily., Disp: 30 tablet, Rfl: 0   blood glucose meter kit and supplies KIT, Dispense based on patient and insurance preference. Use up to four times daily as directed. (FOR ICD-9 250.00, 250.01)., Disp: 1 each, Rfl: 0   buPROPion (WELLBUTRIN XL) 150 MG 24 hr tablet, Take 150 mg by mouth in the morning., Disp: , Rfl:    Cholecalciferol (VITAMIN D3) 125 MCG (5000 UT) CAPS, Take 5,000 Units by mouth in the morning., Disp: , Rfl:    Chromium-Cinnamon (CINNAMON PLUS CHROMIUM PO), Take 1 tablet by mouth in the morning., Disp: , Rfl:    CO-ENZYME Q-10 PO, Take 100 mg by mouth in the morning., Disp: , Rfl:    famotidine (PEPCID) 20 MG tablet, TAKE ONE TABLET BY MOUTH DAILY AFTER SUPPER (Patient  taking differently: Take 20 mg by mouth in the morning.), Disp: 30 tablet, Rfl: 11   FIASP FLEXTOUCH 100 UNIT/ML FlexTouch Pen, Inject 2-8 Units into the skin every evening., Disp: , Rfl:    furosemide (LASIX) 40 MG tablet, Take 1 tablet (40 mg total) by mouth daily., Disp: 90 tablet, Rfl: 3   Insulin Pen Needle (PEN NEEDLES) 32G X 4 MM MISC, 1 Package by Does not apply route 4 (four) times daily -  before meals and at bedtime., Disp: 1 each, Rfl: 0   ipratropium (ATROVENT) 0.06 % nasal spray, Place 2 sprays into both nostrils 4 (four) times daily. (Patient taking differently: Place 2 sprays into both nostrils 4 (four) times daily as needed (allergies.).), Disp: 15 mL, Rfl: 12   Lancets (ONETOUCH DELICA PLUS MCNOBS96G) MISC, Apply topically 2 (two) times daily., Disp: , Rfl:    Multiple Vitamin (MULTIVITAMIN WITH MINERALS) TABS tablet, Take 1 tablet by mouth every evening., Disp: , Rfl:    pantoprazole (PROTONIX) 40 MG tablet, Take 40 mg by mouth at bedtime., Disp: , Rfl:    potassium chloride SA (KLOR-CON M) 20 MEQ tablet, Take 1 tablet (20 mEq total) by mouth daily., Disp: 90 tablet, Rfl: 3   rosuvastatin (CRESTOR) 5 MG tablet, Take 5 mg by mouth at bedtime., Disp: , Rfl:    sertraline (ZOLOFT) 100 MG tablet, Take 100 mg by mouth in the morning., Disp: ,  Rfl:    SYNJARDY XR 12.04-999 MG TB24, Take 1 tablet by mouth 2 (two) times daily., Disp: , Rfl:    tamsulosin (FLOMAX) 0.4 MG CAPS capsule, Take 0.4 mg by mouth at bedtime., Disp: , Rfl:    TOUJEO MAX SOLOSTAR 300 UNIT/ML Solostar Pen, Inject 20 Units into the skin in the morning., Disp: , Rfl:    zinc sulfate 220 (50 Zn) MG capsule, Take 1 capsule (220 mg total) by mouth daily. 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   Primary insomnia    Seborrheic dermatitis, unspecified    Squamous cell carcinoma of skin 06/14/2011   left forearm medial [id number]   Thrombocytopenia, unspecified (HCC)     Tobacco Use: Social History   Tobacco Use  Smoking Status Never  Smokeless Tobacco Never    Labs: Review Flowsheet  More data exists      Latest Ref Rng & Units 04/09/2021 04/10/2021 11/17/2021 05/06/2022 05/07/2022  Labs for ITP Cardiac and Pulmonary Rehab  Cholestrol 0 - 200 mg/dL - 91  - - -  LDL (calc) 0 - 99 mg/dL - 47  - - -  HDL-C >40 mg/dL - 13  - - -  Trlycerides <150 mg/dL - 156  146  - - -  Hemoglobin A1c 4.8 - 5.6 % 7.1  - 6.3  - -  PH, Arterial 7.35 - 7.45 7.350  7.359  C - 7.478  7.325  7.25  7.26  7.33   PCO2 arterial 32 - 48 mmHg 50.2  42.9  C - 32.5  59.5  87  77  84   Bicarbonate 20.0 - 28.0 mmol/L 27.7  23.3  C - 24.2  31.0  38.2  34.6  39.8  34.7  44.3   TCO2 22 - 32 mmol/L 29  - 25  33  - -  Acid-base deficit 0.0 - 2.0 mmol/L 1.1  C - - - -  O2 Saturation % 100.0  99.5  C - 99.0  100.0  97.4  94.7  97.3  96.6  93.9     Details      C Corrected result   Multiple values from one day are sorted in reverse-chronological order         Capillary Blood Glucose: Lab Results  Component Value Date   GLUCAP 224 (H) 10/29/2022   GLUCAP 275 (H) 10/18/2022   GLUCAP 190 (H) 05/11/2022   GLUCAP 131 (H) 05/11/2022   GLUCAP 157 (H) 05/11/2022     Pulmonary Assessment Scores:  Pulmonary Assessment Scores     Row Name 10/18/22 1341         ADL UCSD   ADL Phase Entry     SOB Score total 58     Rest 1     Walk 2     Stairs 4     Bath 2  Dress 2     Shop 3       CAT Score   CAT  Score 20       mMRC Score   mMRC Score 3             UCSD: Self-administered rating of dyspnea associated with activities of daily living (ADLs) 6-point scale (0 = "not at all" to 5 = "maximal or unable to do because of breathlessness")  Scoring Scores range from 0 to 120.  Minimally important difference is 5 units  CAT: CAT can identify the health impairment of COPD patients and is better correlated with disease progression.  CAT has a scoring range of zero to 40. The CAT score is classified into four groups of low (less than 10), medium (10 - 20), high (21-30) and very high (31-40) based on the impact level of disease on health status. A CAT score over 10 suggests significant symptoms.  A worsening CAT score could be explained by an exacerbation, poor medication adherence, poor inhaler technique, or progression of COPD or comorbid conditions.  CAT MCID is 2 points  mMRC: mMRC (Modified Medical Research Council) Dyspnea Scale is used to assess the degree of baseline functional disability in patients of respiratory disease due to dyspnea. No minimal important difference is established. A decrease in score of 1 point or greater is considered a positive change.   Pulmonary Function Assessment:   Exercise Target Goals: Exercise Program Goal: Individual exercise prescription set using results from initial 6 min walk test and THRR while considering  patient's activity barriers and safety.   Exercise Prescription Goal: Initial exercise prescription builds to 30-45 minutes a day of aerobic activity, 2-3 days per week.  Home exercise guidelines will be given to patient during program as part of exercise prescription that the participant will acknowledge.  Activity Barriers & Risk Stratification:  Activity Barriers & Cardiac Risk Stratification - 10/18/22 1244       Activity Barriers & Cardiac Risk Stratification   Activity Barriers Deconditioning;Shortness of Breath    Cardiac Risk  Stratification Moderate             6 Minute Walk:  6 Minute Walk     Row Name 10/18/22 1409         6 Minute Walk   Phase Initial     Distance 700 feet     Walk Time 6 minutes     # of Rest Breaks 2     MPH 1.32     METS 2.18     RPE 12     Perceived Dyspnea  13     Symptoms Yes (comment)     Comments Two standing breaks to due to SOB     Resting HR 93 bpm     Resting BP 90/50     Resting Oxygen Saturation  96 %     Exercise Oxygen Saturation  during 6 min walk 87 %     Max Ex. HR 122 bpm     Max Ex. BP 118/58     2 Minute Post BP 106/58       Interval HR   1 Minute HR 110     2 Minute HR 118     3 Minute HR 120     4 Minute HR 113     5 Minute HR 113     6 Minute HR 122     2 Minute Post HR 102  Interval Heart Rate? Yes       Interval Oxygen   Interval Oxygen? Yes     Baseline Oxygen Saturation % 96 %     1 Minute Oxygen Saturation % 90 %     1 Minute Liters of Oxygen 4 L     2 Minute Oxygen Saturation % 97 %     2 Minute Liters of Oxygen 4 L     3 Minute Oxygen Saturation % 97 %     3 Minute Liters of Oxygen 6 L     4 Minute Oxygen Saturation % 90 %     4 Minute Liters of Oxygen 6 L     5 Minute Oxygen Saturation % 90 %     5 Minute Liters of Oxygen 6 L     6 Minute Oxygen Saturation % 87 %     6 Minute Liters of Oxygen 6 L     2 Minute Post Oxygen Saturation % 94 %     2 Minute Post Liters of Oxygen 6 L              Oxygen Initial Assessment:  Oxygen Initial Assessment - 10/18/22 1344       Home Oxygen   Home Oxygen Device Home Concentrator;E-Tanks    Sleep Oxygen Prescription BiPAP;Continuous    Liters per minute 3    Home Exercise Oxygen Prescription Continuous    Liters per minute 4    Home Resting Oxygen Prescription Continuous    Liters per minute 2    Compliance with Home Oxygen Use Yes      Intervention   Short Term Goals To learn and exhibit compliance with exercise, home and travel O2 prescription;To learn and  understand importance of monitoring SPO2 with pulse oximeter and demonstrate accurate use of the pulse oximeter.;To learn and understand importance of maintaining oxygen saturations>88%;To learn and demonstrate proper pursed lip breathing techniques or other breathing techniques.     Long  Term Goals Exhibits compliance with exercise, home  and travel O2 prescription;Verbalizes importance of monitoring SPO2 with pulse oximeter and return demonstration;Maintenance of O2 saturations>88%;Exhibits proper breathing techniques, such as pursed lip breathing or other method taught during program session             Oxygen Re-Evaluation:  Oxygen Re-Evaluation     Row Name 10/18/22 1342 10/18/22 1343 11/05/22 1317 12/03/22 1010       Program Oxygen Prescription   Program Oxygen Prescription -- -- Continuous Continuous    Liters per minute -- -- 6 6      Home Oxygen   Home Oxygen Device -- -- Home Concentrator;E-Tanks Home Concentrator;E-Tanks    Sleep Oxygen Prescription -- -- BiPAP;Continuous BiPAP;Continuous    Liters per minute -- -- 3 3    Home Exercise Oxygen Prescription -- -- Continuous Continuous    Liters per minute -- -- 4 4    Home Resting Oxygen Prescription -- -- Continuous Continuous    Liters per minute -- -- 2 2    Compliance with Home Oxygen Use -- -- Yes --      Goals/Expected Outcomes   Short Term Goals -- -- To learn and exhibit compliance with exercise, home and travel O2 prescription;To learn and understand importance of monitoring SPO2 with pulse oximeter and demonstrate accurate use of the pulse oximeter.;To learn and understand importance of maintaining oxygen saturations>88%;To learn and demonstrate proper pursed lip breathing techniques or other breathing techniques.  To learn  and exhibit compliance with exercise, home and travel O2 prescription;To learn and understand importance of monitoring SPO2 with pulse oximeter and demonstrate accurate use of the pulse  oximeter.;To learn and understand importance of maintaining oxygen saturations>88%;To learn and demonstrate proper pursed lip breathing techniques or other breathing techniques.     Long  Term Goals -- -- Exhibits compliance with exercise, home  and travel O2 prescription;Verbalizes importance of monitoring SPO2 with pulse oximeter and return demonstration;Maintenance of O2 saturations>88%;Exhibits proper breathing techniques, such as pursed lip breathing or other method taught during program session Exhibits compliance with exercise, home  and travel O2 prescription;Verbalizes importance of monitoring SPO2 with pulse oximeter and return demonstration;Maintenance of O2 saturations>88%;Exhibits proper breathing techniques, such as pursed lip breathing or other method taught during program session    Goals/Expected Outcomes -- -- Complinace Complinace             Oxygen Discharge (Final Oxygen Re-Evaluation):  Oxygen Re-Evaluation - 12/03/22 1010       Program Oxygen Prescription   Program Oxygen Prescription Continuous    Liters per minute 6      Home Oxygen   Home Oxygen Device Home Concentrator;E-Tanks    Sleep Oxygen Prescription BiPAP;Continuous    Liters per minute 3    Home Exercise Oxygen Prescription Continuous    Liters per minute 4    Home Resting Oxygen Prescription Continuous    Liters per minute 2      Goals/Expected Outcomes   Short Term Goals To learn and exhibit compliance with exercise, home and travel O2 prescription;To learn and understand importance of monitoring SPO2 with pulse oximeter and demonstrate accurate use of the pulse oximeter.;To learn and understand importance of maintaining oxygen saturations>88%;To learn and demonstrate proper pursed lip breathing techniques or other breathing techniques.     Long  Term Goals Exhibits compliance with exercise, home  and travel O2 prescription;Verbalizes importance of monitoring SPO2 with pulse oximeter and return  demonstration;Maintenance of O2 saturations>88%;Exhibits proper breathing techniques, such as pursed lip breathing or other method taught during program session    Goals/Expected Outcomes Complinace             Initial Exercise Prescription:  Initial Exercise Prescription - 10/18/22 1400       Date of Initial Exercise RX and Referring Provider   Date 10/18/22    Referring Provider Dr. Melvyn Novas    Expected Discharge Date 02/25/23      Oxygen   Oxygen Continuous    Liters 6    Maintain Oxygen Saturation 88% or higher      NuStep   Level 1    SPM 60    Minutes 17      Arm Ergometer   Level 1    RPM 40    Minutes 22      Prescription Details   Frequency (times per week) 2    Duration Progress to 30 minutes of continuous aerobic without signs/symptoms of physical distress      Intensity   THRR 40-80% of Max Heartrate 60-121    Ratings of Perceived Exertion 11-13    Perceived Dyspnea 0-4      Resistance Training   Training Prescription Yes    Weight 2    Reps 10-15             Perform Capillary Blood Glucose checks as needed.  Exercise Prescription Changes:   Exercise Prescription Changes     Row Name 11/05/22 1300 11/19/22 1100 11/26/22  1100 11/28/22 1100       Response to Exercise   Blood Pressure (Admit) 122/50 108/68 -- 110/70    Blood Pressure (Exercise) 126/50 110/60 -- 118/68    Blood Pressure (Exit) 110/60 104/64 -- 104/64    Heart Rate (Admit) 86 bpm 80 bpm -- 85 bpm    Heart Rate (Exercise) 95 bpm 105 bpm -- 103 bpm    Heart Rate (Exit) 88 bpm 94 bpm -- 93 bpm    Oxygen Saturation (Admit) 93 % 92 % -- 90 %    Oxygen Saturation (Exercise) 93 % 91 % -- 90 %    Oxygen Saturation (Exit) 88 % 95 % -- 90 %    Rating of Perceived Exertion (Exercise) 13 12 -- 12    Perceived Dyspnea (Exercise) 13 12 -- 12    Duration Continue with 30 min of aerobic exercise without signs/symptoms of physical distress. Continue with 30 min of aerobic exercise without  signs/symptoms of physical distress. -- Continue with 30 min of aerobic exercise without signs/symptoms of physical distress.    Intensity THRR unchanged THRR unchanged -- THRR unchanged      Progression   Progression Continue to progress workloads to maintain intensity without signs/symptoms of physical distress. Continue to progress workloads to maintain intensity without signs/symptoms of physical distress. -- Continue to progress workloads to maintain intensity without signs/symptoms of physical distress.      Resistance Training   Training Prescription Yes Yes -- Yes    Weight 5 5 -- 5    Reps 10-15 10-15 -- 10-15    Time 10 Minutes 10 Minutes -- 10 Minutes      Oxygen   Oxygen Continuous Continuous -- Continuous    Liters 6 6 -- 6      NuStep   Level 2 2 -- 2    SPM 93 92 -- 98    Minutes 17 17 -- 17    METs 2.1 2.3 -- 2.3      Arm Ergometer   Level 1 2 -- 2.9    RPM 44 49 -- 49    Minutes 22 22 -- 22    METs 1.6 2 -- 2.3      Home Exercise Plan   Plans to continue exercise at -- -- Home (comment) --    Frequency -- -- Add 3 additional days to program exercise sessions. --    Initial Home Exercises Provided -- -- 11/26/22 --             Exercise Comments:   Exercise Comments     Row Name 11/26/22 1144           Exercise Comments home exercise reviewed                Exercise Goals and Review:   Exercise Goals     Row Name 10/18/22 1416 11/05/22 1315 12/03/22 1007         Exercise Goals   Increase Physical Activity Yes Yes Yes     Intervention Provide advice, education, support and counseling about physical activity/exercise needs.;Develop an individualized exercise prescription for aerobic and resistive training based on initial evaluation findings, risk stratification, comorbidities and participant's personal goals. Provide advice, education, support and counseling about physical activity/exercise needs.;Develop an individualized exercise  prescription for aerobic and resistive training based on initial evaluation findings, risk stratification, comorbidities and participant's personal goals. Provide advice, education, support and counseling about physical activity/exercise needs.;Develop an individualized exercise prescription  for aerobic and resistive training based on initial evaluation findings, risk stratification, comorbidities and participant's personal goals.     Expected Outcomes Short Term: Attend rehab on a regular basis to increase amount of physical activity.;Long Term: Exercising regularly at least 3-5 days a week.;Long Term: Add in home exercise to make exercise part of routine and to increase amount of physical activity. Short Term: Attend rehab on a regular basis to increase amount of physical activity.;Long Term: Exercising regularly at least 3-5 days a week.;Long Term: Add in home exercise to make exercise part of routine and to increase amount of physical activity. Short Term: Attend rehab on a regular basis to increase amount of physical activity.;Long Term: Exercising regularly at least 3-5 days a week.;Long Term: Add in home exercise to make exercise part of routine and to increase amount of physical activity.     Increase Strength and Stamina Yes Yes Yes     Intervention Provide advice, education, support and counseling about physical activity/exercise needs.;Develop an individualized exercise prescription for aerobic and resistive training based on initial evaluation findings, risk stratification, comorbidities and participant's personal goals. Provide advice, education, support and counseling about physical activity/exercise needs.;Develop an individualized exercise prescription for aerobic and resistive training based on initial evaluation findings, risk stratification, comorbidities and participant's personal goals. Provide advice, education, support and counseling about physical activity/exercise needs.;Develop an  individualized exercise prescription for aerobic and resistive training based on initial evaluation findings, risk stratification, comorbidities and participant's personal goals.     Expected Outcomes Short Term: Increase workloads from initial exercise prescription for resistance, speed, and METs.;Short Term: Perform resistance training exercises routinely during rehab and add in resistance training at home;Long Term: Improve cardiorespiratory fitness, muscular endurance and strength as measured by increased METs and functional capacity (6MWT) Short Term: Increase workloads from initial exercise prescription for resistance, speed, and METs.;Short Term: Perform resistance training exercises routinely during rehab and add in resistance training at home;Long Term: Improve cardiorespiratory fitness, muscular endurance and strength as measured by increased METs and functional capacity (6MWT) Short Term: Increase workloads from initial exercise prescription for resistance, speed, and METs.;Short Term: Perform resistance training exercises routinely during rehab and add in resistance training at home;Long Term: Improve cardiorespiratory fitness, muscular endurance and strength as measured by increased METs and functional capacity (6MWT)     Able to understand and use rate of perceived exertion (RPE) scale Yes Yes Yes     Intervention Provide education and explanation on how to use RPE scale Provide education and explanation on how to use RPE scale Provide education and explanation on how to use RPE scale     Expected Outcomes Short Term: Able to use RPE daily in rehab to express subjective intensity level;Long Term:  Able to use RPE to guide intensity level when exercising independently Short Term: Able to use RPE daily in rehab to express subjective intensity level;Long Term:  Able to use RPE to guide intensity level when exercising independently Short Term: Able to use RPE daily in rehab to express subjective  intensity level;Long Term:  Able to use RPE to guide intensity level when exercising independently     Able to understand and use Dyspnea scale Yes Yes Yes     Intervention Provide education and explanation on how to use Dyspnea scale Provide education and explanation on how to use Dyspnea scale Provide education and explanation on how to use Dyspnea scale     Expected Outcomes Short Term: Able to use Dyspnea scale  daily in rehab to express subjective sense of shortness of breath during exertion;Long Term: Able to use Dyspnea scale to guide intensity level when exercising independently Short Term: Able to use Dyspnea scale daily in rehab to express subjective sense of shortness of breath during exertion;Long Term: Able to use Dyspnea scale to guide intensity level when exercising independently Short Term: Able to use Dyspnea scale daily in rehab to express subjective sense of shortness of breath during exertion;Long Term: Able to use Dyspnea scale to guide intensity level when exercising independently     Knowledge and understanding of Target Heart Rate Range (THRR) Yes Yes Yes     Intervention Provide education and explanation of THRR including how the numbers were predicted and where they are located for reference Provide education and explanation of THRR including how the numbers were predicted and where they are located for reference Provide education and explanation of THRR including how the numbers were predicted and where they are located for reference     Expected Outcomes Long Term: Able to use THRR to govern intensity when exercising independently;Short Term: Able to use daily as guideline for intensity in rehab;Short Term: Able to state/look up THRR Long Term: Able to use THRR to govern intensity when exercising independently;Short Term: Able to use daily as guideline for intensity in rehab;Short Term: Able to state/look up THRR Long Term: Able to use THRR to govern intensity when exercising  independently;Short Term: Able to use daily as guideline for intensity in rehab;Short Term: Able to state/look up THRR     Understanding of Exercise Prescription Yes Yes Yes     Intervention Provide education, explanation, and written materials on patient's individual exercise prescription Provide education, explanation, and written materials on patient's individual exercise prescription Provide education, explanation, and written materials on patient's individual exercise prescription     Expected Outcomes Short Term: Able to explain program exercise prescription;Long Term: Able to explain home exercise prescription to exercise independently Short Term: Able to explain program exercise prescription;Long Term: Able to explain home exercise prescription to exercise independently Short Term: Able to explain program exercise prescription;Long Term: Able to explain home exercise prescription to exercise independently              Exercise Goals Re-Evaluation :  Exercise Goals Re-Evaluation     Row Name 11/05/22 1315 12/03/22 1008           Exercise Goal Re-Evaluation   Exercise Goals Review Increase Strength and Stamina;Increase Physical Activity;Able to understand and use rate of perceived exertion (RPE) scale;Able to understand and use Dyspnea scale;Knowledge and understanding of Target Heart Rate Range (THRR);Understanding of Exercise Prescription Increase Physical Activity;Increase Strength and Stamina;Able to understand and use Dyspnea scale;Able to understand and use rate of perceived exertion (RPE) scale;Knowledge and understanding of Target Heart Rate Range (THRR);Understanding of Exercise Prescription      Comments Pt has completed 3 sessions of PR. He is progressing his level on the stepper after 3 classes. He is motivated during class and enjoys coming. He is currently exercising at 2.1 METs on the stepper. Will continue to monitor and progress as able. Pt has completed 10 sessions of PR.  He continues to progress during each session increasing his level. He is motivated and enjoys coming to class. He is currently exercising at home on his own by walking and using his own equipment/weights. He is currently exercising at 2.3 METs on the stepper. Will continue to monitor and progress as able.  Expected Outcomes Through exercise at home and at rehab, the patient will meet their stated goals. Through exercise at home and at rehab, the patient will meet their stated goals.               Discharge Exercise Prescription (Final Exercise Prescription Changes):  Exercise Prescription Changes - 11/28/22 1100       Response to Exercise   Blood Pressure (Admit) 110/70    Blood Pressure (Exercise) 118/68    Blood Pressure (Exit) 104/64    Heart Rate (Admit) 85 bpm    Heart Rate (Exercise) 103 bpm    Heart Rate (Exit) 93 bpm    Oxygen Saturation (Admit) 90 %    Oxygen Saturation (Exercise) 90 %    Oxygen Saturation (Exit) 90 %    Rating of Perceived Exertion (Exercise) 12    Perceived Dyspnea (Exercise) 12    Duration Continue with 30 min of aerobic exercise without signs/symptoms of physical distress.    Intensity THRR unchanged      Progression   Progression Continue to progress workloads to maintain intensity without signs/symptoms of physical distress.      Resistance Training   Training Prescription Yes    Weight 5    Reps 10-15    Time 10 Minutes      Oxygen   Oxygen Continuous    Liters 6      NuStep   Level 2    SPM 98    Minutes 17    METs 2.3      Arm Ergometer   Level 2.9    RPM 49    Minutes 22    METs 2.3             Nutrition:  Target Goals: Understanding of nutrition guidelines, daily intake of sodium <1547m, cholesterol <2050m calories 30% from fat and 7% or less from saturated fats, daily to have 5 or more servings of fruits and vegetables.  Biometrics:  Pre Biometrics - 10/18/22 1416       Pre Biometrics   Height _0  (1.778  m)    Weight 80.6 kg    Waist Circumference 38 inches    Hip Circumference 37 inches    Waist to Hip Ratio 1.03 %    BMI (Calculated) 25.5    Triceps Skinfold 10 mm    Grip Strength 23.4 kg    Flexibility 0 in    Single Leg Stand 0 seconds              Nutrition Therapy Plan and Nutrition Goals:  Nutrition Therapy & Goals - 10/18/22 1335       Personal Nutrition Goals   Comments Patient scored 88 on his diet assessment. Handout provided and explained to he and his wife regarding healthy nutrition. We offer 2 educational sessions on heart healthy nutrition with handouts and assistance with RD referral if patient is interested.      Intervention Plan   Intervention Nutrition handout(s) given to patient.    Expected Outcomes Short Term Goal: Understand basic principles of dietary content, such as calories, fat, sodium, cholesterol and nutrients.             Nutrition Assessments:  Nutrition Assessments - 10/18/22 1335       MEDFICTS Scores   Pre Score 88            MEDIFICTS Score Key: ?70 Need to make dietary changes  40-70 Heart Healthy Diet ? 40 Therapeutic  Level Cholesterol Diet   Picture Your Plate Scores: <25 Unhealthy dietary pattern with much room for improvement. 41-50 Dietary pattern unlikely to meet recommendations for good health and room for improvement. 51-60 More healthful dietary pattern, with some room for improvement.  >60 Healthy dietary pattern, although there may be some specific behaviors that could be improved.    Nutrition Goals Re-Evaluation:   Nutrition Goals Discharge (Final Nutrition Goals Re-Evaluation):   Psychosocial: Target Goals: Acknowledge presence or absence of significant depression and/or stress, maximize coping skills, provide positive support system. Participant is able to verbalize types and ability to use techniques and skills needed for reducing stress and depression.  Initial Review & Psychosocial  Screening:  Initial Psych Review & Screening - 10/18/22 1345       Initial Review   Current issues with Current Depression;Current Anxiety/Panic;Current Sleep Concerns      Family Dynamics   Good Support System? Yes      Barriers   Psychosocial barriers to participate in program There are no identifiable barriers or psychosocial needs.      Screening Interventions   Interventions Encouraged to exercise;Provide feedback about the scores to participant    Expected Outcomes Short Term goal: Identification and review with participant of any Quality of Life or Depression concerns found by scoring the questionnaire.             Quality of Life Scores:  Quality of Life - 10/18/22 1417       Quality of Life   Select Quality of Life      Quality of Life Scores   Health/Function Pre 16.8 %    Socioeconomic Pre 29.14 %    Psych/Spiritual Pre 18 %    Family Pre 30 %    GLOBAL Pre 21.53 %            Scores of 19 and below usually indicate a poorer quality of life in these areas.  A difference of  2-3 points is a clinically meaningful difference.  A difference of 2-3 points in the total score of the Quality of Life Index has been associated with significant improvement in overall quality of life, self-image, physical symptoms, and general health in studies assessing change in quality of life.   PHQ-9: Review Flowsheet       10/18/2022  Depression screen PHQ 2/9  Decreased Interest 1  Down, Depressed, Hopeless 1  PHQ - 2 Score 2  Altered sleeping 2  Tired, decreased energy 1  Change in appetite 0  Feeling bad or failure about yourself  1  Trouble concentrating 0  Moving slowly or fidgety/restless 0  Suicidal thoughts 0  PHQ-9 Score 6  Difficult doing work/chores Somewhat difficult   Interpretation of Total Score  Total Score Depression Severity:  1-4 = Minimal depression, 5-9 = Mild depression, 10-14 = Moderate depression, 15-19 = Moderately severe depression, 20-27  = Severe depression   Psychosocial Evaluation and Intervention:  Psychosocial Evaluation - 10/18/22 1348       Psychosocial Evaluation & Interventions   Interventions Stress management education;Relaxation education;Encouraged to exercise with the program and follow exercise prescription    Comments Patient has no psychosocial barriers identified at his orientation visit. His initial PHQ-9 score was 6. Patient contracted COVID 19 in August of 2021. 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 He also exercised routinely. He was hospitalized for at least a month and was discharged  to a SNF proir to going home with continuous O2. He has had depression since COVID and is currenlty taking Wellbutrin XL 150 mg and Sertraline 100 mg. His wife says his depression has improved a lot and they feel it is managed well with the medication. He has Alprazelam 0.25 mg for anxiety which he does not take a lot of now. He did have a lot of anxiety and fear of getting SOB. He wears a pulse oximetry constantly. His wife says she believes he does this so he will not get so SOB that he panics. He reports difficulty sleeping for the past month. He has not tried any sleep aide. He is currently exercising by walking short distances at least one time daily. He is ready to start the program hoping to be able to do more activities around the house and exercise more.    Expected Outcomes Patient will continue to have no psychosocial barriers identified and his depression will continue to be managed.    Continue Psychosocial Services  No Follow up required             Psychosocial Re-Evaluation:  Psychosocial Re-Evaluation     [place] Name 10/29/22 1108 11/22/22 0738           Psychosocial Re-Evaluation   Comments Pt starting program on 10/29/2022.  Will monitor and help patient reach his goals and improve quality of life. Tim Day has very supportive family.   Tolerating program well.  Maintaining oxygen at 2-6liters with Sats 91-97%.  Pt is on 3 liters during warm-up and on 4-6 liters during execising.  Attendance is consistent.  Seems to enjoy coming to the program and demonstrates an interest in improving his health.  Patient has no current psycosocial issues.      Expected Outcomes Will complete program, meeting both personal and program goals. Will complete program, meeting both personal and program goals.      Interventions Stress management education;Encouraged to attend Cardiac Rehabilitation for the exercise;Encouraged to attend Pulmonary Rehabilitation for the exercise;Relaxation education Stress management education;Encouraged to attend Cardiac Rehabilitation for the exercise;Encouraged to attend Pulmonary Rehabilitation for the exercise;Relaxation education      Continue Psychosocial Services  No Follow up required No Follow up required        Initial Review   Source of Stress Concerns Chronic Illness Chronic Illness  Pt is on continuous home oxygen.               Psychosocial Discharge (Final Psychosocial Re-Evaluation):  Psychosocial Re-Evaluation - 11/22/22 0738       Psychosocial Re-Evaluation   Comments Tim Day has very supportive family.  Tolerating program well.  Maintaining oxygen at 2-6liters with Sats 91-97%.  Pt is on 3 liters during warm-up and on 4-6 liters during execising.  Attendance is consistent.  Seems to enjoy coming to the program and demonstrates an interest in improving his health.  Patient has no current psycosocial issues.    Expected Outcomes Will complete program, meeting both personal and program goals.    Interventions Stress management education;Encouraged to attend Cardiac Rehabilitation for the exercise;Encouraged to attend Pulmonary Rehabilitation for the exercise;Relaxation education    Continue Psychosocial Services  No Follow up required      Initial Review   Source of Stress Concerns Chronic Illness    Pt is on continuous home oxygen.             Education: Education Goals: Education classes will be provided on a weekly basis,  covering required topics. Participant will state understanding/return demonstration of topics presented.  Learning Barriers/Preferences:  Learning Barriers/Preferences - 10/18/22 1336       Learning Barriers/Preferences   Learning Barriers None    Learning Preferences Audio             Education Topics: How Lungs Work and Diseases: - Discuss the anatomy of the lungs and diseases that can affect the lungs, such as COPD.   Exercise: -Discuss the importance of exercise, FITT principles of exercise, normal and abnormal responses to exercise, and how to exercise safely.   Environmental Irritants: -Discuss types of environmental irritants and how to limit exposure to environmental irritants.   Meds/Inhalers and oxygen: - Discuss respiratory medications, definition of an inhaler and oxygen, and the proper way to use an inhaler and oxygen.   Energy Saving Techniques: - Discuss methods to conserve energy and decrease shortness of breath when performing activities of daily living.    Bronchial Hygiene / Breathing Techniques: - Discuss breathing mechanics, pursed-lip breathing technique,  proper posture, effective ways to clear airways, and other functional breathing techniques   Cleaning Equipment: - Provides group verbal and written instruction about the health risks of elevated stress, cause of high stress, and healthy ways to reduce stress.   Nutrition I: Fats: - Discuss the types of cholesterol, what cholesterol does to the body, and how cholesterol levels can be controlled.   Nutrition II: Labels: -Discuss the different components of food labels and how to read food labels. Flowsheet Row PULMONARY REHAB OTHER RESPIRATORY from 11/21/2022 in [place]  Date 10/31/22  Educator HB  Instruction Review Code 1-  Verbalizes Understanding       Respiratory Infections: - Discuss the signs and symptoms of respiratory infections, ways to prevent respiratory infections, and the importance of seeking medical treatment when having a respiratory infection.   Stress I: Signs and Symptoms: - Discuss the causes of stress, how stress may lead to anxiety and depression, and ways to limit stress. Flowsheet Row PULMONARY REHAB OTHER RESPIRATORY from 11/21/2022 in [place]  Date 11/14/22  Educator DF  Instruction Review Code 2- Demonstrated Understanding       Stress II: Relaxation: -Discuss relaxation techniques to limit stress. Flowsheet Row PULMONARY REHAB OTHER RESPIRATORY from 11/21/2022 in [place]  Date 11/21/22  Educator HB  Instruction Review Code 1- Verbalizes Understanding       Oxygen for Home/Travel: - Discuss how to prepare for travel when on oxygen and proper ways to transport and store oxygen to ensure safety.   Knowledge Questionnaire Score:  Knowledge Questionnaire Score - 10/18/22 1336       Knowledge Questionnaire Score   Pre Score 14/18             Core Components/Risk Factors/Patient Goals at Admission:  Personal Goals and Risk Factors at Admission - 10/18/22 1336       Core Components/Risk Factors/Patient Goals on Admission    Weight Management Weight Maintenance    Improve shortness of breath with ADL's Yes    Intervention Provide education, individualized exercise plan and daily activity instruction to help decrease symptoms of SOB with activities of daily living.    Expected Outcomes Short Term: Improve cardiorespiratory fitness to achieve a reduction of symptoms when performing ADLs;Long Term: Be able to perform more ADLs without symptoms or delay the onset of symptoms    Diabetes Yes    Intervention Provide education about signs/symptoms and  action to take for hypo/hyperglycemia.;Provide education about  proper nutrition, including hydration, and aerobic/resistive exercise prescription along with prescribed medications to achieve blood glucose in normal ranges: Fasting glucose 65-99 mg/dL    Expected Outcomes Short Term: Participant verbalizes understanding of the signs/symptoms and immediate care of hyper/hypoglycemia, proper foot care and importance of medication, aerobic/resistive exercise and nutrition plan for blood glucose control.    Personal Goal Other Yes    Personal Goal Patient wants to be able to do normal tasks around the house and be able to exercise. He wants to improve his SOB.    Intervention Patient will attend PR 2 days/week with exercise and education.    Expected Outcomes Patient will complete the program meeting both personal and program goals.             Core Components/Risk Factors/Patient Goals Review:   Goals and Risk Factor Review     Row Name 10/29/22 1111 11/22/22 0750           Core Components/Risk Factors/Patient Goals Review   Personal Goals Review Develop more efficient breathing techniques such as purse lipped breathing and diaphragmatic breathing and practicing self-pacing with activity.;Increase knowledge of respiratory medications and ability to use respiratory devices properly.;Diabetes;Improve shortness of breath with ADL's Develop more efficient breathing techniques such as purse lipped breathing and diaphragmatic breathing and practicing self-pacing with activity.;Increase knowledge of respiratory medications and ability to use respiratory devices properly.;Diabetes;Improve shortness of breath with ADL's      Review To begin program 10/29/2022.  Pt is on Oxygen at 1-2 liters at rest and 5-6 liters with exercise at home.  Has suffered with long COVID since 07/2020, requiring oxygen. To begin program 10/29/2022.  Has suffered with long COVID since 07/2020, requiring oxygen.  Tolerating PR using Nu-Step and AE.  Vital signs are stable.      Expected  Outcomes Plan of care is ongoing.  No further concerns at this time. Plan of care is ongoing.  No further concerns at this time.               Core Components/Risk Factors/Patient Goals at Discharge (Final Review):   Goals and Risk Factor Review - 11/22/22 0750       Core Components/Risk Factors/Patient Goals Review   Personal Goals Review Develop more efficient breathing techniques such as purse lipped breathing and diaphragmatic breathing and practicing self-pacing with activity.;Increase knowledge of respiratory medications and ability to use respiratory devices properly.;Diabetes;Improve shortness of breath with ADL's    Review To begin program 10/29/2022.  Has suffered with long COVID since 07/2020, requiring oxygen.  Tolerating PR using Nu-Step and AE.  Vital signs are stable.    Expected Outcomes Plan of care is ongoing.  No further concerns at this time.             ITP Comments:   Comments: ITP REVIEW Pt is making expected progress toward pulmonary rehab goals after completing 11 sessions. Recommend continued exercise, life style modification, education, and utilization of breathing techniques to increase stamina and strength and decrease shortness of breath with exertion.

## 2022-12-05 ENCOUNTER — Encounter (HOSPITAL_COMMUNITY)
Admission: RE | Admit: 2022-12-05 | Discharge: 2022-12-05 | Disposition: A | Discharge status: Home / Self Care | Payer: PPO | Source: Ambulatory Visit | Attending: Internal Medicine | Admitting: Internal Medicine

## 2022-12-05 DIAGNOSIS — J9611 Chronic respiratory failure with hypoxia: Secondary | ICD-10-CM | POA: Insufficient documentation

## 2022-12-05 DIAGNOSIS — J9612 Chronic respiratory failure with hypercapnia: Secondary | ICD-10-CM | POA: Diagnosis not present

## 2022-12-05 NOTE — Progress Notes (Signed)
Daily Session Note  Patient Details  Name: Tim Day MRN: [id number] Date of Birth: 1951-10-28 Referring Provider:   Flowsheet Row PULMONARY REHAB OTHER RESP ORIENTATION from 10/18/2022 in [place]  Referring Provider Dr. Melvyn Novas       Encounter Date: 12/05/2022  Check In:  Session Check In - 12/05/22 1037       Check-In   Supervising physician immediately available to respond to emergencies [facility] MD immediately available    Physician(s) Dr. Domenic Polite    Location [facility]    Staff Present Leana Roe, BS, Exercise Physiologist;Jackquline Branca BSN, RN    Virtual Visit No    Medication changes reported     No    Fall or balance concerns reported    Yes    Comments Patient says he loses his balance frequently.    Tobacco Cessation No Change    Warm-up and Cool-down Performed as group-led instruction    Resistance Training Performed Yes    VAD Patient? No    PAD/SET Patient? No      Pain Assessment   Currently in Pain? No/denies    Pain Score 0-No pain    Multiple Pain Sites No             Capillary Blood Glucose: No results found for this or any previous visit (from the past 24 hour(s)).    Social History   Tobacco Use  Smoking Status Never  Smokeless Tobacco Never    Goals Met:  Proper associated with RPD/PD & O2 Sat Independence with exercise equipment Using PLB without cueing & demonstrates good technique Exercise tolerated well Queuing for purse lip breathing No report of concerns or symptoms today Strength training completed today  Goals Unmet:  Not Applicable  Comments: check out at 11:45   Dr. Kathie Dike is Medical Director for [facility].

## 2022-12-10 ENCOUNTER — Encounter (HOSPITAL_COMMUNITY)
Admission: RE | Admit: 2022-12-10 | Discharge: 2022-12-10 | Disposition: A | Discharge status: Home / Self Care | Payer: PPO | Source: Ambulatory Visit | Attending: Internal Medicine | Admitting: Internal Medicine

## 2022-12-10 DIAGNOSIS — J9611 Chronic respiratory failure with hypoxia: Secondary | ICD-10-CM | POA: Diagnosis not present

## 2022-12-10 DIAGNOSIS — E1151 Type 2 diabetes mellitus with diabetic peripheral angiopathy without gangrene: Secondary | ICD-10-CM | POA: Diagnosis not present

## 2022-12-10 NOTE — Progress Notes (Signed)
Daily Session Note  Patient Details  Name: Tim Day MRN: [id number] Date of Birth: October 23, 1951 Referring Provider:   Flowsheet Row PULMONARY REHAB OTHER RESP ORIENTATION from 10/18/2022 in [place]  Referring Provider Dr. Melvyn Novas       Encounter Date: 12/10/2022  Check In:  Session Check In - 12/10/22 1321       Check-In   Supervising physician immediately available to respond to emergencies [facility] MD immediately available    Physician(s) Dr Dellia Cloud    Location [facility]    Staff Present Leana Roe, BS, Exercise Physiologist;Hillary Troutman BSN, RN;Oriana Horiuchi Hassell Done, RN, BSN;Dalton Sherrie George, MS, ACSM-CEP    Virtual Visit No    Medication changes reported     No    Fall or balance concerns reported    Yes    Comments Patient says he loses his balance frequently.    Tobacco Cessation No Change    Warm-up and Cool-down Performed as group-led instruction    Resistance Training Performed Yes    VAD Patient? No    PAD/SET Patient? No      Pain Assessment   Currently in Pain? No/denies    Pain Score 0-No pain    Multiple Pain Sites No             Capillary Blood Glucose: No results found for this or any previous visit (from the past 24 hour(s)).    Social History   Tobacco Use  Smoking Status Never  Smokeless Tobacco Never    Goals Met:  Proper associated with RPD/PD & O2 Sat Independence with exercise equipment Using PLB without cueing & demonstrates good technique Exercise tolerated well Queuing for purse lip breathing No report of concerns or symptoms today Strength training completed today  Goals Unmet:  Not Applicable  Comments: Checkout at 1430.   Dr. Kathie Dike is Medical Director for [facility].

## 2022-12-11 DIAGNOSIS — J9611 Chronic respiratory failure with hypoxia: Secondary | ICD-10-CM | POA: Diagnosis not present

## 2022-12-11 DIAGNOSIS — I5040 Unspecified combined systolic (congestive) and diastolic (congestive) heart failure: Secondary | ICD-10-CM | POA: Diagnosis not present

## 2022-12-11 DIAGNOSIS — J8410 Pulmonary fibrosis, unspecified: Secondary | ICD-10-CM | POA: Diagnosis not present

## 2022-12-12 ENCOUNTER — Encounter (HOSPITAL_COMMUNITY)
Admission: RE | Admit: 2022-12-12 | Discharge: 2022-12-12 | Disposition: A | Discharge status: Home / Self Care | Payer: PPO | Source: Ambulatory Visit | Attending: Internal Medicine | Admitting: Internal Medicine

## 2022-12-12 DIAGNOSIS — J9611 Chronic respiratory failure with hypoxia: Secondary | ICD-10-CM

## 2022-12-12 NOTE — Progress Notes (Signed)
Daily Session Note  Patient Details  Name: Tim Day: [id number] Date of Birth: 1951/11/11 Referring Provider:   Flowsheet Row PULMONARY REHAB OTHER RESP ORIENTATION from 10/18/2022 in [place]  Referring Provider Dr. Melvyn Novas       Encounter Date: 12/12/2022  Check In:  Session Check In - 12/12/22 1041       Check-In   Supervising physician immediately available to respond to emergencies [facility] MD immediately available    Physician(s) Dr Dellia Cloud    Location [facility]    Staff Present Leana Roe, BS, Exercise Physiologist;Nafisa Olds BSN, RN    Virtual Visit No    Medication changes reported     No    Fall or balance concerns reported    Yes    Comments Patient says he loses his balance frequently.    Tobacco Cessation No Change    Warm-up and Cool-down Performed as group-led instruction    Resistance Training Performed Yes    VAD Patient? No    PAD/SET Patient? No      Pain Assessment   Currently in Pain? No/denies    Pain Score 0-No pain    Multiple Pain Sites No             Capillary Blood Glucose: No results found for this or any previous visit (from the past 24 hour(s)).    Social History   Tobacco Use  Smoking Status Never  Smokeless Tobacco Never    Goals Met:  Proper associated with RPD/PD & O2 Sat Independence with exercise equipment Using PLB without cueing & demonstrates good technique Exercise tolerated well Queuing for purse lip breathing No report of concerns or symptoms today Strength training completed today  Goals Unmet:  Not Applicable  Comments: check out at 11:45   Dr. Kathie Dike is Medical Director for [facility].

## 2022-12-17 ENCOUNTER — Encounter (HOSPITAL_COMMUNITY)
Admission: RE | Admit: 2022-12-17 | Discharge: 2022-12-17 | Disposition: A | Discharge status: Home / Self Care | Payer: PPO | Source: Ambulatory Visit | Attending: Internal Medicine | Admitting: Internal Medicine

## 2022-12-17 VITALS — Wt 176.6 lb

## 2022-12-17 DIAGNOSIS — J9611 Chronic respiratory failure with hypoxia: Secondary | ICD-10-CM | POA: Diagnosis not present

## 2022-12-17 NOTE — Progress Notes (Signed)
Daily Session Note  Patient Details  Name: JAQUAVIS FELMLEE MRN: [id number] Date of Birth: 03-13-1951 Referring Provider:   Flowsheet Row PULMONARY REHAB OTHER RESP ORIENTATION from 10/18/2022 in [place]  Referring Provider Dr. Melvyn Novas       Encounter Date: 12/17/2022  Check In:  Session Check In - 12/17/22 1042       Check-In   Supervising physician immediately available to respond to emergencies [facility] MD immediately available    Physician(s) Dr Harl Bowie    Location [facility]    Staff Present Leana Roe, BS, Exercise Physiologist;Aniceto Kyser Hassell Done, RN, BSN    Virtual Visit No    Medication changes reported     No    Fall or balance concerns reported    Yes    Comments Patient says he loses his balance frequently.    Tobacco Cessation No Change    Warm-up and Cool-down Performed as group-led instruction    Resistance Training Performed Yes    VAD Patient? No    PAD/SET Patient? No      Pain Assessment   Currently in Pain? No/denies    Pain Score 0-No pain    Multiple Pain Sites No             Capillary Blood Glucose: No results found for this or any previous visit (from the past 24 hour(s)).    Social History   Tobacco Use  Smoking Status Never  Smokeless Tobacco Never    Goals Met:  Proper associated with RPD/PD & O2 Sat Independence with exercise equipment Using PLB without cueing & demonstrates good technique Exercise tolerated well Queuing for purse lip breathing No report of concerns or symptoms today Strength training completed today  Goals Unmet:  Not Applicable  Comments: Checkout at 1145.   Dr. Kathie Dike is Medical Director for [facility].

## 2022-12-19 ENCOUNTER — Encounter (HOSPITAL_COMMUNITY)
Admission: RE | Admit: 2022-12-19 | Discharge: 2022-12-19 | Disposition: A | Discharge status: Home / Self Care | Payer: PPO | Source: Ambulatory Visit | Attending: Internal Medicine | Admitting: Internal Medicine

## 2022-12-19 DIAGNOSIS — J9611 Chronic respiratory failure with hypoxia: Secondary | ICD-10-CM | POA: Diagnosis not present

## 2022-12-19 NOTE — Progress Notes (Signed)
Daily Session Note  Patient Details  Name: Tim Day MRN: [id number] Date of Birth: 11/29/1951 Referring Provider:   Flowsheet Row PULMONARY REHAB OTHER RESP ORIENTATION from 10/18/2022 in [place]  Referring Provider Dr. Melvyn Novas       Encounter Date: 12/19/2022  Check In:  Session Check In - 12/19/22 1041       Check-In   Supervising physician immediately available to respond to emergencies [facility] MD immediately available    Physician(s) Dr. Carlyle Dolly    Location [facility]    Staff Present Aundra Dubin, RN, BSN;Heather Mel Almond, BS, Exercise Physiologist    Virtual Visit No    Medication changes reported     No    Fall or balance concerns reported    Yes    Comments Patient says he loses his balance frequently.    Tobacco Cessation No Change    Warm-up and Cool-down Performed as group-led instruction    Resistance Training Performed Yes    VAD Patient? No    PAD/SET Patient? No      Pain Assessment   Currently in Pain? No/denies    Pain Score 0-No pain    Multiple Pain Sites No             Capillary Blood Glucose: No results found for this or any previous visit (from the past 24 hour(s)).    Social History   Tobacco Use  Smoking Status Never  Smokeless Tobacco Never    Goals Met:  Proper associated with RPD/PD & O2 Sat Independence with exercise equipment Using PLB without cueing & demonstrates good technique Exercise tolerated well No report of concerns or symptoms today Strength training completed today  Goals Unmet:  Not Applicable  Comments: Check out 1145.   Dr. Kathie Dike is Medical Director for [facility].

## 2022-12-24 ENCOUNTER — Encounter (HOSPITAL_COMMUNITY)
Admission: RE | Admit: 2022-12-24 | Discharge: 2022-12-24 | Disposition: A | Discharge status: Home / Self Care | Payer: PPO | Source: Ambulatory Visit | Attending: Internal Medicine | Admitting: Internal Medicine

## 2022-12-24 DIAGNOSIS — J9611 Chronic respiratory failure with hypoxia: Secondary | ICD-10-CM | POA: Diagnosis not present

## 2022-12-24 NOTE — Progress Notes (Signed)
Daily Session Note  Patient Details  Name: Tim Day MRN: [id number] Date of Birth: [birth year] Referring Provider:   [place] from 10/18/2022 in [place]  Referring Provider Dr. Melvyn Novas       Encounter Date: 12/24/2022  Check In:  Session Check In - 12/24/22 1300       Check-In   Supervising physician immediately available to respond to emergencies [facility] MD immediately available    Physician(s) Dr Domenic Polite    Location [facility]    Staff Present Leana Roe, BS, Exercise Physiologist;Gadge Hermiz Hassell Done, RN, BSN;Dalton Sherrie George, MS, ACSM-CEP    Virtual Visit No    Medication changes reported     No    Fall or balance concerns reported    Yes    Comments Patient says he loses his balance frequently.    Tobacco Cessation No Change    Warm-up and Cool-down Performed as group-led instruction    Resistance Training Performed Yes    VAD Patient? No    PAD/SET Patient? No      Pain Assessment   Currently in Pain? No/denies    Pain Score 0-No pain    Multiple Pain Sites No             Capillary Blood Glucose: No results found for this or any previous visit (from the past 24 hour(s)).    Social History   Tobacco Use  Smoking Status Never  Smokeless Tobacco Never    Goals Met:  Proper associated with RPD/PD & O2 Sat Independence with exercise equipment Using PLB without cueing & demonstrates good technique Exercise tolerated well Queuing for purse lip breathing No report of concerns or symptoms today Strength training completed today  Goals Unmet:  Not Applicable  Comments: Checkout at 1430.   Dr. Kathie Dike is Medical Director for [facility].

## 2022-12-26 ENCOUNTER — Encounter (HOSPITAL_COMMUNITY)
Admission: RE | Admit: 2022-12-26 | Discharge: 2022-12-26 | Disposition: A | Discharge status: Home / Self Care | Payer: PPO | Source: Ambulatory Visit | Attending: Internal Medicine | Admitting: Internal Medicine

## 2022-12-26 DIAGNOSIS — J9611 Chronic respiratory failure with hypoxia: Secondary | ICD-10-CM | POA: Diagnosis not present

## 2022-12-26 NOTE — Progress Notes (Signed)
Daily Session Note  Patient Details  Name: Tim Day MRN: [id number] Date of Birth: 1951-10-24 Referring Provider:   Flowsheet Row PULMONARY REHAB OTHER RESP ORIENTATION from 10/18/2022 in [place]  Referring Provider Dr. Melvyn Novas       Encounter Date: 12/26/2022  Check In:  Session Check In - 12/26/22 1045       Check-In   Supervising physician immediately available to respond to emergencies [facility] MD immediately available    Physician(s) Dr Domenic Polite    Location [facility]    Staff Present Leana Roe, BS, Exercise Physiologist;Grissel Tyrell BSN, RN    Virtual Visit No    Medication changes reported     No    Fall or balance concerns reported    Yes    Comments Patient says he loses his balance frequently.    Tobacco Cessation No Change    Warm-up and Cool-down Performed as group-led instruction    Resistance Training Performed Yes    VAD Patient? No    PAD/SET Patient? No      Pain Assessment   Currently in Pain? No/denies    Pain Score 0-No pain    Multiple Pain Sites No             Capillary Blood Glucose: No results found for this or any previous visit (from the past 24 hour(s)).    Social History   Tobacco Use  Smoking Status Never  Smokeless Tobacco Never    Goals Met:  Proper associated with RPD/PD & O2 Sat Independence with exercise equipment Using PLB without cueing & demonstrates good technique Exercise tolerated well Queuing for purse lip breathing No report of concerns or symptoms today Strength training completed today  Goals Unmet:  Not Applicable  Comments: check out at 11:45   Dr. Kathie Dike is Medical Director for [facility].

## 2022-12-28 DIAGNOSIS — J8410 Pulmonary fibrosis, unspecified: Secondary | ICD-10-CM | POA: Diagnosis not present

## 2022-12-28 DIAGNOSIS — I5040 Unspecified combined systolic (congestive) and diastolic (congestive) heart failure: Secondary | ICD-10-CM | POA: Diagnosis not present

## 2022-12-28 DIAGNOSIS — J9611 Chronic respiratory failure with hypoxia: Secondary | ICD-10-CM | POA: Diagnosis not present

## 2022-12-31 ENCOUNTER — Encounter (HOSPITAL_COMMUNITY)
Admission: RE | Admit: 2022-12-31 | Discharge: 2022-12-31 | Disposition: A | Discharge status: Home / Self Care | Payer: PPO | Source: Ambulatory Visit | Attending: Internal Medicine | Admitting: Internal Medicine

## 2022-12-31 VITALS — Wt 178.8 lb

## 2022-12-31 DIAGNOSIS — J9611 Chronic respiratory failure with hypoxia: Secondary | ICD-10-CM | POA: Diagnosis not present

## 2022-12-31 NOTE — Progress Notes (Signed)
Daily Session Note  Patient Details  Name: Tim Day MRN: [id number] Date of Birth: 03/27/1951 Referring Provider:   Flowsheet Row PULMONARY REHAB OTHER RESP ORIENTATION from 10/18/2022 in [place]  Referring Provider Dr. Melvyn Novas       Encounter Date: 12/31/2022  Check In:  Session Check In - 12/31/22 1045       Check-In   Supervising physician immediately available to respond to emergencies [facility] MD immediately available    Physician(s) Dr Dellia Cloud    Location [facility]    Staff Present Leana Roe, BS, Exercise Physiologist;Fraser Busche Hassell Done, RN, BSN    Virtual Visit No    Medication changes reported     No    Fall or balance concerns reported    Yes    Comments Patient says he loses his balance frequently.    Tobacco Cessation No Change    Warm-up and Cool-down Performed as group-led instruction    Resistance Training Performed Yes    VAD Patient? No    PAD/SET Patient? No      Pain Assessment   Currently in Pain? No/denies    Pain Score 0-No pain    Multiple Pain Sites No             Capillary Blood Glucose: No results found for this or any previous visit (from the past 24 hour(s)).    Social History   Tobacco Use  Smoking Status Never  Smokeless Tobacco Never    Goals Met:  Proper associated with RPD/PD & O2 Sat Independence with exercise equipment Using PLB without cueing & demonstrates good technique Exercise tolerated well Queuing for purse lip breathing No report of concerns or symptoms today Strength training completed today  Goals Unmet:  Not Applicable  Comments: Checkout out at 1145.   Dr. Kathie Dike is Medical Director for [facility].

## 2023-01-01 NOTE — Progress Notes (Signed)
Pulmonary Individual Treatment Plan  Patient Details  Name: Tim Day MRN: [id number] Date of Birth: 15-Apr-1951 Referring Provider:   [facility] from 10/18/2022 in [place]  Referring Provider Dr. Melvyn Novas       Initial Encounter Date:  Flowsheet Row PULMONARY REHAB OTHER RESP ORIENTATION from 10/18/2022 in [place]  Date 10/18/22       Visit Diagnosis: Chronic respiratory failure with hypoxia and hypercapnia (HCC)  Patient's Home Medications on Admission:   Current Outpatient Medications:    ACCU-CHEK GUIDE test strip, USE TO TEST TWICE DAILY.D, Disp: , Rfl:    acetaminophen (TYLENOL) 500 MG tablet, Take 1,000 mg by mouth 2 (two) times daily as needed for moderate pain., Disp: , Rfl:    alclomethasone (ACLOVATE) 0.05 % cream, Apply topically 2 (two) times daily as needed (Rash)., Disp: 180 g, Rfl: 3   ALPRAZolam (XANAX) 0.25 MG tablet, Take 0.25 mg by mouth daily as needed for anxiety., Disp: , Rfl:    apixaban (ELIQUIS) 5 MG TABS tablet, Take 1 tablet (5 mg total) by mouth 2 (two) times daily., Disp: 60 tablet, Rfl: 3   ascorbic acid (VITAMIN C) 500 MG tablet, Take 1 tablet (500 mg total) by mouth daily., Disp: 30 tablet, Rfl: 0   blood glucose meter kit and supplies KIT, Dispense based on patient and insurance preference. Use up to four times daily as directed. (FOR ICD-9 250.00, 250.01)., Disp: 1 each, Rfl: 0   buPROPion (WELLBUTRIN XL) 150 MG 24 hr tablet, Take 150 mg by mouth in the morning., Disp: , Rfl:    Cholecalciferol (VITAMIN D3) 125 MCG (5000 UT) CAPS, Take 5,000 Units by mouth in the morning., Disp: , Rfl:    Chromium-Cinnamon (CINNAMON PLUS CHROMIUM PO), Take 1 tablet by mouth in the morning., Disp: , Rfl:    CO-ENZYME Q-10 PO, Take 100 mg by mouth in the morning., Disp: , Rfl:    famotidine (PEPCID) 20 MG tablet, TAKE ONE TABLET BY MOUTH DAILY AFTER SUPPER (Patient  taking differently: Take 20 mg by mouth in the morning.), Disp: 30 tablet, Rfl: 11   FIASP FLEXTOUCH 100 UNIT/ML FlexTouch Pen, Inject 2-8 Units into the skin every evening., Disp: , Rfl:    furosemide (LASIX) 40 MG tablet, Take 1 tablet (40 mg total) by mouth daily., Disp: 90 tablet, Rfl: 3   Insulin Pen Needle (PEN NEEDLES) 32G X 4 MM MISC, 1 Package by Does not apply route 4 (four) times daily -  before meals and at bedtime., Disp: 1 each, Rfl: 0   ipratropium (ATROVENT) 0.06 % nasal spray, Place 2 sprays into both nostrils 4 (four) times daily. (Patient taking differently: Place 2 sprays into both nostrils 4 (four) times daily as needed (allergies.).), Disp: 15 mL, Rfl: 12   Lancets (ONETOUCH DELICA PLUS MCNOBS96G) MISC, Apply topically 2 (two) times daily., Disp: , Rfl:    Multiple Vitamin (MULTIVITAMIN WITH MINERALS) TABS tablet, Take 1 tablet by mouth every evening., Disp: , Rfl:    pantoprazole (PROTONIX) 40 MG tablet, Take 40 mg by mouth at bedtime., Disp: , Rfl:    potassium chloride SA (KLOR-CON M) 20 MEQ tablet, Take 1 tablet (20 mEq total) by mouth daily., Disp: 90 tablet, Rfl: 3   rosuvastatin (CRESTOR) 5 MG tablet, Take 5 mg by mouth at bedtime., Disp: , Rfl:    sertraline (ZOLOFT) 100 MG tablet, Take 100 mg by mouth in the morning., Disp: ,  Rfl:    SYNJARDY XR 12.04-999 MG TB24, Take 1 tablet by mouth 2 (two) times daily., Disp: , Rfl:    tamsulosin (FLOMAX) 0.4 MG CAPS capsule, Take 0.4 mg by mouth at bedtime., Disp: , Rfl:    TOUJEO MAX SOLOSTAR 300 UNIT/ML Solostar Pen, Inject 20 Units into the skin in the morning., Disp: , Rfl:    zinc sulfate 220 (50 Zn) MG capsule, Take 1 capsule (220 mg total) by mouth daily. 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   Primary insomnia    Seborrheic dermatitis, unspecified    Squamous cell carcinoma of skin 06/14/2011   left forearm medial [id number]   Thrombocytopenia, unspecified (HCC)     Tobacco Use: Social History   Tobacco Use  Smoking Status Never  Smokeless Tobacco Never    Labs: Review Flowsheet  More data exists      Latest Ref Rng & Units 04/09/2021 04/10/2021 11/17/2021 05/06/2022 05/07/2022  Labs for ITP Cardiac and Pulmonary Rehab  Cholestrol 0 - 200 mg/dL - 91  - - -  LDL (calc) 0 - 99 mg/dL - 47  - - -  HDL-C >40 mg/dL - 13  - - -  Trlycerides <150 mg/dL - 156  146  - - -  Hemoglobin A1c 4.8 - 5.6 % 7.1  - 6.3  - -  PH, Arterial 7.35 - 7.45 7.350  7.359  C - 7.478  7.325  7.25  7.26  7.33   PCO2 arterial 32 - 48 mmHg 50.2  42.9  C - 32.5  59.5  87  77  84   Bicarbonate 20.0 - 28.0 mmol/L 27.7  23.3  C - 24.2  31.0  38.2  34.6  39.8  34.7  44.3   TCO2 22 - 32 mmol/L 29  - 25  33  - -  Acid-base deficit 0.0 - 2.0 mmol/L 1.1  C - - - -  O2 Saturation % 100.0  99.5  C - 99.0  100.0  97.4  94.7  97.3  96.6  93.9     Details      C Corrected result   Multiple values from one day are sorted in reverse-chronological order         Capillary Blood Glucose: Lab Results  Component Value Date   GLUCAP 224 (H) 10/29/2022   GLUCAP 275 (H) 10/18/2022   GLUCAP 190 (H) 05/11/2022   GLUCAP 131 (H) 05/11/2022   GLUCAP 157 (H) 05/11/2022     Pulmonary Assessment Scores:  Pulmonary Assessment Scores     Row Name 10/18/22 1341         ADL UCSD   ADL Phase Entry     SOB Score total 58     Rest 1     Walk 2     Stairs 4     Bath 2  Dress 2     Shop 3       CAT Score   CAT  Score 20       mMRC Score   mMRC Score 3             UCSD: Self-administered rating of dyspnea associated with activities of daily living (ADLs) 6-point scale (0 = "not at all" to 5 = "maximal or unable to do because of breathlessness")  Scoring Scores range from 0 to 120.  Minimally important difference is 5 units  CAT: CAT can identify the health impairment of COPD patients and is better correlated with disease progression.  CAT has a scoring range of zero to 40. The CAT score is classified into four groups of low (less than 10), medium (10 - 20), high (21-30) and very high (31-40) based on the impact level of disease on health status. A CAT score over 10 suggests significant symptoms.  A worsening CAT score could be explained by an exacerbation, poor medication adherence, poor inhaler technique, or progression of COPD or comorbid conditions.  CAT MCID is 2 points  mMRC: mMRC (Modified Medical Research Council) Dyspnea Scale is used to assess the degree of baseline functional disability in patients of respiratory disease due to dyspnea. No minimal important difference is established. A decrease in score of 1 point or greater is considered a positive change.   Pulmonary Function Assessment:   Exercise Target Goals: Exercise Program Goal: Individual exercise prescription set using results from initial 6 min walk test and THRR while considering  patient's activity barriers and safety.   Exercise Prescription Goal: Initial exercise prescription builds to 30-45 minutes a day of aerobic activity, 2-3 days per week.  Home exercise guidelines will be given to patient during program as part of exercise prescription that the participant will acknowledge.  Activity Barriers & Risk Stratification:  Activity Barriers & Cardiac Risk Stratification - 10/18/22 1244       Activity Barriers & Cardiac Risk Stratification   Activity Barriers Deconditioning;Shortness of Breath    Cardiac Risk  Stratification Moderate             6 Minute Walk:  6 Minute Walk     Row Name 10/18/22 1409         6 Minute Walk   Phase Initial     Distance 700 feet     Walk Time 6 minutes     # of Rest Breaks 2     MPH 1.32     METS 2.18     RPE 12     Perceived Dyspnea  13     Symptoms Yes (comment)     Comments Two standing breaks to due to SOB     Resting HR 93 bpm     Resting BP 90/50     Resting Oxygen Saturation  96 %     Exercise Oxygen Saturation  during 6 min walk 87 %     Max Ex. HR 122 bpm     Max Ex. BP 118/58     2 Minute Post BP 106/58       Interval HR   1 Minute HR 110     2 Minute HR 118     3 Minute HR 120     4 Minute HR 113     5 Minute HR 113     6 Minute HR 122     2 Minute Post HR 102  Interval Heart Rate? Yes       Interval Oxygen   Interval Oxygen? Yes     Baseline Oxygen Saturation % 96 %     1 Minute Oxygen Saturation % 90 %     1 Minute Liters of Oxygen 4 L     2 Minute Oxygen Saturation % 97 %     2 Minute Liters of Oxygen 4 L     3 Minute Oxygen Saturation % 97 %     3 Minute Liters of Oxygen 6 L     4 Minute Oxygen Saturation % 90 %     4 Minute Liters of Oxygen 6 L     5 Minute Oxygen Saturation % 90 %     5 Minute Liters of Oxygen 6 L     6 Minute Oxygen Saturation % 87 %     6 Minute Liters of Oxygen 6 L     2 Minute Post Oxygen Saturation % 94 %     2 Minute Post Liters of Oxygen 6 L              Oxygen Initial Assessment:  Oxygen Initial Assessment - 10/18/22 1344       Home Oxygen   Home Oxygen Device Home Concentrator;E-Tanks    Sleep Oxygen Prescription BiPAP;Continuous    Liters per minute 3    Home Exercise Oxygen Prescription Continuous    Liters per minute 4    Home Resting Oxygen Prescription Continuous    Liters per minute 2    Compliance with Home Oxygen Use Yes      Intervention   Short Term Goals To learn and exhibit compliance with exercise, home and travel O2 prescription;To learn and  understand importance of monitoring SPO2 with pulse oximeter and demonstrate accurate use of the pulse oximeter.;To learn and understand importance of maintaining oxygen saturations>88%;To learn and demonstrate proper pursed lip breathing techniques or other breathing techniques.     Long  Term Goals Exhibits compliance with exercise, home  and travel O2 prescription;Verbalizes importance of monitoring SPO2 with pulse oximeter and return demonstration;Maintenance of O2 saturations>88%;Exhibits proper breathing techniques, such as pursed lip breathing or other method taught during program session             Oxygen Re-Evaluation:  Oxygen Re-Evaluation     Row Name 10/18/22 1342 10/18/22 1343 11/05/22 1317 12/03/22 1010 12/31/22 1146     Program Oxygen Prescription   Program Oxygen Prescription -- -- Continuous Continuous Continuous   Liters per minute -- -- 6 6 8   $ Comments -- -- -- -- pt increases his oxygen to 8 liters while exercising     Home Oxygen   Home Oxygen Device -- -- Home Concentrator;E-Tanks Home Concentrator;E-Tanks Home Concentrator;E-Tanks   Sleep Oxygen Prescription -- -- BiPAP;Continuous BiPAP;Continuous BiPAP;Continuous   Liters per minute -- -- 3 3 3   $ Home Exercise Oxygen Prescription -- -- Continuous Continuous Continuous   Liters per minute -- -- 4 4 4   $ Home Resting Oxygen Prescription -- -- Continuous Continuous Continuous   Liters per minute -- -- 2 2 2   $ Compliance with Home Oxygen Use -- -- Yes -- Yes     Goals/Expected Outcomes   Short Term Goals -- -- To learn and exhibit compliance with exercise, home and travel O2 prescription;To learn and understand importance of monitoring SPO2 with pulse oximeter and demonstrate accurate use of the pulse oximeter.;To learn and understand importance of maintaining  oxygen saturations>88%;To learn and demonstrate proper pursed lip breathing techniques or other breathing techniques.  To learn and exhibit compliance with  exercise, home and travel O2 prescription;To learn and understand importance of monitoring SPO2 with pulse oximeter and demonstrate accurate use of the pulse oximeter.;To learn and understand importance of maintaining oxygen saturations>88%;To learn and demonstrate proper pursed lip breathing techniques or other breathing techniques.  To learn and exhibit compliance with exercise, home and travel O2 prescription;To learn and understand importance of monitoring SPO2 with pulse oximeter and demonstrate accurate use of the pulse oximeter.;To learn and understand importance of maintaining oxygen saturations>88%;To learn and demonstrate proper pursed lip breathing techniques or other breathing techniques.    Long  Term Goals -- -- Exhibits compliance with exercise, home  and travel O2 prescription;Verbalizes importance of monitoring SPO2 with pulse oximeter and return demonstration;Maintenance of O2 saturations>88%;Exhibits proper breathing techniques, such as pursed lip breathing or other method taught during program session Exhibits compliance with exercise, home  and travel O2 prescription;Verbalizes importance of monitoring SPO2 with pulse oximeter and return demonstration;Maintenance of O2 saturations>88%;Exhibits proper breathing techniques, such as pursed lip breathing or other method taught during program session Exhibits compliance with exercise, home  and travel O2 prescription;Verbalizes importance of monitoring SPO2 with pulse oximeter and return demonstration;Maintenance of O2 saturations>88%;Exhibits proper breathing techniques, such as pursed lip breathing or other method taught during program session   Goals/Expected Outcomes -- -- Complinace Complinace Complinace            Oxygen Discharge (Final Oxygen Re-Evaluation):  Oxygen Re-Evaluation - 12/31/22 1146       Program Oxygen Prescription   Program Oxygen Prescription Continuous    Liters per minute 8    Comments pt increases his  oxygen to 8 liters while exercising      Home Oxygen   Home Oxygen Device Home Concentrator;E-Tanks    Sleep Oxygen Prescription BiPAP;Continuous    Liters per minute 3    Home Exercise Oxygen Prescription Continuous    Liters per minute 4    Home Resting Oxygen Prescription Continuous    Liters per minute 2    Compliance with Home Oxygen Use Yes      Goals/Expected Outcomes   Short Term Goals To learn and exhibit compliance with exercise, home and travel O2 prescription;To learn and understand importance of monitoring SPO2 with pulse oximeter and demonstrate accurate use of the pulse oximeter.;To learn and understand importance of maintaining oxygen saturations>88%;To learn and demonstrate proper pursed lip breathing techniques or other breathing techniques.     Long  Term Goals Exhibits compliance with exercise, home  and travel O2 prescription;Verbalizes importance of monitoring SPO2 with pulse oximeter and return demonstration;Maintenance of O2 saturations>88%;Exhibits proper breathing techniques, such as pursed lip breathing or other method taught during program session    Goals/Expected Outcomes Complinace             Initial Exercise Prescription:  Initial Exercise Prescription - 10/18/22 1400       Date of Initial Exercise RX and Referring Provider   Date 10/18/22    Referring Provider Dr. Melvyn Novas    Expected Discharge Date 02/25/23      Oxygen   Oxygen Continuous    Liters 6    Maintain Oxygen Saturation 88% or higher      NuStep   Level 1    SPM 60    Minutes 17      Arm Ergometer   Level 1  RPM 40    Minutes 22      Prescription Details   Frequency (times per week) 2    Duration Progress to 30 minutes of continuous aerobic without signs/symptoms of physical distress      Intensity   THRR 40-80% of Max Heartrate 60-121    Ratings of Perceived Exertion 11-13    Perceived Dyspnea 0-4      Resistance Training   Training Prescription Yes    Weight 2     Reps 10-15             Perform Capillary Blood Glucose checks as needed.  Exercise Prescription Changes:   Exercise Prescription Changes     Row Name 11/05/22 1300 11/19/22 1100 11/26/22 1100 11/28/22 1100 12/17/22 1100     Response to Exercise   Blood Pressure (Admit) 122/50 108/68 -- 110/70 108/64   Blood Pressure (Exercise) 126/50 110/60 -- 118/68 118/60   Blood Pressure (Exit) 110/60 104/64 -- 104/64 100/60   Heart Rate (Admit) 86 bpm 80 bpm -- 85 bpm 82 bpm   Heart Rate (Exercise) 95 bpm 105 bpm -- 103 bpm 92 bpm   Heart Rate (Exit) 88 bpm 94 bpm -- 93 bpm 90 bpm   Oxygen Saturation (Admit) 93 % 92 % -- 90 % 96 %   Oxygen Saturation (Exercise) 93 % 91 % -- 90 % 92 %   Oxygen Saturation (Exit) 88 % 95 % -- 90 % 96 %   Rating of Perceived Exertion (Exercise) 13 12 -- 12 12   Perceived Dyspnea (Exercise) 13 12 -- 12 12   Duration Continue with 30 min of aerobic exercise without signs/symptoms of physical distress. Continue with 30 min of aerobic exercise without signs/symptoms of physical distress. -- Continue with 30 min of aerobic exercise without signs/symptoms of physical distress. Continue with 30 min of aerobic exercise without signs/symptoms of physical distress.   Intensity THRR unchanged THRR unchanged -- THRR unchanged THRR unchanged     Progression   Progression Continue to progress workloads to maintain intensity without signs/symptoms of physical distress. Continue to progress workloads to maintain intensity without signs/symptoms of physical distress. -- Continue to progress workloads to maintain intensity without signs/symptoms of physical distress. Continue to progress workloads to maintain intensity without signs/symptoms of physical distress.     Resistance Training   Training Prescription Yes Yes -- Yes Yes   Weight 5 5 -- 5 5   Reps 10-15 10-15 -- 10-15 10-15   Time 10 Minutes 10 Minutes -- 10 Minutes 10 Minutes     Oxygen   Oxygen Continuous Continuous  -- Continuous Continuous   Liters 6 6 -- 6 6     NuStep   Level 2 2 -- 2 3   SPM 93 92 -- 98 94   Minutes 17 17 -- 17 17   METs 2.1 2.3 -- 2.3 2.3     Arm Ergometer   Level 1 2 -- 2.9 2.5   RPM 44 49 -- 49 45   Minutes 22 22 -- 22 22   METs 1.6 2 -- 2.3 2.8     Home Exercise Plan   Plans to continue exercise at -- -- Home (comment) -- --   Frequency -- -- Add 3 additional days to program exercise sessions. -- --   Initial Home Exercises Provided -- -- 11/26/22 -- --    [place] Name 12/31/22 1100  Response to Exercise   Blood Pressure (Admit) 108/60       Blood Pressure (Exercise) 122/68       Blood Pressure (Exit) 106/60       Heart Rate (Admit) 79 bpm       Heart Rate (Exercise) 101 bpm       Heart Rate (Exit) 90 bpm       Oxygen Saturation (Admit) 100 %       Oxygen Saturation (Exercise) 93 %       Oxygen Saturation (Exit) 93 %       Rating of Perceived Exertion (Exercise) 12       Perceived Dyspnea (Exercise) 12       Duration Continue with 30 min of aerobic exercise without signs/symptoms of physical distress.       Intensity THRR unchanged         Progression   Progression Continue to progress workloads to maintain intensity without signs/symptoms of physical distress.         Resistance Training   Training Prescription Yes       Weight 5       Reps 10-15       Time 10 Minutes         Oxygen   Oxygen Continuous       Liters 8         NuStep   Level 3       SPM 97       Minutes 17       METs 2.4         Arm Ergometer   Level 3       RPM 50       Minutes 22       METs 2.4                Exercise Comments:   Exercise Comments     Row Name 11/26/22 1144           Exercise Comments home exercise reviewed                Exercise Goals and Review:   Exercise Goals     Row Name 10/18/22 1416 11/05/22 1315 12/03/22 1007 12/31/22 1143       Exercise Goals   Increase Physical Activity Yes Yes Yes Yes    Intervention  Provide advice, education, support and counseling about physical activity/exercise needs.;Develop an individualized exercise prescription for aerobic and resistive training based on initial evaluation findings, risk stratification, comorbidities and participant's personal goals. Provide advice, education, support and counseling about physical activity/exercise needs.;Develop an individualized exercise prescription for aerobic and resistive training based on initial evaluation findings, risk stratification, comorbidities and participant's personal goals. Provide advice, education, support and counseling about physical activity/exercise needs.;Develop an individualized exercise prescription for aerobic and resistive training based on initial evaluation findings, risk stratification, comorbidities and participant's personal goals. Provide advice, education, support and counseling about physical activity/exercise needs.;Develop an individualized exercise prescription for aerobic and resistive training based on initial evaluation findings, risk stratification, comorbidities and participant's personal goals.    Expected Outcomes Short Term: Attend rehab on a regular basis to increase amount of physical activity.;Long Term: Exercising regularly at least 3-5 days a week.;Long Term: Add in home exercise to make exercise part of routine and to increase amount of physical activity. Short Term: Attend rehab on a regular basis to increase amount of physical activity.;Long Term: Exercising regularly at least 3-5 days a  week.;Long Term: Add in home exercise to make exercise part of routine and to increase amount of physical activity. Short Term: Attend rehab on a regular basis to increase amount of physical activity.;Long Term: Exercising regularly at least 3-5 days a week.;Long Term: Add in home exercise to make exercise part of routine and to increase amount of physical activity. Short Term: Attend rehab on a regular basis to  increase amount of physical activity.;Long Term: Exercising regularly at least 3-5 days a week.;Long Term: Add in home exercise to make exercise part of routine and to increase amount of physical activity.    Increase Strength and Stamina Yes Yes Yes Yes    Intervention Provide advice, education, support and counseling about physical activity/exercise needs.;Develop an individualized exercise prescription for aerobic and resistive training based on initial evaluation findings, risk stratification, comorbidities and participant's personal goals. Provide advice, education, support and counseling about physical activity/exercise needs.;Develop an individualized exercise prescription for aerobic and resistive training based on initial evaluation findings, risk stratification, comorbidities and participant's personal goals. Provide advice, education, support and counseling about physical activity/exercise needs.;Develop an individualized exercise prescription for aerobic and resistive training based on initial evaluation findings, risk stratification, comorbidities and participant's personal goals. Provide advice, education, support and counseling about physical activity/exercise needs.;Develop an individualized exercise prescription for aerobic and resistive training based on initial evaluation findings, risk stratification, comorbidities and participant's personal goals.    Expected Outcomes Short Term: Increase workloads from initial exercise prescription for resistance, speed, and METs.;Short Term: Perform resistance training exercises routinely during rehab and add in resistance training at home;Long Term: Improve cardiorespiratory fitness, muscular endurance and strength as measured by increased METs and functional capacity (6MWT) Short Term: Increase workloads from initial exercise prescription for resistance, speed, and METs.;Short Term: Perform resistance training exercises routinely during rehab and add in  resistance training at home;Long Term: Improve cardiorespiratory fitness, muscular endurance and strength as measured by increased METs and functional capacity (6MWT) Short Term: Increase workloads from initial exercise prescription for resistance, speed, and METs.;Short Term: Perform resistance training exercises routinely during rehab and add in resistance training at home;Long Term: Improve cardiorespiratory fitness, muscular endurance and strength as measured by increased METs and functional capacity (6MWT) Short Term: Increase workloads from initial exercise prescription for resistance, speed, and METs.;Short Term: Perform resistance training exercises routinely during rehab and add in resistance training at home;Long Term: Improve cardiorespiratory fitness, muscular endurance and strength as measured by increased METs and functional capacity (6MWT)    Able to understand and use rate of perceived exertion (RPE) scale Yes Yes Yes Yes    Intervention Provide education and explanation on how to use RPE scale Provide education and explanation on how to use RPE scale Provide education and explanation on how to use RPE scale Provide education and explanation on how to use RPE scale    Expected Outcomes Short Term: Able to use RPE daily in rehab to express subjective intensity level;Long Term:  Able to use RPE to guide intensity level when exercising independently Short Term: Able to use RPE daily in rehab to express subjective intensity level;Long Term:  Able to use RPE to guide intensity level when exercising independently Short Term: Able to use RPE daily in rehab to express subjective intensity level;Long Term:  Able to use RPE to guide intensity level when exercising independently Short Term: Able to use RPE daily in rehab to express subjective intensity level;Long Term:  Able to use RPE to guide intensity level when  exercising independently    Able to understand and use Dyspnea scale Yes Yes Yes Yes     Intervention Provide education and explanation on how to use Dyspnea scale Provide education and explanation on how to use Dyspnea scale Provide education and explanation on how to use Dyspnea scale Provide education and explanation on how to use Dyspnea scale    Expected Outcomes Short Term: Able to use Dyspnea scale daily in rehab to express subjective sense of shortness of breath during exertion;Long Term: Able to use Dyspnea scale to guide intensity level when exercising independently Short Term: Able to use Dyspnea scale daily in rehab to express subjective sense of shortness of breath during exertion;Long Term: Able to use Dyspnea scale to guide intensity level when exercising independently Short Term: Able to use Dyspnea scale daily in rehab to express subjective sense of shortness of breath during exertion;Long Term: Able to use Dyspnea scale to guide intensity level when exercising independently Short Term: Able to use Dyspnea scale daily in rehab to express subjective sense of shortness of breath during exertion;Long Term: Able to use Dyspnea scale to guide intensity level when exercising independently    Knowledge and understanding of Target Heart Rate Range (THRR) Yes Yes Yes Yes    Intervention Provide education and explanation of THRR including how the numbers were predicted and where they are located for reference Provide education and explanation of THRR including how the numbers were predicted and where they are located for reference Provide education and explanation of THRR including how the numbers were predicted and where they are located for reference Provide education and explanation of THRR including how the numbers were predicted and where they are located for reference    Expected Outcomes Long Term: Able to use THRR to govern intensity when exercising independently;Short Term: Able to use daily as guideline for intensity in rehab;Short Term: Able to state/look up THRR Long Term: Able to  use THRR to govern intensity when exercising independently;Short Term: Able to use daily as guideline for intensity in rehab;Short Term: Able to state/look up THRR Long Term: Able to use THRR to govern intensity when exercising independently;Short Term: Able to use daily as guideline for intensity in rehab;Short Term: Able to state/look up THRR Long Term: Able to use THRR to govern intensity when exercising independently;Short Term: Able to use daily as guideline for intensity in rehab;Short Term: Able to state/look up THRR    Understanding of Exercise Prescription Yes Yes Yes Yes    Intervention Provide education, explanation, and written materials on patient's individual exercise prescription Provide education, explanation, and written materials on patient's individual exercise prescription Provide education, explanation, and written materials on patient's individual exercise prescription Provide education, explanation, and written materials on patient's individual exercise prescription    Expected Outcomes Short Term: Able to explain program exercise prescription;Long Term: Able to explain home exercise prescription to exercise independently Short Term: Able to explain program exercise prescription;Long Term: Able to explain home exercise prescription to exercise independently Short Term: Able to explain program exercise prescription;Long Term: Able to explain home exercise prescription to exercise independently Short Term: Able to explain program exercise prescription;Long Term: Able to explain home exercise prescription to exercise independently             Exercise Goals Re-Evaluation :  Exercise Goals Re-Evaluation     [place]ome[place] Name 11/05/22 1315 12/03/22 1008 12/31/22 1143         Exercise Goal Re-Evaluation   Exercise Goals Review  Increase Strength and Stamina;Increase Physical Activity;Able to understand and use rate of perceived exertion (RPE) scale;Able to understand and use Dyspnea  scale;Knowledge and understanding of Target Heart Rate Range (THRR);Understanding of Exercise Prescription Increase Physical Activity;Increase Strength and Stamina;Able to understand and use Dyspnea scale;Able to understand and use rate of perceived exertion (RPE) scale;Knowledge and understanding of Target Heart Rate Range (THRR);Understanding of Exercise Prescription Increase Physical Activity;Increase Strength and Stamina;Able to understand and use Dyspnea scale;Able to understand and use rate of perceived exertion (RPE) scale;Knowledge and understanding of Target Heart Rate Range (THRR);Understanding of Exercise Prescription     Comments Pt has completed 3 sessions of PR. He is progressing his level on the stepper after 3 classes. He is motivated during class and enjoys coming. He is currently exercising at 2.1 METs on the stepper. Will continue to monitor and progress as able. Pt has completed 10 sessions of PR. He continues to progress during each session increasing his level. He is motivated and enjoys coming to class. He is currently exercising at home on his own by walking and using his own equipment/weights. He is currently exercising at 2.3 METs on the stepper. Will continue to monitor and progress as able. Pt has completed 18 sessions of PR. He continues to progress during the sessions by increasing his SPM and RPM on each equipment. He enjoys coming to class. He is currently exercising at 2.4 METs on the stepper. He continues to exercise at home on his off days. Will continue to monitor and progress as able.     Expected Outcomes Through exercise at home and at rehab, the patient will meet their stated goals. Through exercise at home and at rehab, the patient will meet their stated goals. Through exercise at home and at rehab, the patient will meet their stated goals.              Discharge Exercise Prescription (Final Exercise Prescription Changes):  Exercise Prescription Changes - 12/31/22  1100       Response to Exercise   Blood Pressure (Admit) 108/60    Blood Pressure (Exercise) 122/68    Blood Pressure (Exit) 106/60    Heart Rate (Admit) 79 bpm    Heart Rate (Exercise) 101 bpm    Heart Rate (Exit) 90 bpm    Oxygen Saturation (Admit) 100 %    Oxygen Saturation (Exercise) 93 %    Oxygen Saturation (Exit) 93 %    Rating of Perceived Exertion (Exercise) 12    Perceived Dyspnea (Exercise) 12    Duration Continue with 30 min of aerobic exercise without signs/symptoms of physical distress.    Intensity THRR unchanged      Progression   Progression Continue to progress workloads to maintain intensity without signs/symptoms of physical distress.      Resistance Training   Training Prescription Yes    Weight 5    Reps 10-15    Time 10 Minutes      Oxygen   Oxygen Continuous    Liters 8      NuStep   Level 3    SPM 97    Minutes 17    METs 2.4      Arm Ergometer   Level 3    RPM 50    Minutes 22    METs 2.4             Nutrition:  Target Goals: Understanding of nutrition guidelines, daily intake of sodium '1500mg'$ , cholesterol '200mg'$ , calories 30% from  fat and 7% or less from saturated fats, daily to have 5 or more servings of fruits and vegetables.  Biometrics:  Pre Biometrics - 10/18/22 1416       Pre Biometrics   Height '5\' 10"'$  (1.778 m)    Weight 80.6 kg    Waist Circumference 38 inches    Hip Circumference 37 inches    Waist to Hip Ratio 1.03 %    BMI (Calculated) 25.5    Triceps Skinfold 10 mm    Grip Strength 23.4 kg    Flexibility 0 in    Single Leg Stand 0 seconds              Nutrition Therapy Plan and Nutrition Goals:  Nutrition Therapy & Goals - 10/18/22 1335       Personal Nutrition Goals   Comments Patient scored 88 on his diet assessment. Handout provided and explained to he and his wife regarding healthy nutrition. We offer 2 educational sessions on heart healthy nutrition with handouts and assistance with RD  referral if patient is interested.      Intervention Plan   Intervention Nutrition handout(s) given to patient.    Expected Outcomes Short Term Goal: Understand basic principles of dietary content, such as calories, fat, sodium, cholesterol and nutrients.             Nutrition Assessments:  Nutrition Assessments - 10/18/22 1335       MEDFICTS Scores   Pre Score 88            MEDIFICTS Score Key: ?70 Need to make dietary changes  40-70 Heart Healthy Diet ? 40 Therapeutic Level Cholesterol Diet   Picture Your Plate Scores: <03 Unhealthy dietary pattern with much room for improvement. 41-50 Dietary pattern unlikely to meet recommendations for good health and room for improvement. 51-60 More healthful dietary pattern, with some room for improvement.  >60 Healthy dietary pattern, although there may be some specific behaviors that could be improved.    Nutrition Goals Re-Evaluation:   Nutrition Goals Discharge (Final Nutrition Goals Re-Evaluation):   Psychosocial: Target Goals: Acknowledge presence or absence of significant depression and/or stress, maximize coping skills, provide positive support system. Participant is able to verbalize types and ability to use techniques and skills needed for reducing stress and depression.  Initial Review & Psychosocial Screening:  Initial Psych Review & Screening - 10/18/22 1345       Initial Review   Current issues with Current Depression;Current Anxiety/Panic;Current Sleep Concerns      Family Dynamics   Good Support System? Yes      Barriers   Psychosocial barriers to participate in program There are no identifiable barriers or psychosocial needs.      Screening Interventions   Interventions Encouraged to exercise;Provide feedback about the scores to participant    Expected Outcomes Short Term goal: Identification and review with participant of any Quality of Life or Depression concerns found by scoring the questionnaire.              Quality of Life Scores:  Quality of Life - 10/18/22 1417       Quality of Life   Select Quality of Life      Quality of Life Scores   Health/Function Pre 16.8 %    Socioeconomic Pre 29.14 %    Psych/Spiritual Pre 18 %    Family Pre 30 %    GLOBAL Pre 21.53 %  Scores of 19 and below usually indicate a poorer quality of life in these areas.  A difference of  2-3 points is a clinically meaningful difference.  A difference of 2-3 points in the total score of the Quality of Life Index has been associated with significant improvement in overall quality of life, self-image, physical symptoms, and general health in studies assessing change in quality of life.   PHQ-9: Review Flowsheet       10/18/2022  Depression screen PHQ 2/9  Decreased Interest 1  Down, Depressed, Hopeless 1  PHQ - 2 Score 2  Altered sleeping 2  Tired, decreased energy 1  Change in appetite 0  Feeling bad or failure about yourself  1  Trouble concentrating 0  Moving slowly or fidgety/restless 0  Suicidal thoughts 0  PHQ-9 Score 6  Difficult doing work/chores Somewhat difficult   Interpretation of Total Score  Total Score Depression Severity:  1-4 = Minimal depression, 5-9 = Mild depression, 10-14 = Moderate depression, 15-19 = Moderately severe depression, 20-27 = Severe depression   Psychosocial Evaluation and Intervention:  Psychosocial Evaluation - 10/18/22 1348       Psychosocial Evaluation & Interventions   Interventions Stress management education;Relaxation education;Encouraged to exercise with the program and follow exercise prescription    Comments Patient has no psychosocial barriers identified at his orientation visit. His initial PHQ-9 score was 6. Patient contracted COVID 19 in August of 2021. 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 He also exercised routinely. He was hospitalized for at least a  month and was discharged to a SNF proir to going home with continuous O2. He has had depression since COVID and is currenlty taking Wellbutrin XL 150 mg and Sertraline 100 mg. His wife says his depression has improved a lot and they feel it is managed well with the medication. He has Alprazelam 0.25 mg for anxiety which he does not take a lot of now. He did have a lot of anxiety and fear of getting SOB. He wears a pulse oximetry constantly. His wife says she believes he does this so he will not get so SOB that he panics. He reports difficulty sleeping for the past month. He has not tried any sleep aide. He is currently exercising by walking short distances at least one time daily. He is ready to start the program hoping to be able to do more activities around the house and exercise more.    Expected Outcomes Patient will continue to have no psychosocial barriers identified and his depression will continue to be managed.    Continue Psychosocial Services  No Follow up required             Psychosocial Re-Evaluation:  Psychosocial Re-Evaluation     [place] Name 10/29/22 1108 11/22/22 0738 12/25/22 1307         Psychosocial Re-Evaluation   Current issues with -- -- Current Depression;Current Anxiety/Panic;Current Sleep Concerns     Comments Pt starting program on 10/29/2022.  Will monitor and help patient reach his goals and improve quality of life. Patrick Jupiter has very supportive family.  Tolerating program well.  Maintaining oxygen at 2-6liters with Sats 91-97%.  Pt is on 3 liters during warm-up and on 4-6 liters during execising.  Attendance is consistent.  Seems to enjoy coming to the program and demonstrates an interest in improving his health.  Patient has no current psycosocial issues. Patient has completed 16 sessions.  He continue to have no psychossocial barriers identified. He is doing well in the program. His depression and anxiety continue to be managed with Wellbutrin and Zoloft. Patient continues  to enjoy the sessions and demonstrates an interest in improving his health. We will continue to monitor his progress.     Expected Outcomes Will complete program, meeting both personal and program goals. Will complete program, meeting both personal and program goals. Patient will continue to have no psychosocial barriers identified.     Interventions Stress management education;Encouraged to attend Cardiac Rehabilitation for the exercise;Encouraged to attend Pulmonary Rehabilitation for the exercise;Relaxation education Stress management education;Encouraged to attend Cardiac Rehabilitation for the exercise;Encouraged to attend Pulmonary Rehabilitation for the exercise;Relaxation education Stress management education;Encouraged to attend Pulmonary Rehabilitation for the exercise;Relaxation education     Continue Psychosocial Services  No Follow up required No Follow up required No Follow up required       Initial Review   Source of Stress Concerns Chronic Illness Chronic Illness  Pt is on continuous home oxygen. --              Psychosocial Discharge (Final Psychosocial Re-Evaluation):  Psychosocial Re-Evaluation - 12/25/22 1307       Psychosocial Re-Evaluation   Current issues with Current Depression;Current Anxiety/Panic;Current Sleep Concerns    Comments Patient has completed 16 sessions. He continue to have no psychossocial barriers identified. He is doing well in the program. His depression and anxiety continue to be managed with Wellbutrin and Zoloft. Patient continues to enjoy the sessions and demonstrates an interest in improving his health. We will continue to monitor his progress.    Expected Outcomes Patient will continue to have no psychosocial barriers identified.    Interventions Stress management education;Encouraged to attend Pulmonary Rehabilitation for the exercise;Relaxation education    Continue Psychosocial Services  No Follow up required               Education: Education Goals: Education classes will be provided on a weekly basis, covering required topics. Participant will state understanding/return demonstration of topics presented.  Learning Barriers/Preferences:  Learning Barriers/Preferences - 10/18/22 1336       Learning Barriers/Preferences   Learning Barriers None    Learning Preferences Audio             Education Topics: How Lungs Work and Diseases: - Discuss the anatomy of the lungs and diseases that can affect the lungs, such as COPD. Flowsheet Row PULMONARY REHAB OTHER RESPIRATORY from 12/26/2022 in [place]  Date 12/05/22  Educator HB  Instruction Review Code 1- Verbalizes Understanding       Exercise: -Discuss the importance of exercise, FITT principles of exercise, normal and abnormal responses to exercise, and how to exercise safely.   Environmental Irritants: -Discuss types of environmental irritants and how to limit exposure to environmental irritants. Flowsheet Row PULMONARY REHAB OTHER RESPIRATORY from 12/26/2022 in [place]  Date 12/12/22  Educator HB  Instruction Review Code 1- Verbalizes Understanding       Meds/Inhalers and oxygen: - Discuss respiratory medications, definition of an inhaler and oxygen, and the proper way to use an inhaler and oxygen.   Energy Saving Techniques: - Discuss methods to conserve energy and decrease shortness of breath when performing activities of daily living.  Flowsheet Row PULMONARY REHAB OTHER RESPIRATORY from 12/26/2022 in [place]  Date 12/26/22  Educator HB  Instruction Review Code 1- Verbalizes Understanding  Bronchial Hygiene / Breathing Techniques: - Discuss breathing mechanics, pursed-lip breathing technique,  proper posture, effective ways to clear airways, and other functional breathing techniques   Cleaning Equipment: - Provides group verbal and written  instruction about the health risks of elevated stress, cause of high stress, and healthy ways to reduce stress.   Nutrition I: Fats: - Discuss the types of cholesterol, what cholesterol does to the body, and how cholesterol levels can be controlled.   Nutrition II: Labels: -Discuss the different components of food labels and how to read food labels. Flowsheet Row PULMONARY REHAB OTHER RESPIRATORY from 12/26/2022 in [place]  Date 10/31/22  Educator HB  Instruction Review Code 1- Verbalizes Understanding       Respiratory Infections: - Discuss the signs and symptoms of respiratory infections, ways to prevent respiratory infections, and the importance of seeking medical treatment when having a respiratory infection.   Stress I: Signs and Symptoms: - Discuss the causes of stress, how stress may lead to anxiety and depression, and ways to limit stress. Flowsheet Row PULMONARY REHAB OTHER RESPIRATORY from 12/26/2022 in [place]  Date 11/14/22  Educator DF  Instruction Review Code 2- Demonstrated Understanding       Stress II: Relaxation: -Discuss relaxation techniques to limit stress. Flowsheet Row PULMONARY REHAB OTHER RESPIRATORY from 12/26/2022 in [place]  Date 11/21/22  Educator HB  Instruction Review Code 1- Verbalizes Understanding       Oxygen for Home/Travel: - Discuss how to prepare for travel when on oxygen and proper ways to transport and store oxygen to ensure safety. Flowsheet Row PULMONARY REHAB OTHER RESPIRATORY from 12/26/2022 in [place]  Date 11/28/22  Educator DF  Instruction Review Code 2- Demonstrated Understanding       Knowledge Questionnaire Score:  Knowledge Questionnaire Score - 10/18/22 1336       Knowledge Questionnaire Score   Pre Score 14/18             Core Components/Risk Factors/Patient Goals at Admission:  Personal Goals and  Risk Factors at Admission - 10/18/22 1336       Core Components/Risk Factors/Patient Goals on Admission    Weight Management Weight Maintenance    Improve shortness of breath with ADL's Yes    Intervention Provide education, individualized exercise plan and daily activity instruction to help decrease symptoms of SOB with activities of daily living.    Expected Outcomes Short Term: Improve cardiorespiratory fitness to achieve a reduction of symptoms when performing ADLs;Long Term: Be able to perform more ADLs without symptoms or delay the onset of symptoms    Diabetes Yes    Intervention Provide education about signs/symptoms and action to take for hypo/hyperglycemia.;Provide education about proper nutrition, including hydration, and aerobic/resistive exercise prescription along with prescribed medications to achieve blood glucose in normal ranges: Fasting glucose 65-99 mg/dL    Expected Outcomes Short Term: Participant verbalizes understanding of the signs/symptoms and immediate care of hyper/hypoglycemia, proper foot care and importance of medication, aerobic/resistive exercise and nutrition plan for blood glucose control.    Personal Goal Other Yes    Personal Goal Patient wants to be able to do normal tasks around the house and be able to exercise. He wants to improve his SOB.    Intervention Patient will attend PR 2 days/week with exercise and education.    Expected Outcomes Patient will complete the program meeting both personal and program goals.  Core Components/Risk Factors/Patient Goals Review:   Goals and Risk Factor Review     Row Name 10/29/22 1111 11/22/22 0750 12/25/22 1318         Core Components/Risk Factors/Patient Goals Review   Personal Goals Review Develop more efficient breathing techniques such as purse lipped breathing and diaphragmatic breathing and practicing self-pacing with activity.;Increase knowledge of respiratory medications and ability to use  respiratory devices properly.;Diabetes;Improve shortness of breath with ADL's Develop more efficient breathing techniques such as purse lipped breathing and diaphragmatic breathing and practicing self-pacing with activity.;Increase knowledge of respiratory medications and ability to use respiratory devices properly.;Diabetes;Improve shortness of breath with ADL's Weight Management/Obesity;Improve shortness of breath with ADL's;Diabetes;Other;Develop more efficient breathing techniques such as purse lipped breathing and diaphragmatic breathing and practicing self-pacing with activity.     Review To begin program 10/29/2022.  Pt is on Oxygen at 1-2 liters at rest and 5-6 liters with exercise at home.  Has suffered with long COVID since 07/2020, requiring oxygen. To begin program 10/29/2022.  Has suffered with long COVID since 07/2020, requiring oxygen.  Tolerating PR using Nu-Step and AE.  Vital signs are stable. Patient has completed 16 sessions. His current weight is 82.3 kg up 1.1 kg from last 30 day review. He is doing well in the program with consistent attendance and progressions. His blood pressure is at goal. He is exercising on 6 to 8 L of O2 via [place] with O2 Sats at 91 to 95%. His personal goals for the program are to be able to do normal activities around the house; exercise; and improve SOB. We will continue to monitor his progress as he works toward meeting these goals.     Expected Outcomes Plan of care is ongoing.  No further concerns at this time. Plan of care is ongoing.  No further concerns at this time. Patient will complete the program and meet both personal and program goals.              Core Components/Risk Factors/Patient Goals at Discharge (Final Review):   Goals and Risk Factor Review - 12/25/22 1318       Core Components/Risk Factors/Patient Goals Review   Personal Goals Review Weight Management/Obesity;Improve shortness of breath with ADL's;Diabetes;Other;Develop more efficient  breathing techniques such as purse lipped breathing and diaphragmatic breathing and practicing self-pacing with activity.    Review Patient has completed 16 sessions. His current weight is 82.3 kg up 1.1 kg from last 30 day review. He is doing well in the program with consistent attendance and progressions. His blood pressure is at goal. He is exercising on 6 to 8 L of O2 via [place] with O2 Sats at 91 to 95%. His personal goals for the program are to be able to do normal activities around the house; exercise; and improve SOB. We will continue to monitor his progress as he works toward meeting these goals.    Expected Outcomes Patient will complete the program and meet both personal and program goals.             ITP Comments:   Comments: ITP REVIEW Pt is making expected progress toward pulmonary rehab goals after completing 19 sessions. Recommend continued exercise, life style modification, education, and utilization of breathing techniques to increase stamina and strength and decrease shortness of breath with exertion.

## 2023-01-02 ENCOUNTER — Encounter (HOSPITAL_COMMUNITY)
Admission: RE | Admit: 2023-01-02 | Discharge: 2023-01-02 | Disposition: A | Discharge status: Home / Self Care | Payer: PPO | Source: Ambulatory Visit | Attending: Internal Medicine | Admitting: Internal Medicine

## 2023-01-02 DIAGNOSIS — J9611 Chronic respiratory failure with hypoxia: Secondary | ICD-10-CM | POA: Diagnosis not present

## 2023-01-02 DIAGNOSIS — J9612 Chronic respiratory failure with hypercapnia: Secondary | ICD-10-CM | POA: Diagnosis not present

## 2023-01-02 NOTE — Progress Notes (Signed)
Daily Session Note  Patient Details  Name: Tim Day MRN: [id number] Date of Birth: 09/21/1951 Referring Provider:   Flowsheet Row PULMONARY REHAB OTHER RESP ORIENTATION from 10/18/2022 in [place]  Referring Provider Dr. Melvyn Novas       Encounter Date: 01/02/2023  Check In:  Session Check In - 01/02/23 1042       Check-In   Supervising physician immediately available to respond to emergencies [facility] MD immediately available    Physician(s) Dr Dellia Cloud    Location [facility]    Staff Present Leana Roe, BS, Exercise Physiologist;Cary Wilford BSN, RN    Virtual Visit No    Medication changes reported     No    Fall or balance concerns reported    Yes    Comments Patient says he loses his balance frequently.    Tobacco Cessation No Change    Warm-up and Cool-down Performed as group-led instruction    Resistance Training Performed Yes    VAD Patient? No    PAD/SET Patient? No      Pain Assessment   Currently in Pain? No/denies    Pain Score 0-No pain    Multiple Pain Sites No             Capillary Blood Glucose: No results found for this or any previous visit (from the past 24 hour(s)).    Social History   Tobacco Use  Smoking Status Never  Smokeless Tobacco Never    Goals Met:  Proper associated with RPD/PD & O2 Sat Independence with exercise equipment Using PLB without cueing & demonstrates good technique Exercise tolerated well Queuing for purse lip breathing No report of concerns or symptoms today Strength training completed today  Goals Unmet:  Not Applicable  Comments: check out at 11:45   Dr. Kathie Dike is Medical Director for [facility].

## 2023-01-07 ENCOUNTER — Encounter (HOSPITAL_COMMUNITY)
Admission: RE | Admit: 2023-01-07 | Discharge: 2023-01-07 | Disposition: A | Discharge status: Home / Self Care | Payer: PPO | Source: Ambulatory Visit | Attending: Internal Medicine | Admitting: Internal Medicine

## 2023-01-07 DIAGNOSIS — J9611 Chronic respiratory failure with hypoxia: Secondary | ICD-10-CM

## 2023-01-07 NOTE — Progress Notes (Signed)
Daily Session Note  Patient Details  Name: Tim Day MRN: [id number] Date of Birth: [birth year] Referring Provider:   [place] from 10/18/2022 in [place]  Referring Provider Dr. Melvyn Novas       Encounter Date: 01/07/2023  Check In:  Session Check In - 01/07/23 1044       Check-In   Supervising physician immediately available to respond to emergencies [facility] immediately available    Physician(s) Dr Harl Bowie    Location [facility]    Staff Present Leana Roe, BS, Exercise Physiologist;Dempsey Knotek Hassell Done, RN, BSN;Dalton Sherrie George, MS, ACSM-CEP    Virtual Visit No    Medication changes reported     No    Fall or balance concerns reported    Yes    Comments Patient says he loses his balance frequently.    Tobacco Cessation No Change    Warm-up and Cool-down Performed as group-led instruction    Resistance Training Performed Yes    VAD Patient? No    PAD/SET Patient? No      Pain Assessment   Currently in Pain? No/denies    Pain Score 0-No pain    Multiple Pain Sites No             Capillary Blood Glucose: No results found for this or any previous visit (from the past 24 hour(s)).    Social History   Tobacco Use  Smoking Status Never  Smokeless Tobacco Never    Goals Met:  Proper associated with RPD/PD & O2 Sat Independence with exercise equipment Using PLB without cueing & demonstrates good technique Exercise tolerated well Queuing for purse lip breathing No report of concerns or symptoms today Strength training completed today  Goals Unmet:  Not Applicable  Comments: Checkout at 1145.   Dr. Kathie Dike is Medical Director for [facility].

## 2023-01-09 ENCOUNTER — Encounter (HOSPITAL_COMMUNITY)
Admission: RE | Admit: 2023-01-09 | Discharge: 2023-01-09 | Disposition: A | Discharge status: Home / Self Care | Payer: PPO | Source: Ambulatory Visit | Attending: Internal Medicine | Admitting: Internal Medicine

## 2023-01-09 DIAGNOSIS — J9611 Chronic respiratory failure with hypoxia: Secondary | ICD-10-CM

## 2023-01-09 NOTE — Progress Notes (Signed)
Daily Session Note  Patient Details  Name: Tim Day MRN: [id number] Date of Birth: 1951/04/21 Referring Provider:   Flowsheet Row PULMONARY REHAB OTHER RESP ORIENTATION from 10/18/2022 in [place]  Referring Provider Dr. Melvyn Novas       Encounter Date: 01/09/2023  Check In:  Session Check In - 01/09/23 1042       Check-In   Supervising physician immediately available to respond to emergencies [facility] immediately available    Physician(s) Dr Harl Bowie    Location [facility]    Staff Present Leana Roe, BS, Exercise Physiologist;Zakry Caso Hassell Done, RN, BSN;Hillary Troutman BSN, RN    Virtual Visit No    Medication changes reported     No    Fall or balance concerns reported    Yes    Comments Patient says he loses his balance frequently.    Tobacco Cessation No Change    Warm-up and Cool-down Performed as group-led instruction    Resistance Training Performed Yes    VAD Patient? No    PAD/SET Patient? No      Pain Assessment   Currently in Pain? No/denies    Pain Score 0-No pain    Multiple Pain Sites No             Capillary Blood Glucose: No results found for this or any previous visit (from the past 24 hour(s)).    Social History   Tobacco Use  Smoking Status Never  Smokeless Tobacco Never    Goals Met:  Proper associated with RPD/PD & O2 Sat Independence with exercise equipment Using PLB without cueing & demonstrates good technique Exercise tolerated well Queuing for purse lip breathing No report of concerns or symptoms today Strength training completed today  Goals Unmet:  Not Applicable  Comments: Checkout at 1145.   Dr. Kathie Dike is Medical Director for [facility].

## 2023-01-11 DIAGNOSIS — J8410 Pulmonary fibrosis, unspecified: Secondary | ICD-10-CM | POA: Diagnosis not present

## 2023-01-11 DIAGNOSIS — I5040 Unspecified combined systolic (congestive) and diastolic (congestive) heart failure: Secondary | ICD-10-CM | POA: Diagnosis not present

## 2023-01-11 DIAGNOSIS — J9611 Chronic respiratory failure with hypoxia: Secondary | ICD-10-CM | POA: Diagnosis not present

## 2023-01-14 ENCOUNTER — Encounter (HOSPITAL_COMMUNITY)
Admission: RE | Admit: 2023-01-14 | Discharge: 2023-01-14 | Disposition: A | Discharge status: Home / Self Care | Payer: PPO | Source: Ambulatory Visit | Attending: Internal Medicine | Admitting: Internal Medicine

## 2023-01-14 VITALS — Wt 179.7 lb

## 2023-01-14 DIAGNOSIS — J9611 Chronic respiratory failure with hypoxia: Secondary | ICD-10-CM

## 2023-01-14 NOTE — Progress Notes (Signed)
Daily Session Note  Patient Details  Name: Tim Day MRN: [id number] Date of Birth: 1951-11-17 Referring Provider:   Flowsheet Row PULMONARY REHAB OTHER RESP ORIENTATION from 10/18/2022 in [place]  Referring Provider Dr. Melvyn Novas       Encounter Date: 01/14/2023  Check In:  Session Check In - 01/14/23 1043       Check-In   Supervising physician immediately available to respond to emergencies [facility] immediately available    Physician(s) Dr Domenic Polite    Location [facility]    Staff Present Leana Roe, BS, Exercise Physiologist;Elham Fini Hassell Done, RN, BSN    Virtual Visit No    Medication changes reported     No    Fall or balance concerns reported    Yes    Comments Patient says he loses his balance frequently.    Tobacco Cessation No Change    Warm-up and Cool-down Performed as group-led instruction    Resistance Training Performed Yes    VAD Patient? No    PAD/SET Patient? No      Pain Assessment   Currently in Pain? No/denies    Pain Score 0-No pain    Multiple Pain Sites No             Capillary Blood Glucose: No results found for this or any previous visit (from the past 24 hour(s)).    Social History   Tobacco Use  Smoking Status Never  Smokeless Tobacco Never    Goals Met:  Proper associated with RPD/PD & O2 Sat Independence with exercise equipment Using PLB without cueing & demonstrates good technique Exercise tolerated well Queuing for purse lip breathing No report of concerns or symptoms today Strength training completed today  Goals Unmet:  Not Applicable  Comments: Check out at 1145.   Dr. Kathie Dike is Medical Director for [facility].

## 2023-01-16 ENCOUNTER — Encounter (HOSPITAL_COMMUNITY)
Admission: RE | Admit: 2023-01-16 | Discharge: 2023-01-16 | Disposition: A | Discharge status: Home / Self Care | Payer: PPO | Source: Ambulatory Visit | Attending: Internal Medicine | Admitting: Internal Medicine

## 2023-01-16 DIAGNOSIS — J9611 Chronic respiratory failure with hypoxia: Secondary | ICD-10-CM

## 2023-01-16 NOTE — Progress Notes (Signed)
Daily Session Note  Patient Details  Name: Tim Day MRN: [id number] Date of Birth: 1951-09-29 Referring Provider:   Flowsheet Row PULMONARY REHAB OTHER RESP ORIENTATION from 10/18/2022 in [place]  Referring Provider Dr. Melvyn Novas       Encounter Date: 01/16/2023  Check In:  Session Check In - 01/16/23 1040       Check-In   Supervising physician immediately available to respond to emergencies [facility] immediately available    Physician(s) Dr Domenic Polite    Location [facility]    Staff Present Hoy Register MHA, MS, ACSM-CEP;Donnie Panik BSN, RN;Daphyne Hassell Done, RN, BSN    Virtual Visit No    Medication changes reported     No    Fall or balance concerns reported    Yes    Comments Patient says he loses his balance frequently.    Tobacco Cessation No Change    Warm-up and Cool-down Performed as group-led instruction    Resistance Training Performed Yes    VAD Patient? No    PAD/SET Patient? No      Pain Assessment   Currently in Pain? No/denies    Pain Score 0-No pain    Multiple Pain Sites No             Capillary Blood Glucose: No results found for this or any previous visit (from the past 24 hour(s)).    Social History   Tobacco Use  Smoking Status Never  Smokeless Tobacco Never    Goals Met:  Proper associated with RPD/PD & O2 Sat Independence with exercise equipment Using PLB without cueing & demonstrates good technique Exercise tolerated well Queuing for purse lip breathing No report of concerns or symptoms today Strength training completed today  Goals Unmet:  Not Applicable  Comments: check out at 11:45   Dr. Kathie Dike is Medical Director for [facility].

## 2023-01-21 ENCOUNTER — Encounter (HOSPITAL_COMMUNITY)
Admission: RE | Admit: 2023-01-21 | Discharge: 2023-01-21 | Disposition: A | Discharge status: Home / Self Care | Payer: PPO | Source: Ambulatory Visit | Attending: Internal Medicine | Admitting: Internal Medicine

## 2023-01-21 DIAGNOSIS — J9611 Chronic respiratory failure with hypoxia: Secondary | ICD-10-CM

## 2023-01-21 NOTE — Progress Notes (Signed)
Daily Session Note  Patient Details  Name: Tim Day MRN: [id number] Date of Birth: December 27, 1950 Referring Provider:   Flowsheet Row PULMONARY REHAB OTHER RESP ORIENTATION from 10/18/2022 in [place]  Referring Provider Dr. Melvyn Novas       Encounter Date: 01/21/2023  Check In:  Session Check In - 01/21/23 1045       Check-In   Supervising physician immediately available to respond to emergencies [facility] immediately available    Physician(s) Dr. Dellia Cloud    Location [facility]    Staff Present Hoy Register MHA, MS, ACSM-CEP;Leana Roe, BS, Exercise Physiologist    Virtual Visit No    Medication changes reported     No    Fall or balance concerns reported    Yes    Comments Patient says he loses his balance frequently.    Tobacco Cessation No Change    Warm-up and Cool-down Performed as group-led instruction    Resistance Training Performed Yes    VAD Patient? No    PAD/SET Patient? No      Pain Assessment   Currently in Pain? No/denies    Pain Score 0-No pain    Multiple Pain Sites No             Capillary Blood Glucose: No results found for this or any previous visit (from the past 24 hour(s)).    Social History   Tobacco Use  Smoking Status Never  Smokeless Tobacco Never    Goals Met:  Proper associated with RPD/PD & O2 Sat Independence with exercise equipment Using PLB without cueing & demonstrates good technique Exercise tolerated well Queuing for purse lip breathing No report of concerns or symptoms today Strength training completed today  Goals Unmet:  Not Applicable  Comments: checkout time is 1145   Dr. Kathie Dike is Medical Director for [facility].

## 2023-01-23 ENCOUNTER — Encounter (HOSPITAL_COMMUNITY): Payer: PPO

## 2023-01-27 DIAGNOSIS — J8410 Pulmonary fibrosis, unspecified: Secondary | ICD-10-CM | POA: Diagnosis not present

## 2023-01-27 DIAGNOSIS — I5040 Unspecified combined systolic (congestive) and diastolic (congestive) heart failure: Secondary | ICD-10-CM | POA: Diagnosis not present

## 2023-01-27 DIAGNOSIS — J9611 Chronic respiratory failure with hypoxia: Secondary | ICD-10-CM | POA: Diagnosis not present

## 2023-01-28 ENCOUNTER — Encounter (HOSPITAL_COMMUNITY)
Admission: RE | Admit: 2023-01-28 | Discharge: 2023-01-28 | Disposition: A | Discharge status: Home / Self Care | Payer: PPO | Source: Ambulatory Visit | Attending: Internal Medicine | Admitting: Internal Medicine

## 2023-01-28 VITALS — Wt 178.1 lb

## 2023-01-28 DIAGNOSIS — J9611 Chronic respiratory failure with hypoxia: Secondary | ICD-10-CM

## 2023-01-28 NOTE — Progress Notes (Signed)
Daily Session Note  Patient Details  Name: Tim Day MRN: [id number] Date of Birth: Apr 05, 1951 Referring Provider:   Flowsheet Row PULMONARY REHAB OTHER RESP ORIENTATION from 10/18/2022 in [place]  Referring Provider Dr. Melvyn Novas       Encounter Date: 01/28/2023  Check In:  Session Check In - 01/28/23 1043       Check-In   Supervising physician immediately available to respond to emergencies [facility] MD immediately available    Physician(s) Dr Harl Bowie    Location [facility]    Staff Present Hoy Register MHA, MS, ACSM-CEP;Leana Roe, BS, Exercise Physiologist;Jayda White Hassell Done, RN, BSN    Virtual Visit No    Medication changes reported     No    Fall or balance concerns reported    Yes    Comments Patient says he loses his balance frequently.    Tobacco Cessation No Change    Warm-up and Cool-down Performed as group-led instruction    Resistance Training Performed Yes    VAD Patient? No    PAD/SET Patient? No      Pain Assessment   Currently in Pain? No/denies    Pain Score 0-No pain    Multiple Pain Sites No             Capillary Blood Glucose: No results found for this or any previous visit (from the past 24 hour(s)).    Social History   Tobacco Use  Smoking Status Never  Smokeless Tobacco Never    Goals Met:  Proper associated with RPD/PD & O2 Sat Independence with exercise equipment Using PLB without cueing & demonstrates good technique Exercise tolerated well Queuing for purse lip breathing No report of concerns or symptoms today Strength training completed today  Goals Unmet:  Not Applicable  Comments: checkout at 1145.   Dr. Kathie Dike is Medical Director for [facility].

## 2023-01-29 NOTE — Progress Notes (Signed)
Pulmonary Individual Treatment Plan  Patient Details  Name: Tim Day MRN: [id number] Date of Birth: 15-Apr-1951 Referring Provider:   [facility] from 10/18/2022 in [place]  Referring Provider Dr. Melvyn Novas       Initial Encounter Date:  Flowsheet Row PULMONARY REHAB OTHER RESP ORIENTATION from 10/18/2022 in [place]  Date 10/18/22       Visit Diagnosis: Chronic respiratory failure with hypoxia and hypercapnia (HCC)  Patient's Home Medications on Admission:   Current Outpatient Medications:    ACCU-CHEK GUIDE test strip, USE TO TEST TWICE DAILY.D, Disp: , Rfl:    acetaminophen (TYLENOL) 500 MG tablet, Take 1,000 mg by mouth 2 (two) times daily as needed for moderate pain., Disp: , Rfl:    alclomethasone (ACLOVATE) 0.05 % cream, Apply topically 2 (two) times daily as needed (Rash)., Disp: 180 g, Rfl: 3   ALPRAZolam (XANAX) 0.25 MG tablet, Take 0.25 mg by mouth daily as needed for anxiety., Disp: , Rfl:    apixaban (ELIQUIS) 5 MG TABS tablet, Take 1 tablet (5 mg total) by mouth 2 (two) times daily., Disp: 60 tablet, Rfl: 3   ascorbic acid (VITAMIN C) 500 MG tablet, Take 1 tablet (500 mg total) by mouth daily., Disp: 30 tablet, Rfl: 0   blood glucose meter kit and supplies KIT, Dispense based on patient and insurance preference. Use up to four times daily as directed. (FOR ICD-9 250.00, 250.01)., Disp: 1 each, Rfl: 0   buPROPion (WELLBUTRIN XL) 150 MG 24 hr tablet, Take 150 mg by mouth in the morning., Disp: , Rfl:    Cholecalciferol (VITAMIN D3) 125 MCG (5000 UT) CAPS, Take 5,000 Units by mouth in the morning., Disp: , Rfl:    Chromium-Cinnamon (CINNAMON PLUS CHROMIUM PO), Take 1 tablet by mouth in the morning., Disp: , Rfl:    CO-ENZYME Q-10 PO, Take 100 mg by mouth in the morning., Disp: , Rfl:    famotidine (PEPCID) 20 MG tablet, TAKE ONE TABLET BY MOUTH DAILY AFTER SUPPER (Patient  taking differently: Take 20 mg by mouth in the morning.), Disp: 30 tablet, Rfl: 11   FIASP FLEXTOUCH 100 UNIT/ML FlexTouch Pen, Inject 2-8 Units into the skin every evening., Disp: , Rfl:    furosemide (LASIX) 40 MG tablet, Take 1 tablet (40 mg total) by mouth daily., Disp: 90 tablet, Rfl: 3   Insulin Pen Needle (PEN NEEDLES) 32G X 4 MM MISC, 1 Package by Does not apply route 4 (four) times daily -  before meals and at bedtime., Disp: 1 each, Rfl: 0   ipratropium (ATROVENT) 0.06 % nasal spray, Place 2 sprays into both nostrils 4 (four) times daily. (Patient taking differently: Place 2 sprays into both nostrils 4 (four) times daily as needed (allergies.).), Disp: 15 mL, Rfl: 12   Lancets (ONETOUCH DELICA PLUS MCNOBS96G) MISC, Apply topically 2 (two) times daily., Disp: , Rfl:    Multiple Vitamin (MULTIVITAMIN WITH MINERALS) TABS tablet, Take 1 tablet by mouth every evening., Disp: , Rfl:    pantoprazole (PROTONIX) 40 MG tablet, Take 40 mg by mouth at bedtime., Disp: , Rfl:    potassium chloride SA (KLOR-CON M) 20 MEQ tablet, Take 1 tablet (20 mEq total) by mouth daily., Disp: 90 tablet, Rfl: 3   rosuvastatin (CRESTOR) 5 MG tablet, Take 5 mg by mouth at bedtime., Disp: , Rfl:    sertraline (ZOLOFT) 100 MG tablet, Take 100 mg by mouth in the morning., Disp: ,  Rfl:    SYNJARDY XR 12.04-999 MG TB24, Take 1 tablet by mouth 2 (two) times daily., Disp: , Rfl:    tamsulosin (FLOMAX) 0.4 MG CAPS capsule, Take 0.4 mg by mouth at bedtime., Disp: , Rfl:    TOUJEO MAX SOLOSTAR 300 UNIT/ML Solostar Pen, Inject 20 Units into the skin in the morning., Disp: , Rfl:    zinc sulfate 220 (50 Zn) MG capsule, Take 1 capsule (220 mg total) by mouth daily. 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   Primary insomnia    Seborrheic dermatitis, unspecified    Squamous cell carcinoma of skin 06/14/2011   left forearm medial [id number]   Thrombocytopenia, unspecified (HCC)     Tobacco Use: Social History   Tobacco Use  Smoking Status Never  Smokeless Tobacco Never    Labs: Review Flowsheet  More data exists      Latest Ref Rng & Units 04/09/2021 04/10/2021 11/17/2021 05/06/2022 05/07/2022  Labs for ITP Cardiac and Pulmonary Rehab  Cholestrol 0 - 200 mg/dL - 91  - - -  LDL (calc) 0 - 99 mg/dL - 47  - - -  HDL-C >40 mg/dL - 13  - - -  Trlycerides <150 mg/dL - 156  146  - - -  Hemoglobin A1c 4.8 - 5.6 % 7.1  - 6.3  - -  PH, Arterial 7.35 - 7.45 7.350  7.359  C - 7.478  7.325  7.25  7.26  7.33   PCO2 arterial 32 - 48 mmHg 50.2  42.9  C - 32.5  59.5  87  77  84   Bicarbonate 20.0 - 28.0 mmol/L 27.7  23.3  C - 24.2  31.0  38.2  34.6  39.8  34.7  44.3   TCO2 22 - 32 mmol/L 29  - 25  33  - -  Acid-base deficit 0.0 - 2.0 mmol/L 1.1  C - - - -  O2 Saturation % 100.0  99.5  C - 99.0  100.0  97.4  94.7  97.3  96.6  93.9     Details      C Corrected result   Multiple values from one day are sorted in reverse-chronological order         Capillary Blood Glucose: Lab Results  Component Value Date   GLUCAP 224 (H) 10/29/2022   GLUCAP 275 (H) 10/18/2022   GLUCAP 190 (H) 05/11/2022   GLUCAP 131 (H) 05/11/2022   GLUCAP 157 (H) 05/11/2022     Pulmonary Assessment Scores:  Pulmonary Assessment Scores     Row Name 10/18/22 1341         ADL UCSD   ADL Phase Entry     SOB Score total 58     Rest 1     Walk 2     Stairs 4     Bath 2  Dress 2     Shop 3       CAT Score   CAT  Score 20       mMRC Score   mMRC Score 3             UCSD: Self-administered rating of dyspnea associated with activities of daily living (ADLs) 6-point scale (0 = "not at all" to 5 = "maximal or unable to do because of breathlessness")  Scoring Scores range from 0 to 120.  Minimally important difference is 5 units  CAT: CAT can identify the health impairment of COPD patients and is better correlated with disease progression.  CAT has a scoring range of zero to 40. The CAT score is classified into four groups of low (less than 10), medium (10 - 20), high (21-30) and very high (31-40) based on the impact level of disease on health status. A CAT score over 10 suggests significant symptoms.  A worsening CAT score could be explained by an exacerbation, poor medication adherence, poor inhaler technique, or progression of COPD or comorbid conditions.  CAT MCID is 2 points  mMRC: mMRC (Modified Medical Research Council) Dyspnea Scale is used to assess the degree of baseline functional disability in patients of respiratory disease due to dyspnea. No minimal important difference is established. A decrease in score of 1 point or greater is considered a positive change.   Pulmonary Function Assessment:   Exercise Target Goals: Exercise Program Goal: Individual exercise prescription set using results from initial 6 min walk test and THRR while considering  patient's activity barriers and safety.   Exercise Prescription Goal: Initial exercise prescription builds to 30-45 minutes a day of aerobic activity, 2-3 days per week.  Home exercise guidelines will be given to patient during program as part of exercise prescription that the participant will acknowledge.  Activity Barriers & Risk Stratification:  Activity Barriers & Cardiac Risk Stratification - 10/18/22 1244       Activity Barriers & Cardiac Risk Stratification   Activity Barriers Deconditioning;Shortness of Breath    Cardiac Risk  Stratification Moderate             6 Minute Walk:  6 Minute Walk     Row Name 10/18/22 1409         6 Minute Walk   Phase Initial     Distance 700 feet     Walk Time 6 minutes     # of Rest Breaks 2     MPH 1.32     METS 2.18     RPE 12     Perceived Dyspnea  13     Symptoms Yes (comment)     Comments Two standing breaks to due to SOB     Resting HR 93 bpm     Resting BP 90/50     Resting Oxygen Saturation  96 %     Exercise Oxygen Saturation  during 6 min walk 87 %     Max Ex. HR 122 bpm     Max Ex. BP 118/58     2 Minute Post BP 106/58       Interval HR   1 Minute HR 110     2 Minute HR 118     3 Minute HR 120     4 Minute HR 113     5 Minute HR 113     6 Minute HR 122     2 Minute Post HR 102  Interval Heart Rate? Yes       Interval Oxygen   Interval Oxygen? Yes     Baseline Oxygen Saturation % 96 %     1 Minute Oxygen Saturation % 90 %     1 Minute Liters of Oxygen 4 L     2 Minute Oxygen Saturation % 97 %     2 Minute Liters of Oxygen 4 L     3 Minute Oxygen Saturation % 97 %     3 Minute Liters of Oxygen 6 L     4 Minute Oxygen Saturation % 90 %     4 Minute Liters of Oxygen 6 L     5 Minute Oxygen Saturation % 90 %     5 Minute Liters of Oxygen 6 L     6 Minute Oxygen Saturation % 87 %     6 Minute Liters of Oxygen 6 L     2 Minute Post Oxygen Saturation % 94 %     2 Minute Post Liters of Oxygen 6 L              Oxygen Initial Assessment:  Oxygen Initial Assessment - 10/18/22 1344       Home Oxygen   Home Oxygen Device Home Concentrator;E-Tanks    Sleep Oxygen Prescription BiPAP;Continuous    Liters per minute 3    Home Exercise Oxygen Prescription Continuous    Liters per minute 4    Home Resting Oxygen Prescription Continuous    Liters per minute 2    Compliance with Home Oxygen Use Yes      Intervention   Short Term Goals To learn and exhibit compliance with exercise, home and travel O2 prescription;To learn and  understand importance of monitoring SPO2 with pulse oximeter and demonstrate accurate use of the pulse oximeter.;To learn and understand importance of maintaining oxygen saturations>88%;To learn and demonstrate proper pursed lip breathing techniques or other breathing techniques.     Long  Term Goals Exhibits compliance with exercise, home  and travel O2 prescription;Verbalizes importance of monitoring SPO2 with pulse oximeter and return demonstration;Maintenance of O2 saturations>88%;Exhibits proper breathing techniques, such as pursed lip breathing or other method taught during program session             Oxygen Re-Evaluation:  Oxygen Re-Evaluation     Row Name 10/18/22 1342 10/18/22 1343 11/05/22 1317 12/03/22 1010 12/31/22 1146     Program Oxygen Prescription   Program Oxygen Prescription -- -- Continuous Continuous Continuous   Liters per minute -- -- 6 6 8   $ Comments -- -- -- -- pt increases his oxygen to 8 liters while exercising     Home Oxygen   Home Oxygen Device -- -- Home Concentrator;E-Tanks Home Concentrator;E-Tanks Home Concentrator;E-Tanks   Sleep Oxygen Prescription -- -- BiPAP;Continuous BiPAP;Continuous BiPAP;Continuous   Liters per minute -- -- 3 3 3   $ Home Exercise Oxygen Prescription -- -- Continuous Continuous Continuous   Liters per minute -- -- 4 4 4   $ Home Resting Oxygen Prescription -- -- Continuous Continuous Continuous   Liters per minute -- -- 2 2 2   $ Compliance with Home Oxygen Use -- -- Yes -- Yes     Goals/Expected Outcomes   Short Term Goals -- -- To learn and exhibit compliance with exercise, home and travel O2 prescription;To learn and understand importance of monitoring SPO2 with pulse oximeter and demonstrate accurate use of the pulse oximeter.;To learn and understand importance of maintaining  oxygen saturations>88%;To learn and demonstrate proper pursed lip breathing techniques or other breathing techniques.  To learn and exhibit compliance with  exercise, home and travel O2 prescription;To learn and understand importance of monitoring SPO2 with pulse oximeter and demonstrate accurate use of the pulse oximeter.;To learn and understand importance of maintaining oxygen saturations>88%;To learn and demonstrate proper pursed lip breathing techniques or other breathing techniques.  To learn and exhibit compliance with exercise, home and travel O2 prescription;To learn and understand importance of monitoring SPO2 with pulse oximeter and demonstrate accurate use of the pulse oximeter.;To learn and understand importance of maintaining oxygen saturations>88%;To learn and demonstrate proper pursed lip breathing techniques or other breathing techniques.    Long  Term Goals -- -- Exhibits compliance with exercise, home  and travel O2 prescription;Verbalizes importance of monitoring SPO2 with pulse oximeter and return demonstration;Maintenance of O2 saturations>88%;Exhibits proper breathing techniques, such as pursed lip breathing or other method taught during program session Exhibits compliance with exercise, home  and travel O2 prescription;Verbalizes importance of monitoring SPO2 with pulse oximeter and return demonstration;Maintenance of O2 saturations>88%;Exhibits proper breathing techniques, such as pursed lip breathing or other method taught during program session Exhibits compliance with exercise, home  and travel O2 prescription;Verbalizes importance of monitoring SPO2 with pulse oximeter and return demonstration;Maintenance of O2 saturations>88%;Exhibits proper breathing techniques, such as pursed lip breathing or other method taught during program session   Goals/Expected Outcomes -- -- [place] Name 01/28/23 1302             Program Oxygen Prescription   Program Oxygen Prescription Continuous       Liters per minute 8       Comments pt increased his oxygen to 8 liters while exercising due to SOB         Home  Oxygen   Home Oxygen Device Home Concentrator;E-Tanks       Sleep Oxygen Prescription BiPAP;Continuous       Liters per minute 3       Home Exercise Oxygen Prescription Continuous       Liters per minute 4       Home Resting Oxygen Prescription Continuous       Liters per minute 2       Compliance with Home Oxygen Use Yes         Goals/Expected Outcomes   Short Term Goals To learn and exhibit compliance with exercise, home and travel O2 prescription;To learn and understand importance of monitoring SPO2 with pulse oximeter and demonstrate accurate use of the pulse oximeter.;To learn and understand importance of maintaining oxygen saturations>88%;To learn and demonstrate proper pursed lip breathing techniques or other breathing techniques.        Long  Term Goals Exhibits compliance with exercise, home  and travel O2 prescription;Verbalizes importance of monitoring SPO2 with pulse oximeter and return demonstration;Maintenance of O2 saturations>88%;Exhibits proper breathing techniques, such as pursed lip breathing or other method taught during program session       Goals/Expected Outcomes Complinace                Oxygen Discharge (Final Oxygen Re-Evaluation):  Oxygen Re-Evaluation - 01/28/23 1302       Program Oxygen Prescription   Program Oxygen Prescription Continuous    Liters per minute 8    Comments pt increased his oxygen to 8 liters while exercising due to SOB      Home Oxygen   Home Oxygen Device Home Concentrator;E-Tanks  Sleep Oxygen Prescription BiPAP;Continuous    Liters per minute 3    Home Exercise Oxygen Prescription Continuous    Liters per minute 4    Home Resting Oxygen Prescription Continuous    Liters per minute 2    Compliance with Home Oxygen Use Yes      Goals/Expected Outcomes   Short Term Goals To learn and exhibit compliance with exercise, home and travel O2 prescription;To learn and understand importance of monitoring SPO2 with pulse oximeter and  demonstrate accurate use of the pulse oximeter.;To learn and understand importance of maintaining oxygen saturations>88%;To learn and demonstrate proper pursed lip breathing techniques or other breathing techniques.     Long  Term Goals Exhibits compliance with exercise, home  and travel O2 prescription;Verbalizes importance of monitoring SPO2 with pulse oximeter and return demonstration;Maintenance of O2 saturations>88%;Exhibits proper breathing techniques, such as pursed lip breathing or other method taught during program session    Goals/Expected Outcomes Complinace             Initial Exercise Prescription:  Initial Exercise Prescription - 10/18/22 1400       Date of Initial Exercise RX and Referring Provider   Date 10/18/22    Referring Provider Dr. Melvyn Novas    Expected Discharge Date 02/25/23      Oxygen   Oxygen Continuous    Liters 6    Maintain Oxygen Saturation 88% or higher      NuStep   Level 1    SPM 60    Minutes 17      Arm Ergometer   Level 1    RPM 40    Minutes 22      Prescription Details   Frequency (times per week) 2    Duration Progress to 30 minutes of continuous aerobic without signs/symptoms of physical distress      Intensity   THRR 40-80% of Max Heartrate 60-121    Ratings of Perceived Exertion 11-13    Perceived Dyspnea 0-4      Resistance Training   Training Prescription Yes    Weight 2    Reps 10-15             Perform Capillary Blood Glucose checks as needed.  Exercise Prescription Changes:   Exercise Prescription Changes     Row Name 11/05/22 1300 11/19/22 1100 11/26/22 1100 11/28/22 1100 12/17/22 1100     Response to Exercise   Blood Pressure (Admit) 122/50 108/68 -- 110/70 108/64   Blood Pressure (Exercise) 126/50 110/60 -- 118/68 118/60   Blood Pressure (Exit) 110/60 104/64 -- 104/64 100/60   Heart Rate (Admit) 86 bpm 80 bpm -- 85 bpm 82 bpm   Heart Rate (Exercise) 95 bpm 105 bpm -- 103 bpm 92 bpm   Heart Rate (Exit)  88 bpm 94 bpm -- 93 bpm 90 bpm   Oxygen Saturation (Admit) 93 % 92 % -- 90 % 96 %   Oxygen Saturation (Exercise) 93 % 91 % -- 90 % 92 %   Oxygen Saturation (Exit) 88 % 95 % -- 90 % 96 %   Rating of Perceived Exertion (Exercise) 13 12 -- 12 12   Perceived Dyspnea (Exercise) 13 12 -- 12 12   Duration Continue with 30 min of aerobic exercise without signs/symptoms of physical distress. Continue with 30 min of aerobic exercise without signs/symptoms of physical distress. -- Continue with 30 min of aerobic exercise without signs/symptoms of physical distress. Continue with 30 min of aerobic exercise without  signs/symptoms of physical distress.   Intensity THRR unchanged THRR unchanged -- THRR unchanged THRR unchanged     Progression   Progression Continue to progress workloads to maintain intensity without signs/symptoms of physical distress. Continue to progress workloads to maintain intensity without signs/symptoms of physical distress. -- Continue to progress workloads to maintain intensity without signs/symptoms of physical distress. Continue to progress workloads to maintain intensity without signs/symptoms of physical distress.     Resistance Training   Training Prescription Yes Yes -- Yes Yes   Weight 5 5 -- 5 5   Reps 10-15 10-15 -- 10-15 10-15   Time 10 Minutes 10 Minutes -- 10 Minutes 10 Minutes     Oxygen   Oxygen Continuous Continuous -- Continuous Continuous   Liters 6 6 -- 6 6     NuStep   Level 2 2 -- 2 3   SPM 93 92 -- 98 94   Minutes 17 17 -- 17 17   METs 2.1 2.3 -- 2.3 2.3     Arm Ergometer   Level 1 2 -- 2.9 2.5   RPM 44 49 -- 49 45   Minutes 22 22 -- 22 22   METs 1.6 2 -- 2.3 2.8     Home Exercise Plan   Plans to continue exercise at -- -- Home (comment) -- --   Frequency -- -- Add 3 additional days to program exercise sessions. -- --   Initial Home Exercises Provided -- -- 11/26/22 -- --    [place] Name 12/31/22 1100 01/14/23 1200 01/28/23 1200         Response  to Exercise   Blood Pressure (Admit) 108/60 104/60 116/72     Blood Pressure (Exercise) 122/68 120/60 130/62     Blood Pressure (Exit) 106/60 102/58 104/64     Heart Rate (Admit) 79 bpm 87 bpm 86 bpm     Heart Rate (Exercise) 101 bpm 105 bpm 100 bpm     Heart Rate (Exit) 90 bpm 97 bpm 93 bpm     Oxygen Saturation (Admit) 100 % 93 % 90 %     Oxygen Saturation (Exercise) 93 % 90 % 91 %     Oxygen Saturation (Exit) 93 % 98 % 90 %     Rating of Perceived Exertion (Exercise) 12 12 12     $ Perceived Dyspnea (Exercise) 12 12 12     $ Duration Continue with 30 min of aerobic exercise without signs/symptoms of physical distress. Continue with 30 min of aerobic exercise without signs/symptoms of physical distress. Continue with 30 min of aerobic exercise without signs/symptoms of physical distress.     Intensity THRR unchanged THRR unchanged THRR unchanged       Progression   Progression Continue to progress workloads to maintain intensity without signs/symptoms of physical distress. Continue to progress workloads to maintain intensity without signs/symptoms of physical distress. Continue to progress workloads to maintain intensity without signs/symptoms of physical distress.       Resistance Training   Training Prescription Yes Yes Yes     Weight 5 5 5     $ Reps 10-15 10-15 10-15     Time 10 Minutes 10 Minutes 10 Minutes       Oxygen   Oxygen Continuous Continuous Continuous     Liters 8 8 8       $ NuStep   Level 3 2 2     $ SPM 97 98 75     Minutes 17 17 17  METs 2.4 2.2 2       Arm Ergometer   Level 3 3.2 3     RPM 50 46 47     Minutes 22 22 22     $ METs 2.4 2.3 1.9              Exercise Comments:   Exercise Comments     Row Name 11/26/22 1144           Exercise Comments home exercise reviewed                Exercise Goals and Review:   Exercise Goals     Row Name 10/18/22 1416 11/05/22 1315 12/03/22 1007 12/31/22 1143 01/28/23 1257     Exercise Goals    Increase Physical Activity Yes Yes Yes Yes Yes   Intervention Provide advice, education, support and counseling about physical activity/exercise needs.;Develop an individualized exercise prescription for aerobic and resistive training based on initial evaluation findings, risk stratification, comorbidities and participant's personal goals. Provide advice, education, support and counseling about physical activity/exercise needs.;Develop an individualized exercise prescription for aerobic and resistive training based on initial evaluation findings, risk stratification, comorbidities and participant's personal goals. Provide advice, education, support and counseling about physical activity/exercise needs.;Develop an individualized exercise prescription for aerobic and resistive training based on initial evaluation findings, risk stratification, comorbidities and participant's personal goals. Provide advice, education, support and counseling about physical activity/exercise needs.;Develop an individualized exercise prescription for aerobic and resistive training based on initial evaluation findings, risk stratification, comorbidities and participant's personal goals. Provide advice, education, support and counseling about physical activity/exercise needs.;Develop an individualized exercise prescription for aerobic and resistive training based on initial evaluation findings, risk stratification, comorbidities and participant's personal goals.   Expected Outcomes Short Term: Attend rehab on a regular basis to increase amount of physical activity.;Long Term: Exercising regularly at least 3-5 days a week.;Long Term: Add in home exercise to make exercise part of routine and to increase amount of physical activity. Short Term: Attend rehab on a regular basis to increase amount of physical activity.;Long Term: Exercising regularly at least 3-5 days a week.;Long Term: Add in home exercise to make exercise part of routine and  to increase amount of physical activity. Short Term: Attend rehab on a regular basis to increase amount of physical activity.;Long Term: Exercising regularly at least 3-5 days a week.;Long Term: Add in home exercise to make exercise part of routine and to increase amount of physical activity. Short Term: Attend rehab on a regular basis to increase amount of physical activity.;Long Term: Exercising regularly at least 3-5 days a week.;Long Term: Add in home exercise to make exercise part of routine and to increase amount of physical activity. Short Term: Attend rehab on a regular basis to increase amount of physical activity.;Long Term: Exercising regularly at least 3-5 days a week.;Long Term: Add in home exercise to make exercise part of routine and to increase amount of physical activity.   Increase Strength and Stamina Yes Yes Yes Yes Yes   Intervention Provide advice, education, support and counseling about physical activity/exercise needs.;Develop an individualized exercise prescription for aerobic and resistive training based on initial evaluation findings, risk stratification, comorbidities and participant's personal goals. Provide advice, education, support and counseling about physical activity/exercise needs.;Develop an individualized exercise prescription for aerobic and resistive training based on initial evaluation findings, risk stratification, comorbidities and participant's personal goals. Provide advice, education, support and counseling about physical activity/exercise needs.;Develop an individualized exercise prescription for aerobic  and resistive training based on initial evaluation findings, risk stratification, comorbidities and participant's personal goals. Provide advice, education, support and counseling about physical activity/exercise needs.;Develop an individualized exercise prescription for aerobic and resistive training based on initial evaluation findings, risk stratification,  comorbidities and participant's personal goals. Provide advice, education, support and counseling about physical activity/exercise needs.;Develop an individualized exercise prescription for aerobic and resistive training based on initial evaluation findings, risk stratification, comorbidities and participant's personal goals.   Expected Outcomes Short Term: Increase workloads from initial exercise prescription for resistance, speed, and METs.;Short Term: Perform resistance training exercises routinely during rehab and add in resistance training at home;Long Term: Improve cardiorespiratory fitness, muscular endurance and strength as measured by increased METs and functional capacity (6MWT) Short Term: Increase workloads from initial exercise prescription for resistance, speed, and METs.;Short Term: Perform resistance training exercises routinely during rehab and add in resistance training at home;Long Term: Improve cardiorespiratory fitness, muscular endurance and strength as measured by increased METs and functional capacity (6MWT) Short Term: Increase workloads from initial exercise prescription for resistance, speed, and METs.;Short Term: Perform resistance training exercises routinely during rehab and add in resistance training at home;Long Term: Improve cardiorespiratory fitness, muscular endurance and strength as measured by increased METs and functional capacity (6MWT) Short Term: Increase workloads from initial exercise prescription for resistance, speed, and METs.;Short Term: Perform resistance training exercises routinely during rehab and add in resistance training at home;Long Term: Improve cardiorespiratory fitness, muscular endurance and strength as measured by increased METs and functional capacity (6MWT) Short Term: Increase workloads from initial exercise prescription for resistance, speed, and METs.;Short Term: Perform resistance training exercises routinely during rehab and add in resistance  training at home;Long Term: Improve cardiorespiratory fitness, muscular endurance and strength as measured by increased METs and functional capacity (6MWT)   Able to understand and use rate of perceived exertion (RPE) scale Yes Yes Yes Yes Yes   Intervention Provide education and explanation on how to use RPE scale Provide education and explanation on how to use RPE scale Provide education and explanation on how to use RPE scale Provide education and explanation on how to use RPE scale Provide education and explanation on how to use RPE scale   Expected Outcomes Short Term: Able to use RPE daily in rehab to express subjective intensity level;Long Term:  Able to use RPE to guide intensity level when exercising independently Short Term: Able to use RPE daily in rehab to express subjective intensity level;Long Term:  Able to use RPE to guide intensity level when exercising independently Short Term: Able to use RPE daily in rehab to express subjective intensity level;Long Term:  Able to use RPE to guide intensity level when exercising independently Short Term: Able to use RPE daily in rehab to express subjective intensity level;Long Term:  Able to use RPE to guide intensity level when exercising independently Short Term: Able to use RPE daily in rehab to express subjective intensity level;Long Term:  Able to use RPE to guide intensity level when exercising independently   Able to understand and use Dyspnea scale Yes Yes Yes Yes Yes   Intervention Provide education and explanation on how to use Dyspnea scale Provide education and explanation on how to use Dyspnea scale Provide education and explanation on how to use Dyspnea scale Provide education and explanation on how to use Dyspnea scale Provide education and explanation on how to use Dyspnea scale   Expected Outcomes Short Term: Able to use Dyspnea scale daily in rehab to  express subjective sense of shortness of breath during exertion;Long Term: Able to use  Dyspnea scale to guide intensity level when exercising independently Short Term: Able to use Dyspnea scale daily in rehab to express subjective sense of shortness of breath during exertion;Long Term: Able to use Dyspnea scale to guide intensity level when exercising independently Short Term: Able to use Dyspnea scale daily in rehab to express subjective sense of shortness of breath during exertion;Long Term: Able to use Dyspnea scale to guide intensity level when exercising independently Short Term: Able to use Dyspnea scale daily in rehab to express subjective sense of shortness of breath during exertion;Long Term: Able to use Dyspnea scale to guide intensity level when exercising independently Short Term: Able to use Dyspnea scale daily in rehab to express subjective sense of shortness of breath during exertion;Long Term: Able to use Dyspnea scale to guide intensity level when exercising independently   Knowledge and understanding of Target Heart Rate Range (THRR) Yes Yes Yes Yes Yes   Intervention Provide education and explanation of THRR including how the numbers were predicted and where they are located for reference Provide education and explanation of THRR including how the numbers were predicted and where they are located for reference Provide education and explanation of THRR including how the numbers were predicted and where they are located for reference Provide education and explanation of THRR including how the numbers were predicted and where they are located for reference Provide education and explanation of THRR including how the numbers were predicted and where they are located for reference   Expected Outcomes Long Term: Able to use THRR to govern intensity when exercising independently;Short Term: Able to use daily as guideline for intensity in rehab;Short Term: Able to state/look up THRR Long Term: Able to use THRR to govern intensity when exercising independently;Short Term: Able to use daily  as guideline for intensity in rehab;Short Term: Able to state/look up THRR Long Term: Able to use THRR to govern intensity when exercising independently;Short Term: Able to use daily as guideline for intensity in rehab;Short Term: Able to state/look up THRR Long Term: Able to use THRR to govern intensity when exercising independently;Short Term: Able to use daily as guideline for intensity in rehab;Short Term: Able to state/look up THRR Long Term: Able to use THRR to govern intensity when exercising independently;Short Term: Able to use daily as guideline for intensity in rehab;Short Term: Able to state/look up THRR   Understanding of Exercise Prescription Yes Yes Yes Yes Yes   Intervention Provide education, explanation, and written materials on patient's individual exercise prescription Provide education, explanation, and written materials on patient's individual exercise prescription Provide education, explanation, and written materials on patient's individual exercise prescription Provide education, explanation, and written materials on patient's individual exercise prescription Provide education, explanation, and written materials on patient's individual exercise prescription   Expected Outcomes Short Term: Able to explain program exercise prescription;Long Term: Able to explain home exercise prescription to exercise independently Short Term: Able to explain program exercise prescription;Long Term: Able to explain home exercise prescription to exercise independently Short Term: Able to explain program exercise prescription;Long Term: Able to explain home exercise prescription to exercise independently Short Term: Able to explain program exercise prescription;Long Term: Able to explain home exercise prescription to exercise independently Short Term: Able to explain program exercise prescription;Long Term: Able to explain home exercise prescription to exercise independently            Exercise Goals  Re-Evaluation :  Exercise  Goals Re-Evaluation     Row Name 11/05/22 1315 12/03/22 1008 12/31/22 1143 01/28/23 1258       Exercise Goal Re-Evaluation   Exercise Goals Review Increase Strength and Stamina;Increase Physical Activity;Able to understand and use rate of perceived exertion (RPE) scale;Able to understand and use Dyspnea scale;Knowledge and understanding of Target Heart Rate Range (THRR);Understanding of Exercise Prescription Increase Physical Activity;Increase Strength and Stamina;Able to understand and use Dyspnea scale;Able to understand and use rate of perceived exertion (RPE) scale;Knowledge and understanding of Target Heart Rate Range (THRR);Understanding of Exercise Prescription Increase Physical Activity;Increase Strength and Stamina;Able to understand and use Dyspnea scale;Able to understand and use rate of perceived exertion (RPE) scale;Knowledge and understanding of Target Heart Rate Range (THRR);Understanding of Exercise Prescription Increase Physical Activity;Increase Strength and Stamina;Able to understand and use rate of perceived exertion (RPE) scale;Able to understand and use Dyspnea scale;Knowledge and understanding of Target Heart Rate Range (THRR);Understanding of Exercise Prescription    Comments Pt has completed 3 sessions of PR. He is progressing his level on the stepper after 3 classes. He is motivated during class and enjoys coming. He is currently exercising at 2.1 METs on the stepper. Will continue to monitor and progress as able. Pt has completed 10 sessions of PR. He continues to progress during each session increasing his level. He is motivated and enjoys coming to class. He is currently exercising at home on his own by walking and using his own equipment/weights. He is currently exercising at 2.3 METs on the stepper. Will continue to monitor and progress as able. Pt has completed 18 sessions of PR. He continues to progress during the sessions by increasing his SPM and  RPM on each equipment. He enjoys coming to class. He is currently exercising at 2.4 METs on the stepper. He continues to exercise at home on his off days. Will continue to monitor and progress as able. Pt has completed 25 sessions of pR. He tolerates exercise well and enjoys coming to class. He recently has been SOB more during exercise with lower oxygen stats during exercise needing to increase oxygen to 8L instead of 6L. He does not go back to pulm. until May. He is currently exercising at 2.0 METs on the stepepr. Will continue to monitor and progess as able.    Expected Outcomes Through exercise at home and at rehab, the patient will meet their stated goals. Through exercise at home and at rehab, the patient will meet their stated goals. Through exercise at home and at rehab, the patient will meet their stated goals. Through exercise at home and at rehab, the patient will meet their stated goals.             Discharge Exercise Prescription (Final Exercise Prescription Changes):  Exercise Prescription Changes - 01/28/23 1200       Response to Exercise   Blood Pressure (Admit) 116/72    Blood Pressure (Exercise) 130/62    Blood Pressure (Exit) 104/64    Heart Rate (Admit) 86 bpm    Heart Rate (Exercise) 100 bpm    Heart Rate (Exit) 93 bpm    Oxygen Saturation (Admit) 90 %    Oxygen Saturation (Exercise) 91 %    Oxygen Saturation (Exit) 90 %    Rating of Perceived Exertion (Exercise) 12    Perceived Dyspnea (Exercise) 12    Duration Continue with 30 min of aerobic exercise without signs/symptoms of physical distress.    Intensity THRR unchanged      Progression  Progression Continue to progress workloads to maintain intensity without signs/symptoms of physical distress.      Resistance Training   Training Prescription Yes    Weight 5    Reps 10-15    Time 10 Minutes      Oxygen   Oxygen Continuous    Liters 8      NuStep   Level 2    SPM 75    Minutes 17    METs 2       Arm Ergometer   Level 3    RPM 47    Minutes 22    METs 1.9             Nutrition:  Target Goals: Understanding of nutrition guidelines, daily intake of sodium <1528m, cholesterol <2029m calories 30% from fat and 7% or less from saturated fats, daily to have 5 or more servings of fruits and vegetables.  Biometrics:  Pre Biometrics - 10/18/22 1416       Pre Biometrics   Height 5' 10"$  (1.778 m)    Weight 80.6 kg    Waist Circumference 38 inches    Hip Circumference 37 inches    Waist to Hip Ratio 1.03 %    BMI (Calculated) 25.5    Triceps Skinfold 10 mm    Grip Strength 23.4 kg    Flexibility 0 in    Single Leg Stand 0 seconds              Nutrition Therapy Plan and Nutrition Goals:  Nutrition Therapy & Goals - 10/18/22 1335       Personal Nutrition Goals   Comments Patient scored 88 on his diet assessment. Handout provided and explained to he and his wife regarding healthy nutrition. We offer 2 educational sessions on heart healthy nutrition with handouts and assistance with RD referral if patient is interested.      Intervention Plan   Intervention Nutrition handout(s) given to patient.    Expected Outcomes Short Term Goal: Understand basic principles of dietary content, such as calories, fat, sodium, cholesterol and nutrients.             Nutrition Assessments:  Nutrition Assessments - 10/18/22 1335       MEDFICTS Scores   Pre Score 88            MEDIFICTS Score Key: ?70 Need to make dietary changes  40-70 Heart Healthy Diet ? 40 Therapeutic Level Cholesterol Diet   Picture Your Plate Scores: <[id number] dietary pattern with much room for improvement. 41-50 Dietary pattern unlikely to meet recommendations for good health and room for improvement. 51-60 More healthful dietary pattern, with some room for improvement.  >60 Healthy dietary pattern, although there may be some specific behaviors that could be improved.    Nutrition Goals  Re-Evaluation:   Nutrition Goals Discharge (Final Nutrition Goals Re-Evaluation):   Psychosocial: Target Goals: Acknowledge presence or absence of significant depression and/or stress, maximize coping skills, provide positive support system. Participant is able to verbalize types and ability to use techniques and skills needed for reducing stress and depression.  Initial Review & Psychosocial Screening:  Initial Psych Review & Screening - 10/18/22 1345       Initial Review   Current issues with Current Depression;Current Anxiety/Panic;Current Sleep Concerns      Family Dynamics   Good Support System? Yes      Barriers   Psychosocial barriers to participate in program There are no identifiable barriers or psychosocial  needs.      Screening Interventions   Interventions Encouraged to exercise;Provide feedback about the scores to participant    Expected Outcomes Short Term goal: Identification and review with participant of any Quality of Life or Depression concerns found by scoring the questionnaire.             Quality of Life Scores:  Quality of Life - 10/18/22 1417       Quality of Life   Select Quality of Life      Quality of Life Scores   Health/Function Pre 16.8 %    Socioeconomic Pre 29.14 %    Psych/Spiritual Pre 18 %    Family Pre 30 %    GLOBAL Pre 21.53 %            Scores of 19 and below usually indicate a poorer quality of life in these areas.  A difference of  2-3 points is a clinically meaningful difference.  A difference of 2-3 points in the total score of the Quality of Life Index has been associated with significant improvement in overall quality of life, self-image, physical symptoms, and general health in studies assessing change in quality of life.   PHQ-9: Review Flowsheet       10/18/2022  Depression screen PHQ 2/9  Decreased Interest 1  Down, Depressed, Hopeless 1  PHQ - 2 Score 2  Altered sleeping 2  Tired, decreased energy 1   Change in appetite 0  Feeling bad or failure about yourself  1  Trouble concentrating 0  Moving slowly or fidgety/restless 0  Suicidal thoughts 0  PHQ-9 Score 6  Difficult doing work/chores Somewhat difficult   Interpretation of Total Score  Total Score Depression Severity:  1-4 = Minimal depression, 5-9 = Mild depression, 10-14 = Moderate depression, 15-19 = Moderately severe depression, 20-27 = Severe depression   Psychosocial Evaluation and Intervention:  Psychosocial Evaluation - 10/18/22 1348       Psychosocial Evaluation & Interventions   Interventions Stress management education;Relaxation education;Encouraged to exercise with the program and follow exercise prescription    Comments Patient has no psychosocial barriers identified at his orientation visit. His initial PHQ-9 score was 6. Patient contracted COVID 19 in August of 2021. 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 He also exercised routinely. He was hospitalized for at least a month and was discharged to a SNF proir to going home with continuous O2. He has had depression since COVID and is currenlty taking Wellbutrin XL 150 mg and Sertraline 100 mg. His wife says his depression has improved a lot and they feel it is managed well with the medication. He has Alprazelam 0.25 mg for anxiety which he does not take a lot of now. He did have a lot of anxiety and fear of getting SOB. He wears a pulse oximetry constantly. His wife says she believes he does this so he will not get so SOB that he panics. He reports difficulty sleeping for the past month. He has not tried any sleep aide. He is currently exercising by walking short distances at least one time daily. He is ready to start the program hoping to be able to do more activities around the house and exercise more.    Expected Outcomes Patient will continue to have no psychosocial barriers identified and his depression  will continue to be managed.    Continue Psychosocial Services  No Follow up  required             Psychosocial Re-Evaluation:  Psychosocial Re-Evaluation     Row Name 10/29/22 1108 11/22/22 0738 12/25/22 1307 01/23/23 0843       Psychosocial Re-Evaluation   Current issues with -- -- Current Depression;Current Anxiety/Panic;Current Sleep Concerns Current Depression;Current Anxiety/Panic;Current Sleep Concerns    Comments Pt starting program on 10/29/2022.  Will monitor and help patient reach his goals and improve quality of life. Patrick Jupiter has very supportive family.  Tolerating program well.  Maintaining oxygen at 2-6liters with Sats 91-97%.  Pt is on 3 liters during warm-up and on 4-6 liters during execising.  Attendance is consistent.  Seems to enjoy coming to the program and demonstrates an interest in improving his health.  Patient has no current psycosocial issues. Patient has completed 16 sessions. He continue to have no psychossocial barriers identified. He is doing well in the program. His depression and anxiety continue to be managed with Wellbutrin and Zoloft. Patient continues to enjoy the sessions and demonstrates an interest in improving his health. We will continue to monitor his progress. Patient has completed 24 sessions  He continues to have no psychosocial barriers identified.  He is doing well in the program.  His depression and anxiety continue to be managed with Wellbutrin and Zoloft, and his anxiety managed with Alprazolam as needed.  Pt continues to enjoy the sessions and demonstrates an interest in improving his health.  We will continue to monitor his progress.    Expected Outcomes Will complete program, meeting both personal and program goals. Will complete program, meeting both personal and program goals. Patient will continue to have no psychosocial barriers identified. Patient will continue to have no psychosocial barriers identified.    Interventions Stress management  education;Encouraged to attend Cardiac Rehabilitation for the exercise;Encouraged to attend Pulmonary Rehabilitation for the exercise;Relaxation education Stress management education;Encouraged to attend Cardiac Rehabilitation for the exercise;Encouraged to attend Pulmonary Rehabilitation for the exercise;Relaxation education Stress management education;Encouraged to attend Pulmonary Rehabilitation for the exercise;Relaxation education Stress management education;Encouraged to attend Pulmonary Rehabilitation for the exercise;Relaxation education    Continue Psychosocial Services  No Follow up required No Follow up required No Follow up required No Follow up required      Initial Review   Source of Stress Concerns Chronic Illness Chronic Illness  Pt is on continuous home oxygen. -- --             Psychosocial Discharge (Final Psychosocial Re-Evaluation):  Psychosocial Re-Evaluation - 01/23/23 [id number]       Psychosocial Re-Evaluation   Current issues with Current Depression;Current Anxiety/Panic;Current Sleep Concerns    Comments Patient has completed 24 sessions  He continues to have no psychosocial barriers identified.  He is doing well in the program.  His depression and anxiety continue to be managed with Wellbutrin and Zoloft, and his anxiety managed with Alprazolam as needed.  Pt continues to enjoy the sessions and demonstrates an interest in improving his health.  We will continue to monitor his progress.    Expected Outcomes Patient will continue to have no psychosocial barriers identified.    Interventions Stress management education;Encouraged to attend Pulmonary Rehabilitation for the exercise;Relaxation education    Continue Psychosocial Services  No Follow up required              Education: Education Goals: Education classes will be provided on a weekly basis, covering required topics. Participant will state understanding/return demonstration of topics presented.  Learning  Barriers/Preferences:  Learning Barriers/Preferences - 10/18/22 1336       Learning Barriers/Preferences   Learning Barriers None    Learning Preferences Audio             Education Topics: How Lungs Work and Diseases: - Discuss the anatomy of the lungs and diseases that can affect the lungs, such as COPD. Flowsheet Row PULMONARY REHAB OTHER RESPIRATORY from 01/16/2023 in [place]  Date 12/05/22  Educator HB  Instruction Review Code 1- Verbalizes Understanding       Exercise: -Discuss the importance of exercise, FITT principles of exercise, normal and abnormal responses to exercise, and how to exercise safely.   Environmental Irritants: -Discuss types of environmental irritants and how to limit exposure to environmental irritants. Flowsheet Row PULMONARY REHAB OTHER RESPIRATORY from 01/16/2023 in [place]  Date 12/12/22  Educator HB  Instruction Review Code 1- Verbalizes Understanding       Meds/Inhalers and oxygen: - Discuss respiratory medications, definition of an inhaler and oxygen, and the proper way to use an inhaler and oxygen.   Energy Saving Techniques: - Discuss methods to conserve energy and decrease shortness of breath when performing activities of daily living.  Flowsheet Row PULMONARY REHAB OTHER RESPIRATORY from 01/16/2023 in [place]  Date 12/26/22  Educator HB  Instruction Review Code 1- Verbalizes Understanding       Bronchial Hygiene / Breathing Techniques: - Discuss breathing mechanics, pursed-lip breathing technique,  proper posture, effective ways to clear airways, and other functional breathing techniques Flowsheet Row PULMONARY REHAB OTHER RESPIRATORY from 01/16/2023 in [facility]  Date 01/02/23  Educator HB  Instruction Review Code 1- Research scientist (medical): - Provides group verbal and written instruction  about the health risks of elevated stress, cause of high stress, and healthy ways to reduce stress. Flowsheet Row PULMONARY REHAB OTHER RESPIRATORY from 01/16/2023 in [place]  Date 01/09/23  Educator HB  Instruction Review Code 1- Verbalizes Understanding       Nutrition I: Fats: - Discuss the types of cholesterol, what cholesterol does to the body, and how cholesterol levels can be controlled. Flowsheet Row PULMONARY REHAB OTHER RESPIRATORY from 01/16/2023 in [place]  Date 01/16/23  Educator DF  Instruction Review Code 1- Verbalizes Understanding       Nutrition II: Labels: -Discuss the different components of food labels and how to read food labels. Flowsheet Row PULMONARY REHAB OTHER RESPIRATORY from 01/16/2023 in [place]  Date 10/31/22  Educator HB  Instruction Review Code 1- Verbalizes Understanding       Respiratory Infections: - Discuss the signs and symptoms of respiratory infections, ways to prevent respiratory infections, and the importance of seeking medical treatment when having a respiratory infection.   Stress I: Signs and Symptoms: - Discuss the causes of stress, how stress may lead to anxiety and depression, and ways to limit stress. Flowsheet Row PULMONARY REHAB OTHER RESPIRATORY from 01/16/2023 in [place]  Date 11/14/22  Educator DF  Instruction Review Code 2- Demonstrated Understanding       Stress II: Relaxation: -Discuss relaxation techniques to limit stress. Flowsheet Row PULMONARY REHAB OTHER RESPIRATORY from 01/16/2023 in [place]  Date 11/21/22  Educator HB  Instruction Review Code 1- Verbalizes Understanding       Oxygen for Home/Travel: - Discuss how to prepare for travel when on oxygen  and proper ways to transport and store oxygen to ensure safety. Flowsheet Row PULMONARY REHAB OTHER RESPIRATORY from 01/16/2023 in  [place]  Date 11/28/22  Educator DF  Instruction Review Code 2- Demonstrated Understanding       Knowledge Questionnaire Score:  Knowledge Questionnaire Score - 10/18/22 1336       Knowledge Questionnaire Score   Pre Score 14/18             Core Components/Risk Factors/Patient Goals at Admission:  Personal Goals and Risk Factors at Admission - 10/18/22 1336       Core Components/Risk Factors/Patient Goals on Admission    Weight Management Weight Maintenance    Improve shortness of breath with ADL's Yes    Intervention Provide education, individualized exercise plan and daily activity instruction to help decrease symptoms of SOB with activities of daily living.    Expected Outcomes Short Term: Improve cardiorespiratory fitness to achieve a reduction of symptoms when performing ADLs;Long Term: Be able to perform more ADLs without symptoms or delay the onset of symptoms    Diabetes Yes    Intervention Provide education about signs/symptoms and action to take for hypo/hyperglycemia.;Provide education about proper nutrition, including hydration, and aerobic/resistive exercise prescription along with prescribed medications to achieve blood glucose in normal ranges: Fasting glucose 65-99 mg/dL    Expected Outcomes Short Term: Participant verbalizes understanding of the signs/symptoms and immediate care of hyper/hypoglycemia, proper foot care and importance of medication, aerobic/resistive exercise and nutrition plan for blood glucose control.    Personal Goal Other Yes    Personal Goal Patient wants to be able to do normal tasks around the house and be able to exercise. He wants to improve his SOB.    Intervention Patient will attend PR 2 days/week with exercise and education.    Expected Outcomes Patient will complete the program meeting both personal and program goals.             Core Components/Risk Factors/Patient Goals Review:   Goals and Risk  Factor Review     Row Name 10/29/22 1111 11/22/22 0750 12/25/22 1318 01/23/23 0847       Core Components/Risk Factors/Patient Goals Review   Personal Goals Review Develop more efficient breathing techniques such as purse lipped breathing and diaphragmatic breathing and practicing self-pacing with activity.;Increase knowledge of respiratory medications and ability to use respiratory devices properly.;Diabetes;Improve shortness of breath with ADL's Develop more efficient breathing techniques such as purse lipped breathing and diaphragmatic breathing and practicing self-pacing with activity.;Increase knowledge of respiratory medications and ability to use respiratory devices properly.;Diabetes;Improve shortness of breath with ADL's Weight Management/Obesity;Improve shortness of breath with ADL's;Diabetes;Other;Develop more efficient breathing techniques such as purse lipped breathing and diaphragmatic breathing and practicing self-pacing with activity. Weight Management/Obesity;Improve shortness of breath with ADL's;Other;Develop more efficient breathing techniques such as purse lipped breathing and diaphragmatic breathing and practicing self-pacing with activity.;Diabetes    Review To begin program 10/29/2022.  Pt is on Oxygen at 1-2 liters at rest and 5-6 liters with exercise at home.  Has suffered with long COVID since 07/2020, requiring oxygen. To begin program 10/29/2022.  Has suffered with long COVID since 07/2020, requiring oxygen.  Tolerating PR using Nu-Step and AE.  Vital signs are stable. Patient has completed 16 sessions. His current weight is 82.3 kg up 1.1 kg from last 30 day review. He is doing well in the program with consistent attendance and progressions. His blood pressure is at goal. He is exercising on  6 to 8 L of O2 via [place] with O2 Sats at 91 to 95%. His personal goals for the program are to be able to do normal activities around the house; exercise; and improve SOB. We will continue to  monitor his progress as he works toward meeting these goals. Pt has completed 24 sessions.  His current weight is 83.1 kg, which is slightly less than his initial weight of 84 kg.  He is doing well in the program with conistant attendance and progressions.  His blood pressure is at goal.  He is exercising on 6-8 L 02 via [place] with 02 sats from 90-98%.  His personal goals for the program are to be able to do normal activities around the house, as well as be able to exercise and improve his SOB.  We will continue to monitor his progress as he works toward meeting these goals.    Expected Outcomes Plan of care is ongoing.  No further concerns at this time. Plan of care is ongoing.  No further concerns at this time. Patient will complete the program and meet both personal and program goals. Patient will complete the program and meet both personal and program goals.             Core Components/Risk Factors/Patient Goals at Discharge (Final Review):   Goals and Risk Factor Review - 01/23/23 0847       Core Components/Risk Factors/Patient Goals Review   Personal Goals Review Weight Management/Obesity;Improve shortness of breath with ADL's;Other;Develop more efficient breathing techniques such as purse lipped breathing and diaphragmatic breathing and practicing self-pacing with activity.;Diabetes    Review Pt has completed 24 sessions.  His current weight is 83.1 kg, which is slightly less than his initial weight of 84 kg.  He is doing well in the program with conistant attendance and progressions.  His blood pressure is at goal.  He is exercising on 6-8 L 02 via [place] with 02 sats from 90-98%.  His personal goals for the program are to be able to do normal activities around the house, as well as be able to exercise and improve his SOB.  We will continue to monitor his progress as he works toward meeting these goals.    Expected Outcomes Patient will complete the program and meet both personal and program goals.              ITP Comments:   Comments: ITP REVIEW Pt is making expected progress toward pulmonary rehab goals after completing 26 sessions. Recommend continued exercise, life style modification, education, and utilization of breathing techniques to increase stamina and strength and decrease shortness of breath with exertion.

## 2023-01-30 ENCOUNTER — Encounter (HOSPITAL_COMMUNITY)
Admission: RE | Admit: 2023-01-30 | Discharge: 2023-01-30 | Disposition: A | Discharge status: Home / Self Care | Payer: PPO | Source: Ambulatory Visit | Attending: Internal Medicine | Admitting: Internal Medicine

## 2023-01-30 DIAGNOSIS — J9611 Chronic respiratory failure with hypoxia: Secondary | ICD-10-CM | POA: Diagnosis not present

## 2023-01-30 NOTE — Progress Notes (Signed)
Daily Session Note  Patient Details  Name: Tim Day MRN: [id number] Date of Birth: November 06, 1951 Referring Provider:   Flowsheet Row PULMONARY REHAB OTHER RESP ORIENTATION from 10/18/2022 in [place]  Referring Provider Dr. Melvyn Novas       Encounter Date: 01/30/2023  Check In:  Session Check In - 01/30/23 1046       Check-In   Supervising physician immediately available to respond to emergencies [facility] MD immediately available    Physician(s) Dr Harl Bowie    Location [facility]    Staff Present Hoy Register MHA, MS, ACSM-CEP;Leana Roe, BS, Exercise Physiologist;Freeman Borba Hassell Done, RN, BSN    Virtual Visit No    Medication changes reported     No    Fall or balance concerns reported    Yes    Comments Patient says he loses his balance frequently.    Tobacco Cessation No Change    Warm-up and Cool-down Performed as group-led instruction    Resistance Training Performed Yes    VAD Patient? No    PAD/SET Patient? No      Pain Assessment   Currently in Pain? No/denies    Pain Score 0-No pain    Multiple Pain Sites No             Capillary Blood Glucose: No results found for this or any previous visit (from the past 24 hour(s)).    Social History   Tobacco Use  Smoking Status Never  Smokeless Tobacco Never    Goals Met:  Proper associated with RPD/PD & O2 Sat Independence with exercise equipment Using PLB without cueing & demonstrates good technique Exercise tolerated well Queuing for purse lip breathing No report of concerns or symptoms today Strength training completed today  Goals Unmet:  Not Applicable  Comments: Checkout at 1145.   Dr. Kathie Dike is Medical Director for [facility].

## 2023-02-04 ENCOUNTER — Encounter (HOSPITAL_COMMUNITY)
Admission: RE | Admit: 2023-02-04 | Discharge: 2023-02-04 | Disposition: A | Discharge status: Home / Self Care | Payer: PPO | Source: Ambulatory Visit | Attending: Internal Medicine | Admitting: Internal Medicine

## 2023-02-04 DIAGNOSIS — J9611 Chronic respiratory failure with hypoxia: Secondary | ICD-10-CM

## 2023-02-04 DIAGNOSIS — J9612 Chronic respiratory failure with hypercapnia: Secondary | ICD-10-CM | POA: Diagnosis not present

## 2023-02-04 NOTE — Progress Notes (Signed)
Daily Session Note  Patient Details  Name: Tim Day MRN: [id number] Date of Birth: 1950-12-23 Referring Provider:   Flowsheet Row PULMONARY REHAB OTHER RESP ORIENTATION from 10/18/2022 in [place]  Referring Provider Dr. Melvyn Novas       Encounter Date: 02/04/2023  Check In:  Session Check In - 02/04/23 1041       Check-In   Supervising physician immediately available to respond to emergencies [facility] MD immediately available    Physician(s) Dr Harrington Challenger    Location [facility]    Staff Present Hoy Register MHA, MS, ACSM-CEP;Leana Roe, BS, Exercise Physiologist;Asmara Backs Hassell Done, RN, BSN    Virtual Visit No    Medication changes reported     No    Comments Patient says he loses his balance frequently.    Tobacco Cessation No Change    Warm-up and Cool-down Performed as group-led instruction    Resistance Training Performed Yes    VAD Patient? No    PAD/SET Patient? No      Pain Assessment   Currently in Pain? No/denies    Pain Score 0-No pain    Multiple Pain Sites No             Capillary Blood Glucose: No results found for this or any previous visit (from the past 24 hour(s)).    Social History   Tobacco Use  Smoking Status Never  Smokeless Tobacco Never    Goals Met:  Proper associated with RPD/PD & O2 Sat Independence with exercise equipment Using PLB without cueing & demonstrates good technique Exercise tolerated well Queuing for purse lip breathing No report of concerns or symptoms today Strength training completed today  Goals Unmet:  Not Applicable  Comments: Checkout at 1145.   Dr. Kathie Dike is Medical Director for [facility].

## 2023-02-06 ENCOUNTER — Encounter (HOSPITAL_COMMUNITY)
Admission: RE | Admit: 2023-02-06 | Discharge: 2023-02-06 | Disposition: A | Discharge status: Home / Self Care | Payer: PPO | Source: Ambulatory Visit | Attending: Internal Medicine | Admitting: Internal Medicine

## 2023-02-06 DIAGNOSIS — J9611 Chronic respiratory failure with hypoxia: Secondary | ICD-10-CM | POA: Diagnosis not present

## 2023-02-06 NOTE — Progress Notes (Signed)
Daily Session Note  Patient Details  Name: Tim Day MRN: [id number] Date of Birth: 10-12-1951 Referring Provider:   Flowsheet Row PULMONARY REHAB OTHER RESP ORIENTATION from 10/18/2022 in [place]  Referring Provider Dr. Melvyn Novas       Encounter Date: 02/06/2023  Check In:  Session Check In - 02/06/23 1045       Check-In   Supervising physician immediately available to respond to emergencies [facility] MD immediately available    Physician(s) Dr. Harl Bowie    Location [facility]    Staff Present Leana Roe, BS, Exercise Physiologist;Dalton Sherrie George, MS, ACSM-CEP;Melven Sartorius BSN, RN    Virtual Visit No    Medication changes reported     No    Fall or balance concerns reported    Yes    Comments Patient says he loses his balance frequently.    Tobacco Cessation No Change    Warm-up and Cool-down Performed as group-led instruction    Resistance Training Performed Yes    VAD Patient? No    PAD/SET Patient? No      Pain Assessment   Currently in Pain? No/denies    Pain Score 0-No pain    Multiple Pain Sites No             Capillary Blood Glucose: No results found for this or any previous visit (from the past 24 hour(s)).    Social History   Tobacco Use  Smoking Status Never  Smokeless Tobacco Never    Goals Met:  Proper associated with RPD/PD & O2 Sat Independence with exercise equipment Using PLB without cueing & demonstrates good technique Exercise tolerated well Queuing for purse lip breathing No report of concerns or symptoms today Strength training completed today  Goals Unmet:  Not Applicable  Comments: check out at 11:45   Dr. Kathie Dike is Medical Director for [facility].

## 2023-02-09 DIAGNOSIS — J8410 Pulmonary fibrosis, unspecified: Secondary | ICD-10-CM | POA: Diagnosis not present

## 2023-02-09 DIAGNOSIS — J9611 Chronic respiratory failure with hypoxia: Secondary | ICD-10-CM | POA: Diagnosis not present

## 2023-02-09 DIAGNOSIS — I5040 Unspecified combined systolic (congestive) and diastolic (congestive) heart failure: Secondary | ICD-10-CM | POA: Diagnosis not present

## 2023-02-10 DIAGNOSIS — J029 Acute pharyngitis, unspecified: Secondary | ICD-10-CM | POA: Diagnosis not present

## 2023-02-10 DIAGNOSIS — J069 Acute upper respiratory infection, unspecified: Secondary | ICD-10-CM | POA: Diagnosis not present

## 2023-02-11 ENCOUNTER — Encounter (HOSPITAL_COMMUNITY): Payer: PPO

## 2023-02-11 DIAGNOSIS — Q540 Hypospadias, balanic: Secondary | ICD-10-CM | POA: Diagnosis not present

## 2023-02-11 DIAGNOSIS — N5201 Erectile dysfunction due to arterial insufficiency: Secondary | ICD-10-CM | POA: Diagnosis not present

## 2023-02-11 DIAGNOSIS — R338 Other retention of urine: Secondary | ICD-10-CM | POA: Diagnosis not present

## 2023-02-13 ENCOUNTER — Encounter (HOSPITAL_COMMUNITY)
Admission: RE | Admit: 2023-02-13 | Discharge: 2023-02-13 | Disposition: A | Discharge status: Home / Self Care | Payer: PPO | Source: Ambulatory Visit | Attending: Internal Medicine | Admitting: Internal Medicine

## 2023-02-13 DIAGNOSIS — J9611 Chronic respiratory failure with hypoxia: Secondary | ICD-10-CM | POA: Diagnosis not present

## 2023-02-13 NOTE — Progress Notes (Signed)
Daily Session Note  Patient Details  Name: Tim Day MRN: [id number] Date of Birth: 02-21-1951 Referring Provider:   Flowsheet Row PULMONARY REHAB OTHER RESP ORIENTATION from 10/18/2022 in [place]  Referring Provider Dr. Melvyn Novas       Encounter Date: 02/13/2023  Check In:  Session Check In - 02/13/23 1045       Check-In   Supervising physician immediately available to respond to emergencies [facility] MD immediately available    Physician(s) Dr. Dellia Cloud    Location [facility]    Staff Present Leana Roe, BS, Exercise Physiologist;Samarra Ridgely BSN, RN    Virtual Visit No    Medication changes reported     No    Fall or balance concerns reported    Yes    Comments Patient says he loses his balance frequently.    Tobacco Cessation No Change    Warm-up and Cool-down Performed as group-led instruction    Resistance Training Performed Yes    VAD Patient? No    PAD/SET Patient? No      Pain Assessment   Currently in Pain? No/denies    Pain Score 0-No pain    Multiple Pain Sites No             Capillary Blood Glucose: No results found for this or any previous visit (from the past 24 hour(s)).    Social History   Tobacco Use  Smoking Status Never  Smokeless Tobacco Never    Goals Met:  Proper associated with RPD/PD & O2 Sat Independence with exercise equipment Using PLB without cueing & demonstrates good technique Exercise tolerated well Queuing for purse lip breathing No report of concerns or symptoms today Strength training completed today  Goals Unmet:  Not Applicable  Comments: check out at 11:45   Dr. Kathie Dike is Medical Director for [facility].

## 2023-02-18 ENCOUNTER — Encounter (HOSPITAL_COMMUNITY)
Admission: RE | Admit: 2023-02-18 | Discharge: 2023-02-18 | Disposition: A | Discharge status: Home / Self Care | Payer: PPO | Source: Ambulatory Visit | Attending: Internal Medicine | Admitting: Internal Medicine

## 2023-02-18 DIAGNOSIS — E1151 Type 2 diabetes mellitus with diabetic peripheral angiopathy without gangrene: Secondary | ICD-10-CM | POA: Diagnosis not present

## 2023-02-18 DIAGNOSIS — J9611 Chronic respiratory failure with hypoxia: Secondary | ICD-10-CM | POA: Diagnosis not present

## 2023-02-18 NOTE — Progress Notes (Signed)
Daily Session Note  Patient Details  Name: Tim Day MRN: [id number] Date of Birth: [birth year] Referring Provider:   Flowsheet Row PULMONARY REHAB OTHER RESP ORIENTATION from 10/18/2022 in [place]  Referring Provider Dr. Melvyn Novas       Encounter Date: 02/18/2023  Check In:  Session Check In - 02/18/23 1458       Check-In   Supervising physician immediately available to respond to emergencies [facility] MD immediately available    Physician(s) Dr. Dellia Cloud    Location [facility]    Staff Present Leana Roe, BS, Exercise Physiologist;Juandedios Dudash Hassell Done, RN, BSN;Dalton Sherrie George, MS, ACSM-CEP    Virtual Visit No    Medication changes reported     No    Fall or balance concerns reported    Yes    Comments Patient says he loses his balance frequently.    Tobacco Cessation No Change    Warm-up and Cool-down Performed as group-led instruction    Resistance Training Performed Yes    VAD Patient? No    PAD/SET Patient? No      Pain Assessment   Currently in Pain? No/denies    Pain Score 0-No pain    Multiple Pain Sites No             Capillary Blood Glucose: No results found for this or any previous visit (from the past 24 hour(s)).    Social History   Tobacco Use  Smoking Status Never  Smokeless Tobacco Never    Goals Met:  Proper associated with RPD/PD & O2 Sat Independence with exercise equipment Using PLB without cueing & demonstrates good technique Exercise tolerated well Queuing for purse lip breathing No report of concerns or symptoms today Strength training completed today  Goals Unmet:  Not Applicable  Comments: Checkout at 1600.   Dr. Kathie Dike is Medical Director for [facility].

## 2023-02-20 ENCOUNTER — Encounter (HOSPITAL_COMMUNITY)
Admission: RE | Admit: 2023-02-20 | Discharge: 2023-02-20 | Disposition: A | Discharge status: Home / Self Care | Payer: PPO | Source: Ambulatory Visit | Attending: Internal Medicine | Admitting: Internal Medicine

## 2023-02-20 DIAGNOSIS — J9611 Chronic respiratory failure with hypoxia: Secondary | ICD-10-CM

## 2023-02-20 NOTE — Progress Notes (Signed)
Daily Session Note  Patient Details  Name: Tim Day MRN: [id number] Date of Birth: 1951-01-28 Referring Provider:   Flowsheet Row PULMONARY REHAB OTHER RESP ORIENTATION from 10/18/2022 in [place]  Referring Provider Dr. Melvyn Novas       Encounter Date: 02/20/2023  Check In:  Session Check In - 02/20/23 1045       Check-In   Physician(s) Dr. Harl Bowie    [facility]    Staff Present Leana Roe, BS, Exercise Physiologist;Lasalle Abee BSN, RN    Virtual Visit No    Medication changes reported     No    Fall or balance concerns reported    Yes    Comments Patient says he loses his balance frequently.    Tobacco Cessation No Change    Warm-up and Cool-down Performed as group-led instruction    Resistance Training Performed Yes    VAD Patient? No    PAD/SET Patient? No      Pain Assessment   Currently in Pain? No/denies    Pain Score 0-No pain    Multiple Pain Sites No             Capillary Blood Glucose: No results found for this or any previous visit (from the past 24 hour(s)).    Social History   Tobacco Use  Smoking Status Never  Smokeless Tobacco Never    Goals Met:  Proper associated with RPD/PD & O2 Sat Independence with exercise equipment Using PLB without cueing & demonstrates good technique Exercise tolerated well Queuing for purse lip breathing No report of concerns or symptoms today Strength training completed today  Goals Unmet:  Not Applicable  Comments: check out at 11:45   Dr. Kathie Dike is Medical Director for [facility] Pulmonary Re[facility].

## 2023-02-25 ENCOUNTER — Encounter (HOSPITAL_COMMUNITY)
Admission: RE | Admit: 2023-02-25 | Discharge: 2023-02-25 | Disposition: A | Discharge status: Home / Self Care | Payer: PPO | Source: Ambulatory Visit | Attending: Internal Medicine | Admitting: Internal Medicine

## 2023-02-25 VITALS — Ht 70.0 in | Wt 175.9 lb

## 2023-02-25 DIAGNOSIS — J9611 Chronic respiratory failure with hypoxia: Secondary | ICD-10-CM

## 2023-02-25 NOTE — Progress Notes (Signed)
Daily Session Note  Patient Details  Name: Tim Day MRN: [id number] Date of Birth: December 20, 1950 Referring Provider:   [place] from 10/18/2022 in [place]  Referring Provider Dr. Melvyn Novas       Encounter Date: 02/25/2023  Check In:  Session Check In - 02/25/23 1045       Check-In   Supervising physician immediately available to respond to emergencies [facility] MD immediately available    Physician(s) Dr Domenic Polite    Location [facility]    Staff Present Leana Roe, BS, Exercise Physiologist;Ryian Lynde Hassell Done, RN, BSN;Dalton Sherrie George, MS, ACSM-CEP    Virtual Visit No    Medication changes reported     No    Fall or balance concerns reported    Yes    Comments Patient says he loses his balance frequently.    Tobacco Cessation No Change    Warm-up and Cool-down Performed as group-led instruction    Resistance Training Performed Yes    VAD Patient? No    PAD/SET Patient? No      Pain Assessment   Currently in Pain? No/denies    Pain Score 0-No pain    Multiple Pain Sites No             Capillary Blood Glucose: No results found for this or any previous visit (from the past 24 hour(s)).    Social History   Tobacco Use  Smoking Status Never  Smokeless Tobacco Never    Goals Met:  Proper associated with RPD/PD & O2 Sat Independence with exercise equipment Using PLB without cueing & demonstrates good technique Exercise tolerated well Queuing for purse lip breathing No report of concerns or symptoms today Strength training completed today  Goals Unmet:  Not Applicable  Comments: Checkout at 1145.   Dr. Kathie Dike is Medical Director for [facility].

## 2023-02-26 DIAGNOSIS — J9611 Chronic respiratory failure with hypoxia: Secondary | ICD-10-CM | POA: Diagnosis not present

## 2023-02-26 DIAGNOSIS — I5040 Unspecified combined systolic (congestive) and diastolic (congestive) heart failure: Secondary | ICD-10-CM | POA: Diagnosis not present

## 2023-02-26 DIAGNOSIS — J8410 Pulmonary fibrosis, unspecified: Secondary | ICD-10-CM | POA: Diagnosis not present

## 2023-03-04 NOTE — Progress Notes (Signed)
Discharge Progress Report  Patient Details  Name: Tim Day MRN: [id number] Date of Birth: 01-08-1951 Referring Provider:   [place] from 10/18/2022 in [place]  Referring Provider Dr. Melvyn Novas        Number of Visits: 33  Reason for Discharge:  Patient reached a stable level of exercise. Patient independent in their exercise. Patient has met program and personal goals.  Smoking History:  Social History   Tobacco Use  Smoking Status Never  Smokeless Tobacco Never    Diagnosis:  Chronic respiratory failure with hypoxia and hypercapnia (HCC)  ADL UCSD:  Pulmonary Assessment Scores     Row Name 10/18/22 1341 03/04/23 0814       ADL UCSD   ADL Phase Entry Exit    SOB Score total 58 85    Rest 1 5    Walk 2 4    Stairs 4 2    Bath 2 4    Dress 2 4    Shop 3 3      CAT Score   CAT Score 20 24      mMRC Score   mMRC Score 3 2             Initial Exercise Prescription:  Initial Exercise Prescription - 10/18/22 1400       Date of Initial Exercise RX and Referring Provider   Date 10/18/22    Referring Provider Dr. Melvyn Novas    Expected Discharge Date 02/25/23      Oxygen   Oxygen Continuous    Liters 6    Maintain Oxygen Saturation 88% or higher      NuStep   Level 1    SPM 60    Minutes 17      Arm Ergometer   Level 1    RPM 40    Minutes 22      Prescription Details   Frequency (times per week) 2    Duration Progress to 30 minutes of continuous aerobic without signs/symptoms of physical distress      Intensity   THRR 40-80% of Max Heartrate 60-121    Ratings of Perceived Exertion 11-13    Perceived Dyspnea 0-4      Resistance Training   Training Prescription Yes    Weight 2    Reps 10-15             Discharge Exercise Prescription (Final Exercise Prescription Changes):  Exercise Prescription Changes - 02/06/23 1200       Response to Exercise   Blood  Pressure (Admit) 110/60    Blood Pressure (Exercise) 120/60    Blood Pressure (Exit) 112/70    Heart Rate (Admit) 67 bpm    Heart Rate (Exercise) 97 bpm    Heart Rate (Exit) 78 bpm    Oxygen Saturation (Admit) 96 %    Oxygen Saturation (Exercise) 95 %    Oxygen Saturation (Exit) 95 %    Rating of Perceived Exertion (Exercise) 12    Perceived Dyspnea (Exercise) 12    Duration Continue with 30 min of aerobic exercise without signs/symptoms of physical distress.    Intensity THRR unchanged      Progression   Progression Continue to progress workloads to maintain intensity without signs/symptoms of physical distress.      Resistance Training   Training Prescription Yes    Weight 5    Reps 10-15    Time 10 Minutes  Oxygen   Oxygen Continuous    Liters 8      NuStep   Level 2    SPM 99    Minutes 17    METs 2.4      Arm Ergometer   Level 2    RPM 52    Minutes 22    METs 2             Functional Capacity:  6 Minute Walk     Row Name 10/18/22 1409 02/26/23 1257       6 Minute Walk   Phase Initial Discharge    Distance 700 feet 850 feet    Walk Time 6 minutes 6 minutes    # of Rest Breaks 2 1    MPH 1.32 1.6    METS 2.18 2.47    RPE 12 12    Perceived Dyspnea  13 13    VO2 Peak -- 8.66    Symptoms Yes (comment) Yes (comment)    Comments Two standing breaks to due to SOB One standing breal due to SOB    Resting HR 93 bpm 88 bpm    Resting BP 90/50 116/60    Resting Oxygen Saturation  96 % 93 %    Exercise Oxygen Saturation  during 6 min walk 87 % 89 %    Max Ex. HR 122 bpm 114 bpm    Max Ex. BP 118/58 128/60    2 Minute Post BP 106/58 110/60      Interval HR   1 Minute HR 110 106    2 Minute HR 118 113    3 Minute HR 120 112    4 Minute HR 113 111    5 Minute HR 113 104    6 Minute HR 122 114    2 Minute Post HR 102 97    Interval Heart Rate? Yes Yes      Interval Oxygen   Interval Oxygen? Yes Yes    Baseline Oxygen Saturation % 96 % 93 %     1 Minute Oxygen Saturation % 90 % 96 %    1 Minute Liters of Oxygen 4 L 6 L    2 Minute Oxygen Saturation % 97 % 96 %    2 Minute Liters of Oxygen 4 L 6 L    3 Minute Oxygen Saturation % 97 % 94 %    3 Minute Liters of Oxygen 6 L 6 L    4 Minute Oxygen Saturation % 90 % 89 %    4 Minute Liters of Oxygen 6 L 6 L    5 Minute Oxygen Saturation % 90 % 94 %    5 Minute Liters of Oxygen 6 L 8 L    6 Minute Oxygen Saturation % 87 % 94 %    6 Minute Liters of Oxygen 6 L 8 L    2 Minute Post Oxygen Saturation % 94 % 92 %    2 Minute Post Liters of Oxygen 6 L 6 L             Psychological, QOL, Others - Outcomes: PHQ 2/9:    03/04/2023    8:16 AM 10/18/2022    1:33 PM  Depression screen PHQ 2/9  Decreased Interest 2 1  Down, Depressed, Hopeless 2 1  PHQ - 2 Score 4 2  Altered sleeping 2 2  Tired, decreased energy 2 1  Change in appetite 2 0  Feeling bad or  failure about yourself  2 1  Trouble concentrating 0 0  Moving slowly or fidgety/restless 0 0  Suicidal thoughts 0 0  PHQ-9 Score 12 6  Difficult doing work/chores Not difficult at all Somewhat difficult    Quality of Life:  Quality of Life - 02/26/23 1300       Quality of Life Scores   Health/Function Pre 16.8 %    Health/Function Post 23.41 %    Health/Function % Change 39.35 %    Socioeconomic Pre 29.14 %    Socioeconomic Post 26 %    Socioeconomic % Change  -10.78 %    Psych/Spiritual Pre 18 %    Psych/Spiritual Post 25.71 %    Psych/Spiritual % Change 42.83 %    Family Pre 30 %    Family Post 28.8 %    Family % Change -4 %    GLOBAL Pre 21.53 %    GLOBAL Post 25.13 %    GLOBAL % Change 16.72 %             Personal Goals: Goals established at orientation with interventions provided to work toward goal.  Personal Goals and Risk Factors at Admission - 10/18/22 1336       Core Components/Risk Factors/Patient Goals on Admission    Weight Management Weight Maintenance    Improve shortness of breath  with ADL's Yes    Intervention Provide education, individualized exercise plan and daily activity instruction to help decrease symptoms of SOB with activities of daily living.    Expected Outcomes Short Term: Improve cardiorespiratory fitness to achieve a reduction of symptoms when performing ADLs;Long Term: Be able to perform more ADLs without symptoms or delay the onset of symptoms    Diabetes Yes    Intervention Provide education about signs/symptoms and action to take for hypo/hyperglycemia.;Provide education about proper nutrition, including hydration, and aerobic/resistive exercise prescription along with prescribed medications to achieve blood glucose in normal ranges: Fasting glucose 65-99 mg/dL    Expected Outcomes Short Term: Participant verbalizes understanding of the signs/symptoms and immediate care of hyper/hypoglycemia, proper foot care and importance of medication, aerobic/resistive exercise and nutrition plan for blood glucose control.    Personal Goal Other Yes    Personal Goal Patient wants to be able to do normal tasks around the house and be able to exercise. He wants to improve his SOB.    Intervention Patient will attend PR 2 days/week with exercise and education.    Expected Outcomes Patient will complete the program meeting both personal and program goals.              Personal Goals Discharge:  Goals and Risk Factor Review     Row Name 10/29/22 1111 11/22/22 0750 12/25/22 1318 01/23/23 0847 03/04/23 0834     Core Components/Risk Factors/Patient Goals Review   Personal Goals Review Develop more efficient breathing techniques such as purse lipped breathing and diaphragmatic breathing and practicing self-pacing with activity.;Increase knowledge of respiratory medications and ability to use respiratory devices properly.;Diabetes;Improve shortness of breath with ADL's Develop more efficient breathing techniques such as purse lipped breathing and diaphragmatic breathing and  practicing self-pacing with activity.;Increase knowledge of respiratory medications and ability to use respiratory devices properly.;Diabetes;Improve shortness of breath with ADL's Weight Management/Obesity;Improve shortness of breath with ADL's;Diabetes;Other;Develop more efficient breathing techniques such as purse lipped breathing and diaphragmatic breathing and practicing self-pacing with activity. Weight Management/Obesity;Improve shortness of breath with ADL's;Other;Develop more efficient breathing techniques such as purse lipped breathing and  diaphragmatic breathing and practicing self-pacing with activity.;Diabetes Weight Management/Obesity;Improve shortness of breath with ADL's;Other;Develop more efficient breathing techniques such as purse lipped breathing and diaphragmatic breathing and practicing self-pacing with activity.;Diabetes   Review To begin program 10/29/2022.  Pt is on Oxygen at 1-2 liters at rest and 5-6 liters with exercise at home.  Has suffered with long COVID since 07/2020, requiring oxygen. To begin program 10/29/2022.  Has suffered with long COVID since 07/2020, requiring oxygen.  Tolerating PR using Nu-Step and AE.  Vital signs are stable. Patient has completed 16 sessions. His current weight is 82.3 kg up 1.1 kg from last 30 day review. He is doing well in the program with consistent attendance and progressions. His blood pressure is at goal. He is exercising on 6 to 8 L of O2 via [place] with O2 Sats at 91 to 95%. His personal goals for the program are to be able to do normal activities around the house; exercise; and improve SOB. We will continue to monitor his progress as he works toward meeting these goals. Pt has completed 24 sessions.  His current weight is 83.1 kg, which is slightly less than his initial weight of 84 kg.  He is doing well in the program with conistant attendance and progressions.  His blood pressure is at goal.  He is exercising on 6-8 L 02 via [place] with 02 sats from  90-98%.  His personal goals for the program are to be able to do normal activities around the house, as well as be able to exercise and improve his SOB.  We will continue to monitor his progress as he works toward meeting these goals. Pt graduated from [facility] after 33 sessions. His weight was stable while he was in the program. His attendance was consistent throughout the program. His blood pressure and HR were WNLs. He continues to exercise at 6-8 L O2 via . Pt was able to improve his walk test distance by 21.43% showing that he has improved his exercise tolerance. He reports that his SOB is about the same as when he started the program.   Expected Outcomes Plan of care is ongoing.  No further concerns at this time. Plan of care is ongoing.  No further concerns at this time. Patient will complete the program and meet both personal and program goals. Patient will complete the program and meet both personal and program goals. Pt will continue to work towards their goals post discharge.            Exercise Goals and Review:  Exercise Goals     Row Name 10/18/22 1416 11/05/22 1315 12/03/22 1007 12/31/22 1143 01/28/23 1257     Exercise Goals   Increase Physical Activity Yes Yes Yes Yes Yes   Intervention Provide advice, education, support and counseling about physical activity/exercise needs.;Develop an individualized exercise prescription for aerobic and resistive training based on initial evaluation findings, risk stratification, comorbidities and participant's personal goals. Provide advice, education, support and counseling about physical activity/exercise needs.;Develop an individualized exercise prescription for aerobic and resistive training based on initial evaluation findings, risk stratification, comorbidities and participant's personal goals. Provide advice, education, support and counseling about physical activity/exercise needs.;Develop an individualized exercise prescription for aerobic and  resistive training based on initial evaluation findings, risk stratification, comorbidities and participant's personal goals. Provide advice, education, support and counseling about physical activity/exercise needs.;Develop an individualized exercise prescription for aerobic and resistive training based on initial evaluation findings, risk stratification, comorbidities and participant's personal goals.  Provide advice, education, support and counseling about physical activity/exercise needs.;Develop an individualized exercise prescription for aerobic and resistive training based on initial evaluation findings, risk stratification, comorbidities and participant's personal goals.   Expected Outcomes Short Term: Attend rehab on a regular basis to increase amount of physical activity.;Long Term: Exercising regularly at least 3-5 days a week.;Long Term: Add in home exercise to make exercise part of routine and to increase amount of physical activity. Short Term: Attend rehab on a regular basis to increase amount of physical activity.;Long Term: Exercising regularly at least 3-5 days a week.;Long Term: Add in home exercise to make exercise part of routine and to increase amount of physical activity. Short Term: Attend rehab on a regular basis to increase amount of physical activity.;Long Term: Exercising regularly at least 3-5 days a week.;Long Term: Add in home exercise to make exercise part of routine and to increase amount of physical activity. Short Term: Attend rehab on a regular basis to increase amount of physical activity.;Long Term: Exercising regularly at least 3-5 days a week.;Long Term: Add in home exercise to make exercise part of routine and to increase amount of physical activity. Short Term: Attend rehab on a regular basis to increase amount of physical activity.;Long Term: Exercising regularly at least 3-5 days a week.;Long Term: Add in home exercise to make exercise part of routine and to increase amount  of physical activity.   Increase Strength and Stamina Yes Yes Yes Yes Yes   Intervention Provide advice, education, support and counseling about physical activity/exercise needs.;Develop an individualized exercise prescription for aerobic and resistive training based on initial evaluation findings, risk stratification, comorbidities and participant's personal goals. Provide advice, education, support and counseling about physical activity/exercise needs.;Develop an individualized exercise prescription for aerobic and resistive training based on initial evaluation findings, risk stratification, comorbidities and participant's personal goals. Provide advice, education, support and counseling about physical activity/exercise needs.;Develop an individualized exercise prescription for aerobic and resistive training based on initial evaluation findings, risk stratification, comorbidities and participant's personal goals. Provide advice, education, support and counseling about physical activity/exercise needs.;Develop an individualized exercise prescription for aerobic and resistive training based on initial evaluation findings, risk stratification, comorbidities and participant's personal goals. Provide advice, education, support and counseling about physical activity/exercise needs.;Develop an individualized exercise prescription for aerobic and resistive training based on initial evaluation findings, risk stratification, comorbidities and participant's personal goals.   Expected Outcomes Short Term: Increase workloads from initial exercise prescription for resistance, speed, and METs.;Short Term: Perform resistance training exercises routinely during rehab and add in resistance training at home;Long Term: Improve cardiorespiratory fitness, muscular endurance and strength as measured by increased METs and functional capacity (6MWT) Short Term: Increase workloads from initial exercise prescription for resistance,  speed, and METs.;Short Term: Perform resistance training exercises routinely during rehab and add in resistance training at home;Long Term: Improve cardiorespiratory fitness, muscular endurance and strength as measured by increased METs and functional capacity (6MWT) Short Term: Increase workloads from initial exercise prescription for resistance, speed, and METs.;Short Term: Perform resistance training exercises routinely during rehab and add in resistance training at home;Long Term: Improve cardiorespiratory fitness, muscular endurance and strength as measured by increased METs and functional capacity (6MWT) Short Term: Increase workloads from initial exercise prescription for resistance, speed, and METs.;Short Term: Perform resistance training exercises routinely during rehab and add in resistance training at home;Long Term: Improve cardiorespiratory fitness, muscular endurance and strength as measured by increased METs and functional capacity (6MWT) Short Term: Increase workloads from  initial exercise prescription for resistance, speed, and METs.;Short Term: Perform resistance training exercises routinely during rehab and add in resistance training at home;Long Term: Improve cardiorespiratory fitness, muscular endurance and strength as measured by increased METs and functional capacity (6MWT)   Able to understand and use rate of perceived exertion (RPE) scale Yes Yes Yes Yes Yes   Intervention Provide education and explanation on how to use RPE scale Provide education and explanation on how to use RPE scale Provide education and explanation on how to use RPE scale Provide education and explanation on how to use RPE scale Provide education and explanation on how to use RPE scale   Expected Outcomes Short Term: Able to use RPE daily in rehab to express subjective intensity level;Long Term:  Able to use RPE to guide intensity level when exercising independently Short Term: Able to use RPE daily in rehab to  express subjective intensity level;Long Term:  Able to use RPE to guide intensity level when exercising independently Short Term: Able to use RPE daily in rehab to express subjective intensity level;Long Term:  Able to use RPE to guide intensity level when exercising independently Short Term: Able to use RPE daily in rehab to express subjective intensity level;Long Term:  Able to use RPE to guide intensity level when exercising independently Short Term: Able to use RPE daily in rehab to express subjective intensity level;Long Term:  Able to use RPE to guide intensity level when exercising independently   Able to understand and use Dyspnea scale Yes Yes Yes Yes Yes   Intervention Provide education and explanation on how to use Dyspnea scale Provide education and explanation on how to use Dyspnea scale Provide education and explanation on how to use Dyspnea scale Provide education and explanation on how to use Dyspnea scale Provide education and explanation on how to use Dyspnea scale   Expected Outcomes Short Term: Able to use Dyspnea scale daily in rehab to express subjective sense of shortness of breath during exertion;Long Term: Able to use Dyspnea scale to guide intensity level when exercising independently Short Term: Able to use Dyspnea scale daily in rehab to express subjective sense of shortness of breath during exertion;Long Term: Able to use Dyspnea scale to guide intensity level when exercising independently Short Term: Able to use Dyspnea scale daily in rehab to express subjective sense of shortness of breath during exertion;Long Term: Able to use Dyspnea scale to guide intensity level when exercising independently Short Term: Able to use Dyspnea scale daily in rehab to express subjective sense of shortness of breath during exertion;Long Term: Able to use Dyspnea scale to guide intensity level when exercising independently Short Term: Able to use Dyspnea scale daily in rehab to express subjective sense  of shortness of breath during exertion;Long Term: Able to use Dyspnea scale to guide intensity level when exercising independently   Knowledge and understanding of Target Heart Rate Range (THRR) Yes Yes Yes Yes Yes   Intervention Provide education and explanation of THRR including how the numbers were predicted and where they are located for reference Provide education and explanation of THRR including how the numbers were predicted and where they are located for reference Provide education and explanation of THRR including how the numbers were predicted and where they are located for reference Provide education and explanation of THRR including how the numbers were predicted and where they are located for reference Provide education and explanation of THRR including how the numbers were predicted and where they are located  for reference   Expected Outcomes Long Term: Able to use THRR to govern intensity when exercising independently;Short Term: Able to use daily as guideline for intensity in rehab;Short Term: Able to state/look up THRR Long Term: Able to use THRR to govern intensity when exercising independently;Short Term: Able to use daily as guideline for intensity in rehab;Short Term: Able to state/look up THRR Long Term: Able to use THRR to govern intensity when exercising independently;Short Term: Able to use daily as guideline for intensity in rehab;Short Term: Able to state/look up THRR Long Term: Able to use THRR to govern intensity when exercising independently;Short Term: Able to use daily as guideline for intensity in rehab;Short Term: Able to state/look up THRR Long Term: Able to use THRR to govern intensity when exercising independently;Short Term: Able to use daily as guideline for intensity in rehab;Short Term: Able to state/look up THRR   Understanding of Exercise Prescription Yes Yes Yes Yes Yes   Intervention Provide education, explanation, and written materials on patient's individual  exercise prescription Provide education, explanation, and written materials on patient's individual exercise prescription Provide education, explanation, and written materials on patient's individual exercise prescription Provide education, explanation, and written materials on patient's individual exercise prescription Provide education, explanation, and written materials on patient's individual exercise prescription   Expected Outcomes Short Term: Able to explain program exercise prescription;Long Term: Able to explain home exercise prescription to exercise independently Short Term: Able to explain program exercise prescription;Long Term: Able to explain home exercise prescription to exercise independently Short Term: Able to explain program exercise prescription;Long Term: Able to explain home exercise prescription to exercise independently Short Term: Able to explain program exercise prescription;Long Term: Able to explain home exercise prescription to exercise independently Short Term: Able to explain program exercise prescription;Long Term: Able to explain home exercise prescription to exercise independently            Exercise Goals Re-Evaluation:  Exercise Goals Re-Evaluation     Row Name 11/05/22 1315 12/03/22 1008 12/31/22 1143 01/28/23 1258 02/25/23 1429     Exercise Goal Re-Evaluation   Exercise Goals Review Increase Strength and Stamina;Increase Physical Activity;Able to understand and use rate of perceived exertion (RPE) scale;Able to understand and use Dyspnea scale;Knowledge and understanding of Target Heart Rate Range (THRR);Understanding of Exercise Prescription Increase Physical Activity;Increase Strength and Stamina;Able to understand and use Dyspnea scale;Able to understand and use rate of perceived exertion (RPE) scale;Knowledge and understanding of Target Heart Rate Range (THRR);Understanding of Exercise Prescription Increase Physical Activity;Increase Strength and Stamina;Able  to understand and use Dyspnea scale;Able to understand and use rate of perceived exertion (RPE) scale;Knowledge and understanding of Target Heart Rate Range (THRR);Understanding of Exercise Prescription Increase Physical Activity;Increase Strength and Stamina;Able to understand and use rate of perceived exertion (RPE) scale;Able to understand and use Dyspnea scale;Knowledge and understanding of Target Heart Rate Range (THRR);Understanding of Exercise Prescription --   Comments Pt has completed 3 sessions of PR. He is progressing his level on the stepper after 3 classes. He is motivated during class and enjoys coming. He is currently exercising at 2.1 METs on the stepper. Will continue to monitor and progress as able. Pt has completed 10 sessions of PR. He continues to progress during each session increasing his level. He is motivated and enjoys coming to class. He is currently exercising at home on his own by walking and using his own equipment/weights. He is currently exercising at 2.3 METs on the stepper. Will continue to monitor and progress as  able. Pt has completed 18 sessions of PR. He continues to progress during the sessions by increasing his SPM and RPM on each equipment. He enjoys coming to class. He is currently exercising at 2.4 METs on the stepper. He continues to exercise at home on his off days. Will continue to monitor and progress as able. Pt has completed 25 sessions of pR. He tolerates exercise well and enjoys coming to class. He recently has been SOB more during exercise with lower oxygen stats during exercise needing to increase oxygen to 8L instead of 6L. He does not go back to pulm. until May. He is currently exercising at 2.0 METs on the stepepr. Will continue to monitor and progess as able. --   Expected Outcomes Through exercise at home and at rehab, the patient will meet their stated goals. Through exercise at home and at rehab, the patient will meet their stated goals. Through exercise  at home and at rehab, the patient will meet their stated goals. Through exercise at home and at rehab, the patient will meet their stated goals. Through exercise at home and at rehab, the patient will meet their stated goals.            Nutrition & Weight - Outcomes:  Pre Biometrics - 10/18/22 1416       Pre Biometrics   Height 5\' 10"  (1.778 m)    Weight 177 lb 11.1 oz (80.6 kg)    Waist Circumference 38 inches    Hip Circumference 37 inches    Waist to Hip Ratio 1.03 %    BMI (Calculated) 25.5    Triceps Skinfold 10 mm    Grip Strength 23.4 kg    Flexibility 0 in    Single Leg Stand 0 seconds             Post Biometrics - 02/26/23 1300        Post  Biometrics   Height 5\' 10"  (1.778 m)    Weight 175 lb 14.8 oz (79.8 kg)    Waist Circumference 37 inches    Hip Circumference 38 inches    Waist to Hip Ratio 0.97 %    BMI (Calculated) 25.24    Triceps Skinfold 5 mm    % Body Fat 20.7 %    Grip Strength 27.3 kg    Flexibility 0 in    Single Leg Stand 0 seconds             Nutrition:  Nutrition Therapy & Goals - 10/18/22 1335       Personal Nutrition Goals   Comments Patient scored 88 on his diet assessment. Handout provided and explained to he and his wife regarding healthy nutrition. We offer 2 educational sessions on heart healthy nutrition with handouts and assistance with RD referral if patient is interested.      Intervention Plan   Intervention Nutrition handout(s) given to patient.    Expected Outcomes Short Term Goal: Understand basic principles of dietary content, such as calories, fat, sodium, cholesterol and nutrients.             Nutrition Discharge:  Nutrition Assessments - 03/04/23 0819       MEDFICTS Scores   Pre Score 88    Post Score 59    Score Difference -29             Education Questionnaire Score:  Knowledge Questionnaire Score - 03/04/23 0813       Knowledge Questionnaire Score   Pre  Score 14/18    Post Score  16/18             Goals reviewed with patient; copy given to patient. Pt graduated from [place] after 33 sessions. He was able to improve his walk test distance by 21.43%, and his MET level at discharge was 2.3. He has exercise equipment at his house, and he will continue to exercise at home.

## 2023-03-06 DIAGNOSIS — R972 Elevated prostate specific antigen [PSA]: Secondary | ICD-10-CM | POA: Diagnosis not present

## 2023-03-06 DIAGNOSIS — E118 Type 2 diabetes mellitus with unspecified complications: Secondary | ICD-10-CM | POA: Diagnosis not present

## 2023-03-06 DIAGNOSIS — E782 Mixed hyperlipidemia: Secondary | ICD-10-CM | POA: Diagnosis not present

## 2023-03-07 ENCOUNTER — Other Ambulatory Visit: Payer: Self-pay | Admitting: Internal Medicine

## 2023-03-07 LAB — LAB REPORT - SCANNED
A1c: 7.1
Albumin, Urine POC: 37.2
Creatinine, POC: 67.8 mg/dL
EGFR: 81

## 2023-03-12 ENCOUNTER — Telehealth: Payer: Self-pay | Admitting: *Deleted

## 2023-03-12 DIAGNOSIS — I10 Essential (primary) hypertension: Secondary | ICD-10-CM | POA: Diagnosis not present

## 2023-03-12 DIAGNOSIS — L989 Disorder of the skin and subcutaneous tissue, unspecified: Secondary | ICD-10-CM | POA: Diagnosis not present

## 2023-03-12 DIAGNOSIS — I5042 Chronic combined systolic (congestive) and diastolic (congestive) heart failure: Secondary | ICD-10-CM | POA: Diagnosis not present

## 2023-03-12 DIAGNOSIS — J9611 Chronic respiratory failure with hypoxia: Secondary | ICD-10-CM | POA: Diagnosis not present

## 2023-03-12 DIAGNOSIS — M21371 Foot drop, right foot: Secondary | ICD-10-CM | POA: Diagnosis not present

## 2023-03-12 DIAGNOSIS — M7989 Other specified soft tissue disorders: Secondary | ICD-10-CM | POA: Diagnosis not present

## 2023-03-12 DIAGNOSIS — J8410 Pulmonary fibrosis, unspecified: Secondary | ICD-10-CM | POA: Diagnosis not present

## 2023-03-12 DIAGNOSIS — I5040 Unspecified combined systolic (congestive) and diastolic (congestive) heart failure: Secondary | ICD-10-CM | POA: Diagnosis not present

## 2023-03-12 DIAGNOSIS — M542 Cervicalgia: Secondary | ICD-10-CM | POA: Diagnosis not present

## 2023-03-12 DIAGNOSIS — F418 Other specified anxiety disorders: Secondary | ICD-10-CM | POA: Diagnosis not present

## 2023-03-12 DIAGNOSIS — E1169 Type 2 diabetes mellitus with other specified complication: Secondary | ICD-10-CM | POA: Diagnosis not present

## 2023-03-12 DIAGNOSIS — I110 Hypertensive heart disease with heart failure: Secondary | ICD-10-CM | POA: Diagnosis not present

## 2023-03-12 DIAGNOSIS — R809 Proteinuria, unspecified: Secondary | ICD-10-CM | POA: Diagnosis not present

## 2023-03-12 DIAGNOSIS — E782 Mixed hyperlipidemia: Secondary | ICD-10-CM | POA: Diagnosis not present

## 2023-03-12 NOTE — Telephone Encounter (Unsigned)
Patient states that he has had to increase his oxygen concentrator to 4.5 at night for the past 3 weeks and would like some advice on what to do next. States conccentrator is maxed out at 5 and continuously beeps at night. Please call and avdise patient [phone]

## 2023-03-12 NOTE — Telephone Encounter (Signed)
Called and spoke w/ pts wife she reports that in order for pt to keep saturations above 90% on bipap he is having to increase O2 to 4.5L @ night.   Wife states that pt has had a cold that PCP treated, denies any increased SOB during the day & is only needing 3L during the daytime.   Pulmonary rehab ended on 3/26 pt reports that during exercising he required 6-8 O2.   Dr.Wert please advise?

## 2023-03-12 NOTE — Telephone Encounter (Signed)
It's fine to adjust the 02 to keep > 90% - Dr Craige Cotta is scheduled to see him to fine tune the bipap component

## 2023-03-13 NOTE — Telephone Encounter (Signed)
Called and spoke w/ pt wife she verbalized understanding of Dr.Wert's message. Closing encounter

## 2023-03-28 DIAGNOSIS — J9611 Chronic respiratory failure with hypoxia: Secondary | ICD-10-CM | POA: Diagnosis not present

## 2023-03-28 DIAGNOSIS — I5040 Unspecified combined systolic (congestive) and diastolic (congestive) heart failure: Secondary | ICD-10-CM | POA: Diagnosis not present

## 2023-03-28 DIAGNOSIS — J8410 Pulmonary fibrosis, unspecified: Secondary | ICD-10-CM | POA: Diagnosis not present

## 2023-03-28 DIAGNOSIS — E261 Secondary hyperaldosteronism: Secondary | ICD-10-CM | POA: Diagnosis not present

## 2023-03-28 DIAGNOSIS — Z6825 Body mass index (BMI) 25.0-25.9, adult: Secondary | ICD-10-CM | POA: Diagnosis not present

## 2023-03-28 DIAGNOSIS — I5032 Chronic diastolic (congestive) heart failure: Secondary | ICD-10-CM | POA: Diagnosis not present

## 2023-03-28 DIAGNOSIS — Z9981 Dependence on supplemental oxygen: Secondary | ICD-10-CM | POA: Diagnosis not present

## 2023-04-02 ENCOUNTER — Ambulatory Visit: Payer: HMO | Admitting: Pulmonary Disease

## 2023-04-04 ENCOUNTER — Ambulatory Visit: Payer: HMO | Admitting: Pulmonary Disease

## 2023-04-07 ENCOUNTER — Ambulatory Visit (INDEPENDENT_AMBULATORY_CARE_PROVIDER_SITE_OTHER): Payer: PPO | Admitting: Psychiatry

## 2023-04-07 DIAGNOSIS — J9611 Chronic respiratory failure with hypoxia: Secondary | ICD-10-CM

## 2023-04-07 DIAGNOSIS — F4321 Adjustment disorder with depressed mood: Secondary | ICD-10-CM | POA: Diagnosis not present

## 2023-04-07 DIAGNOSIS — J9612 Chronic respiratory failure with hypercapnia: Secondary | ICD-10-CM | POA: Diagnosis not present

## 2023-04-07 DIAGNOSIS — F5104 Psychophysiologic insomnia: Secondary | ICD-10-CM | POA: Diagnosis not present
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
## 2023-04-11 DIAGNOSIS — J8410 Pulmonary fibrosis, unspecified: Secondary | ICD-10-CM | POA: Diagnosis not present

## 2023-04-11 DIAGNOSIS — I5040 Unspecified combined systolic (congestive) and diastolic (congestive) heart failure: Secondary | ICD-10-CM | POA: Diagnosis not present

## 2023-04-11 DIAGNOSIS — J9611 Chronic respiratory failure with hypoxia: Secondary | ICD-10-CM | POA: Diagnosis not present

## 2023-04-15 DIAGNOSIS — Z08 Encounter for follow-up examination after completed treatment for malignant neoplasm: Secondary | ICD-10-CM | POA: Diagnosis not present

## 2023-04-15 DIAGNOSIS — Z1283 Encounter for screening for malignant neoplasm of skin: Secondary | ICD-10-CM | POA: Diagnosis not present

## 2023-04-15 DIAGNOSIS — D1801 Hemangioma of skin and subcutaneous tissue: Secondary | ICD-10-CM | POA: Diagnosis not present

## 2023-04-15 DIAGNOSIS — D485 Neoplasm of uncertain behavior of skin: Secondary | ICD-10-CM | POA: Diagnosis not present

## 2023-04-15 DIAGNOSIS — L568 Other specified acute skin changes due to ultraviolet radiation: Secondary | ICD-10-CM | POA: Diagnosis not present

## 2023-04-15 DIAGNOSIS — Z85820 Personal history of malignant melanoma of skin: Secondary | ICD-10-CM | POA: Diagnosis not present

## 2023-04-15 DIAGNOSIS — L218 Other seborrheic dermatitis: Secondary | ICD-10-CM | POA: Diagnosis not present

## 2023-04-15 DIAGNOSIS — L821 Other seborrheic keratosis: Secondary | ICD-10-CM | POA: Diagnosis not present

## 2023-04-21 ENCOUNTER — Ambulatory Visit (HOSPITAL_BASED_OUTPATIENT_CLINIC_OR_DEPARTMENT_OTHER): Payer: PPO | Admitting: Pulmonary Disease

## 2023-04-21 ENCOUNTER — Ambulatory Visit (HOSPITAL_BASED_OUTPATIENT_CLINIC_OR_DEPARTMENT_OTHER): Payer: PPO

## 2023-04-21 ENCOUNTER — Encounter (HOSPITAL_BASED_OUTPATIENT_CLINIC_OR_DEPARTMENT_OTHER): Payer: Self-pay | Admitting: Pulmonary Disease

## 2023-04-21 VITALS — BP 106/68 | HR 87 | Temp 97.7°F | Ht 69.0 in | Wt 181.4 lb

## 2023-04-21 DIAGNOSIS — J9611 Chronic respiratory failure with hypoxia: Secondary | ICD-10-CM | POA: Diagnosis not present

## 2023-04-21 DIAGNOSIS — R918 Other nonspecific abnormal finding of lung field: Secondary | ICD-10-CM | POA: Diagnosis not present

## 2023-04-21 DIAGNOSIS — J849 Interstitial pulmonary disease, unspecified: Secondary | ICD-10-CM

## 2023-04-21 NOTE — Progress Notes (Signed)
[facility]  Chief Complaint  Patient presents with   Follow-up    Follow up.     Past Surgical History:  He  has a past surgical history that includes Shoulder surgery (2008); Ankle fracture surgery; Abdominal exploration surgery; Colonoscopy (07/13/2012); left heart catheterization with coronary angiogram (N/A, 01/20/2015); IR Perc Cholecystostomy (04/10/2021); IR Radiologist Eval & Mgmt (05/08/2021); IR EXCHANGE BILIARY DRAIN (05/16/2021); IR EXCHANGE BILIARY DRAIN (05/24/2021); IR REMOVAL OF CALCULI/DEBRIS BILIARY DUCT/GB (05/28/2021); IR EXCHANGE BILIARY DRAIN (05/28/2021); IR REMOVAL OF CALCULI/DEBRIS BILIARY DUCT/GB (06/25/2021); IR EXCHANGE BILIARY DRAIN (06/28/2021); IR Radiologist Eval & Mgmt (07/05/2021); IR Radiologist Eval & Mgmt (07/19/2021); IR CHOLANGIOGRAM EXISTING TUBE (08/30/2021); and IR Sinus/Fist Tube Chk-Non GI (08/20/2021).  Past Medical History:  Diastolic CHF, DM type 2, HLD, HTN, Chronic pain, GERD, Melanoma, Insomnia, DVT, COVID pneumonia September 2021, Lt pneumothorax September 2021  Constitutional:  BP 106/68 (BP Location: Left Arm, Patient Position: Sitting, Cuff Size: Normal)   Pulse 87   Temp 97.7 F (36.5 C) (Oral)   Ht 5\' 9"  (1.753 m)   Wt 181 lb 6.4 oz (82.3 kg)   SpO2 96%   BMI 26.79 kg/m   Brief Summary:  Tim Day is a 72 y.o. 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      Subjective:   He is here with his granddaughter.  He has been needing more oxygen at night.  Now using 4.5 liters with Bipap.  Says his concentrator will cut off if he tries turning it up to 5 liters.  Still using 3 liters during the day.  Has occasional cough with clear sputum.  No wheeze, fever, chest pain, or hemoptysis.  Has mild ankle swelling but no worse than usual.  He only gets about 4 to 5 hours sleep per day.  Physical Exam:   Appearance - well kempt, wearing oxygen  ENMT - no sinus  tenderness, no oral exudate, no LAN, Mallampati 3 airway, no stridor  Respiratory - faint b/l crackles  CV - s1s2 regular rate and rhythm, no murmurs  Ext - no clubbing, no edema  Skin - no rashes  Psych - normal mood and affect    Pulmonary testing:  PFT 03/07/21 >> FEV1 1.32 (39%), FEV1% 92, TLC 2.13 (30%), DLCO 43% ABG 05/07/22 >> pH 7.33, PCO2 84, PO2 63  Chest Imaging:  HRCT chest 04/24/22 >> nodular cluster in Lt lung base up to 1.5 cm, mod pulmonary fibrosis (post-COVID pattern)  Sleep Tests:  Auto Bipap 01/21/23 to 04/20/23 >> used on 89 of 90 nights with average 4 hrs 6 min.  Average AHI 0.1 with median Bipap 9/5 and 95 th percentile Bipap 10/6 cm H2O  Cardiac Tests:  Echo 05/07/22 >> EF 55 to 60%, mod RV enlargement, severe elevation in PASP  Social History:  He  reports that he has never smoked. He has never used smokeless tobacco. He reports that he does not drink alcohol and does not use drugs.  Family History:  His family history includes CAD in his father; Diabetes in his mother.     Assessment/Plan:   Chronic hypoxic/hypercapnic respiratory failure. - in setting of post-COVID pulmonary fibrosis - goal SpO2 > 90% - continue auto Bipap with max IPAP 20, min EPAP 5, PS 4 cm H2O - continue 3 liters during the day and 4.5 liters at night with Bipap for now - will arrange for ONO with auto Bipap and 4.5 liters; depending  on results will determine if he needs an in lab titration study or needs adjustment to his home oxygen set up  Pulmonary fibrosis, Lung nodules. - repeat chest xray today - follow up with Dr. Sherene Sires   Time Spent Involved in Patient Care on Day of Examination:  36 minutes  Follow up:   Patient Instructions  Chest xray today  Will arrange for overnight oxygen test with Bipap and 4.5 liters oxygen  Follow up in 4 months  Medication List:   Allergies as of 04/21/2023   No Known Allergies      Medication List        Accurate as of Apr 21, 2023 12:02 PM. If you have any questions, ask your nurse or doctor.          Accu-Chek Guide test strip Generic drug: glucose blood USE TO TEST TWICE DAILY.D   acetaminophen 500 MG tablet Commonly known as: TYLENOL Take 1,000 mg by mouth 2 (two) times daily as needed for moderate pain.   alclomethasone 0.05 % cream Commonly known as: ACLOVATE Apply topically 2 (two) times daily as needed (Rash).   ALPRAZolam 0.25 MG tablet Commonly known as: XANAX Take 0.25 mg by mouth daily as needed for anxiety.   apixaban 5 MG Tabs tablet Commonly known as: ELIQUIS Take 1 tablet (5 mg total) by mouth 2 (two) times daily.   ascorbic acid 500 MG tablet Commonly known as: VITAMIN C Take 1 tablet (500 mg total) by mouth daily.   blood glucose meter kit and supplies Kit Dispense based on patient and insurance preference. Use up to four times daily as directed. (FOR ICD-9 250.00, 250.01).   buPROPion 150 MG 24 hr tablet Commonly known as: WELLBUTRIN XL Take 150 mg by mouth in the morning.   CINNAMON PLUS CHROMIUM PO Take 1 tablet by mouth in the morning.   CO-ENZYME Q-10 PO Take 100 mg by mouth in the morning.   famotidine 20 MG tablet Commonly known as: PEPCID TAKE ONE TABLET BY MOUTH DAILY AFTER SUPPER   Fiasp FlexTouch 100 UNIT/ML FlexTouch Pen Generic drug: insulin aspart Inject 2-8 Units into the skin every evening.   furosemide 40 MG tablet Commonly known as: LASIX Take 1 tablet (40 mg total) by mouth daily.   ipratropium 0.06 % nasal spray Commonly known as: ATROVENT Place 2 sprays into both nostrils 4 (four) times daily. What changed:  when to take this reasons to take this   multivitamin with minerals Tabs tablet Take 1 tablet by mouth every evening.   OneTouch Delica Plus Lancet33G Misc Apply topically 2 (two) times daily.   pantoprazole 40 MG tablet Commonly known as: PROTONIX Take 40 mg by mouth at bedtime.   Pen Needles 32G X 4 MM Misc 1 Package  by Does not apply route 4 (four) times daily -  before meals and at bedtime.   potassium chloride SA 20 MEQ tablet Commonly known as: KLOR-CON M Take 1 tablet (20 mEq total) by mouth daily.   rosuvastatin 5 MG tablet Commonly known as: CRESTOR Take 5 mg by mouth at bedtime.   sertraline 100 MG tablet Commonly known as: ZOLOFT Take 100 mg by mouth in the morning.   Synjardy XR 12.04-999 MG Tb24 Generic drug: Empagliflozin-metFORMIN HCl ER Take 1 tablet by mouth 2 (two) times daily.   tamsulosin 0.4 MG Caps capsule Commonly known as: FLOMAX Take 0.4 mg by mouth at bedtime.   Toujeo Max SoloStar 300 UNIT/ML Solostar Pen  Generic drug: insulin glargine (2 Unit Dial) Inject 20 Units into the skin in the morning.   Vitamin D3 125 MCG (5000 UT) Caps Take 5,000 Units by mouth in the morning.   zinc sulfate 220 (50 Zn) MG capsule Take 1 capsule (220 mg total) by mouth daily. What changed: when to take this        Signature:  Coralyn Helling, MD [facility]monary/Critical Care Pager - [phone] 04/21/2023, 12:02 PM

## 2023-04-21 NOTE — Patient Instructions (Signed)
Chest xray today  Will arrange for overnight oxygen test with Bipap and 4.5 liters oxygen  Follow up in 4 months

## 2023-04-24 ENCOUNTER — Other Ambulatory Visit: Payer: Self-pay | Admitting: Cardiology

## 2023-04-29 DIAGNOSIS — J9611 Chronic respiratory failure with hypoxia: Secondary | ICD-10-CM | POA: Diagnosis not present

## 2023-04-29 DIAGNOSIS — I5040 Unspecified combined systolic (congestive) and diastolic (congestive) heart failure: Secondary | ICD-10-CM | POA: Diagnosis not present

## 2023-04-29 DIAGNOSIS — J8410 Pulmonary fibrosis, unspecified: Secondary | ICD-10-CM | POA: Diagnosis not present

## 2023-05-08 DIAGNOSIS — E1151 Type 2 diabetes mellitus with diabetic peripheral angiopathy without gangrene: Secondary | ICD-10-CM | POA: Diagnosis not present

## 2023-05-09 ENCOUNTER — Telehealth: Payer: Self-pay | Admitting: Pulmonary Disease

## 2023-05-09 NOTE — Telephone Encounter (Signed)
ONO with Bipap and 3 liters 05/06/23 >> test time 6 hrs 32 min.  Basal SpO2 91.8%, low SpO2 88%.  Please let him know his overnight oxygen test looked good.  He doesn't need any additional sleep studies at this time.

## 2023-05-12 DIAGNOSIS — J8410 Pulmonary fibrosis, unspecified: Secondary | ICD-10-CM | POA: Diagnosis not present

## 2023-05-12 DIAGNOSIS — I5040 Unspecified combined systolic (congestive) and diastolic (congestive) heart failure: Secondary | ICD-10-CM | POA: Diagnosis not present

## 2023-05-12 DIAGNOSIS — J9611 Chronic respiratory failure with hypoxia: Secondary | ICD-10-CM | POA: Diagnosis not present

## 2023-05-12 NOTE — Telephone Encounter (Signed)
Called and spoke w/ pt wife (ok per DPR) letting her know results of ONO - he verbalized understanding NFN att.

## 2023-05-29 DIAGNOSIS — I5040 Unspecified combined systolic (congestive) and diastolic (congestive) heart failure: Secondary | ICD-10-CM | POA: Diagnosis not present

## 2023-05-29 DIAGNOSIS — J8410 Pulmonary fibrosis, unspecified: Secondary | ICD-10-CM | POA: Diagnosis not present

## 2023-05-29 DIAGNOSIS — J9611 Chronic respiratory failure with hypoxia: Secondary | ICD-10-CM | POA: Diagnosis not present

## 2023-06-09 ENCOUNTER — Other Ambulatory Visit: Payer: Self-pay | Admitting: Cardiology

## 2023-06-24 ENCOUNTER — Ambulatory Visit (INDEPENDENT_AMBULATORY_CARE_PROVIDER_SITE_OTHER): Payer: Self-pay | Admitting: Psychiatry

## 2023-06-24 NOTE — Progress Notes (Signed)
Admin note for non-service contact  Patient ID: Tim Day  MRN: [id number] DATE: 06/24/2023  First seen 10 wks ago, long delay to follow up.  10am appt cancelled by TC @ 9:30am, with erroneous understanding that appt was actually 2pm.  RS'd to 9/23.  Discount NS/SNCA fee from $75 to $25 in respect of hardships and effort made to reschedule.  Robley Fries, PhD Marliss Czar, PhD LP Clinical Psychologist, [facility] [address], [address] [place], [place] [address] [phone]

## 2023-07-01 DIAGNOSIS — E119 Type 2 diabetes mellitus without complications: Secondary | ICD-10-CM | POA: Diagnosis not present

## 2023-07-04 DIAGNOSIS — N401 Enlarged prostate with lower urinary tract symptoms: Secondary | ICD-10-CM | POA: Diagnosis not present

## 2023-07-04 DIAGNOSIS — J8410 Pulmonary fibrosis, unspecified: Secondary | ICD-10-CM | POA: Diagnosis not present

## 2023-07-04 DIAGNOSIS — I5040 Unspecified combined systolic (congestive) and diastolic (congestive) heart failure: Secondary | ICD-10-CM | POA: Diagnosis not present

## 2023-07-04 DIAGNOSIS — J9611 Chronic respiratory failure with hypoxia: Secondary | ICD-10-CM | POA: Diagnosis not present

## 2023-07-04 DIAGNOSIS — E782 Mixed hyperlipidemia: Secondary | ICD-10-CM | POA: Diagnosis not present

## 2023-07-04 DIAGNOSIS — E1169 Type 2 diabetes mellitus with other specified complication: Secondary | ICD-10-CM | POA: Diagnosis not present

## 2023-07-10 DIAGNOSIS — M21371 Foot drop, right foot: Secondary | ICD-10-CM | POA: Diagnosis not present

## 2023-07-10 DIAGNOSIS — J9611 Chronic respiratory failure with hypoxia: Secondary | ICD-10-CM | POA: Diagnosis not present

## 2023-07-10 DIAGNOSIS — I5042 Chronic combined systolic (congestive) and diastolic (congestive) heart failure: Secondary | ICD-10-CM | POA: Diagnosis not present

## 2023-07-10 DIAGNOSIS — E782 Mixed hyperlipidemia: Secondary | ICD-10-CM | POA: Diagnosis not present

## 2023-07-10 DIAGNOSIS — R911 Solitary pulmonary nodule: Secondary | ICD-10-CM | POA: Diagnosis not present

## 2023-07-10 DIAGNOSIS — M542 Cervicalgia: Secondary | ICD-10-CM | POA: Diagnosis not present

## 2023-07-10 DIAGNOSIS — I10 Essential (primary) hypertension: Secondary | ICD-10-CM | POA: Diagnosis not present

## 2023-07-10 DIAGNOSIS — R809 Proteinuria, unspecified: Secondary | ICD-10-CM | POA: Diagnosis not present

## 2023-07-10 DIAGNOSIS — M7989 Other specified soft tissue disorders: Secondary | ICD-10-CM | POA: Diagnosis not present

## 2023-07-10 DIAGNOSIS — F418 Other specified anxiety disorders: Secondary | ICD-10-CM | POA: Diagnosis not present

## 2023-07-10 DIAGNOSIS — E1169 Type 2 diabetes mellitus with other specified complication: Secondary | ICD-10-CM | POA: Diagnosis not present

## 2023-07-10 DIAGNOSIS — I110 Hypertensive heart disease with heart failure: Secondary | ICD-10-CM | POA: Diagnosis not present

## 2023-07-16 DIAGNOSIS — J449 Chronic obstructive pulmonary disease, unspecified: Secondary | ICD-10-CM | POA: Diagnosis not present

## 2023-07-16 DIAGNOSIS — J9611 Chronic respiratory failure with hypoxia: Secondary | ICD-10-CM | POA: Diagnosis not present

## 2023-07-16 DIAGNOSIS — I5040 Unspecified combined systolic (congestive) and diastolic (congestive) heart failure: Secondary | ICD-10-CM | POA: Diagnosis not present

## 2023-07-16 DIAGNOSIS — J8410 Pulmonary fibrosis, unspecified: Secondary | ICD-10-CM | POA: Diagnosis not present

## 2023-07-17 DIAGNOSIS — E1151 Type 2 diabetes mellitus with diabetic peripheral angiopathy without gangrene: Secondary | ICD-10-CM | POA: Diagnosis not present

## 2023-08-13 DIAGNOSIS — J449 Chronic obstructive pulmonary disease, unspecified: Secondary | ICD-10-CM | POA: Diagnosis not present

## 2023-08-13 DIAGNOSIS — J9611 Chronic respiratory failure with hypoxia: Secondary | ICD-10-CM | POA: Diagnosis not present

## 2023-08-13 DIAGNOSIS — J8410 Pulmonary fibrosis, unspecified: Secondary | ICD-10-CM | POA: Diagnosis not present

## 2023-08-13 DIAGNOSIS — I5040 Unspecified combined systolic (congestive) and diastolic (congestive) heart failure: Secondary | ICD-10-CM | POA: Diagnosis not present

## 2023-08-16 DIAGNOSIS — I5040 Unspecified combined systolic (congestive) and diastolic (congestive) heart failure: Secondary | ICD-10-CM | POA: Diagnosis not present

## 2023-08-16 DIAGNOSIS — J8410 Pulmonary fibrosis, unspecified: Secondary | ICD-10-CM | POA: Diagnosis not present

## 2023-08-16 DIAGNOSIS — J449 Chronic obstructive pulmonary disease, unspecified: Secondary | ICD-10-CM | POA: Diagnosis not present

## 2023-08-16 DIAGNOSIS — J9611 Chronic respiratory failure with hypoxia: Secondary | ICD-10-CM | POA: Diagnosis not present

## 2023-08-20 ENCOUNTER — Encounter (HOSPITAL_BASED_OUTPATIENT_CLINIC_OR_DEPARTMENT_OTHER): Payer: Self-pay | Admitting: Pulmonary Disease

## 2023-08-20 ENCOUNTER — Ambulatory Visit (HOSPITAL_BASED_OUTPATIENT_CLINIC_OR_DEPARTMENT_OTHER): Payer: PPO | Admitting: Pulmonary Disease

## 2023-08-20 VITALS — BP 110/78 | HR 111 | Ht 69.0 in | Wt 181.0 lb

## 2023-08-20 DIAGNOSIS — J849 Interstitial pulmonary disease, unspecified: Secondary | ICD-10-CM | POA: Diagnosis not present

## 2023-08-20 DIAGNOSIS — J411 Mucopurulent chronic bronchitis: Secondary | ICD-10-CM | POA: Diagnosis not present

## 2023-08-20 DIAGNOSIS — J9611 Chronic respiratory failure with hypoxia: Secondary | ICD-10-CM

## 2023-08-20 DIAGNOSIS — J9612 Chronic respiratory failure with hypercapnia: Secondary | ICD-10-CM

## 2023-08-20 DIAGNOSIS — Z8616 Personal history of COVID-19: Secondary | ICD-10-CM

## 2023-08-20 MED ORDER — ALBUTEROL SULFATE HFA 108 (90 BASE) MCG/ACT IN AERS: 2.0000 | INHALATION_SPRAY | Freq: Four times a day (QID) | RESPIRATORY_TRACT | 5 refills | Status: DC | PRN

## 2023-08-20 MED ORDER — BUDESONIDE-FORMOTEROL FUMARATE 160-4.5 MCG/ACT IN AERO
2.0000 | INHALATION_SPRAY | Freq: Two times a day (BID) | RESPIRATORY_TRACT | 12 refills | Status: DC
Start: 2023-08-20 — End: 2023-09-15

## 2023-08-20 MED ORDER — IPRATROPIUM-ALBUTEROL 0.5-2.5 (3) MG/3ML IN SOLN
3.0000 mL | Freq: Four times a day (QID) | RESPIRATORY_TRACT | 3 refills | Status: DC | PRN
Start: 2023-08-20 — End: 2023-10-15

## 2023-08-20 NOTE — Progress Notes (Signed)
[facility]  Chief Complaint  Patient presents with   Medical Management of Chronic Issues    Past Surgical History:  He  has a past surgical history that includes Shoulder surgery (2008); Ankle fracture surgery; Abdominal exploration surgery; Colonoscopy (07/13/2012); left heart catheterization with coronary angiogram (N/A, 01/20/2015); IR Perc Cholecystostomy (04/10/2021); IR Radiologist Eval & Mgmt (05/08/2021); IR EXCHANGE BILIARY DRAIN (05/16/2021); IR EXCHANGE BILIARY DRAIN (05/24/2021); IR REMOVAL OF CALCULI/DEBRIS BILIARY DUCT/GB (05/28/2021); IR EXCHANGE BILIARY DRAIN (05/28/2021); IR REMOVAL OF CALCULI/DEBRIS BILIARY DUCT/GB (06/25/2021); IR EXCHANGE BILIARY DRAIN (06/28/2021); IR Radiologist Eval & Mgmt (07/05/2021); IR Radiologist Eval & Mgmt (07/19/2021); IR CHOLANGIOGRAM EXISTING TUBE (08/30/2021); and IR Sinus/Fist Tube Chk-Non GI (08/20/2021).  Past Medical History:  Diastolic CHF, DM type 2, HLD, HTN, Chronic pain, GERD, Melanoma, Insomnia, DVT, COVID pneumonia September 2021, Lt pneumothorax September 2021  Constitutional:  BP 110/78   Pulse (!) 111   Ht 5\' 9"  (1.753 m)   Wt 181 lb (82.1 kg)   SpO2 90% Comment: 6L  BMI 26.73 kg/m   Brief Summary:  Tim Day is a 72 y.o. 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      Subjective:   He is here with his granddaughter.  Has more cough with sputum.  Gets winded more easily.  Has some leg swelling.  Lasix increased recently.  Using 3 to 5 liters oxygen.  Uses Bipap at night.  Not having chest pain, fever, or hemoptysis.  Physical Exam:   Appearance - well kempt, wearing oxygen  ENMT - no sinus tenderness, no oral exudate, no LAN, Mallampati 3 airway, no stridor  Respiratory - faint b/l crackles  CV - s1s2 regular rate and rhythm, no murmurs  Ext - no clubbing, no edema  Skin - no rashes  Psych - normal mood and affect    Pulmonary  testing:  PFT 03/07/21 >> FEV1 1.32 (39%), FEV1% 92, TLC 2.13 (30%), DLCO 43% ABG 05/07/22 >> pH 7.33, PCO2 84, PO2 63  Chest Imaging:  HRCT chest 04/24/22 >> nodular cluster in Lt lung base up to 1.5 cm, mod pulmonary fibrosis (post-COVID pattern)  Sleep Tests:  Auto Bipap 05/22/23 to 08/19/23 >> used on 86 of 90 nights with average 5 hrs 14 min.  Average AHI 0.1 with median Bipap 9/5 and 95 th percentile Bipap 11/7 cm H2O  Cardiac Tests:  Echo 09/19/22 >> EF 50 to 55%, mild/mod MR  Social History:  He  reports that he has never smoked. He has never used smokeless tobacco. He reports that he does not drink alcohol and does not use drugs.  Family History:  His family history includes CAD in his father; Diabetes in his mother.     Assessment/Plan:   Chronic hypoxic/hypercapnic respiratory failure. - in setting of post-COVID pulmonary fibrosis, chronic bronchitis - goal SpO2 > 90% - continue auto Bipap with max IPAP 20, min EPAP 5, PS 4 cm H2O - he uses 3 to 5 liters oxygen 24/7 - add symbicort 160 two puffs bid - prn albuterol or duoneb - will arrange for repeat HRCT chest - high dose flu shot today   Time Spent Involved in Patient Care on Day of Examination:  38 minutes  Follow up:   Patient Instructions  [facility] schedule [facility] at [facility]  Symbicort two puffs in the morning and in the evening, and rinse your mouth after each use  Albuterol two puffs every 6  hours as needed for cough, wheeze, chest congestion, or shortness of breath  Ipratropium/albuterol one vial nebulized every 6 hours as needed for cough, wheeze, chest congestion, or shortness of breath  High dose flu shot today  Follow up in 2 months  Medication List:   Allergies as of 08/20/2023   No Known Allergies      Medication List        Accurate as of August 20, 2023  2:35 PM. If you have any questions, ask your nurse or doctor.          Accu-Chek Guide test strip Generic drug: glucose  blood USE TO TEST TWICE DAILY.D   acetaminophen 500 MG tablet Commonly known as: TYLENOL Take 1,000 mg by mouth 2 (two) times daily as needed for moderate pain.   albuterol 108 (90 Base) MCG/ACT inhaler Commonly known as: Ventolin HFA Inhale 2 puffs into the lungs every 6 (six) hours as needed for wheezing or shortness of breath. Started by: Coralyn Helling   alclomethasone 0.05 % cream Commonly known as: ACLOVATE Apply topically 2 (two) times daily as needed (Rash).   ALPRAZolam 0.25 MG tablet Commonly known as: XANAX Take 0.25 mg by mouth daily as needed for anxiety.   apixaban 5 MG Tabs tablet Commonly known as: ELIQUIS Take 1 tablet (5 mg total) by mouth 2 (two) times daily.   ascorbic acid 500 MG tablet Commonly known as: VITAMIN C Take 1 tablet (500 mg total) by mouth daily.   blood glucose meter kit and supplies Kit Dispense based on patient and insurance preference. Use up to four times daily as directed. (FOR ICD-9 250.00, 250.01).   budesonide-formoterol 160-4.5 MCG/ACT inhaler Commonly known as: SYMBICORT Inhale 2 puffs into the lungs 2 (two) times daily. Started by: Coralyn Helling   buPROPion 150 MG 24 hr tablet Commonly known as: WELLBUTRIN XL Take 150 mg by mouth in the morning.   CINNAMON PLUS CHROMIUM PO Take 1 tablet by mouth in the morning.   CO-ENZYME Q-10 PO Take 100 mg by mouth in the morning.   famotidine 20 MG tablet Commonly known as: PEPCID TAKE ONE TABLET BY MOUTH DAILY AFTER SUPPER   Fiasp FlexTouch 100 UNIT/ML FlexTouch Pen Generic drug: insulin aspart Inject 2-8 Units into the skin every evening.   furosemide 40 MG tablet Commonly known as: LASIX TAKE ONE TABLET (40 MG TOTAL) BY MOUTH DAILY.   ipratropium 0.06 % nasal spray Commonly known as: ATROVENT Place 2 sprays into both nostrils 4 (four) times daily. What changed:  when to take this reasons to take this   ipratropium-albuterol 0.5-2.5 (3) MG/3ML Soln Commonly known as:  DUONEB Take 3 mLs by nebulization every 6 (six) hours as needed. Started by: Coralyn Helling   multivitamin with minerals Tabs tablet Take 1 tablet by mouth every evening.   OneTouch Delica Plus Lancet33G Misc Apply topically 2 (two) times daily.   pantoprazole 40 MG tablet Commonly known as: PROTONIX Take 40 mg by mouth at bedtime.   Pen Needles 32G X 4 MM Misc 1 Package by Does not apply route 4 (four) times daily -  before meals and at bedtime.   potassium chloride SA 20 MEQ tablet Commonly known as: KLOR-CON M TAKE ONE TABLET (20 MEQ TOTAL) BY MOUTH DAILY.   rosuvastatin 5 MG tablet Commonly known as: CRESTOR Take 5 mg by mouth at bedtime.   sertraline 100 MG tablet Commonly known as: ZOLOFT Take 100 mg by mouth in the morning.  Synjardy XR 12.04-999 MG Tb24 Generic drug: Empagliflozin-metFORMIN HCl ER Take 1 tablet by mouth 2 (two) times daily.   tamsulosin 0.4 MG Caps capsule Commonly known as: FLOMAX Take 0.4 mg by mouth at bedtime.   Toujeo Max SoloStar 300 UNIT/ML Solostar Pen Generic drug: insulin glargine (2 Unit Dial) Inject 20 Units into the skin in the morning.   Vitamin D3 125 MCG (5000 UT) Caps Take 5,000 Units by mouth in the morning.   zinc sulfate 220 (50 Zn) MG capsule Take 1 capsule (220 mg total) by mouth daily. What changed: when to take this        Signature:  Coralyn Helling, MD [facility]monary/Critical Care Pager - [phone] 08/20/2023, 2:35 PM

## 2023-08-20 NOTE — Patient Instructions (Signed)
Will schedule CT chest at [facility]  Symbicort two puffs in the morning and in the evening, and rinse your mouth after each use  Albuterol two puffs every 6 hours as needed for cough, wheeze, chest congestion, or shortness of breath  Ipratropium/albuterol one vial nebulized every 6 hours as needed for cough, wheeze, chest congestion, or shortness of breath  High dose flu shot today  Follow up in 2 months

## 2023-08-25 ENCOUNTER — Ambulatory Visit: Payer: PPO | Admitting: Psychiatry

## 2023-08-25 DIAGNOSIS — F4323 Adjustment disorder with mixed anxiety and depressed mood: Secondary | ICD-10-CM | POA: Diagnosis not present

## 2023-08-25 DIAGNOSIS — J9611 Chronic respiratory failure with hypoxia: Secondary | ICD-10-CM | POA: Diagnosis not present

## 2023-08-25 DIAGNOSIS — F5104 Psychophysiologic insomnia: Secondary | ICD-10-CM

## 2023-08-25 DIAGNOSIS — J9612 Chronic respiratory failure with hypercapnia: Secondary | ICD-10-CM | POA: Diagnosis not present

## 2023-08-25 NOTE — Progress Notes (Signed)
Psychotherapy Progress Note [facility] Marliss Czar, PhD LP  Patient ID: Tim Day [facility] "Tim Day")    MRN: [id number] Therapy format: Family therapy w/ patient -- accompanied by Tim Day Date: 08/25/2023      Start: 11:08a     Stop: 11:58a     Time Spent: 50 min Location: In-person   Session narrative (presenting needs, interim history, self-report of stressors and symptoms, applications of prior therapy, status changes, and interventions made in session) [facility] 9 weeks ago, which was 10 wks after initial encounter, citing wrong time that day (2p, vs 10a) and still needing to cancel.  Arrived close to time today, but when I went out to bring him back, he was reportedly in the bathroom.  Tim Day reports he didn't want to come -- in May, or in July, or today -- b/c "I'm not crazy", but PCP had encouraged him to remake the appt to work on his anxiety, having seen the [facility] in July on EHR.  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  She asks if there's something else I can prescribe him; clarified I am not a psychiatrist but the aim is to identify things he can do differently that will bring down anxiety and help it interfere less.  Pt comes back at 11:22, stops standing in the hall due to low O2 (83), toting a portable tank.  Accepted guidance to come the few more steps and sit down.  Noted sitting forward, panting, with pulse ox showing 82%.  Led to recline instead and breathe more diaphragmatically, and within about 5 min had restored to 94%.  Educated in deep breathing technique and conjectured that his lower lungs may   actually work better than upper, if he will prioritize bringing inhalations in lower.  Educated also why heart rate will increase, and how diaphragmatic breathing works both for low  O2 and for anxiety.  For laughs, framed siting up/back and inhaling lower as pretending he's an elephant raising its trunk.  Noted he dialed O2 supply up to 8 while recuperating, 4 after he restored O2.    Screening questionnaires taken today, GAD-7 = 11 (moderate), PHQ-9 = 8 (mild), with responses focused on anxiety about breathing, fatigue felt, and feeling useless due to his disabilities.  As for stigma, denies thinking he would be thought "crazy".  Kept the interaction lively and relaxed throughout, framing my role as being more of a Acupuncturist" for tuning up health behavior, not the other stuff.  Confirmed pleasant experience overall and willingness to return.  Shared interventions with Tim Day at end of session.  Learned that his specialists have been urging him to take on his anxiety more, use his exercise bike indoors, but he hasn't.  Would prefer he not wear pulse ox 24/7, but prioritized letting it stay as a biofeedback device while he works most on reshaping his habits of leaning forward and panting.  Noted also he has a niece who is a respiratory therapist, who gave him a spirometer, but he doesn't use it.  Encouraged to try it out, 3-5 breaths at a time, really work the effort to inhale, and see if it actually affects O2 level appreciably.    Therapeutic modalities: Cognitive Behavioral Therapy and Solution-Oriented/Positive Psychology  Mental Status/Observations:  Appearance:   Casual  Behavior:  Appropriate  Motor:  Normal and with O2 tank  Speech/Language:   Clear and Coherent  Affect:  Appropriate  Mood:  anxious and not so much  Thought process:  normal  Thought content:    Mild obsessions  Sensory/Perceptual disturbances:    WNL  Orientation:  Fully oriented  Attention:  Good    Concentration:  Good  Memory:  grossly intact  Insight:    Fair  Judgment:   Good  Impulse Control:  Good   Risk Assessment: Danger to Self: No Self-injurious Behavior: No Danger to Others:  No Physical Aggression / Violence: No Duty to Warn: No Access to Firearms a concern: No  Assessment of progress:  progressing  Diagnosis:   ICD-10-CM   1. Adjustment disorder with depressed mood  F43.21     2. Psychophysiological insomnia  F51.04     3. Chronic respiratory failure with hypoxia and hypercapnia (HCC)  J96.11    J96.12      Plan:  Breathing self-care -- Maintain any recommendations and regimen from pulmonology to help rebuild breathing capacity and balance sufficient oxygen support with appropriate challenge to native lung function.  Challenge to use spirometer 3-5 breaths and see how it does.  Challenge to use exercise bike as well, at whatever pace works.  Any O2 challenge counts as cardio. Anxiety -- Ideally, try not to wear O2 sensor continuously, but defer that for now.  Priority on recognizing panting and slumping, straighten up and breathe deeply instead.  Use elephant imagery if helpful. Sleep management -- Establish food and fluid curfews at least 2 hrs before bed, at least by 10pm.  Try to refrain from turning TV back on in the night once awake, use subdued light if wakeful.  Option to try melatonin 2 hrs before bed -- consult physician if questions.  Option OTC diphenhydramine sleep aid  Other recommendations/advice -- As may be noted above.  Continue to utilize previously learned skills ad lib. Medication compliance -- Maintain medication as prescribed and work faithfully with relevant prescriber(s) if any changes are desired or seem indicated. Crisis service -- Aware of call list and work-in appts.  Call the clinic on-call service, 988/hotline, 911, or present to [facility] or ER if any life-threatening psychiatric crisis. Followup -- Return pref 2-3 wks, as able, for time as available.  Next scheduled visit with me Visit date not found.  Next scheduled in this office Visit date not found.  Tim Fries, PhD Marliss Czar, PhD LP Clinical Psychologist, [facility] [address] [place], [place] [address] [phone]

## 2023-08-27 DIAGNOSIS — J9611 Chronic respiratory failure with hypoxia: Secondary | ICD-10-CM | POA: Diagnosis not present

## 2023-08-27 DIAGNOSIS — J8410 Pulmonary fibrosis, unspecified: Secondary | ICD-10-CM | POA: Diagnosis not present

## 2023-08-27 DIAGNOSIS — I5040 Unspecified combined systolic (congestive) and diastolic (congestive) heart failure: Secondary | ICD-10-CM | POA: Diagnosis not present

## 2023-08-29 ENCOUNTER — Telehealth: Payer: Self-pay

## 2023-08-29 ENCOUNTER — Other Ambulatory Visit (HOSPITAL_COMMUNITY): Payer: Self-pay

## 2023-08-29 NOTE — Telephone Encounter (Signed)
Pharmacy Patient Advocate Encounter   Received notification from CoverMyMeds that prior authorization for Symbicort 160-4.5MCG/ACT aerosol is required/requested.   Insurance verification completed.   The patient is insured through [facility] ADVANTAGE/RX ADVANCE .   Per test claim: PA required; PA submitted to [facility] ADVANTAGE/RX ADVANCE via CoverMyMeds Key/confirmation #/[id number] [facility] Status is pending

## 2023-09-03 NOTE — Telephone Encounter (Signed)
Pharmacy Patient Advocate Encounter  Received notification from [facility] ADVANTAGE/RX ADVANCE that Prior Authorization for Symbicort 160-4.5MCG/ACT aerosol has been DENIED.  Full denial letter will be uploaded to the media tab. See denial reason below.  Non-formulary exception rules not met. Plan requirements for the approval of this request:  Diagnosis of a medically-accepted indication, AND; The patient has tried and failed at least one formulary alternative,  OR;  The prescriber can submit a supporting statement to explain why the formulary drug(s) are not appropriate for this patient.  Your request has been denied as the patient has not tried and failed a formulary alternative used to treat the condition, nor has the supporting statement provided indicate why the use of a formulary product would be inappropriate. The formulary alternative options are Breo Ellipta, fluticasone-salmeterol, or Wixela.  PA #/Case ID/Reference #: [id number]  Please be advised we currently do not have a Pharmacist to review denials, therefore you will need to process appeals accordingly as needed. Thanks for your support at this time. Contact for appeals are as follows: Phone: [phone], Fax: [phone]

## 2023-09-03 NOTE — Telephone Encounter (Signed)
Symbicort is nonformulary.  Would recommend trying Breo once daily.  Rinse and gargle after using.

## 2023-09-04 NOTE — Telephone Encounter (Signed)
Breo 100 or 200? Thanks!

## 2023-09-04 NOTE — Telephone Encounter (Signed)
Breo 100 

## 2023-09-05 ENCOUNTER — Other Ambulatory Visit: Payer: Self-pay | Admitting: Internal Medicine

## 2023-09-05 MED ORDER — FLUTICASONE FUROATE-VILANTEROL 100-25 MCG/ACT IN AEPB: 1.0000 | INHALATION_SPRAY | Freq: Every day | RESPIRATORY_TRACT | 11 refills | Status: DC

## 2023-09-05 NOTE — Telephone Encounter (Signed)
Breo 100 sent to [facility] Pt notified.

## 2023-09-08 DIAGNOSIS — Z713 Dietary counseling and surveillance: Secondary | ICD-10-CM | POA: Diagnosis not present

## 2023-09-08 DIAGNOSIS — M7989 Other specified soft tissue disorders: Secondary | ICD-10-CM | POA: Diagnosis not present

## 2023-09-08 DIAGNOSIS — Z7901 Long term (current) use of anticoagulants: Secondary | ICD-10-CM | POA: Diagnosis not present

## 2023-09-08 DIAGNOSIS — Z9989 Dependence on other enabling machines and devices: Secondary | ICD-10-CM | POA: Diagnosis not present

## 2023-09-08 DIAGNOSIS — N390 Urinary tract infection, site not specified: Secondary | ICD-10-CM | POA: Diagnosis not present

## 2023-09-08 DIAGNOSIS — J9611 Chronic respiratory failure with hypoxia: Secondary | ICD-10-CM | POA: Diagnosis not present

## 2023-09-08 DIAGNOSIS — Z79899 Other long term (current) drug therapy: Secondary | ICD-10-CM | POA: Diagnosis not present

## 2023-09-08 DIAGNOSIS — Z6826 Body mass index (BMI) 26.0-26.9, adult: Secondary | ICD-10-CM | POA: Diagnosis not present

## 2023-09-08 DIAGNOSIS — I10 Essential (primary) hypertension: Secondary | ICD-10-CM | POA: Diagnosis not present

## 2023-09-14 ENCOUNTER — Inpatient Hospital Stay (HOSPITAL_COMMUNITY)
Admission: EM | Admit: 2023-09-14 | Discharge: 2023-09-25 | DRG: 291 | Disposition: A | Discharge status: Home Health | Payer: PPO | Attending: Internal Medicine | Admitting: Internal Medicine

## 2023-09-14 ENCOUNTER — Emergency Department (HOSPITAL_COMMUNITY): Payer: PPO

## 2023-09-14 ENCOUNTER — Other Ambulatory Visit: Payer: Self-pay

## 2023-09-14 ENCOUNTER — Encounter (HOSPITAL_COMMUNITY): Payer: Self-pay

## 2023-09-14 DIAGNOSIS — E876 Hypokalemia: Secondary | ICD-10-CM | POA: Diagnosis not present

## 2023-09-14 DIAGNOSIS — R0600 Dyspnea, unspecified: Secondary | ICD-10-CM | POA: Diagnosis not present

## 2023-09-14 DIAGNOSIS — R0989 Other specified symptoms and signs involving the circulatory and respiratory systems: Secondary | ICD-10-CM | POA: Diagnosis not present

## 2023-09-14 DIAGNOSIS — J8410 Pulmonary fibrosis, unspecified: Secondary | ICD-10-CM | POA: Diagnosis not present

## 2023-09-14 DIAGNOSIS — E871 Hypo-osmolality and hyponatremia: Secondary | ICD-10-CM | POA: Diagnosis present

## 2023-09-14 DIAGNOSIS — I272 Pulmonary hypertension, unspecified: Secondary | ICD-10-CM | POA: Diagnosis not present

## 2023-09-14 DIAGNOSIS — E1169 Type 2 diabetes mellitus with other specified complication: Secondary | ICD-10-CM | POA: Diagnosis present

## 2023-09-14 DIAGNOSIS — F419 Anxiety disorder, unspecified: Secondary | ICD-10-CM | POA: Diagnosis not present

## 2023-09-14 DIAGNOSIS — N5089 Other specified disorders of the male genital organs: Secondary | ICD-10-CM | POA: Diagnosis present

## 2023-09-14 DIAGNOSIS — Z86718 Personal history of other venous thrombosis and embolism: Secondary | ICD-10-CM | POA: Diagnosis not present

## 2023-09-14 DIAGNOSIS — N179 Acute kidney failure, unspecified: Secondary | ICD-10-CM | POA: Diagnosis not present

## 2023-09-14 DIAGNOSIS — Z8249 Family history of ischemic heart disease and other diseases of the circulatory system: Secondary | ICD-10-CM

## 2023-09-14 DIAGNOSIS — I5082 Biventricular heart failure: Secondary | ICD-10-CM | POA: Diagnosis present

## 2023-09-14 DIAGNOSIS — E1165 Type 2 diabetes mellitus with hyperglycemia: Secondary | ICD-10-CM | POA: Diagnosis not present

## 2023-09-14 DIAGNOSIS — I2781 Cor pulmonale (chronic): Secondary | ICD-10-CM | POA: Diagnosis not present

## 2023-09-14 DIAGNOSIS — I509 Heart failure, unspecified: Secondary | ICD-10-CM | POA: Diagnosis not present

## 2023-09-14 DIAGNOSIS — Z7901 Long term (current) use of anticoagulants: Secondary | ICD-10-CM

## 2023-09-14 DIAGNOSIS — K219 Gastro-esophageal reflux disease without esophagitis: Secondary | ICD-10-CM | POA: Diagnosis present

## 2023-09-14 DIAGNOSIS — J690 Pneumonitis due to inhalation of food and vomit: Secondary | ICD-10-CM | POA: Diagnosis not present

## 2023-09-14 DIAGNOSIS — J9621 Acute and chronic respiratory failure with hypoxia: Secondary | ICD-10-CM | POA: Diagnosis not present

## 2023-09-14 DIAGNOSIS — J9611 Chronic respiratory failure with hypoxia: Secondary | ICD-10-CM | POA: Diagnosis present

## 2023-09-14 DIAGNOSIS — I110 Hypertensive heart disease with heart failure: Principal | ICD-10-CM | POA: Diagnosis present

## 2023-09-14 DIAGNOSIS — I2723 Pulmonary hypertension due to lung diseases and hypoxia: Secondary | ICD-10-CM | POA: Diagnosis not present

## 2023-09-14 DIAGNOSIS — D751 Secondary polycythemia: Secondary | ICD-10-CM | POA: Diagnosis present

## 2023-09-14 DIAGNOSIS — J9601 Acute respiratory failure with hypoxia: Secondary | ICD-10-CM | POA: Diagnosis not present

## 2023-09-14 DIAGNOSIS — J449 Chronic obstructive pulmonary disease, unspecified: Secondary | ICD-10-CM | POA: Diagnosis not present

## 2023-09-14 DIAGNOSIS — Z833 Family history of diabetes mellitus: Secondary | ICD-10-CM

## 2023-09-14 DIAGNOSIS — E873 Alkalosis: Secondary | ICD-10-CM | POA: Diagnosis not present

## 2023-09-14 DIAGNOSIS — B370 Candidal stomatitis: Secondary | ICD-10-CM | POA: Diagnosis not present

## 2023-09-14 DIAGNOSIS — Z515 Encounter for palliative care: Secondary | ICD-10-CM | POA: Diagnosis not present

## 2023-09-14 DIAGNOSIS — Z9981 Dependence on supplemental oxygen: Secondary | ICD-10-CM

## 2023-09-14 DIAGNOSIS — I5043 Acute on chronic combined systolic (congestive) and diastolic (congestive) heart failure: Secondary | ICD-10-CM

## 2023-09-14 DIAGNOSIS — Z66 Do not resuscitate: Secondary | ICD-10-CM | POA: Diagnosis not present

## 2023-09-14 DIAGNOSIS — E875 Hyperkalemia: Secondary | ICD-10-CM | POA: Diagnosis present

## 2023-09-14 DIAGNOSIS — Z794 Long term (current) use of insulin: Secondary | ICD-10-CM

## 2023-09-14 DIAGNOSIS — Z8616 Personal history of COVID-19: Secondary | ICD-10-CM | POA: Diagnosis not present

## 2023-09-14 DIAGNOSIS — Z85820 Personal history of malignant melanoma of skin: Secondary | ICD-10-CM

## 2023-09-14 DIAGNOSIS — J9811 Atelectasis: Secondary | ICD-10-CM | POA: Diagnosis not present

## 2023-09-14 DIAGNOSIS — E785 Hyperlipidemia, unspecified: Secondary | ICD-10-CM | POA: Diagnosis not present

## 2023-09-14 DIAGNOSIS — Z79899 Other long term (current) drug therapy: Secondary | ICD-10-CM

## 2023-09-14 DIAGNOSIS — E7800 Pure hypercholesterolemia, unspecified: Secondary | ICD-10-CM | POA: Diagnosis not present

## 2023-09-14 DIAGNOSIS — F32A Depression, unspecified: Secondary | ICD-10-CM | POA: Diagnosis present

## 2023-09-14 DIAGNOSIS — R600 Localized edema: Principal | ICD-10-CM

## 2023-09-14 DIAGNOSIS — Z452 Encounter for adjustment and management of vascular access device: Secondary | ICD-10-CM | POA: Diagnosis not present

## 2023-09-14 DIAGNOSIS — E119 Type 2 diabetes mellitus without complications: Secondary | ICD-10-CM | POA: Diagnosis not present

## 2023-09-14 DIAGNOSIS — I5040 Unspecified combined systolic (congestive) and diastolic (congestive) heart failure: Secondary | ICD-10-CM | POA: Diagnosis not present

## 2023-09-14 DIAGNOSIS — I5033 Acute on chronic diastolic (congestive) heart failure: Secondary | ICD-10-CM | POA: Diagnosis not present

## 2023-09-14 DIAGNOSIS — F5101 Primary insomnia: Secondary | ICD-10-CM | POA: Diagnosis present

## 2023-09-14 DIAGNOSIS — R0601 Orthopnea: Secondary | ICD-10-CM

## 2023-09-14 DIAGNOSIS — R918 Other nonspecific abnormal finding of lung field: Secondary | ICD-10-CM | POA: Diagnosis not present

## 2023-09-14 DIAGNOSIS — E782 Mixed hyperlipidemia: Secondary | ICD-10-CM | POA: Diagnosis present

## 2023-09-14 DIAGNOSIS — Z7189 Other specified counseling: Secondary | ICD-10-CM | POA: Diagnosis not present

## 2023-09-14 DIAGNOSIS — M7989 Other specified soft tissue disorders: Secondary | ICD-10-CM | POA: Diagnosis not present

## 2023-09-14 DIAGNOSIS — L89322 Pressure ulcer of left buttock, stage 2: Secondary | ICD-10-CM | POA: Diagnosis present

## 2023-09-14 DIAGNOSIS — R0602 Shortness of breath: Secondary | ICD-10-CM | POA: Diagnosis not present

## 2023-09-14 DIAGNOSIS — R590 Localized enlarged lymph nodes: Secondary | ICD-10-CM | POA: Diagnosis not present

## 2023-09-14 DIAGNOSIS — R54 Age-related physical debility: Secondary | ICD-10-CM | POA: Diagnosis present

## 2023-09-14 DIAGNOSIS — Z7951 Long term (current) use of inhaled steroids: Secondary | ICD-10-CM

## 2023-09-14 DIAGNOSIS — I50810 Right heart failure, unspecified: Secondary | ICD-10-CM | POA: Diagnosis not present

## 2023-09-14 DIAGNOSIS — G4733 Obstructive sleep apnea (adult) (pediatric): Secondary | ICD-10-CM | POA: Diagnosis present

## 2023-09-14 DIAGNOSIS — J849 Interstitial pulmonary disease, unspecified: Secondary | ICD-10-CM | POA: Diagnosis not present

## 2023-09-14 DIAGNOSIS — Z7984 Long term (current) use of oral hypoglycemic drugs: Secondary | ICD-10-CM

## 2023-09-14 DIAGNOSIS — Z85828 Personal history of other malignant neoplasm of skin: Secondary | ICD-10-CM

## 2023-09-14 LAB — COMPREHENSIVE METABOLIC PANEL
ALT: 73 U/L — ABNORMAL HIGH (ref 0–44)
AST: 61 U/L — ABNORMAL HIGH (ref 15–41)
Albumin: 3.5 g/dL (ref 3.5–5.0)
Alkaline Phosphatase: 82 U/L (ref 38–126)
Anion gap: 10 (ref 5–15)
BUN: 46 mg/dL — ABNORMAL HIGH (ref 8–23)
CO2: 26 mmol/L (ref 22–32)
Calcium: 8.4 mg/dL — ABNORMAL LOW (ref 8.9–10.3)
Chloride: 96 mmol/L — ABNORMAL LOW (ref 98–111)
Creatinine, Ser: 1.48 mg/dL — ABNORMAL HIGH (ref 0.61–1.24)
GFR, Estimated: 51 mL/min — ABNORMAL LOW (ref >60)
Glucose, Bld: 151 mg/dL — ABNORMAL HIGH (ref 70–99)
Potassium: 4 mmol/L (ref 3.5–5.1)
Sodium: 132 mmol/L — ABNORMAL LOW (ref 135–145)
Total Bilirubin: 0.8 mg/dL (ref 0.3–1.2)
Total Protein: 6.8 g/dL (ref 6.5–8.1)

## 2023-09-14 LAB — CBC
HCT: 53.2 % — ABNORMAL HIGH (ref 39.0–52.0)
Hemoglobin: 16.5 g/dL (ref 13.0–17.0)
MCH: 27.7 pg (ref 26.0–34.0)
MCHC: 31 g/dL (ref 30.0–36.0)
MCV: 89.4 fL (ref 80.0–100.0)
Platelets: 195 10*3/uL (ref 150–400)
RBC: 5.95 MIL/uL — ABNORMAL HIGH (ref 4.22–5.81)
RDW: 16 % — ABNORMAL HIGH (ref 11.5–15.5)
WBC: 10.2 10*3/uL (ref 4.0–10.5)
nRBC: 0 % (ref 0.0–0.2)

## 2023-09-14 LAB — TROPONIN I (HIGH SENSITIVITY): Troponin I (High Sensitivity): 12 ng/L (ref <18)

## 2023-09-14 LAB — BRAIN NATRIURETIC PEPTIDE: B Natriuretic Peptide: 668 pg/mL — ABNORMAL HIGH (ref 0.0–100.0)

## 2023-09-14 MED ORDER — ALBUTEROL SULFATE HFA 108 (90 BASE) MCG/ACT IN AERS: 2.0000 | INHALATION_SPRAY | RESPIRATORY_TRACT | Status: DC | PRN

## 2023-09-14 MED ORDER — FUROSEMIDE 10 MG/ML IJ SOLN
40.0000 mg | Freq: Once | INTRAMUSCULAR | Status: AC
  Administered 2023-09-14: 40 mg via INTRAVENOUS
  Filled 2023-09-14: qty 4

## 2023-09-14 NOTE — ED Provider Notes (Signed)
[place] EMERGENCY DEPARTMENT AT [facility] Provider Note   CSN: [id number] Arrival date & time: 09/14/23  1737     History  Chief Complaint  Patient presents with   Shortness of Breath    Tim Day is a 72 y.o. male.  Pt with hx chf, c/o sob and bilateral leg swelling, slowly worse in past week. Indicates compliant w home meds. Uses home o2 4-6 liters. Feels is making normal urine. Leg swelling is symmetric. +orthopnea. No new or worsening cough. No fever/chills. No chest pain. No abd pain or nvd. No dysuria. No fever or chills.   The history is provided by the patient and medical records.  Shortness of Breath Associated symptoms: no abdominal pain, no chest pain, no cough, no diaphoresis, no fever, no headaches, no neck pain, no rash, no sore throat and no vomiting        Home Medications Prior to Admission medications   Medication Sig Start Date End Date Taking? Authorizing Provider  ACCU-CHEK GUIDE test strip USE TO TEST TWICE DAILY.D 02/06/21   [provider]  acetaminophen (TYLENOL) 500 MG tablet Take 1,000 mg by mouth 2 (two) times daily as needed for moderate pain.    [provider]  albuterol (VENTOLIN HFA) 108 (90 Base) MCG/ACT inhaler Inhale 2 puffs into the lungs every 6 (six) hours as needed for wheezing or shortness of breath. 08/20/23   Coralyn Helling, MD  alclomethasone (ACLOVATE) 0.05 % cream Apply topically 2 (two) times daily as needed (Rash). 03/13/22   Sheffield, Judye Bos, PA-C  ALPRAZolam (XANAX) 0.25 MG tablet Take 0.25 mg by mouth daily as needed for anxiety.    [provider]  apixaban (ELIQUIS) 5 MG TABS tablet Take 1 tablet (5 mg total) by mouth 2 (two) times daily. 05/11/22   Vassie Loll, MD  ascorbic acid (VITAMIN C) 500 MG tablet Take 1 tablet (500 mg total) by mouth daily. 08/04/20   Johnson, Clanford L, MD  blood glucose meter kit and supplies KIT Dispense based on patient and insurance preference. Use  up to four times daily as directed. (FOR ICD-9 250.00, 250.01). 08/14/20   Azucena Fallen, MD  budesonide-formoterol [facility]) 160-4.5 MCG/ACT inhaler Inhale 2 puffs into the lungs 2 (two) times daily. 08/20/23   Coralyn Helling, MD  buPROPion (WELLBUTRIN XL) 150 MG 24 hr tablet Take 150 mg by mouth in the morning. 04/25/22   [provider]  Cholecalciferol (VITAMIN D3) 125 MCG (5000 UT) CAPS Take 5,000 Units by mouth in the morning.    [provider]  Chromium-Cinnamon (CINNAMON PLUS CHROMIUM PO) Take 1 tablet by mouth in the morning.    [provider]  CO-ENZYME Q-10 PO Take 100 mg by mouth in the morning.    [provider]  famotidine (PEPCID) 20 MG tablet TAKE ONE TABLET BY MOUTH DAILY AFTER SUPPER 09/05/23   Cobb, Ruby Cola, NP  FIASP FLEXTOUCH 100 UNIT/ML FlexTouch Pen Inject 2-8 Units into the skin every evening. 02/26/21   [provider]  fluticasone furoate-vilanterol (BREO ELLIPTA) 100-25 MCG/ACT AEPB Inhale 1 puff into the lungs daily. 09/05/23   Leslye Peer, MD  furosemide (LASIX) 40 MG tablet TAKE ONE TABLET (40 MG TOTAL) BY MOUTH DAILY. 06/09/23   Rollene Rotunda, MD  Insulin Pen Needle (PEN NEEDLES) 32G X 4 MM MISC 1 Package by Does not apply route 4 (four) times daily -  before meals and at bedtime. 08/14/20   Natale Milch,  Kristine Garbe, MD  ipratropium (ATROVENT) 0.06 % nasal spray Place 2 sprays into both nostrils 4 (four) times daily. Patient taking differently: Place 2 sprays into both nostrils 4 (four) times daily as needed (allergies.). 10/07/22   Nyoka Cowden, MD  ipratropium-albuterol (DUONEB) 0.5-2.5 (3) MG/3ML SOLN Take 3 mLs by nebulization every 6 (six) hours as needed. 08/20/23   Coralyn Helling, MD  Lancets [facility] DELICA PLUS [facility]) MISC Apply topically 2 (two) times daily. 12/07/21   [provider]  Multiple Vitamin (MULTIVITAMIN WITH MINERALS) TABS tablet Take 1 tablet by mouth every evening.    [provider]  pantoprazole (PROTONIX) 40 MG tablet Take 40 mg by mouth at bedtime.    [provider]  potassium chloride SA (KLOR-CON M) 20 MEQ tablet TAKE ONE TABLET (20 MEQ TOTAL) BY MOUTH DAILY. 04/24/23   Rollene Rotunda, MD  rosuvastatin (CRESTOR) 5 MG tablet Take 5 mg by mouth at bedtime. 03/05/21   [provider]  sertraline (ZOLOFT) 100 MG tablet Take 100 mg by mouth in the morning.    [provider]  SYNJARDY XR 12.04-999 MG TB24 Take 1 tablet by mouth 2 (two) times daily. 09/23/22   [provider]  tamsulosin (FLOMAX) 0.4 MG CAPS capsule Take 0.4 mg by mouth at bedtime. 01/28/22   [provider]  TOUJEO MAX SOLOSTAR 300 UNIT/ML Solostar Pen Inject 20 Units into the skin in the morning. 05/11/22   Vassie Loll, MD  zinc sulfate 220 (50 Zn) MG capsule Take 1 capsule (220 mg total) by mouth daily. Patient taking differently: Take 220 mg by mouth every evening. 08/04/20   Cleora Fleet, MD      Allergies    Patient has no known allergies.    Review of Systems   Review of Systems  Constitutional:  Negative for chills, diaphoresis and fever.  HENT:  Negative for sore throat.   Eyes:  Negative for redness.  Respiratory:  Positive for shortness of breath. Negative for cough.   Cardiovascular:  Positive for leg swelling. Negative for chest pain and palpitations.  Gastrointestinal:  Negative for abdominal pain, diarrhea and vomiting.  Genitourinary:  Negative for flank pain.  Musculoskeletal:  Negative for back pain and neck pain.  Skin:  Negative for rash.  Neurological:  Negative for headaches.  Hematological:  Does not bruise/bleed easily.  Psychiatric/Behavioral:  Negative for confusion.     Physical Exam Updated Vital Signs BP 112/83   Pulse 81   Temp 98 F (36.7 C)   Resp (!) 24   Ht 1.778 m (5\' 10" )   Wt 84.4 kg   SpO2 94%   BMI 26.69 kg/m  Physical Exam Vitals and nursing note reviewed.  Constitutional:       Appearance: Normal appearance. He is well-developed.  HENT:     Head: Atraumatic.     Nose: Nose normal.     Mouth/Throat:     Mouth: Mucous membranes are moist.     Pharynx: Oropharynx is clear.  Eyes:     General: No scleral icterus.    Conjunctiva/sclera: Conjunctivae normal.  Neck:     Trachea: No tracheal deviation.  Cardiovascular:     Rate and Rhythm: Normal rate and regular rhythm.     Pulses: Normal pulses.     Heart sounds: Normal heart sounds. No murmur heard.    No friction rub. No gallop.  Pulmonary:     Effort: No accessory muscle usage.  Breath sounds: Normal breath sounds.     Comments: Rales bases.  Abdominal:     General: Bowel sounds are normal. There is no distension.     Palpations: Abdomen is soft.     Tenderness: There is no abdominal tenderness. There is no guarding.  Genitourinary:    Comments: No cva tenderness. Musculoskeletal:        General: Swelling present.     Cervical back: Normal range of motion and neck supple. No rigidity.     Comments: Mod/severe, symmetric swelling bil legs to thighs.   Skin:    General: Skin is warm and dry.     Findings: No rash.  Neurological:     Mental Status: He is alert.     Comments: Alert, speech clear.   Psychiatric:        Mood and Affect: Mood normal.     ED Results / Procedures / Treatments   Labs (all labs ordered are listed, but only abnormal results are displayed) Results for orders placed or performed during the hospital encounter of 09/14/23  CBC  Result Value Ref Range   WBC 10.2 4.0 - 10.5 K/uL   RBC 5.95 (H) 4.22 - 5.81 MIL/uL   Hemoglobin 16.5 13.0 - 17.0 g/dL   HCT 16.1 (H) 09.6 - 04.5 %   MCV 89.4 80.0 - 100.0 fL   MCH 27.7 26.0 - 34.0 pg   MCHC 31.0 30.0 - 36.0 g/dL   RDW 40.9 (H) 81.1 - 91.4 %   Platelets 195 150 - 400 K/uL   nRBC 0.0 0.0 - 0.2 %  Comprehensive metabolic panel  Result Value Ref Range   Sodium 132 (L) 135 - 145 mmol/L   Potassium 4.0 3.5 - 5.1 mmol/L    Chloride 96 (L) 98 - 111 mmol/L   CO2 26 22 - 32 mmol/L   Glucose, Bld 151 (H) 70 - 99 mg/dL   BUN 46 (H) 8 - 23 mg/dL   Creatinine, Ser 7.82 (H) 0.61 - 1.24 mg/dL   Calcium 8.4 (L) 8.9 - 10.3 mg/dL   Total Protein 6.8 6.5 - 8.1 g/dL   Albumin 3.5 3.5 - 5.0 g/dL   AST 61 (H) 15 - 41 U/L   ALT 73 (H) 0 - 44 U/L   Alkaline Phosphatase 82 38 - 126 U/L   Total Bilirubin 0.8 0.3 - 1.2 mg/dL   GFR, Estimated 51 (L) >60 mL/min   Anion gap 10 5 - 15  Brain natriuretic peptide  Result Value Ref Range   B Natriuretic Peptide 668.0 (H) 0.0 - 100.0 pg/mL  Troponin I (High Sensitivity)  Result Value Ref Range   Troponin I (High Sensitivity) 12 <18 ng/L     EKG EKG Interpretation Date/Time:  Sunday September 14 2023 18:07:26 EDT Ventricular Rate:  82 PR Interval:  175 QRS Duration:  120 QT Interval:  401 QTC Calculation: 469 R Axis:   161  Text Interpretation: Sinus rhythm RBBB and LPFB Confirmed by Cathren Laine ([id number]) on 09/14/2023 6:33:07 PM  Radiology DG Chest Port 1 View  Result Date: 09/14/2023 CLINICAL DATA:  Shortness of breath and bilateral leg swelling. EXAM: PORTABLE CHEST 1 VIEW COMPARISON:  Apr 21, 2023 FINDINGS: The cardiac silhouette is mildly enlarged and unchanged in size. Low lung volumes are noted. Diffuse, chronic appearing increased interstitial lung markings are seen. Mild to moderate severity superimposed atelectasis and/or infiltrate is noted within the periphery of the bilateral lung bases. A stable nodular opacity is  seen overlying the left upper lobe. No pleural effusion or pneumothorax is identified. Multilevel degenerative changes are seen throughout the thoracic spine. IMPRESSION: Stable cardiomegaly and low lung volumes with mild to moderate severity bibasilar atelectasis and/or infiltrate. Electronically Signed   By: Aram Candela M.D.   On: 09/14/2023 19:25    Procedures Procedures    Medications Ordered in ED Medications  albuterol (VENTOLIN HFA)  108 (90 Base) MCG/ACT inhaler 2 puff (has no administration in time range)  furosemide (LASIX) injection 40 mg (has no administration in time range)    ED Course/ Medical Decision Making/ A&P                                 Medical Decision Making Problems Addressed: Acute and chronic respiratory failure with hypoxia [facility]): chronic illness or injury with exacerbation, progression, or side effects of treatment that poses a threat to life or bodily functions Acute on chronic combined systolic and diastolic CHF (congestive heart failure) (HCC): acute illness or injury with systemic symptoms that poses a threat to life or bodily functions AKI (acute kidney injury) [facility]): acute illness or injury Bilateral leg edema: acute illness or injury with systemic symptoms that poses a threat to life or bodily functions Orthopnea: acute illness or injury with systemic symptoms  Amount and/or Complexity of Data Reviewed External Data Reviewed: labs, radiology and notes. Labs: ordered. Decision-making details documented in ED Course. Radiology: ordered and independent interpretation performed. Decision-making details documented in ED Course. ECG/medicine tests: ordered and independent interpretation performed. Decision-making details documented in ED Course.  Risk Prescription drug management. Decision regarding hospitalization.   Iv ns. Continuous pulse ox and cardiac monitoring. Labs ordered/sent. Imaging ordered.   Differential diagnosis includes chf, aki, etc. Dispo decision including potential need for admission considered - will get labs and imaging and reassess.   Reviewed nursing notes and prior charts for additional history. External reports reviewed.   Cardiac monitor: sinus rhythm, rate 80.  Labs reviewed/interpreted by me - bnp is increased from prior. Given worsening leg edema and pt c/o increased sob/orthopnea, lasix iv, and will consult medicine for admission.  Additional labs  reviewed/interpreted by me - aki. Wbc and hgb normal. Trop normal.   Xrays reviewed/interpreted by me - ?vascular congestion. Basilar atelectasis.             Final Clinical Impression(s) / ED Diagnoses Final diagnoses:  None    Rx / DC Orders ED Discharge Orders     None         Cathren Laine, MD 09/14/23 2028

## 2023-09-14 NOTE — ED Triage Notes (Signed)
Increase B/L Leg swelling x2 weeks Complains of SOB  Wears 4-6LPM O2 at all times Denies CP  89% on 4LPM in triage
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
## 2023-09-15 ENCOUNTER — Other Ambulatory Visit: Payer: Self-pay

## 2023-09-15 ENCOUNTER — Inpatient Hospital Stay (HOSPITAL_COMMUNITY): Payer: PPO

## 2023-09-15 ENCOUNTER — Encounter (HOSPITAL_COMMUNITY): Payer: Self-pay | Admitting: Family Medicine

## 2023-09-15 DIAGNOSIS — N179 Acute kidney failure, unspecified: Secondary | ICD-10-CM

## 2023-09-15 DIAGNOSIS — E1165 Type 2 diabetes mellitus with hyperglycemia: Secondary | ICD-10-CM

## 2023-09-15 DIAGNOSIS — I5033 Acute on chronic diastolic (congestive) heart failure: Secondary | ICD-10-CM

## 2023-09-15 DIAGNOSIS — F32A Depression, unspecified: Secondary | ICD-10-CM | POA: Insufficient documentation

## 2023-09-15 DIAGNOSIS — Z7189 Other specified counseling: Secondary | ICD-10-CM

## 2023-09-15 DIAGNOSIS — E119 Type 2 diabetes mellitus without complications: Secondary | ICD-10-CM

## 2023-09-15 DIAGNOSIS — Z86718 Personal history of other venous thrombosis and embolism: Secondary | ICD-10-CM

## 2023-09-15 DIAGNOSIS — F419 Anxiety disorder, unspecified: Secondary | ICD-10-CM

## 2023-09-15 DIAGNOSIS — Z515 Encounter for palliative care: Secondary | ICD-10-CM

## 2023-09-15 DIAGNOSIS — Z794 Long term (current) use of insulin: Secondary | ICD-10-CM

## 2023-09-15 DIAGNOSIS — E7800 Pure hypercholesterolemia, unspecified: Secondary | ICD-10-CM

## 2023-09-15 DIAGNOSIS — J9611 Chronic respiratory failure with hypoxia: Secondary | ICD-10-CM

## 2023-09-15 LAB — GLUCOSE, CAPILLARY
Glucose-Capillary: 209 mg/dL — ABNORMAL HIGH (ref 70–99)
Glucose-Capillary: 135 mg/dL — ABNORMAL HIGH (ref 70–99)
Glucose-Capillary: 155 mg/dL — ABNORMAL HIGH (ref 70–99)
Glucose-Capillary: 110 mg/dL — ABNORMAL HIGH (ref 70–99)
Glucose-Capillary: 245 mg/dL — ABNORMAL HIGH (ref 70–99)

## 2023-09-15 LAB — CBC WITH DIFFERENTIAL/PLATELET
Abs Immature Granulocytes: 0.11 10*3/uL — ABNORMAL HIGH (ref 0.00–0.07)
Basophils Absolute: 0 10*3/uL (ref 0.0–0.1)
Basophils Relative: 0 %
Eosinophils Absolute: 0.1 10*3/uL (ref 0.0–0.5)
Eosinophils Relative: 1 %
HCT: 52.5 % — ABNORMAL HIGH (ref 39.0–52.0)
Hemoglobin: 16.5 g/dL (ref 13.0–17.0)
Immature Granulocytes: 1 %
Lymphocytes Relative: 10 %
Lymphs Abs: 0.9 10*3/uL (ref 0.7–4.0)
MCH: 28.2 pg (ref 26.0–34.0)
MCHC: 31.4 g/dL (ref 30.0–36.0)
MCV: 89.6 fL (ref 80.0–100.0)
Monocytes Absolute: 0.6 10*3/uL (ref 0.1–1.0)
Monocytes Relative: 7 %
Neutro Abs: 7.3 10*3/uL (ref 1.7–7.7)
Neutrophils Relative %: 81 %
Platelets: 221 10*3/uL (ref 150–400)
RBC: 5.86 MIL/uL — ABNORMAL HIGH (ref 4.22–5.81)
RDW: 16.3 % — ABNORMAL HIGH (ref 11.5–15.5)
WBC: 9 10*3/uL (ref 4.0–10.5)
nRBC: 0 % (ref 0.0–0.2)

## 2023-09-15 LAB — COMPREHENSIVE METABOLIC PANEL
ALT: 78 U/L — ABNORMAL HIGH (ref 0–44)
AST: 70 U/L — ABNORMAL HIGH (ref 15–41)
Albumin: 3.2 g/dL — ABNORMAL LOW (ref 3.5–5.0)
Alkaline Phosphatase: 80 U/L (ref 38–126)
Anion gap: 12 (ref 5–15)
BUN: 45 mg/dL — ABNORMAL HIGH (ref 8–23)
CO2: 26 mmol/L (ref 22–32)
Calcium: 8.2 mg/dL — ABNORMAL LOW (ref 8.9–10.3)
Chloride: 95 mmol/L — ABNORMAL LOW (ref 98–111)
Creatinine, Ser: 1.34 mg/dL — ABNORMAL HIGH (ref 0.61–1.24)
GFR, Estimated: 57 mL/min — ABNORMAL LOW (ref >60)
Glucose, Bld: 164 mg/dL — ABNORMAL HIGH (ref 70–99)
Potassium: 3.7 mmol/L (ref 3.5–5.1)
Sodium: 133 mmol/L — ABNORMAL LOW (ref 135–145)
Total Bilirubin: 0.7 mg/dL (ref 0.3–1.2)
Total Protein: 6.5 g/dL (ref 6.5–8.1)

## 2023-09-15 LAB — HEMOGLOBIN A1C
Hgb A1c MFr Bld: 6.8 % — ABNORMAL HIGH (ref 4.8–5.6)
Mean Plasma Glucose: 148.46 mg/dL

## 2023-09-15 LAB — ECHOCARDIOGRAM COMPLETE
Area-P 1/2: 6.96 cm2
Est EF: 50
Height: 70 in
S' Lateral: 2.8 cm
Weight: 2804.25 [oz_av]

## 2023-09-15 LAB — HIV ANTIBODY (ROUTINE TESTING W REFLEX): HIV Screen 4th Generation wRfx: NONREACTIVE

## 2023-09-15 LAB — TSH: TSH: 1.636 u[IU]/mL (ref 0.350–4.500)

## 2023-09-15 LAB — MAGNESIUM: Magnesium: 2.4 mg/dL (ref 1.7–2.4)

## 2023-09-15 MED ORDER — ONDANSETRON HCL 4 MG/2ML IJ SOLN: 4.0000 mg | Freq: Four times a day (QID) | INTRAMUSCULAR | Status: DC | PRN

## 2023-09-15 MED ORDER — ALPRAZOLAM 0.25 MG PO TABS
0.2500 mg | ORAL_TABLET | Freq: Every day | ORAL | Status: DC | PRN
  Administered 2023-09-19 – 2023-09-22 (×3): 0.25 mg via ORAL
  Filled 2023-09-15 (×3): qty 1

## 2023-09-15 MED ORDER — BUPROPION HCL ER (XL) 150 MG PO TB24
150.0000 mg | ORAL_TABLET | Freq: Every day | ORAL | Status: DC
  Administered 2023-09-15 – 2023-09-25 (×11): 150 mg via ORAL
  Filled 2023-09-15 (×11): qty 1

## 2023-09-15 MED ORDER — MOMETASONE FURO-FORMOTEROL FUM 200-5 MCG/ACT IN AERO: 2.0000 | INHALATION_SPRAY | Freq: Two times a day (BID) | RESPIRATORY_TRACT | Status: DC

## 2023-09-15 MED ORDER — APIXABAN 5 MG PO TABS
5.0000 mg | ORAL_TABLET | Freq: Two times a day (BID) | ORAL | Status: DC
  Administered 2023-09-15 – 2023-09-25 (×21): 5 mg via ORAL
  Filled 2023-09-15 (×21): qty 1

## 2023-09-15 MED ORDER — PERFLUTREN LIPID MICROSPHERE
1.0000 mL | INTRAVENOUS | Status: AC | PRN
  Administered 2023-09-15: 2 mL via INTRAVENOUS

## 2023-09-15 MED ORDER — SERTRALINE HCL 100 MG PO TABS
100.0000 mg | ORAL_TABLET | Freq: Every day | ORAL | Status: DC
  Administered 2023-09-15 – 2023-09-25 (×11): 100 mg via ORAL
  Filled 2023-09-15: qty 1
  Filled 2023-09-15 (×2): qty 2
  Filled 2023-09-15 (×3): qty 1
  Filled 2023-09-15: qty 2
  Filled 2023-09-15 (×4): qty 1

## 2023-09-15 MED ORDER — ROSUVASTATIN CALCIUM 5 MG PO TABS
5.0000 mg | ORAL_TABLET | Freq: Every day | ORAL | Status: DC
  Administered 2023-09-15 – 2023-09-24 (×9): 5 mg via ORAL
  Filled 2023-09-15 (×9): qty 1

## 2023-09-15 MED ORDER — ACETAMINOPHEN 325 MG PO TABS
650.0000 mg | ORAL_TABLET | Freq: Four times a day (QID) | ORAL | Status: DC | PRN
  Administered 2023-09-15 – 2023-09-24 (×8): 650 mg via ORAL
  Filled 2023-09-15 (×8): qty 2

## 2023-09-15 MED ORDER — DAPAGLIFLOZIN PROPANEDIOL 10 MG PO TABS
10.0000 mg | ORAL_TABLET | Freq: Every day | ORAL | Status: DC
  Administered 2023-09-15 – 2023-09-25 (×11): 10 mg via ORAL
  Filled 2023-09-15 (×11): qty 1

## 2023-09-15 MED ORDER — ONDANSETRON HCL 4 MG PO TABS: 4.0000 mg | ORAL_TABLET | Freq: Four times a day (QID) | ORAL | Status: DC | PRN

## 2023-09-15 MED ORDER — FUROSEMIDE 10 MG/ML IJ SOLN
40.0000 mg | Freq: Every day | INTRAMUSCULAR | Status: DC
  Administered 2023-09-15: 40 mg via INTRAVENOUS
  Filled 2023-09-15: qty 4

## 2023-09-15 MED ORDER — INSULIN ASPART 100 UNIT/ML IJ SOLN
0.0000 [IU] | Freq: Every day | INTRAMUSCULAR | Status: DC
  Administered 2023-09-15 – 2023-09-16 (×2): 2 [IU] via SUBCUTANEOUS

## 2023-09-15 MED ORDER — OXYCODONE HCL 5 MG PO TABS: 5.0000 mg | ORAL_TABLET | ORAL | Status: DC | PRN

## 2023-09-15 MED ORDER — FAMOTIDINE 20 MG PO TABS
20.0000 mg | ORAL_TABLET | Freq: Every day | ORAL | Status: DC
  Administered 2023-09-15 – 2023-09-25 (×11): 20 mg via ORAL
  Filled 2023-09-15 (×11): qty 1

## 2023-09-15 MED ORDER — ALBUTEROL SULFATE (2.5 MG/3ML) 0.083% IN NEBU: 2.5000 mg | INHALATION_SOLUTION | RESPIRATORY_TRACT | Status: DC | PRN

## 2023-09-15 MED ORDER — FUROSEMIDE 10 MG/ML IJ SOLN
40.0000 mg | Freq: Two times a day (BID) | INTRAMUSCULAR | Status: DC
  Administered 2023-09-15 – 2023-09-16 (×2): 40 mg via INTRAVENOUS
  Filled 2023-09-15 (×2): qty 4

## 2023-09-15 MED ORDER — IPRATROPIUM-ALBUTEROL 0.5-2.5 (3) MG/3ML IN SOLN
3.0000 mL | Freq: Four times a day (QID) | RESPIRATORY_TRACT | Status: DC | PRN
  Administered 2023-09-15 – 2023-09-18 (×4): 3 mL via RESPIRATORY_TRACT
  Filled 2023-09-15 (×4): qty 3

## 2023-09-15 MED ORDER — FLUTICASONE FUROATE-VILANTEROL 100-25 MCG/ACT IN AEPB
1.0000 | INHALATION_SPRAY | Freq: Every day | RESPIRATORY_TRACT | Status: DC
  Administered 2023-09-15 – 2023-09-25 (×11): 1 via RESPIRATORY_TRACT
  Filled 2023-09-15 (×2): qty 28

## 2023-09-15 MED ORDER — MORPHINE SULFATE (PF) 2 MG/ML IV SOLN: 2.0000 mg | INTRAVENOUS | Status: DC | PRN

## 2023-09-15 MED ORDER — ACETAMINOPHEN 650 MG RE SUPP: 650.0000 mg | Freq: Four times a day (QID) | RECTAL | Status: DC | PRN

## 2023-09-15 MED ORDER — INSULIN ASPART 100 UNIT/ML IJ SOLN
0.0000 [IU] | Freq: Three times a day (TID) | INTRAMUSCULAR | Status: DC
  Administered 2023-09-15: 3 [IU] via SUBCUTANEOUS
  Administered 2023-09-15: 2 [IU] via SUBCUTANEOUS
  Administered 2023-09-16 (×2): 3 [IU] via SUBCUTANEOUS
  Administered 2023-09-17: 2 [IU] via SUBCUTANEOUS
  Administered 2023-09-17: 5 [IU] via SUBCUTANEOUS

## 2023-09-15 NOTE — Assessment & Plan Note (Addendum)
Hyponatremia, hypokalemia.   Today serum cr is 1,3 with K at 3,1 and serum bicarbonate at 42. Na 136 Mg 2,1   Plan to continue diuresis with furosemide  K correction with Kcl.

## 2023-09-15 NOTE — Consult Note (Signed)
Consultation Note Date: 09/15/2023   Patient Name: Tim Day  DOB: Sep 16, 1951  MRN: [id number]  Age / Sex: 72 y.o., male  PCP: Benita Stabile, MD Referring Physician: Almon Hercules, MD  Reason for Consultation: Establishing goals of care  HPI/Patient Profile: 72 y.o. male  with past medical history of chronic respiratory failure 4 to 5 L, history of DVT, HTN/HLD, DM2, general decline after 72-day hospital stay for COVID several years ago, GERD, malignant melanoma of the skin admitted on 09/14/2023 with CHF exacerbation.   Clinical Assessment and Goals of Care: I have reviewed medical records including EPIC notes, labs and imaging, received report from RN, assessed the patient.  Tim Day is sitting on the edge of the bed in his room.  He appears acutely/chronically ill and quite frail.  He is looking down toward his lap.  He tells me that he has held this posture since he was acutely ill, 72 days in the hospital with COVID.  He is alert and oriented x 3, able to make his needs known.  His brother, Bernette Redbird, is present at bedside.   We meet at the bedside to discuss diagnosis prognosis, GOC, EOL wishes, disposition and options.  I introduced Palliative Medicine as specialized medical care for people living with serious illness. It focuses on providing relief from the symptoms and stress of a serious illness. The goal is to improve quality of life for both the patient and the family.  We discussed a brief life review of the patient.  Tim Day states that he and his wife Kathie Rhodes have been married for 38 years.  He tells me that he does have a daughter.  He shares that he has had a decline since he was hospitalized with COVID for 72 days several years ago.   We then focused on their current illness.  We talk about his acute heart failure and the treatment plan.  We talked about diuretics to remove  fluid.  We talk about kidney function and the risks with diuretics.  We talked about fluid overload and respiratory status.  We talked about time for outcomes.   Brother Bernette Redbird asks about the importance of moving.  I shared that unfortunately, heart failure is a progressive disease, hallmarked by inability to have the energy for everyday activities.  The natural disease trajectory and expectations at EOL were discussed.  Advanced directives, concepts specific to code status, artifical feeding and hydration, and rehospitalization were considered and discussed.  We talked about the concept of "treat the treatable, but allowing natural passing".  Tim Day states that he would like to consider his choices.  I encouraged him to have these discussions about what he does and does not want with his wife.  PMT to follow.  Discussed the importance of continued conversation with family and the medical providers regarding overall plan of care and treatment options, ensuring decisions are within the context of the patient's values and GOCs. Questions and concerns were addressed.  The patient and family were  encouraged to call with questions or concerns.  PMT will continue to support holistically.  Conference with attending, bedside nursing staff, transition of care team related to patient condition, needs, goals of care, disposition.   HCPOA  NEXT OF KIN -wife of 38 years, Kathie Rhodes.    SUMMARY OF RECOMMENDATIONS   At this point continue full scope/full code Time for outcomes Considering short-term rehab if qualified Considering CODE STATUS PMT to follow   Code Status/Advance Care Planning: Full code - We talked about the concept of "treat the treatable, but allowing natural passing".  Tim Day states that he would like to consider his choices.  I encouraged him to have these discussions about what he does and does not want with his wife.  PMT to follow.  Symptom Management:  Per hospitalist, no  additional needs at this time.  Palliative Prophylaxis:  Frequent Pain Assessment and Oral Care  Additional Recommendations (Limitations, Scope, Preferences): Full Scope Treatment  Psycho-social/Spiritual:  Desire for further Chaplaincy support:no Additional Recommendations: Caregiving  Support/Resources  Prognosis:  Unable to determine, based on outcomes.  Guarded at this point.  6 months or less would not be surprising based on decreasing functional status, chronic illness burden.  Discharge Planning: To be determined,      Primary Diagnoses: Present on Admission:  Chronic respiratory failure with hypoxia (HCC)  Hypercholesteremia   I have reviewed the medical record, interviewed the patient and family, and examined the patient. The following aspects are pertinent.  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   Primary insomnia    Seborrheic dermatitis, unspecified    Squamous cell carcinoma of skin 06/14/2011   left forearm medial [id number] [id number]   Thrombocytopenia, unspecified (HCC)    Social History   Socioeconomic History   Marital status: Married    Spouse name: Not on file   Number of children: Not on file   Years of education: Not on file   Highest education level: Not on file  Occupational History   Not on file  Tobacco Use   Smoking status: Never   Smokeless tobacco: Never  Vaping Use   Vaping status: Never Used  Substance and Sexual Activity   Alcohol use: No   Drug use: No   Sexual activity: Never    Birth control/protection: None  Other Topics Concern    Not on file  Social History Narrative   Right handed   Drinks caffeine   One story home   Social Determinants of Health   Financial Resource Strain: Not on file  Food Insecurity: No Food Insecurity (09/15/2023)   Hunger Vital Sign    Worried About Running Out of Food in the Last Year: Never true    Ran Out of Food in the Last Year: Never true  Transportation Needs: No Transportation Needs (09/15/2023)   PRAPARE - Administrator, Civil Service (Medical): No    Lack of Transportation (Non-Medical): No  Physical Activity: Not on file  Stress: Not on file  Social Connections: Not on file   Family History  Problem Relation Age of Onset   CAD Father        CABG x 2 (1st in his 47's)   Diabetes Mother    Scheduled Meds:  apixaban  5 mg Oral BID   buPROPion  150 mg Oral Daily   famotidine  20 mg Oral Daily   fluticasone furoate-vilanterol  1 puff Inhalation Daily   furosemide  40 mg Intravenous BID   insulin aspart  0-15 Units Subcutaneous TID WC   insulin aspart  0-5 Units Subcutaneous QHS   rosuvastatin  5 mg Oral QHS   sertraline  100 mg Oral Daily   Continuous Infusions: PRN Meds:.acetaminophen **OR** acetaminophen, ALPRAZolam, ipratropium-albuterol, morphine injection, ondansetron **OR** ondansetron (ZOFRAN) IV, oxyCODONE Medications Prior to Admission:  Prior to Admission medications   Medication Sig Start Date End Date Taking? Authorizing Provider  albuterol (VENTOLIN HFA) 108 (90 Base) MCG/ACT inhaler Inhale 2 puffs into the lungs every 6 (six) hours as needed for wheezing or shortness of breath. 08/20/23  Yes Coralyn Helling, MD  ALPRAZolam Prudy Feeler) 0.25 MG tablet Take 0.25 mg by mouth daily as needed for anxiety.   Yes [provider]  apixaban (ELIQUIS) 5 MG TABS tablet Take 1 tablet (5 mg total) by mouth 2 (two) times daily. 05/11/22  Yes Vassie Loll, MD  ascorbic acid (VITAMIN C) 500 MG tablet Take 1 tablet (500 mg total) by mouth daily. 08/04/20  Yes  Johnson, Clanford L, MD  buPROPion (WELLBUTRIN XL) 150 MG 24 hr tablet Take 150 mg by mouth in the morning. 04/25/22  Yes [provider]  Cholecalciferol (VITAMIN D3) 125 MCG (5000 UT) CAPS Take 5,000 Units by mouth in the morning.   Yes [provider]  Chromium-Cinnamon (CINNAMON PLUS CHROMIUM PO) Take 1 tablet by mouth in the morning.   Yes [provider]  ciprofloxacin (CIPRO) 500 MG tablet Take 1 tablet every 12 hours by oral route. 09/08/23  Yes [provider]  CO-ENZYME Q-10 PO Take 100 mg by mouth in the morning.   Yes [provider]  famotidine (PEPCID) 20 MG tablet TAKE ONE TABLET BY MOUTH DAILY AFTER SUPPER Patient taking differently: Take 20 mg by mouth daily. After supper 09/05/23  Yes Cobb, Ruby Cola, NP  insulin aspart (NOVOLOG FLEXPEN) 100 UNIT/ML FlexPen Inject 2-18 Units into the skin daily after supper. Sliding scale   Yes [provider]  ipratropium (ATROVENT) 0.06 % nasal spray Place 2 sprays into both nostrils 4 (four) times daily. Patient taking differently: Place 2 sprays into both nostrils 4 (four) times daily as needed (allergies.). 10/07/22  Yes Nyoka Cowden, MD  ipratropium-albuterol (DUONEB) 0.5-2.5 (3) MG/3ML SOLN Take 3 mLs by nebulization every 6 (six) hours as needed. 08/20/23  Yes Coralyn Helling, MD  Multiple Vitamin (MULTIVITAMIN WITH MINERALS) TABS tablet Take 1 tablet by mouth every evening.   Yes [provider]  pantoprazole (PROTONIX) 40 MG tablet Take 40 mg by mouth at bedtime.   Yes [provider]  potassium chloride SA (KLOR-CON M) 20 MEQ tablet TAKE ONE TABLET (20 MEQ TOTAL) BY MOUTH DAILY. Patient taking differently: Take 20 mEq by mouth daily. Take with Furosemide 04/24/23  Yes Hochrein, Fayrene Fearing, MD  rosuvastatin (CRESTOR) 5 MG tablet Take 5 mg by mouth at bedtime. 03/05/21  Yes [provider]  sertraline (ZOLOFT) 100 MG tablet Take 100 mg by mouth in the morning.   Yes  [provider]  SYNJARDY XR 12.04-999 MG TB24 Take 1 tablet by mouth 2 (two) times daily. 09/23/22  Yes [provider]  tamsulosin (FLOMAX) 0.4 MG CAPS capsule Take 0.4 mg by mouth 2 (two) times daily. 01/28/22  Yes [provider]  TOUJEO MAX SOLOSTAR 300 UNIT/ML Solostar Pen Inject 20 Units into the skin in the morning. Patient taking differently: Inject 22 Units into the skin in the morning. 05/11/22  Yes Vassie Loll, MD  zinc sulfate 220 (50 Zn) MG capsule Take 1 capsule (220 mg total) by mouth daily. Patient taking differently: Take 220 mg by mouth every evening. 08/04/20  Yes Johnson, Clanford L, MD  ACCU-CHEK GUIDE test strip USE TO TEST TWICE DAILY.D 02/06/21   [provider]  acetaminophen (TYLENOL) 500 MG tablet Take 1,000 mg by mouth 2 (two) times daily as needed for moderate pain.    [provider]  alclomethasone (ACLOVATE) 0.05 % cream Apply topically 2 (two) times daily as needed (Rash). 03/13/22   Sheffield, Harvin Hazel R, PA-C  blood glucose meter kit and supplies KIT Dispense based on patient and insurance preference. Use up to four times daily as directed. (FOR ICD-9 250.00, 250.01). 08/14/20   Azucena Fallen, MD  doxycycline (VIBRA-TABS) 100 MG tablet Take 1 tablet twice a day by oral route.    [provider]  FIASP FLEXTOUCH 100 UNIT/ML FlexTouch Pen Inject 2-8 Units into the skin every evening. Patient not taking: Reported on 09/15/2023 02/26/21   [provider]  fluticasone furoate-vilanterol (BREO ELLIPTA) 100-25 MCG/ACT AEPB Inhale 1 puff into the lungs daily. 09/05/23   Leslye Peer, MD  furosemide (LASIX) 40 MG tablet TAKE ONE TABLET (40 MG TOTAL) BY MOUTH DAILY. 06/09/23   Rollene Rotunda, MD  Insulin Pen Needle (PEN NEEDLES) 32G X 4 MM MISC 1 Package by Does not apply route 4 (four) times daily -  before meals and at bedtime. 08/14/20   Azucena Fallen, MD  Lancets [facility] DELICA PLUS [place]) MISC  Apply topically 2 (two) times daily. 12/07/21   [provider]   No Known Allergies Review of Systems  Unable to perform ROS: Acuity of condition    Physical Exam Vitals and nursing note reviewed.  Constitutional:      General: He is not in acute distress.    Appearance: He is ill-appearing.  Neurological:     Mental Status: He is alert.     Vital Signs: BP 100/76 (BP Location: Right Arm)   Pulse 88   Temp (!) 97.5 F (36.4 C) (Oral)   Resp (!) 30   Ht 5\' 10"  (1.778 m)   Wt 79.5 kg   SpO2 97%   BMI 25.15 kg/m  Pain Scale: 0-10   Pain Score: Asleep   SpO2: SpO2: 97 % O2 Device:SpO2: 97 % O2 Flow Rate: .O2 Flow Rate (L/min): 6 L/min  IO: Intake/output summary:  Intake/Output Summary (Last 24 hours) at 09/15/2023 1248 Last data filed at 09/15/2023 1134 Gross per 24 hour  Intake 240 ml  Output 1225 ml  Net -985 ml    LBM: Last BM Date : 09/14/23 Baseline Weight: Weight: 84.4 kg Most recent weight: Weight: 79.5 kg     Palliative Assessment/Data:     Time In: 1140    Time Out: 1235 Time Total: 55 minutes  Greater than 50%  of this time was spent counseling and coordinating care related to the above assessment and plan.  Signed by: Shanesha Bednarz A  Marice Potter, NP   Please contact Palliative Medicine Team phone at [phone] for questions and concerns.  For individual provider: See Loretha Stapler

## 2023-09-15 NOTE — TOC Initial Note (Signed)
Transition of Care [facility]) - Initial/Assessment Note    Patient Details  Name: Tim Day MRN: [id number] Date of Birth: [birth year]  Transition of [facility]) CM/SW Contact:    Villa Herb, LCSWA Phone Number: 09/15/2023, 12:06 PM  Clinical Narrative:                 [facility] consulted for CHF screen. CSW met with pt at bedside to complete assessment. Pt lives with his wife. Pt states his wife assists with ADLs as needed and provides his transportation when needed. Pt has had HH in the past. Pt has home O2 supplied through [employer]. Pt states that he weighs himself daily and takes medications as prescribed. Pt tries to follow a heart healthy diet. TOC to follow.   Expected Discharge Plan: Home w Home Health Services Barriers to Discharge: Continued Medical Work up   Patient Goals and CMS Choice Patient states their goals for this hospitalization and ongoing recovery are:: return home CMS Medicare.gov Compare Post Acute Care list provided to:: Patient Choice offered to / list presented to : Patient      Expected Discharge Plan and Services In-house Referral: Clinical Social Work Discharge Planning Services: CM Consult Post Acute Care Choice: Home Health Living arrangements for the past 2 months: Single Family Home                                      Prior Living Arrangements/Services Living arrangements for the past 2 months: Single Family Home Lives with:: Self, Spouse Patient language and need for interpreter reviewed:: Yes Do you feel safe going back to the place where you live?: Yes      Need for Family Participation in Patient Care: Yes (Comment) Care giver support system in place?: Yes (comment) Current home services: DME Criminal Activity/Legal Involvement Pertinent to Current Situation/Hospitalization: No - Comment as needed  Activities of Daily Living   ADL Screening (condition at time of admission) Independently performs ADLs?: Yes (appropriate  for developmental age) Is the patient deaf or have difficulty hearing?: No Does the patient have difficulty seeing, even when wearing glasses/contacts?: No Does the patient have difficulty concentrating, remembering, or making decisions?: No  Permission Sought/Granted                  Emotional Assessment Appearance:: Appears stated age Attitude/Demeanor/Rapport: Engaged Affect (typically observed): Accepting Orientation: : Oriented to Self, Oriented to Place, Oriented to  Time, Oriented to Situation Alcohol / Substance Use: Not Applicable Psych Involvement: No (comment)  Admission diagnosis:  Orthopnea [R06.01] Bilateral leg edema [R60.0] CHF exacerbation (HCC) [I50.9] Acute and chronic respiratory failure with hypoxia (HCC) [J96.21] AKI (acute kidney injury) (HCC) [N17.9] Acute on chronic combined systolic and diastolic CHF (congestive heart failure) (HCC) [I50.43] Patient Active Problem List   Diagnosis Date Noted   History of DVT (deep vein thrombosis) 09/15/2023   Anxiety and depression 09/15/2023   AKI (acute kidney injury) (HCC) 09/15/2023   CHF exacerbation (HCC) 09/14/2023   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   Pulmonary HTN (HCC) 05/26/2022   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) 05/26/2022   Pulmonary fibrosis (HCC)    Acute on chronic diastolic CHF (congestive heart failure) (HCC)    Respiratory failure with hypoxia and hypercapnia (HCC) 05/06/2022   DOE (dyspnea on exertion) 04/08/2022   Acute respiratory failure with hypercapnia (HCC) 11/17/2021  Acute metabolic encephalopathy    Hyperglycemia due to diabetes mellitus (HCC)    Viral gastroenteritis    Solitary pulmonary nodule on lung CT 08/02/2021   Elevated troponin I level 07/09/2021   Severe sepsis (HCC) 04/09/2021   Acute cholecystitis 04/09/2021   Shock (HCC)    Acute on chronic respiratory failure with hypoxia (HCC)    Chronic respiratory failure with hypoxia  (HCC) 02/09/2021   Sinus arrhythmia 12/26/2020   Adjustment disorder with anxious mood 09/15/2020   Protein-calorie malnutrition, severe 08/25/2020   Primary spontaneous pneumothorax    Pneumonia of both lower lobes due to infectious organism    Pressure injury of skin 08/20/2020   Pneumothorax, left 08/19/2020   Pneumonia due to COVID-19 virus 08/19/2020   Leg DVT (deep venous thromboembolism), acute, bilateral (HCC) 08/19/2020   Sepsis (HCC) 08/08/2020   Hyponatremia 08/08/2020   Hypocalcemia 08/03/2020   Transaminitis 08/03/2020   COVID-19 virus infection 07/22/20 > 02 dep since 08/02/2020   Thrombocytopenia (HCC) 08/02/2020   Leukopenia 08/02/2020   Esophageal reflux    Chest tube in place 01/22/2015   Hypercholesteremia    Hypertension    Cardiogenic shock (HCC) 01/20/2015   DM2 (diabetes mellitus, type 2) (HCC) 01/20/2015   Benign essential HTN 01/20/2015   Dyslipidemia 01/20/2015   PCP:  Benita Stabile, MD Pharmacy:   [facility] - [place] - [address] [place] [address] Phone: [phone] Fax: [phone]     Social Determinants of Health (SDOH) Social History: SDOH Screenings   Food Insecurity: No Food Insecurity (09/15/2023)  Housing: Low Risk  (09/15/2023)  Transportation Needs: No Transportation Needs (09/15/2023)  Utilities: Not At Risk (09/15/2023)  Depression (PHQ2-9): Medium Risk (08/25/2023)  Tobacco Use: Low Risk  (09/14/2023)   SDOH Interventions:     Readmission Risk Interventions    09/15/2023   12:03 PM  Readmission Risk Prevention Plan  Transportation Screening Complete  Home Care Screening Complete  Medication Review (RN CM) Complete

## 2023-09-15 NOTE — Assessment & Plan Note (Signed)
-   Chest x-ray has been vascular congestion, patient complains of orthopnea, dyspnea on exertion, hypoxia down to the 70s, peripheral edema - PCP advised him to increase his Lasix from 40 mg daily to 60 mg daily - That helped with scrotal edema, but did not help with anything else - Last echo was in 2023 and showed an ejection fraction of 50-55% with right ventricle pressure overload - Continue Crestor - Continue diuresis - Monitor intake and output - Fluid restrictions - Update echo - Continue to monitor

## 2023-09-15 NOTE — Progress Notes (Signed)
   09/15/23 0735  ReDS Vest / Clip  Station Marker C  Ruler Value 33  ReDS Value Range 36 - 40  ReDS Actual Value 38

## 2023-09-15 NOTE — Assessment & Plan Note (Signed)
Continue Crestor

## 2023-09-15 NOTE — Assessment & Plan Note (Addendum)
Glucose has been stable  Continue with insulin sliding scale and basal (8 units)  Continue with statin therapy.

## 2023-09-15 NOTE — Progress Notes (Signed)
Dr. Carren Rang notified of urine output

## 2023-09-15 NOTE — Progress Notes (Signed)
  Echocardiogram 2D Echocardiogram has been performed.  Janalyn Harder 09/15/2023, 3:41 PM

## 2023-09-15 NOTE — Assessment & Plan Note (Addendum)
On alprazolam, sertraline and bupropion

## 2023-09-15 NOTE — Progress Notes (Signed)
   09/15/23 0241  Vitals  Pulse Rate 91  Resp (!) 34  MEWS COLOR  MEWS Score Color Yellow  Oxygen Therapy  SpO2 92 %  O2 Device Bi-PAP  O2 Flow Rate (L/min) 6 L/min  MEWS Score  MEWS Temp 0  MEWS Systolic 0  MEWS Pulse 0  MEWS RR 2  MEWS LOC 0  MEWS Score 2  Provider Notification  Provider Name/Title Dr. Carren Rang  Date Provider Notified 09/15/23  Time Provider Notified 0255  Method of Notification Page  Notification Reason Other (Comment) (yellow mews)  Provider response No new orders  Date of Provider Response 09/15/23   Patient is a yellow MEWS due to increased respirations, Dr. Carren Rang made aware, no new orders at this time.

## 2023-09-15 NOTE — Care Management Important Message (Signed)
Important Message  Patient Details  Name: Tim Day MRN: [id number] Date of Birth: 1951/10/27   Important Message Given:  N/A - LOS <3 / Initial given by admissions     Corey Harold 09/15/2023, 1:31 PM
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
## 2023-09-15 NOTE — Assessment & Plan Note (Deleted)
-   On 4-5 L nasal cannula at home - Continue Breo, Symbicort - On baseline O2 requirement at rest - Becomes hypoxic and tachypneic with any exertion - Secondary to CHF exacerbation - Continue diuresis

## 2023-09-15 NOTE — Assessment & Plan Note (Addendum)
Anticoagulation with apixaban.

## 2023-09-15 NOTE — Progress Notes (Signed)
PROGRESS NOTE  Tim Day GNF:[id number] DOB: 17-Oct-1951   PCP: Benita Stabile, MD  Patient is from: Home.  Lives with his wife.  DOA: 09/14/2023 LOS: 1  Chief complaints Chief Complaint  Patient presents with   Shortness of Breath     Brief Narrative / Interim history: 72 year old M with PMH of diastolic CHF, COPD, chronic hypoxic RF on 4 to 5 L and nightly BiPAP, DM-2, HTN, HLD, DVT on Eliquis, anxiety and depression presenting with shortness of breath and orthopnea, edema and overall decline, and admitted for acute on chronic diastolic CHF.  Overall decline since he had COVID-19 infection many years ago.  His BNP was elevated to 668.  Portable CXR with cardiomegaly, low lung volumes, mild to moderate bibasilar atelectasis and possible infiltrate.  Patient was started on IV Lasix and admitted.   Subjective: Seen and examined earlier this morning.  No major events overnight of this morning.  Continues to endorse shortness of breath, DOE and orthopnea.  Still with significant edema.  Patient's brother at bedside.  He had about 700 cc urine output overnight.  Creatinine improved.  Objective: Vitals:   09/15/23 0241 09/15/23 0448 09/15/23 0500 09/15/23 1443  BP:  100/76  104/87  Pulse: 91 88    Resp: (!) 34 (!) 30    Temp:  (!) 97.5 F (36.4 C)    TempSrc:  Oral    SpO2: 92% 97%    Weight:   79.5 kg   Height:        Examination:  GENERAL: No apparent distress.  Nontoxic. HEENT: MMM.  Vision and hearing grossly intact.  NECK: Supple.  No apparent JVD.  RESP: Barely speaks in full sentence due to dyspnea.  Crackles bilaterally. CVS:  RRR. Heart sounds normal.  ABD/GI/GU: BS+. Abd soft, NTND.  MSK/EXT:  Moves extremities. No apparent deformity.  3+ BLE edema.  SKIN: no apparent skin lesion or wound NEURO: Awake, alert and oriented appropriately.  No apparent focal neuro deficit. PSYCH: Calm. Normal affect.   Procedures:  None  Microbiology  summarized: None  Assessment and plan: Principal Problem:   CHF exacerbation (HCC) Active Problems:   DM2 (diabetes mellitus, type 2) (HCC)   Hypercholesteremia   Chronic respiratory failure with hypoxia (HCC)   History of DVT (deep vein thrombosis)   Anxiety and depression   AKI (acute kidney injury) (HCC)  Acute on chronic diastolic CHF/BiV heart failure: Seems like end-stage CHF.    TTE with LVEF of 50%, indeterminate DD, RVSP of 65.7 mmHg, mild to moderate TRVery dyspneic with minimal exertion.  Barely speaks in full sentence.  Has crackles and 3+ BLE edema on exam. -Increase IV Lasix to 40 mg twice daily -Strict intake and output, daily weight, renal functions and electrolytes -Start Farxiga 10 mg daily. -May involve cardiology based on progress. -Goal of care discussion.  Palliative medicine consulted as well.  Chronic respiratory failure with hypoxia, on 4 to 5 L by [place] and BiPAP at night. -Continue supplemental oxygen and BiPAP. -Continue inhalers/nebs. -Manage CHF as above   DM-2 with hyperglycemia and hyperlipidemia: A1c 6.8%. Recent Labs  Lab 09/15/23 0152 09/15/23 0722 09/15/23 1122 09/15/23 1633  GLUCAP 209* 135* 155* 110*  -Add Farxiga -Continue current insulin regimen -Continue Crestor.   Advance care planning: Extensive discussion with patient and patient's brother at bedside about CODE STATUS including pros and cons of CPR and intubation in light of his significant comorbidity as above.  Overall prognosis is poor.  PROGRESS NOTE  Tim Day GNF:[id number] DOB: 17-Oct-1951   PCP: Benita Stabile, MD  Patient is from: Home.  Lives with his wife.  DOA: 09/14/2023 LOS: 1  Chief complaints Chief Complaint  Patient presents with   Shortness of Breath     Brief Narrative / Interim history: 72 year old M with PMH of diastolic CHF, COPD, chronic hypoxic RF on 4 to 5 L and nightly BiPAP, DM-2, HTN, HLD, DVT on Eliquis, anxiety and depression presenting with shortness of breath and orthopnea, edema and overall decline, and admitted for acute on chronic diastolic CHF.  Overall decline since he had COVID-19 infection many years ago.  His BNP was elevated to 668.  Portable CXR with cardiomegaly, low lung volumes, mild to moderate bibasilar atelectasis and possible infiltrate.  Patient was started on IV Lasix and admitted.   Subjective: Seen and examined earlier this morning.  No major events overnight of this morning.  Continues to endorse shortness of breath, DOE and orthopnea.  Still with significant edema.  Patient's brother at bedside.  He had about 700 cc urine output overnight.  Creatinine improved.  Objective: Vitals:   09/15/23 0241 09/15/23 0448 09/15/23 0500 09/15/23 1443  BP:  100/76  104/87  Pulse: 91 88    Resp: (!) 34 (!) 30    Temp:  (!) 97.5 F (36.4 C)    TempSrc:  Oral    SpO2: 92% 97%    Weight:   79.5 kg   Height:        Examination:  GENERAL: No apparent distress.  Nontoxic. HEENT: MMM.  Vision and hearing grossly intact.  NECK: Supple.  No apparent JVD.  RESP: Barely speaks in full sentence due to dyspnea.  Crackles bilaterally. CVS:  RRR. Heart sounds normal.  ABD/GI/GU: BS+. Abd soft, NTND.  MSK/EXT:  Moves extremities. No apparent deformity.  3+ BLE edema.  SKIN: no apparent skin lesion or wound NEURO: Awake, alert and oriented appropriately.  No apparent focal neuro deficit. PSYCH: Calm. Normal affect.   Procedures:  None  Microbiology  summarized: None  Assessment and plan: Principal Problem:   CHF exacerbation (HCC) Active Problems:   DM2 (diabetes mellitus, type 2) (HCC)   Hypercholesteremia   Chronic respiratory failure with hypoxia (HCC)   History of DVT (deep vein thrombosis)   Anxiety and depression   AKI (acute kidney injury) (HCC)  Acute on chronic diastolic CHF/BiV heart failure: Seems like end-stage CHF.    TTE with LVEF of 50%, indeterminate DD, RVSP of 65.7 mmHg, mild to moderate TRVery dyspneic with minimal exertion.  Barely speaks in full sentence.  Has crackles and 3+ BLE edema on exam. -Increase IV Lasix to 40 mg twice daily -Strict intake and output, daily weight, renal functions and electrolytes -Start Farxiga 10 mg daily. -May involve cardiology based on progress. -Goal of care discussion.  Palliative medicine consulted as well.  Chronic respiratory failure with hypoxia, on 4 to 5 L by [place] and BiPAP at night. -Continue supplemental oxygen and BiPAP. -Continue inhalers/nebs. -Manage CHF as above   DM-2 with hyperglycemia and hyperlipidemia: A1c 6.8%. Recent Labs  Lab 09/15/23 0152 09/15/23 0722 09/15/23 1122 09/15/23 1633  GLUCAP 209* 135* 155* 110*  -Add Farxiga -Continue current insulin regimen -Continue Crestor.   Advance care planning: Extensive discussion with patient and patient's brother at bedside about CODE STATUS including pros and cons of CPR and intubation in light of his significant comorbidity as above.  Overall prognosis is poor.  CKMBINDEX", "TROPONINI" in the last 168 hours. No results for input(s): "PROBNP" in the last 8760 hours. Coagulation Profile: No results for input(s): "INR", "PROTIME" in the last 168 hours. Thyroid Function Tests: Recent Labs    09/15/23 0445  TSH 1.636   Lipid Profile: No results for input(s): "CHOL", "HDL", "LDLCALC", "TRIG", "CHOLHDL", "LDLDIRECT" in the last 72 hours. Anemia Panel: No results for input(s): "VITAMINB12", "FOLATE", "FERRITIN", "TIBC", "IRON", "RETICCTPCT" in the last 72 hours. Urine analysis:    Component Value Date/Time   COLORURINE YELLOW 11/17/2021 0024   APPEARANCEUR CLEAR 11/17/2021 0024   LABSPEC 1.025 11/17/2021 0024   PHURINE 5.5 11/17/2021 0024   GLUCOSEU >=500 (A) 11/17/2021 0024   HGBUR NEGATIVE 11/17/2021 0024   BILIRUBINUR NEGATIVE 11/17/2021 0024   BILIRUBINUR small (A) 04/06/2021 1435   KETONESUR 40 (A) 11/17/2021 0024   PROTEINUR 100 (A) 11/17/2021 0024   UROBILINOGEN 0.2 04/06/2021 1435   UROBILINOGEN 0.2 01/21/2015 1822    NITRITE NEGATIVE 11/17/2021 0024   LEUKOCYTESUR NEGATIVE 11/17/2021 0024   Sepsis Labs: Invalid input(s): "PROCALCITONIN", "LACTICIDVEN"  Microbiology: No results found for this or any previous visit (from the past 240 hour(s)).  Radiology Studies: ECHOCARDIOGRAM COMPLETE  Result Date: 09/15/2023    ECHOCARDIOGRAM REPORT   Patient Name:   Tim Day Date of Exam: 09/15/2023 Medical Rec #:  [id number]            Height:       70.0 in Accession #:    [id number]           Weight:       175.3 lb Date of Birth:  [birth year]            BSA:          1.973 m Patient Age:    72 years             BP:           104/87 mmHg Patient Gender: M                    HR:           75 bpm. Exam [facility] Procedure: 2D Echo, Cardiac Doppler, Color Doppler and Intracardiac            Opacification Agent REPORT CONTAINS CRITICAL RESULT Indications:    I50.40* Unspecified combined systolic (congestive) and diastolic                 (congestive) heart failure  History:        Patient has prior history of Echocardiogram examinations, most                 recent 09/19/2022. CHF, Pulmonary HTN, Signs/Symptoms:Dyspnea,                 Shortness of Breath and Edema; Risk Factors:Dyslipidemia,                 Diabetes and Hypertension. Cardiac shock. Pneumothorax (left).  Sonographer:    Sheralyn Boatman RDCS Referring Phys: [id number] ASIA B ZIERLE-GHOSH  Sonographer Comments: Technically difficult study due to poor echo windows. IMPRESSIONS  1. Left ventricular ejection fraction, by estimation, is 50%. The left ventricle has low normal function. Left ventricular endocardial border not optimally defined to evaluate regional wall motion. Left ventricular diastolic parameters are indeterminate. There is the interventricular septum is flattened in systole and diastole, consistent with right ventricular pressure and volume overload.  2.  CKMBINDEX", "TROPONINI" in the last 168 hours. No results for input(s): "PROBNP" in the last 8760 hours. Coagulation Profile: No results for input(s): "INR", "PROTIME" in the last 168 hours. Thyroid Function Tests: Recent Labs    09/15/23 0445  TSH 1.636   Lipid Profile: No results for input(s): "CHOL", "HDL", "LDLCALC", "TRIG", "CHOLHDL", "LDLDIRECT" in the last 72 hours. Anemia Panel: No results for input(s): "VITAMINB12", "FOLATE", "FERRITIN", "TIBC", "IRON", "RETICCTPCT" in the last 72 hours. Urine analysis:    Component Value Date/Time   COLORURINE YELLOW 11/17/2021 0024   APPEARANCEUR CLEAR 11/17/2021 0024   LABSPEC 1.025 11/17/2021 0024   PHURINE 5.5 11/17/2021 0024   GLUCOSEU >=500 (A) 11/17/2021 0024   HGBUR NEGATIVE 11/17/2021 0024   BILIRUBINUR NEGATIVE 11/17/2021 0024   BILIRUBINUR small (A) 04/06/2021 1435   KETONESUR 40 (A) 11/17/2021 0024   PROTEINUR 100 (A) 11/17/2021 0024   UROBILINOGEN 0.2 04/06/2021 1435   UROBILINOGEN 0.2 01/21/2015 1822    NITRITE NEGATIVE 11/17/2021 0024   LEUKOCYTESUR NEGATIVE 11/17/2021 0024   Sepsis Labs: Invalid input(s): "PROCALCITONIN", "LACTICIDVEN"  Microbiology: No results found for this or any previous visit (from the past 240 hour(s)).  Radiology Studies: ECHOCARDIOGRAM COMPLETE  Result Date: 09/15/2023    ECHOCARDIOGRAM REPORT   Patient Name:   Tim Day Date of Exam: 09/15/2023 Medical Rec #:  [id number]            Height:       70.0 in Accession #:    [id number]           Weight:       175.3 lb Date of Birth:  [birth year]            BSA:          1.973 m Patient Age:    72 years             BP:           104/87 mmHg Patient Gender: M                    HR:           75 bpm. [facility] Procedure: 2D Echo, Cardiac Doppler, Color Doppler and Intracardiac            Opacification Agent REPORT CONTAINS CRITICAL RESULT Indications:    I50.40* Unspecified combined systolic (congestive) and diastolic                 (congestive) heart failure  History:        Patient has prior history of Echocardiogram examinations, most                 recent 09/19/2022. CHF, Pulmonary HTN, Signs/Symptoms:Dyspnea,                 Shortness of Breath and Edema; Risk Factors:Dyslipidemia,                 Diabetes and Hypertension. Cardiac shock. Pneumothorax (left).  Sonographer:    Sheralyn Boatman RDCS Referring Phys: [id number] ASIA B ZIERLE-GHOSH  Sonographer Comments: Technically difficult study due to poor echo windows. IMPRESSIONS  1. Left ventricular ejection fraction, by estimation, is 50%. The left ventricle has low normal function. Left ventricular endocardial border not optimally defined to evaluate regional wall motion. Left ventricular diastolic parameters are indeterminate. There is the interventricular septum is flattened in systole and diastole, consistent with right ventricular pressure and volume overload.  2.  CKMBINDEX", "TROPONINI" in the last 168 hours. No results for input(s): "PROBNP" in the last 8760 hours. Coagulation Profile: No results for input(s): "INR", "PROTIME" in the last 168 hours. Thyroid Function Tests: Recent Labs    09/15/23 0445  TSH 1.636   Lipid Profile: No results for input(s): "CHOL", "HDL", "LDLCALC", "TRIG", "CHOLHDL", "LDLDIRECT" in the last 72 hours. Anemia Panel: No results for input(s): "VITAMINB12", "FOLATE", "FERRITIN", "TIBC", "IRON", "RETICCTPCT" in the last 72 hours. Urine analysis:    Component Value Date/Time   COLORURINE YELLOW 11/17/2021 0024   APPEARANCEUR CLEAR 11/17/2021 0024   LABSPEC 1.025 11/17/2021 0024   PHURINE 5.5 11/17/2021 0024   GLUCOSEU >=500 (A) 11/17/2021 0024   HGBUR NEGATIVE 11/17/2021 0024   BILIRUBINUR NEGATIVE 11/17/2021 0024   BILIRUBINUR small (A) 04/06/2021 1435   KETONESUR 40 (A) 11/17/2021 0024   PROTEINUR 100 (A) 11/17/2021 0024   UROBILINOGEN 0.2 04/06/2021 1435   UROBILINOGEN 0.2 01/21/2015 1822    NITRITE NEGATIVE 11/17/2021 0024   LEUKOCYTESUR NEGATIVE 11/17/2021 0024   Sepsis Labs: Invalid input(s): "PROCALCITONIN", "LACTICIDVEN"  Microbiology: No results found for this or any previous visit (from the past 240 hour(s)).  Radiology Studies: ECHOCARDIOGRAM COMPLETE  Result Date: 09/15/2023    ECHOCARDIOGRAM REPORT   Patient Name:   Tim Day Date of Exam: 09/15/2023 Medical Rec #:  [id number]            Height:       70.0 in Accession #:    [id number]           Weight:       175.3 lb Date of Birth:  [birth year]            BSA:          1.973 m Patient Age:    72 years             BP:           104/87 mmHg Patient Gender: M                    HR:           75 bpm. [facility] Procedure: 2D Echo, Cardiac Doppler, Color Doppler and Intracardiac            Opacification Agent REPORT CONTAINS CRITICAL RESULT Indications:    I50.40* Unspecified combined systolic (congestive) and diastolic                 (congestive) heart failure  History:        Patient has prior history of Echocardiogram examinations, most                 recent 09/19/2022. CHF, Pulmonary HTN, Signs/Symptoms:Dyspnea,                 Shortness of Breath and Edema; Risk Factors:Dyslipidemia,                 Diabetes and Hypertension. Cardiac shock. Pneumothorax (left).  Sonographer:    Sheralyn Boatman RDCS Referring Phys: [id number] ASIA B ZIERLE-GHOSH  Sonographer Comments: Technically difficult study due to poor echo windows. IMPRESSIONS  1. Left ventricular ejection fraction, by estimation, is 50%. The left ventricle has low normal function. Left ventricular endocardial border not optimally defined to evaluate regional wall motion. Left ventricular diastolic parameters are indeterminate. There is the interventricular septum is flattened in systole and diastole, consistent with right ventricular pressure and volume overload.  2.

## 2023-09-15 NOTE — Plan of Care (Signed)
Problem: Education: Goal: Ability to demonstrate management of disease process will improve Outcome: Progressing Goal: Ability to verbalize understanding of medication therapies will improve Outcome: Progressing Goal: Individualized Educational Video(s) Outcome: Progressing

## 2023-09-16 ENCOUNTER — Other Ambulatory Visit: Payer: Self-pay

## 2023-09-16 DIAGNOSIS — I50810 Right heart failure, unspecified: Secondary | ICD-10-CM

## 2023-09-16 DIAGNOSIS — Z515 Encounter for palliative care: Secondary | ICD-10-CM | POA: Diagnosis not present

## 2023-09-16 DIAGNOSIS — F419 Anxiety disorder, unspecified: Secondary | ICD-10-CM | POA: Diagnosis not present

## 2023-09-16 DIAGNOSIS — I5033 Acute on chronic diastolic (congestive) heart failure: Secondary | ICD-10-CM | POA: Diagnosis not present

## 2023-09-16 DIAGNOSIS — N179 Acute kidney failure, unspecified: Secondary | ICD-10-CM | POA: Diagnosis not present

## 2023-09-16 DIAGNOSIS — Z7189 Other specified counseling: Secondary | ICD-10-CM | POA: Diagnosis not present

## 2023-09-16 DIAGNOSIS — J9611 Chronic respiratory failure with hypoxia: Secondary | ICD-10-CM | POA: Diagnosis not present

## 2023-09-16 LAB — RENAL FUNCTION PANEL
Albumin: 3.5 g/dL (ref 3.5–5.0)
Anion gap: 12 (ref 5–15)
BUN: 50 mg/dL — ABNORMAL HIGH (ref 8–23)
CO2: 27 mmol/L (ref 22–32)
Calcium: 8.4 mg/dL — ABNORMAL LOW (ref 8.9–10.3)
Chloride: 93 mmol/L — ABNORMAL LOW (ref 98–111)
Creatinine, Ser: 1.47 mg/dL — ABNORMAL HIGH (ref 0.61–1.24)
GFR, Estimated: 51 mL/min — ABNORMAL LOW (ref >60)
Glucose, Bld: 130 mg/dL — ABNORMAL HIGH (ref 70–99)
Phosphorus: 5 mg/dL — ABNORMAL HIGH (ref 2.5–4.6)
Potassium: 4 mmol/L (ref 3.5–5.1)
Sodium: 132 mmol/L — ABNORMAL LOW (ref 135–145)

## 2023-09-16 LAB — LACTIC ACID, PLASMA: Lactic Acid, Venous: 1.7 mmol/L (ref 0.5–1.9)

## 2023-09-16 LAB — MRSA NEXT GEN BY PCR, NASAL: MRSA by PCR Next Gen: NOT DETECTED

## 2023-09-16 LAB — CBC
HCT: 56.8 % — ABNORMAL HIGH (ref 39.0–52.0)
Hemoglobin: 17 g/dL (ref 13.0–17.0)
MCH: 27.3 pg (ref 26.0–34.0)
MCHC: 29.9 g/dL — ABNORMAL LOW (ref 30.0–36.0)
MCV: 91.2 fL (ref 80.0–100.0)
Platelets: 262 10*3/uL (ref 150–400)
RBC: 6.23 MIL/uL — ABNORMAL HIGH (ref 4.22–5.81)
RDW: 17 % — ABNORMAL HIGH (ref 11.5–15.5)
WBC: 11.1 10*3/uL — ABNORMAL HIGH (ref 4.0–10.5)
nRBC: 0 % (ref 0.0–0.2)

## 2023-09-16 LAB — GLUCOSE, CAPILLARY
Glucose-Capillary: 236 mg/dL — ABNORMAL HIGH (ref 70–99)
Glucose-Capillary: 156 mg/dL — ABNORMAL HIGH (ref 70–99)
Glucose-Capillary: 202 mg/dL — ABNORMAL HIGH (ref 70–99)
Glucose-Capillary: 178 mg/dL — ABNORMAL HIGH (ref 70–99)
Glucose-Capillary: 205 mg/dL — ABNORMAL HIGH (ref 70–99)

## 2023-09-16 LAB — MAGNESIUM
Magnesium: 2.6 mg/dL — ABNORMAL HIGH (ref 1.7–2.4)
Magnesium: 2.4 mg/dL (ref 1.7–2.4)

## 2023-09-16 LAB — COOXEMETRY PANEL
Carboxyhemoglobin: 1 % (ref 0.5–1.5)
Methemoglobin: <0.7 % (ref 0.0–1.5)
O2 Saturation: 55.6 %
Total hemoglobin: 17.3 g/dL — ABNORMAL HIGH (ref 12.0–16.0)

## 2023-09-16 MED ORDER — SODIUM CHLORIDE 0.9% FLUSH: 10.0000 mL | INTRAVENOUS | Status: DC | PRN

## 2023-09-16 MED ORDER — DOBUTAMINE-DEXTROSE 4-5 MG/ML-% IV SOLN
2.5000 ug/kg/min | INTRAVENOUS | Status: DC
  Administered 2023-09-16 – 2023-09-19 (×2): 2.5 ug/kg/min via INTRAVENOUS
  Filled 2023-09-16 (×2): qty 250

## 2023-09-16 MED ORDER — CHLORHEXIDINE GLUCONATE CLOTH 2 % EX PADS
6.0000 | MEDICATED_PAD | Freq: Every day | CUTANEOUS | Status: DC
  Administered 2023-09-16 – 2023-09-25 (×10): 6 via TOPICAL

## 2023-09-16 MED ORDER — IPRATROPIUM-ALBUTEROL 0.5-2.5 (3) MG/3ML IN SOLN
3.0000 mL | Freq: Three times a day (TID) | RESPIRATORY_TRACT | Status: DC
  Administered 2023-09-16 – 2023-09-24 (×22): 3 mL via RESPIRATORY_TRACT
  Filled 2023-09-16 (×21): qty 3

## 2023-09-16 MED ORDER — FUROSEMIDE 10 MG/ML IJ SOLN
40.0000 mg | Freq: Once | INTRAMUSCULAR | Status: AC
  Administered 2023-09-16: 40 mg via INTRAVENOUS
  Filled 2023-09-16: qty 4

## 2023-09-16 MED ORDER — INSULIN GLARGINE-YFGN 100 UNIT/ML ~~LOC~~ SOLN
10.0000 [IU] | Freq: Every day | SUBCUTANEOUS | Status: DC
  Administered 2023-09-16 – 2023-09-17 (×2): 10 [IU] via SUBCUTANEOUS
  Filled 2023-09-16 (×3): qty 0.1

## 2023-09-16 MED ORDER — SODIUM CHLORIDE 0.9% FLUSH
10.0000 mL | Freq: Two times a day (BID) | INTRAVENOUS | Status: DC
  Administered 2023-09-16: 10 mL
  Administered 2023-09-18: 20 mL
  Administered 2023-09-18 – 2023-09-25 (×14): 10 mL

## 2023-09-16 MED ORDER — FUROSEMIDE 10 MG/ML IJ SOLN
60.0000 mg | Freq: Two times a day (BID) | INTRAMUSCULAR | Status: DC
  Administered 2023-09-16 – 2023-09-18 (×4): 60 mg via INTRAVENOUS
  Filled 2023-09-16 (×4): qty 6

## 2023-09-16 NOTE — Progress Notes (Signed)
   09/16/23 1226  Spiritual Encounters  Type of Visit Initial  Care provided to: Pt not available  Referral source Chaplain team  Reason for visit Routine spiritual support  OnCall Visit No   Chaplain attempted to visit patient and be a listening ear if he wished to talk. At the time of my visit Tim Day was receiving treatment. I will attempt to visit at another time.   Valerie Roys [facility]  [phone]

## 2023-09-16 NOTE — Progress Notes (Addendum)
PROGRESS NOTE  Tim Day:[id number] DOB: June 29, 1951   PCP: Benita Stabile, MD  Patient is from: Home.  Lives with his wife.  DOA: 09/14/2023 LOS: 2  Chief complaints Chief Complaint  Patient presents with   Shortness of Breath     Brief Narrative / Interim history: 72 year old M with PMH of diastolic CHF, COPD, chronic hypoxic RF on 4 to 5 L and nightly BiPAP, DM-2, HTN, HLD, DVT on Eliquis, anxiety and depression presenting with shortness of breath and orthopnea, edema and overall decline, and admitted for acute on chronic diastolic CHF.  Overall decline since he had COVID-19 infection many years ago.  His BNP was elevated to 668.  Portable CXR with cardiomegaly, low lung volumes, mild to moderate bibasilar atelectasis and possible infiltrate.  Patient was started on IV Lasix and admitted.  TTE with LVEF of 50%, indeterminate DD, RVSP of 65.7 mmHg, mild to moderate TR. diuresis limited by soft blood pressures.  Cardiology consulted and transferred patient to ICU for inotropes.    Subjective: Seen and examined earlier this morning.  No major events overnight of this morning.  Did not have a good sleep overnight.  Still with significant shortness of breath and marked edema.  Patient's brother noted some abdominal distention.  Denies chest pain.  About 1.6 L UOP/24 hours.  Creatinine slightly up but at baseline.  Objective: Vitals:   09/16/23 1350 09/16/23 1351 09/16/23 1400 09/16/23 1430  BP:  107/76 103/77 95/74  Pulse: 80 81 82 77  Resp: (!) 24 (!) 36 (!) 35 11  Temp:      TempSrc:      SpO2: 90% (!) 89% 94% 91%  Weight:      Height:        Examination:  GENERAL: Appears fatigued.  Nontoxic. HEENT: MMM.  Vision and hearing grossly intact.  NECK: Supple.  No apparent JVD.  RESP: On BiPAP.   Crackles bilaterally. CVS:  RRR. Heart sounds normal.  ABD/GI/GU: BS+. Abd soft, NTND.  MSK/EXT:  Moves extremities. No apparent deformity.  2+ BLE edema.  SKIN: no  apparent skin lesion or wound NEURO: Awake, alert and oriented appropriately.  No apparent focal neuro deficit. PSYCH: Calm. Normal affect.   Procedures:  None  Microbiology summarized: None  Assessment and plan: Principal Problem:   CHF exacerbation (HCC) Active Problems:   DM2 (diabetes mellitus, type 2) (HCC)   Hypercholesteremia   Chronic respiratory failure with hypoxia (HCC)   History of DVT (deep vein thrombosis)   Anxiety and depression   AKI (acute kidney injury) (HCC)  Acute on chronic diastolic CHF/BiV heart failure: Seems like end-stage CHF.    TTE with LVEF of 50%, indeterminate DD, RVSP of 65.7 mmHg, mild to moderate TRVery dyspneic with minimal exertion.  Still with significant edema.  Had 1.6 L UOP/24 hours.  Creatinine relatively stable.  Soft blood pressures. -Cardiology consulted and recommended transfer to ICU for PICC line and inotropes -Defer diuretics to cardiology. -Strict intake and output, daily weight, renal functions and electrolytes -Continue Farxiga. -Palliative medicine for following  Chronic respiratory failure with hypoxia, on 4 to 5 L by [place] and BiPAP at night. -Continue supplemental oxygen and BiPAP. -Continue inhalers/nebs. -Manage CHF as above   DM-2 with hyperglycemia and hyperlipidemia: A1c 6.8%. Recent Labs  Lab 09/15/23 1633 09/15/23 1933 09/15/23 2208 09/16/23 0716 09/16/23 1113  GLUCAP 110* 245* 236* 156* 202*  -Continue SSI-moderate -Add Semglee 10 units daily -Continue Farxiga -Continue Crestor.  Advance care planning: Remains full code.  See discussion on 10/14. -Palliative medicine following  AKI?  Unclear baseline.  His Cr was 1.0 about a year ago.  No interval value.  Cardiorenal? 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 -Monitor  Anxiety and  depression -Continue Xanax and Wellbutrin   History of DVT (deep vein thrombosis) -Continue Eliquis  Physical deconditioning -PT/OT eval  Body mass index is 27.99 kg/m.  Pressure skin injury: POA Pressure Injury 09/15/23 Buttocks Left Stage 2 -  Partial thickness loss of dermis presenting as a shallow open injury with a red, pink wound bed without slough. (Active)  09/15/23 0155  Location: Buttocks  Location Orientation: Left  Staging: Stage 2 -  Partial thickness loss of dermis presenting as a shallow open injury with a red, pink wound bed without slough.  Wound Description (Comments):   Present on Admission: Yes  Dressing Type Foam - Lift dressing to assess site every shift 09/15/23 0735   DVT prophylaxis:  SCDs Start: 09/15/23 0146 apixaban (ELIQUIS) tablet 5 mg  Code Status: Full code Family Communication: Updated patient's wife and brother at bedside Level of care: ICU Status is: Inpatient Remains inpatient appropriate because: Acute CHF/fluid overload   Final disposition: TBD Consultants:  Cardiology  55 minutes with more than 50% spent in reviewing records, counseling patient/family and coordinating care.   Sch Meds:  Scheduled Meds:  apixaban  5 mg Oral BID   buPROPion  150 mg Oral Daily   Chlorhexidine Gluconate Cloth  6 each Topical Daily   dapagliflozin propanediol  10 mg Oral Daily   famotidine  20 mg Oral Daily   fluticasone furoate-vilanterol  1 puff Inhalation Daily   furosemide  60 mg Intravenous BID   insulin aspart  0-15 Units Subcutaneous TID WC   insulin aspart  0-5 Units Subcutaneous QHS   ipratropium-albuterol  3 mL Nebulization TID   rosuvastatin  5 mg Oral QHS   sertraline  100 mg Oral Daily   Continuous Infusions: PRN Meds:.acetaminophen **OR** acetaminophen, ALPRAZolam, ipratropium-albuterol, morphine injection, ondansetron **OR** ondansetron (ZOFRAN) IV, oxyCODONE  Antimicrobials: Anti-infectives (From admission, onward)    None         I have personally reviewed the following labs and images: CBC: Recent Labs  Lab 09/14/23 1942 09/15/23 0445 09/16/23 0349  WBC 10.2 9.0 11.1*  NEUTROABS  --  7.3  --   HGB 16.5 16.5 17.0  HCT 53.2* 52.5* 56.8*  MCV 89.4 89.6 91.2  PLT 195 221 262   BMP &GFR Recent Labs  Lab 09/14/23 1942 09/15/23 0445 09/16/23 0349  NA 132* 133* 132*  K 4.0 3.7 4.0  CL 96* 95* 93*  CO2 26 26 27   GLUCOSE 151* 164* 130*  BUN 46* 45* 50*  CREATININE 1.48* 1.34* 1.47*  CALCIUM 8.4* 8.2* 8.4*  MG  --  2.4 2.6*  PHOS  --   --  5.0*   Estimated Creatinine Clearance: 52.4 mL/min (A) (by C-G formula based on SCr of 1.47 mg/dL (H)). Liver & Pancreas: Recent Labs  Lab 09/14/23 1942 09/15/23 0445 09/16/23 0349  AST 61* 70*  --   ALT 73* 78*  --   ALKPHOS 82 80  --   BILITOT 0.8 0.7  --   PROT 6.8 6.5  --   ALBUMIN 3.5 3.2* 3.5  No results for input(s): "LIPASE", "AMYLASE" in the last 168 hours. No results for input(s): "AMMONIA" in the last 168 hours. Diabetic: Recent Labs    09/15/23 0445  HGBA1C 6.8*   Recent Labs  Lab 09/15/23 1633 09/15/23 1933 09/15/23 2208 09/16/23 0716 09/16/23 1113  GLUCAP 110* 245* 236* 156* 202*   Cardiac Enzymes: No results for input(s): "CKTOTAL", "CKMB", "CKMBINDEX", "TROPONINI" in the last 168 hours. No results for input(s): "PROBNP" in the last 8760 hours. Coagulation Profile: No results for input(s): "INR", "PROTIME" in the last 168 hours. Thyroid Function Tests: Recent Labs    09/15/23 0445  TSH 1.636   Lipid Profile: No results for input(s): "CHOL", "HDL", "LDLCALC", "TRIG", "CHOLHDL", "LDLDIRECT" in the last 72 hours. Anemia Panel: No results for input(s): "VITAMINB12", "FOLATE", "FERRITIN", "TIBC", "IRON", "RETICCTPCT" in the last 72 hours. Urine analysis:    Component Value Date/Time   COLORURINE YELLOW 11/17/2021 0024   APPEARANCEUR CLEAR 11/17/2021 0024   LABSPEC 1.025 11/17/2021 0024   PHURINE 5.5 11/17/2021  0024   GLUCOSEU >=500 (A) 11/17/2021 0024   HGBUR NEGATIVE 11/17/2021 0024   BILIRUBINUR NEGATIVE 11/17/2021 0024   BILIRUBINUR small (A) 04/06/2021 1435   KETONESUR 40 (A) 11/17/2021 0024   PROTEINUR 100 (A) 11/17/2021 0024   UROBILINOGEN 0.2 04/06/2021 1435   UROBILINOGEN 0.2 01/21/2015 1822   NITRITE NEGATIVE 11/17/2021 0024   LEUKOCYTESUR NEGATIVE 11/17/2021 0024   Sepsis Labs: Invalid input(s): "PROCALCITONIN", "LACTICIDVEN"  Microbiology: No results found for this or any previous visit (from the past 240 hour(s)).  Radiology Studies: [place] EKG SITE RITE  Result Date: 09/16/2023 If Site Rite image not attached, placement could not be confirmed due to current cardiac rhythm.     Tim Day T. Dajohn Ellender [facility]  If 7PM-7AM, please contact night-coverage [email or web address] 09/16/2023, 4:02 PM

## 2023-09-16 NOTE — Plan of Care (Signed)

## 2023-09-16 NOTE — Progress Notes (Signed)
Took patient off Bipap at his request.  Placed patient on HFNC 6L, current sat at 93%.  RN made aware and she is to call if he has increased WOB to be placed back on.

## 2023-09-16 NOTE — Inpatient Diabetes Management (Signed)
Inpatient Diabetes Program Recommendations  AACE/ADA: New Consensus Statement on Inpatient Glycemic Control (2015)  Target Ranges:  Prepandial:   less than 140 mg/dL      Peak postprandial:   less than 180 mg/dL (1-2 hours)      Critically ill patients:  140 - 180 mg/dL   Lab Results  Component Value Date   GLUCAP 202 (H) 09/16/2023   HGBA1C 6.8 (H) 09/15/2023    Review of Glycemic Control  Latest Reference Range & Units 09/16/23 07:16 09/16/23 11:13  Glucose-Capillary 70 - 99 mg/dL 854 (H) 627 (H)   Diabetes history: DM 2 Outpatient Diabetes medications:  Novolog 2-18 units after supper Synjardy 12.04-999 mg bid [place] 22 units Current orders for Inpatient glycemic control:  Novolog 0-15 units tid with meals and HS Farxiga 10 mg daily Inpatient Diabetes Program Recommendations:   Consider adding Semglee 10 units daily.   Thanks,  Beryl Meager, RN, BC-ADM Inpatient Diabetes Coordinator Pager [phone]  (8a-5p)

## 2023-09-16 NOTE — Consult Note (Addendum)
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                 recent 09/19/2022. CHF, Pulmonary HTN, Signs/Symptoms:Dyspnea,                 Shortness of Breath and Edema; Risk Factors:Dyslipidemia,                 Diabetes and Hypertension. Cardiac shock. Pneumothorax (left).  Sonographer:    Sheralyn Boatman RDCS Referring Phys: [id number] ASIA B ZIERLE-GHOSH  Sonographer Comments: Technically difficult study due to poor echo windows. IMPRESSIONS  1. Left ventricular ejection fraction, by estimation, is 50%. The left ventricle has low normal function. Left ventricular endocardial border not optimally defined to evaluate regional wall motion. Left ventricular diastolic parameters are indeterminate. There is the interventricular septum is flattened in systole and diastole, consistent with right ventricular pressure and volume overload.  2. Right ventricular systolic function is severely reduced. The right ventricular size is severely enlarged. There is severely elevated pulmonary artery systolic pressure.  3. The mitral valve is normal in structure. No evidence of mitral valve regurgitation. No evidence of mitral stenosis.  4. The tricuspid valve is abnormal. Tricuspid valve  ALBUMIN 3.5 3.2* 3.5  AST 61* 70*  --   ALT 73* 78*  --   ALKPHOS 82 80  --   BILITOT 0.8 0.7  --    Lipids No results for input(s): "CHOL", "TRIG", "HDL", "LABVLDL", "LDLCALC", "CHOLHDL" in the last 168 hours.  Hematology Recent Labs  Lab 09/14/23 1942 09/15/23 0445 09/16/23 0349  WBC 10.2 9.0 11.1*  RBC 5.95* 5.86* 6.23*  HGB 16.5 16.5 17.0  HCT 53.2* 52.5* 56.8*  MCV 89.4 89.6 91.2  MCH 27.7 28.2 27.3  MCHC 31.0 31.4 29.9*  RDW 16.0* 16.3* 17.0*  PLT 195 221 262   Thyroid  Recent Labs  Lab 09/15/23 0445  TSH 1.636    BNP Recent Labs  Lab 09/14/23 1942  BNP 668.0*    DDimer No results for input(s): "DDIMER" in the last 168 hours.   Radiology/Studies:  ECHOCARDIOGRAM COMPLETE  Result Date: 09/15/2023    ECHOCARDIOGRAM REPORT   Patient Name:   Tim Day Date of Exam: 09/15/2023 Medical Rec #:  [id number]            Height:       70.0 in  Accession #:    [id number]           Weight:       175.3 lb Date of Birth:  1951/10/31            BSA:          1.973 m Patient Age:    72 years             BP:           104/87 mmHg Patient Gender: M                    HR:           75 bpm. [facility] Procedure: 2D Echo, Cardiac Doppler, Color Doppler and Intracardiac            Opacification Agent REPORT CONTAINS CRITICAL RESULT Indications:    I50.40* Unspecified combined systolic (congestive) and diastolic                 (congestive) heart failure  History:        Patient has prior history of Echocardiogram examinations, most                 recent 09/19/2022. CHF, Pulmonary HTN, Signs/Symptoms:Dyspnea,                 Shortness of Breath and Edema; Risk Factors:Dyslipidemia,                 Diabetes and Hypertension. Cardiac shock. Pneumothorax (left).  Sonographer:    Sheralyn Boatman RDCS Referring Phys: [id number] ASIA B ZIERLE-GHOSH  Sonographer Comments: Technically difficult study due to poor echo windows. IMPRESSIONS  1. Left ventricular ejection fraction, by estimation, is 50%. The left ventricle has low normal function. Left ventricular endocardial border not optimally defined to evaluate regional wall motion. Left ventricular diastolic parameters are indeterminate. There is the interventricular septum is flattened in systole and diastole, consistent with right ventricular pressure and volume overload.  2. Right ventricular systolic function is severely reduced. The right ventricular size is severely enlarged. There is severely elevated pulmonary artery systolic pressure.  3. The mitral valve is normal in structure. No evidence of mitral valve regurgitation. No evidence of mitral stenosis.  4. The tricuspid valve is abnormal. Tricuspid valve

## 2023-09-16 NOTE — Progress Notes (Signed)
Peripherally Inserted Central Catheter Placement  The IV Nurse has discussed with the patient and/or persons authorized to consent for the patient, the purpose of this procedure and the potential benefits and risks involved with this procedure.  The benefits include less needle sticks, lab draws from the catheter, and the patient may be discharged home with the catheter. Risks include, but not limited to, infection, bleeding, blood clot (thrombus formation), and puncture of an artery; nerve damage and irregular heartbeat and possibility to perform a PICC exchange if needed/ordered by physician.  Alternatives to this procedure were also discussed.  Bard Power PICC patient education guide, fact sheet on infection prevention and patient information card has been provided to patient /or left at bedside.    PICC Placement Documentation  PICC Double Lumen 09/16/23 Right Brachial 46 cm 0 cm (Active)  Indication for Insertion or Continuance of Line Vasoactive infusions 09/16/23 1900  Exposed Catheter (cm) 0 cm 09/16/23 1900  Site Assessment Clean, Dry, Intact 09/16/23 1900  Lumen #1 Status Flushed;Saline locked;Blood return noted 09/16/23 1900  Lumen #2 Status Flushed;Saline locked;Blood return noted 09/16/23 1900  Dressing Type Transparent;Securing device 09/16/23 1900  Dressing Status Antimicrobial disc in place;Clean, Dry, Intact 09/16/23 1900  Line Care Connections checked and tightened 09/16/23 1900  Line Adjustment (NICU/IV Team Only) No 09/16/23 1900  Dressing Intervention New dressing 09/16/23 1900  Dressing Change Due 09/23/23 09/16/23 1900       Timmothy Sours 09/16/2023, 7:16 PM

## 2023-09-16 NOTE — Progress Notes (Signed)
Received pt on 6l hfnc, pt with labored breathing and sob, c/o not receiving adequate air, RT aware, pt placed on bipap. Pt improved with bipap placement. Family at bedside.

## 2023-09-16 NOTE — Progress Notes (Signed)
Palliative: Tim Day is sitting up in the [place] chair in his room.  He appears acutely/chronically ill and frail.  He greets me, making and somewhat keeping eye contact.  He is alert and oriented, able to make his needs known.  His wife, Tim Day, is present at bedside.  Nearing the end of our conversation his brother, Tim Day, arrives.  We talk about cardiology visit and recommended changes.  We talk about plan for further evaluation in [facility] by the heart failure team.  We talk in detail about heart failure as a progressive and incurable disease.  We talk about the general treatment plan and pathway for heart failure.  We talked about medical management, fluid restrictions, exacerbations.  We talked about the benefits of outpatient palliative services for continued goals of care discussions along with tips and tricks on how to best manage chronic illness burdens.  We talked about service providers.  We discussed CODE STATUS in detail including the concept of "treat the treatable, but allow a natural passing".  We talk about statistics related to successful resuscitation.  We talk about trial of life support, how long would [name] accept intubation.  I encouraged [name] and his wife to consider what he does and does not want.  Conference with attending, cardiology, bedside nursing staff, transition of care team related to patient condition, needs, goals of care, disposition.  Plan: At this point continue full scope/full code.  Time for outcomes.  Considering CODE STATUS.  Considering outpatient palliative services.  50 minutes  Lillia Carmel, NP Palliative Medicine Team  Team Phone [phone]

## 2023-09-16 NOTE — Progress Notes (Signed)
PICC line placed, coox 55%. 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 Limited diuresis thus far today, will start dobutamine 2.5 mcg/kg/min, orders not to titrate overnight. F/u output in AM, repeat coox in AM   Dina Rich MD

## 2023-09-17 DIAGNOSIS — I50810 Right heart failure, unspecified: Secondary | ICD-10-CM | POA: Diagnosis not present

## 2023-09-17 DIAGNOSIS — Z7189 Other specified counseling: Secondary | ICD-10-CM | POA: Diagnosis not present

## 2023-09-17 DIAGNOSIS — Z515 Encounter for palliative care: Secondary | ICD-10-CM | POA: Diagnosis not present

## 2023-09-17 DIAGNOSIS — F419 Anxiety disorder, unspecified: Secondary | ICD-10-CM | POA: Diagnosis not present

## 2023-09-17 DIAGNOSIS — I5033 Acute on chronic diastolic (congestive) heart failure: Secondary | ICD-10-CM | POA: Diagnosis not present

## 2023-09-17 DIAGNOSIS — N179 Acute kidney failure, unspecified: Secondary | ICD-10-CM | POA: Diagnosis not present

## 2023-09-17 DIAGNOSIS — J9611 Chronic respiratory failure with hypoxia: Secondary | ICD-10-CM | POA: Diagnosis not present

## 2023-09-17 LAB — CBC
HCT: 53.9 % — ABNORMAL HIGH (ref 39.0–52.0)
Hemoglobin: 16.2 g/dL (ref 13.0–17.0)
MCH: 27.5 pg (ref 26.0–34.0)
MCHC: 30.1 g/dL (ref 30.0–36.0)
MCV: 91.4 fL (ref 80.0–100.0)
Platelets: 194 10*3/uL (ref 150–400)
RBC: 5.9 MIL/uL — ABNORMAL HIGH (ref 4.22–5.81)
RDW: 16.9 % — ABNORMAL HIGH (ref 11.5–15.5)
WBC: 9.1 10*3/uL (ref 4.0–10.5)
nRBC: 0.2 % (ref 0.0–0.2)

## 2023-09-17 LAB — HEPATIC FUNCTION PANEL
ALT: 98 U/L — ABNORMAL HIGH (ref 0–44)
AST: 67 U/L — ABNORMAL HIGH (ref 15–41)
Albumin: 3.1 g/dL — ABNORMAL LOW (ref 3.5–5.0)
Alkaline Phosphatase: 87 U/L (ref 38–126)
Bilirubin, Direct: 0.2 mg/dL (ref 0.0–0.2)
Indirect Bilirubin: 0.5 mg/dL (ref 0.3–0.9)
Total Bilirubin: 0.7 mg/dL (ref 0.3–1.2)
Total Protein: 6.3 g/dL — ABNORMAL LOW (ref 6.5–8.1)

## 2023-09-17 LAB — BASIC METABOLIC PANEL
Anion gap: 10 (ref 5–15)
BUN: 51 mg/dL — ABNORMAL HIGH (ref 8–23)
CO2: 28 mmol/L (ref 22–32)
Calcium: 8.1 mg/dL — ABNORMAL LOW (ref 8.9–10.3)
Chloride: 93 mmol/L — ABNORMAL LOW (ref 98–111)
Creatinine, Ser: 1.59 mg/dL — ABNORMAL HIGH (ref 0.61–1.24)
GFR, Estimated: 46 mL/min — ABNORMAL LOW (ref >60)
Glucose, Bld: 187 mg/dL — ABNORMAL HIGH (ref 70–99)
Potassium: 4.2 mmol/L (ref 3.5–5.1)
Sodium: 131 mmol/L — ABNORMAL LOW (ref 135–145)

## 2023-09-17 LAB — GLUCOSE, CAPILLARY
Glucose-Capillary: 129 mg/dL — ABNORMAL HIGH (ref 70–99)
Glucose-Capillary: 202 mg/dL — ABNORMAL HIGH (ref 70–99)
Glucose-Capillary: 108 mg/dL — ABNORMAL HIGH (ref 70–99)
Glucose-Capillary: 69 mg/dL — ABNORMAL LOW (ref 70–99)
Glucose-Capillary: 93 mg/dL (ref 70–99)

## 2023-09-17 LAB — COOXEMETRY PANEL
Carboxyhemoglobin: 1.3 % (ref 0.5–1.5)
Methemoglobin: <0.7 % (ref 0.0–1.5)
O2 Saturation: 66.7 %
Total hemoglobin: 16.7 g/dL — ABNORMAL HIGH (ref 12.0–16.0)

## 2023-09-17 LAB — LACTIC ACID, PLASMA: Lactic Acid, Venous: 1.1 mmol/L (ref 0.5–1.9)

## 2023-09-17 LAB — MAGNESIUM: Magnesium: 2.6 mg/dL — ABNORMAL HIGH (ref 1.7–2.4)

## 2023-09-17 NOTE — Progress Notes (Addendum)
Report called to RN on [facility] for pt.going to room 8 at [facility]. Code status reviewed. Drip rate verified. CVP monitoring bag and tubing not sent ,per RN they do not do CVP on that floor. All questions and concerns addressed.   [name] is here for pt's transport and all belongings have been gathered and sent with pt's wife.  Pt's room changed after [facility] had picked up pt. RN attempting to call report to [facility]. RN unable to take report at this time. Will call back.  Report called to [facility] on 3E. Code status,drip rate given. All questions and concerns addressed.

## 2023-09-17 NOTE — Progress Notes (Addendum)
09/17/2023   HGB 16.2 09/17/2023   HCT 53.9 (H) 09/17/2023   MCV 91.4 09/17/2023   MCH 27.5 09/17/2023   PLT 194 09/17/2023   MCHC 30.1 09/17/2023   RDW 16.9 (H) 09/17/2023   LYMPHSABS 0.9 09/15/2023   MONOABS 0.6 09/15/2023   EOSABS 0.1 09/15/2023   BASOSABS 0.0 09/15/2023     Last metabolic panel Lab Results  Component Value Date   NA 131 (L) 09/17/2023   K 4.2 09/17/2023   CL 93 (L) 09/17/2023   CO2 28 09/17/2023   BUN 51 (H) 09/17/2023   CREATININE 1.59 (H) 09/17/2023   GLUCOSE 187 (H) 09/17/2023   GFRNONAA 46 (L) 09/17/2023   GFRAA >60 09/05/2020   CALCIUM 8.1 (L) 09/17/2023   PHOS 5.0 (H) 09/16/2023   PROT 6.3 (L) 09/17/2023   ALBUMIN 3.1 (L) 09/17/2023   LABGLOB 2.6 04/06/2021   AGRATIO 1.7 04/06/2021   BILITOT 0.7 09/17/2023   ALKPHOS 87 09/17/2023   AST 67 (H) 09/17/2023   ALT 98 (H) 09/17/2023   ANIONGAP 10 09/17/2023    CBG (last 3)  Recent Labs    09/16/23 1621 09/16/23 2120 09/17/23 0758  GLUCAP 178* 205* 129*      Coagulation Profile: No results for input(s): "INR", "PROTIME" in the last 168 hours.   Radiology Studies: [place] EKG SITE RITE  Result Date: 09/16/2023 If Site Rite image not attached, placement could not be confirmed due to current cardiac rhythm.  ECHOCARDIOGRAM COMPLETE  Result Date: 09/15/2023    ECHOCARDIOGRAM REPORT   Patient Name:   Tim Day Date of  Exam: 09/15/2023 Medical Rec #:  [id number]            Height:       70.0 in Accession #:    [id number]           Weight:       175.3 lb Date of Birth:  Jan 27, 1951            BSA:          1.973 m Patient Age:    72 years             BP:           104/87 mmHg Patient Gender: M                    HR:           75 bpm. [facility] Procedure: 2D Echo, Cardiac Doppler, Color Doppler and Intracardiac            Opacification Agent REPORT CONTAINS CRITICAL RESULT Indications:    I50.40* Unspecified combined systolic (congestive) and diastolic                 (congestive) heart failure  History:        Patient has prior history of Echocardiogram examinations, most                 recent 09/19/2022. CHF, Pulmonary HTN, Signs/Symptoms:Dyspnea,                 Shortness of Breath and Edema; Risk Factors:Dyslipidemia,                 Diabetes and Hypertension. Cardiac shock. Pneumothorax (left).  Sonographer:    Sheralyn Boatman RDCS Referring Phys: [id number] ASIA B ZIERLE-GHOSH  Sonographer Comments: Technically difficult study due to poor echo windows. IMPRESSIONS  1. Left ventricular ejection  [facility]                                                                               Andon Villard, is a 72 y.o. male, DOB - 1951-03-01, GNF:[id number] Admit date - 09/14/2023    Outpatient Primary MD for the patient is Margo Aye, Kathleene Hazel, MD  LOS - 3  days    Brief summary   72 year old M with PMH of diastolic CHF, COPD, chronic hypoxic RF on 4 to 5 L and nightly BiPAP, DM-2, HTN, HLD, DVT on Eliquis, anxiety and depression presenting with shortness of breath and orthopnea, edema and overall decline, and admitted for acute on chronic diastolic CHF.  Overall decline since he had COVID-19 infection many years ago.  His BNP was elevated to 668.  Portable CXR with cardiomegaly, low lung volumes, mild to moderate bibasilar atelectasis and possible infiltrate.  Patient was started on IV Lasix and admitted.   TTE with LVEF of 50%, indeterminate DD, RVSP of 65.7 mmHg, mild to moderate TR. diuresis limited by soft blood pressures.  Cardiology consulted and transferred patient to ICU for inotropes.   Assessment & Plan    Assessment and Plan: Acute on chronic diastolic heart failure/biventricular heart failure Echocardiogram showed left ventricular ejection fraction of 50%, D-shaped septum with right ventricular pressure volume overload, severe right ventricular enlargement and dysfunction, severe pulmonary hypertension. Cardiology on board and started him on dobutamine gtt. Continue with IV diuresis. Diuresed about 2.7 L since admission. Continue to monitor creatinine. Cardiology wants the patient to be transferred to [facility] to be seen by heart failure and pulmonary team.    AKI (acute kidney injury) (HCC) Creatinine at baseline between 1-1.2 Slightly elevated at 1.59 today with IV diuresis Continue to monitor  Anxiety and depression - Continue Xanax and Wellbutrin  History of DVT (deep vein thrombosis) - Continue Eliquis  Chronic respiratory failure with  hypoxia (HCC) Patient on 5 L of nasal cannula oxygen during the day and BiPAP at night.    DM2 (diabetes mellitus, type 2) (HCC) CBG (last 3)  Recent Labs    09/16/23 1621 09/16/23 2120 09/17/23 0758  GLUCAP 178* 205* 129*   Resume sliding scale insulin, continue with Semglee 10 units daily.   Hyperlipidemia Continue with Crestor        RN Pressure Injury Documentation: Pressure Injury 08/19/20 Buttocks Bilateral Stage 2 -  Partial thickness loss of dermis presenting as a shallow open injury with a red, pink wound bed without slough. (Active)  08/19/20 1853  Location: Buttocks  Location Orientation: Bilateral  Staging: Stage 2 -  Partial thickness loss of dermis presenting as a shallow open injury with a red, pink wound bed without slough.  Wound Description (Comments):   Present on Admission: Yes     Pressure Injury 11/17/21 Buttocks Bilateral Stage 1 -  Intact skin with non-blanchable redness of a localized area usually over a bony prominence. (Active)  11/17/21 1325  Location: Buttocks  Location Orientation: Bilateral  Staging: Stage 1 -  Intact skin with non-blanchable redness of a localized area usually over a bony prominence.  Wound Description (Comments):   Present on Admission: Yes  09/17/2023   HGB 16.2 09/17/2023   HCT 53.9 (H) 09/17/2023   MCV 91.4 09/17/2023   MCH 27.5 09/17/2023   PLT 194 09/17/2023   MCHC 30.1 09/17/2023   RDW 16.9 (H) 09/17/2023   LYMPHSABS 0.9 09/15/2023   MONOABS 0.6 09/15/2023   EOSABS 0.1 09/15/2023   BASOSABS 0.0 09/15/2023     Last metabolic panel Lab Results  Component Value Date   NA 131 (L) 09/17/2023   K 4.2 09/17/2023   CL 93 (L) 09/17/2023   CO2 28 09/17/2023   BUN 51 (H) 09/17/2023   CREATININE 1.59 (H) 09/17/2023   GLUCOSE 187 (H) 09/17/2023   GFRNONAA 46 (L) 09/17/2023   GFRAA >60 09/05/2020   CALCIUM 8.1 (L) 09/17/2023   PHOS 5.0 (H) 09/16/2023   PROT 6.3 (L) 09/17/2023   ALBUMIN 3.1 (L) 09/17/2023   LABGLOB 2.6 04/06/2021   AGRATIO 1.7 04/06/2021   BILITOT 0.7 09/17/2023   ALKPHOS 87 09/17/2023   AST 67 (H) 09/17/2023   ALT 98 (H) 09/17/2023   ANIONGAP 10 09/17/2023    CBG (last 3)  Recent Labs    09/16/23 1621 09/16/23 2120 09/17/23 0758  GLUCAP 178* 205* 129*      Coagulation Profile: No results for input(s): "INR", "PROTIME" in the last 168 hours.   Radiology Studies: [place] EKG SITE RITE  Result Date: 09/16/2023 If Site Rite image not attached, placement could not be confirmed due to current cardiac rhythm.  ECHOCARDIOGRAM COMPLETE  Result Date: 09/15/2023    ECHOCARDIOGRAM REPORT   Patient Name:   Tim Day Date of  Exam: 09/15/2023 Medical Rec #:  [id number]            Height:       70.0 in Accession #:    [id number]           Weight:       175.3 lb Date of Birth:  Jan 27, 1951            BSA:          1.973 m Patient Age:    72 years             BP:           104/87 mmHg Patient Gender: M                    HR:           75 bpm. [facility] Procedure: 2D Echo, Cardiac Doppler, Color Doppler and Intracardiac            Opacification Agent REPORT CONTAINS CRITICAL RESULT Indications:    I50.40* Unspecified combined systolic (congestive) and diastolic                 (congestive) heart failure  History:        Patient has prior history of Echocardiogram examinations, most                 recent 09/19/2022. CHF, Pulmonary HTN, Signs/Symptoms:Dyspnea,                 Shortness of Breath and Edema; Risk Factors:Dyslipidemia,                 Diabetes and Hypertension. Cardiac shock. Pneumothorax (left).  Sonographer:    Sheralyn Boatman RDCS Referring Phys: [id number] ASIA B ZIERLE-GHOSH  Sonographer Comments: Technically difficult study due to poor echo windows. IMPRESSIONS  1. Left ventricular ejection  09/17/2023   HGB 16.2 09/17/2023   HCT 53.9 (H) 09/17/2023   MCV 91.4 09/17/2023   MCH 27.5 09/17/2023   PLT 194 09/17/2023   MCHC 30.1 09/17/2023   RDW 16.9 (H) 09/17/2023   LYMPHSABS 0.9 09/15/2023   MONOABS 0.6 09/15/2023   EOSABS 0.1 09/15/2023   BASOSABS 0.0 09/15/2023     Last metabolic panel Lab Results  Component Value Date   NA 131 (L) 09/17/2023   K 4.2 09/17/2023   CL 93 (L) 09/17/2023   CO2 28 09/17/2023   BUN 51 (H) 09/17/2023   CREATININE 1.59 (H) 09/17/2023   GLUCOSE 187 (H) 09/17/2023   GFRNONAA 46 (L) 09/17/2023   GFRAA >60 09/05/2020   CALCIUM 8.1 (L) 09/17/2023   PHOS 5.0 (H) 09/16/2023   PROT 6.3 (L) 09/17/2023   ALBUMIN 3.1 (L) 09/17/2023   LABGLOB 2.6 04/06/2021   AGRATIO 1.7 04/06/2021   BILITOT 0.7 09/17/2023   ALKPHOS 87 09/17/2023   AST 67 (H) 09/17/2023   ALT 98 (H) 09/17/2023   ANIONGAP 10 09/17/2023    CBG (last 3)  Recent Labs    09/16/23 1621 09/16/23 2120 09/17/23 0758  GLUCAP 178* 205* 129*      Coagulation Profile: No results for input(s): "INR", "PROTIME" in the last 168 hours.   Radiology Studies: [place] EKG SITE RITE  Result Date: 09/16/2023 If Site Rite image not attached, placement could not be confirmed due to current cardiac rhythm.  ECHOCARDIOGRAM COMPLETE  Result Date: 09/15/2023    ECHOCARDIOGRAM REPORT   Patient Name:   Tim Day Date of  Exam: 09/15/2023 Medical Rec #:  [id number]            Height:       70.0 in Accession #:    [id number]           Weight:       175.3 lb Date of Birth:  Jan 27, 1951            BSA:          1.973 m Patient Age:    72 years             BP:           104/87 mmHg Patient Gender: M                    HR:           75 bpm. [facility] Procedure: 2D Echo, Cardiac Doppler, Color Doppler and Intracardiac            Opacification Agent REPORT CONTAINS CRITICAL RESULT Indications:    I50.40* Unspecified combined systolic (congestive) and diastolic                 (congestive) heart failure  History:        Patient has prior history of Echocardiogram examinations, most                 recent 09/19/2022. CHF, Pulmonary HTN, Signs/Symptoms:Dyspnea,                 Shortness of Breath and Edema; Risk Factors:Dyslipidemia,                 Diabetes and Hypertension. Cardiac shock. Pneumothorax (left).  Sonographer:    Sheralyn Boatman RDCS Referring Phys: [id number] ASIA B ZIERLE-GHOSH  Sonographer Comments: Technically difficult study due to poor echo windows. IMPRESSIONS  1. Left ventricular ejection  09/17/2023   HGB 16.2 09/17/2023   HCT 53.9 (H) 09/17/2023   MCV 91.4 09/17/2023   MCH 27.5 09/17/2023   PLT 194 09/17/2023   MCHC 30.1 09/17/2023   RDW 16.9 (H) 09/17/2023   LYMPHSABS 0.9 09/15/2023   MONOABS 0.6 09/15/2023   EOSABS 0.1 09/15/2023   BASOSABS 0.0 09/15/2023     Last metabolic panel Lab Results  Component Value Date   NA 131 (L) 09/17/2023   K 4.2 09/17/2023   CL 93 (L) 09/17/2023   CO2 28 09/17/2023   BUN 51 (H) 09/17/2023   CREATININE 1.59 (H) 09/17/2023   GLUCOSE 187 (H) 09/17/2023   GFRNONAA 46 (L) 09/17/2023   GFRAA >60 09/05/2020   CALCIUM 8.1 (L) 09/17/2023   PHOS 5.0 (H) 09/16/2023   PROT 6.3 (L) 09/17/2023   ALBUMIN 3.1 (L) 09/17/2023   LABGLOB 2.6 04/06/2021   AGRATIO 1.7 04/06/2021   BILITOT 0.7 09/17/2023   ALKPHOS 87 09/17/2023   AST 67 (H) 09/17/2023   ALT 98 (H) 09/17/2023   ANIONGAP 10 09/17/2023    CBG (last 3)  Recent Labs    09/16/23 1621 09/16/23 2120 09/17/23 0758  GLUCAP 178* 205* 129*      Coagulation Profile: No results for input(s): "INR", "PROTIME" in the last 168 hours.   Radiology Studies: [place] EKG SITE RITE  Result Date: 09/16/2023 If Site Rite image not attached, placement could not be confirmed due to current cardiac rhythm.  ECHOCARDIOGRAM COMPLETE  Result Date: 09/15/2023    ECHOCARDIOGRAM REPORT   Patient Name:   Tim Day Date of  Exam: 09/15/2023 Medical Rec #:  [id number]            Height:       70.0 in Accession #:    [id number]           Weight:       175.3 lb Date of Birth:  Jan 27, 1951            BSA:          1.973 m Patient Age:    72 years             BP:           104/87 mmHg Patient Gender: M                    HR:           75 bpm. [facility] Procedure: 2D Echo, Cardiac Doppler, Color Doppler and Intracardiac            Opacification Agent REPORT CONTAINS CRITICAL RESULT Indications:    I50.40* Unspecified combined systolic (congestive) and diastolic                 (congestive) heart failure  History:        Patient has prior history of Echocardiogram examinations, most                 recent 09/19/2022. CHF, Pulmonary HTN, Signs/Symptoms:Dyspnea,                 Shortness of Breath and Edema; Risk Factors:Dyslipidemia,                 Diabetes and Hypertension. Cardiac shock. Pneumothorax (left).  Sonographer:    Sheralyn Boatman RDCS Referring Phys: [id number] ASIA B ZIERLE-GHOSH  Sonographer Comments: Technically difficult study due to poor echo windows. IMPRESSIONS  1. Left ventricular ejection

## 2023-09-17 NOTE — Progress Notes (Signed)
Palliative:   Tim Day is lying in bed in the intensive care.  He appears acutely/chronically ill and frail.  He has BiPAP in place.  He is alert, able to make his needs known.  His brothers Tim Day and Tim Day are present along with niece Tim Day.  Tim Day is taken off of BiPAP.   We talked about his heart failure/respiratory failure and the treatment plan.  We talk about cardiology consult and the treatment plan.  We talk about goal for connection with heart failure team at [facility].  Unsure of transport date/time.  Family asks about rehab and support after discharge.  We talked about this process, evaluation, insurance approval, and acceptance from a local rehab.  I share that ultimately, Tim Day decides what he does and does not want.  We talked about outpatient palliative services and "treat the treatable" hospice care at home.  What is and is not provided with each.  Return later in the day to find wife, Tim Day, present at bedside.  We talk about goal of transfer to [facility] for heart failure team.  We talked about CODE STATUS.  At this point Tim Day would not want elective intubation.  He is considering whether he would accept chest compressions.  His wife thinks that the chest compressions is not a good idea.  We also talk about the use of defibrillation and vasopressors/medications. We talk about outpatient palliative/hospice services.  Provider choice discussed.  They would like referral to [facility] for chronic illness management with transition to "treat the treatable" at home hospice when appropriate.  Conference with attending, bedside nursing staff, transition of care team related to patient condition, needs, goals of care, disposition.  Plan: At this point continue to treat the treatable but no elective intubation.  Considering no chest compressions.  We talk about the realities of CPR and that chest compressions usually go  hand-in-hand with intubation.  Continues to be agreeable to transfer to [facility] to be evaluated by the heart failure team  65 minutes, extended time Tim Carmel, NP Palliative Medicine Team  Team Phone [phone]
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
## 2023-09-17 NOTE — Progress Notes (Signed)
Placed on BIPAP at 17:44 due to increased work of breathing. 3E RT & RN are both aware.

## 2023-09-17 NOTE — Plan of Care (Signed)
  Problem: Acute Rehab PT Goals(only PT should resolve) Goal: Pt Will Go Supine/Side To Sit Outcome: Progressing Flowsheets (Taken 09/17/2023 1032) Pt will go Supine/Side to Sit: with supervision Goal: Patient Will Transfer Sit To/From Stand Outcome: Progressing Flowsheets (Taken 09/17/2023 1032) Patient will transfer sit to/from stand: with supervision Goal: Pt Will Transfer Bed To Chair/Chair To Bed Outcome: Progressing Flowsheets (Taken 09/17/2023 1032) Pt will Transfer Bed to Chair/Chair to Bed: with supervision Goal: Pt Will Ambulate Outcome: Progressing Flowsheets (Taken 09/17/2023 1032) Pt will Ambulate:  25 feet  with contact guard assist  with rolling walker  with supervision   10:33 AM, 09/17/23 Ocie Bob, MPT Physical Therapist with [facility] [phone] office [phone] mobile phone

## 2023-09-17 NOTE — Plan of Care (Signed)
  Problem: Acute Rehab OT Goals (only OT should resolve) Goal: Pt. Will Perform Grooming Flowsheets (Taken 09/17/2023 0925) Pt Will Perform Grooming:  with modified independence  standing Goal: Pt. Will Perform Lower Body Dressing Flowsheets (Taken 09/17/2023 0925) Pt Will Perform Lower Body Dressing:  with modified independence  sitting/lateral leans Goal: Pt. Will Transfer To Toilet Flowsheets (Taken 09/17/2023 [phone]) Pt Will Transfer to Toilet:  with modified independence  ambulating Goal: Pt. Will Perform Toileting-Clothing Manipulation Flowsheets (Taken 09/17/2023 0925) Pt Will Perform Toileting - Clothing Manipulation and hygiene:  with modified independence  sitting/lateral leans Goal: Pt/Caregiver Will Perform Home Exercise Program Flowsheets (Taken 09/17/2023 [phone]) Pt/caregiver will Perform Home Exercise Program:  Increased strength  Both right and left upper extremity  Independently  Trayonna Bachmeier OT, MOT

## 2023-09-17 NOTE — Evaluation (Addendum)
Occupational Therapy Evaluation Patient Details Name: Tim Day MRN: [id number] DOB: 1951/04/08 Today's Date: 09/17/2023   History of Present Illness Tim Day is a 72 y.o. male with medical history significant of chronic respiratory failure on 4-5 L nasal cannula at home, history of DVT, hypertension, hyperlipidemia, diabetes mellitus, general decline since having a 72-day hospital stay from COVID several years ago, and more presents the ED with a chief complaint of fluid overload.  Patient is on his normal nighttime BiPAP.  History is taken from wife.  She reports that last week patient had terrible scrotal pain.  They are using ice packs.  He called his PCP who prescribed Doxy.  He took Doxy for a few days, but still was not any better so then went into see the PCP.  PCP changed his prescription to Cipro.  He was also advised to increase his Lasix from 40 mg p.o. daily to 60 mg p.o. daily.  They did that, and it brought the scrotal swelling down, but he continued to have peripheral edema.  He is also had dyspnea.  Patient does have dyspnea at baseline since having COVID.  Recently though he has been more tachypneic, more winded with exertion.  He has to turn his oxygen tank up just to walk to the bathroom.  Wife reports this worsening in his breathing is been present since Wednesday, approximately 5 days.  He does have orthopnea.  He has not complained of any chest pain.  He is compliant with his BiPAP at home at night per wife.  She reports she seen his oxygen sats dropped down to the 70s with exertion.  He wears his pulse ox almost 24/7.  They have no other complaints at this time. (per DO)   Clinical Impression   Pt agreeable to OT and PT co-evaluation. Pt is mostly independent at baseline without use of AD as of late. Today pt required min A for bed mobility with labored effort and extended time. Pt was generally weak needing CGA to min A for transfer to chair and a few steps  within the room. Family reports being able to support pt at home. Pt likely needs set up to min A for some ADL's at this time. Pt was noted to desaturate to low to mid 80s while ambulating and on 6 LPM supplemental O2. Pt left in chair with SpO2 around 90%. Pt left in the chair with family present. Pt will benefit from continued OT in the hospital and recommended venue below to increase strength, balance, and endurance for safe ADL's.          If plan is discharge home, recommend the following: A little help with walking and/or transfers;A little help with bathing/dressing/bathroom;Assistance with cooking/housework;Assist for transportation;Help with stairs or ramp for entrance    Functional Status Assessment  Patient has had a recent decline in their functional status and demonstrates the ability to make significant improvements in function in a reasonable and predictable amount of time.  Equipment Recommendations  None recommended by OT           Precautions / Restrictions Precautions Precautions: Fall Restrictions Weight Bearing Restrictions: No      Mobility Bed Mobility Overal bed mobility: Needs Assistance Bed Mobility: Supine to Sit     Supine to sit: Min assist     General bed mobility comments: labored effort; extended time    Transfers Overall transfer level: Needs assistance Equipment used: Rolling walker (2 wheels) Transfers: Sit to/from  Stand, Bed to chair/wheelchair/BSC Sit to Stand: Contact guard assist, Min assist     Step pivot transfers: Contact guard assist     General transfer comment: labored effort      Balance Overall balance assessment: Needs assistance Sitting-balance support: Feet supported, Bilateral upper extremity supported Sitting balance-Leahy Scale: Fair Sitting balance - Comments: fair to good seated at EOB   Standing balance support: Bilateral upper extremity supported, During functional activity Standing balance-Leahy Scale:  Fair Standing balance comment: Using RW                           ADL either performed or assessed with clinical judgement   ADL Overall ADL's : Needs assistance/impaired     Grooming: Set up;Sitting   Upper Body Bathing: Set up;Sitting   Lower Body Bathing: Set up;Minimal assistance;Sitting/lateral leans   Upper Body Dressing : Set up;Sitting   Lower Body Dressing: Set up;Minimal assistance;Sitting/lateral leans   Toilet Transfer: Contact guard assist;Minimal assistance;Rolling walker (2 wheels);Ambulation;Stand-pivot Toilet Transfer Details (indicate cue type and reason): Simulated via EOB to chair with RW and ambulation in room. Toileting- Clothing Manipulation and Hygiene: Set up;Contact guard assist;Sitting/lateral lean       Functional mobility during ADLs: Contact guard assist;Minimal assistance;Rolling walker (2 wheels) General ADL Comments: Able to ambulate ~2 to 3 steps forward and backward with RW.     Vision Baseline Vision/History: 0 No visual deficits (history of cataracts removal) Ability to See in Adequate Light: 0 Adequate Patient Visual Report: No change from baseline Vision Assessment?: No apparent visual deficits     Perception Perception: Not tested       Praxis Praxis: Not tested       Pertinent Vitals/Pain Pain Assessment Pain Assessment: No/denies pain     Extremity/Trunk Assessment Upper Extremity Assessment Upper Extremity Assessment: Generalized weakness   Lower Extremity Assessment Lower Extremity Assessment: Defer to PT evaluation   Cervical / Trunk Assessment Cervical / Trunk Assessment: Kyphotic   Communication Communication Communication: No apparent difficulties   Cognition Arousal: Alert Behavior During Therapy: WFL for tasks assessed/performed Overall Cognitive Status: Within Functional Limits for tasks assessed                                                        Home Living  Family/patient expects to be discharged to:: Private residence Living Arrangements: Spouse/significant other Available Help at Discharge: Family Type of Home: House Home Access: Stairs to enter Secretary/administrator of Steps: 6 Entrance Stairs-Rails: Left;Right Home Layout: One level     Bathroom Shower/Tub: Producer, television/film/video: Standard     Home Equipment: Grab bars - tub/shower;Shower seat - built Charity fundraiser (2 wheels);BSC/3in1;Hand held shower head   Additional Comments: Reports living in the same place as previous living history on file.      Prior Functioning/Environment Prior Level of Function : Needs assist       Physical Assist : ADLs (physical)   ADLs (physical): IADLs Mobility Comments: [place] and short distance community ambulator without AD. ADLs Comments: Independent ADL; assist IADL        OT Problem List: Decreased strength;Decreased activity tolerance;Cardiopulmonary status limiting activity      OT Treatment/Interventions: Self-care/ADL training;Therapeutic exercise;Therapeutic activities;Patient/family education    OT Goals(Current goals  can be found in the care plan section) Acute Rehab OT Goals Patient Stated Goal: return home OT Goal Formulation: With patient Time For Goal Achievement: 10/01/23 Potential to Achieve Goals: Good  OT Frequency: Min 2X/week    Co-evaluation PT/OT/SLP Co-Evaluation/Treatment: Yes Reason for Co-Treatment: To address functional/ADL transfers   OT goals addressed during session: ADL's and self-care                       End of Session Equipment Utilized During Treatment: Rolling walker (2 wheels);Oxygen Nurse Communication: Mobility status  Activity Tolerance: Patient tolerated treatment well Patient left: in chair;with call bell/phone within reach;with family/visitor present  OT Visit Diagnosis: Unsteadiness on feet (R26.81);Other abnormalities of gait and mobility  (R26.89);Muscle weakness (generalized) (M62.81)                Time: 1610-9604 OT Time Calculation (min): 18 min Charges:  OT General Charges $OT Visit: 1 Visit OT Evaluation $OT Eval Low Complexity: 1 Low  Tim Day OT, MOT  Tim Day 09/17/2023, 9:23 AM

## 2023-09-17 NOTE — Progress Notes (Addendum)
Rounding Note    Patient Name: Tim Day Date of Encounter: 09/17/2023  [facility] Cardiologist: Rollene Rotunda, MD   Subjective   Breathing slightly improved today.   Inpatient Medications    Scheduled Meds:  apixaban  5 mg Oral BID   buPROPion  150 mg Oral Daily   Chlorhexidine Gluconate Cloth  6 each Topical Daily   dapagliflozin propanediol  10 mg Oral Daily   famotidine  20 mg Oral Daily   fluticasone furoate-vilanterol  1 puff Inhalation Daily   furosemide  60 mg Intravenous BID   insulin aspart  0-15 Units Subcutaneous TID WC   insulin aspart  0-5 Units Subcutaneous QHS   insulin glargine-yfgn  10 Units Subcutaneous Daily   ipratropium-albuterol  3 mL Nebulization TID   rosuvastatin  5 mg Oral QHS   sertraline  100 mg Oral Daily   sodium chloride flush  10-40 mL Intracatheter Q12H   Continuous Infusions:  DOBUTamine 2.5 mcg/kg/min (09/17/23 0334)   PRN Meds: acetaminophen **OR** acetaminophen, ALPRAZolam, ipratropium-albuterol, morphine injection, ondansetron **OR** ondansetron (ZOFRAN) IV, oxyCODONE, sodium chloride flush   Vital Signs    Vitals:   09/17/23 0530 09/17/23 0600 09/17/23 0700 09/17/23 0715  BP: (!) 88/68 (!) 87/65 (!) 82/65 (!) 87/69  Pulse: 85 84 83 81  Resp: (!) 22 19 20 20   Temp:      TempSrc:      SpO2: 94% 95% 95% 96%  Weight:      Height:        Intake/Output Summary (Last 24 hours) at 09/17/2023 0806 Last data filed at 09/17/2023 0334 Gross per 24 hour  Intake 257.77 ml  Output 1700 ml  Net -1442.23 ml      09/17/2023    5:00 AM 09/17/2023    3:28 AM 09/16/2023    3:56 AM  Last 3 Weights  Weight (lbs) 193 lb 5.5 oz 193 lb 9 oz 195 lb 1.7 oz  Weight (kg) 87.7 kg 87.8 kg 88.5 kg      Telemetry    NSR - Personally Reviewed  ECG    N/a - Personally Reviewed  Physical Exam   GEN: No acute distress.   Neck: +JVD Cardiac: RRR, no murmurs, rubs, or gallops.  Respiratory: Clear to  auscultation bilaterally. GI: Soft, nontender, non-distended  MS: 2+ bilateral LE edema Neuro:  Nonfocal  Psych: Normal affect   Labs    High Sensitivity Troponin:   Recent Labs  Lab 09/14/23 1942  TROPONINIHS 12     Chemistry Recent Labs  Lab 09/14/23 1942 09/14/23 1942 09/15/23 0445 09/16/23 0349 09/16/23 1509 09/17/23 0503  NA 132*  --  133* 132*  --  131*  K 4.0  --  3.7 4.0  --  4.2  CL 96*  --  95* 93*  --  93*  CO2 26  --  26 27  --  28  GLUCOSE 151*  --  164* 130*  --  187*  BUN 46*  --  45* 50*  --  51*  CREATININE 1.48*  --  1.34* 1.47*  --  1.59*  CALCIUM 8.4*  --  8.2* 8.4*  --  8.1*  MG  --    < > 2.4 2.6* 2.4 2.6*  PROT 6.8  --  6.5  --   --  6.3*  ALBUMIN 3.5  --  3.2* 3.5  --  3.1*  AST 61*  --  70*  --   --  67*  ALT 73*  --  78*  --   --  98*  ALKPHOS 82  --  80  --   --  87  BILITOT 0.8  --  0.7  --   --  0.7  GFRNONAA 51*  --  57* 51*  --  46*  ANIONGAP 10  --  12 12  --  10   < > = values in this interval not displayed.    Lipids No results for input(s): "CHOL", "TRIG", "HDL", "LABVLDL", "LDLCALC", "CHOLHDL" in the last 168 hours.  Hematology Recent Labs  Lab 09/15/23 0445 09/16/23 0349 09/17/23 0503  WBC 9.0 11.1* 9.1  RBC 5.86* 6.23* 5.90*  HGB 16.5 17.0 16.2  HCT 52.5* 56.8* 53.9*  MCV 89.6 91.2 91.4  MCH 28.2 27.3 27.5  MCHC 31.4 29.9* 30.1  RDW 16.3* 17.0* 16.9*  PLT 221 262 194   Thyroid  Recent Labs  Lab 09/15/23 0445  TSH 1.636    BNP Recent Labs  Lab 09/14/23 1942  BNP 668.0*    DDimer No results for input(s): "DDIMER" in the last 168 hours.   Radiology    [place] EKG SITE RITE  Result Date: 09/16/2023 If Site Rite image not attached, placement could not be confirmed due to current cardiac rhythm.  ECHOCARDIOGRAM COMPLETE  Result Date: 09/15/2023    ECHOCARDIOGRAM REPORT   Patient Name:   Tim Day Date of Exam: 09/15/2023 Medical Rec #:  [id number]            Height:       70.0 in Accession #:     [id number]           Weight:       175.3 lb Date of Birth:  May 25, 1951            BSA:          1.973 m Patient Age:    72 years             BP:           104/87 mmHg Patient Gender: M                    HR:           75 bpm. [facility] Procedure: 2D Echo, Cardiac Doppler, Color Doppler and Intracardiac            Opacification Agent REPORT CONTAINS CRITICAL RESULT Indications:    I50.40* Unspecified combined systolic (congestive) and diastolic                 (congestive) heart failure  History:        Patient has prior history of Echocardiogram examinations, most                 recent 09/19/2022. CHF, Pulmonary HTN, Signs/Symptoms:Dyspnea,                 Shortness of Breath and Edema; Risk Factors:Dyslipidemia,                 Diabetes and Hypertension. Cardiac shock. Pneumothorax (left).  Sonographer:    Sheralyn Boatman RDCS Referring Phys: [id number] ASIA B ZIERLE-GHOSH  Sonographer Comments: Technically difficult study due to poor echo windows. IMPRESSIONS  1. Left ventricular ejection fraction, by estimation, is 50%. The left ventricle has low normal function. Left ventricular endocardial border not optimally defined to evaluate regional wall motion. Left ventricular diastolic parameters  are indeterminate. There is the interventricular septum is flattened in systole and diastole, consistent with right ventricular pressure and volume overload.  2. Right ventricular systolic function is severely reduced. The right ventricular size is severely enlarged. There is severely elevated pulmonary artery systolic pressure.  3. The mitral valve is normal in structure. No evidence of mitral valve regurgitation. No evidence of mitral stenosis.  4. The tricuspid valve is abnormal. Tricuspid valve regurgitation is mild to moderate.  5. Aortic valve limited visualization. Grossly no significant stenosis or regurgitation. . The aortic valve was not well visualized. There is moderate calcification of the aortic valve.  There is moderate thickening of the aortic valve. Aortic valve regurgitation is not visualized. No aortic stenosis is present.  6. The inferior vena cava is dilated in size with <50% respiratory variability, suggesting right atrial pressure of 15 mmHg. FINDINGS  Left Ventricle: Left ventricular ejection fraction, by estimation, is 50%. The left ventricle has low normal function. Left ventricular endocardial border not optimally defined to evaluate regional wall motion. Definity contrast agent was given IV to delineate the left ventricular endocardial borders. The left ventricular internal cavity size was normal in size. There is no left ventricular hypertrophy. The interventricular septum is flattened in systole and diastole, consistent with right ventricular pressure and volume overload. Left ventricular diastolic parameters are indeterminate. Right Ventricle: The right ventricular size is severely enlarged. Right vetricular wall thickness was not well visualized. Right ventricular systolic function is severely reduced. There is severely elevated pulmonary artery systolic pressure. The tricuspid regurgitant velocity is 3.56 m/s, and with an assumed right atrial pressure of 15 mmHg, the estimated right ventricular systolic pressure is 65.7 mmHg. Left Atrium: Left atrial size was not well visualized. Right Atrium: Right atrial size was not well visualized. Pericardium: There is no evidence of pericardial effusion. Mitral Valve: The mitral valve is normal in structure. No evidence of mitral valve regurgitation. No evidence of mitral valve stenosis. Tricuspid Valve: The tricuspid valve is abnormal. Tricuspid valve regurgitation is mild to moderate. No evidence of tricuspid stenosis. Aortic Valve: Aortic valve limited visualization. Grossly no significant stenosis or regurgitation. The aortic valve was not well visualized. There is moderate calcification of the aortic valve. There is moderate thickening of the aortic  valve. There is moderate aortic valve annular calcification. Aortic valve regurgitation is not visualized. No aortic stenosis is present. Pulmonic Valve: The pulmonic valve was not well visualized. Pulmonic valve regurgitation is not visualized. No evidence of pulmonic stenosis. Aorta: The aortic root is normal in size and structure. Venous: The inferior vena cava is dilated in size with less than 50% respiratory variability, suggesting right atrial pressure of 15 mmHg. IAS/Shunts: No atrial level shunt detected by color flow Doppler.  LEFT VENTRICLE PLAX 2D LVIDd:         3.60 cm   Diastology LVIDs:         2.80 cm   LV e' medial:    4.68 cm/s LV PW:         0.90 cm   LV E/e' medial:  12.5 LV IVS:        0.90 cm   LV e' lateral:   5.77 cm/s LVOT diam:     2.40 cm   LV E/e' lateral: 10.1 LV SV:         36 LV SV Index:   18 LVOT Area:     4.52 cm  RIGHT VENTRICLE  IVC RV S prime:     6.34 cm/s  IVC diam: 2.90 cm TAPSE (M-mode): 0.9 cm LEFT ATRIUM           Index       RIGHT ATRIUM           Index LA diam:      2.20 cm 1.11 cm/m  RA Area:     19.60 cm LA Vol (A4C): 15.0 ml 7.60 ml/m  RA Volume:   59.50 ml  30.15 ml/m  AORTIC VALVE LVOT Vmax:   48.00 cm/s LVOT Vmean:  30.000 cm/s LVOT VTI:    0.080 m  AORTA Ao Root diam: 3.70 cm MITRAL VALVE               TRICUSPID VALVE MV Area (PHT): 6.96 cm    TR Peak grad:   50.7 mmHg MV Decel Time: 109 msec    TR Vmax:        356.00 cm/s MV E velocity: 58.50 cm/s MV A velocity: 41.60 cm/s  SHUNTS MV E/A ratio:  1.41        Systemic VTI:  0.08 m                            Systemic Diam: 2.40 cm Dina Rich MD Electronically signed by Dina Rich MD Signature Date/Time: 09/15/2023/3:59:02 PM    Final     Cardiac Studies    Patient Profile     DARRNELL KIPPS is a 72 y.o. male with a hx of chronic diastolic congestive heart failure, pulmonary hypertension, hypertension, previous DVT, chronic respiratory failure with hypoxia, pulmonary fibrosis,  GERD, type 2 diabetes, who is being seen 09/16/2023 for the evaluation of heart failure exacerbation at the request of Dr. Alanda Slim.   Assessment & Plan    1.Acute on chronic RV Failure/Cor pulmonale/HFpEF -09/2023 echo: LVEF 50%, indet diastolic function, D shaped septum with RV pressure and volume overload, severe RV enlargement and RV dysfunction, severe pulm HTN PASP 67  -last several echo studies reviewed. Longstanding RV enlargement and dysfunction that has progressed overtime. This admit severe RV enlargement and dysfunction, severe pulm HTN. Presented severely volume overloaded.  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 This is likely primarily group III pulmonary HTN/cor pulmonale, perhaps smaller component of group II.   -initially slow diuresis and soft bp's.  - PICC line placed, COOX 55%. Started on dobutamine 2.5, COOX up to 67%. CVP 18. MAPs 65-70 - negative 1.4 L yesterday, 2.7 L since admission. Received IV lasix 40mg  x 2 and 60mg  x 1 yesterday. Slight uptrend in Cr to 1.59. - remains severely volume overloaded, continue IV diuresis today.   --discuss with primary team transfer to [facility] for HF and pulmonary team evaluations. - Dr Gala Romney on HF team aware of patient and will see at [facility]. Will ask for transfer to medicine team    2.AKI - Cr 1 year ago 1-1.2, admitted at 1.48    3. Chronic respiratory failure/ Post covid pulmonary fibrosis - followed by pulmonary as outpatient.  - on 5L O2 at home chronically    4.History of DVT - has been on eliquis  For questions or updates, please contact [facility] Please consult [email or web address] for contact info under        Signed, Dina Rich, MD  09/17/2023, 8:06  AM

## 2023-09-17 NOTE — Evaluation (Signed)
Physical Therapy Evaluation Patient Details Name: Tim Day MRN: [id number] DOB: 09/13/1951 Today's Date: 09/17/2023  History of Present Illness  Tim Day is a 72 y.o. male with medical history significant of chronic respiratory failure on 4-5 L nasal cannula at home, history of DVT, hypertension, hyperlipidemia, diabetes mellitus, general decline since having a 72-day hospital stay from COVID several years ago, and more presents the ED with a chief complaint of fluid overload.  Patient is on his normal nighttime BiPAP.  History is taken from wife.  She reports that last week patient had terrible scrotal pain.  They are using ice packs.  He called his PCP who prescribed Doxy.  He took Doxy for a few days, but still was not any better so then went into see the PCP.  PCP changed his prescription to Cipro.  He was also advised to increase his Lasix from 40 mg p.o. daily to 60 mg p.o. daily.  They did that, and it brought the scrotal swelling down, but he continued to have peripheral edema.  He is also had dyspnea.  Patient does have dyspnea at baseline since having COVID.  Recently though he has been more tachypneic, more winded with exertion.  He has to turn his oxygen tank up just to walk to the bathroom.  Wife reports this worsening in his breathing is been present since Wednesday, approximately 5 days.  He does have orthopnea.  He has not complained of any chest pain.  He is compliant with his BiPAP at home at night per wife.  She reports she seen his oxygen sats dropped down to the 70s with exertion.  He wears his pulse ox almost 24/7.  They have no other complaints at this time.   Clinical Impression  Patient demonstrates slow labored movement for sitting up at bedside, required increased time for completing sit to stands due mostly to fatigue and mild difficulty breathing, limited to a few steps at bedside due to SpO2 dropping to 80% with exertion and tolerated sitting up in chair  after therapy with family member present.  Patient will benefit from continued skilled physical therapy in hospital and recommended venue below to increase strength, balance, endurance for safe ADLs and gait.          If plan is discharge home, recommend the following: A little help with walking and/or transfers;A little help with bathing/dressing/bathroom;Help with stairs or ramp for entrance;Assistance with cooking/housework   Can travel by private vehicle        Equipment Recommendations None recommended by PT  Recommendations for Other Services       Functional Status Assessment Patient has had a recent decline in their functional status and demonstrates the ability to make significant improvements in function in a reasonable and predictable amount of time.     Precautions / Restrictions Precautions Precautions: Fall Restrictions Weight Bearing Restrictions: No      Mobility  Bed Mobility Overal bed mobility: Needs Assistance Bed Mobility: Supine to Sit     Supine to sit: Min assist     General bed mobility comments: increased time, labored movement    Transfers Overall transfer level: Needs assistance Equipment used: Rolling walker (2 wheels) Transfers: Sit to/from Stand, Bed to chair/wheelchair/BSC Sit to Stand: Contact guard assist, Min assist   Step pivot transfers: Contact guard assist       General transfer comment: Had most difficulty completing sit to stands due to weakness    Ambulation/Gait Ambulation/Gait assistance: Contact  guard assist, Min assist Gait Distance (Feet): 6 Feet Assistive device: Rolling walker (2 wheels) Gait Pattern/deviations: Decreased step length - right, Decreased step length - left, Decreased stride length, Trunk flexed Gait velocity: slow     General Gait Details: limited to a few steps at bedside mostly due to faigue and SpO2 dropping to 82% with exertion  Stairs            Wheelchair Mobility     Tilt  Bed    Modified Rankin (Stroke Patients Only)       Balance Overall balance assessment: Needs assistance Sitting-balance support: Feet supported, No upper extremity supported Sitting balance-Leahy Scale: Fair Sitting balance - Comments: fair/good seated at EOB   Standing balance support: Bilateral upper extremity supported, During functional activity Standing balance-Leahy Scale: Fair Standing balance comment: Using RW                             Pertinent Vitals/Pain Pain Assessment Pain Assessment: No/denies pain    Home Living Family/patient expects to be discharged to:: Private residence Living Arrangements: Spouse/significant other Available Help at Discharge: Family Type of Home: House Home Access: Stairs to enter Entrance Stairs-Rails: Lawyer of Steps: 6   Home Layout: One level Home Equipment: Grab bars - tub/shower;Shower seat - built Charity fundraiser (2 wheels);BSC/3in1;Hand held shower head Additional Comments: Reports living in the same place as previous living history on file.    Prior Function Prior Level of Function : Needs assist       Physical Assist : Mobility (physical);ADLs (physical) Mobility (physical): Bed mobility;Transfers;Gait;Stairs ADLs (physical): IADLs Mobility Comments: [place] and short distance community ambulator without AD. ADLs Comments: Independent ADL; assist IADL     Extremity/Trunk Assessment   Upper Extremity Assessment Upper Extremity Assessment: Defer to OT evaluation    Lower Extremity Assessment Lower Extremity Assessment: Generalized weakness    Cervical / Trunk Assessment Cervical / Trunk Assessment: Kyphotic  Communication   Communication Communication: No apparent difficulties  Cognition Arousal: Alert Behavior During Therapy: WFL for tasks assessed/performed Overall Cognitive Status: Within Functional Limits for tasks assessed                                           General Comments      Exercises     Assessment/Plan    PT Assessment Patient needs continued PT services  PT Problem List Decreased strength;Decreased activity tolerance;Decreased mobility;Decreased balance       PT Treatment Interventions DME instruction;Gait training;Stair training;Functional mobility training;Therapeutic activities;Therapeutic exercise;Balance training;Patient/family education    PT Goals (Current goals can be found in the Care Plan section)  Acute Rehab PT Goals Patient Stated Goal: return home with family to assist PT Goal Formulation: With patient/family Time For Goal Achievement: 09/24/23 Potential to Achieve Goals: Good    Frequency Min 3X/week     Co-evaluation PT/OT/SLP Co-Evaluation/Treatment: Yes Reason for Co-Treatment: To address functional/ADL transfers PT goals addressed during session: Mobility/safety with mobility;Balance;Proper use of DME OT goals addressed during session: ADL's and self-care       AM-PAC PT "6 Clicks" Mobility  Outcome Measure Help needed turning from your back to your side while in a flat bed without using bedrails?: A Little Help needed moving from lying on your back to sitting on the side of a flat bed without  using bedrails?: A Little Help needed moving to and from a bed to a chair (including a wheelchair)?: A Little Help needed standing up from a chair using your arms (e.g., wheelchair or bedside chair)?: A Little Help needed to walk in hospital room?: A Little Help needed climbing 3-5 steps with a railing? : A Lot 6 Click Score: 17    End of Session Equipment Utilized During Treatment: Oxygen Activity Tolerance: Patient tolerated treatment well Patient left: in chair;with call bell/phone within reach;with family/visitor present Nurse Communication: Mobility status PT Visit Diagnosis: Unsteadiness on feet (R26.81);Other abnormalities of gait and mobility (R26.89);Muscle weakness  (generalized) (M62.81)    Time: 8119-1478 PT Time Calculation (min) (ACUTE ONLY): 22 min   Charges:   PT Evaluation $PT Eval Moderate Complexity: 1 Mod PT Treatments $Therapeutic Activity: 8-22 mins PT General Charges $$ ACUTE PT VISIT: 1 Visit         10:31 AM, 09/17/23 Ocie Bob, MPT Physical Therapist with [facility] [phone] office [phone] mobile phone

## 2023-09-18 DIAGNOSIS — F419 Anxiety disorder, unspecified: Secondary | ICD-10-CM | POA: Diagnosis not present

## 2023-09-18 DIAGNOSIS — J9601 Acute respiratory failure with hypoxia: Secondary | ICD-10-CM

## 2023-09-18 DIAGNOSIS — I5033 Acute on chronic diastolic (congestive) heart failure: Secondary | ICD-10-CM | POA: Diagnosis not present

## 2023-09-18 DIAGNOSIS — E1169 Type 2 diabetes mellitus with other specified complication: Secondary | ICD-10-CM

## 2023-09-18 DIAGNOSIS — I272 Pulmonary hypertension, unspecified: Secondary | ICD-10-CM

## 2023-09-18 DIAGNOSIS — E785 Hyperlipidemia, unspecified: Secondary | ICD-10-CM

## 2023-09-18 DIAGNOSIS — N179 Acute kidney failure, unspecified: Secondary | ICD-10-CM | POA: Diagnosis not present

## 2023-09-18 LAB — BASIC METABOLIC PANEL
Anion gap: 9 (ref 5–15)
BUN: 38 mg/dL — ABNORMAL HIGH (ref 8–23)
CO2: 33 mmol/L — ABNORMAL HIGH (ref 22–32)
Calcium: 8 mg/dL — ABNORMAL LOW (ref 8.9–10.3)
Chloride: 91 mmol/L — ABNORMAL LOW (ref 98–111)
Creatinine, Ser: 1.32 mg/dL — ABNORMAL HIGH (ref 0.61–1.24)
GFR, Estimated: 58 mL/min — ABNORMAL LOW (ref >60)
Glucose, Bld: 106 mg/dL — ABNORMAL HIGH (ref 70–99)
Potassium: 3.7 mmol/L (ref 3.5–5.1)
Sodium: 133 mmol/L — ABNORMAL LOW (ref 135–145)

## 2023-09-18 LAB — GLUCOSE, CAPILLARY
Glucose-Capillary: 95 mg/dL (ref 70–99)
Glucose-Capillary: 113 mg/dL — ABNORMAL HIGH (ref 70–99)
Glucose-Capillary: 122 mg/dL — ABNORMAL HIGH (ref 70–99)
Glucose-Capillary: 220 mg/dL — ABNORMAL HIGH (ref 70–99)
Glucose-Capillary: 135 mg/dL — ABNORMAL HIGH (ref 70–99)

## 2023-09-18 LAB — COOXEMETRY PANEL
Carboxyhemoglobin: 0.8 % (ref 0.5–1.5)
Carboxyhemoglobin: 1.3 % (ref 0.5–1.5)
Methemoglobin: 0.7 % (ref 0.0–1.5)
Methemoglobin: <0.7 % (ref 0.0–1.5)
O2 Saturation: 63.1 %
O2 Saturation: 64 %
Total hemoglobin: 16 g/dL (ref 12.0–16.0)
Total hemoglobin: 16.6 g/dL — ABNORMAL HIGH (ref 12.0–16.0)

## 2023-09-18 MED ORDER — INSULIN GLARGINE-YFGN 100 UNIT/ML ~~LOC~~ SOLN
8.0000 [IU] | Freq: Every day | SUBCUTANEOUS | Status: DC
  Administered 2023-09-19 – 2023-09-25 (×7): 8 [IU] via SUBCUTANEOUS
  Filled 2023-09-18 (×7): qty 0.08

## 2023-09-18 MED ORDER — POTASSIUM CHLORIDE CRYS ER 20 MEQ PO TBCR
40.0000 meq | EXTENDED_RELEASE_TABLET | Freq: Every day | ORAL | Status: DC
  Administered 2023-09-18 – 2023-09-25 (×8): 40 meq via ORAL
  Filled 2023-09-18 (×8): qty 2

## 2023-09-18 MED ORDER — METOLAZONE 5 MG PO TABS
5.0000 mg | ORAL_TABLET | Freq: Once | ORAL | Status: AC
  Administered 2023-09-18: 5 mg via ORAL
  Filled 2023-09-18: qty 1

## 2023-09-18 MED ORDER — POTASSIUM CHLORIDE CRYS ER 20 MEQ PO TBCR
40.0000 meq | EXTENDED_RELEASE_TABLET | Freq: Once | ORAL | Status: AC
  Administered 2023-09-18: 40 meq via ORAL
  Filled 2023-09-18: qty 2

## 2023-09-18 MED ORDER — POTASSIUM CHLORIDE CRYS ER 10 MEQ PO TBCR: 10.0000 meq | EXTENDED_RELEASE_TABLET | Freq: Every day | ORAL | Status: DC

## 2023-09-18 MED ORDER — FUROSEMIDE 10 MG/ML IJ SOLN
15.0000 mg/h | INTRAVENOUS | Status: AC
  Administered 2023-09-18 – 2023-09-19 (×3): 20 mg/h via INTRAVENOUS
  Filled 2023-09-18 (×3): qty 20

## 2023-09-18 MED ORDER — INSULIN ASPART 100 UNIT/ML IJ SOLN
0.0000 [IU] | Freq: Three times a day (TID) | INTRAMUSCULAR | Status: DC
  Administered 2023-09-18: 3 [IU] via SUBCUTANEOUS
  Administered 2023-09-19 (×2): 2 [IU] via SUBCUTANEOUS
  Administered 2023-09-19: 9 [IU] via SUBCUTANEOUS
  Administered 2023-09-19: 1 [IU] via SUBCUTANEOUS
  Administered 2023-09-20: 3 [IU] via SUBCUTANEOUS
  Administered 2023-09-20 – 2023-09-22 (×6): 2 [IU] via SUBCUTANEOUS
  Administered 2023-09-22 (×2): 3 [IU] via SUBCUTANEOUS
  Administered 2023-09-23: 1 [IU] via SUBCUTANEOUS
  Administered 2023-09-23: 5 [IU] via SUBCUTANEOUS
  Administered 2023-09-23: 2 [IU] via SUBCUTANEOUS
  Administered 2023-09-24: 1 [IU] via SUBCUTANEOUS
  Administered 2023-09-24 – 2023-09-25 (×3): 2 [IU] via SUBCUTANEOUS
  Administered 2023-09-25: 1 [IU] via SUBCUTANEOUS

## 2023-09-18 MED ORDER — FUROSEMIDE 10 MG/ML IJ SOLN: 80.0000 mg | Freq: Two times a day (BID) | INTRAMUSCULAR | Status: DC

## 2023-09-18 NOTE — Plan of Care (Signed)
Problem: Education: Goal: Ability to demonstrate management of disease process will improve Outcome: Progressing Goal: Ability to verbalize understanding of medication therapies will improve Outcome: Progressing Goal: Individualized Educational Video(s) Outcome: Progressing   Problem: Activity: Goal: Capacity to carry out activities will improve Outcome: Progressing

## 2023-09-18 NOTE — Progress Notes (Signed)
Physical Therapy Treatment Patient Details Name: Tim Day MRN: [id number] DOB: 1951/11/14 Today's Date: 09/18/2023   History of Present Illness 72 y/o male presented to [facility] 09/14/23 with SOB, edema, and overall decline. Admitted with acute on chronic CHF with stage 2 pressure injury on L buttocks. Pt in ICU from 10/13-10/15. Transferred to [facility] on 10/16 with palliative care following and referrals to hospice services. PMH: chronic hypoxia (O2 dependent), DM, HTN, HLD, hx of COVID, COPD, DVT    PT Comments  Pt on BSC upon arrival and agreeable to PT session. Pt was fatigued upon arrival from having BM and sitting on the [facility]. Worked on transfers and balance in today's session. Pt was able to stand with 1 UE support and CGA to maintain balance while staff performed pericare. Pt was able to take a few steps with CGA back towards the bed. Pt was able to sit at EOB while maintaining upright posture for ~10 minutes while cleaning linens, gown, and detangling cords/wires. Pt was limited in today's session due to fatigue and increased RR with activity. PT goals remain appropriate. Acute PT to follow.    If plan is discharge home, recommend the following: A little help with walking and/or transfers;A little help with bathing/dressing/bathroom;Help with stairs or ramp for entrance;Assistance with cooking/housework     Equipment Recommendations  None recommended by PT       Precautions / Restrictions Precautions Precautions: Fall Restrictions Weight Bearing Restrictions: No     Mobility  Bed Mobility Overal bed mobility: Needs Assistance Bed Mobility: Sit to Supine       Sit to supine: Mod assist   General bed mobility comments: ModA for LE management, increased time    Transfers Overall transfer level: Needs assistance Equipment used: None Transfers: Sit to/from Stand Sit to Stand: Contact guard assist        General transfer comment: able to stand w/ no assistance,  1HH upon standing upright. Increased time    Ambulation/Gait Ambulation/Gait assistance: Contact guard assist, Min assist Gait Distance (Feet): 2 Feet Assistive device: 1 person hand held assist Gait Pattern/deviations: Decreased step length - right, Decreased step length - left, Decreased stride length, Trunk flexed Gait velocity: dec     General Gait Details: pt able to take a few steps from [facility] to bed, pt fatigued upon arrival         Balance Overall balance assessment: Needs assistance Sitting-balance support: Feet supported, No upper extremity supported Sitting balance-Leahy Scale: Fair Sitting balance - Comments: able to sit EOB with CGA for safety for ~10 minutes before becoming fatigued   Standing balance support: Single extremity supported, During functional activity Standing balance-Leahy Scale: Poor Standing balance comment: relies on UE support       Cognition Arousal: Alert Behavior During Therapy: WFL for tasks assessed/performed Overall Cognitive Status: Within Functional Limits for tasks assessed      General Comments: difficulty hearing and communicating w/ pt due to Bipap           General Comments General comments (skin integrity, edema, etc.): SpO2 >94% on bipap, pt tachypenic around 35 RR. Cues to slow RR and take deep breaths with little change in RR. Other VSS      Pertinent Vitals/Pain Pain Assessment Pain Assessment: No/denies pain     PT Goals (current goals can now be found in the care plan section) Progress towards PT goals: Progressing toward goals    Frequency    Min 1X/week  Co-evaluation PT/OT/SLP Co-Evaluation/Treatment: Yes Reason for Co-Treatment: Complexity of the patient's impairments (multi-system involvement) PT goals addressed during session: Mobility/safety with mobility;Balance        AM-PAC PT "6 Clicks" Mobility   Outcome Measure  Help needed turning from your back to your side while in a flat  bed without using bedrails?: A Little Help needed moving from lying on your back to sitting on the side of a flat bed without using bedrails?: A Little Help needed moving to and from a bed to a chair (including a wheelchair)?: A Little Help needed standing up from a chair using your arms (e.g., wheelchair or bedside chair)?: A Little Help needed to walk in hospital room?: A Little Help needed climbing 3-5 steps with a railing? : A Lot 6 Click Score: 17    End of Session Equipment Utilized During Treatment: Oxygen Activity Tolerance: Patient limited by fatigue Patient left: in bed;with call bell/phone within reach;with bed alarm set;with family/visitor present Nurse Communication: Mobility status PT Visit Diagnosis: Unsteadiness on feet (R26.81);Other abnormalities of gait and mobility (R26.89);Muscle weakness (generalized) (M62.81)     Time: 0981-1914 PT Time Calculation (min) (ACUTE ONLY): 34 min  Charges:    $Therapeutic Activity: 8-22 mins PT General Charges $$ ACUTE PT VISIT: 1 Visit                     Hilton Cork, PT, DPT Secure Chat Preferred  Rehab Office [phone]    Arturo Morton Brion Aliment 09/18/2023, 5:25 PM

## 2023-09-18 NOTE — Progress Notes (Signed)
Orthopedic Tech Progress Note Patient Details:  Tim Day 1951/06/10 [id number]  Ortho Devices Type of Ortho Device: Radio broadcast assistant Ortho Device/Splint Location: BLE Ortho Device/Splint Interventions: Application, Ordered   Post Interventions Patient Tolerated: Well  Tim Day Tim Day Tim Day 09/18/2023, 4:08 PM

## 2023-09-18 NOTE — Hospital Course (Addendum)
Tim Day was admitted to the hospital with the working diagnosis of heart failure exacerbation.   72 year old M with PMH of diastolic CHF, COPD, chronic hypoxic RF on 4 to 5 L and nightly BiPAP, DM-2, HTN, HLD, DVT on Eliquis, anxiety and depression presenting with shortness of breath and orthopnea, edema and overall decline, and admitted for acute on chronic diastolic CHF.  Overall decline since he had COVID-19 infection many years ago.  His BNP was elevated to 668.  Portable CXR with cardiomegaly, low lung volumes, mild to moderate bibasilar atelectasis and possible infiltrate.  Patient was started on IV Lasix and admitted.   TTE with LVEF of 50%, indeterminate DD, RVSP of 65.7 mmHg, mild to moderate TR. diuresis limited by soft blood pressures.  Cardiology consulted and transferred patient to ICU for inotropes.   10/17 patient on dobutamine infusion and furosemide drip.  10/18 patient responding well to medical therapy.  10/20 improving volume status, and 02 requirements. Start wean off dobutamine.  10/21 worsening dyspnea, continue using Bipap.  High resolution CT with possible pneumonia.  10/22 volume status is improving but continue to use intermittent non invasive mechanical ventilation.

## 2023-09-18 NOTE — Progress Notes (Addendum)
Occupational Therapy Treatment Patient Details Name: Tim Day MRN: [id number] DOB: 1951/01/13 Today's Date: 09/18/2023   History of present illness 72 y/o male presented to [facility] 09/14/23 with SOB, edema, and overall decline. Admitted with acute on chronic CHF with stage 2 pressure injury on L buttocks. Pt in ICU from 10/13-10/15. Transferred to [facility] on 10/16 with palliative care following. PMH: chronic hypoxia (O2 dependent), DM, HTN, HLD, hx of COVID, COPD, DVT   OT comments  Pt sitting on BSC upon arrival and agreeable to participation in skilled rehab session. OT session focused on ADLs with pt demonstrating ability to largely complete UB ADLs with Set up to Min assist, LB ADLs with Contact guard to Min assist, and functional step-pivot transfer from [facility] to bed with Contact guard to Min assist with hand held assist +1. Pt also demonstrated ability to tolerate sitting EOB for > 10 minutes during functional tasks prior to fatiguing. Pt with SpO2 >94% on BiPAP and with pt noted to be tachypneic with RR up to 35. All other VSS throughout session. Pt participated well in session and is making progress toward goals. Pt will benefit from continued acute skilled OT services to address deficits outlined below and increase safety and independence with functional tasks. Post acute discharge, pt will benefit from continued skilled OT services in the home to maximize rehab potential and decrease caregiver burden.       If plan is discharge home, recommend the following:  A little help with walking and/or transfers;A little help with bathing/dressing/bathroom;Assistance with cooking/housework;Assist for transportation;Help with stairs or ramp for entrance   Equipment Recommendations  None recommended by OT (Pt already has needed equipment.)    Recommendations for Other Services      Precautions / Restrictions Precautions Precautions: Fall Restrictions Weight Bearing Restrictions: No        Mobility Bed Mobility Overal bed mobility: Needs Assistance Bed Mobility: Sit to Supine       Sit to supine: Mod assist   General bed mobility comments: ModA for LE management, increased time    Transfers Overall transfer level: Needs assistance Equipment used: None Transfers: Sit to/from Stand, Bed to chair/wheelchair/BSC Sit to Stand: Contact guard assist     Step pivot transfers: Contact guard assist, Min assist (with hand held assist +1)     General transfer comment: able to stand w/ no assistance, 1HH upon standing upright. Increased time     Balance Overall balance assessment: Needs assistance Sitting-balance support: Feet supported, No upper extremity supported Sitting balance-Leahy Scale: Fair Sitting balance - Comments: able to sit EOB with CGA for safety for ~10 minutes before becoming fatigued   Standing balance support: Single extremity supported, During functional activity Standing balance-Leahy Scale: Poor Standing balance comment: reliant on assistance of therapist                           ADL either performed or assessed with clinical judgement   ADL Overall ADL's : Needs assistance/impaired Eating/Feeding: Set up;Sitting               Upper Body Dressing : Contact guard assist;Minimal assistance;Sitting (assist for line management)   Lower Body Dressing: Moderate assistance;Sitting/lateral leans;Sit to/from stand   Toilet Transfer: Contact guard assist;Minimal assistance;Ambulation (step-pivot; hand held assist +1)   Toileting- Clothing Manipulation and Hygiene: Contact guard assist;Moderate assistance;Sit to/from stand (CGA for clothing management and Mod assist for peri-care)  Functional mobility during ADLs: Contact guard assist;Minimal assistance (hand held assist +1)      Extremity/Trunk Assessment Upper Extremity Assessment Upper Extremity Assessment: Right hand dominant;Generalized weakness   Lower  Extremity Assessment Lower Extremity Assessment: Defer to PT evaluation        Vision       Perception     Praxis      Cognition Arousal: Alert Behavior During Therapy: WFL for tasks assessed/performed Overall Cognitive Status: Within Functional Limits for tasks assessed                                 General Comments: difficulty hearing and communicating w/ pt due to Bipap        Exercises      Shoulder Instructions       General Comments SpO2 >94% on bipap, pt tachypenic with RR up to 35. Cues to slow RR and take deep breaths with little change in RR. All other VSS throughout session. [name] and other family memebers present during portions of session.    Pertinent Vitals/ Pain       Pain Assessment Pain Assessment: No/denies pain  Home Living                                          Prior Functioning/Environment              Frequency  Min 1X/week        Progress Toward Goals  OT Goals(current goals can now be found in the care plan section)  Progress towards OT goals: Progressing toward goals  Acute Rehab OT Goals Patient Stated Goal: pt did not state  Plan      Co-evaluation    PT/OT/SLP Co-Evaluation/Treatment: Yes Reason for Co-Treatment: Complexity of the patient's impairments (multi-system involvement) PT goals addressed during session: Mobility/safety with mobility;Balance OT goals addressed during session: ADL's and self-care      AM-PAC OT "6 Clicks" Daily Activity     Outcome Measure   Help from another person eating meals?: A Little Help from another person taking care of personal grooming?: A Little Help from another person toileting, which includes using toliet, bedpan, or urinal?: A Lot Help from another person bathing (including washing, rinsing, drying)?: A Lot Help from another person to put on and taking off regular upper body clothing?: A Little Help from another person to put on  and taking off regular lower body clothing?: A Lot 6 Click Score: 15    End of Session Equipment Utilized During Treatment: Oxygen;Gait belt  OT Visit Diagnosis: Unsteadiness on feet (R26.81);Other abnormalities of gait and mobility (R26.89);Muscle weakness (generalized) (M62.81)   Activity Tolerance Patient tolerated treatment well   Patient Left in bed;with call bell/phone within reach;with family/visitor present   Nurse Communication Mobility status;Other (comment) (Pt urinated x2 and had a BM.)        Time: 6578-4696 OT Time Calculation (min): 34 min  Charges: OT General Charges $OT Visit: 1 Visit OT Treatments $Self Care/Home Management : 8-22 mins  Tim Day "Orson Eva., OTR/L, MA Acute Rehab [phone]   Lendon Colonel 09/18/2023, 5:44 PM

## 2023-09-18 NOTE — Progress Notes (Addendum)
Progress Note   Patient: Tim Day [id number]:[id number] DOB: 1951-07-13 DOA: 09/14/2023     4 DOS: the patient was seen and examined on 09/18/2023   Brief hospital course: Mr. Tim Day was admitted to the hospital with the working diagnosis of heart failure exacerbation.   72 year old M with PMH of diastolic CHF, COPD, chronic hypoxic RF on 4 to 5 L and nightly BiPAP, DM-2, HTN, HLD, DVT on Eliquis, anxiety and depression presenting with shortness of breath and orthopnea, edema and overall decline, and admitted for acute on chronic diastolic CHF.  Overall decline since he had COVID-19 infection many years ago.  His BNP was elevated to 668.  Portable CXR with cardiomegaly, low lung volumes, mild to moderate bibasilar atelectasis and possible infiltrate.  Patient was started on IV Lasix and admitted.   TTE with LVEF of 50%, indeterminate DD, RVSP of 65.7 mmHg, mild to moderate TR. diuresis limited by soft blood pressures.  Cardiology consulted and transferred patient to ICU for inotropes.   10/17 patient on dobutamine infusion and furosemide drip.   Assessment and Plan: * Acute on chronic diastolic CHF (congestive heart failure) (HCC) Echocardiogram with preserved LV systolic function with EF 50%, interventricular septum is flattened in systole and diastole, RV systolic function with severe reduction, mild to moderate TR, RVSP 65.7 mmHg,   Acute on chronic core pulmonale Pulmonary hypertension Right heart failure.   Urine output is 1,750 ml Systolic blood pressure 121 to 103 mmHg.  Plan to continue diuresis with furosemide drip, Metolazone today 5 mg  Inotropic support with dobutamine.  Continue SGLT 2 inh.  Holding on B blockade and RAAS inhibition due to risk of hypotension.   Acute hypoxemic respiratory failure due to acute cardiogenic pulmonary edema and pulmonary hypertension. Continue using intermittent Bipap.   AKI (acute kidney injury) (HCC) Hyponatremia,  Renal  function with serum cr at 1,32 with K at 3,7 and serum bicarbonate at 33. Na 133 Mg 2.6   Plan to continue diuresis with furosemide  K correction to prevent hypokalemia.   Anxiety and depression - Continue Xanax and Wellbutrin  Type 2 diabetes mellitus with hyperlipidemia (HCC) - Sliding scale coverage - Hemoglobin A1c in the a.m. - Monitor CBGs  History of DVT (deep vein thrombosis) - Continue Eliquis        Subjective: Patient with persistent dyspnea, using bipap intermittent during the day.   Physical Exam: Vitals:   09/18/23 0825 09/18/23 1047 09/18/23 1328 09/18/23 1641  BP: 99/71 (!) 121/94  103/77  Pulse:  75 86 82  Resp: 20 (!) 21 (!) 31 (!) 24  Temp: 97.8 F (36.6 C) 97.7 F (36.5 C)  98.4 F (36.9 C)  TempSrc: Oral Oral  Oral  SpO2: 95% 94% 95% 97%  Weight:      Height:       Neurology awake and alert ENT with mild pallor, full face mask bipap in place Cardiovascular with S1 and S2 present, S3 gallop, with systolic murmur at the right lower sternal border,  Positive JVD Positive lower extremity edema ++ unna boots in place Respiratory with rales bilaterally with no wheezing or rhonchi Abdomen with no distention  Data Reviewed:    Family Communication: I spoke with patient's wife at the bedside, we talked in detail about patient's condition, plan of care and prognosis and all questions were addressed.   Disposition: Status is: Inpatient Remains inpatient appropriate because: IV diuresis and inotropic support.   Planned Discharge Destination: Home  Author: Coralie Keens, MD 09/18/2023 5:05 PM  For on call review [email or web address].

## 2023-09-18 NOTE — Assessment & Plan Note (Addendum)
Echocardiogram with preserved LV systolic function with EF 50%, interventricular septum is flattened in systole and diastole, RV systolic function with severe reduction, mild to moderate TR, RVSP 65.7 mmHg,   Acute on chronic core pulmonale Pulmonary hypertension Right heart failure.   Urine output is 3,900  ml Systolic blood pressure 98 to 329 mmHg. SV02 66.8  10/17 Metolazone 5 mg  Continue diuresis with sequential nephron blockade with dapagliflozin, Acetazolamide and torsemide.  Inotropic support with dobutamine 2,5 mcg/ kg/min.   Holding on B blockade and RAAS inhibition due to risk of hypotension.   Acute hypoxemic respiratory failure due to acute cardiogenic pulmonary edema and pulmonary hypertension. Off Bipap  Now on HFNC 5 L/min with 02 saturation 98%.

## 2023-09-18 NOTE — Inpatient Diabetes Management (Signed)
Inpatient Diabetes Program Recommendations  AACE/ADA: New Consensus Statement on Inpatient Glycemic Control (2015)  Target Ranges:  Prepandial:   less than 140 mg/dL      Peak postprandial:   less than 180 mg/dL (1-2 hours)      Critically ill patients:  140 - 180 mg/dL   Lab Results  Component Value Date   GLUCAP 113 (H) 09/18/2023   HGBA1C 6.8 (H) 09/15/2023    Review of Glycemic Control  Diabetes history: DM2 Outpatient Diabetes medications:  Novolog 2-18 units after supper Synjardy 12.04-999 mg bid Toujeo 22 units Current orders for Inpatient glycemic control:  Semglee 10 units daily Novolog 0-15 units tid with meals and HS Farxiga 10 mg daily   Inpatient Diabetes Program Recommendations:   Noted that CBGs have been less than 70 mg/dl.  Recommend decreasing Novolog to 0-9 units correction scale TID, 0-5 units HS scale if CBGs continue to be low. May need to decrease Semglee to 8 units daily.   Will continue to monitor blood sugars while in the hospital.  Smith Mince RN BSN CDE Diabetes Coordinator Pager: [phone]  8am-5pm

## 2023-09-19 DIAGNOSIS — I5033 Acute on chronic diastolic (congestive) heart failure: Secondary | ICD-10-CM | POA: Diagnosis not present

## 2023-09-19 DIAGNOSIS — F419 Anxiety disorder, unspecified: Secondary | ICD-10-CM | POA: Diagnosis not present

## 2023-09-19 DIAGNOSIS — J9601 Acute respiratory failure with hypoxia: Secondary | ICD-10-CM | POA: Diagnosis not present

## 2023-09-19 DIAGNOSIS — Z7189 Other specified counseling: Secondary | ICD-10-CM | POA: Diagnosis not present

## 2023-09-19 DIAGNOSIS — N179 Acute kidney failure, unspecified: Secondary | ICD-10-CM | POA: Diagnosis not present

## 2023-09-19 DIAGNOSIS — E1169 Type 2 diabetes mellitus with other specified complication: Secondary | ICD-10-CM | POA: Diagnosis not present

## 2023-09-19 DIAGNOSIS — I272 Pulmonary hypertension, unspecified: Secondary | ICD-10-CM | POA: Diagnosis not present

## 2023-09-19 DIAGNOSIS — Z515 Encounter for palliative care: Secondary | ICD-10-CM | POA: Diagnosis not present

## 2023-09-19 LAB — BASIC METABOLIC PANEL
Anion gap: 12 (ref 5–15)
Anion gap: 17 — ABNORMAL HIGH (ref 5–15)
BUN: 36 mg/dL — ABNORMAL HIGH (ref 8–23)
BUN: 37 mg/dL — ABNORMAL HIGH (ref 8–23)
CO2: 42 mmol/L — ABNORMAL HIGH (ref 22–32)
CO2: 37 mmol/L — ABNORMAL HIGH (ref 22–32)
Calcium: 8.4 mg/dL — ABNORMAL LOW (ref 8.9–10.3)
Calcium: 8 mg/dL — ABNORMAL LOW (ref 8.9–10.3)
Chloride: 82 mmol/L — ABNORMAL LOW (ref 98–111)
Chloride: 82 mmol/L — ABNORMAL LOW (ref 98–111)
Creatinine, Ser: 1.3 mg/dL — ABNORMAL HIGH (ref 0.61–1.24)
Creatinine, Ser: 1.43 mg/dL — ABNORMAL HIGH (ref 0.61–1.24)
GFR, Estimated: 59 mL/min — ABNORMAL LOW (ref >60)
GFR, Estimated: 53 mL/min — ABNORMAL LOW (ref >60)
Glucose, Bld: 146 mg/dL — ABNORMAL HIGH (ref 70–99)
Glucose, Bld: 294 mg/dL — ABNORMAL HIGH (ref 70–99)
Potassium: 3.1 mmol/L — ABNORMAL LOW (ref 3.5–5.1)
Potassium: 3.6 mmol/L (ref 3.5–5.1)
Sodium: 136 mmol/L (ref 135–145)
Sodium: 136 mmol/L (ref 135–145)

## 2023-09-19 LAB — CBC
HCT: 53.7 % — ABNORMAL HIGH (ref 39.0–52.0)
Hemoglobin: 16.9 g/dL (ref 13.0–17.0)
MCH: 27.3 pg (ref 26.0–34.0)
MCHC: 31.5 g/dL (ref 30.0–36.0)
MCV: 86.8 fL (ref 80.0–100.0)
Platelets: 192 10*3/uL (ref 150–400)
RBC: 6.19 MIL/uL — ABNORMAL HIGH (ref 4.22–5.81)
RDW: 16.7 % — ABNORMAL HIGH (ref 11.5–15.5)
WBC: 11.9 10*3/uL — ABNORMAL HIGH (ref 4.0–10.5)
nRBC: 0 % (ref 0.0–0.2)

## 2023-09-19 LAB — MAGNESIUM: Magnesium: 2.1 mg/dL (ref 1.7–2.4)

## 2023-09-19 LAB — GLUCOSE, CAPILLARY
Glucose-Capillary: 200 mg/dL — ABNORMAL HIGH (ref 70–99)
Glucose-Capillary: 197 mg/dL — ABNORMAL HIGH (ref 70–99)
Glucose-Capillary: 132 mg/dL — ABNORMAL HIGH (ref 70–99)

## 2023-09-19 MED ORDER — ACETAZOLAMIDE 250 MG PO TABS
250.0000 mg | ORAL_TABLET | Freq: Two times a day (BID) | ORAL | Status: DC
  Administered 2023-09-19 – 2023-09-23 (×9): 250 mg via ORAL
  Filled 2023-09-19 (×9): qty 1

## 2023-09-19 MED ORDER — POLYVINYL ALCOHOL 1.4 % OP SOLN
1.0000 [drp] | OPHTHALMIC | Status: DC | PRN
  Administered 2023-09-19: 1 [drp] via OPHTHALMIC
  Filled 2023-09-19: qty 15

## 2023-09-19 MED ORDER — POTASSIUM CHLORIDE CRYS ER 20 MEQ PO TBCR
40.0000 meq | EXTENDED_RELEASE_TABLET | ORAL | Status: AC
  Administered 2023-09-19: 40 meq via ORAL
  Filled 2023-09-19: qty 2

## 2023-09-19 MED ORDER — POTASSIUM CHLORIDE CRYS ER 20 MEQ PO TBCR: 40.0000 meq | EXTENDED_RELEASE_TABLET | Freq: Once | ORAL | Status: DC

## 2023-09-19 NOTE — Progress Notes (Signed)
Progress Note   Patient: Tim Day HYQ:[id number] DOB: February 28, 1951 DOA: 09/14/2023     5 DOS: the patient was seen and examined on 09/19/2023   Brief hospital course: Tim Day was admitted to the hospital with the working diagnosis of heart failure exacerbation.   72 year old M with PMH of diastolic CHF, COPD, chronic hypoxic RF on 4 to 5 L and nightly BiPAP, DM-2, HTN, HLD, DVT on Eliquis, anxiety and depression presenting with shortness of breath and orthopnea, edema and overall decline, and admitted for acute on chronic diastolic CHF.  Overall decline since he had COVID-19 infection many years ago.  His BNP was elevated to 668.  Portable CXR with cardiomegaly, low lung volumes, mild to moderate bibasilar atelectasis and possible infiltrate.  Patient was started on IV Lasix and admitted.   TTE with LVEF of 50%, indeterminate DD, RVSP of 65.7 mmHg, mild to moderate TR. diuresis limited by soft blood pressures.  Cardiology consulted and transferred patient to ICU for inotropes.   10/17 patient on dobutamine infusion and furosemide drip.  10/18 patient responding well to medical therapy.   Assessment and Plan: * Acute on chronic diastolic CHF (congestive heart failure) (HCC) Echocardiogram with preserved LV systolic function with EF 50%, interventricular septum is flattened in systole and diastole, RV systolic function with severe reduction, mild to moderate TR, RVSP 65.7 mmHg,   Acute on chronic core pulmonale Pulmonary hypertension Right heart failure.   Urine output is 3,775  ml Systolic blood pressure 95 to 952 mmHg.  Plan to continue diuresis with furosemide drip, 10/17 Metolazone 5 mg  Inotropic support with dobutamine.  Continue SGLT 2 inh.  Holding on B blockade and RAAS inhibition due to risk of hypotension.   Acute hypoxemic respiratory failure due to acute cardiogenic pulmonary edema and pulmonary hypertension. He has been transitioned from Bipap to HFNC  currently on 7 L/min with 02 saturation 93%.    AKI (acute kidney injury) (HCC) Hyponatremia, hypokalemia.   Today serum cr is 1,3 with K at 3,1 and serum bicarbonate at 42. Na 136 Mg 2,1   Plan to continue diuresis with furosemide  K correction with Kcl.   Anxiety and depression - Continue Xanax and Wellbutrin  Type 2 diabetes mellitus with hyperlipidemia (HCC) Glucose has been stable  Continue with insulin sliding scale and basal (8 units)  Continue with statin therapy.    History of DVT (deep vein thrombosis) Anticoagulation with apixaban.         Subjective: Patient with improvement in dyspnea but not back to baseline, no chest pain, edema is improving.   Physical Exam: Vitals:   09/19/23 1235 09/19/23 1320 09/19/23 1400 09/19/23 1608  BP: 95/65  104/77 111/78  Pulse:    87  Resp: (!) 32  (!) 23 (!) 30  Temp: 97.7 F (36.5 C)   97.6 F (36.4 C)  TempSrc: Oral   Oral  SpO2:  93%  94%  Weight:      Height:       Neurology awake and alert ENT with mild pallor Cardiovascular with S1 and S2 present and regular with no gallops or rubs.  Mild JVD Positive lower extremity edema + Unna boots in place Respiratory with rales at bases with no wheezing or rhonchi Abdomen with no distention  Data Reviewed:    Family Communication: I spoke with patient's wife at the bedside, we talked in detail about patient's condition, plan of care and prognosis and all questions were addressed.  Disposition: Status is: Inpatient Remains inpatient appropriate because: diuresis and inotropic support.   Planned Discharge Destination: Home   Author: Coralie Keens, MD 09/19/2023 4:09 PM  For on call review [email or web address].

## 2023-09-19 NOTE — TOC Progression Note (Signed)
Transition of Care [facility]) - Progression Note    Patient Details  Name: Tim Day MRN: [id number] Date of Birth: 1951/08/23  Transition of Care [facility] CM/SW Contact  Nicanor Bake Phone Number: [phone] 09/19/2023, 1:53 PM  Clinical Narrative:   HF CSW attempted to meet with pt at bedside. Pt had several family members present. CSW will follow up with pt at a more appropriate time.   TOC will continue following.     Expected Discharge Plan: Home w Home Health Services Barriers to Discharge: Continued Medical Work up  Expected Discharge Plan and Services In-house Referral: Clinical Social Work Discharge Planning Services: CM Consult Post Acute Care Choice: Home Health Living arrangements for the past 2 months: Single Family Home                                       Social Determinants of Health (SDOH) Interventions SDOH Screenings   Food Insecurity: No Food Insecurity (09/15/2023)  Housing: Low Risk  (09/15/2023)  Transportation Needs: No Transportation Needs (09/15/2023)  Utilities: Not At Risk (09/15/2023)  Depression (PHQ2-9): Medium Risk (08/25/2023)  Tobacco Use: Low Risk  (09/15/2023)    Readmission Risk Interventions    09/15/2023   12:03 PM  Readmission Risk Prevention Plan  Transportation Screening Complete  Home Care Screening Complete  Medication Review (RN CM) Complete

## 2023-09-19 NOTE — Progress Notes (Signed)
Daily Progress Note   Patient Name: Tim Day       Date: 09/19/2023 DOB: 09-15-1951  Age: 72 y.o. MRN#: [id number] Attending Physician: Coralie Keens Primary Care Physician: Benita Stabile, MD Admit Date: 09/14/2023  Reason for Consultation/Follow-up: Establishing goals of care  Length of Stay: 5  Current Medications: Scheduled Meds:   acetaZOLAMIDE  250 mg Oral BID   apixaban  5 mg Oral BID   buPROPion  150 mg Oral Daily   Chlorhexidine Gluconate Cloth  6 each Topical Daily   dapagliflozin propanediol  10 mg Oral Daily   famotidine  20 mg Oral Daily   fluticasone furoate-vilanterol  1 puff Inhalation Daily   insulin aspart  0-9 Units Subcutaneous TID WC   insulin glargine-yfgn  8 Units Subcutaneous Daily   ipratropium-albuterol  3 mL Nebulization TID   potassium chloride  40 mEq Oral Daily   rosuvastatin  5 mg Oral QHS   sertraline  100 mg Oral Daily   sodium chloride flush  10-40 mL Intracatheter Q12H    Continuous Infusions:  DOBUTamine 2.5 mcg/kg/min (09/17/23 1431)   furosemide (LASIX) 200 mg in dextrose 5 % 100 mL (2 mg/mL) infusion 20 mg/hr (09/19/23 0109)    PRN Meds: acetaminophen **OR** acetaminophen, ALPRAZolam, ipratropium-albuterol, morphine injection, ondansetron **OR** ondansetron (ZOFRAN) IV, oxyCODONE, sodium chloride flush  Physical Exam Vitals reviewed.  Constitutional:      General: He is awake.     Appearance: He is ill-appearing.     Interventions: Nasal cannula in place.     Comments: Nasal cannula: 7L  HENT:     Head: Normocephalic and atraumatic.  Cardiovascular:     Rate and Rhythm: Normal rate.  Pulmonary:     Effort: Tachypnea present.  Skin:    General: Skin is warm and dry.  Neurological:     Mental Status: He  is oriented to person, place, and time.  Psychiatric:        Mood and Affect: Mood normal.        Behavior: Behavior normal.            Vital Signs: BP (!) 124/90   Pulse 88   Temp 97.7 F (36.5 C) (Oral)   Resp (!) 25   Ht 5\' 10"  (1.778 m)   Wt 80.1  kg   SpO2 95%   BMI 25.34 kg/m  SpO2: SpO2: 95 % O2 Device: O2 Device: Nasal Cannula O2 Flow Rate: O2 Flow Rate (L/min): 7 L/min  Intake/output summary:  Intake/Output Summary (Last 24 hours) at 09/19/2023 1115 Last data filed at 09/19/2023 0900 Gross per 24 hour  Intake 160 ml  Output 4975 ml  Net -4815 ml   LBM: Last BM Date : 09/18/23 Baseline Weight: Weight: 84.4 kg Most recent weight: Weight: 80.1 kg      Patient Active Problem List   Diagnosis Date Noted   History of DVT (deep vein thrombosis) 09/15/2023   Anxiety and depression 09/15/2023   AKI (acute kidney injury) (HCC) 09/15/2023   CHF exacerbation (HCC) 09/14/2023   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   Pulmonary HTN (HCC) 05/26/2022   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) 05/26/2022   Pulmonary fibrosis (HCC)    Acute on chronic diastolic CHF (congestive heart failure) (HCC)    Respiratory failure with hypoxia and hypercapnia (HCC) 05/06/2022   DOE (dyspnea on exertion) 04/08/2022   Acute respiratory failure with hypercapnia (HCC) 11/17/2021   Acute metabolic encephalopathy    Hyperglycemia due to diabetes mellitus (HCC)    Viral gastroenteritis    Solitary pulmonary nodule on lung CT 08/02/2021   Elevated troponin I level 07/09/2021   Severe sepsis (HCC) 04/09/2021   Acute cholecystitis 04/09/2021   Shock (HCC)    Acute on chronic respiratory failure with hypoxia (HCC)    Chronic respiratory failure with hypoxia (HCC) 02/09/2021   Sinus arrhythmia 12/26/2020   Adjustment disorder with anxious mood 09/15/2020   Protein-calorie malnutrition, severe 08/25/2020   Primary spontaneous pneumothorax    Pneumonia of both  lower lobes due to infectious organism    Pressure injury of skin 08/20/2020   Pneumothorax, left 08/19/2020   Pneumonia due to COVID-19 virus 08/19/2020   Leg DVT (deep venous thromboembolism), acute, bilateral (HCC) 08/19/2020   Sepsis (HCC) 08/08/2020   Hyponatremia 08/08/2020   Hypocalcemia 08/03/2020   Transaminitis 08/03/2020   COVID-19 virus infection 07/22/20 > 02 dep since 08/02/2020   Thrombocytopenia (HCC) 08/02/2020   Leukopenia 08/02/2020   Esophageal reflux    Chest tube in place 01/22/2015   Hypercholesteremia    Hypertension    Cardiogenic shock (HCC) 01/20/2015   Type 2 diabetes mellitus with hyperlipidemia (HCC) 01/20/2015   Benign essential HTN 01/20/2015   Dyslipidemia 01/20/2015    Palliative Care Assessment & Plan   Patient Profile: 72 y.o. male  with past medical history of chronic respiratory failure 4 to 5 L, history of DVT, HTN/HLD, DM2, general decline after 72-day hospital stay for COVID several years ago, GERD, malignant melanoma of the skin admitted on 09/14/2023 with CHF exacerbation.   Today's discussion: Met with patient and his wife at bedside. Patient reports he is feeling a little better today. His wife shares he had a significant amount of urine output since yesterday. Patient reports his breathing has improved-- he is down to 7L O2 by nasal cannula. Patient would like to continue treating the treatable.   Patient faces treatment option decisions, advanced directive decisions, and anticipatory care needs. The patient and his wife seem interested in palliative follow up outpatient.   Discussed the importance of continued conversation with family and the medical providers regarding overall plan of care and treatment options, ensuring decisions are within the context of the patient's values and GOCs.  Questions and concerns were addressed. The  family was encouraged to call with questions or concerns. PMT will continue to support  holistically.  Recommendations/Plan: Full code Full scope Encouraged continued conversations around GOC Considering outpatient palliative follow up   Code Status:    Code Status Orders  (From admission, onward)           Start     Ordered   09/17/23 1553  Do not attempt resuscitation (DNR)- Limited -Do Not Intubate (DNI)  (Code Status)  Continuous       Question Answer Comment  If pulseless and not breathing No CPR or chest compressions.   In Pre-Arrest Conditions (Patient Is Breathing and Has A Pulse) Do not intubate. Provide all appropriate non-invasive medical interventions. Avoid ICU transfer unless indicated or required.   Consent: Discussion documented in EHR or advanced directives reviewed      09/17/23 1552         Extensive chart review has been completed prior to seeing the patient including labs, vital signs, imaging, progress/consult notes, orders, medications, and available advance directive documents.   Time spent: 25 minutes  Thank you for allowing the Palliative Medicine Team to assist in the care of this patient.  Sherryll Burger, NP  Please contact Palliative Medicine Team phone at [phone] for questions and concerns.

## 2023-09-19 NOTE — Progress Notes (Addendum)
Advanced Heart Failure Rounding Note  PCP-Cardiologist: Rollene Rotunda, MD   Subjective:    On DBA 2.5 +  Lasix gtt at 20/hr. Also received metolazone yesterday   Co-ox not checked this morning. Good urinary response thus far. > 3L in UOP yesterday, 3.1L charted + 3 unmeasured urinary occurences. Wt down 15 lb. CVP 8.   Scr down and stable, 1.59>>1.32>>1.30. CO2 42 K 3.1  Mg 2.1   WBC 12K but AF. Continues on supp O2. SOB w/ activity. Just moved from bedside commode back to bed and feels winded. Appetite not great. Complains of abdominal fullness/ early satiety    Family present at bedside.   Objective:   Weight Range: 80.1 kg Body mass index is 25.34 kg/m.   Vital Signs:   Temp:  [97.4 F (36.3 C)-98.4 F (36.9 C)] 97.7 F (36.5 C) (10/18 0804) Pulse Rate:  [75-93] 88 (10/18 0431) Resp:  [20-31] 25 (10/18 0804) BP: (103-125)/(72-94) 124/90 (10/18 0804) SpO2:  [94 %-100 %] 95 % (10/18 0730) FiO2 (%):  [40 %] 40 % (10/18 0037) Weight:  [80.1 kg] 80.1 kg (10/18 0431) Last BM Date : 09/18/23  Weight change: Filed Weights   09/17/23 1734 09/18/23 0446 09/19/23 0431  Weight: 86.8 kg 86.8 kg 80.1 kg    Intake/Output:   Intake/Output Summary (Last 24 hours) at 09/19/2023 0934 Last data filed at 09/19/2023 0900 Gross per 24 hour  Intake 160 ml  Output 4875 ml  Net -4715 ml      Physical Exam    General:  chronically ill appearing, dyspneic after transferring from commode to bed despite supp O2  HEENT: Normal Neck: Supple. JVP elevated to jaw . Carotids 2+ bilat; no bruits. No lymphadenopathy or thyromegaly appreciated. Cor: PMI nondisplaced. Regular rate & rhythm. No rubs, gallops or murmurs. Lungs: decreased BS at the bases  Abdomen: Soft, nontender, +distended. No hepatosplenomegaly. No bruits or masses. Good bowel sounds. Extremities: No cyanosis, clubbing, rash, 1-2+ b/l LEE + unna boots + RUE PICC  Neuro: Alert & orientedx3, cranial nerves grossly  intact. moves all 4 extremities w/o difficulty. Affect pleasant   Telemetry   NSR 90s   EKG    N/a   Labs    CBC Recent Labs    09/17/23 0503 09/19/23 0500  WBC 9.1 11.9*  HGB 16.2 16.9  HCT 53.9* 53.7*  MCV 91.4 86.8  PLT 194 192   Basic Metabolic Panel Recent Labs    40/98/11 0503 09/18/23 0948 09/19/23 0500  NA 131* 133* 136  K 4.2 3.7 3.1*  CL 93* 91* 82*  CO2 28 33* 42*  GLUCOSE 187* 106* 146*  BUN 51* 38* 36*  CREATININE 1.59* 1.32* 1.30*  CALCIUM 8.1* 8.0* 8.4*  MG 2.6*  --  2.1   Liver Function Tests Recent Labs    09/17/23 0503  AST 67*  ALT 98*  ALKPHOS 87  BILITOT 0.7  PROT 6.3*  ALBUMIN 3.1*   No results for input(s): "LIPASE", "AMYLASE" in the last 72 hours. Cardiac Enzymes No results for input(s): "CKTOTAL", "CKMB", "CKMBINDEX", "TROPONINI" in the last 72 hours.  BNP: BNP (last 3 results) Recent Labs    09/14/23 1942  BNP 668.0*    ProBNP (last 3 results) No results for input(s): "PROBNP" in the last 8760 hours.   D-Dimer No results for input(s): "DDIMER" in the last 72 hours. Hemoglobin A1C No results for input(s): "HGBA1C" in the last 72 hours. Fasting Lipid Panel No results  for input(s): "CHOL", "HDL", "LDLCALC", "TRIG", "CHOLHDL", "LDLDIRECT" in the last 72 hours. Thyroid Function Tests No results for input(s): "TSH", "T4TOTAL", "T3FREE", "THYROIDAB" in the last 72 hours.  Invalid input(s): "FREET3"  Other results:   Imaging    No results found.   Medications:     Scheduled Medications:  apixaban  5 mg Oral BID   buPROPion  150 mg Oral Daily   Chlorhexidine Gluconate Cloth  6 each Topical Daily   dapagliflozin propanediol  10 mg Oral Daily   famotidine  20 mg Oral Daily   fluticasone furoate-vilanterol  1 puff Inhalation Daily   insulin aspart  0-9 Units Subcutaneous TID WC   insulin glargine-yfgn  8 Units Subcutaneous Daily   ipratropium-albuterol  3 mL Nebulization TID   potassium chloride  40 mEq  Oral Daily   rosuvastatin  5 mg Oral QHS   sertraline  100 mg Oral Daily   sodium chloride flush  10-40 mL Intracatheter Q12H    Infusions:  DOBUTamine 2.5 mcg/kg/min (09/17/23 1431)   furosemide (LASIX) 200 mg in dextrose 5 % 100 mL (2 mg/mL) infusion 20 mg/hr (09/19/23 8295)    PRN Medications: acetaminophen **OR** acetaminophen, ALPRAZolam, ipratropium-albuterol, morphine injection, ondansetron **OR** ondansetron (ZOFRAN) IV, oxyCODONE, sodium chloride flush    Patient Profile   Mr Tim Day is a 72 year old with a history of  PAH, HTN, DVT, chronic respiratory failure on home oxygen, post COVID pulmonary fibrosis, GERD, DMII, DVT, and diastolic heart failure.    Admitted with A/C HFpEF .  Now with severely reduced RV. Transfer to [facility] for Advanced Heart Failure Team consultation/management, attempt at Palliative diuresis.   Assessment/Plan   1. 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  - Echo this admit  LVEF preserved and RV severely reduced - P/w NYHA Class IV symptoms and marked volume overload  - on DBA 2.5 + Lasix gtt at 20/hr w/ good urinary response. Wt and CVP downtrending but c/w volume overload. Scr stable  - continue current therapies and diuresis w/ lasix gtt through today.  - No metolazone today. W/ elevated CO2 (42) will add Diamox 250 mg bid  - Wait to wean DBA until fully diuresed    - Supp K. F/u BMP in PM  - Continue farxiga 10 mg   - Continue unna boots  - fluid restrict 1800 mL/day    2. Acute Hypoxic Respiratory Failure -Oxygen requirements increased on admit 13 liters-->weaned to 7 liters.  - At home he is on 5 liter [place].  - Continue diuresis    3.  Pulmonary HTN  -Has history of ILD on chronic oxygen + OSA. Chronic respiratory failure on 5 liters oxygen.  - Suspect WHO Group III but needs additional work up.  - Continue Supp O2  - Will need [facility]HC to further assess.     4. AKI -Creatinine 1.3>1.6 >1.3>1.3 today   -Follow BMET    5. H/O DVT -US DVT RLE 2023  -On eliquis chronically, 5 mg bid     6. GOC  - DNR/ DNI  - Palliative Care following.  - Discussed if we are unable to stabilize he would need Hospice. He is agreeable.    Length of Stay: [address] PA-C  09/19/2023, 9:34 AM  Advanced Heart Failure Team Pager [phone] (M-F; 7a - 5p)  Please contact [facility] for night-coverage after hours (5p -7a ) and weekends on [email or web address]  Patient  seen and examined with the above-signed Advanced Practice Provider and/or Housestaff. I personally reviewed laboratory data, imaging studies and relevant notes. I independently examined the patient and formulated the important aspects of the plan. I have edited the note to reflect any of my changes or salient points. I have personally discussed the plan with the patient and/or family.  Remains on DBA and lasix gtt.   Diuresing briskly. Much less bloated but having cramps and feels uncomfortable. Didn't sleep well. CVP 6-8. No coox today   General:  Chronically appearing. No resp difficulty HEENT: normal Neck: supple.JVP 6-7 . Cor: reg 2/6 TR Lungs: + crackles Abdomen: soft, nontender, nondistended.  Extremities: no cyanosis, clubbing, rash, tr-1+ edema + UNNA Neuro: alert & orientedx3, cranial nerves grossly intact. moves all 4 extremities w/o difficulty. Affect pleasant  Volume status much improved. Need to be careful not to overdiurese with RV failure/ Will decrease lasix gtt and stop when this bag finished. Agree with diamox in setting of contraction alkalosis. Supp K.   Will start po diuretics back tomorrow and begin DBA wean.   Continue Palliative support   Arvilla Meres, MD  5:22 PM

## 2023-09-19 NOTE — Plan of Care (Signed)
[facility] Tonsillectomy/Adenoidectomy, Postoperative PEDS care plan entered in error.

## 2023-09-20 DIAGNOSIS — F419 Anxiety disorder, unspecified: Secondary | ICD-10-CM | POA: Diagnosis not present

## 2023-09-20 DIAGNOSIS — Z7189 Other specified counseling: Secondary | ICD-10-CM | POA: Diagnosis not present

## 2023-09-20 DIAGNOSIS — I272 Pulmonary hypertension, unspecified: Secondary | ICD-10-CM | POA: Diagnosis not present

## 2023-09-20 DIAGNOSIS — I5033 Acute on chronic diastolic (congestive) heart failure: Secondary | ICD-10-CM | POA: Diagnosis not present

## 2023-09-20 DIAGNOSIS — N179 Acute kidney failure, unspecified: Secondary | ICD-10-CM | POA: Diagnosis not present

## 2023-09-20 DIAGNOSIS — Z515 Encounter for palliative care: Secondary | ICD-10-CM | POA: Diagnosis not present

## 2023-09-20 DIAGNOSIS — E1169 Type 2 diabetes mellitus with other specified complication: Secondary | ICD-10-CM | POA: Diagnosis not present

## 2023-09-20 DIAGNOSIS — J9601 Acute respiratory failure with hypoxia: Secondary | ICD-10-CM | POA: Diagnosis not present

## 2023-09-20 LAB — BASIC METABOLIC PANEL
Anion gap: 16 — ABNORMAL HIGH (ref 5–15)
BUN: 38 mg/dL — ABNORMAL HIGH (ref 8–23)
CO2: 43 mmol/L — ABNORMAL HIGH (ref 22–32)
Calcium: 9.4 mg/dL (ref 8.9–10.3)
Chloride: 78 mmol/L — ABNORMAL LOW (ref 98–111)
Creatinine, Ser: 1.31 mg/dL — ABNORMAL HIGH (ref 0.61–1.24)
GFR, Estimated: 59 mL/min — ABNORMAL LOW (ref >60)
Glucose, Bld: 158 mg/dL — ABNORMAL HIGH (ref 70–99)
Potassium: 3.7 mmol/L (ref 3.5–5.1)
Sodium: 137 mmol/L (ref 135–145)

## 2023-09-20 LAB — CBC
HCT: 52.1 % — ABNORMAL HIGH (ref 39.0–52.0)
Hemoglobin: 16.2 g/dL (ref 13.0–17.0)
MCH: 27.3 pg (ref 26.0–34.0)
MCHC: 31.1 g/dL (ref 30.0–36.0)
MCV: 87.9 fL (ref 80.0–100.0)
Platelets: 203 10*3/uL (ref 150–400)
RBC: 5.93 MIL/uL — ABNORMAL HIGH (ref 4.22–5.81)
RDW: 16.5 % — ABNORMAL HIGH (ref 11.5–15.5)
WBC: 13.2 10*3/uL — ABNORMAL HIGH (ref 4.0–10.5)
nRBC: 0 % (ref 0.0–0.2)

## 2023-09-20 LAB — GLUCOSE, CAPILLARY
Glucose-Capillary: 355 mg/dL — ABNORMAL HIGH (ref 70–99)
Glucose-Capillary: 166 mg/dL — ABNORMAL HIGH (ref 70–99)
Glucose-Capillary: 208 mg/dL — ABNORMAL HIGH (ref 70–99)
Glucose-Capillary: 160 mg/dL — ABNORMAL HIGH (ref 70–99)
Glucose-Capillary: 206 mg/dL — ABNORMAL HIGH (ref 70–99)

## 2023-09-20 LAB — COOXEMETRY PANEL
Carboxyhemoglobin: 1.6 % — ABNORMAL HIGH (ref 0.5–1.5)
Methemoglobin: <0.7 % (ref 0.0–1.5)
O2 Saturation: 66.8 %
Total hemoglobin: 17.1 g/dL — ABNORMAL HIGH (ref 12.0–16.0)

## 2023-09-20 LAB — MAGNESIUM: Magnesium: 2.4 mg/dL (ref 1.7–2.4)

## 2023-09-20 MED ORDER — TORSEMIDE 20 MG PO TABS
40.0000 mg | ORAL_TABLET | Freq: Once | ORAL | Status: AC
  Administered 2023-09-20: 40 mg via ORAL
  Filled 2023-09-20: qty 2

## 2023-09-20 MED ORDER — TORSEMIDE 20 MG PO TABS
40.0000 mg | ORAL_TABLET | Freq: Two times a day (BID) | ORAL | Status: DC
  Administered 2023-09-20 – 2023-09-22 (×5): 40 mg via ORAL
  Filled 2023-09-20 (×4): qty 2

## 2023-09-20 NOTE — Plan of Care (Signed)
Problem: Education: Goal: Ability to demonstrate management of disease process will improve 09/20/2023 0448 by Broderick Fonseca, Lucienne Capers, RN Outcome: Progressing 09/20/2023 0448 by Suyash Amory, Lucienne Capers, RN Outcome: Progressing Goal: Ability to verbalize understanding of medication therapies will improve 09/20/2023 0448 by Parke Jandreau, Lucienne Capers, RN Outcome: Progressing 09/20/2023 0448 by Lakeitha Basques, Lucienne Capers, RN Outcome: Progressing Goal: Individualized Educational Video(s) 09/20/2023 0448 by Lanessa Shill, Lucienne Capers, RN Outcome: Progressing 09/20/2023 0448 by Shaheen Mende, Lucienne Capers, RN Outcome: Progressing   Problem: Activity: Goal: Capacity to carry out activities will improve 09/20/2023 0448 by Shekia Kuper, Lucienne Capers, RN Outcome: Progressing 09/20/2023 0448 by Fatma Rutten, Lucienne Capers, RN Outcome: Progressing   Problem: Cardiac: Goal: Ability to achieve and maintain adequate cardiopulmonary perfusion will improve 09/20/2023 0448 by Carina Chaplin, Lucienne Capers, RN Outcome: Progressing 09/20/2023 0448 by Caylynn Minchew, Lucienne Capers, RN Outcome: Progressing   Problem: Education: Goal: Ability to describe self-care measures that may prevent or decrease complications (Diabetes Survival Skills Education) will improve 09/20/2023 0448 by Sameul Tagle, Lucienne Capers, RN Outcome: Progressing 09/20/2023 0448 by Oryan Winterton, Lucienne Capers, RN Outcome: Progressing Goal: Individualized Educational Video(s) 09/20/2023 0448 by Jarren Para, Lucienne Capers, RN Outcome: Progressing 09/20/2023 0448 by Wonda Goodgame, Lucienne Capers, RN Outcome: Progressing   Problem: Coping: Goal: Ability to adjust to condition or change in health will improve 09/20/2023 0448 by Joseeduardo Brix, Lucienne Capers, RN Outcome: Progressing 09/20/2023 0448 by Adelise Buswell, Lucienne Capers, RN Outcome: Progressing   Problem: Fluid Volume: Goal: Ability to maintain a balanced intake and output will improve 09/20/2023 0448 by Leeba Barbe, Lucienne Capers, RN Outcome: Progressing 09/20/2023 0448 by Katha Kuehne, Lucienne Capers, RN Outcome: Progressing   Problem: Health Behavior/Discharge Planning: Goal: Ability to  identify and utilize available resources and services will improve 09/20/2023 0448 by Ashaunti Treptow, Lucienne Capers, RN Outcome: Progressing 09/20/2023 0448 by Tesa Meadors, Lucienne Capers, RN Outcome: Progressing Goal: Ability to manage health-related needs will improve 09/20/2023 0448 by Mariyam Remington, Lucienne Capers, RN Outcome: Progressing 09/20/2023 0448 by Amit Meloy, Lucienne Capers, RN Outcome: Progressing   Problem: Metabolic: Goal: Ability to maintain appropriate glucose levels will improve 09/20/2023 0448 by Noely Kuhnle, Lucienne Capers, RN Outcome: Progressing 09/20/2023 0448 by Jesson Foskey, Lucienne Capers, RN Outcome: Progressing   Problem: Nutritional: Goal: Maintenance of adequate nutrition will improve 09/20/2023 0448 by Ansen Sayegh, Lucienne Capers, RN Outcome: Progressing 09/20/2023 0448 by Creedence Kunesh, Lucienne Capers, RN Outcome: Progressing Goal: Progress toward achieving an optimal weight will improve 09/20/2023 0448 by Aerith Canal, Lucienne Capers, RN Outcome: Progressing 09/20/2023 0448 by Alliya Marcon, Lucienne Capers, RN Outcome: Progressing   Problem: Skin Integrity: Goal: Risk for impaired skin integrity will decrease 09/20/2023 0448 by Aquil Duhe, Lucienne Capers, RN Outcome: Progressing 09/20/2023 0448 by Shaya Reddick, Lucienne Capers, RN Outcome: Progressing   Problem: Tissue Perfusion: Goal: Adequacy of tissue perfusion will improve 09/20/2023 0448 by Esteen Delpriore, Lucienne Capers, RN Outcome: Progressing 09/20/2023 0448 by Leandre Wien, Lucienne Capers, RN Outcome: Progressing   Problem: Education: Goal: Knowledge of General Education information will improve Description: Including pain rating scale, medication(s)/side effects and non-pharmacologic comfort measures 09/20/2023 0448 by Kambryn Dapolito, Lucienne Capers, RN Outcome: Progressing 09/20/2023 0448 by Quintasha Gren, Lucienne Capers, RN Outcome: Progressing   Problem: Health Behavior/Discharge Planning: Goal: Ability to manage health-related needs will improve 09/20/2023 0448 by Muadh Creasy, Lucienne Capers, RN Outcome: Progressing 09/20/2023 0448 by Syrah Daughtrey, Lucienne Capers, RN Outcome: Progressing   Problem: Clinical  Measurements: Goal: Ability to maintain clinical measurements within normal limits will improve 09/20/2023 0448 by Jilliann Subramanian, Lucienne Capers, RN Outcome: Progressing 09/20/2023 0448 by Everest Hacking, Lucienne Capers, RN Outcome: Progressing Goal: Will remain free from infection 09/20/2023 0448  by Elwood Bazinet, Lucienne Capers, RN Outcome: Progressing 09/20/2023 0448 by Reyanne Hussar, Lucienne Capers, RN Outcome: Progressing Goal: Diagnostic test results will improve 09/20/2023 0448 by Shyhiem Beeney, Lucienne Capers, RN Outcome: Progressing 09/20/2023 0448 by Odell Choung, Lucienne Capers, RN Outcome: Progressing Goal: Respiratory complications will improve 09/20/2023 0448 by Kayse Puccini, Lucienne Capers, RN Outcome: Progressing 09/20/2023 0448 by Colby Catanese, Lucienne Capers, RN Outcome: Progressing Goal: Cardiovascular complication will be avoided 09/20/2023 0448 by Shannie Kontos, Lucienne Capers, RN Outcome: Progressing 09/20/2023 0448 by Candido Flott, Lucienne Capers, RN Outcome: Progressing   Problem: Activity: Goal: Risk for activity intolerance will decrease 09/20/2023 0448 by Marialuiza Car, Lucienne Capers, RN Outcome: Progressing 09/20/2023 0448 by Jaelon Gatley, Lucienne Capers, RN Outcome: Progressing   Problem: Nutrition: Goal: Adequate nutrition will be maintained 09/20/2023 0448 by Alani Lacivita, Lucienne Capers, RN Outcome: Progressing 09/20/2023 0448 by Chanin Frumkin, Lucienne Capers, RN Outcome: Progressing   Problem: Coping: Goal: Level of anxiety will decrease 09/20/2023 0448 by Yesena Reaves, Lucienne Capers, RN Outcome: Progressing 09/20/2023 0448 by Wynona Duhamel, Lucienne Capers, RN Outcome: Progressing   Problem: Elimination: Goal: Will not experience complications related to bowel motility 09/20/2023 0448 by Tammy Wickliffe, Lucienne Capers, RN Outcome: Progressing 09/20/2023 0448 by Anatalia Kronk, Lucienne Capers, RN Outcome: Progressing Goal: Will not experience complications related to urinary retention 09/20/2023 0448 by Idalee Foxworthy, Lucienne Capers, RN Outcome: Progressing 09/20/2023 0448 by Jahleel Stroschein, Lucienne Capers, RN Outcome: Progressing   Problem: Pain Managment: Goal: General experience of comfort will improve 09/20/2023 0448 by Orlin Kann,  Lucienne Capers, RN Outcome: Progressing 09/20/2023 0448 by Jahlil Ziller, Lucienne Capers, RN Outcome: Progressing   Problem: Safety: Goal: Ability to remain free from injury will improve 09/20/2023 0448 by Joandry Slagter, Lucienne Capers, RN Outcome: Progressing 09/20/2023 0448 by Caryl Fate, Lucienne Capers, RN Outcome: Progressing   Problem: Skin Integrity: Goal: Risk for impaired skin integrity will decrease 09/20/2023 0448 by Dreama Kuna, Lucienne Capers, RN Outcome: Progressing 09/20/2023 0448 by Maryuri Warnke, Lucienne Capers, RN Outcome: Progressing

## 2023-09-20 NOTE — Progress Notes (Signed)
Advanced Heart Failure Rounding Note  PCP-Cardiologist: Rollene Rotunda, MD   Subjective:    Remains on DBA. 2.5 Co-ox 67% SCr stable at 1.3  On diamox due to contraction alkalosis   Lasix gtt stopped yesterday.  CVP 7 -> 13 (checked personally)  Feeling better. Less SOB. Appetite mildly improved. Drinking a lot of water  Objective:   Weight Range: 81 kg Body mass index is 25.62 kg/m.   Vital Signs:   Temp:  [97 F (36.1 C)-97.9 F (36.6 C)] 97.9 F (36.6 C) (10/19 0700) Pulse Rate:  [84-110] 84 (10/19 0417) Resp:  [20-33] 20 (10/19 0417) BP: (82-128)/(61-98) 94/72 (10/19 0417) SpO2:  [93 %-99 %] 99 % (10/19 0417) FiO2 (%):  [40 %] 40 % (10/18 2024) Weight:  [81 kg] 81 kg (10/19 0417) Last BM Date : 09/19/23  Weight change: Filed Weights   09/18/23 0446 09/19/23 0431 09/20/23 0417  Weight: 86.8 kg 80.1 kg 81 kg    Intake/Output:   Intake/Output Summary (Last 24 hours) at 09/20/2023 1140 Last data filed at 09/20/2023 0816 Gross per 24 hour  Intake 1060.62 ml  Output 2600 ml  Net -1539.38 ml      Physical Exam    General:  chronically ill appearing HEENT: normal Neck: supple. JVP to ear Carotids 2+ bilat; no bruits. No lymphadenopathy or thryomegaly appreciated. Cor: Regular rate & rhythm. 2/6 TR Lungs: clear Abdomen: soft, nontender, + distended. No hepatosplenomegaly. No bruits or masses. Good bowel sounds. Extremities: no cyanosis, clubbing, rash, 1+ edema Under UNNA Neuro: alert & orientedx3, cranial nerves grossly intact. moves all 4 extremities w/o difficulty. Affect pleasant   Telemetry   NSR 90-100 Personally reviewed  Labs    CBC Recent Labs    09/19/23 0500 09/20/23 0430  WBC 11.9* 13.2*  HGB 16.9 16.2  HCT 53.7* 52.1*  MCV 86.8 87.9  PLT 192 203   Basic Metabolic Panel Recent Labs    16/10/96 0500 09/19/23 1955 09/20/23 0430  NA 136 136 137  K 3.1* 3.6 3.7  CL 82* 82* 78*  CO2 42* 37* 43*  GLUCOSE 146* 294* 158*   BUN 36* 37* 38*  CREATININE 1.30* 1.43* 1.31*  CALCIUM 8.4* 8.0* 9.4  MG 2.1  --  2.4   Liver Function Tests No results for input(s): "AST", "ALT", "ALKPHOS", "BILITOT", "PROT", "ALBUMIN" in the last 72 hours.  No results for input(s): "LIPASE", "AMYLASE" in the last 72 hours. Cardiac Enzymes No results for input(s): "CKTOTAL", "CKMB", "CKMBINDEX", "TROPONINI" in the last 72 hours.  BNP: BNP (last 3 results) Recent Labs    09/14/23 1942  BNP 668.0*    ProBNP (last 3 results) No results for input(s): "PROBNP" in the last 8760 hours.   D-Dimer No results for input(s): "DDIMER" in the last 72 hours. Hemoglobin A1C No results for input(s): "HGBA1C" in the last 72 hours. Fasting Lipid Panel No results for input(s): "CHOL", "HDL", "LDLCALC", "TRIG", "CHOLHDL", "LDLDIRECT" in the last 72 hours. Thyroid Function Tests No results for input(s): "TSH", "T4TOTAL", "T3FREE", "THYROIDAB" in the last 72 hours.  Invalid input(s): "FREET3"  Other results:   Imaging    No results found.   Medications:     Scheduled Medications:  acetaZOLAMIDE  250 mg Oral BID   apixaban  5 mg Oral BID   buPROPion  150 mg Oral Daily   Chlorhexidine Gluconate Cloth  6 each Topical Daily   dapagliflozin propanediol  10 mg Oral Daily   famotidine  20 mg Oral Daily   fluticasone furoate-vilanterol  1 puff Inhalation Daily   insulin aspart  0-9 Units Subcutaneous TID WC   insulin glargine-yfgn  8 Units Subcutaneous Daily   ipratropium-albuterol  3 mL Nebulization TID   potassium chloride  40 mEq Oral Daily   rosuvastatin  5 mg Oral QHS   sertraline  100 mg Oral Daily   sodium chloride flush  10-40 mL Intracatheter Q12H    Infusions:  DOBUTamine 2.5 mcg/kg/min (09/19/23 2200)    PRN Medications: acetaminophen **OR** acetaminophen, ALPRAZolam, ipratropium-albuterol, ondansetron **OR** ondansetron (ZOFRAN) IV, oxyCODONE, polyvinyl alcohol, sodium chloride flush    Patient Profile    Mr Zimmerer is a 72 year old with a history of  PAH, HTN, DVT, chronic respiratory failure on home oxygen, post COVID pulmonary fibrosis, GERD, DMII, DVT, and diastolic heart failure.    Admitted with A/C HFpEF .  Now with severely reduced RV. Transfer to [facility] for Advanced Heart Failure Team consultation/management, attempt at Palliative diuresis.   Assessment/Plan   1. 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  - Echo this admit  LVEF preserved and RV severely reduced - P/w NYHA Class IV symptoms and marked volume overload  - on DBA 2.5  Co-ox 67% - Weight down 17 pounds with lasix gtt but now re-accumulating quickly. Off lasix gtt. On diamox due to contraction alkalosis - Will start po torsemide 40 bid (was on lasix 40 daily PTA). Goal will be to try to get volume status stable prior to weaning DBA - Continue farxiga 10 mg   - Continue unna boots  - Asked him to watch fluid intake   2. Acute Hypoxic Respiratory Failure -Oxygen requirements increased on admit 13 liters-->weaned to 7 liters.  - At home he is on 5 liter [place].  - Continue diuresis    3.  Pulmonary HTN with cor pulmonale -Has history of ILD on chronic oxygen + OSA. Chronic respiratory failure on 5 liters oxygen.  - Suspect WHO Group III but needs additional work up.  - Continue Supp O2    4. AKI -Creatinine stable at 1.3 -Follow BMET    5. H/O DVT -US DVT RLE 2023  -On eliquis chronically, 5 mg bid  - no bleeding    6. GOC  - DNR/ DNI  - Palliative Care following.    Length of Stay: 6  Arvilla Meres, MD  09/20/2023, 11:40 AM  Advanced Heart Failure Team Pager [phone] (M-F; 7a - 5p)  Please contact [facility] for night-coverage after hours (5p -7a ) and weekends on [email or web address]

## 2023-09-20 NOTE — Progress Notes (Signed)
   09/19/23 2024  BiPAP/CPAP/SIPAP  $ Non-Invasive Ventilator  Non-Invasive Vent Subsequent  BiPAP/CPAP/SIPAP Pt Type Adult  BiPAP/CPAP/SIPAP V60  Mask Type Full face mask  Mask Size Medium  Set Rate 10 breaths/min  Respiratory Rate 28 breaths/min  IPAP 14 cmH20  EPAP 7 cmH2O  Pressure Support 7 cmH20  FiO2 (%) 40 %  Minute Ventilation 11.2  Leak 4  Peak Inspiratory Pressure (PIP) 14  Tidal Volume (Vt) 421  Patient Home Equipment No  Auto Titrate No  Press High Alarm 25 cmH2O  Press Low Alarm 5 cmH2O

## 2023-09-20 NOTE — Progress Notes (Signed)
Progress Note   Patient: Tim Day WUJ:[id number] DOB: Feb 04, 1951 DOA: 09/14/2023     6 DOS: the patient was seen and examined on 09/20/2023   Brief hospital course: Tim Day was admitted to the hospital with the working diagnosis of heart failure exacerbation.   72 year old M with PMH of diastolic CHF, COPD, chronic hypoxic RF on 4 to 5 L and nightly BiPAP, DM-2, HTN, HLD, DVT on Eliquis, anxiety and depression presenting with shortness of breath and orthopnea, edema and overall decline, and admitted for acute on chronic diastolic CHF.  Overall decline since he had COVID-19 infection many years ago.  His BNP was elevated to 668.  Portable CXR with cardiomegaly, low lung volumes, mild to moderate bibasilar atelectasis and possible infiltrate.  Patient was started on IV Lasix and admitted.   TTE with LVEF of 50%, indeterminate DD, RVSP of 65.7 mmHg, mild to moderate TR. diuresis limited by soft blood pressures.  Cardiology consulted and transferred patient to ICU for inotropes.   10/17 patient on dobutamine infusion and furosemide drip.  10/18 patient responding well to medical therapy.  10/20 improving volume status, and 02 requirements.   Assessment and Plan: * Acute on chronic diastolic CHF (congestive heart failure) (HCC) Echocardiogram with preserved LV systolic function with EF 50%, interventricular septum is flattened in systole and diastole, RV systolic function with severe reduction, mild to moderate TR, RVSP 65.7 mmHg,   Acute on chronic core pulmonale Pulmonary hypertension Right heart failure.   Urine output is 3,900  ml Systolic blood pressure 98 to 956 mmHg. SV02 66.8  10/17 Metolazone 5 mg  Continue diuresis with sequential nephron blockade with dapagliflozin, Acetazolamide and torsemide.  Inotropic support with dobutamine 2,5 mcg/ kg/min.   Holding on B blockade and RAAS inhibition due to risk of hypotension.   Acute hypoxemic respiratory failure  due to acute cardiogenic pulmonary edema and pulmonary hypertension. Off Bipap  Now on HFNC 5 L/min with 02 saturation 98%.    AKI (acute kidney injury) (HCC) Hyponatremia, hypokalemia.   Renal function with serum cr at 1,31 with K at 3,7 and serum bicarbonate at 43.  Na 137 Mg. 2.4   Diuresis with torsemide. Add 40 Kcl to prevent hypokalemia.   Anxiety and depression - Continue Xanax and Wellbutrin  Type 2 diabetes mellitus with hyperlipidemia (HCC) Glucose has been stable  Continue with insulin sliding scale and basal (8 units)  Continue with statin therapy.    History of DVT (deep vein thrombosis) Anticoagulation with apixaban.         Subjective: Patient with improvement on dyspnea, and edema, not yet back to baseline, he was on the chair today   Physical Exam: Vitals:   09/20/23 0000 09/20/23 0417 09/20/23 0700 09/20/23 1210  BP: (!) 128/98 94/72  (!) 141/107  Pulse: 88 84    Resp: 20 20  (!) 29  Temp: (!) 97 F (36.1 C) (!) 97 F (36.1 C) 97.9 F (36.6 C) (!) 97.5 F (36.4 C)  TempSrc: Oral Oral Oral Oral  SpO2: 94% 99%    Weight:  81 kg    Height:       Neurology awake and alert ENT With mild pallor Cardiovascular with S1 and S2 present and regular with no gallops, rubs or murmurs Mild JVD Trace lower extremity edema, (unna boots in place) Respiratory with mild rales with no wheezing or rhonchi Abdomen with no distention  Data Reviewed:    Family Communication: I spoke with patient's  wife at the bedside, we talked in detail about patient's condition, plan of care and prognosis and all questions were addressed.   Disposition: Status is: Inpatient Remains inpatient appropriate because: diuresis and inotropic support  Planned Discharge Destination: Home     Author: Coralie Keens, MD 09/20/2023 2:05 PM  For on call review [email or web address].

## 2023-09-20 NOTE — Progress Notes (Signed)
Daily Progress Note   Patient Name: Tim Day       Date: 09/20/2023 DOB: [birth year]  Age: 72 y.o. MRN#: [id number] Attending Physician: Coralie Keens Primary Care Physician: Benita Stabile, MD Admit Date: 09/14/2023  Reason for Consultation/Follow-up: Establishing goals of care  Length of Stay: 6  Current Medications: Scheduled Meds:   acetaZOLAMIDE  250 mg Oral BID   apixaban  5 mg Oral BID   buPROPion  150 mg Oral Daily   Chlorhexidine Gluconate Cloth  6 each Topical Daily   dapagliflozin propanediol  10 mg Oral Daily   famotidine  20 mg Oral Daily   fluticasone furoate-vilanterol  1 puff Inhalation Daily   insulin aspart  0-9 Units Subcutaneous TID WC   insulin glargine-yfgn  8 Units Subcutaneous Daily   ipratropium-albuterol  3 mL Nebulization TID   potassium chloride  40 mEq Oral Daily   rosuvastatin  5 mg Oral QHS   sertraline  100 mg Oral Daily   sodium chloride flush  10-40 mL Intracatheter Q12H    Continuous Infusions:  DOBUTamine 2.5 mcg/kg/min (09/19/23 2200)    PRN Meds: acetaminophen **OR** acetaminophen, ALPRAZolam, ipratropium-albuterol, ondansetron **OR** ondansetron (ZOFRAN) IV, oxyCODONE, polyvinyl alcohol, sodium chloride flush  Physical Exam Vitals reviewed.  Constitutional:      General: He is sleeping.     Appearance: He is ill-appearing.     Interventions: Nasal cannula in place.  HENT:     Head: Normocephalic and atraumatic.  Cardiovascular:     Rate and Rhythm: Normal rate.  Pulmonary:     Effort: Tachypnea present.             Vital Signs: BP (!) 141/107 (BP Location: Left Arm)   Pulse 84   Temp (!) 97.5 F (36.4 C) (Oral)   Resp (!) 29   Ht 5\' 10"  (1.778 m)   Wt 81 kg   SpO2 99%   BMI 25.62 kg/m  SpO2:  SpO2: 99 % O2 Device: O2 Device: Nasal Cannula O2 Flow Rate: O2 Flow Rate (L/min): 5 L/min  Intake/output summary:  Intake/Output Summary (Last 24 hours) at 09/20/2023 1213 Last data filed at 09/20/2023 1212 Gross per 24 hour  Intake 1060.62 ml  Output 3050 ml  Net -1989.38 ml   LBM: Last BM Date : 09/19/23  Baseline Weight: Weight: 84.4 kg Most recent weight: Weight: 81 kg      Patient Active Problem List   Diagnosis Date Noted   History of DVT (deep vein thrombosis) 09/15/2023   Anxiety and depression 09/15/2023   AKI (acute kidney injury) (HCC) 09/15/2023   CHF exacerbation (HCC) 09/14/2023   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   Pulmonary HTN (HCC) 05/26/2022   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) 05/26/2022   Pulmonary fibrosis (HCC)    Acute on chronic diastolic CHF (congestive heart failure) (HCC)    Respiratory failure with hypoxia and hypercapnia (HCC) 05/06/2022   DOE (dyspnea on exertion) 04/08/2022   Acute respiratory failure with hypercapnia (HCC) 11/17/2021   Acute metabolic encephalopathy    Hyperglycemia due to diabetes mellitus (HCC)    Viral gastroenteritis    Solitary pulmonary nodule on lung CT 08/02/2021   Elevated troponin I level 07/09/2021   Severe sepsis (HCC) 04/09/2021   Acute cholecystitis 04/09/2021   Shock (HCC)    Acute on chronic respiratory failure with hypoxia (HCC)    Chronic respiratory failure with hypoxia (HCC) 02/09/2021   Sinus arrhythmia 12/26/2020   Adjustment disorder with anxious mood 09/15/2020   Protein-calorie malnutrition, severe 08/25/2020   Primary spontaneous pneumothorax    Pneumonia of both lower lobes due to infectious organism    Pressure injury of skin 08/20/2020   Pneumothorax, left 08/19/2020   Pneumonia due to COVID-19 virus 08/19/2020   Leg DVT (deep venous thromboembolism), acute, bilateral (HCC) 08/19/2020   Sepsis (HCC) 08/08/2020   Hyponatremia 08/08/2020    Hypocalcemia 08/03/2020   Transaminitis 08/03/2020   COVID-19 virus infection 07/22/20 > 02 dep since 08/02/2020   Thrombocytopenia (HCC) 08/02/2020   Leukopenia 08/02/2020   Esophageal reflux    Chest tube in place 01/22/2015   Hypercholesteremia    Hypertension    Cardiogenic shock (HCC) 01/20/2015   Type 2 diabetes mellitus with hyperlipidemia (HCC) 01/20/2015   Benign essential HTN 01/20/2015   Dyslipidemia 01/20/2015    Palliative Care Assessment & Plan   Patient Profile: 72 y.o. male  with past medical history of chronic respiratory failure 4 to 5 L, history of DVT, HTN/HLD, DM2, general decline after 72-day hospital stay for COVID several years ago, GERD, malignant melanoma of the skin admitted on 09/14/2023 with CHF exacerbation.   Today's discussion: Patient was sleeping in recliner. Met with his wife at bedside. She reports the patient did not sleep well last night but has been resting today in the recliner. We discussed the progression of his health concerns since Covid.   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 His wife shares that it was difficult for the patient to stop working. She was able to retire to stay with and help him. She shares this has been difficult for them both but they are fortunate to have a large support system including their daughter--- who arrived during our conversation.  We discussed the importance of continued conversation with family and the medical providers regarding overall plan of care and treatment options, ensuring decisions are within the context of the patient's values and GOCs. We discussed hoping for the best but being prepared to shift goals depending on the patient's condition and if his heart failure can be stabilized. Emotional support provided.  Questions and concerns were addressed. The family was encouraged to call with questions or concerns.  PMT will  continue to support holistically.  Recommendations/Plan: Full code Full scope Encouraged continued conversations around GOC Considering outpatient palliative follow up   Code Status:    Code Status Orders  (From admission, onward)           Start     Ordered   09/17/23 1553  Do not attempt resuscitation (DNR)- Limited -Do Not Intubate (DNI)  (Code Status)  Continuous       Question Answer Comment  If pulseless and not breathing No CPR or chest compressions.   In Pre-Arrest Conditions (Patient Is Breathing and Has A Pulse) Do not intubate. Provide all appropriate non-invasive medical interventions. Avoid ICU transfer unless indicated or required.   Consent: Discussion documented in EHR or advanced directives reviewed      09/17/23 1552         Extensive chart review has been completed prior to seeing the patient and speaking with his wife including labs, vital signs, imaging, progress/consult notes, orders, medications, and available advance directive documents.   Time spent: 35 minutes  Thank you for allowing the Palliative Medicine Team to assist in the care of this patient.  Sherryll Burger, NP  Please contact Palliative Medicine Team phone at [phone] for questions and concerns.

## 2023-09-20 NOTE — Progress Notes (Signed)
   09/20/23 2054  BiPAP/CPAP/SIPAP  $ Non-Invasive Ventilator  Non-Invasive Vent Subsequent  BiPAP/CPAP/SIPAP Pt Type Adult  BiPAP/CPAP/SIPAP V60  Mask Type Full face mask  Mask Size Medium  Set Rate 10 breaths/min  Respiratory Rate 28 breaths/min  IPAP 14 cmH20  EPAP 7 cmH2O  FiO2 (%) 40 %  Minute Ventilation 19.4  Leak 23  Peak Inspiratory Pressure (PIP) 15  Tidal Volume (Vt) 516  Patient Home Equipment No  Nasal massage performed Yes  CPAP/SIPAP surface wiped down Yes  BiPAP/CPAP /SiPAP Vitals  Bilateral Breath Sounds Diminished

## 2023-09-20 NOTE — Plan of Care (Signed)
Tim Day

## 2023-09-21 ENCOUNTER — Inpatient Hospital Stay (HOSPITAL_COMMUNITY): Payer: PPO

## 2023-09-21 DIAGNOSIS — Z515 Encounter for palliative care: Secondary | ICD-10-CM | POA: Diagnosis not present

## 2023-09-21 DIAGNOSIS — N179 Acute kidney failure, unspecified: Secondary | ICD-10-CM | POA: Diagnosis not present

## 2023-09-21 DIAGNOSIS — F419 Anxiety disorder, unspecified: Secondary | ICD-10-CM | POA: Diagnosis not present

## 2023-09-21 DIAGNOSIS — E1169 Type 2 diabetes mellitus with other specified complication: Secondary | ICD-10-CM | POA: Diagnosis not present

## 2023-09-21 DIAGNOSIS — I5033 Acute on chronic diastolic (congestive) heart failure: Secondary | ICD-10-CM | POA: Diagnosis not present

## 2023-09-21 LAB — GLUCOSE, CAPILLARY
Glucose-Capillary: 177 mg/dL — ABNORMAL HIGH (ref 70–99)
Glucose-Capillary: 172 mg/dL — ABNORMAL HIGH (ref 70–99)
Glucose-Capillary: 183 mg/dL — ABNORMAL HIGH (ref 70–99)

## 2023-09-21 LAB — CBC
HCT: 53.6 % — ABNORMAL HIGH (ref 39.0–52.0)
Hemoglobin: 16.1 g/dL (ref 13.0–17.0)
MCH: 26.9 pg (ref 26.0–34.0)
MCHC: 30 g/dL (ref 30.0–36.0)
MCV: 89.5 fL (ref 80.0–100.0)
Platelets: 181 10*3/uL (ref 150–400)
RBC: 5.99 MIL/uL — ABNORMAL HIGH (ref 4.22–5.81)
RDW: 16.3 % — ABNORMAL HIGH (ref 11.5–15.5)
WBC: 11.2 10*3/uL — ABNORMAL HIGH (ref 4.0–10.5)
nRBC: 0 % (ref 0.0–0.2)

## 2023-09-21 LAB — MAGNESIUM: Magnesium: 2.4 mg/dL (ref 1.7–2.4)

## 2023-09-21 LAB — COOXEMETRY PANEL
Carboxyhemoglobin: 1.8 % — ABNORMAL HIGH (ref 0.5–1.5)
Methemoglobin: <0.7 % (ref 0.0–1.5)
O2 Saturation: 69.1 %
Total hemoglobin: 16.8 g/dL — ABNORMAL HIGH (ref 12.0–16.0)

## 2023-09-21 LAB — BASIC METABOLIC PANEL
Anion gap: 15 (ref 5–15)
Anion gap: 11 (ref 5–15)
BUN: 43 mg/dL — ABNORMAL HIGH (ref 8–23)
BUN: 49 mg/dL — ABNORMAL HIGH (ref 8–23)
CO2: 44 mmol/L — ABNORMAL HIGH (ref 22–32)
CO2: 41 mmol/L — ABNORMAL HIGH (ref 22–32)
Calcium: 8.7 mg/dL — ABNORMAL LOW (ref 8.9–10.3)
Calcium: 8.8 mg/dL — ABNORMAL LOW (ref 8.9–10.3)
Chloride: 81 mmol/L — ABNORMAL LOW (ref 98–111)
Chloride: 83 mmol/L — ABNORMAL LOW (ref 98–111)
Creatinine, Ser: 1.29 mg/dL — ABNORMAL HIGH (ref 0.61–1.24)
Creatinine, Ser: 1.38 mg/dL — ABNORMAL HIGH (ref 0.61–1.24)
GFR, Estimated: 60 mL/min — ABNORMAL LOW (ref >60)
GFR, Estimated: 55 mL/min — ABNORMAL LOW (ref >60)
Glucose, Bld: 148 mg/dL — ABNORMAL HIGH (ref 70–99)
Glucose, Bld: 234 mg/dL — ABNORMAL HIGH (ref 70–99)
Potassium: 3 mmol/L — ABNORMAL LOW (ref 3.5–5.1)
Potassium: 3.9 mmol/L (ref 3.5–5.1)
Sodium: 140 mmol/L (ref 135–145)
Sodium: 135 mmol/L (ref 135–145)

## 2023-09-21 MED ORDER — SPIRONOLACTONE 12.5 MG HALF TABLET
12.5000 mg | ORAL_TABLET | Freq: Every day | ORAL | Status: DC
  Administered 2023-09-21 – 2023-09-25 (×5): 12.5 mg via ORAL
  Filled 2023-09-21 (×5): qty 1

## 2023-09-21 MED ORDER — POTASSIUM CHLORIDE CRYS ER 20 MEQ PO TBCR
40.0000 meq | EXTENDED_RELEASE_TABLET | Freq: Once | ORAL | Status: AC
  Administered 2023-09-21: 40 meq via ORAL
  Filled 2023-09-21: qty 2

## 2023-09-21 MED ORDER — DOBUTAMINE-DEXTROSE 4-5 MG/ML-% IV SOLN
1.5000 ug/kg/min | INTRAVENOUS | Status: DC
  Administered 2023-09-22: 1.5 ug/kg/min via INTRAVENOUS

## 2023-09-21 NOTE — Progress Notes (Signed)
Patient ID: Tim Day, male   DOB: 09-Sep-1951, 72 y.o.   MRN: [id number]     Advanced Heart Failure Rounding Note  PCP-Cardiologist: Rollene Rotunda, MD   Subjective:    Remains on dobutamine 2.5, co-ox 69%.  Creatinine stable 1.29.  He is currently on torsemide 40 mg bid + acetazolamide 250 bid.   CVP 6 this morning.   He is now on 5L [place], this is what he is on at home. Has been up to chair.   Objective:   Weight Range: 77.5 kg Body mass index is 24.52 kg/m.   Vital Signs:   Temp:  [97.4 F (36.3 C)-98.3 F (36.8 C)] 98.3 F (36.8 C) (10/20 0800) Pulse Rate:  [81-88] 85 (10/20 0821) Resp:  [18-29] 18 (10/20 0821) BP: (97-141)/(73-107) 97/75 (10/20 0800) SpO2:  [93 %-100 %] 93 % (10/20 0821) FiO2 (%):  [40 %] 40 % (10/19 2255) Weight:  [77.5 kg-79.7 kg] 77.5 kg (10/20 0410) Last BM Date : 09/20/23  Weight change: Filed Weights   09/20/23 0417 09/21/23 0035 09/21/23 0410  Weight: 81 kg 79.7 kg 77.5 kg    Intake/Output:   Intake/Output Summary (Last 24 hours) at 09/21/2023 0846 Last data filed at 09/21/2023 0414 Gross per 24 hour  Intake 563.52 ml  Output 2850 ml  Net -2286.48 ml      Physical Exam    General: NAD Neck: No JVD, no thyromegaly or thyroid nodule.  Lungs: Clear to auscultation bilaterally with normal respiratory effort. CV: Nondisplaced PMI.  Heart regular S1/S2, no S3/S4, 2/6 HSM LLSB.  1+ edema to knees.  Abdomen: Soft, nontender, no hepatosplenomegaly, no distention.  Skin: Intact without lesions or rashes.  Neurologic: Alert and oriented x 3.  Psych: Normal affect. Extremities: No clubbing or cyanosis.  HEENT: Normal.    Telemetry   NSR 90s. Personally reviewed  Labs    CBC Recent Labs    09/20/23 0430 09/21/23 0455  WBC 13.2* 11.2*  HGB 16.2 16.1  HCT 52.1* 53.6*  MCV 87.9 89.5  PLT 203 181   Basic Metabolic Panel Recent Labs    57/84/69 0430 09/21/23 0455  NA 137 140  K 3.7 3.0*  CL 78* 81*  CO2 43* 44*   GLUCOSE 158* 148*  BUN 38* 43*  CREATININE 1.31* 1.29*  CALCIUM 9.4 8.7*  MG 2.4 2.4   Liver Function Tests No results for input(s): "AST", "ALT", "ALKPHOS", "BILITOT", "PROT", "ALBUMIN" in the last 72 hours.  No results for input(s): "LIPASE", "AMYLASE" in the last 72 hours. Cardiac Enzymes No results for input(s): "CKTOTAL", "CKMB", "CKMBINDEX", "TROPONINI" in the last 72 hours.  BNP: BNP (last 3 results) Recent Labs    09/14/23 1942  BNP 668.0*    ProBNP (last 3 results) No results for input(s): "PROBNP" in the last 8760 hours.   D-Dimer No results for input(s): "DDIMER" in the last 72 hours. Hemoglobin A1C No results for input(s): "HGBA1C" in the last 72 hours. Fasting Lipid Panel No results for input(s): "CHOL", "HDL", "LDLCALC", "TRIG", "CHOLHDL", "LDLDIRECT" in the last 72 hours. Thyroid Function Tests No results for input(s): "TSH", "T4TOTAL", "T3FREE", "THYROIDAB" in the last 72 hours.  Invalid input(s): "FREET3"  Other results:   Imaging    No results found.   Medications:     Scheduled Medications:  acetaZOLAMIDE  250 mg Oral BID   apixaban  5 mg Oral BID   buPROPion  150 mg Oral Daily   Chlorhexidine Gluconate Cloth  6  each Topical Daily   dapagliflozin propanediol  10 mg Oral Daily   famotidine  20 mg Oral Daily   fluticasone furoate-vilanterol  1 puff Inhalation Daily   insulin aspart  0-9 Units Subcutaneous TID WC   insulin glargine-yfgn  8 Units Subcutaneous Daily   ipratropium-albuterol  3 mL Nebulization TID   potassium chloride  40 mEq Oral Daily   potassium chloride  40 mEq Oral Once   rosuvastatin  5 mg Oral QHS   sertraline  100 mg Oral Daily   sodium chloride flush  10-40 mL Intracatheter Q12H   spironolactone  12.5 mg Oral Daily   torsemide  40 mg Oral BID    Infusions:  DOBUTamine      PRN Medications: acetaminophen **OR** acetaminophen, ALPRAZolam, ipratropium-albuterol, ondansetron **OR** ondansetron (ZOFRAN) IV,  oxyCODONE, polyvinyl alcohol, sodium chloride flush    Patient Profile   Mr Zimmerle is a 72 year old with a history of  PAH, HTN, DVT, chronic respiratory failure on home oxygen, post COVID pulmonary fibrosis, GERD, DMII, DVT, and diastolic heart failure.    Admitted with A/C HFpEF .  Now with severely reduced RV. Transfer to [facility] for Advanced Heart Failure Team consultation/management, attempt at Palliative diuresis.   Assessment/Plan   1. 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  - Echo this admit  LVEF preserved and RV severely reduced - P/w NYHA Class IV symptoms and marked volume overload  - On dobutamine 2.5 for RV support, co-ox 69%.  Now on po diuretics.  Will start weaning dobutamine, decrease to 1.5 today.  - Weight trending down on torsemide 40 bid + acetazolamide 250 bid.  HCO3 44 today.  CVP 6.  Continue this regimen today, as HCO3 stabilizes will need to stop acetazolamide.   - Start spironolactone 12.5 daily and replace K.  BMET in pm.  - Continue farxiga 10 mg daily.  - Continue unna boots  - Asked him to watch fluid intake   2. Acute Hypoxic Respiratory Failure -Oxygen requirements increased on admit 13 liters-->weaned to 5 liters.  - At home he is on 5 liter [place].  - Now on po diuretics.    3.  Pulmonary HTN with cor pulmonale - Has history of ILD on chronic oxygen + OSA. Chronic respiratory failure on 5 liters oxygen.  - Suspect WHO Group III but needs additional work up => would get high resolution CT chest now that volume status seems optimized.  - Continue Supp O2    4. AKI -Creatinine stable at 1.29 -Follow BMET    5. H/O DVT - US DVT RLE 2023  - On eliquis chronically, 5 mg bid  - no bleeding    6. GOC  - DNR/ DNI  - Palliative Care following.    Length of Stay: 7  Marca Ancona, MD  09/21/2023, 8:46 AM  Advanced Heart Failure Team Pager [phone] (M-F; 7a - 5p)  Please contact [facility] for  night-coverage after hours (5p -7a ) and weekends on [email or web address]

## 2023-09-21 NOTE — Progress Notes (Signed)
Daily Progress Note   Patient Name: Tim Day       Date: 09/21/2023 DOB: [birth year]  Age: 72 y.o. MRN#: [id number] Attending Physician: Tim Day Primary Care Physician: Tim Stabile, MD Admit Date: 09/14/2023  Reason for Consultation/Follow-up: Establishing goals of care  Length of Stay: 7  Current Medications: Scheduled Meds:   acetaZOLAMIDE  250 mg Oral BID   apixaban  5 mg Oral BID   buPROPion  150 mg Oral Daily   Chlorhexidine Gluconate Cloth  6 each Topical Daily   dapagliflozin propanediol  10 mg Oral Daily   famotidine  20 mg Oral Daily   fluticasone furoate-vilanterol  1 puff Inhalation Daily   insulin aspart  0-9 Units Subcutaneous TID WC   insulin glargine-yfgn  8 Units Subcutaneous Daily   ipratropium-albuterol  3 mL Nebulization TID   potassium chloride  40 mEq Oral Daily   rosuvastatin  5 mg Oral QHS   sertraline  100 mg Oral Daily   sodium chloride flush  10-40 mL Intracatheter Q12H   spironolactone  12.5 mg Oral Daily   torsemide  40 mg Oral BID    Continuous Infusions:  DOBUTamine 1.5 mcg/kg/min (09/21/23 0848)    PRN Meds: acetaminophen **OR** acetaminophen, ALPRAZolam, ipratropium-albuterol, ondansetron **OR** ondansetron (ZOFRAN) IV, oxyCODONE, polyvinyl alcohol, sodium chloride flush  Physical Exam Vitals reviewed.  Constitutional:      General: He is awake.     Appearance: He is ill-appearing.     Interventions: Nasal cannula in place.  HENT:     Head: Normocephalic and atraumatic.  Cardiovascular:     Rate and Rhythm: Normal rate.  Pulmonary:     Effort: Pulmonary effort is normal.  Neurological:     Mental Status: He is oriented to person, place, and time.  Psychiatric:        Mood and Affect: Mood normal.         Behavior: Behavior normal.             Vital Signs: BP 94/75 (BP Location: Left Arm)   Pulse 80   Temp 98.2 F (36.8 C) (Oral)   Resp 18   Ht 5\' 10"  (1.778 m)   Wt 77.5 kg   SpO2 97%   BMI 24.52 kg/m  SpO2: SpO2: 97 % O2 Device:  O2 Device: Nasal Cannula O2 Flow Rate: O2 Flow Rate (L/min): 5 L/min  Intake/output summary:  Intake/Output Summary (Last 24 hours) at 09/21/2023 1554 Last data filed at 09/21/2023 1300 Gross per 24 hour  Intake 663.52 ml  Output 3200 ml  Net -2536.48 ml   LBM: Last BM Date : 09/20/23 Baseline Weight: Weight: 84.4 kg Most recent weight: Weight: 77.5 kg      Patient Active Problem List   Diagnosis Date Noted   History of DVT (deep vein thrombosis) 09/15/2023   Anxiety and depression 09/15/2023   AKI (acute kidney injury) (HCC) 09/15/2023   CHF exacerbation (HCC) 09/14/2023   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   Pulmonary HTN (HCC) 05/26/2022   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) 05/26/2022   Pulmonary fibrosis (HCC)    Acute on chronic diastolic CHF (congestive heart failure) (HCC)    Respiratory failure with hypoxia and hypercapnia (HCC) 05/06/2022   DOE (dyspnea on exertion) 04/08/2022   Acute respiratory failure with hypercapnia (HCC) 11/17/2021   Acute metabolic encephalopathy    Hyperglycemia due to diabetes mellitus (HCC)    Viral gastroenteritis    Solitary pulmonary nodule on lung CT 08/02/2021   Elevated troponin I level 07/09/2021   Severe sepsis (HCC) 04/09/2021   Acute cholecystitis 04/09/2021   Shock (HCC)    Acute on chronic respiratory failure with hypoxia (HCC)    Chronic respiratory failure with hypoxia (HCC) 02/09/2021   Sinus arrhythmia 12/26/2020   Adjustment disorder with anxious mood 09/15/2020   Protein-calorie malnutrition, severe 08/25/2020   Primary spontaneous pneumothorax    Pneumonia of both lower lobes due to infectious organism    Pressure injury of skin  08/20/2020   Pneumothorax, left 08/19/2020   Pneumonia due to COVID-19 virus 08/19/2020   Leg DVT (deep venous thromboembolism), acute, bilateral (HCC) 08/19/2020   Sepsis (HCC) 08/08/2020   Hyponatremia 08/08/2020   Hypocalcemia 08/03/2020   Transaminitis 08/03/2020   COVID-19 virus infection 07/22/20 > 02 dep since 08/02/2020   Thrombocytopenia (HCC) 08/02/2020   Leukopenia 08/02/2020   Esophageal reflux    Chest tube in place 01/22/2015   Hypercholesteremia    Hypertension    Cardiogenic shock (HCC) 01/20/2015   Type 2 diabetes mellitus with hyperlipidemia (HCC) 01/20/2015   Benign essential HTN 01/20/2015   Dyslipidemia 01/20/2015    Palliative Care Assessment & Plan   Patient Profile: 72 y.o. male  with past medical history of chronic respiratory failure 4 to 5 L, history of DVT, HTN/HLD, DM2, general decline after 72-day hospital stay for COVID several years ago, GERD, malignant melanoma of the skin admitted on 09/14/2023 with CHF exacerbation.   Today's discussion: Patient was sitting upright in recliner eating some food his brother in law made at home. His wife and family at bedside. The patient is down to 5L by nasal cannula which is his normal. He reports his breathing feels better. His wife reports the patient had a few good bowel movements and has had more strength today when mobilizing. The patient had a high resolution chest CT today and they are grateful this was able to be done inpatient.   We discussed the amount of support they have had from family during this hospitalization and the patient's health decline. We discussed the importance of continued conversation with family and the medical providers regarding overall plan of care and treatment options, ensuring decisions are within the context of the patient's values and GOCs. Emotional support provided.  Questions and concerns were addressed. The family was encouraged to call with questions or concerns. PMT will  continue to support holistically.  Recommendations/Plan: Full code Full scope Encouraged continued conversations around GOC Considering outpatient palliative follow up Continued PMT support   Code Status:    Code Status Orders  (From admission, onward)           Start     Ordered   09/17/23 1553  Do not attempt resuscitation (DNR)- Limited -Do Not Intubate (DNI)  (Code Status)  Continuous       Question Answer Comment  If pulseless and not breathing No CPR or chest compressions.   In Pre-Arrest Conditions (Patient Is Breathing and Has A Pulse) Do not intubate. Provide all appropriate non-invasive medical interventions. Avoid ICU transfer unless indicated or required.   Consent: Discussion documented in EHR or advanced directives reviewed      09/17/23 1552         Extensive chart review has been completed prior to seeing the patient and speaking with his wife and family including labs, vital signs, imaging, progress/consult notes, orders, medications, and available advance directive documents.   Time spent: 25 minutes  Thank you for allowing the Palliative Medicine Team to assist in the care of this patient.  Sherryll Burger, NP  Please contact Palliative Medicine Team phone at [phone] for questions and concerns.

## 2023-09-21 NOTE — Progress Notes (Signed)
Progress Note   Patient: Tim Day QMV:[id number] DOB: 01/10/1951 DOA: 09/14/2023     7 DOS: the patient was seen and examined on 09/21/2023   Brief hospital course: Tim Day was admitted to the hospital with the working diagnosis of heart failure exacerbation.   72 year old M with PMH of diastolic CHF, COPD, chronic hypoxic RF on 4 to 5 L and nightly BiPAP, DM-2, HTN, HLD, DVT on Eliquis, anxiety and depression presenting with shortness of breath and orthopnea, edema and overall decline, and admitted for acute on chronic diastolic CHF.  Overall decline since he had COVID-19 infection many years ago.  His BNP was elevated to 668.  Portable CXR with cardiomegaly, low lung volumes, mild to moderate bibasilar atelectasis and possible infiltrate.  Patient was started on IV Lasix and admitted.   TTE with LVEF of 50%, indeterminate DD, RVSP of 65.7 mmHg, mild to moderate TR. diuresis limited by soft blood pressures.  Cardiology consulted and transferred patient to ICU for inotropes.   10/17 patient on dobutamine infusion and furosemide drip.  10/18 patient responding well to medical therapy.  10/20 improving volume status, and 02 requirements. Start wean off dobutamine.   Assessment and Plan: * Acute on chronic diastolic CHF (congestive heart failure) (HCC) Echocardiogram with preserved LV systolic function with EF 50%, interventricular septum is flattened in systole and diastole, RV systolic function with severe reduction, mild to moderate TR, RVSP 65.7 mmHg,   Acute on chronic core pulmonale Pulmonary hypertension Right heart failure.   Urine output is 2,850  ml Systolic blood pressure 97, 94, 105 mmHg. SV02 69.1  10/17 Metolazone 5 mg  Continue diuresis with sequential nephron blockade with dapagliflozin, Acetazolamide, spironolactone and torsemide.  Decrease inotropic support with dobutamine 1.5 mcg/ kg/min.   Holding on B blockade due to risk of hypotension.    Acute hypoxemic respiratory failure due to acute cardiogenic pulmonary edema and pulmonary hypertension. Intermittent Bipap  HFNC 5 L/min with 02 saturation 99%.    AKI (acute kidney injury) (HCC) Hyponatremia, hypokalemia.   Renal function today with serum cr at 1,29 with K at 3,0 and serum bicarbonate at 44. Na 140 and Mg 2.4   Continue K correction with Kcl, follow K this pm.  Anxiety and depression On alprazolam, sertraline and bupropion   Type 2 diabetes mellitus with hyperlipidemia (HCC) Glucose has been stable  Continue with insulin sliding scale and basal (8 units)  Continue with statin therapy.    History of DVT (deep vein thrombosis) Anticoagulation with apixaban.         Subjective: Patient with improvement in dyspnea, using bipap intermittently no chest pain, edema is improving   Physical Exam: Vitals:   09/21/23 0800 09/21/23 0821 09/21/23 0903 09/21/23 1223  BP: 97/75   94/76  Pulse: 87 85  80  Resp: 18 18  18   Temp: 98.3 F (36.8 C)   98.2 F (36.8 C)  TempSrc: Oral   Oral  SpO2: 100% 93% 95% 99%  Weight:      Height:       Neurology awake and alert ENT with mild pallor, dressing over bridge of the nose Cardiovascular with S1 and S2 present and regular with no gallops, rubs or murmurs Moderate JVD Trace lower extremity edema, pitting, unna boots in place Respiratory with scattered rales, no rhonchi or wheezing,  Abdomen with no distention  Data Reviewed:    Family Communication: I spoke with patient's family at the bedside, we talked in detail  about patient's condition, plan of care and prognosis and all questions were addressed.   Disposition: Status is: Inpatient Remains inpatient appropriate because: recovering low output heart failure  Planned Discharge Destination: Home      Author: Coralie Keens, MD 09/21/2023 1:19 PM  For on call review [email or web address].

## 2023-09-21 NOTE — Progress Notes (Signed)
   09/21/23 2228  BiPAP/CPAP/SIPAP  BiPAP/CPAP/SIPAP Pt Type Adult  BiPAP/CPAP/SIPAP V60  Mask Type Full face mask  Mask Size Medium  Set Rate 12 breaths/min  Respiratory Rate 32 breaths/min  IPAP 16 cmH20  EPAP 8 cmH2O  FiO2 (%) 60 %  Minute Ventilation 15.2  Leak 17  Peak Inspiratory Pressure (PIP) 17  Tidal Volume (Vt) 425  Patient Home Equipment No  Auto Titrate No  Press High Alarm 25 cmH2O  Press Low Alarm 5 cmH2O  Oxygen Percent 60 %  BiPAP/CPAP /SiPAP Vitals  Pulse Rate 91  Resp (!) 32  SpO2 93 %  MEWS Score/Color  MEWS Score 3  MEWS Score Color Yellow

## 2023-09-22 ENCOUNTER — Inpatient Hospital Stay (HOSPITAL_COMMUNITY): Payer: PPO

## 2023-09-22 DIAGNOSIS — E1169 Type 2 diabetes mellitus with other specified complication: Secondary | ICD-10-CM | POA: Diagnosis not present

## 2023-09-22 DIAGNOSIS — N179 Acute kidney failure, unspecified: Secondary | ICD-10-CM | POA: Diagnosis not present

## 2023-09-22 DIAGNOSIS — F419 Anxiety disorder, unspecified: Secondary | ICD-10-CM | POA: Diagnosis not present

## 2023-09-22 DIAGNOSIS — I5033 Acute on chronic diastolic (congestive) heart failure: Secondary | ICD-10-CM | POA: Diagnosis not present

## 2023-09-22 LAB — CBC
HCT: 54.7 % — ABNORMAL HIGH (ref 39.0–52.0)
Hemoglobin: 16.4 g/dL (ref 13.0–17.0)
MCH: 27.1 pg (ref 26.0–34.0)
MCHC: 30 g/dL (ref 30.0–36.0)
MCV: 90.4 fL (ref 80.0–100.0)
Platelets: 196 10*3/uL (ref 150–400)
RBC: 6.05 MIL/uL — ABNORMAL HIGH (ref 4.22–5.81)
RDW: 16 % — ABNORMAL HIGH (ref 11.5–15.5)
WBC: 14.9 10*3/uL — ABNORMAL HIGH (ref 4.0–10.5)
nRBC: 0 % (ref 0.0–0.2)

## 2023-09-22 LAB — BASIC METABOLIC PANEL
Anion gap: 13 (ref 5–15)
BUN: 50 mg/dL — ABNORMAL HIGH (ref 8–23)
CO2: 41 mmol/L — ABNORMAL HIGH (ref 22–32)
Calcium: 9.3 mg/dL (ref 8.9–10.3)
Chloride: 84 mmol/L — ABNORMAL LOW (ref 98–111)
Creatinine, Ser: 1.33 mg/dL — ABNORMAL HIGH (ref 0.61–1.24)
GFR, Estimated: 58 mL/min — ABNORMAL LOW (ref >60)
Glucose, Bld: 176 mg/dL — ABNORMAL HIGH (ref 70–99)
Potassium: 3.7 mmol/L (ref 3.5–5.1)
Sodium: 138 mmol/L (ref 135–145)

## 2023-09-22 LAB — GLUCOSE, CAPILLARY
Glucose-Capillary: 215 mg/dL — ABNORMAL HIGH (ref 70–99)
Glucose-Capillary: 235 mg/dL — ABNORMAL HIGH (ref 70–99)
Glucose-Capillary: 191 mg/dL — ABNORMAL HIGH (ref 70–99)
Glucose-Capillary: 242 mg/dL — ABNORMAL HIGH (ref 70–99)

## 2023-09-22 LAB — COOXEMETRY PANEL
Carboxyhemoglobin: 2.3 % — ABNORMAL HIGH (ref 0.5–1.5)
Methemoglobin: 0.8 % (ref 0.0–1.5)
O2 Saturation: 75.3 %
Total hemoglobin: 10.2 g/dL — ABNORMAL LOW (ref 12.0–16.0)

## 2023-09-22 LAB — MAGNESIUM: Magnesium: 2.5 mg/dL — ABNORMAL HIGH (ref 1.7–2.4)

## 2023-09-22 MED ORDER — SODIUM CHLORIDE 0.9 % IV SOLN: 3.0000 g | Freq: Three times a day (TID) | INTRAVENOUS | Status: DC

## 2023-09-22 MED ORDER — TORSEMIDE 20 MG PO TABS
40.0000 mg | ORAL_TABLET | Freq: Two times a day (BID) | ORAL | Status: DC
  Administered 2023-09-23: 40 mg via ORAL
  Filled 2023-09-22: qty 2

## 2023-09-22 MED ORDER — POTASSIUM CHLORIDE CRYS ER 20 MEQ PO TBCR: 40.0000 meq | EXTENDED_RELEASE_TABLET | Freq: Once | ORAL | Status: DC

## 2023-09-22 MED ORDER — SODIUM CHLORIDE 0.9 % IV SOLN
2.0000 g | INTRAVENOUS | Status: DC
  Administered 2023-09-22 – 2023-09-24 (×3): 2 g via INTRAVENOUS
  Filled 2023-09-22 (×3): qty 20

## 2023-09-22 MED ORDER — FUROSEMIDE 10 MG/ML IJ SOLN
60.0000 mg | Freq: Once | INTRAMUSCULAR | Status: AC
  Administered 2023-09-22: 60 mg via INTRAVENOUS
  Filled 2023-09-22: qty 6

## 2023-09-22 NOTE — Progress Notes (Signed)
Patient ID: Tim Day, male   DOB: [birth year], 72 y.o.   MRN: [id number]     Advanced Heart Failure Rounding Note  PCP-Cardiologist: Rollene Rotunda, MD   Subjective:    DBA weaned to 1.5 yesterday, co-ox 75% this morning.  Creatinine stable 1.33.  He is currently on torsemide 40 mg bid + acetazolamide 250 bid.  CVP 6 this morning. Weight up 6 lbs?, he has been getting weighted in bed.   Currently on BiPAP. Started to have some trouble breathing yesterday after choking a little on his KDUR in pudding mix. Breathing is slowly improving. Primary team gave additional 40 IV lasix this morning.   Objective:   Weight Range: 79.9 kg Body mass index is 25.27 kg/m.   Vital Signs:   Temp:  [97 F (36.1 C)-98.6 F (37 C)] 98.3 F (36.8 C) (10/21 0716) Pulse Rate:  [80-94] 94 (10/21 0716) Resp:  [16-32] 18 (10/21 0738) BP: (91-103)/(69-85) 103/75 (10/21 0716) SpO2:  [93 %-99 %] 95 % (10/21 0716) FiO2 (%):  [40 %-65 %] 40 % (10/21 0441) Weight:  [79.9 kg] 79.9 kg (10/21 0441) Last BM Date : 09/20/23  Weight change: Filed Weights   09/21/23 0035 09/21/23 0410 09/22/23 0441  Weight: 79.7 kg 77.5 kg 79.9 kg    Intake/Output:   Intake/Output Summary (Last 24 hours) at 09/22/2023 1047 Last data filed at 09/22/2023 1005 Gross per 24 hour  Intake 220 ml  Output 2900 ml  Net -2680 ml     CVP 6 Physical Exam  General:  elderly appearing on BiPAP HEENT: normal Neck: supple. JVD ~6 cm. Carotids 2+ bilat; no bruits. No lymphadenopathy or thyromegaly appreciated. Cor: PMI nondisplaced. Regular rate & rhythm. No rubs, gallops or murmurs. Lungs: expiratory wheezes Abdomen: soft, nontender, nondistended. No hepatosplenomegaly. No bruits or masses. Good bowel sounds. Extremities: no cyanosis, clubbing, rash, edema. PICC RUE Neuro: alert & oriented x 3, cranial nerves grossly intact. moves all 4 extremities w/o difficulty. Affect pleasant.   Telemetry   NSR 90s-100 (Personally  reviewed)    Labs    CBC Recent Labs    09/21/23 0455 09/22/23 0529  WBC 11.2* 14.9*  HGB 16.1 16.4  HCT 53.6* 54.7*  MCV 89.5 90.4  PLT 181 196   Basic Metabolic Panel Recent Labs    34/74/25 0455 09/21/23 1839 09/22/23 0529  NA 140 135 138  K 3.0* 3.9 3.7  CL 81* 83* 84*  CO2 44* 41* 41*  GLUCOSE 148* 234* 176*  BUN 43* 49* 50*  CREATININE 1.29* 1.38* 1.33*  CALCIUM 8.7* 8.8* 9.3  MG 2.4  --  2.5*   Liver Function Tests No results for input(s): "AST", "ALT", "ALKPHOS", "BILITOT", "PROT", "ALBUMIN" in the last 72 hours.  No results for input(s): "LIPASE", "AMYLASE" in the last 72 hours. Cardiac Enzymes No results for input(s): "CKTOTAL", "CKMB", "CKMBINDEX", "TROPONINI" in the last 72 hours.  BNP: BNP (last 3 results) Recent Labs    09/14/23 1942  BNP 668.0*    ProBNP (last 3 results) No results for input(s): "PROBNP" in the last 8760 hours.   D-Dimer No results for input(s): "DDIMER" in the last 72 hours. Hemoglobin A1C No results for input(s): "HGBA1C" in the last 72 hours. Fasting Lipid Panel No results for input(s): "CHOL", "HDL", "LDLCALC", "TRIG", "CHOLHDL", "LDLDIRECT" in the last 72 hours. Thyroid Function Tests No results for input(s): "TSH", "T4TOTAL", "T3FREE", "THYROIDAB" in the last 72 hours.  Invalid input(s): "FREET3"  Other results:  Imaging    CT Chest High Resolution  Result Date: 09/21/2023 CLINICAL DATA:  Interstitial lung disease EXAM: CT CHEST WITHOUT CONTRAST TECHNIQUE: Multidetector CT imaging of the chest was performed following the standard protocol without intravenous contrast. High resolution imaging of the lungs, as well as inspiratory and expiratory imaging, was performed. RADIATION DOSE REDUCTION: This exam was performed according to the departmental dose-optimization program which includes automated exposure control, adjustment of the mA and/or kV according to patient size and/or use of iterative reconstruction  technique. COMPARISON:  CTA chest dated May 06, 2022; high-resolution chest CT dated Apr 22, 2022; chest CT dated Apr 04, 2021 FINDINGS: Cardiovascular: Cardiomegaly. No pericardial effusion. Normal caliber thoracic aorta with moderate calcified plaque. Severe coronary artery calcifications. Right central venous line with tip in the lower right atrium at the inferior cavoatrial junction. Mediastinum/Nodes: Esophagus and thyroid are unremarkable. Interval decreased size of AP window lymph node measuring 10 mm on series 11, image 44, previously 14 mm. Unchanged mildly enlarged subcarinal lymph nodes. Lungs/Pleura: Debris of the trachea and right mainstem bronchus. Motion artifact somewhat limits evaluation of the lung parenchyma. New patchy consolidations of the right lower lobe. Bilateral lower lung and peripheral predominant reticular and ground-glass opacities with areas of subpleural sparing. No evidence of progression, although motion artifact limits evaluation. Stable left lower lobe pulmonary nodules measuring 5 mm on series 13, image 68 and 7 mm on image 159, likely benign given multi year stability. Stable tubular opacity of the left upper lobe located on series 15, image 33, consistent with a bronchocele. No evidence of air trapping. Upper Abdomen: No acute abnormality. Musculoskeletal: No chest wall mass or suspicious bone lesions identified. IMPRESSION: 1. New mild patchy consolidations of the right lower lobe, likely due to infection or aspiration. Recommend follow-up chest CT in 3 months to ensure resolution. 2. Debris of the trachea and right mainstem bronchus, consistent with aspiration. 3. 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 Findings are suggestive of an alternative diagnosis (not UIP) per consensus guidelines: Diagnosis of Idiopathic Pulmonary Fibrosis: An Official ATS/ERS/JRS/ALAT Clinical Practice Guideline. Am Rosezetta Schlatter Crit Care  Med Vol 198, Iss 5, [id number], Aug 02 2017. 4. Coronary artery calcifications and aortic Atherosclerosis (ICD10-I70.0). Electronically Signed   By: Allegra Lai M.D.   On: 09/21/2023 17:04     Medications:     Scheduled Medications:  acetaZOLAMIDE  250 mg Oral BID   apixaban  5 mg Oral BID   buPROPion  150 mg Oral Daily   Chlorhexidine Gluconate Cloth  6 each Topical Daily   dapagliflozin propanediol  10 mg Oral Daily   famotidine  20 mg Oral Daily   fluticasone furoate-vilanterol  1 puff Inhalation Daily   insulin aspart  0-9 Units Subcutaneous TID WC   insulin glargine-yfgn  8 Units Subcutaneous Daily   ipratropium-albuterol  3 mL Nebulization TID   potassium chloride  40 mEq Oral Daily   rosuvastatin  5 mg Oral QHS   sertraline  100 mg Oral Daily   sodium chloride flush  10-40 mL Intracatheter Q12H   spironolactone  12.5 mg Oral Daily   torsemide  40 mg Oral BID    Infusions:  DOBUTamine 1.5 mcg/kg/min (09/22/23 0915)    PRN Medications: acetaminophen **OR** acetaminophen, ALPRAZolam, ipratropium-albuterol, ondansetron **OR** ondansetron (ZOFRAN) IV, oxyCODONE, polyvinyl alcohol, sodium chloride flush  Patient Profile  Tim Day is a 72 year old with a history of  PAH, HTN,  DVT, chronic respiratory failure on home oxygen, post COVID pulmonary fibrosis, GERD, DMII, DVT, and diastolic heart failure.    Admitted with A/C HFpEF .  Now with severely reduced RV. Transfer to [facility] for Advanced Heart Failure Team consultation/management, attempt at Palliative diuresis.  Assessment/Plan  1. A/C HFpEF-->Cor Pulmonale  - Echo 2023 RV ok but now severely reduced.  - Echo this admit  LVEF preserved and RV severely reduced - P/w NYHA Class IV symptoms and marked volume overload  - On dobutamine 2.5 for RV support, co-ox 75%.  Now on po diuretics.  Will start weaning dobutamine, decrease to 1.5 today.  - Weight up today? on torsemide 40 bid + acetazolamide 250 bid.  HCO3 41 today.   CVP 6.  Continue regimen, as HCO3 stabilizes will need to stop acetazolamide.  Given an additional 40 mg IV lasix this morning by primary. Will hold PM torsemide and restart tomorrow with low CVP and bad RV - Continue spironolactone 12.5 daily and replace K.  BMET in pm.  - Continue farxiga 10 mg daily.  - Continue unna boots  - Asked him to watch fluid intake   2. Acute Hypoxic Respiratory Failure - Oxygen requirements increased on admit 13 liters-->weaned to 5 liters.  - At home he is on 5 liter [place].  - Now on po diuretics. Given an additional 40 mg IV lasix this morning. Avoid over diuresis with bad RV   3.  Pulmonary HTN with cor pulmonale - Has history of ILD on chronic oxygen + OSA. Chronic respiratory failure on 5 liters oxygen.  - Suspect WHO Group III but needs additional work up => high resolution CT 10/20: Fibrotic interstitial lung disease  - Continue Supp O2    4. AKI -Creatinine stable at 1.29 -Follow BMET    5. H/O DVT - US DVT RLE 2023  - On eliquis chronically, 5 mg bid  - no bleeding   6. PNA vs aspiration  - reports "choking up" on KDUR/pudding mix yesterday - now on BiPAP - CT with patchy consolidations, PNA vs aspiration. Also with debris in the trachea consistent with aspiration. CXR today - Incentive spirometry ordered - now on ceftriaxone per primary  7. GOC  - DNR/ DNI  - Palliative Care following.    Length of Stay: 8  Alen Bleacher, NP  09/22/2023, 10:47 AM  Advanced Heart Failure Team Pager [phone] (M-F; 7a - 5p)  Please contact [facility] for night-coverage after hours (5p -7a ) and weekends on [email or web address]

## 2023-09-22 NOTE — Progress Notes (Addendum)
Progress Note   Patient: Tim Day [id number]:[id number] DOB: [birth year] DOA: 09/14/2023     8 DOS: the patient was seen and examined on 09/22/2023   Brief hospital course: Tim Day was admitted to the hospital with the working diagnosis of heart failure exacerbation.   72 year old M with PMH of diastolic CHF, COPD, chronic hypoxic RF on 4 to 5 L and nightly BiPAP, DM-2, HTN, HLD, DVT on Eliquis, anxiety and depression presenting with shortness of breath and orthopnea, edema and overall decline, and admitted for acute on chronic diastolic CHF.  Overall decline since he had COVID-19 infection many years ago.  His BNP was elevated to 668.  Portable CXR with cardiomegaly, low lung volumes, mild to moderate bibasilar atelectasis and possible infiltrate.  Patient was started on IV Lasix and admitted.   TTE with LVEF of 50%, indeterminate DD, RVSP of 65.7 mmHg, mild to moderate TR. diuresis limited by soft blood pressures.  Cardiology consulted and transferred patient to ICU for inotropes.   10/17 patient on dobutamine infusion and furosemide drip.  10/18 patient responding well to medical therapy.  10/20 improving volume status, and 02 requirements. Start wean off dobutamine.  10/21 worsening dyspnea, continue using Bipap.  High resolution CT with possible pneumonia.   Assessment and Plan: * Acute on chronic diastolic CHF (congestive heart failure) (HCC) Echocardiogram with preserved LV systolic function with EF 50%, interventricular septum is flattened in systole and diastole, RV systolic function with severe reduction, mild to moderate TR, RVSP 65.7 mmHg,   Acute on chronic core pulmonale Pulmonary hypertension Right heart failure.   Urine output is 2,000  ml Systolic blood pressure 90 to 308 mmHg. SV02 75  10/17 Metolazone 5 mg  Continue diuresis with sequential nephron blockade with dapagliflozin, Acetazolamide, spironolactone and torsemide.  Decrease inotropic support with  dobutamine 1.5 mcg/ kg/min.  Will give extra dose of IV furosemide this morning.   Holding on B blockade due to risk of hypotension.   Acute hypoxemic respiratory failure due to acute cardiogenic pulmonary edema and pulmonary hypertension. Intermittent Bipap  High resolution CT with peripheral subpleural sparing fibrotic interstitial infiltrates, small infiltrate at the right lower lobe with no frank air bronchogram   Plan to check chest radiograph today. Wbc is 14,9  Will start empiric antibiotic therapy with Ceftriaxone for 5 days, aspiration pneumonia.  Continue oxymetry monitoring. Hold on steroids for now.   AKI (acute kidney injury) (HCC) Hyponatremia, hypokalemia.   Renal function with serum cr at 1,3 with K at 3,7 and serum bicarbonate at 41, Na 138, Mg 2,5   Add 40 kcl today. Continue diuresis with torsemide, acetazolamide, spironolactone, SGLT 2 inh.  One dose of furosemide IV this am.  Follow up renal function and electrolytes in am.   Anxiety and depression On alprazolam, sertraline and bupropion   Type 2 diabetes mellitus with hyperlipidemia (HCC) Glucose has been stable  Continue with insulin sliding scale and basal (8 units)  Continue with statin therapy.    History of DVT (deep vein thrombosis) Anticoagulation with apixaban.         Subjective: Patient with worsening dyspnea this am, had to be back on Bipap, no chest pain.   Physical Exam: Vitals:   09/22/23 0337 09/22/23 0441 09/22/23 0716 09/22/23 0738  BP:  91/72 103/75   Pulse: 89 88 94   Resp: (!) 30 18 18 18   Temp:  (!) 97 F (36.1 C) 98.3 F (36.8 C)   TempSrc:  Oral  Oral   SpO2: 99% 94% 95%   Weight:  79.9 kg    Height:       Neurology awake and alert ENT with mild pallor Cardiovascular with S1 and S2 present and regular with no gallops or rubs, positive systolic murmur at the right lower sternal border Mild JVD Trace lower extremity edema (unna boots in place) Respiratory with  bilateral rales with no wheezing or rhonchi Abdomen with no distention  Data Reviewed:    Family Communication: I spoke with patient's wife at the bedside, we talked in detail about patient's condition, plan of care and prognosis and all questions were addressed.   Disposition: Status is: Inpatient Remains inpatient appropriate because: heart failure and respiratory failure   Planned Discharge Destination: Home     Author: Coralie Keens, MD 09/22/2023 11:00 AM  For on call review [email or web address].

## 2023-09-22 NOTE — Progress Notes (Addendum)
PT Cancellation Note  Patient Details Name: Tim Day MRN: [id number] DOB: 1951-09-05   Cancelled Treatment:    Reason Eval/Treat Not Completed: Fatigue/lethargy limiting ability to participate - pt on bipap, PT to check back tomorrow.    Marye Round, PT DPT Acute Rehabilitation Services Secure Chat Preferred  Office [phone]    Truddie Coco 09/22/2023, 3:27 PM

## 2023-09-22 NOTE — Progress Notes (Signed)
Orthopedic Tech Progress Note Patient Details:  Tim Day [birth year] [id number]  Ortho Devices Type of Ortho Device: Radio broadcast assistant Ortho Device/Splint Location: BLE Ortho Device/Splint Interventions: Application, Ordered   Post Interventions Patient Tolerated: Well  Lon Klippel OTR/L 09/22/2023, 9:17 AM

## 2023-09-23 DIAGNOSIS — N179 Acute kidney failure, unspecified: Secondary | ICD-10-CM | POA: Diagnosis not present

## 2023-09-23 DIAGNOSIS — F419 Anxiety disorder, unspecified: Secondary | ICD-10-CM | POA: Diagnosis not present

## 2023-09-23 DIAGNOSIS — E1169 Type 2 diabetes mellitus with other specified complication: Secondary | ICD-10-CM | POA: Diagnosis not present

## 2023-09-23 DIAGNOSIS — B370 Candidal stomatitis: Secondary | ICD-10-CM

## 2023-09-23 DIAGNOSIS — I5033 Acute on chronic diastolic (congestive) heart failure: Secondary | ICD-10-CM | POA: Diagnosis not present

## 2023-09-23 DIAGNOSIS — Z515 Encounter for palliative care: Secondary | ICD-10-CM | POA: Diagnosis not present

## 2023-09-23 LAB — GLUCOSE, CAPILLARY
Glucose-Capillary: 175 mg/dL — ABNORMAL HIGH (ref 70–99)
Glucose-Capillary: 286 mg/dL — ABNORMAL HIGH (ref 70–99)
Glucose-Capillary: 140 mg/dL — ABNORMAL HIGH (ref 70–99)
Glucose-Capillary: 227 mg/dL — ABNORMAL HIGH (ref 70–99)

## 2023-09-23 LAB — COOXEMETRY PANEL
Carboxyhemoglobin: 2.1 % — ABNORMAL HIGH (ref 0.5–1.5)
Methemoglobin: <0.7 % (ref 0.0–1.5)
O2 Saturation: 58 %
Total hemoglobin: 17.5 g/dL — ABNORMAL HIGH (ref 12.0–16.0)

## 2023-09-23 LAB — CBC
HCT: 55.9 % — ABNORMAL HIGH (ref 39.0–52.0)
Hemoglobin: 17.2 g/dL — ABNORMAL HIGH (ref 13.0–17.0)
MCH: 28.1 pg (ref 26.0–34.0)
MCHC: 30.8 g/dL (ref 30.0–36.0)
MCV: 91.3 fL (ref 80.0–100.0)
Platelets: 203 10*3/uL (ref 150–400)
RBC: 6.12 MIL/uL — ABNORMAL HIGH (ref 4.22–5.81)
RDW: 16.5 % — ABNORMAL HIGH (ref 11.5–15.5)
WBC: 14.9 10*3/uL — ABNORMAL HIGH (ref 4.0–10.5)
nRBC: 0 % (ref 0.0–0.2)

## 2023-09-23 LAB — BASIC METABOLIC PANEL
Anion gap: 12 (ref 5–15)
BUN: 54 mg/dL — ABNORMAL HIGH (ref 8–23)
CO2: 37 mmol/L — ABNORMAL HIGH (ref 22–32)
Calcium: 8.7 mg/dL — ABNORMAL LOW (ref 8.9–10.3)
Chloride: 90 mmol/L — ABNORMAL LOW (ref 98–111)
Creatinine, Ser: 1.21 mg/dL (ref 0.61–1.24)
GFR, Estimated: >60 mL/min (ref >60)
Glucose, Bld: 164 mg/dL — ABNORMAL HIGH (ref 70–99)
Potassium: 3.6 mmol/L (ref 3.5–5.1)
Sodium: 139 mmol/L (ref 135–145)

## 2023-09-23 LAB — MAGNESIUM: Magnesium: 2.5 mg/dL — ABNORMAL HIGH (ref 1.7–2.4)

## 2023-09-23 MED ORDER — TAMSULOSIN HCL 0.4 MG PO CAPS
0.4000 mg | ORAL_CAPSULE | Freq: Every day | ORAL | Status: DC
  Administered 2023-09-23 – 2023-09-25 (×3): 0.4 mg via ORAL
  Filled 2023-09-23 (×3): qty 1

## 2023-09-23 MED ORDER — NYSTATIN 100000 UNIT/ML MT SUSP
5.0000 mL | Freq: Four times a day (QID) | OROMUCOSAL | Status: DC
  Administered 2023-09-23 – 2023-09-25 (×9): 500000 [IU] via ORAL
  Filled 2023-09-23 (×9): qty 5

## 2023-09-23 NOTE — TOC Progression Note (Signed)
Transition of Care [facility]) - Progression Note    Patient Details  Name: Tim Day MRN: [id number] Date of Birth: 30-Jul-1951  [facility]) CM/SW Contact  Nicanor Bake Phone Number: [phone] 09/23/2023, 10:55 AM  Clinical Narrative:   HF CSW attempted to meet with pt at bedside. Pts nurse was in the room. CSW will follow up with pt and family at a later time.   TOC will continue following.     Expected Discharge Plan: Home w Home Health Services Barriers to Discharge: Continued Medical Work up  Expected Discharge Plan and Services In-house Referral: Clinical Social Work Discharge Planning Services: CM Consult Post Acute Care Choice: Home Health Living arrangements for the past 2 months: Single Family Home                                       Social Determinants of Health (SDOH) Interventions SDOH Screenings   Food Insecurity: No Food Insecurity (09/15/2023)  Housing: Low Risk  (09/15/2023)  Transportation Needs: No Transportation Needs (09/15/2023)  Utilities: Not At Risk (09/15/2023)  Depression (PHQ2-9): Medium Risk (08/25/2023)  Tobacco Use: Low Risk  (09/15/2023)    Readmission Risk Interventions    09/15/2023   12:03 PM  Readmission Risk Prevention Plan  Transportation Screening Complete  Home Care Screening Complete  Medication Review (RN CM) Complete

## 2023-09-23 NOTE — Progress Notes (Deleted)
   09/22/23 2319  BiPAP/CPAP/SIPAP  BiPAP/CPAP/SIPAP Pt Type Adult  Reason BIPAP/CPAP not in use Other(comment) (PRN bipap, pt not in any distress)

## 2023-09-23 NOTE — Progress Notes (Signed)
Daily Progress Note   Patient Name: Tim Day       Date: 09/23/2023 DOB: 1951/11/20  Age: 72 y.o. MRN#: [id number] Attending Physician: Coralie Keens Primary Care Physician: Benita Stabile, MD Admit Date: 09/14/2023  Reason for Consultation/Follow-up: Establishing goals of care  Patient Profile/HPI:   72 y.o. male with past medical history of chronic respiratory failure 4 to 5 L, history of DVT, HTN/HLD, DM2, general decline after 72-day hospital stay for COVID several years ago, GERD, malignant melanoma of the skin admitted on 09/14/2023 with CHF exacerbation.   Subjective: Chart reviewed including labs, progress notes, imaging from this and previous encounters.  Patient with aspiration event early yesterday. But status improving overall.  Patient today sitting up, ambulated. His only complaint is sore throat, hurts when he eats and swallows.  Review of Systems  Constitutional:  Positive for malaise/fatigue.  Respiratory:  Negative for shortness of breath.      Physical Exam Vitals and nursing note reviewed.  Constitutional:      Appearance: He is ill-appearing.  HENT:     Head:     Comments: White patches on tongue    Mouth/Throat:     Pharynx: Oropharyngeal exudate present.  Skin:    General: Skin is warm and dry.  Neurological:     Mental Status: He is alert and oriented to person, place, and time.             Vital Signs: BP 116/86 (BP Location: Left Arm)   Pulse 93   Temp 98 F (36.7 C) (Oral)   Resp 19   Ht 5\' 10"  (1.778 m)   Wt 78.1 kg   SpO2 96%   BMI 24.71 kg/m  SpO2: SpO2: 96 % O2 Device: O2 Device: High Flow Nasal Cannula O2 Flow Rate: O2 Flow Rate (L/min): (S) 6 L/min  Intake/output summary:  Intake/Output Summary (Last 24  hours) at 09/23/2023 1243 Last data filed at 09/23/2023 0919 Gross per 24 hour  Intake 100 ml  Output 1100 ml  Net -1000 ml   LBM: Last BM Date : 09/23/23 Baseline Weight: Weight: 84.4 kg Most recent weight: Weight: 78.1 kg       Palliative Assessment/Data: PPS: 50%      Patient Active Problem List   Diagnosis Date Noted  History of DVT (deep vein thrombosis) 09/15/2023   Anxiety and depression 09/15/2023   AKI (acute kidney injury) (HCC) 09/15/2023   CHF exacerbation (HCC) 09/14/2023   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   Pulmonary HTN (HCC) 05/26/2022   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) 05/26/2022   Pulmonary fibrosis (HCC)    Acute on chronic diastolic CHF (congestive heart failure) (HCC)    Respiratory failure with hypoxia and hypercapnia (HCC) 05/06/2022   DOE (dyspnea on exertion) 04/08/2022   Acute respiratory failure with hypercapnia (HCC) 11/17/2021   Acute metabolic encephalopathy    Hyperglycemia due to diabetes mellitus (HCC)    Viral gastroenteritis    Solitary pulmonary nodule on lung CT 08/02/2021   Elevated troponin I level 07/09/2021   Severe sepsis (HCC) 04/09/2021   Acute cholecystitis 04/09/2021   Shock (HCC)    Acute on chronic respiratory failure with hypoxia (HCC)    Chronic respiratory failure with hypoxia (HCC) 02/09/2021   Sinus arrhythmia 12/26/2020   Adjustment disorder with anxious mood 09/15/2020   Protein-calorie malnutrition, severe 08/25/2020   Primary spontaneous pneumothorax    Pneumonia of both lower lobes due to infectious organism    Pressure injury of skin 08/20/2020   Pneumothorax, left 08/19/2020   Pneumonia due to COVID-19 virus 08/19/2020   Leg DVT (deep venous thromboembolism), acute, bilateral (HCC) 08/19/2020   Sepsis (HCC) 08/08/2020   Hyponatremia 08/08/2020   Hypocalcemia 08/03/2020   Transaminitis 08/03/2020   COVID-19 virus infection 07/22/20 > 02 dep since 08/02/2020    Thrombocytopenia (HCC) 08/02/2020   Leukopenia 08/02/2020   Esophageal reflux    Chest tube in place 01/22/2015   Hypercholesteremia    Hypertension    Cardiogenic shock (HCC) 01/20/2015   Type 2 diabetes mellitus with hyperlipidemia (HCC) 01/20/2015   Benign essential HTN 01/20/2015   Dyslipidemia 01/20/2015    Palliative Care Assessment & Plan    Assessment/Recommendations/Plan  Continue current plan of care- GOC- treat what is treatable Oral thrush- nystatin suspension QID   Code Status: DNR  Prognosis:  Unable to determine  Discharge Planning: To Be Determined  Care plan was discussed with patient and care team.   Thank you for allowing the Palliative Medicine Team to assist in the care of this patient.   Preparing to see the patient (e.g., review of tests) Obtaining and/or reviewing separately obtained history Performing a medically necessary appropriate examination and/or evaluation Counseling and educating the patient/family/caregiver Ordering medications, tests, or procedures Referring and communicating with other health care professionals (when not reported separately) Documenting clinical information in the electronic or other health record Independently interpreting results (not reported separately) and communicating results to the patient/family/caregiver Care coordination (not reported separately) Clinical documentation  Ocie Bob, AGNP-C Palliative Medicine   Please contact Palliative Medicine Team phone at [phone] for questions and concerns.

## 2023-09-23 NOTE — Plan of Care (Signed)
  Problem: Education: Goal: Ability to demonstrate management of disease process will improve Outcome: Progressing   Problem: Education: Goal: Ability to verbalize understanding of medication therapies will improve Outcome: Progressing   

## 2023-09-23 NOTE — Progress Notes (Signed)
PROGRESS NOTE    Tim Day  ZOX:[id number] DOB: 1951-04-10 DOA: 09/14/2023 PCP: Benita Stabile, MD  72/M w diastolic CHF, COPD, chronic hypoxic RF on 4 to 5 L and nightly BiPAP, DM-2, HTN, HLD, DVT on Eliquis, anxiety and depression presenting with shortness of breath and orthopnea, edema and overall decline, and admitted for acute on chronic diastolic CHF.  Overall decline since he had COVID-19 infection many years ago.  BNP- 668.  Portable CXR with cardiomegaly, low lung volumes, mild to moderate bibasilar atelectasis and possible infiltrate.   - started on IV Lasix and admitted. - ECHO w/EF of 50%, indeterminate DD, RVSP of 65.7 mmHg, mild to moderate TR. diuresis limited by soft blood pressures.  Cardiology consulted and transferred patient to ICU for inotropes.    10/17 > dobutamine infusion and furosemide drip.  10/20 improving volume status, and 02 requirements. Start weaning dobutamine.  10/21 worsening dyspnea, continue using Bipap.  High resolution CT with possible pneumonia.  10/22 volume status> improving but continue to use intermittent non invasive mechanical ventilation.   Subjective: Feels fair, no events overnight, breathing is improving, ambulated with PT  Assessment and Plan:  Acute on chronic diastolic CHF Severe Pulm HTN, RV failure -Echo with EF 50%, interventricular septum is flattened in systole and diastole, RV systolic function with severe reduction  -diuresed w/ IV lasix, Metolazone, Dobutamine, he is 18.3 L negative -Clinically appears euvolemic, weaned off dobutamine 10/21 -Continue Farxiga, Aldactone -CHF team following, plan to restart diuretics tomorrow -Discharge planning, remove Unna boots on account of pain -Seen by palliative care, DNR, patient and spouse wish to continue to treat the treatable conditions, home health recommended by PT  Acute hypoxemic respiratory failure -Chronic hypoxia since COVID 2 years ago and 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 (Not present on admission, not associated with sepsis).  10/21 chest radiograph with hypoinflation and bilateral basal interstitial infiltrates.  -On IV ceftriaxone day 2 -Check SLP eval, transition to oral antibiotics tomorrow if stable  AKI (acute kidney injury) (HCC) Hyponatremia, hypokalemia.  -Cardiorenal, improved  Anxiety and depression On alprazolam, sertraline and bupropion   Type 2 diabetes mellitus with hyperlipidemia (HCC) -Stable, continue glargine, SSI  History of DVT (deep vein thrombosis) Anticoagulation with apixaban.   Goals of Care -Palliative Care consulted and following -DNR, continue current TX   DVT prophylaxis: Apixaban Code Status: DNR Family Communication: Wife at bedside Disposition Plan: Home with home health services likely 48 hours  Consultants:    Procedures:   Antimicrobials:    Objective: Vitals:   09/23/23 0344 09/23/23 0732 09/23/23 0755 09/23/23 1748  BP: (!) 123/98 116/86  98/73  Pulse: 88 93  69  Resp: 18 18 19 18   Temp: 97.9 F (36.6 C) 98 F (36.7 C)  98 F (36.7 C)  TempSrc: Axillary Oral  Oral  SpO2: 100% 96%  100%  Weight: 78.1 kg     Height:        Intake/Output Summary (Last 24 hours) at 09/23/2023 1909 Last data filed at 09/23/2023 1359 Gross per 24 hour  Intake --  Output 2100 ml  Net -2100 ml   Filed Weights   09/21/23 0410 09/22/23  0441 09/23/23 0344  Weight: 77.5 kg 79.9 kg 78.1 kg    Examination:  Chronically ill elderly male laying in bed, AAOx3 HEENT: No JVD CVS: S1-S2, regular rhythm Lungs: Few scattered rales Abdomen: Soft, nontender, bowel  sounds present Extremities: No edema, Unna boots noted, right arm PICC line    Data Reviewed:   CBC: Recent Labs  Lab 09/19/23 0500 09/20/23 0430 09/21/23 0455 09/22/23 0529 09/23/23 0505  WBC 11.9* 13.2* 11.2* 14.9* 14.9*  HGB 16.9 16.2 16.1 16.4 17.2*  HCT 53.7* 52.1* 53.6* 54.7* 55.9*  MCV 86.8 87.9 89.5 90.4 91.3  PLT 192 203 181 196 203   Basic Metabolic Panel: Recent Labs  Lab 09/19/23 0500 09/19/23 1955 09/20/23 0430 09/21/23 0455 09/21/23 1839 09/22/23 0529 09/23/23 0505  NA 136   < > 137 140 135 138 139  K 3.1*   < > 3.7 3.0* 3.9 3.7 3.6  CL 82*   < > 78* 81* 83* 84* 90*  CO2 42*   < > 43* 44* 41* 41* 37*  GLUCOSE 146*   < > 158* 148* 234* 176* 164*  BUN 36*   < > 38* 43* 49* 50* 54*  CREATININE 1.30*   < > 1.31* 1.29* 1.38* 1.33* 1.21  CALCIUM 8.4*   < > 9.4 8.7* 8.8* 9.3 8.7*  MG 2.1  --  2.4 2.4  --  2.5* 2.5*   < > = values in this interval not displayed.   GFR: Estimated Creatinine Clearance: 58.7 mL/min (by C-G formula based on SCr of 1.21 mg/dL). Liver Function Tests: Recent Labs  Lab 09/17/23 0503  AST 67*  ALT 98*  ALKPHOS 87  BILITOT 0.7  PROT 6.3*  ALBUMIN 3.1*   No results for input(s): "LIPASE", "AMYLASE" in the last 168 hours. No results for input(s): "AMMONIA" in the last 168 hours. Coagulation Profile: No results for input(s): "INR", "PROTIME" in the last 168 hours. Cardiac Enzymes: No results for input(s): "CKTOTAL", "CKMB", "CKMBINDEX", "TROPONINI" in the last 168 hours. BNP (last 3 results) No results for input(s): "PROBNP" in the last 8760 hours. HbA1C: No results for input(s): "HGBA1C" in the last 72 hours. CBG: Recent Labs  Lab 09/22/23 1532 09/22/23 2041 09/23/23 0528 09/23/23 1026 09/23/23 1543  GLUCAP 191* 242* 175* 286* 140*   Lipid Profile: No results for input(s): "CHOL", "HDL", "LDLCALC", "TRIG", "CHOLHDL", "LDLDIRECT" in the last 72 hours. Thyroid Function Tests: No results for input(s): "TSH",  "T4TOTAL", "FREET4", "T3FREE", "THYROIDAB" in the last 72 hours. Anemia Panel: No results for input(s): "VITAMINB12", "FOLATE", "FERRITIN", "TIBC", "IRON", "RETICCTPCT" in the last 72 hours. Urine analysis:    Component Value Date/Time   COLORURINE YELLOW 11/17/2021 0024   APPEARANCEUR CLEAR 11/17/2021 0024   LABSPEC 1.025 11/17/2021 0024   PHURINE 5.5 11/17/2021 0024   GLUCOSEU >=500 (A) 11/17/2021 0024   HGBUR NEGATIVE 11/17/2021 0024   BILIRUBINUR NEGATIVE 11/17/2021 0024   BILIRUBINUR small (A) 04/06/2021 1435   KETONESUR 40 (A) 11/17/2021 0024   PROTEINUR 100 (A) 11/17/2021 0024   UROBILINOGEN 0.2 04/06/2021 1435   UROBILINOGEN 0.2 01/21/2015 1822   NITRITE NEGATIVE 11/17/2021 0024   LEUKOCYTESUR NEGATIVE 11/17/2021 0024   Sepsis Labs: @LABRCNTIP (procalcitonin:4,lacticidven:4)  ) Recent Results (from the past 240 hour(s))  MRSA Next Gen by PCR, Nasal     Status: None   Collection Time: 09/16/23  9:10 AM   Specimen: Nasal Mucosa; Nasal Swab  Result Value Ref Range Status   MRSA by PCR Next Gen NOT  DETECTED NOT DETECTED Final    Comment: (NOTE) The GeneXpert MRSA Assay (FDA approved for NASAL specimens only), is one component of a comprehensive MRSA colonization surveillance program. It is not intended to diagnose MRSA infection nor to guide or monitor treatment for MRSA infections. Test performance is not FDA approved in patients less than 73 years old. Performed at [facility], [address] [place], [place] [address]      Radiology Studies: DG Chest 1 View  Result Date: 09/22/2023 CLINICAL DATA:  Dyspnea EXAM: CHEST  1 VIEW COMPARISON:  09/14/2023 and CT chest 09/21/2023 FINDINGS: Right-sided PICC line tip: Lower right atrium. Low lung volumes are present, causing crowding of the pulmonary vasculature. Patchy hazy opacities favoring the lung bases, with peripheral reticular opacities diffusely, similar to yesterday's CT scan. Thoracic spondylosis.  Reverse  lordotic projection. IMPRESSION: 1. Low lung volumes with patchy hazy opacities favoring the lung bases, with peripheral reticular opacities diffusely, similar to yesterday's CT scan. 2. Right-sided PICC line tip: Lower right atrium. If placement at the upper cavoatrial junction is desired, retract 6.5 cm. Electronically Signed   By: Gaylyn Rong M.D.   On: 09/22/2023 14:22     Scheduled Meds:  apixaban  5 mg Oral BID   buPROPion  150 mg Oral Daily   Chlorhexidine Gluconate Cloth  6 each Topical Daily   dapagliflozin propanediol  10 mg Oral Daily   famotidine  20 mg Oral Daily   fluticasone furoate-vilanterol  1 puff Inhalation Daily   insulin aspart  0-9 Units Subcutaneous TID WC   insulin glargine-yfgn  8 Units Subcutaneous Daily   ipratropium-albuterol  3 mL Nebulization TID   nystatin  5 mL Oral QID   potassium chloride  40 mEq Oral Daily   rosuvastatin  5 mg Oral QHS   sertraline  100 mg Oral Daily   sodium chloride flush  10-40 mL Intracatheter Q12H   spironolactone  12.5 mg Oral Daily   tamsulosin  0.4 mg Oral Daily   Continuous Infusions:  cefTRIAXone (ROCEPHIN)  IV 2 g (09/23/23 1022)     LOS: 9 days    Time spent:    Zannie Cove, MD [facility]   09/23/2023, 7:09 PM

## 2023-09-23 NOTE — Progress Notes (Signed)
Progress Note   Patient: Tim Day ZOX:[id number] DOB: [birth year] DOA: 09/14/2023     9 DOS: the patient was seen and examined on 09/23/2023   Brief hospital course: Tim Day was admitted to the hospital with the working diagnosis of heart failure exacerbation.   72 year old M with PMH of diastolic CHF, COPD, chronic hypoxic RF on 4 to 5 L and nightly BiPAP, DM-2, HTN, HLD, DVT on Eliquis, anxiety and depression presenting with shortness of breath and orthopnea, edema and overall decline, and admitted for acute on chronic diastolic CHF.  Overall decline since he had COVID-19 infection many years ago.  His BNP was elevated to 668.  Portable CXR with cardiomegaly, low lung volumes, mild to moderate bibasilar atelectasis and possible infiltrate.  Patient was started on IV Lasix and admitted.   TTE with LVEF of 50%, indeterminate DD, RVSP of 65.7 mmHg, mild to moderate TR. diuresis limited by soft blood pressures.  Cardiology consulted and transferred patient to ICU for inotropes.   10/17 patient on dobutamine infusion and furosemide drip.  10/18 patient responding well to medical therapy.  10/20 improving volume status, and 02 requirements. Start wean off dobutamine.  10/21 worsening dyspnea, continue using Bipap.  High resolution CT with possible pneumonia.  10/22 volume status is improving but continue to use intermittent non invasive mechanical ventilation.   Assessment and Plan: * Acute on chronic diastolic CHF (congestive heart failure) (HCC) Echocardiogram with preserved LV systolic function with EF 50%, interventricular septum is flattened in systole and diastole, RV systolic function with severe reduction, mild to moderate TR, RVSP 65.7 mmHg,   Acute on chronic core pulmonale Pulmonary hypertension Right heart failure.   Urine output is 2,000  ml Systolic blood pressure 96 to 811 mmHg. SV02 58  10/17 Metolazone 5 mg  Patient had sequential nephron blockade with  dapagliflozin, Acetazolamide, spironolactone and torsemide.  Currently on spironolactone and SGLT 2 inh.  On hold loop diuretic.  10/22 weaned off dobutamine.   Holding on B blockade due to risk of hypotension.   Acute hypoxemic respiratory failure due to acute cardiogenic pulmonary edema and pulmonary hypertension. Intermittent Bipap  High resolution CT with peripheral subpleural sparing fibrotic interstitial infiltrates, small infiltrate at the right lower lobe with no frank air bronchogram   Aspiration pneumonia. (Not present on admission, not associated with sepsis).  10/21 chest radiograph with hypoinflation and bilateral basal interstitial infiltrates.  Continue antibiotic therapy with ceftriaxone, plan for short course of 5 days.  Continue oxymetry monitoring. Hold on steroids for now.   AKI (acute kidney injury) (HCC) Hyponatremia, hypokalemia.   Renal function with serum cr at 1,21 with K at 3,6 and serum bicarbonate at 37.  Na 139   Add 40 kcl today. Follow up renal function and electrolytes in am.   Anxiety and depression On alprazolam, sertraline and bupropion   Type 2 diabetes mellitus with hyperlipidemia (HCC) Glucose has been stable  Continue with insulin sliding scale and basal (8 units)  Continue with statin therapy.    History of DVT (deep vein thrombosis) Anticoagulation with apixaban.         Subjective: Patient with improvement in dyspnea, but continue to use bipap during the day.   Physical Exam: Vitals:   09/22/23 2351 09/23/23 0344 09/23/23 0732 09/23/23 0755  BP: 96/76 (!) 123/98 116/86   Pulse: 85 88 93   Resp:  18 18 19   Temp: (!) 97.2 F (36.2 C) 97.9 F (36.6 C) 98 F (  36.7 C)   TempSrc: Oral Axillary Oral   SpO2: 95% 100% 96%   Weight:  78.1 kg    Height:       Neurology somnolent at the time of my examination  ENT with pallor, full face mask bipap in place Cardiovascular with S1 and S2 present and regular with no gallops,  rubs or murmurs Respiratory with scattered rhonchi and rales with no wheezing Abdomen with no distention  Trace lower extremity edema (unna boots in place)  Data Reviewed:    Family Communication: I spoke with patient's family at the bedside, we talked in detail about patient's condition, plan of care and prognosis and all questions were addressed.   Disposition: Status is: Inpatient Remains inpatient appropriate because: respiratory failure   Planned Discharge Destination: Home     Author: Coralie Keens, MD 09/23/2023 4:22 PM  For on call review [email or web address].

## 2023-09-23 NOTE — Plan of Care (Signed)
  Problem: Education: Goal: Ability to verbalize understanding of medication therapies will improve Outcome: Progressing   Problem: Fluid Volume: Goal: Ability to maintain a balanced intake and output will improve Outcome: Progressing   Problem: Clinical Measurements: Goal: Respiratory complications will improve Outcome: Progressing

## 2023-09-23 NOTE — Progress Notes (Signed)
   09/23/23 1610  Spiritual Encounters  Type of Visit Initial  Care provided to: Pt and family  Referral source Family  Reason for visit Routine spiritual support   Chaplain visiting Pt at request of [name].  Wife did most of the talking while I was there as the Pt seemed to be having difficulty and in pain.  I spoke with Pt and his difficulty/pain is due to "trying to pee".  After a few minutes the Pt asked for his wife to help with tubing.  Chaplain volunteered to step away while Pt was sttuggling and will come back later for support and conversation.  Chaplain services remain available by Spiritual Consult or for emergent cases, paging [phone]  Chaplain Raelene Bott, MDiv [name] [phone]

## 2023-09-23 NOTE — Progress Notes (Signed)
   09/22/23 2319  BiPAP/CPAP/SIPAP  $ Non-Invasive Ventilator  Non-Invasive Vent Subsequent  BiPAP/CPAP/SIPAP Pt Type Adult  BiPAP/CPAP/SIPAP V60  Mask Type Full face mask  Mask Size Medium  Set Rate 12 breaths/min  Respiratory Rate 30 breaths/min  IPAP 16 cmH20  EPAP 8 cmH2O  FiO2 (%) 40 %  Minute Ventilation 8.6  Leak 20  Peak Inspiratory Pressure (PIP) 17  Tidal Volume (Vt) 356  Patient Home Equipment No  Auto Titrate No  Press High Alarm 25 cmH2O  Press Low Alarm 5 cmH2O  CPAP/SIPAP surface wiped down Yes  BiPAP/CPAP /SiPAP Vitals  SpO2 97 %

## 2023-09-23 NOTE — Progress Notes (Signed)
   09/23/23 1418  Spiritual Encounters  Type of Visit Follow up  Care provided to: Pt and family  Reason for visit Routine spiritual support   Chaplain returned to visit with Pt who was now fast asleep with BiPap on.  Chaplain spoke with Pt wife and brother, exploring with them the Pt's illness and the impact caring for him is having on his wife. Chaplain provided compassionate presence, reflective listening, and the ritual of prayer.  Chaplain services remain available by Spiritual Consult or for emergent cases, paging [phone]  Chaplain Raelene Bott, MDiv [email or web address] [phone]

## 2023-09-23 NOTE — Inpatient Diabetes Management (Signed)
Inpatient Diabetes Program Recommendations  AACE/ADA: New Consensus Statement on Inpatient Glycemic Control   Target Ranges:  Prepandial:   less than 140 mg/dL      Peak postprandial:   less than 180 mg/dL (1-2 hours)      Critically ill patients:  140 - 180 mg/dL    Latest Reference Range & Units 09/22/23 08:13 09/22/23 10:59 09/22/23 15:32 09/22/23 20:41 09/23/23 05:28 09/23/23 10:26  Glucose-Capillary 70 - 99 mg/dL 213 (H) 086 (H) 578 (H) 242 (H) 175 (H) 286 (H)   Review of Glycemic Control  Diabetes history: DM2 Outpatient Diabetes medications: Novolog 2-18 units daily after supper, Toujeo 22 units QAM Current orders for Inpatient glycemic control: Semglee 8 units daily, Novolog 0-9 units TID with meals  Inpatient Diabetes Program Recommendations:    Insulin: Please consider ordering Novolog 3 units TID with meals for meal coverage if patient eats at least 50% of meals.  Thanks, Orlando Penner, RN, MSN, CDCES Diabetes Coordinator Inpatient Diabetes Program [phone] (Team Pager from 8am to 5pm)

## 2023-09-23 NOTE — Progress Notes (Signed)
Patient ID: Tim Day, male   DOB: 1951/11/28, 72 y.o.   MRN: [id number]     Advanced Heart Failure Rounding Note  PCP-Cardiologist: Tim Rotunda, MD   Subjective:    Aspirated 10/22 required bipap. Now much improved today, back on home O2.   DBA turned off yesterday 10/20, co-ox 58% this morning.  Creatinine stable 1.21.  CVP 5, weight down 4lbs. -1.9L UOP.   Sitting up in chair, feels weak and wants to work with PT.   Objective:   Weight Range: 78.1 kg Body mass index is 24.71 kg/m.   Vital Signs:   Temp:  [97.2 F (36.2 C)-99 F (37.2 C)] 98 F (36.7 C) (10/22 0732) Pulse Rate:  [85-98] 93 (10/22 0732) Resp:  [17-25] 19 (10/22 0755) BP: (96-125)/(76-98) 116/86 (10/22 0732) SpO2:  [95 %-100 %] 96 % (10/22 0732) FiO2 (%):  [40 %] 40 % (10/21 2319) Weight:  [78.1 kg] 78.1 kg (10/22 0344) Last BM Date : 09/23/23  Weight change: Filed Weights   09/21/23 0410 09/22/23 0441 09/23/23 0344  Weight: 77.5 kg 79.9 kg 78.1 kg    Intake/Output:   Intake/Output Summary (Last 24 hours) at 09/23/2023 1052 Last data filed at 09/23/2023 0919 Gross per 24 hour  Intake 100 ml  Output 1600 ml  Net -1500 ml     CVP 5 Physical Exam  General:  elderly, frail appearing.   HEENT: 5L [place] Neck: supple. JVD flat. Carotids 2+ bilat; no bruits. No lymphadenopathy or thyromegaly appreciated. Cor: PMI nondisplaced. Regular rate & rhythm. No rubs, gallops or murmurs. Lungs: clear Abdomen: soft, nontender, nondistended. No hepatosplenomegaly. No bruits or masses. Good bowel sounds. Extremities: no cyanosis, clubbing, rash, edema. PICC RUE Neuro: alert & oriented x 3, cranial nerves grossly intact. moves all 4 extremities w/o difficulty. Affect pleasant.   Telemetry   NSR 80s (Personally reviewed)    Labs    CBC Recent Labs    09/22/23 0529 09/23/23 0505  WBC 14.9* 14.9*  HGB 16.4 17.2*  HCT 54.7* 55.9*  MCV 90.4 91.3  PLT 196 203   Basic Metabolic Panel Recent  Labs    09/22/23 0529 09/23/23 0505  NA 138 139  K 3.7 3.6  CL 84* 90*  CO2 41* 37*  GLUCOSE 176* 164*  BUN 50* 54*  CREATININE 1.33* 1.21  CALCIUM 9.3 8.7*  MG 2.5* 2.5*   Liver Function Tests No results for input(s): "AST", "ALT", "ALKPHOS", "BILITOT", "PROT", "ALBUMIN" in the last 72 hours.  No results for input(s): "LIPASE", "AMYLASE" in the last 72 hours. Cardiac Enzymes No results for input(s): "CKTOTAL", "CKMB", "CKMBINDEX", "TROPONINI" in the last 72 hours.  BNP: BNP (last 3 results) Recent Labs    09/14/23 1942  BNP 668.0*    ProBNP (last 3 results) No results for input(s): "PROBNP" in the last 8760 hours.   D-Dimer No results for input(s): "DDIMER" in the last 72 hours. Hemoglobin A1C No results for input(s): "HGBA1C" in the last 72 hours. Fasting Lipid Panel No results for input(s): "CHOL", "HDL", "LDLCALC", "TRIG", "CHOLHDL", "LDLDIRECT" in the last 72 hours. Thyroid Function Tests No results for input(s): "TSH", "T4TOTAL", "T3FREE", "THYROIDAB" in the last 72 hours.  Invalid input(s): "FREET3"  Other results:   Imaging    No results found.   Medications:     Scheduled Medications:  apixaban  5 mg Oral BID   buPROPion  150 mg Oral Daily   Chlorhexidine Gluconate Cloth  6 each Topical Daily  dapagliflozin propanediol  10 mg Oral Daily   famotidine  20 mg Oral Daily   fluticasone furoate-vilanterol  1 puff Inhalation Daily   insulin aspart  0-9 Units Subcutaneous TID WC   insulin glargine-yfgn  8 Units Subcutaneous Daily   ipratropium-albuterol  3 mL Nebulization TID   potassium chloride  40 mEq Oral Daily   rosuvastatin  5 mg Oral QHS   sertraline  100 mg Oral Daily   sodium chloride flush  10-40 mL Intracatheter Q12H   spironolactone  12.5 mg Oral Daily   tamsulosin  0.4 mg Oral Daily    Infusions:  cefTRIAXone (ROCEPHIN)  IV 2 g (09/23/23 1022)    PRN Medications: acetaminophen **OR** acetaminophen, ALPRAZolam,  ipratropium-albuterol, oxyCODONE, polyvinyl alcohol, sodium chloride flush  Patient Profile  Mr Tim Day is a 72 year old with a history of  PAH, HTN, DVT, chronic respiratory failure on home oxygen, post COVID pulmonary fibrosis, GERD, DMII, DVT, and diastolic heart failure.    Admitted with A/C HFpEF .  Now with severely reduced RV. Transfer to [facility] for Advanced Heart Failure Team consultation/management, attempt at Palliative diuresis.  Assessment/Plan  1. A/C HFpEF-->Cor Pulmonale  - Echo 2023 RV ok but now severely reduced.  - Echo this admit  LVEF preserved and RV severely reduced - P/w NYHA Class IV symptoms and marked volume overload  - DBA off 10/21. Co-ox 58% - Weight down 4lbs. CVP 5. Hold diuretics today with bad RV. Plan to restart diuretic later on, volume status stable  - Continue spironolactone 12.5 daily and replace K.  BMET in pm.  - Continue farxiga 10 mg daily.  - Continue unna boots  - Continue to watch fluid intake   2. Acute Hypoxic Respiratory Failure - Oxygen requirements increased on admit 13 liters-->weaned to 5 liters.  - At home he is on 5 liter [place].  - Improved with diuresis   3.  Pulmonary HTN with cor pulmonale - Has history of ILD on chronic oxygen + OSA. Chronic respiratory failure on 5 liters oxygen.  - Suspect WHO Group III but needs additional work up => high resolution CT 10/20: Fibrotic interstitial lung disease  - Continue Supp O2    4. AKI -Creatinine stable at 1.21 -Follow BMET    5. H/O DVT - US DVT RLE 2023  - On eliquis chronically, 5 mg bid  - no bleeding   6. PNA vs aspiration  - reports "choking up" on KDUR/pudding mix 10/20 - CT with patchy consolidations, PNA vs aspiration. Also with debris in the trachea consistent with aspiration.  - Incentive spirometry ordered - now on ceftriaxone per primary - Appears it has significantly improved today  7. GOC  - DNR/ DNI  - Palliative Care following.   Needs to work with PT/OT  today   Length of Stay: [address], NP  09/23/2023, 10:52 AM  Advanced Heart Failure Team Pager [phone] (M-F; 7a - 5p)  Please contact [facility] for night-coverage after hours (5p -7a ) and weekends on [email or web address]

## 2023-09-23 NOTE — Progress Notes (Signed)
Physical Therapy Treatment Patient Details Name: Tim Day MRN: [id number] DOB: Oct 26, 1951 Today's Date: 09/23/2023   History of Present Illness 72 y/o male presented [facility] 09/14/23 with SOB, edema, and overall decline. Admitted with acute on chronic CHF with stage 2 pressure injury on L buttocks. Pt in ICU from 10/13-10/15. Transferred to [facility] on 10/16 with palliative care following. PMH: chronic hypoxia (O2 dependent), DM, HTN, HLD, hx of COVID, COPD, DVT    PT Comments  Pt's family state pt is doing better today, but still is fatigued. Pt reports no pain throughout session. Pt progressed well to hallway ambulation this date with min encouragement and use of RW, overall requiring light assist for mobility at this time. Pt and family encouraged by pt mobility this date. SpO2 92% and greater on 6LO2, brief period of tachycardia up to 140s-160s post-gait but not sustained and pt asymptomatic, RN notified and pt resting in 80s-90s upon PT exit. PT to continue to follow.       If plan is discharge home, recommend the following: A little help with walking and/or transfers;A little help with bathing/dressing/bathroom;Help with stairs or ramp for entrance;Assistance with cooking/housework   Can travel by private vehicle        Equipment Recommendations  None recommended by PT    Recommendations for Other Services       Precautions / Restrictions Precautions Precautions: Fall Restrictions Weight Bearing Restrictions: No     Mobility  Bed Mobility Overal bed mobility: Needs Assistance             General bed mobility comments: up in chair    Transfers Overall transfer level: Needs assistance Equipment used: Rolling walker (2 wheels) Transfers: Sit to/from Stand Sit to Stand: Min assist           General transfer comment: assist for power up and steadying. static standing tolerance x5 minutes    Ambulation/Gait Ambulation/Gait assistance: Min  assist Gait Distance (Feet): 80 Feet Assistive device: Rolling walker (2 wheels) Gait Pattern/deviations: Trunk flexed, Step-through pattern, Decreased stride length Gait velocity: decr     General Gait Details: assist to steady, manage lines/leads, hallway navigation. cues for upright posture, placement in RW, breathing technique, standing rest breaks as needed   Stairs             Wheelchair Mobility     Tilt Bed    Modified Rankin (Stroke Patients Only)       Balance Overall balance assessment: Needs assistance Sitting-balance support: Feet supported, No upper extremity supported Sitting balance-Leahy Scale: Fair     Standing balance support: Single extremity supported, During functional activity, Reliant on assistive device for balance Standing balance-Leahy Scale: Poor                              Cognition Arousal: Alert Behavior During Therapy: WFL for tasks assessed/performed Overall Cognitive Status: Within Functional Limits for tasks assessed                                 General Comments: increased processing time        Exercises Other Exercises Other Exercises: PT encouraged seated marches and LAQs when less fatigued, pt agreeable    General Comments General comments (skin integrity, edema, etc.): SpO2 92% and greater on 6LO2, brief period of tachycardia up to 140s-160s post-gait but not  sustained and pt asymptomatic, RN notified and pt resting in 80s-90s upon PT exit.      Pertinent Vitals/Pain Pain Assessment Pain Assessment: No/denies pain    Home Living                          Prior Function            PT Goals (current goals can now be found in the care plan section) Acute Rehab PT Goals Patient Stated Goal: return home with family to assist PT Goal Formulation: With patient/family Time For Goal Achievement: 09/24/23 Potential to Achieve Goals: Good Progress towards PT goals: Progressing  toward goals    Frequency    Min 1X/week      PT Plan      Co-evaluation              AM-PAC PT "6 Clicks" Mobility   Outcome Measure  Help needed turning from your back to your side while in a flat bed without using bedrails?: A Little Help needed moving from lying on your back to sitting on the side of a flat bed without using bedrails?: A Little Help needed moving to and from a bed to a chair (including a wheelchair)?: A Little Help needed standing up from a chair using your arms (e.g., wheelchair or bedside chair)?: A Little Help needed to walk in hospital room?: A Little Help needed climbing 3-5 steps with a railing? : A Lot 6 Click Score: 17    End of Session Equipment Utilized During Treatment: Oxygen Activity Tolerance: Patient limited by fatigue Patient left: with call bell/phone within reach;with family/visitor present;in chair (no chair alarm since pt's family to stay in room and ensure pt will not get up without assist) Nurse Communication: Mobility status PT Visit Diagnosis: Unsteadiness on feet (R26.81);Other abnormalities of gait and mobility (R26.89);Muscle weakness (generalized) (M62.81)     Time: 4098-1191 PT Time Calculation (min) (ACUTE ONLY): 29 min  Charges:    $Gait Training: 8-22 mins $Therapeutic Activity: 8-22 mins PT General Charges $$ ACUTE PT VISIT: 1 Visit                     Marye Round, PT DPT Acute Rehabilitation Services Secure Chat Preferred  Office [phone]    Truddie Coco 09/23/2023, 12:33 PM

## 2023-09-24 DIAGNOSIS — I5033 Acute on chronic diastolic (congestive) heart failure: Secondary | ICD-10-CM | POA: Diagnosis not present

## 2023-09-24 DIAGNOSIS — J9601 Acute respiratory failure with hypoxia: Secondary | ICD-10-CM | POA: Diagnosis not present

## 2023-09-24 DIAGNOSIS — N179 Acute kidney failure, unspecified: Secondary | ICD-10-CM | POA: Diagnosis not present

## 2023-09-24 DIAGNOSIS — I272 Pulmonary hypertension, unspecified: Secondary | ICD-10-CM | POA: Diagnosis not present

## 2023-09-24 LAB — COOXEMETRY PANEL
Carboxyhemoglobin: 2 % — ABNORMAL HIGH (ref 0.5–1.5)
Methemoglobin: <0.7 % (ref 0.0–1.5)
O2 Saturation: 67.3 %
Total hemoglobin: 15.6 g/dL (ref 12.0–16.0)

## 2023-09-24 LAB — BASIC METABOLIC PANEL
Anion gap: 10 (ref 5–15)
BUN: 53 mg/dL — ABNORMAL HIGH (ref 8–23)
CO2: 36 mmol/L — ABNORMAL HIGH (ref 22–32)
Calcium: 8.3 mg/dL — ABNORMAL LOW (ref 8.9–10.3)
Chloride: 88 mmol/L — ABNORMAL LOW (ref 98–111)
Creatinine, Ser: 1.2 mg/dL (ref 0.61–1.24)
GFR, Estimated: >60 mL/min (ref >60)
Glucose, Bld: 132 mg/dL — ABNORMAL HIGH (ref 70–99)
Potassium: 3.6 mmol/L (ref 3.5–5.1)
Sodium: 134 mmol/L — ABNORMAL LOW (ref 135–145)

## 2023-09-24 LAB — MAGNESIUM: Magnesium: 2.5 mg/dL — ABNORMAL HIGH (ref 1.7–2.4)

## 2023-09-24 LAB — GLUCOSE, CAPILLARY
Glucose-Capillary: 137 mg/dL — ABNORMAL HIGH (ref 70–99)
Glucose-Capillary: 183 mg/dL — ABNORMAL HIGH (ref 70–99)
Glucose-Capillary: 186 mg/dL — ABNORMAL HIGH (ref 70–99)

## 2023-09-24 NOTE — Plan of Care (Signed)
  Problem: Education: Goal: Ability to demonstrate management of disease process will improve Outcome: Progressing   Problem: Education: Goal: Ability to verbalize understanding of medication therapies will improve Outcome: Progressing   

## 2023-09-24 NOTE — Progress Notes (Signed)
Patient ID: Tim Day, male   DOB: [birth year], 72 y.o.   MRN: [id number]     Advanced Heart Failure Rounding Note  PCP-Cardiologist: Tim Rotunda, MD   Subjective:    Aspirated 10/22 required bipap. Diuretics held.   Able to walk 80 feet with PT. Asking for  a different oxygen concentrator.    Objective:   Weight Range: 78.5 kg Body mass index is 24.83 kg/m.   Vital Signs:   Temp:  [97 F (36.1 C)-98 F (36.7 C)] 97.6 F (36.4 C) (10/23 0833) Pulse Rate:  [69-81] 81 (10/23 0833) Resp:  [18-25] 20 (10/23 0833) BP: (90-98)/(65-73) 96/66 (10/23 0833) SpO2:  [95 %-100 %] 95 % (10/23 0852) FiO2 (%):  [40 %] 40 % (10/22 2114) Weight:  [78.5 kg] 78.5 kg (10/23 0500) Last BM Date : 09/23/23  Weight change: Filed Weights   09/22/23 0441 09/23/23 0344 09/24/23 0500  Weight: 79.9 kg 78.1 kg 78.5 kg    Intake/Output:   Intake/Output Summary (Last 24 hours) at 09/24/2023 0941 Last data filed at 09/23/2023 1957 Gross per 24 hour  Intake --  Output 1500 ml  Net -1500 ml     CVP 7-8  Physical Exam  General:  Appears weak. No resp difficulty HEENT: normal Neck: supple. JVP 7-8 . Carotids 2+ bilat; no bruits. No lymphadenopathy or thryomegaly appreciated. Cor: PMI nondisplaced. Regular rate & rhythm. No rubs, gallops or murmurs. Lungs: clear on 5 liters  Abdomen: soft, nontender, nondistended. No hepatosplenomegaly. No bruits or masses. Good bowel sounds. Extremities: no cyanosis, clubbing, rash, edema. RUE PiCC.  Neuro: alert & orientedx3, cranial nerves grossly intact. moves all 4 extremities w/o difficulty. Affect flat   Telemetry   SR 80s   Labs    CBC Recent Labs    09/22/23 0529 09/23/23 0505  WBC 14.9* 14.9*  HGB 16.4 17.2*  HCT 54.7* 55.9*  MCV 90.4 91.3  PLT 196 203   Basic Metabolic Panel Recent Labs    40/98/11 0505 09/24/23 0517  NA 139 134*  K 3.6 3.6  CL 90* 88*  CO2 37* 36*  GLUCOSE 164* 132*  BUN 54* 53*  CREATININE 1.21  1.20  CALCIUM 8.7* 8.3*  MG 2.5* 2.5*   Liver Function Tests No results for input(s): "AST", "ALT", "ALKPHOS", "BILITOT", "PROT", "ALBUMIN" in the last 72 hours.  No results for input(s): "LIPASE", "AMYLASE" in the last 72 hours. Cardiac Enzymes No results for input(s): "CKTOTAL", "CKMB", "CKMBINDEX", "TROPONINI" in the last 72 hours.  BNP: BNP (last 3 results) Recent Labs    09/14/23 1942  BNP 668.0*    ProBNP (last 3 results) No results for input(s): "PROBNP" in the last 8760 hours.   D-Dimer No results for input(s): "DDIMER" in the last 72 hours. Hemoglobin A1C No results for input(s): "HGBA1C" in the last 72 hours. Fasting Lipid Panel No results for input(s): "CHOL", "HDL", "LDLCALC", "TRIG", "CHOLHDL", "LDLDIRECT" in the last 72 hours. Thyroid Function Tests No results for input(s): "TSH", "T4TOTAL", "T3FREE", "THYROIDAB" in the last 72 hours.  Invalid input(s): "FREET3"  Other results:   Imaging    No results found.   Medications:     Scheduled Medications:  apixaban  5 mg Oral BID   buPROPion  150 mg Oral Daily   Chlorhexidine Gluconate Cloth  6 each Topical Daily   dapagliflozin propanediol  10 mg Oral Daily   famotidine  20 mg Oral Daily   fluticasone furoate-vilanterol  1 puff Inhalation Daily  insulin aspart  0-9 Units Subcutaneous TID WC   insulin glargine-yfgn  8 Units Subcutaneous Daily   ipratropium-albuterol  3 mL Nebulization TID   nystatin  5 mL Oral QID   potassium chloride  40 mEq Oral Daily   rosuvastatin  5 mg Oral QHS   sertraline  100 mg Oral Daily   sodium chloride flush  10-40 mL Intracatheter Q12H   spironolactone  12.5 mg Oral Daily   tamsulosin  0.4 mg Oral Daily    Infusions:  cefTRIAXone (ROCEPHIN)  IV 2 g (09/23/23 1022)    PRN Medications: acetaminophen **OR** acetaminophen, ALPRAZolam, ipratropium-albuterol, oxyCODONE, polyvinyl alcohol, sodium chloride flush  Patient Profile  Tim Day is a 72 year old  with a history of  PAH, HTN, DVT, chronic respiratory failure on home oxygen, post COVID pulmonary fibrosis, GERD, DMII, DVT, and diastolic heart failure.    Admitted with A/C HFpEF .  Now with severely reduced RV. Transfer to [facility] for Advanced Heart Failure Team consultation/management, attempt at Palliative diuresis.  Assessment/Plan  1. A/C HFpEF-->Cor Pulmonale  - Echo 2023 RV ok but now severely reduced.  - Echo this admit  LVEF preserved and RV severely reduced - P/w NYHA Class IV symptoms and marked volume overload  -Mixed venous sat remains stable off Dobutamine. Discontinue CO-OX  - Appears euvolemic. Overall diuresed 22 pounds. CVP 7-8. Hold torsemide today. Tomorrow start torsemide 20 mg daily. - No room for titration with soft SBP.  Renal function stable.  -- Continue spironolactone 12.5 daily  - Continue farxiga 10 mg daily.  - Continue unna boots    2. Acute Hypoxic Respiratory Failure - Oxygen requirements increased on admit 13 liters-->weaned to 5 liters.  - At home he is on 5 liter [place].  - Improved with diuresis   3.  Pulmonary HTN with cor pulmonale - Has history of ILD on chronic oxygen + OSA. Chronic respiratory failure on 5 liters oxygen.  - Suspect WHO Group III but needs additional work up => high resolution CT 10/20: Fibrotic interstitial lung disease  - Continue Supp O2    4. AKI -Creatinine stable at 1.2  -Follow BMET    5. H/O DVT - US DVT RLE 2023  - On eliquis chronically, 5 mg bid  -No bleeding.   6. PNA vs aspiration  - reports "choking up" on KDUR/pudding mix 10/20 - CT with patchy consolidations, PNA vs aspiration. Also with debris in the trachea consistent with aspiration.  - Completing ceftriaxone per primary - Sats stable.   7. GOC  - DNR/ DNI  - Palliative Care following.       Length of Stay: 10  Tonye Becket, NP  09/24/2023, 9:41 AM  Advanced Heart Failure Team Pager [phone] (M-F; 7a - 5p)  Please contact [facility] for  night-coverage after hours (5p -7a ) and weekends on [email or web address]

## 2023-09-24 NOTE — Progress Notes (Signed)
   09/24/23 1635  Assess: MEWS Score  Temp (!) 97.1 F (36.2 C)  BP 94/66  MAP (mmHg) 76  Pulse Rate 63  ECG Heart Rate 64  Resp (!) 27  Level of Consciousness Alert  SpO2 100 %  O2 Device HFNC  Patient Activity (if Appropriate) In bed  O2 Flow Rate (L/min) 4 L/min  Assess: MEWS Score  MEWS Temp 0  MEWS Systolic 1  MEWS Pulse 0  MEWS RR 2  MEWS LOC 0  MEWS Score 3  MEWS Score Color Yellow  Assess: if the MEWS score is Yellow or Red  Were vital signs accurate and taken at a resting state? Yes  Does the patient meet 2 or more of the SIRS criteria? No  MEWS guidelines implemented  Yes, yellow  Treat  MEWS Interventions Considered administering scheduled or prn medications/treatments as ordered  Take Vital Signs  Increase Vital Sign Frequency  Yellow: Q2hr x1, continue Q4hrs until patient remains green for 12hrs  Escalate  MEWS: Escalate Yellow: Discuss with charge nurse and consider notifying provider and/or RRT  Assess: SIRS CRITERIA  SIRS Temperature  0  SIRS Pulse 0  SIRS Respirations  1  SIRS WBC 0  SIRS Score Sum  1   Patient flagging yellow MEWS. Stable, asymptomatic, Charge nurse notified, will continue to monitor.

## 2023-09-24 NOTE — Progress Notes (Signed)
Mobility Specialist Progress Note:    09/24/23 1128  Mobility  Activity Transferred from bed to chair  Level of Assistance Moderate assist, patient does 50-74%  Assistive Device Front wheel walker  Distance Ambulated (ft) 3 ft  Activity Response Tolerated well  Mobility Referral Yes  $Mobility charge 1 Mobility  Mobility Specialist Start Time (ACUTE ONLY) 1128  Mobility Specialist Stop Time (ACUTE ONLY) 1100  Mobility Specialist Time Calculation (min) (ACUTE ONLY) 1412 min   Pt received in bed, wife at bedside. Agreeable to mobility session. Required ModA +2 to stand with RW. Transferred to chair, all needs met, call bell in reach.   Feliciana Rossetti Mobility Specialist Please contact via Special educational needs teacher or  Rehab office at [phone]
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
## 2023-09-24 NOTE — Progress Notes (Signed)
Occupational Therapy Treatment Patient Details Name: Tim Day MRN: [id number] DOB: [birth year] Today's Date: 09/24/2023   History of present illness 72 y/o male presented to [facility] 09/14/23 with SOB, edema, and overall decline. Admitted with acute on chronic CHF with stage 2 pressure injury on L buttocks. Pt in ICU from 10/13-10/15. Transferred to [facility] on 10/16 with palliative care following. PMH: chronic hypoxia (O2 dependent), DM, HTN, HLD, hx of COVID, COPD, DVT   OT comments  OT session focused on training pt in techniques for increased safety and independence with ADLs. Pt verbalized understanding of all training and demonstrated understanding through teach back but continues to require min cues for technique and safety during functional tasks. Pt largely completing ADLs with Set up to Mod assist and functional transfers/mobility short distances with a RW with Contact guard to Min assist and frequent rest breaks. Pt participated well in session and is making progress toward goals but was limited this session by decreased activity tolerance, fatigue, and lethargy. Pt's VSS largely stable on 6L continuous O2 through HFNC throughout session with brief noted tachycardia into low 100s in activities in standing. Pt will benefit from continued acute skilled OT services to address deficits outlined below and increase safety and independence with functional tasks. Post acute discharge, pt will benefit from continued skilled OT services in the home.       If plan is discharge home, recommend the following:  A little help with walking and/or transfers;A lot of help with bathing/dressing/bathroom;Assistance with cooking/housework;Assist for transportation;Help with stairs or ramp for entrance;Assistance with feeding (Set up for self feeding)   Equipment Recommendations  None recommended by OT (Pt already has all needed equipment)    Recommendations for Other Services      Precautions /  Restrictions Precautions Precautions: Fall Restrictions Weight Bearing Restrictions: No       Mobility Bed Mobility Overal bed mobility: Needs Assistance             General bed mobility comments: sitting in recliner at beginning and end of session    Transfers Overall transfer level: Needs assistance Equipment used: Rolling walker (2 wheels) Transfers: Bed to chair/wheelchair/BSC, Sit to/from Stand Sit to Stand: Min assist     Step pivot transfers: Contact guard assist, Min assist (with RW)     General transfer comment: Min assist to power up then CGA in standing     Balance Overall balance assessment: Needs assistance Sitting-balance support: No upper extremity supported, Feet supported Sitting balance-Leahy Scale: Fair     Standing balance support: Single extremity supported, Bilateral upper extremity supported, During functional activity, Reliant on assistive device for balance Standing balance-Leahy Scale: Poor Standing balance comment: reliant on RW                           ADL either performed or assessed with clinical judgement   ADL Overall ADL's : Needs assistance/impaired Eating/Feeding: Set up;Sitting   Grooming: Moderate assistance;Standing;Minimal assistance (Largely Min assist with grooming in standing but requiring Mod assist to finish shaving with electric shaver secondary to fatigue)                   Toilet Transfer: Contact guard assist;Minimal assistance;BSC/3in1;Rolling walker (2 wheels) (step-pivot transfer)   Toileting- Clothing Manipulation and Hygiene: Moderate assistance;Sit to/from stand;Contact guard assist (CGA for clothing management and Mod assist for peri care)         General ADL Comments:  Pt with decreased activity tolerance affecting functional level with ADLs and functional mobility/transfers.    Extremity/Trunk Assessment Upper Extremity Assessment Upper Extremity Assessment: Right hand  dominant;Generalized weakness   Lower Extremity Assessment Lower Extremity Assessment: Defer to PT evaluation        Vision       Perception     Praxis      Cognition Arousal: Lethargic Behavior During Therapy: WFL for tasks assessed/performed Overall Cognitive Status: Within Functional Limits for tasks assessed                                 General Comments: AAOx4 and pleasant throughouts. Requiring increased processing time.        Exercises      Shoulder Instructions       General Comments O2 sat >/91% on 6L continuous O2 through HFNC with HR largely in the 90s with brief noted periods of tachycardia into low 100s. RN present during a portion of session. Family and friends in and out of room during session.    Pertinent Vitals/ Pain       Pain Assessment Pain Assessment: Faces Faces Pain Scale: Hurts a little bit Pain Location: B LE Pain Descriptors / Indicators: Aching, Grimacing Pain Intervention(s): Limited activity within patient's tolerance, Monitored during session, Repositioned  Home Living                                          Prior Functioning/Environment              Frequency  Min 1X/week        Progress Toward Goals  OT Goals(current goals can now be found in the care plan section)  Progress towards OT goals: Progressing toward goals  Acute Rehab OT Goals Patient Stated Goal: to return home and feel better  Plan      Co-evaluation                 AM-PAC OT "6 Clicks" Daily Activity     Outcome Measure   Help from another person eating meals?: A Little Help from another person taking care of personal grooming?: A Little Help from another person toileting, which includes using toliet, bedpan, or urinal?: A Lot Help from another person bathing (including washing, rinsing, drying)?: A Lot Help from another person to put on and taking off regular upper body clothing?: A Little Help from  another person to put on and taking off regular lower body clothing?: A Lot 6 Click Score: 15    End of Session Equipment Utilized During Treatment: Gait belt;Rolling walker (2 wheels);Oxygen  OT Visit Diagnosis: Unsteadiness on feet (R26.81);Other abnormalities of gait and mobility (R26.89);Muscle weakness (generalized) (M62.81);Other (comment) (decreased activity tolerance)   Activity Tolerance Patient tolerated treatment well;Patient limited by fatigue;Patient limited by lethargy   Patient Left in chair;with call bell/phone within reach;with chair alarm set;with family/visitor present   Nurse Communication Mobility status        Time: 1610-9604 OT Time Calculation (min): 42 min  Charges: OT General Charges $OT Visit: 1 Visit OT Treatments $Self Care/Home Management : 38-52 mins  Coltyn Hanning "Orson Eva., OTR/L, MA Acute Rehab [phone]   Lendon Colonel 09/24/2023, 12:59 PM

## 2023-09-24 NOTE — TOC Progression Note (Addendum)
Transition of Care [facility]) - Progression Note    Patient Details  Name: Tim Day MRN: [id number] Date of Birth: 1951/01/08  Transition of Care [facility]) CM/SW Contact  Elliot Cousin, RN Phone Number: [phone] 09/24/2023, 5:49 PM  Clinical Narrative:   CM spoke to pt's wife. Offered choice for [facility], wife states he had [facility] in the past. She wants [facility] for [facility]. Contacted [employer] rep with new referral. Pt concentrator at home goes to 5 L, pt has been up to 6 L in hospital. Pt will need concentrator that goes up to 10 L. Contacted [employer] rep, Ashley with new request. Pt has Rollator, RW, bedside commode, oxygen and scale at home. Wife will provide transportation home. Will need HH orders.   Beatris Ship accepted referral for [facility]. Received confirmation for [employer] rep, Morrie Sheldon they will change out concentrator to higher liter flow.     Expected Discharge Plan: Home w Home Health Services Barriers to Discharge: Continued Medical Work up  Expected Discharge Plan and Services In-house Referral: Clinical Social Work Discharge Planning Services: CM Consult Post Acute Care Choice: Home Health Living arrangements for the past 2 months: Single Family Home                   DME Agency: Patsy Lager Date DME Agency Contacted: 09/24/23 Time DME Agency Contacted: [phone] Representative spoke with at DME Agency: Morrie Sheldon HH Arranged: RN, PT [facility] Agency: [facility] Date [facility] Agency Contacted: 09/24/23 Time HH Agency Contacted: [phone] Representative spoke with at [facility] Agency: Kandee Keen   Social Determinants of Health (SDOH) Interventions SDOH Screenings   Food Insecurity: No Food Insecurity (09/15/2023)  Housing: Low Risk  (09/15/2023)  Transportation Needs: No Transportation Needs (09/15/2023)  Utilities: Not At Risk (09/15/2023)  Depression (PHQ2-9): Medium Risk (08/25/2023)  Tobacco Use: Low Risk  (09/15/2023)    Readmission Risk Interventions    09/15/2023   12:03 PM   Readmission Risk Prevention Plan  Transportation Screening Complete  Home Care Screening Complete  Medication Review (RN CM) Complete

## 2023-09-25 ENCOUNTER — Other Ambulatory Visit (HOSPITAL_COMMUNITY): Payer: Self-pay

## 2023-09-25 ENCOUNTER — Inpatient Hospital Stay (HOSPITAL_COMMUNITY): Payer: PPO

## 2023-09-25 DIAGNOSIS — I5033 Acute on chronic diastolic (congestive) heart failure: Secondary | ICD-10-CM | POA: Diagnosis not present

## 2023-09-25 DIAGNOSIS — I272 Pulmonary hypertension, unspecified: Secondary | ICD-10-CM | POA: Diagnosis not present

## 2023-09-25 DIAGNOSIS — J9601 Acute respiratory failure with hypoxia: Secondary | ICD-10-CM | POA: Diagnosis not present

## 2023-09-25 DIAGNOSIS — N179 Acute kidney failure, unspecified: Secondary | ICD-10-CM | POA: Diagnosis not present

## 2023-09-25 LAB — BASIC METABOLIC PANEL
Anion gap: 10 (ref 5–15)
BUN: 45 mg/dL — ABNORMAL HIGH (ref 8–23)
CO2: 35 mmol/L — ABNORMAL HIGH (ref 22–32)
Calcium: 8.6 mg/dL — ABNORMAL LOW (ref 8.9–10.3)
Chloride: 92 mmol/L — ABNORMAL LOW (ref 98–111)
Creatinine, Ser: 1.04 mg/dL (ref 0.61–1.24)
GFR, Estimated: >60 mL/min (ref >60)
Glucose, Bld: 164 mg/dL — ABNORMAL HIGH (ref 70–99)
Potassium: 3.9 mmol/L (ref 3.5–5.1)
Sodium: 137 mmol/L (ref 135–145)

## 2023-09-25 LAB — GLUCOSE, CAPILLARY
Glucose-Capillary: 204 mg/dL — ABNORMAL HIGH (ref 70–99)
Glucose-Capillary: 143 mg/dL — ABNORMAL HIGH (ref 70–99)
Glucose-Capillary: 179 mg/dL — ABNORMAL HIGH (ref 70–99)

## 2023-09-25 LAB — MAGNESIUM: Magnesium: 2.5 mg/dL — ABNORMAL HIGH (ref 1.7–2.4)

## 2023-09-25 MED ORDER — NYSTATIN 100000 UNIT/ML MT SUSP
5.0000 mL | Freq: Four times a day (QID) | OROMUCOSAL | 0 refills | Status: AC
  Filled 2023-09-25: qty 60, 3d supply, fill #0

## 2023-09-25 MED ORDER — SPIRONOLACTONE 25 MG PO TABS
12.5000 mg | ORAL_TABLET | Freq: Every day | ORAL | 0 refills | Status: DC
  Filled 2023-09-25: qty 30, 60d supply, fill #0

## 2023-09-25 MED ORDER — TORSEMIDE 20 MG PO TABS
20.0000 mg | ORAL_TABLET | Freq: Every day | ORAL | 0 refills | Status: DC
  Filled 2023-09-25: qty 30, 30d supply, fill #0

## 2023-09-25 MED ORDER — TORSEMIDE 20 MG PO TABS
20.0000 mg | ORAL_TABLET | Freq: Every day | ORAL | Status: DC
  Administered 2023-09-25: 20 mg via ORAL
  Filled 2023-09-25: qty 1

## 2023-09-25 MED ORDER — CEFDINIR 300 MG PO CAPS
300.0000 mg | ORAL_CAPSULE | Freq: Two times a day (BID) | ORAL | Status: DC
  Administered 2023-09-25: 300 mg via ORAL
  Filled 2023-09-25: qty 1

## 2023-09-25 MED ORDER — CEFDINIR 300 MG PO CAPS
300.0000 mg | ORAL_CAPSULE | Freq: Two times a day (BID) | ORAL | 0 refills | Status: DC
  Filled 2023-09-25: qty 2, 1d supply, fill #0

## 2023-09-25 MED ORDER — CEFDINIR 300 MG PO CAPS
300.0000 mg | ORAL_CAPSULE | Freq: Two times a day (BID) | ORAL | 0 refills | Status: AC
  Filled 2023-09-25: qty 6, 3d supply, fill #0

## 2023-09-25 MED ORDER — TOUJEO MAX SOLOSTAR 300 UNIT/ML ~~LOC~~ SOPN: 10.0000 [IU] | PEN_INJECTOR | Freq: Every morning | SUBCUTANEOUS | Status: DC

## 2023-09-25 MED ORDER — POTASSIUM CHLORIDE CRYS ER 20 MEQ PO TBCR
40.0000 meq | EXTENDED_RELEASE_TABLET | Freq: Every day | ORAL | 0 refills | Status: DC
  Filled 2023-09-25: qty 60, 30d supply, fill #0

## 2023-09-25 MED ORDER — DAPAGLIFLOZIN PROPANEDIOL 10 MG PO TABS
10.0000 mg | ORAL_TABLET | Freq: Every day | ORAL | 0 refills | Status: DC
  Filled 2023-09-25: qty 30, 30d supply, fill #0

## 2023-09-25 NOTE — Progress Notes (Signed)
   09/24/23 2255  BiPAP/CPAP/SIPAP  $ Non-Invasive Ventilator  Non-Invasive Vent Subsequent  $ Face Mask Medium Yes  BiPAP/CPAP/SIPAP Pt Type Adult  BiPAP/CPAP/SIPAP V60  Mask Type Full face mask  Mask Size Medium  Set Rate 12 breaths/min  Respiratory Rate 26 breaths/min  IPAP 16 cmH20  EPAP 8 cmH2O  FiO2 (%) 40 %  Minute Ventilation 13.7  Leak 3  Peak Inspiratory Pressure (PIP) 17  Tidal Volume (Vt) 519  Patient Home Equipment No  Auto Titrate No  Press High Alarm 25 cmH2O  Press Low Alarm 5 cmH2O  CPAP/SIPAP surface wiped down Yes  BiPAP/CPAP /SiPAP Vitals  Pulse Rate 77  Resp (!) 23  SpO2 100 %  MEWS Score/Color  MEWS Score 2  MEWS Score Color Yellow

## 2023-09-25 NOTE — Progress Notes (Signed)
SATURATION QUALIFICATIONS: (This note is used to comply with regulatory documentation for home oxygen)  Patient Saturations on Room Air at Rest = 96%, 6L [place]  Patient Saturations on Room Air while Ambulating = 88%, 8L [place]  Patient Saturations on 10 Liters of oxygen while Ambulating = 92%  Please briefly explain why patient needs home oxygen: Patient desat while ambulating with staff in hall.

## 2023-09-25 NOTE — Plan of Care (Addendum)
  Problem: Education: Goal: Ability to demonstrate management of disease process will improve Outcome: Progressing Goal: Ability to verbalize understanding of medication therapies will improve Outcome: Progressing   Problem: Fluid Volume: Goal: Ability to maintain a balanced intake and output will improve Outcome: Progressing   Problem: Skin Integrity: Goal: Risk for impaired skin integrity will decrease Outcome: Progressing   Problem: Clinical Measurements: Goal: Cardiovascular complication will be avoided Outcome: Progressing

## 2023-09-25 NOTE — Discharge Summary (Signed)
Care, [facility] Follow up.   Specialty: Home Health Services Why: Home Health RN and Physical Therapy-agency will call with appt times Contact information: [address] [place] [address] [phone]         [facility] Follow up.   Why: will change concentrator at home to higher liter flow Contact information: [address] [place] [address] [phone]         Benita Stabile, MD. Go on 10/03/2023.   Specialty: Internal Medicine Why: @11 :55am Contact information: [address] [place] [address] [phone]                  The results of significant diagnostics from this hospitalization (including imaging, microbiology, ancillary and laboratory) are listed below for reference.    Significant Diagnostic Studies: DG Swallowing Func-Speech Pathology  Result Date: 09/25/2023 Table formatting from the original result was not included. Modified Barium Swallow Study Patient Details Name: Tim Day MRN: [id number] Date of Birth: [birth year] Today's Date: 09/25/2023 HPI/PMH: HPI: Patient is a 72 y.o. 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  He has had an overall decline since having Covid-19 in 2021 resulting in a 72 day hospital admission. Portable CXR with cardiomegaly, low lung volumes, mild to moderate bibasilar atelectasis and possible infiltrate. He was having  improving oxygenation on 10/20 but on 10/21 he had worsening dyspnea with possible PNA. 10/21 chest radiograph with hypoinflation and bilateral basal interstitial infiltrates. SLP evaluation of swallow ordered on 10/23. Clinical Impression: Clinical Impression: PAtient seen by SLP for modified barium swallow study. Patient was alert and agreeable to this evaluation. He presents with an overall functional oropharyngeal swallow. No insatnces of aspiration or penetration were pbsevred over the course of this study. Diffuse pharyngeal residuals were obsevred, but did not result in post prandial aspiration and cleared after multiple swallows. Incomplete epiglottic inversion was noted, however, adequate airway portection was achieved. Pharyngeal edema appeared to result in phayrngeal wall narrowing. Swallow initiation was delayed to the pyriform sinuses with thin liquid administration and to the vallecula with all other consistencies. SLP recommending patietn continue with regular diet and thin liquids at this time. Given his risk of post prandial aspiration, patient's wife was educated on aspiration precautiosn during and after meals (e.g., remain upright 30 minutes after a meal). No further ST is indicated. Factors that may increase risk of adverse event in presence of aspiration Rubye Oaks & Clearance Coots 2021): Factors that may increase risk of adverse event in presence of aspiration Rubye Oaks & Clearance Coots 2021): Poor general health and/or compromised immunity Recommendations/Plan: Swallowing Evaluation Recommendations Swallowing Evaluation Recommendations Recommendations: PO diet PO Diet Recommendation: Regular; Thin liquids (Level 0) Liquid Administration via: Cup; Straw; Spoon Medication Administration: Whole meds with puree (As needed) Supervision: Patient able to self-feed; Set-up assistance for safety Swallowing strategies  : Small bites/sips; Slow rate; Minimize environmental distractions Postural changes: Position pt fully  upright for meals; Stay upright 30-60 min after meals Oral care recommendations: Oral care BID (2x/day) Treatment Plan Treatment Plan Treatment recommendations: No treatment recommended at this time Follow-up recommendations: No SLP follow up Functional status assessment: Patient has not had a recent decline in their functional status. Recommendations Recommendations for follow up therapy  Physician Discharge Summary  Tim Day WUX:[id number] DOB: 1951/07/23 DOA: 09/14/2023  PCP: Benita Stabile, MD  Admit date: 09/14/2023 Discharge date: 09/25/2023  Time spent: 45 minutes  Recommendations for Outpatient Follow-up:  Advanced heart failure clinic in 2 weeks Outpatient palliative care  Discharge Diagnoses:  Principal Problem:   Acute on chronic diastolic CHF (congestive heart failure) (HCC) Severe pulmonary hypertension Severe RV failure Acute on chronic hypoxic respiratory failure Interstitial lung disease Aspiration pneumonia   AKI (acute kidney injury) (HCC)   Type 2 diabetes mellitus with hyperlipidemia (HCC)   Anxiety and depression   History of DVT (deep vein thrombosis)   Discharge Condition: Improved  Diet recommendation: Low-salt DM, heart healthy  Filed Weights   09/22/23 0441 09/23/23 0344 09/24/23 0500  Weight: 79.9 kg 78.1 kg 78.5 kg    History of present illness:  72/M w diastolic CHF, COPD, chronic hypoxic RF on 4 to 5 L and nightly BiPAP, DM-2, HTN, HLD, DVT on Eliquis, anxiety and depression presenting with shortness of breath and orthopnea, edema and overall decline, and admitted for acute on chronic diastolic CHF.  Overall decline since he had COVID-19 infection many years ago.  BNP- 668.  Portable CXR with cardiomegaly, low lung volumes, mild to moderate bibasilar atelectasis and possible infiltrate.   - started on IV Lasix and admitted. - ECHO w/EF of 50%, indeterminate DD, RVSP of 65.7 mmHg, mild to moderate TR. diuresis limited by soft blood pressures.  Cardiology consulted and transferred patient to ICU for inotropes.    10/17 > dobutamine infusion and furosemide drip.  10/20 improving volume status, and 02 requirements. Start weaning dobutamine.  10/21 worsening dyspnea, continue using Bipap.  High resolution CT with possible pneumonia.  10/22 volume status> improving but continue to use intermittent non invasive mechanical  ventilation.     Hospital Course:  Acute on chronic diastolic CHF Severe Pulm HTN, RV failure -Echo with EF 50%, interventricular septum is flattened in systole and diastole, RV systolic function with severe reduction  -diuresed w/ IV lasix, Metolazone, Dobutamine, he is 18.3 L negative -Now euvolemic, weaned off dobutamine 10/21 -Continue Farxiga, Aldactone -CHF team following, resuming torsemide -Discharged home in stable condition, patient however has significant chronic disease burden, recommend palliative care follow-up   Acute hypoxemic respiratory failure -Chronic hypoxia since COVID 2 years ago and likely has ILD from this -At baseline on 3.5-4 L recently increased to 5 -Acutely worsened in the setting of volume overload, PHTN, aspiration PNA -Weaned O2 down to 4 L at rest, however required up to 8 L with activity to keep sats around 88% or above High resolution CT with peripheral subpleural sparing fibrotic interstitial infiltrates, small infiltrate at the right lower lobe with no frank air bronchogram  -Diuretics as above, complete antibiotic course   Aspiration pneumonia. (Not present on admission, not associated with sepsis).  10/21 chest radiograph with hypoinflation and bilateral basal interstitial infiltrates.  -On day 3 of ceftriaxone, changed to oral cefuroxime to complete 5-day course -SLP eval completed today, had modified barium swallow, noted to have chronic pharyngeal dysphagia, speech therapy educated patient and spouse on swallowing techniques and recommended to continue regular diet at this time   AKI (acute kidney injury) (HCC) Hyponatremia, hypokalemia.  -Cardiorenal, improved   Anxiety and depression On alprazolam, sertraline and bupropion    Type 2 diabetes mellitus with hyperlipidemia (HCC) -Stable, continue glargine, SSI   History of DVT (deep vein thrombosis) Anticoagulation with apixaban.    Goals  located on series 15, image 33, consistent with a bronchocele. No evidence of air trapping. Upper Abdomen: No acute abnormality. Musculoskeletal: No chest wall mass or suspicious bone lesions identified. IMPRESSION: 1. New mild patchy consolidations of the right lower lobe, likely due to infection or aspiration. Recommend follow-up chest CT in 3 months to ensure resolution. 2. Debris of the trachea and right mainstem bronchus, consistent with aspiration. 3. 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 Findings are suggestive of an alternative diagnosis (not UIP) per consensus guidelines: Diagnosis of Idiopathic Pulmonary Fibrosis: An Official ATS/ERS/JRS/ALAT Clinical Practice Guideline. Am Rosezetta Schlatter Crit Care Med Vol 198, Iss 5, [phone], Aug 02 2017. 4. Coronary artery calcifications and aortic Atherosclerosis (ICD10-I70.0). Electronically Signed   By: Allegra Lai M.D.   On: 09/21/2023 17:04   [place] EKG SITE RITE  Result Date: 09/16/2023 If Site Rite image not attached, placement could not be confirmed due to current cardiac rhythm.  ECHOCARDIOGRAM COMPLETE  Result Date:  09/15/2023    ECHOCARDIOGRAM REPORT   Patient Name:   Tim Day Date of Exam: 09/15/2023 Medical Rec #:  [id number]            Height:       70.0 in Accession #:    [id number]           Weight:       175.3 lb Date of Birth:  [birth year]            BSA:          1.973 m Patient Age:    72 years             BP:           104/87 mmHg Patient Gender: M                    HR:           75 bpm. [facility] Procedure: 2D Echo, Cardiac Doppler, Color Doppler and Intracardiac            Opacification Agent REPORT CONTAINS CRITICAL RESULT Indications:    I50.40* Unspecified combined systolic (congestive) and diastolic                 (congestive) heart failure  History:        Patient has prior history of Echocardiogram examinations, most                 recent 09/19/2022. CHF, Pulmonary HTN, Signs/Symptoms:Dyspnea,                 Shortness of Breath and Edema; Risk Factors:Dyslipidemia,                 Diabetes and Hypertension. Cardiac shock. Pneumothorax (left).  Sonographer:    Sheralyn Boatman RDCS Referring Phys: [id number] ASIA B ZIERLE-GHOSH  Sonographer Comments: Technically difficult study due to poor echo windows. IMPRESSIONS  1. Left ventricular ejection fraction, by estimation, is 50%. The left ventricle has low normal function. Left ventricular endocardial border not optimally defined to evaluate regional wall motion. Left ventricular diastolic parameters are indeterminate. There is the interventricular septum is flattened in systole and diastole, consistent with right ventricular pressure and volume overload.  2. Right ventricular systolic function is severely reduced. The right ventricular size is severely enlarged. There is severely elevated pulmonary artery systolic pressure.  3. The mitral valve is normal in structure. No  located on series 15, image 33, consistent with a bronchocele. No evidence of air trapping. Upper Abdomen: No acute abnormality. Musculoskeletal: No chest wall mass or suspicious bone lesions identified. IMPRESSION: 1. New mild patchy consolidations of the right lower lobe, likely due to infection or aspiration. Recommend follow-up chest CT in 3 months to ensure resolution. 2. Debris of the trachea and right mainstem bronchus, consistent with aspiration. 3. 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 Findings are suggestive of an alternative diagnosis (not UIP) per consensus guidelines: Diagnosis of Idiopathic Pulmonary Fibrosis: An Official ATS/ERS/JRS/ALAT Clinical Practice Guideline. Am Rosezetta Schlatter Crit Care Med Vol 198, Iss 5, [phone], Aug 02 2017. 4. Coronary artery calcifications and aortic Atherosclerosis (ICD10-I70.0). Electronically Signed   By: Allegra Lai M.D.   On: 09/21/2023 17:04   [place] EKG SITE RITE  Result Date: 09/16/2023 If Site Rite image not attached, placement could not be confirmed due to current cardiac rhythm.  ECHOCARDIOGRAM COMPLETE  Result Date:  09/15/2023    ECHOCARDIOGRAM REPORT   Patient Name:   Tim Day Date of Exam: 09/15/2023 Medical Rec #:  [id number]            Height:       70.0 in Accession #:    [id number]           Weight:       175.3 lb Date of Birth:  [birth year]            BSA:          1.973 m Patient Age:    72 years             BP:           104/87 mmHg Patient Gender: M                    HR:           75 bpm. [facility] Procedure: 2D Echo, Cardiac Doppler, Color Doppler and Intracardiac            Opacification Agent REPORT CONTAINS CRITICAL RESULT Indications:    I50.40* Unspecified combined systolic (congestive) and diastolic                 (congestive) heart failure  History:        Patient has prior history of Echocardiogram examinations, most                 recent 09/19/2022. CHF, Pulmonary HTN, Signs/Symptoms:Dyspnea,                 Shortness of Breath and Edema; Risk Factors:Dyslipidemia,                 Diabetes and Hypertension. Cardiac shock. Pneumothorax (left).  Sonographer:    Sheralyn Boatman RDCS Referring Phys: [id number] ASIA B ZIERLE-GHOSH  Sonographer Comments: Technically difficult study due to poor echo windows. IMPRESSIONS  1. Left ventricular ejection fraction, by estimation, is 50%. The left ventricle has low normal function. Left ventricular endocardial border not optimally defined to evaluate regional wall motion. Left ventricular diastolic parameters are indeterminate. There is the interventricular septum is flattened in systole and diastole, consistent with right ventricular pressure and volume overload.  2. Right ventricular systolic function is severely reduced. The right ventricular size is severely enlarged. There is severely elevated pulmonary artery systolic pressure.  3. The mitral valve is normal in structure. No  [facility], [facility] Follow up.   Specialty: Home Health Services Why: Home Health RN and Physical Therapy-agency will call with appt times Contact information: [address] [place] [address] [phone]         [facility] Follow up.   Why: will change concentrator at home to higher liter flow Contact information: [address] [place] [address] [phone]         Benita Stabile, MD. Go on 10/03/2023.   Specialty: Internal Medicine Why: @11 :55am Contact information: [address] [place] [address] [phone]                  The results of significant diagnostics from this hospitalization (including imaging, microbiology, ancillary and laboratory) are listed below for reference.    Significant Diagnostic Studies: DG Swallowing Func-Speech Pathology  Result Date: 09/25/2023 Table formatting from the original result was not included. Modified Barium Swallow Study Patient Details Name: Tim Day MRN: [id number] Date of Birth: [birth year] Today's Date: 09/25/2023 HPI/PMH: HPI: Patient is a 72 y.o. 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  He has had an overall decline since having Covid-19 in 2021 resulting in a 72 day hospital admission. Portable CXR with cardiomegaly, low lung volumes, mild to moderate bibasilar atelectasis and possible infiltrate. He was having  improving oxygenation on 10/20 but on 10/21 he had worsening dyspnea with possible PNA. 10/21 chest radiograph with hypoinflation and bilateral basal interstitial infiltrates. SLP evaluation of swallow ordered on 10/23. Clinical Impression: Clinical Impression: PAtient seen by SLP for modified barium swallow study. Patient was alert and agreeable to this evaluation. He presents with an overall functional oropharyngeal swallow. No insatnces of aspiration or penetration were pbsevred over the course of this study. Diffuse pharyngeal residuals were obsevred, but did not result in post prandial aspiration and cleared after multiple swallows. Incomplete epiglottic inversion was noted, however, adequate airway portection was achieved. Pharyngeal edema appeared to result in phayrngeal wall narrowing. Swallow initiation was delayed to the pyriform sinuses with thin liquid administration and to the vallecula with all other consistencies. SLP recommending patietn continue with regular diet and thin liquids at this time. Given his risk of post prandial aspiration, patient's wife was educated on aspiration precautiosn during and after meals (e.g., remain upright 30 minutes after a meal). No further ST is indicated. Factors that may increase risk of adverse event in presence of aspiration Rubye Oaks & Clearance Coots 2021): Factors that may increase risk of adverse event in presence of aspiration Rubye Oaks & Clearance Coots 2021): Poor general health and/or compromised immunity Recommendations/Plan: Swallowing Evaluation Recommendations Swallowing Evaluation Recommendations Recommendations: PO diet PO Diet Recommendation: Regular; Thin liquids (Level 0) Liquid Administration via: Cup; Straw; Spoon Medication Administration: Whole meds with puree (As needed) Supervision: Patient able to self-feed; Set-up assistance for safety Swallowing strategies  : Small bites/sips; Slow rate; Minimize environmental distractions Postural changes: Position pt fully  upright for meals; Stay upright 30-60 min after meals Oral care recommendations: Oral care BID (2x/day) Treatment Plan Treatment Plan Treatment recommendations: No treatment recommended at this time Follow-up recommendations: No SLP follow up Functional status assessment: Patient has not had a recent decline in their functional status. Recommendations Recommendations for follow up therapy  [facility], [facility] Follow up.   Specialty: Home Health Services Why: Home Health RN and Physical Therapy-agency will call with appt times Contact information: [address] [place] [address] [phone]         [facility] Follow up.   Why: will change concentrator at home to higher liter flow Contact information: [address] [place] [address] [phone]         Benita Stabile, MD. Go on 10/03/2023.   Specialty: Internal Medicine Why: @11 :55am Contact information: [address] [place] [address] [phone]                  The results of significant diagnostics from this hospitalization (including imaging, microbiology, ancillary and laboratory) are listed below for reference.    Significant Diagnostic Studies: DG Swallowing Func-Speech Pathology  Result Date: 09/25/2023 Table formatting from the original result was not included. Modified Barium Swallow Study Patient Details Name: Tim Day MRN: [id number] Date of Birth: [birth year] Today's Date: 09/25/2023 HPI/PMH: HPI: Patient is a 72 y.o. 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  He has had an overall decline since having Covid-19 in 2021 resulting in a 72 day hospital admission. Portable CXR with cardiomegaly, low lung volumes, mild to moderate bibasilar atelectasis and possible infiltrate. He was having  improving oxygenation on 10/20 but on 10/21 he had worsening dyspnea with possible PNA. 10/21 chest radiograph with hypoinflation and bilateral basal interstitial infiltrates. SLP evaluation of swallow ordered on 10/23. Clinical Impression: Clinical Impression: PAtient seen by SLP for modified barium swallow study. Patient was alert and agreeable to this evaluation. He presents with an overall functional oropharyngeal swallow. No insatnces of aspiration or penetration were pbsevred over the course of this study. Diffuse pharyngeal residuals were obsevred, but did not result in post prandial aspiration and cleared after multiple swallows. Incomplete epiglottic inversion was noted, however, adequate airway portection was achieved. Pharyngeal edema appeared to result in phayrngeal wall narrowing. Swallow initiation was delayed to the pyriform sinuses with thin liquid administration and to the vallecula with all other consistencies. SLP recommending patietn continue with regular diet and thin liquids at this time. Given his risk of post prandial aspiration, patient's wife was educated on aspiration precautiosn during and after meals (e.g., remain upright 30 minutes after a meal). No further ST is indicated. Factors that may increase risk of adverse event in presence of aspiration Rubye Oaks & Clearance Coots 2021): Factors that may increase risk of adverse event in presence of aspiration Rubye Oaks & Clearance Coots 2021): Poor general health and/or compromised immunity Recommendations/Plan: Swallowing Evaluation Recommendations Swallowing Evaluation Recommendations Recommendations: PO diet PO Diet Recommendation: Regular; Thin liquids (Level 0) Liquid Administration via: Cup; Straw; Spoon Medication Administration: Whole meds with puree (As needed) Supervision: Patient able to self-feed; Set-up assistance for safety Swallowing strategies  : Small bites/sips; Slow rate; Minimize environmental distractions Postural changes: Position pt fully  upright for meals; Stay upright 30-60 min after meals Oral care recommendations: Oral care BID (2x/day) Treatment Plan Treatment Plan Treatment recommendations: No treatment recommended at this time Follow-up recommendations: No SLP follow up Functional status assessment: Patient has not had a recent decline in their functional status. Recommendations Recommendations for follow up therapy  [facility], [facility] Follow up.   Specialty: Home Health Services Why: Home Health RN and Physical Therapy-agency will call with appt times Contact information: [address] [place] [address] [phone]         [facility] Follow up.   Why: will change concentrator at home to higher liter flow Contact information: [address] [place] [address] [phone]         Benita Stabile, MD. Go on 10/03/2023.   Specialty: Internal Medicine Why: @11 :55am Contact information: [address] [place] [address] [phone]                  The results of significant diagnostics from this hospitalization (including imaging, microbiology, ancillary and laboratory) are listed below for reference.    Significant Diagnostic Studies: DG Swallowing Func-Speech Pathology  Result Date: 09/25/2023 Table formatting from the original result was not included. Modified Barium Swallow Study Patient Details Name: Tim Day MRN: [id number] Date of Birth: [birth year] Today's Date: 09/25/2023 HPI/PMH: HPI: Patient is a 72 y.o. 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  He has had an overall decline since having Covid-19 in 2021 resulting in a 72 day hospital admission. Portable CXR with cardiomegaly, low lung volumes, mild to moderate bibasilar atelectasis and possible infiltrate. He was having  improving oxygenation on 10/20 but on 10/21 he had worsening dyspnea with possible PNA. 10/21 chest radiograph with hypoinflation and bilateral basal interstitial infiltrates. SLP evaluation of swallow ordered on 10/23. Clinical Impression: Clinical Impression: PAtient seen by SLP for modified barium swallow study. Patient was alert and agreeable to this evaluation. He presents with an overall functional oropharyngeal swallow. No insatnces of aspiration or penetration were pbsevred over the course of this study. Diffuse pharyngeal residuals were obsevred, but did not result in post prandial aspiration and cleared after multiple swallows. Incomplete epiglottic inversion was noted, however, adequate airway portection was achieved. Pharyngeal edema appeared to result in phayrngeal wall narrowing. Swallow initiation was delayed to the pyriform sinuses with thin liquid administration and to the vallecula with all other consistencies. SLP recommending patietn continue with regular diet and thin liquids at this time. Given his risk of post prandial aspiration, patient's wife was educated on aspiration precautiosn during and after meals (e.g., remain upright 30 minutes after a meal). No further ST is indicated. Factors that may increase risk of adverse event in presence of aspiration Rubye Oaks & Clearance Coots 2021): Factors that may increase risk of adverse event in presence of aspiration Rubye Oaks & Clearance Coots 2021): Poor general health and/or compromised immunity Recommendations/Plan: Swallowing Evaluation Recommendations Swallowing Evaluation Recommendations Recommendations: PO diet PO Diet Recommendation: Regular; Thin liquids (Level 0) Liquid Administration via: Cup; Straw; Spoon Medication Administration: Whole meds with puree (As needed) Supervision: Patient able to self-feed; Set-up assistance for safety Swallowing strategies  : Small bites/sips; Slow rate; Minimize environmental distractions Postural changes: Position pt fully  upright for meals; Stay upright 30-60 min after meals Oral care recommendations: Oral care BID (2x/day) Treatment Plan Treatment Plan Treatment recommendations: No treatment recommended at this time Follow-up recommendations: No SLP follow up Functional status assessment: Patient has not had a recent decline in their functional status. Recommendations Recommendations for follow up therapy  located on series 15, image 33, consistent with a bronchocele. No evidence of air trapping. Upper Abdomen: No acute abnormality. Musculoskeletal: No chest wall mass or suspicious bone lesions identified. IMPRESSION: 1. New mild patchy consolidations of the right lower lobe, likely due to infection or aspiration. Recommend follow-up chest CT in 3 months to ensure resolution. 2. Debris of the trachea and right mainstem bronchus, consistent with aspiration. 3. 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 Findings are suggestive of an alternative diagnosis (not UIP) per consensus guidelines: Diagnosis of Idiopathic Pulmonary Fibrosis: An Official ATS/ERS/JRS/ALAT Clinical Practice Guideline. Am Rosezetta Schlatter Crit Care Med Vol 198, Iss 5, [phone], Aug 02 2017. 4. Coronary artery calcifications and aortic Atherosclerosis (ICD10-I70.0). Electronically Signed   By: Allegra Lai M.D.   On: 09/21/2023 17:04   [place] EKG SITE RITE  Result Date: 09/16/2023 If Site Rite image not attached, placement could not be confirmed due to current cardiac rhythm.  ECHOCARDIOGRAM COMPLETE  Result Date:  09/15/2023    ECHOCARDIOGRAM REPORT   Patient Name:   Tim Day Date of Exam: 09/15/2023 Medical Rec #:  [id number]            Height:       70.0 in Accession #:    [id number]           Weight:       175.3 lb Date of Birth:  [birth year]            BSA:          1.973 m Patient Age:    72 years             BP:           104/87 mmHg Patient Gender: M                    HR:           75 bpm. [facility] Procedure: 2D Echo, Cardiac Doppler, Color Doppler and Intracardiac            Opacification Agent REPORT CONTAINS CRITICAL RESULT Indications:    I50.40* Unspecified combined systolic (congestive) and diastolic                 (congestive) heart failure  History:        Patient has prior history of Echocardiogram examinations, most                 recent 09/19/2022. CHF, Pulmonary HTN, Signs/Symptoms:Dyspnea,                 Shortness of Breath and Edema; Risk Factors:Dyslipidemia,                 Diabetes and Hypertension. Cardiac shock. Pneumothorax (left).  Sonographer:    Sheralyn Boatman RDCS Referring Phys: [id number] ASIA B ZIERLE-GHOSH  Sonographer Comments: Technically difficult study due to poor echo windows. IMPRESSIONS  1. Left ventricular ejection fraction, by estimation, is 50%. The left ventricle has low normal function. Left ventricular endocardial border not optimally defined to evaluate regional wall motion. Left ventricular diastolic parameters are indeterminate. There is the interventricular septum is flattened in systole and diastole, consistent with right ventricular pressure and volume overload.  2. Right ventricular systolic function is severely reduced. The right ventricular size is severely enlarged. There is severely elevated pulmonary artery systolic pressure.  3. The mitral valve is normal in structure. No  located on series 15, image 33, consistent with a bronchocele. No evidence of air trapping. Upper Abdomen: No acute abnormality. Musculoskeletal: No chest wall mass or suspicious bone lesions identified. IMPRESSION: 1. New mild patchy consolidations of the right lower lobe, likely due to infection or aspiration. Recommend follow-up chest CT in 3 months to ensure resolution. 2. Debris of the trachea and right mainstem bronchus, consistent with aspiration. 3. 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 Findings are suggestive of an alternative diagnosis (not UIP) per consensus guidelines: Diagnosis of Idiopathic Pulmonary Fibrosis: An Official ATS/ERS/JRS/ALAT Clinical Practice Guideline. Am Rosezetta Schlatter Crit Care Med Vol 198, Iss 5, [phone], Aug 02 2017. 4. Coronary artery calcifications and aortic Atherosclerosis (ICD10-I70.0). Electronically Signed   By: Allegra Lai M.D.   On: 09/21/2023 17:04   [place] EKG SITE RITE  Result Date: 09/16/2023 If Site Rite image not attached, placement could not be confirmed due to current cardiac rhythm.  ECHOCARDIOGRAM COMPLETE  Result Date:  09/15/2023    ECHOCARDIOGRAM REPORT   Patient Name:   Tim Day Date of Exam: 09/15/2023 Medical Rec #:  [id number]            Height:       70.0 in Accession #:    [id number]           Weight:       175.3 lb Date of Birth:  [birth year]            BSA:          1.973 m Patient Age:    72 years             BP:           104/87 mmHg Patient Gender: M                    HR:           75 bpm. [facility] Procedure: 2D Echo, Cardiac Doppler, Color Doppler and Intracardiac            Opacification Agent REPORT CONTAINS CRITICAL RESULT Indications:    I50.40* Unspecified combined systolic (congestive) and diastolic                 (congestive) heart failure  History:        Patient has prior history of Echocardiogram examinations, most                 recent 09/19/2022. CHF, Pulmonary HTN, Signs/Symptoms:Dyspnea,                 Shortness of Breath and Edema; Risk Factors:Dyslipidemia,                 Diabetes and Hypertension. Cardiac shock. Pneumothorax (left).  Sonographer:    Sheralyn Boatman RDCS Referring Phys: [id number] ASIA B ZIERLE-GHOSH  Sonographer Comments: Technically difficult study due to poor echo windows. IMPRESSIONS  1. Left ventricular ejection fraction, by estimation, is 50%. The left ventricle has low normal function. Left ventricular endocardial border not optimally defined to evaluate regional wall motion. Left ventricular diastolic parameters are indeterminate. There is the interventricular septum is flattened in systole and diastole, consistent with right ventricular pressure and volume overload.  2. Right ventricular systolic function is severely reduced. The right ventricular size is severely enlarged. There is severely elevated pulmonary artery systolic pressure.  3. The mitral valve is normal in structure. No  located on series 15, image 33, consistent with a bronchocele. No evidence of air trapping. Upper Abdomen: No acute abnormality. Musculoskeletal: No chest wall mass or suspicious bone lesions identified. IMPRESSION: 1. New mild patchy consolidations of the right lower lobe, likely due to infection or aspiration. Recommend follow-up chest CT in 3 months to ensure resolution. 2. Debris of the trachea and right mainstem bronchus, consistent with aspiration. 3. 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 Findings are suggestive of an alternative diagnosis (not UIP) per consensus guidelines: Diagnosis of Idiopathic Pulmonary Fibrosis: An Official ATS/ERS/JRS/ALAT Clinical Practice Guideline. Am Rosezetta Schlatter Crit Care Med Vol 198, Iss 5, [phone], Aug 02 2017. 4. Coronary artery calcifications and aortic Atherosclerosis (ICD10-I70.0). Electronically Signed   By: Allegra Lai M.D.   On: 09/21/2023 17:04   [place] EKG SITE RITE  Result Date: 09/16/2023 If Site Rite image not attached, placement could not be confirmed due to current cardiac rhythm.  ECHOCARDIOGRAM COMPLETE  Result Date:  09/15/2023    ECHOCARDIOGRAM REPORT   Patient Name:   Tim Day Date of Exam: 09/15/2023 Medical Rec #:  [id number]            Height:       70.0 in Accession #:    [id number]           Weight:       175.3 lb Date of Birth:  [birth year]            BSA:          1.973 m Patient Age:    72 years             BP:           104/87 mmHg Patient Gender: M                    HR:           75 bpm. [facility] Procedure: 2D Echo, Cardiac Doppler, Color Doppler and Intracardiac            Opacification Agent REPORT CONTAINS CRITICAL RESULT Indications:    I50.40* Unspecified combined systolic (congestive) and diastolic                 (congestive) heart failure  History:        Patient has prior history of Echocardiogram examinations, most                 recent 09/19/2022. CHF, Pulmonary HTN, Signs/Symptoms:Dyspnea,                 Shortness of Breath and Edema; Risk Factors:Dyslipidemia,                 Diabetes and Hypertension. Cardiac shock. Pneumothorax (left).  Sonographer:    Sheralyn Boatman RDCS Referring Phys: [id number] ASIA B ZIERLE-GHOSH  Sonographer Comments: Technically difficult study due to poor echo windows. IMPRESSIONS  1. Left ventricular ejection fraction, by estimation, is 50%. The left ventricle has low normal function. Left ventricular endocardial border not optimally defined to evaluate regional wall motion. Left ventricular diastolic parameters are indeterminate. There is the interventricular septum is flattened in systole and diastole, consistent with right ventricular pressure and volume overload.  2. Right ventricular systolic function is severely reduced. The right ventricular size is severely enlarged. There is severely elevated pulmonary artery systolic pressure.  3. The mitral valve is normal in structure. No

## 2023-09-25 NOTE — TOC Transition Note (Addendum)
Transition of Care [facility]) - CM/SW Discharge Note   Patient Details  Name: Tim Day MRN: [id number] Date of Birth: March 16, 1951  [facility]) CM/SW Contact:  Leone Haven, RN Phone Number: 09/25/2023, 12:13 PM   Clinical Narrative:    Patsy Lager will need to switch out the concentrator to a 10 liter concentrator before patient is dc today, so when he gets home he can just be switched to the concentrator,  [employer] has rep on the way to patient's home and daughter is there to receive it.  Once delivered he will be discharged.  Wife is here at the bedside she has two full tanks with her to get him home.   1513- per daughter concentrator is there at the house now, patient is good to be dc. NCM informed the staff RN.     Barriers to Discharge: Continued Medical Work up   Patient Goals and CMS Choice CMS Medicare.gov Compare Post Acute Care list provided to:: Patient Choice offered to / list presented to : Patient  Discharge Placement                         Discharge Plan and Services Additional resources added to the After Visit Summary for   In-house Referral: Clinical Social Work Discharge Planning Services: CM Consult Post Acute Care Choice: Home Health            DME Agency: [facility] Date DME Agency Contacted: 09/24/23 Time DME Agency Contacted: [phone] Representative spoke with at DME Agency: Morrie Sheldon HH Arranged: RN, PT [facility] Agency: [facility] Date [facility] Agency Contacted: 09/24/23 Time HH Agency Contacted: [phone] Representative spoke with at [facility] Agency: Kandee Keen  Social Determinants of Health (SDOH) Interventions SDOH Screenings   Food Insecurity: No Food Insecurity (09/15/2023)  Housing: Low Risk  (09/15/2023)  Transportation Needs: No Transportation Needs (09/15/2023)  Utilities: Not At Risk (09/15/2023)  Depression (PHQ2-9): Medium Risk (08/25/2023)  Tobacco Use: Low Risk  (09/15/2023)     Readmission Risk Interventions     09/15/2023   12:03 PM  Readmission Risk Prevention Plan  Transportation Screening Complete  Home Care Screening Complete  Medication Review (RN CM) Complete

## 2023-09-25 NOTE — Progress Notes (Signed)
   09/25/23 1129  Spiritual Encounters  Type of Visit Follow up  Care provided to: Pt and family  Conversation partners present during encounter Nurse  Reason for visit Routine spiritual support   Chaplain returned to follow up with Pt and family.  Pt feeling much better today and encouraged to hear discharge is likely today..  Both Pt and wife are comfortable with discharge plan and HHC plan.  Wife is full time care-giver and will appreciate the help. Pt possesses a health Christian faith and uses it as his support and help.  Pt also appears to have strong family relationships at home that encourage and support him as well. No further chaplain services needed at this time.  Chaplain services remain available by Spiritual Consult or for emergent cases, paging [phone]  Chaplain Raelene Bott, MDiv [email or web address] [phone]

## 2023-09-25 NOTE — Progress Notes (Signed)
Physical Therapy Treatment Patient Details Name: Tim Day MRN: [id number] DOB: [birth year] Today's Date: 09/25/2023   History of Present Illness 72 y/o male presented to [facility] 09/14/23 with SOB, edema, and overall decline. Admitted with acute on chronic CHF with stage 2 pressure injury on L buttocks. Pt in ICU from 10/13-10/15. Transferred to [facility] on 10/16 with palliative care following. PMH: chronic hypoxia (O2 dependent), DM, HTN, HLD, hx of COVID, COPD, DVT    PT Comments  Pt showed ability to progress further toward goals with O2 at 10L for activity.  Emphasis on safety in general and gait stability/stamina with RW and 10 L [place]. 09/25/2023      If plan is discharge home, recommend the following: A little help with walking and/or transfers;A little help with bathing/dressing/bathroom;Help with stairs or ramp for entrance;Assistance with cooking/housework   Can travel by private vehicle        Equipment Recommendations  None recommended by PT    Recommendations for Other Services       Precautions / Restrictions Precautions Precautions: Fall     Mobility  Bed Mobility Overal bed mobility: Needs Assistance Bed Mobility: Supine to Sit           General bed mobility comments: returned to sitting EOB.  Assisted up and forward due to pt struggled too much from lowered HOB.  Pt able to scoot to EOB well without assist.    Transfers Overall transfer level: Needs assistance     Sit to Stand: Contact guard assist           General transfer comment: use hand from the mattress appropriately.  CG only    Ambulation/Gait Ambulation/Gait assistance: Min assist Gait Distance (Feet): 90 Feet Assistive device: Rolling walker (2 wheels) Gait Pattern/deviations: Trunk flexed, Step-through pattern, Decreased stride length   Gait velocity interpretation: <1.8 ft/sec, indicate of risk for recurrent falls   General Gait Details: Generally steady in the RW.   Prior O2 authorization showed need for 10 L during gait.  With 10 L [place] sats  never dropped below 90%.  HR in the 100's to upper 110's bpm   Stairs             Wheelchair Mobility     Tilt Bed    Modified Rankin (Stroke Patients Only)       Balance     Sitting balance-Leahy Scale: Fair       Standing balance-Leahy Scale: Poor (to fair) Standing balance comment: reliant on RW when fatigued.                            Cognition Arousal: Alert Behavior During Therapy: WFL for tasks assessed/performed Overall Cognitive Status: Within Functional Limits for tasks assessed                                          Exercises      General Comments        Pertinent Vitals/Pain Pain Assessment Pain Assessment: Faces Faces Pain Scale: No hurt Pain Intervention(s): Monitored during session    Home Living                          Prior Function            PT Goals (current  goals can now be found in the care plan section) Acute Rehab PT Goals PT Goal Formulation: With patient/family Time For Goal Achievement: 10/01/23 Potential to Achieve Goals: Good Progress towards PT goals: Progressing toward goals (continue with same goals)    Frequency    Min 1X/week      PT Plan      Co-evaluation              AM-PAC PT "6 Clicks" Mobility   Outcome Measure  Help needed turning from your back to your side while in a flat bed without using bedrails?: A Little Help needed moving from lying on your back to sitting on the side of a flat bed without using bedrails?: A Little Help needed moving to and from a bed to a chair (including a wheelchair)?: A Little Help needed standing up from a chair using your arms (e.g., wheelchair or bedside chair)?: A Little Help needed to walk in hospital room?: A Little Help needed climbing 3-5 steps with a railing? : A Lot 6 Click Score: 17    End of Session Equipment Utilized During  Treatment: Oxygen Activity Tolerance: Patient limited by fatigue;Patient tolerated treatment well Patient left: in bed;with call bell/phone within reach;with bed alarm set;with family/visitor present Nurse Communication: Mobility status PT Visit Diagnosis: Unsteadiness on feet (R26.81);Difficulty in walking, not elsewhere classified (R26.2);Other abnormalities of gait and mobility (R26.89)     Time: 1410-1430 PT Time Calculation (min) (ACUTE ONLY): 20 min  Charges:    $Gait Training: 8-22 mins PT General Charges $$ ACUTE PT VISIT: 1 Visit                     09/25/2023  Jacinto Halim., PT Acute Rehabilitation Services [phone]  (office)   Eliseo Gum Janny Crute 09/25/2023, 3:08 PM

## 2023-09-25 NOTE — TOC Progression Note (Signed)
Transition of Care [facility]) - Progression Note    Patient Details  Name: Tim Day MRN: [id number] Date of Birth: [birth year]  [facility]) CM/SW Contact  Leone Haven, RN Phone Number: 09/25/2023, 11:25 AM  Clinical Narrative:    Patient is set up with [facility] for [facility], HHPT, HHOT.  NCM notified Kandee Keen with [employer] of dc today, also NCM spoke with Ashlin with [employer], he states he needs a ambulatory sats to change concentrator to a 10 liter one, so that he can have documentation supporting that.  Staff RN will check ambulatory saturations.    Expected Discharge Plan: Home w Home Health Services Barriers to Discharge: Continued Medical Work up  Expected Discharge Plan and Services In-house Referral: Clinical Social Work Discharge Planning Services: CM Consult Post Acute Care Choice: Home Health Living arrangements for the past 2 months: Single Family Home Expected Discharge Date: 09/25/23                 DME Agency: [facility] Date DME Agency Contacted: 09/24/23 Time DME Agency Contacted: [phone] Representative spoke with at DME Agency: Morrie Sheldon HH Arranged: RN, PT [facility] Agency: [facility] Date [facility] Agency Contacted: 09/24/23 Time HH Agency Contacted: [phone] Representative spoke with at [facility] Agency: Kandee Keen   Social Determinants of Health (SDOH) Interventions SDOH Screenings   Food Insecurity: No Food Insecurity (09/15/2023)  Housing: Low Risk  (09/15/2023)  Transportation Needs: No Transportation Needs (09/15/2023)  Utilities: Not At Risk (09/15/2023)  Depression (PHQ2-9): Medium Risk (08/25/2023)  Tobacco Use: Low Risk  (09/15/2023)    Readmission Risk Interventions    09/15/2023   12:03 PM  Readmission Risk Prevention Plan  Transportation Screening Complete  Home Care Screening Complete  Medication Review (RN CM) Complete

## 2023-09-25 NOTE — Procedures (Addendum)
Modified Barium Swallow Study  Patient Details  Name: Tim Day MRN: [id number] Date of Birth: 11-01-1951  Today's Date: 09/25/2023  Modified Barium Swallow completed.  Full report located under Chart Review in the Imaging Section.  History of Present Illness Patient is a 72 y.o. 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  He has had an overall decline since having Covid-19 in [place] resulting in a 72 day hospital admission. Portable CXR with cardiomegaly, low lung volumes, mild to moderate bibasilar atelectasis and possible infiltrate. He was having improving oxygenation on 10/20 but on 10/21 he had worsening dyspnea with possible PNA. 10/21 chest radiograph with hypoinflation and bilateral basal interstitial infiltrates. SLP evaluation of swallow ordered on 10/23.   Clinical Impression Patient seen by SLP for modified barium swallow study. Patient was alert and agreeable to this evaluation. 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 No instances of aspiration or penetration were observed over the course of this study. Diffuse pharyngeal residuals were observed, but did not result in post prandial aspiration and cleared after multiple swallows. Incomplete epiglottic inversion was noted, however, adequate airway protection was achieved. Pharyngeal edema appeared to result in pharyngeal wall narrowing. Swallow initiation was delayed to the pyriform sinuses with thin liquid administration and to the vallecula with all other consistencies. SLP recommending patient continue with regular diet and thin liquids at this time. Given his risk of post prandial aspiration, patient's wife was educated on aspiration precautions during and after meals  (e.g., remain upright 30 minutes after a meal). No further ST is indicated. Factors that may increase risk of adverse event in presence of aspiration Tim Day & Clearance Tim Day): Poor general health and/or compromised immunity  Swallow Evaluation Recommendations Recommendations: PO diet PO Diet Recommendation: Regular;Thin liquids (Level 0) Liquid Administration via: Cup;Straw;Spoon Medication Administration: Whole meds with puree (As needed) Supervision: Patient able to self-feed;Set-up assistance for safety Swallowing strategies  : Small bites/sips;Slow rate;Minimize environmental distractions Postural changes: Position pt fully upright for meals;Stay upright 30-60 min after meals Oral care recommendations: Oral care BID (2x/day)     Tim Day, B.S., Speech Therapy Student   09/25/2023,10:25 AM

## 2023-09-25 NOTE — Progress Notes (Signed)
Patient ID: Tim Day, male   DOB: 22-Jan-1951, 72 y.o.   MRN: [id number]     Advanced Heart Failure Rounding Note  PCP-Cardiologist: Rollene Rotunda, MD   Subjective:    Aspirated 10/22 required bipap. Diuretics held.   Sitting in chair, wife at bedside. Feels around baseline and feels like he's getting stronger.   Objective:   Weight Range: 78.5 kg Body mass index is 24.83 kg/m.   Vital Signs:   Temp:  [97.1 F (36.2 C)-98.3 F (36.8 C)] 97.4 F (36.3 C) (10/24 0823) Pulse Rate:  [63-86] 79 (10/24 0823) Resp:  [20-33] 24 (10/24 0823) BP: (93-116)/(66-87) 103/71 (10/24 0823) SpO2:  [91 %-100 %] 100 % (10/24 0123) FiO2 (%):  [40 %] 40 % (10/23 2255) Last BM Date : 09/23/23  Weight change: Filed Weights   09/22/23 0441 09/23/23 0344 09/24/23 0500  Weight: 79.9 kg 78.1 kg 78.5 kg    Intake/Output:   Intake/Output Summary (Last 24 hours) at 09/25/2023 0854 Last data filed at 09/25/2023 0130 Gross per 24 hour  Intake 440 ml  Output 1400 ml  Net -960 ml     CVP 11 Physical Exam  General:  elderly. frail appearing.  No respiratory difficulty HEENT: normal Neck: supple. JVD ~10 cm. Carotids 2+ bilat; no bruits. No lymphadenopathy or thyromegaly appreciated. Cor: PMI nondisplaced. Regular rate & rhythm. No rubs, gallops or murmurs. Lungs: clear, diminished bases Abdomen: soft, nontender, nondistended. No hepatosplenomegaly. No bruits or masses. Good bowel sounds. Extremities: no cyanosis, clubbing, rash, edema. PICC RUE  Neuro: alert & oriented x 3, cranial nerves grossly intact. moves all 4 extremities w/o difficulty. Affect pleasant.   Telemetry   NSR 70s (Personally reviewed)    Labs    CBC Recent Labs    09/23/23 0505  WBC 14.9*  HGB 17.2*  HCT 55.9*  MCV 91.3  PLT 203   Basic Metabolic Panel Recent Labs    23/76/28 0517 09/25/23 0410  NA 134* 137  K 3.6 3.9  CL 88* 92*  CO2 36* 35*  GLUCOSE 132* 164*  BUN 53* 45*  CREATININE  1.20 1.04  CALCIUM 8.3* 8.6*  MG 2.5* 2.5*   Liver Function Tests No results for input(s): "AST", "ALT", "ALKPHOS", "BILITOT", "PROT", "ALBUMIN" in the last 72 hours.  No results for input(s): "LIPASE", "AMYLASE" in the last 72 hours. Cardiac Enzymes No results for input(s): "CKTOTAL", "CKMB", "CKMBINDEX", "TROPONINI" in the last 72 hours.  BNP: BNP (last 3 results) Recent Labs    09/14/23 1942  BNP 668.0*    ProBNP (last 3 results) No results for input(s): "PROBNP" in the last 8760 hours.   D-Dimer No results for input(s): "DDIMER" in the last 72 hours. Hemoglobin A1C No results for input(s): "HGBA1C" in the last 72 hours. Fasting Lipid Panel No results for input(s): "CHOL", "HDL", "LDLCALC", "TRIG", "CHOLHDL", "LDLDIRECT" in the last 72 hours. Thyroid Function Tests No results for input(s): "TSH", "T4TOTAL", "T3FREE", "THYROIDAB" in the last 72 hours.  Invalid input(s): "FREET3"  Other results:   Imaging    No results found.   Medications:     Scheduled Medications:  apixaban  5 mg Oral BID   buPROPion  150 mg Oral Daily   Chlorhexidine Gluconate Cloth  6 each Topical Daily   dapagliflozin propanediol  10 mg Oral Daily   famotidine  20 mg Oral Daily   fluticasone furoate-vilanterol  1 puff Inhalation Daily   insulin aspart  0-9 Units Subcutaneous TID WC  insulin glargine-yfgn  8 Units Subcutaneous Daily   nystatin  5 mL Oral QID   potassium chloride  40 mEq Oral Daily   rosuvastatin  5 mg Oral QHS   sertraline  100 mg Oral Daily   sodium chloride flush  10-40 mL Intracatheter Q12H   spironolactone  12.5 mg Oral Daily   tamsulosin  0.4 mg Oral Daily    Infusions:  cefTRIAXone (ROCEPHIN)  IV Stopped (09/24/23 1257)    PRN Medications: acetaminophen **OR** acetaminophen, ALPRAZolam, ipratropium-albuterol, oxyCODONE, polyvinyl alcohol, sodium chloride flush  Patient Profile  Mr Morioka is a 72 year old with a history of  PAH, HTN, DVT,  chronic respiratory failure on home oxygen, post COVID pulmonary fibrosis, GERD, DMII, DVT, and diastolic heart failure.    Admitted with A/C HFpEF .  Now with severely reduced RV. Transfer to [facility] for Advanced Heart Failure Team consultation/management, attempt at Palliative diuresis.  Assessment/Plan  1. A/C HFpEF-->Cor Pulmonale  - Echo 2023 RV ok but now severely reduced.  - Echo this admit  LVEF preserved and RV severely reduced - P/w NYHA Class IV symptoms and marked volume overload  -Mixed venous sat remains stable off Dobutamine. Discontinue CO-OX  - Appears euvolemic. Overall diuresed 22 pounds. CVP 11, ok for slightly higher CVP with bad RV. Start torsemide 20 mg daily. - No room for titration with soft SBP.  Renal function stable.  - Continue spironolactone 12.5 daily  - Continue farxiga 10 mg daily.  - Continue unna boots    2. Acute Hypoxic Respiratory Failure - Oxygen requirements increased on admit 13 liters-->weaned to 5 liters.  - At home he is on 5 liter [place].  - Improved with diuresis   3.  Pulmonary HTN with cor pulmonale - Has history of ILD on chronic oxygen + OSA. Chronic respiratory failure on 5 liters oxygen.  - Suspect WHO Group III but needs additional work up => high resolution CT 10/20: Fibrotic interstitial lung disease  - Continue Supp O2    4. AKI -Creatinine stable at 1.2  -Follow BMET    5. H/O DVT - US DVT RLE 2023  - On eliquis chronically, 5 mg bid  - No bleeding.   6. PNA vs aspiration  - reports "choking up" on KDUR/pudding mix 10/20 - CT with patchy consolidations, PNA vs aspiration. Also with debris in the trachea consistent with aspiration.  - Completing ceftriaxone per primary - Sats stable on home O2 now.   7. GOC  - DNR/ DNI  - Palliative Care following.   Stable for discharge from AHF perspective whenever ok per primary. Has f/u in AHF clinic.   AHF meds at discharge:  Eliquis 5 mg BID Farxiga 10 mg daily KDUR 40 mEq  daily Rosuvastatin 5 mg QHS Spironolactone 12.5 mg daily Torsemide 20 mg daily  Length of Stay: 11  Alen Bleacher, NP  09/25/2023, 8:54 AM  Advanced Heart Failure Team Pager [phone] (M-F; 7a - 5p)  Please contact [facility] for night-coverage after hours (5p -7a ) and weekends on [email or web address]

## 2023-09-26 DIAGNOSIS — J8410 Pulmonary fibrosis, unspecified: Secondary | ICD-10-CM | POA: Diagnosis not present

## 2023-09-26 DIAGNOSIS — I5040 Unspecified combined systolic (congestive) and diastolic (congestive) heart failure: Secondary | ICD-10-CM | POA: Diagnosis not present

## 2023-09-26 DIAGNOSIS — J9611 Chronic respiratory failure with hypoxia: Secondary | ICD-10-CM | POA: Diagnosis not present

## 2023-09-27 DIAGNOSIS — J449 Chronic obstructive pulmonary disease, unspecified: Secondary | ICD-10-CM | POA: Diagnosis not present

## 2023-09-27 DIAGNOSIS — Z86718 Personal history of other venous thrombosis and embolism: Secondary | ICD-10-CM | POA: Diagnosis not present

## 2023-09-27 DIAGNOSIS — Z7985 Long-term (current) use of injectable non-insulin antidiabetic drugs: Secondary | ICD-10-CM | POA: Diagnosis not present

## 2023-09-27 DIAGNOSIS — Z85828 Personal history of other malignant neoplasm of skin: Secondary | ICD-10-CM | POA: Diagnosis not present

## 2023-09-27 DIAGNOSIS — F32A Depression, unspecified: Secondary | ICD-10-CM | POA: Diagnosis not present

## 2023-09-27 DIAGNOSIS — J9811 Atelectasis: Secondary | ICD-10-CM | POA: Diagnosis not present

## 2023-09-27 DIAGNOSIS — Z7984 Long term (current) use of oral hypoglycemic drugs: Secondary | ICD-10-CM | POA: Diagnosis not present

## 2023-09-27 DIAGNOSIS — I272 Pulmonary hypertension, unspecified: Secondary | ICD-10-CM | POA: Diagnosis not present

## 2023-09-27 DIAGNOSIS — R1313 Dysphagia, pharyngeal phase: Secondary | ICD-10-CM | POA: Diagnosis not present

## 2023-09-27 DIAGNOSIS — E1169 Type 2 diabetes mellitus with other specified complication: Secondary | ICD-10-CM | POA: Diagnosis not present

## 2023-09-27 DIAGNOSIS — F5101 Primary insomnia: Secondary | ICD-10-CM | POA: Diagnosis not present

## 2023-09-27 DIAGNOSIS — Z794 Long term (current) use of insulin: Secondary | ICD-10-CM | POA: Diagnosis not present

## 2023-09-27 DIAGNOSIS — Z7901 Long term (current) use of anticoagulants: Secondary | ICD-10-CM | POA: Diagnosis not present

## 2023-09-27 DIAGNOSIS — M7918 Myalgia, other site: Secondary | ICD-10-CM | POA: Diagnosis not present

## 2023-09-27 DIAGNOSIS — I110 Hypertensive heart disease with heart failure: Secondary | ICD-10-CM | POA: Diagnosis not present

## 2023-09-27 DIAGNOSIS — J9621 Acute and chronic respiratory failure with hypoxia: Secondary | ICD-10-CM | POA: Diagnosis not present

## 2023-09-27 DIAGNOSIS — I5032 Chronic diastolic (congestive) heart failure: Secondary | ICD-10-CM | POA: Diagnosis not present

## 2023-09-27 DIAGNOSIS — F419 Anxiety disorder, unspecified: Secondary | ICD-10-CM | POA: Diagnosis not present

## 2023-09-27 DIAGNOSIS — J690 Pneumonitis due to inhalation of food and vomit: Secondary | ICD-10-CM | POA: Diagnosis not present

## 2023-09-27 DIAGNOSIS — Z8616 Personal history of COVID-19: Secondary | ICD-10-CM | POA: Diagnosis not present

## 2023-09-27 DIAGNOSIS — E782 Mixed hyperlipidemia: Secondary | ICD-10-CM | POA: Diagnosis not present

## 2023-09-27 DIAGNOSIS — N179 Acute kidney failure, unspecified: Secondary | ICD-10-CM | POA: Diagnosis not present

## 2023-09-27 DIAGNOSIS — K219 Gastro-esophageal reflux disease without esophagitis: Secondary | ICD-10-CM | POA: Diagnosis not present

## 2023-09-27 DIAGNOSIS — Z7951 Long term (current) use of inhaled steroids: Secondary | ICD-10-CM | POA: Diagnosis not present

## 2023-09-30 ENCOUNTER — Telehealth (HOSPITAL_COMMUNITY): Payer: Self-pay | Admitting: Cardiology

## 2023-09-30 NOTE — Telephone Encounter (Signed)
HHRN called to report 4lb weight gain over the weekend D/c weight 167 weight today 171 No SOB 2+ edema LE (stable) Sats 96% on 8L Compliant with meds  Please call with additional changes if needed HFU scheduled 8/31 [facility]

## 2023-10-01 NOTE — Progress Notes (Signed)
Advanced Heart Failure Clinic Note   Referring Physician: PCP: Benita Stabile, MD PCP-Cardiologist: Rollene Rotunda, MD  [facility]: Dr. Gala Romney   HPI:  72 year old with a history of  PAH, HTN, DVT, chronic respiratory failure on home oxygen, post COVID pulmonary fibrosis, GERD, DMII, DVT, and diastolic heart failure.    Admitted to [facility] 10/24 w/ A/C HFpEF, echo showed severely reduced RV, Cor Pulmonale  Transfer to [facility] for Advanced Heart Failure Team consultation/management, attempt at Palliative diuresis. Required DBA to help w/ diuresis. Diuresed 22 lb and transitioned to PO torsemide. DBA weaned off w/ stable co-ox.      Review of Systems: [y] = yes, [ ]  = no   General: Weight gain [ ] ; Weight loss [ ] ; Anorexia [ ] ; Fatigue [ ] ; Fever [ ] ; Chills [ ] ; Weakness [ ]   Cardiac: Chest pain/pressure [ ] ; Resting SOB [ ] ; Exertional SOB [ ] ; Orthopnea [ ] ; Pedal Edema [ ] ; Palpitations [ ] ; Syncope [ ] ; Presyncope [ ] ; Paroxysmal nocturnal dyspnea[ ]   Pulmonary: Cough [ ] ; Wheezing[ ] ; Hemoptysis[ ] ; Sputum [ ] ; Snoring [ ]   GI: Vomiting[ ] ; Dysphagia[ ] ; Melena[ ] ; Hematochezia [ ] ; Heartburn[ ] ; Abdominal pain [ ] ; Constipation [ ] ; Diarrhea [ ] ; BRBPR [ ]   GU: Hematuria[ ] ; Dysuria [ ] ; Nocturia[ ]   Vascular: Pain in legs with walking [ ] ; Pain in feet with lying flat [ ] ; Non-healing sores [ ] ; Stroke [ ] ; TIA [ ] ; Slurred speech [ ] ;  Neuro: Headaches[ ] ; Vertigo[ ] ; Seizures[ ] ; Paresthesias[ ] ;Blurred vision [ ] ; Diplopia [ ] ; Vision changes [ ]   Ortho/Skin: Arthritis [ ] ; Joint pain [ ] ; Muscle pain [ ] ; Joint swelling [ ] ; Back Pain [ ] ; Rash [ ]   Psych: Depression[ ] ; Anxiety[ ]   Heme: Bleeding problems [ ] ; Clotting disorders [ ] ; Anemia [ ]   Endocrine: Diabetes [ ] ; Thyroid dysfunction[ ]    Past Medical History:  Diagnosis Date   Acute respiratory disease    Atypical mole 12/30/2012   severe left post shoulder tx exc   Candidiasis of urogenital sites    Diabetes  mellitus    Diffuse myofascial pain syndrome    Esophageal reflux    Hypertension    Impacted cerumen of right ear    Melanoma (HCC) 06/14/2011   left ear mohs   Mixed hyperlipidemia    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     albuterol (VENTOLIN HFA) 108 (90 Base) MCG/ACT inhaler Inhale 2 puffs into the lungs every 6 (six) hours as needed for wheezing or shortness of breath. 1 each 5   alclomethasone (ACLOVATE) 0.05 % cream Apply topically 2 (two) times daily as needed (Rash). 180 g 3   ALPRAZolam (XANAX) 0.25 MG tablet Take 0.25 mg by mouth daily as needed for anxiety.     apixaban (ELIQUIS) 5 MG TABS tablet Take 1 tablet (5 mg total) by mouth 2 (two) times daily. 60 tablet 3   ascorbic  acid (VITAMIN C) 500 MG tablet Take 1 tablet (500 mg total) by mouth daily. 30 tablet 0   blood glucose meter kit and supplies KIT Dispense based on patient and insurance preference. Use up to four times daily as directed. (FOR ICD-9 250.00, 250.01). 1 each 0   buPROPion (WELLBUTRIN XL) 150 MG 24 hr tablet Take 150 mg by mouth in the morning.     Cholecalciferol (VITAMIN D3) 125 MCG (5000 UT) CAPS Take 5,000 Units by mouth in the morning.     Chromium-Cinnamon (CINNAMON PLUS CHROMIUM PO) Take 1 tablet by mouth in the morning.     CO-ENZYME Q-10 PO Take 100 mg by mouth in the morning.     famotidine (PEPCID) 20 MG tablet TAKE ONE TABLET BY MOUTH DAILY AFTER SUPPER (Patient taking differently: Take 20 mg by mouth daily. After supper) 30 tablet 5   fluticasone  furoate-vilanterol (BREO ELLIPTA) 100-25 MCG/ACT AEPB Inhale 1 puff into the lungs daily. 60 each 11   insulin aspart (NOVOLOG FLEXPEN) 100 UNIT/ML FlexPen Inject 2-18 Units into the skin daily after supper. Sliding scale     Insulin Pen Needle (PEN NEEDLES) 32G X 4 MM MISC 1 Package by Does not apply route 4 (four) times daily -  before meals and at bedtime. 1 each 0   ipratropium (ATROVENT) 0.06 % nasal spray Place 2 sprays into both nostrils 4 (four) times daily. (Patient taking differently: Place 2 sprays into both nostrils 4 (four) times daily as needed (allergies.).) 15 mL 12   ipratropium-albuterol (DUONEB) 0.5-2.5 (3) MG/3ML SOLN Take 3 mLs by nebulization every 6 (six) hours as needed. 360 mL 3   Lancets (ONETOUCH DELICA PLUS LANCET33G) MISC Apply topically 2 (two) times daily.     Multiple Vitamin (MULTIVITAMIN WITH MINERALS) TABS tablet Take 1 tablet by mouth every evening.     pantoprazole (PROTONIX) 40 MG tablet Take 40 mg by mouth at bedtime.     potassium chloride SA (KLOR-CON M) 20 MEQ tablet Take 2 tablets (40 mEq total) by mouth daily. 60 tablet 0   rosuvastatin (CRESTOR) 5 MG tablet Take 5 mg by mouth at bedtime.     sertraline (ZOLOFT) 100 MG tablet Take 100 mg by mouth in the morning.     spironolactone (ALDACTONE) 25 MG tablet Take 0.5 tablets (12.5 mg total) by mouth daily. 30 tablet 0   SYNJARDY XR 12.04-999 MG TB24 Take 1 tablet by mouth 2 (two) times daily.     tamsulosin (FLOMAX) 0.4 MG CAPS capsule Take 0.4 mg by mouth 2 (two) times daily.     torsemide (DEMADEX) 20 MG tablet Take 1 tablet (20 mg total) by mouth daily. 30 tablet 0   TOUJEO MAX SOLOSTAR 300 UNIT/ML Solostar Pen Inject 10 Units into the skin in the morning.     zinc sulfate 220 (50 Zn) MG capsule Take 1 capsule (220 mg total) by mouth daily. (Patient taking differently: Take 220 mg by mouth every evening.) 30 capsule 0   No current facility-administered medications for this visit.    No Known  Allergies    Social History   Socioeconomic History   Marital status: Married    Spouse name: Not on file   Number of children: Not on file   Years of education: Not on file   Highest education level: Not on file  Occupational History   Not on file  Tobacco Use   Smoking status: Never   Smokeless tobacco: Never  Vaping  Use   Vaping status: Never Used  Substance and Sexual Activity   Alcohol use: No   Drug use: No   Sexual activity: Never    Birth control/protection: None  Other Topics Concern   Not on file  Social History Narrative   Right handed   Drinks caffeine   One story home   Social Determinants of Health   Financial Resource Strain: Not on file  Food Insecurity: No Food Insecurity (09/15/2023)   Hunger Vital Sign    Worried About Running Out of Food in the Last Year: Never true    Ran Out of Food in the Last Year: Never true  Transportation Needs: No Transportation Needs (09/15/2023)   PRAPARE - Administrator, Civil Service (Medical): No    Lack of Transportation (Non-Medical): No  Physical Activity: Not on file  Stress: Not on file  Social Connections: Not on file  Intimate Partner Violence: Not At Risk (09/15/2023)   Humiliation, Afraid, Rape, and Kick questionnaire    Fear of Current or Ex-Partner: No    Emotionally Abused: No    Physically Abused: No    Sexually Abused: No      Family History  Problem Relation Age of Onset   CAD Father        CABG x 2 (1st in his 36's)   Diabetes Mother     There were no vitals filed for this visit.   PHYSICAL EXAM: General:  Well appearing. No respiratory difficulty HEENT: normal Neck: supple. no JVD. Carotids 2+ bilat; no bruits. No lymphadenopathy or thyromegaly appreciated. Cor: PMI nondisplaced. Regular rate & rhythm. No rubs, gallops or murmurs. Lungs: clear Abdomen: soft, nontender, nondistended. No hepatosplenomegaly. No bruits or masses. Good bowel sounds. Extremities: no cyanosis,  clubbing, rash, edema Neuro: alert & oriented x 3, cranial nerves grossly intact. moves all 4 extremities w/o difficulty. Affect pleasant.  ECG:   ASSESSMENT & PLAN:     Robbie Lis, PA-C 10/01/23

## 2023-10-01 NOTE — Telephone Encounter (Signed)
PT AWAARE

## 2023-10-02 ENCOUNTER — Encounter (HOSPITAL_COMMUNITY): Payer: Self-pay

## 2023-10-02 ENCOUNTER — Telehealth (HOSPITAL_COMMUNITY): Payer: Self-pay

## 2023-10-02 ENCOUNTER — Ambulatory Visit (HOSPITAL_COMMUNITY)
Admit: 2023-10-02 | Discharge: 2023-10-02 | Disposition: A | Discharge status: Home / Self Care | Payer: PPO | Attending: Cardiology | Admitting: Cardiology

## 2023-10-02 VITALS — BP 90/62 | HR 93 | Wt 173.6 lb

## 2023-10-02 DIAGNOSIS — Z79899 Other long term (current) drug therapy: Secondary | ICD-10-CM | POA: Diagnosis not present

## 2023-10-02 DIAGNOSIS — I2781 Cor pulmonale (chronic): Secondary | ICD-10-CM | POA: Insufficient documentation

## 2023-10-02 DIAGNOSIS — I110 Hypertensive heart disease with heart failure: Secondary | ICD-10-CM | POA: Diagnosis not present

## 2023-10-02 DIAGNOSIS — I2729 Other secondary pulmonary hypertension: Secondary | ICD-10-CM | POA: Diagnosis not present

## 2023-10-02 DIAGNOSIS — G4733 Obstructive sleep apnea (adult) (pediatric): Secondary | ICD-10-CM | POA: Diagnosis not present

## 2023-10-02 DIAGNOSIS — Z794 Long term (current) use of insulin: Secondary | ICD-10-CM | POA: Insufficient documentation

## 2023-10-02 DIAGNOSIS — J849 Interstitial pulmonary disease, unspecified: Secondary | ICD-10-CM | POA: Diagnosis not present

## 2023-10-02 DIAGNOSIS — Z7901 Long term (current) use of anticoagulants: Secondary | ICD-10-CM | POA: Diagnosis not present

## 2023-10-02 DIAGNOSIS — Z9981 Dependence on supplemental oxygen: Secondary | ICD-10-CM | POA: Diagnosis not present

## 2023-10-02 DIAGNOSIS — Z66 Do not resuscitate: Secondary | ICD-10-CM | POA: Insufficient documentation

## 2023-10-02 DIAGNOSIS — E119 Type 2 diabetes mellitus without complications: Secondary | ICD-10-CM | POA: Diagnosis not present

## 2023-10-02 DIAGNOSIS — I50812 Chronic right heart failure: Secondary | ICD-10-CM

## 2023-10-02 DIAGNOSIS — Z86718 Personal history of other venous thrombosis and embolism: Secondary | ICD-10-CM | POA: Insufficient documentation

## 2023-10-02 DIAGNOSIS — I5032 Chronic diastolic (congestive) heart failure: Secondary | ICD-10-CM | POA: Diagnosis not present

## 2023-10-02 DIAGNOSIS — Z7984 Long term (current) use of oral hypoglycemic drugs: Secondary | ICD-10-CM | POA: Insufficient documentation

## 2023-10-02 DIAGNOSIS — J9611 Chronic respiratory failure with hypoxia: Secondary | ICD-10-CM | POA: Diagnosis not present

## 2023-10-02 LAB — BASIC METABOLIC PANEL
Anion gap: 12 (ref 5–15)
BUN: 31 mg/dL — ABNORMAL HIGH (ref 8–23)
CO2: 26 mmol/L (ref 22–32)
Calcium: 8.6 mg/dL — ABNORMAL LOW (ref 8.9–10.3)
Chloride: 97 mmol/L — ABNORMAL LOW (ref 98–111)
Creatinine, Ser: 1.11 mg/dL (ref 0.61–1.24)
GFR, Estimated: >60 mL/min (ref >60)
Glucose, Bld: 156 mg/dL — ABNORMAL HIGH (ref 70–99)
Potassium: 4.5 mmol/L (ref 3.5–5.1)
Sodium: 135 mmol/L (ref 135–145)

## 2023-10-02 LAB — CBC
HCT: 48.4 % (ref 39.0–52.0)
Hemoglobin: 14.8 g/dL (ref 13.0–17.0)
MCH: 27.4 pg (ref 26.0–34.0)
MCHC: 30.6 g/dL (ref 30.0–36.0)
MCV: 89.5 fL (ref 80.0–100.0)
Platelets: 147 10*3/uL — ABNORMAL LOW (ref 150–400)
RBC: 5.41 MIL/uL (ref 4.22–5.81)
RDW: 16.5 % — ABNORMAL HIGH (ref 11.5–15.5)
WBC: 6 10*3/uL (ref 4.0–10.5)
nRBC: 0 % (ref 0.0–0.2)

## 2023-10-02 LAB — BRAIN NATRIURETIC PEPTIDE: B Natriuretic Peptide: 469.8 pg/mL — ABNORMAL HIGH (ref 0.0–100.0)

## 2023-10-02 MED ORDER — TORSEMIDE 20 MG PO TABS: 20.0000 mg | ORAL_TABLET | Freq: Two times a day (BID) | ORAL | 1 refills | Status: DC

## 2023-10-02 NOTE — Patient Instructions (Signed)
Medication Changes:  INCREASE TORSEMIDE TO 20MG  TWICE DAILY   Lab Work:  Labs done today, your results will be available in MyChart, we will contact you for abnormal readings.  Follow-Up in: 2 WEEKS WITH APP AS SCHEDULED   At the Advanced Heart Failure Clinic, you and your health needs are our priority. We have a designated team specialized in the treatment of Heart Failure. This Care Team includes your primary Heart Failure Specialized Cardiologist (physician), Advanced Practice Providers (APPs- Physician Assistants and Nurse Practitioners), and Pharmacist who all work together to provide you with the care you need, when you need it.   You may see any of the following providers on your designated Care Team at your next follow up:  Dr. Arvilla Meres Dr. Marca Ancona Dr. Dorthula Nettles Dr. Theresia Bough Tonye Becket, NP Robbie Lis, [place] [facility] [place], [place] Brynda Peon, NP [place] Lee, NP Karle Plumber, PharmD   Please be sure to bring in all your medications bottles to every appointment.   Need to Contact us:  If you have any questions or concerns before your next appointment please send [place] a message through [place] or call our office at [phone].    TO LEAVE A MESSAGE FOR THE NURSE SELECT OPTION 2, PLEASE LEAVE A MESSAGE INCLUDING: YOUR NAME DATE OF BIRTH CALL BACK NUMBER REASON FOR CALL**this is important as we prioritize the call backs  YOU WILL RECEIVE A CALL BACK THE SAME DAY AS LONG AS YOU CALL BEFORE 4:00 PM

## 2023-10-02 NOTE — Telephone Encounter (Signed)
Allayne Butcher, PA-C  P [facility] stable. Diuretics increased. Needs follow up BMP and BNP in 7 days at [facility]   Attempted to call patient, left message for patient to call back to office.

## 2023-10-03 ENCOUNTER — Telehealth (HOSPITAL_COMMUNITY): Payer: Self-pay

## 2023-10-03 DIAGNOSIS — I110 Hypertensive heart disease with heart failure: Secondary | ICD-10-CM | POA: Diagnosis not present

## 2023-10-03 DIAGNOSIS — Z713 Dietary counseling and surveillance: Secondary | ICD-10-CM | POA: Diagnosis not present

## 2023-10-03 DIAGNOSIS — B370 Candidal stomatitis: Secondary | ICD-10-CM | POA: Diagnosis not present

## 2023-10-03 DIAGNOSIS — B001 Herpesviral vesicular dermatitis: Secondary | ICD-10-CM | POA: Diagnosis not present

## 2023-10-03 DIAGNOSIS — I5033 Acute on chronic diastolic (congestive) heart failure: Secondary | ICD-10-CM | POA: Diagnosis not present

## 2023-10-03 DIAGNOSIS — Z6824 Body mass index (BMI) 24.0-24.9, adult: Secondary | ICD-10-CM | POA: Diagnosis not present

## 2023-10-03 DIAGNOSIS — J690 Pneumonitis due to inhalation of food and vomit: Secondary | ICD-10-CM | POA: Diagnosis not present

## 2023-10-03 DIAGNOSIS — I5032 Chronic diastolic (congestive) heart failure: Secondary | ICD-10-CM

## 2023-10-03 DIAGNOSIS — J9601 Acute respiratory failure with hypoxia: Secondary | ICD-10-CM | POA: Diagnosis not present

## 2023-10-03 DIAGNOSIS — Z515 Encounter for palliative care: Secondary | ICD-10-CM | POA: Diagnosis not present

## 2023-10-03 NOTE — Telephone Encounter (Signed)
-----   Message from [place] sent at 10/02/2023  2:07 PM EDT ----- Labs stable. Diuretics increased. Needs follow up BMP and BNP in 7 days at [facility]

## 2023-10-03 NOTE — Telephone Encounter (Signed)
Patients wife advised and verbalized understanding. Lab orders entered.   Orders Placed This Encounter  Procedures   Basic metabolic panel    Standing Status:   Future    Standing Expiration Date:   10/02/2024    Order Specific Question:   Release to patient    Answer:   Immediate    Order Specific Question:   Release to patient    Answer:   Immediate [1]   B Nat Peptide    Standing Status:   Future    Standing Expiration Date:   10/02/2024    Order Specific Question:   Release to patient    Answer:   Immediate [1]

## 2023-10-08 ENCOUNTER — Telehealth: Payer: Self-pay | Admitting: Internal Medicine

## 2023-10-08 NOTE — Telephone Encounter (Signed)
Cancel CT but not the f/u ov

## 2023-10-08 NOTE — Telephone Encounter (Signed)
Patient is schedule for a High Resolution Chest CT on 10/17/23 at [facility].  He was hospitalized recently and had this testing done on 09/21/23.  Wanted to be sure it does not have to be repeated that soon before the appointment is cancelled.  [phone]

## 2023-10-08 NOTE — Telephone Encounter (Signed)
Please advise, thank you.

## 2023-10-09 ENCOUNTER — Other Ambulatory Visit: Payer: Self-pay

## 2023-10-09 ENCOUNTER — Other Ambulatory Visit (HOSPITAL_COMMUNITY)
Admission: RE | Admit: 2023-10-09 | Discharge: 2023-10-09 | Disposition: A | Discharge status: Home / Self Care | Payer: PPO | Source: Ambulatory Visit | Attending: Cardiology | Admitting: Cardiology

## 2023-10-09 ENCOUNTER — Inpatient Hospital Stay (HOSPITAL_COMMUNITY)
Admission: EM | Admit: 2023-10-09 | Discharge: 2023-11-02 | DRG: 177 | Disposition: E | Payer: PPO | Attending: Pulmonary Disease | Admitting: Pulmonary Disease

## 2023-10-09 ENCOUNTER — Emergency Department (HOSPITAL_COMMUNITY): Payer: PPO

## 2023-10-09 ENCOUNTER — Encounter (HOSPITAL_COMMUNITY): Payer: Self-pay | Admitting: Emergency Medicine

## 2023-10-09 DIAGNOSIS — R58 Hemorrhage, not elsewhere classified: Secondary | ICD-10-CM | POA: Diagnosis not present

## 2023-10-09 DIAGNOSIS — I5032 Chronic diastolic (congestive) heart failure: Secondary | ICD-10-CM | POA: Insufficient documentation

## 2023-10-09 DIAGNOSIS — E860 Dehydration: Secondary | ICD-10-CM | POA: Diagnosis present

## 2023-10-09 DIAGNOSIS — G4733 Obstructive sleep apnea (adult) (pediatric): Secondary | ICD-10-CM | POA: Diagnosis not present

## 2023-10-09 DIAGNOSIS — I825Y9 Chronic embolism and thrombosis of unspecified deep veins of unspecified proximal lower extremity: Secondary | ICD-10-CM | POA: Diagnosis present

## 2023-10-09 DIAGNOSIS — Z85828 Personal history of other malignant neoplasm of skin: Secondary | ICD-10-CM

## 2023-10-09 DIAGNOSIS — N281 Cyst of kidney, acquired: Secondary | ICD-10-CM | POA: Diagnosis not present

## 2023-10-09 DIAGNOSIS — J8410 Pulmonary fibrosis, unspecified: Secondary | ICD-10-CM | POA: Diagnosis not present

## 2023-10-09 DIAGNOSIS — R040 Epistaxis: Secondary | ICD-10-CM | POA: Diagnosis not present

## 2023-10-09 DIAGNOSIS — F419 Anxiety disorder, unspecified: Secondary | ICD-10-CM | POA: Diagnosis present

## 2023-10-09 DIAGNOSIS — K219 Gastro-esophageal reflux disease without esophagitis: Secondary | ICD-10-CM | POA: Diagnosis present

## 2023-10-09 DIAGNOSIS — I110 Hypertensive heart disease with heart failure: Secondary | ICD-10-CM | POA: Diagnosis not present

## 2023-10-09 DIAGNOSIS — R042 Hemoptysis: Secondary | ICD-10-CM | POA: Diagnosis not present

## 2023-10-09 DIAGNOSIS — F32A Depression, unspecified: Secondary | ICD-10-CM | POA: Diagnosis not present

## 2023-10-09 DIAGNOSIS — Z7984 Long term (current) use of oral hypoglycemic drugs: Secondary | ICD-10-CM

## 2023-10-09 DIAGNOSIS — D6832 Hemorrhagic disorder due to extrinsic circulating anticoagulants: Secondary | ICD-10-CM | POA: Diagnosis not present

## 2023-10-09 DIAGNOSIS — Z7901 Long term (current) use of anticoagulants: Secondary | ICD-10-CM | POA: Diagnosis not present

## 2023-10-09 DIAGNOSIS — J9621 Acute and chronic respiratory failure with hypoxia: Secondary | ICD-10-CM | POA: Diagnosis present

## 2023-10-09 DIAGNOSIS — Z8249 Family history of ischemic heart disease and other diseases of the circulatory system: Secondary | ICD-10-CM

## 2023-10-09 DIAGNOSIS — E782 Mixed hyperlipidemia: Secondary | ICD-10-CM | POA: Diagnosis not present

## 2023-10-09 DIAGNOSIS — J690 Pneumonitis due to inhalation of food and vomit: Principal | ICD-10-CM | POA: Diagnosis present

## 2023-10-09 DIAGNOSIS — I82503 Chronic embolism and thrombosis of unspecified deep veins of lower extremity, bilateral: Secondary | ICD-10-CM | POA: Diagnosis present

## 2023-10-09 DIAGNOSIS — J449 Chronic obstructive pulmonary disease, unspecified: Secondary | ICD-10-CM | POA: Diagnosis present

## 2023-10-09 DIAGNOSIS — Z85820 Personal history of malignant melanoma of skin: Secondary | ICD-10-CM

## 2023-10-09 DIAGNOSIS — E1169 Type 2 diabetes mellitus with other specified complication: Secondary | ICD-10-CM | POA: Diagnosis not present

## 2023-10-09 DIAGNOSIS — Z66 Do not resuscitate: Secondary | ICD-10-CM | POA: Diagnosis not present

## 2023-10-09 DIAGNOSIS — Z9981 Dependence on supplemental oxygen: Secondary | ICD-10-CM

## 2023-10-09 DIAGNOSIS — E871 Hypo-osmolality and hyponatremia: Secondary | ICD-10-CM | POA: Diagnosis present

## 2023-10-09 DIAGNOSIS — Z794 Long term (current) use of insulin: Secondary | ICD-10-CM

## 2023-10-09 DIAGNOSIS — R54 Age-related physical debility: Secondary | ICD-10-CM | POA: Diagnosis present

## 2023-10-09 DIAGNOSIS — N179 Acute kidney failure, unspecified: Secondary | ICD-10-CM | POA: Diagnosis not present

## 2023-10-09 DIAGNOSIS — I272 Pulmonary hypertension, unspecified: Secondary | ICD-10-CM | POA: Diagnosis present

## 2023-10-09 DIAGNOSIS — Z79899 Other long term (current) drug therapy: Secondary | ICD-10-CM

## 2023-10-09 DIAGNOSIS — T45515A Adverse effect of anticoagulants, initial encounter: Secondary | ICD-10-CM | POA: Diagnosis present

## 2023-10-09 DIAGNOSIS — Z8701 Personal history of pneumonia (recurrent): Secondary | ICD-10-CM

## 2023-10-09 DIAGNOSIS — R918 Other nonspecific abnormal finding of lung field: Secondary | ICD-10-CM | POA: Diagnosis not present

## 2023-10-09 DIAGNOSIS — L89151 Pressure ulcer of sacral region, stage 1: Secondary | ICD-10-CM | POA: Diagnosis not present

## 2023-10-09 DIAGNOSIS — R0902 Hypoxemia: Secondary | ICD-10-CM | POA: Diagnosis not present

## 2023-10-09 DIAGNOSIS — I959 Hypotension, unspecified: Secondary | ICD-10-CM | POA: Diagnosis present

## 2023-10-09 DIAGNOSIS — I469 Cardiac arrest, cause unspecified: Secondary | ICD-10-CM | POA: Diagnosis not present

## 2023-10-09 DIAGNOSIS — G4700 Insomnia, unspecified: Secondary | ICD-10-CM | POA: Diagnosis not present

## 2023-10-09 DIAGNOSIS — J9601 Acute respiratory failure with hypoxia: Secondary | ICD-10-CM | POA: Diagnosis not present

## 2023-10-09 DIAGNOSIS — I5043 Acute on chronic combined systolic (congestive) and diastolic (congestive) heart failure: Secondary | ICD-10-CM | POA: Diagnosis not present

## 2023-10-09 DIAGNOSIS — I10 Essential (primary) hypertension: Secondary | ICD-10-CM | POA: Diagnosis present

## 2023-10-09 DIAGNOSIS — I5033 Acute on chronic diastolic (congestive) heart failure: Secondary | ICD-10-CM | POA: Diagnosis not present

## 2023-10-09 DIAGNOSIS — U099 Post covid-19 condition, unspecified: Secondary | ICD-10-CM | POA: Diagnosis present

## 2023-10-09 DIAGNOSIS — Z833 Family history of diabetes mellitus: Secondary | ICD-10-CM

## 2023-10-09 DIAGNOSIS — N400 Enlarged prostate without lower urinary tract symptoms: Secondary | ICD-10-CM | POA: Diagnosis present

## 2023-10-09 LAB — BASIC METABOLIC PANEL
Anion gap: 14 (ref 5–15)
Anion gap: 11 (ref 5–15)
BUN: 29 mg/dL — ABNORMAL HIGH (ref 8–23)
BUN: 31 mg/dL — ABNORMAL HIGH (ref 8–23)
CO2: 26 mmol/L (ref 22–32)
CO2: 26 mmol/L (ref 22–32)
Calcium: 8.9 mg/dL (ref 8.9–10.3)
Calcium: 9 mg/dL (ref 8.9–10.3)
Chloride: 93 mmol/L — ABNORMAL LOW (ref 98–111)
Chloride: 95 mmol/L — ABNORMAL LOW (ref 98–111)
Creatinine, Ser: 1.03 mg/dL (ref 0.61–1.24)
Creatinine, Ser: 1.1 mg/dL (ref 0.61–1.24)
GFR, Estimated: >60 mL/min (ref >60)
GFR, Estimated: >60 mL/min (ref >60)
Glucose, Bld: 209 mg/dL — ABNORMAL HIGH (ref 70–99)
Glucose, Bld: 195 mg/dL — ABNORMAL HIGH (ref 70–99)
Potassium: 4.4 mmol/L (ref 3.5–5.1)
Potassium: 4.4 mmol/L (ref 3.5–5.1)
Sodium: 133 mmol/L — ABNORMAL LOW (ref 135–145)
Sodium: 132 mmol/L — ABNORMAL LOW (ref 135–145)

## 2023-10-09 LAB — CBC WITH DIFFERENTIAL/PLATELET
Abs Immature Granulocytes: 0.08 10*3/uL — ABNORMAL HIGH (ref 0.00–0.07)
Basophils Absolute: 0 10*3/uL (ref 0.0–0.1)
Basophils Relative: 0 %
Eosinophils Absolute: 0 10*3/uL (ref 0.0–0.5)
Eosinophils Relative: 0 %
HCT: 48.9 % (ref 39.0–52.0)
Hemoglobin: 15.1 g/dL (ref 13.0–17.0)
Immature Granulocytes: 1 %
Lymphocytes Relative: 7 %
Lymphs Abs: 0.9 10*3/uL (ref 0.7–4.0)
MCH: 27.2 pg (ref 26.0–34.0)
MCHC: 30.9 g/dL (ref 30.0–36.0)
MCV: 88.1 fL (ref 80.0–100.0)
Monocytes Absolute: 0.8 10*3/uL (ref 0.1–1.0)
Monocytes Relative: 7 %
Neutro Abs: 10.8 10*3/uL — ABNORMAL HIGH (ref 1.7–7.7)
Neutrophils Relative %: 85 %
Platelets: 203 10*3/uL (ref 150–400)
RBC: 5.55 MIL/uL (ref 4.22–5.81)
RDW: 16.8 % — ABNORMAL HIGH (ref 11.5–15.5)
WBC: 12.7 10*3/uL — ABNORMAL HIGH (ref 4.0–10.5)
nRBC: 0 % (ref 0.0–0.2)

## 2023-10-09 LAB — GLUCOSE, CAPILLARY
Glucose-Capillary: 193 mg/dL — ABNORMAL HIGH (ref 70–99)
Glucose-Capillary: 220 mg/dL — ABNORMAL HIGH (ref 70–99)

## 2023-10-09 LAB — BRAIN NATRIURETIC PEPTIDE
B Natriuretic Peptide: 705 pg/mL — ABNORMAL HIGH (ref 0.0–100.0)
B Natriuretic Peptide: 861 pg/mL — ABNORMAL HIGH (ref 0.0–100.0)

## 2023-10-09 LAB — PROTIME-INR
INR: 2 — ABNORMAL HIGH (ref 0.8–1.2)
Prothrombin Time: 22.7 s — ABNORMAL HIGH (ref 11.4–15.2)

## 2023-10-09 MED ORDER — CHLORHEXIDINE GLUCONATE CLOTH 2 % EX PADS
6.0000 | MEDICATED_PAD | Freq: Every day | CUTANEOUS | Status: DC
  Administered 2023-10-09 – 2023-10-13 (×5): 6 via TOPICAL

## 2023-10-09 MED ORDER — INSULIN GLARGINE-YFGN 100 UNIT/ML ~~LOC~~ SOLN
10.0000 [IU] | Freq: Every day | SUBCUTANEOUS | Status: DC
  Administered 2023-10-10 – 2023-10-13 (×4): 10 [IU] via SUBCUTANEOUS
  Filled 2023-10-09 (×5): qty 0.1

## 2023-10-09 MED ORDER — SERTRALINE HCL 100 MG PO TABS
100.0000 mg | ORAL_TABLET | Freq: Every morning | ORAL | Status: DC
  Administered 2023-10-10 – 2023-10-14 (×5): 100 mg via ORAL
  Filled 2023-10-09 (×5): qty 1

## 2023-10-09 MED ORDER — POLYETHYLENE GLYCOL 3350 17 G PO PACK: 17.0000 g | PACK | Freq: Every day | ORAL | Status: DC | PRN

## 2023-10-09 MED ORDER — FUROSEMIDE 10 MG/ML IJ SOLN
40.0000 mg | Freq: Two times a day (BID) | INTRAMUSCULAR | Status: DC
  Administered 2023-10-09 – 2023-10-10 (×2): 40 mg via INTRAVENOUS
  Filled 2023-10-09 (×2): qty 4

## 2023-10-09 MED ORDER — IPRATROPIUM BROMIDE 0.06 % NA SOLN
2.0000 | Freq: Four times a day (QID) | NASAL | Status: DC
  Administered 2023-10-09 – 2023-10-14 (×2): 2 via NASAL
  Filled 2023-10-09: qty 15

## 2023-10-09 MED ORDER — GUAIFENESIN ER 600 MG PO TB12: 600.0000 mg | ORAL_TABLET | Freq: Two times a day (BID) | ORAL | Status: DC | PRN

## 2023-10-09 MED ORDER — DOCUSATE SODIUM 100 MG PO CAPS: 100.0000 mg | ORAL_CAPSULE | Freq: Two times a day (BID) | ORAL | Status: DC | PRN

## 2023-10-09 MED ORDER — INSULIN GLARGINE (2 UNIT DIAL) 300 UNIT/ML ~~LOC~~ SOPN: 10.0000 [IU] | PEN_INJECTOR | Freq: Every morning | SUBCUTANEOUS | Status: DC

## 2023-10-09 MED ORDER — SODIUM CHLORIDE 0.9 % IV SOLN
3.0000 g | Freq: Once | INTRAVENOUS | Status: AC
  Administered 2023-10-09: 3 g via INTRAVENOUS
  Filled 2023-10-09: qty 8

## 2023-10-09 MED ORDER — ORAL CARE MOUTH RINSE: 15.0000 mL | OROMUCOSAL | Status: DC | PRN

## 2023-10-09 MED ORDER — METHYLPREDNISOLONE SODIUM SUCC 125 MG IJ SOLR
125.0000 mg | Freq: Once | INTRAMUSCULAR | Status: AC
  Administered 2023-10-09: 125 mg via INTRAVENOUS
  Filled 2023-10-09: qty 2

## 2023-10-09 MED ORDER — ALBUTEROL SULFATE (2.5 MG/3ML) 0.083% IN NEBU
2.5000 mg | INHALATION_SOLUTION | RESPIRATORY_TRACT | Status: DC | PRN
Start: 2023-10-09 — End: 2023-10-09

## 2023-10-09 MED ORDER — ADULT MULTIVITAMIN W/MINERALS CH
1.0000 | ORAL_TABLET | Freq: Every evening | ORAL | Status: DC
  Administered 2023-10-09 – 2023-10-13 (×4): 1 via ORAL
  Filled 2023-10-09 (×5): qty 1

## 2023-10-09 MED ORDER — EMPAGLIFLOZIN 25 MG PO TABS
25.0000 mg | ORAL_TABLET | Freq: Every day | ORAL | Status: DC
Start: 2023-10-10 — End: 2023-10-10
  Filled 2023-10-09: qty 1

## 2023-10-09 MED ORDER — SPIRONOLACTONE 12.5 MG HALF TABLET
12.5000 mg | ORAL_TABLET | Freq: Every day | ORAL | Status: DC
  Administered 2023-10-09: 12.5 mg via ORAL
  Filled 2023-10-09 (×2): qty 1

## 2023-10-09 MED ORDER — TORSEMIDE 20 MG PO TABS: 20.0000 mg | ORAL_TABLET | Freq: Two times a day (BID) | ORAL | Status: DC

## 2023-10-09 MED ORDER — ACETAMINOPHEN 500 MG PO TABS: 1000.0000 mg | ORAL_TABLET | Freq: Two times a day (BID) | ORAL | Status: DC | PRN

## 2023-10-09 MED ORDER — POTASSIUM CHLORIDE CRYS ER 20 MEQ PO TBCR: 20.0000 meq | EXTENDED_RELEASE_TABLET | Freq: Every day | ORAL | Status: DC

## 2023-10-09 MED ORDER — ONDANSETRON HCL 4 MG/2ML IJ SOLN
4.0000 mg | Freq: Four times a day (QID) | INTRAMUSCULAR | Status: DC | PRN
  Administered 2023-10-14: 4 mg via INTRAVENOUS
  Filled 2023-10-09: qty 2

## 2023-10-09 MED ORDER — EMPAGLIFLOZIN-METFORMIN HCL ER 12.5-1000 MG PO TB24: 1.0000 | ORAL_TABLET | Freq: Two times a day (BID) | ORAL | Status: DC

## 2023-10-09 MED ORDER — AMOXICILLIN-POT CLAVULANATE 875-125 MG PO TABS
1.0000 | ORAL_TABLET | Freq: Two times a day (BID) | ORAL | Status: DC
  Administered 2023-10-09: 1 via ORAL
  Filled 2023-10-09 (×2): qty 1

## 2023-10-09 MED ORDER — VITAMIN D 25 MCG (1000 UNIT) PO TABS
5000.0000 [IU] | ORAL_TABLET | Freq: Every morning | ORAL | Status: DC
  Administered 2023-10-10 – 2023-10-14 (×5): 5000 [IU] via ORAL
  Filled 2023-10-09 (×5): qty 5

## 2023-10-09 MED ORDER — ONDANSETRON HCL 4 MG PO TABS
4.0000 mg | ORAL_TABLET | Freq: Four times a day (QID) | ORAL | Status: DC | PRN
  Filled 2023-10-09: qty 1

## 2023-10-09 MED ORDER — TAMSULOSIN HCL 0.4 MG PO CAPS
0.4000 mg | ORAL_CAPSULE | Freq: Two times a day (BID) | ORAL | Status: DC
  Administered 2023-10-09: 0.4 mg via ORAL
  Filled 2023-10-09: qty 1

## 2023-10-09 MED ORDER — FUROSEMIDE 10 MG/ML IJ SOLN
20.0000 mg | Freq: Once | INTRAMUSCULAR | Status: AC
  Administered 2023-10-09: 20 mg via INTRAVENOUS
  Filled 2023-10-09: qty 2

## 2023-10-09 MED ORDER — MELATONIN 5 MG PO TABS
5.0000 mg | ORAL_TABLET | Freq: Every evening | ORAL | Status: DC | PRN
  Administered 2023-10-09 – 2023-10-13 (×5): 5 mg via ORAL
  Filled 2023-10-09 (×5): qty 1

## 2023-10-09 MED ORDER — FLUTICASONE FUROATE-VILANTEROL 100-25 MCG/ACT IN AEPB
1.0000 | INHALATION_SPRAY | Freq: Every day | RESPIRATORY_TRACT | Status: DC
  Filled 2023-10-09: qty 28

## 2023-10-09 MED ORDER — IPRATROPIUM-ALBUTEROL 0.5-2.5 (3) MG/3ML IN SOLN
3.0000 mL | Freq: Four times a day (QID) | RESPIRATORY_TRACT | Status: DC | PRN
Start: 2023-10-09 — End: 2023-10-14
  Filled 2023-10-09: qty 3

## 2023-10-09 MED ORDER — POTASSIUM CHLORIDE CRYS ER 20 MEQ PO TBCR: 40.0000 meq | EXTENDED_RELEASE_TABLET | Freq: Every day | ORAL | Status: DC

## 2023-10-09 MED ORDER — INSULIN ASPART 100 UNIT/ML IJ SOLN
2.0000 [IU] | Freq: Three times a day (TID) | INTRAMUSCULAR | Status: DC
  Administered 2023-10-10: 4 [IU] via SUBCUTANEOUS

## 2023-10-09 MED ORDER — LEVOCETIRIZINE DIHYDROCHLORIDE 5 MG PO TABS: 5.0000 mg | ORAL_TABLET | Freq: Every day | ORAL | Status: DC

## 2023-10-09 MED ORDER — LORATADINE 10 MG PO TABS
10.0000 mg | ORAL_TABLET | Freq: Every day | ORAL | Status: DC
Start: 2023-10-09 — End: 2023-10-14
  Administered 2023-10-09 – 2023-10-13 (×5): 10 mg via ORAL
  Filled 2023-10-09 (×5): qty 1

## 2023-10-09 MED ORDER — ALPRAZOLAM 0.25 MG PO TABS
0.2500 mg | ORAL_TABLET | Freq: Every day | ORAL | Status: DC | PRN
  Administered 2023-10-09 – 2023-10-14 (×4): 0.25 mg via ORAL
  Filled 2023-10-09 (×4): qty 1

## 2023-10-09 MED ORDER — METFORMIN HCL ER 500 MG PO TB24
1000.0000 mg | ORAL_TABLET | Freq: Two times a day (BID) | ORAL | Status: DC
  Administered 2023-10-10: 1000 mg via ORAL
  Filled 2023-10-09: qty 2

## 2023-10-09 MED ORDER — FAMOTIDINE 20 MG PO TABS
20.0000 mg | ORAL_TABLET | Freq: Every day | ORAL | Status: DC
  Administered 2023-10-09: 20 mg via ORAL
  Filled 2023-10-09: qty 1

## 2023-10-09 MED ORDER — PIPERACILLIN-TAZOBACTAM 3.375 G IVPB: 3.3750 g | Freq: Three times a day (TID) | INTRAVENOUS | Status: DC

## 2023-10-09 MED ORDER — BUPROPION HCL ER (XL) 150 MG PO TB24
150.0000 mg | ORAL_TABLET | Freq: Every morning | ORAL | Status: DC
  Administered 2023-10-10 – 2023-10-13 (×4): 150 mg via ORAL
  Filled 2023-10-09 (×5): qty 1

## 2023-10-09 MED ORDER — ROSUVASTATIN CALCIUM 5 MG PO TABS
5.0000 mg | ORAL_TABLET | Freq: Every day | ORAL | Status: DC
  Administered 2023-10-09 – 2023-10-13 (×5): 5 mg via ORAL
  Filled 2023-10-09 (×6): qty 1

## 2023-10-09 MED ORDER — PANTOPRAZOLE SODIUM 40 MG IV SOLR
40.0000 mg | INTRAVENOUS | Status: DC
  Administered 2023-10-09: 40 mg via INTRAVENOUS
  Filled 2023-10-09: qty 10

## 2023-10-09 MED ORDER — ACETAMINOPHEN 500 MG PO TABS
1000.0000 mg | ORAL_TABLET | Freq: Two times a day (BID) | ORAL | Status: DC | PRN
  Administered 2023-10-10: 1000 mg via ORAL
  Filled 2023-10-09 (×2): qty 2

## 2023-10-09 MED ORDER — FUROSEMIDE 10 MG/ML IJ SOLN
40.0000 mg | INTRAMUSCULAR | Status: AC
  Administered 2023-10-09: 40 mg via INTRAVENOUS
  Filled 2023-10-09: qty 4

## 2023-10-09 NOTE — ED Triage Notes (Signed)
Nosebleed that started today.  Pt wears 7 liters o2 at home.  Sats in 80's upon ems arrival.  Placed on NRB and sats came up to 97% prior to arrival to ED.  Given 2 sprays of afrin in each nare by ems.  Takes eliquis.
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
## 2023-10-09 NOTE — ED Notes (Signed)
EDP at bedside and placed Rapid rhino to right nare

## 2023-10-09 NOTE — Progress Notes (Addendum)
eLink Physician-Brief Progress Note Patient Name: Tim Day DOB: 08/23/1951 MRN: [id number]   Date of Service  10/16/2023  HPI/Events of Note  72 year old male, he has a history of multiple DVTs on Eliquis, history of heart failure and lung disease who initially presents with a heavy nosebleed from the right side resulting in acute hypoxic respiratory failure.  On presentation, the patient is tachypneic, tachycardic, and normotensive.  Saturating 94% on 100% FiO2 nonrebreather.  Laboratory studies show unremarkable metabolic panel, minimally elevated BNP, mild leukocytosis with a hemoglobin of 15.  INR elevated to 2 on home apixaban.  Basilar infiltrates noted.  eICU Interventions  Maintain scheduled antibiotics, scheduled Lasix.  Trend hemoglobin  GI prophylaxis with pantoprazole-replacing home famotidine. DVT prophylaxis with SCDs   2045 -substantial baseline anxiety, on home Xanax.  Resume home Xanax for now.  2142 -experiencing more postnasal drip-nothing on bedside observation.  Somewhat expected if he has ongoing bleeding.  Continue observation.  He feels well otherwise and is protecting his airway with appropriate gag and cough.  Speaking in full sentences.  2329 -add melatonin for insomnia  0011 -add sliding scale insulin  Intervention Category Evaluation Type: New Patient Evaluation  Tim Day 10/10/2023, 7:16 PM

## 2023-10-09 NOTE — Progress Notes (Signed)
Pharmacy Antibiotic Note  Tim Day is a 72 y.o. male admitted on 10/09/2023 with  aspiration pneumonia .  Pharmacy has been consulted for zosyn dosing.  Plan: Zosyn 3.375g IV q8h (4 hour infusion).  Height: 5\' 10"  (177.8 cm) Weight: 78 kg (171 lb 15.3 oz) IBW/kg (Calculated) : 73  Temp (24hrs), Avg:98.5 F (36.9 C), Min:98.5 F (36.9 C), Max:98.5 F (36.9 C)  Recent Labs  Lab 10/09/23 1131 10/09/23 1408  WBC  --  12.7*  CREATININE 1.03 1.10    Estimated Creatinine Clearance: 64.5 mL/min (by C-G formula based on SCr of 1.1 mg/dL).    No Known Allergies  Antimicrobials this admission: Zosyn 11/7 >> Unasyn 11/7  Microbiology results: None pending  Thank you for allowing pharmacy to be a part of this patient's care.  Tad Moore 10/09/2023 4:15 PM
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
## 2023-10-09 NOTE — Telephone Encounter (Signed)
CT canceled.  Patient aware to keep f/u ov. Nothing further needed at this time.

## 2023-10-09 NOTE — ED Provider Notes (Addendum)
[facility] DEPARTMENT AT [facility] Provider Note   CSN: [id number] Arrival date & time: 10/09/23  1345     History  Chief Complaint  Patient presents with   Epistaxis    Tim Day is a 71 y.o. male.   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  Most recent echocardiogram performed about a month ago showed that the patient had an ejection fraction of 50%, indeterminate left ventricular parameters  He presents today with a nosebleed, this has been very heavy, right sided, occurred after he had been blowing his nose.  He states that the blood was very heavy and because he is on 7 L of oxygen he was having difficulty using his oxygen.  He presents in respiratory distress with very low oxygen saturations, extremely tachypneic and in distress with heavy bright red blood from his right nostril.  The posterior pharynx is full of blood as well.    Home Medications Prior to Admission medications   Medication Sig Start Date End Date Taking? Authorizing Provider  ACCU-CHEK GUIDE test strip USE TO TEST TWICE DAILY.D 02/06/21   [provider]  acetaminophen (TYLENOL) 500 MG tablet Take 1,000 mg by mouth 2 (two) times daily as needed for moderate pain.    [provider]  albuterol (VENTOLIN HFA) 108 (90 Base) MCG/ACT inhaler Inhale 2 puffs into the lungs every 6 (six) hours as needed for wheezing or shortness of breath. 08/20/23   Coralyn Helling, MD  alclomethasone (ACLOVATE) 0.05 % cream Apply topically 2 (two) times daily as needed (Rash). 03/13/22   Sheffield, Judye Bos, PA-C  ALPRAZolam (XANAX) 0.25 MG tablet Take 0.25 mg by mouth daily as needed for anxiety.    [provider]  apixaban (ELIQUIS) 5 MG TABS tablet Take 1 tablet (5 mg total) by mouth 2 (two) times daily. 05/11/22   Vassie Loll, MD   ascorbic acid (VITAMIN C) 500 MG tablet Take 1 tablet (500 mg total) by mouth daily. 08/04/20   Johnson, Clanford L, MD  blood glucose meter kit and supplies KIT Dispense based on patient and insurance preference. Use up to four times daily as directed. (FOR ICD-9 250.00, 250.01). 08/14/20   Azucena Fallen, MD  buPROPion (WELLBUTRIN XL) 150 MG 24 hr tablet Take 150 mg by mouth in the morning. 04/25/22   [provider]  Cholecalciferol (VITAMIN D3) 125 MCG (5000 UT) CAPS Take 5,000 Units by mouth in the morning.    [provider]  Chromium-Cinnamon (CINNAMON PLUS CHROMIUM PO) Take 1 tablet by mouth in the morning.    [provider]  CO-ENZYME Q-10 PO Take 100 mg by mouth in the morning.    [provider]  famotidine (PEPCID) 20 MG tablet TAKE ONE TABLET BY MOUTH DAILY AFTER SUPPER 09/05/23   Cobb, Ruby Cola, NP  fluticasone furoate-vilanterol (BREO ELLIPTA) 100-25 MCG/ACT AEPB Inhale 1 puff into the lungs daily. 09/05/23   Leslye Peer, MD  insulin aspart (NOVOLOG FLEXPEN) 100 UNIT/ML FlexPen Inject 2-18 Units into the skin daily after supper. Sliding scale    [provider]  Insulin Pen Needle (PEN NEEDLES) 32G X 4 MM MISC 1 Package by Does not apply route 4 (four) times daily -  before meals and at bedtime. 08/14/20   Azucena Fallen, MD  ipratropium (ATROVENT) 0.06 %  nasal spray Place 2 sprays into both nostrils 4 (four) times daily. Patient taking differently: Place 2 sprays into both nostrils 4 (four) times daily as needed (allergies.). 10/07/22   Nyoka Cowden, MD  ipratropium-albuterol (DUONEB) 0.5-2.5 (3) MG/3ML SOLN Take 3 mLs by nebulization every 6 (six) hours as needed. 08/20/23   Coralyn Helling, MD  Lancets [facility] DELICA PLUS [place]) MISC Apply topically 2 (two) times daily. 12/07/21   [provider]  levocetirizine (XYZAL) 5 MG tablet Take 5 mg by mouth at bedtime.    [provider]  Multiple Vitamin  (MULTIVITAMIN WITH MINERALS) TABS tablet Take 1 tablet by mouth every evening.    [provider]  pantoprazole (PROTONIX) 40 MG tablet Take 40 mg by mouth at bedtime.    [provider]  potassium chloride SA (KLOR-CON M) 20 MEQ tablet Take 2 tablets (40 mEq total) by mouth daily. 09/25/23   Zannie Cove, MD  rosuvastatin (CRESTOR) 5 MG tablet Take 5 mg by mouth at bedtime. 03/05/21   [provider]  sertraline (ZOLOFT) 100 MG tablet Take 100 mg by mouth in the morning.    [provider]  spironolactone (ALDACTONE) 25 MG tablet Take 0.5 tablets (12.5 mg total) by mouth daily. 09/26/23   Zannie Cove, MD  SYNJARDY XR 12.04-999 MG TB24 Take 1 tablet by mouth 2 (two) times daily. 09/23/22   [provider]  tamsulosin (FLOMAX) 0.4 MG CAPS capsule Take 0.4 mg by mouth 2 (two) times daily. 01/28/22   [provider]  torsemide (DEMADEX) 20 MG tablet Take 1 tablet (20 mg total) by mouth 2 (two) times daily. 10/02/23   Simmons, Brittainy M, PA-C  TOUJEO MAX SOLOSTAR 300 UNIT/ML Solostar Pen Inject 10 Units into the skin in the morning. 09/25/23   Zannie Cove, MD  UNABLE TO FIND Med Name: Mucinex  by mouth every 12 hours as needed    [provider]  zinc sulfate 220 (50 Zn) MG capsule Take 1 capsule (220 mg total) by mouth daily. Patient taking differently: Take 220 mg by mouth every evening. 08/04/20   Cleora Fleet, MD      Allergies    Patient has no known allergies.    Review of Systems   Review of Systems  HENT:  Positive for nosebleeds.   All other systems reviewed and are negative.   Physical Exam Updated Vital Signs BP 106/83   Pulse (!) 121   Temp 98.5 F (36.9 C) (Axillary)   Resp (!) 25   Ht 1.778 m (5\' 10" )   Wt 78 kg   SpO2 93%   BMI 24.67 kg/m  Physical Exam Vitals and nursing note reviewed.  Constitutional:      General: He is in acute distress.     Appearance: He is well-developed. He is  ill-appearing.  HENT:     Head: Normocephalic and atraumatic.     Nose:     Comments: Some blood in the left nostril, right nostril full of bright red blood with active bleeding anteriorly    Mouth/Throat:     Mouth: Mucous membranes are moist.     Pharynx: No oropharyngeal exudate.     Comments: Posterior pharynx with clotted blood Eyes:     General: No scleral icterus.       Right eye: No discharge.        Left eye: No discharge.     Conjunctiva/sclera: Conjunctivae normal.     Pupils: Pupils  are equal, round, and reactive to light.  Neck:     Thyroid: No thyromegaly.     Vascular: No JVD.  Cardiovascular:     Rate and Rhythm: Regular rhythm. Tachycardia present.     Heart sounds: Normal heart sounds. No murmur heard.    No friction rub. No gallop.  Pulmonary:     Effort: Pulmonary effort is normal. No respiratory distress.     Breath sounds: No wheezing or rales.     Comments: Tachypneic but clear breath sounds Abdominal:     General: Bowel sounds are normal. There is no distension.     Palpations: Abdomen is soft. There is no mass.     Tenderness: There is no abdominal tenderness.  Musculoskeletal:        General: No tenderness. Normal range of motion.     Cervical back: Normal range of motion and neck supple.     Right lower leg: Edema present.     Left lower leg: Edema present.  Lymphadenopathy:     Cervical: No cervical adenopathy.  Skin:    General: Skin is warm and dry.     Findings: No erythema or rash.  Neurological:     Mental Status: He is alert.     Coordination: Coordination normal.  Psychiatric:        Behavior: Behavior normal.     ED Results / Procedures / Treatments   Labs (all labs ordered are listed, but only abnormal results are displayed) Labs Reviewed  CBC WITH DIFFERENTIAL/PLATELET - Abnormal; Notable for the following components:      Result Value   WBC 12.7 (*)    RDW 16.8 (*)    Neutro Abs 10.8 (*)    Abs Immature Granulocytes 0.08  (*)    All other components within normal limits  BASIC METABOLIC PANEL - Abnormal; Notable for the following components:   Sodium 132 (*)    Chloride 95 (*)    Glucose, Bld 195 (*)    BUN 31 (*)    All other components within normal limits  PROTIME-INR - Abnormal; Notable for the following components:   Prothrombin Time 22.7 (*)    INR 2.0 (*)    All other components within normal limits  BRAIN NATRIURETIC PEPTIDE    EKG None  Radiology No results found.  Procedures .Critical Care  Performed by: Eber Hong, MD Authorized by: Eber Hong, MD   Critical care provider statement:    Critical care time (minutes):  45   Critical care time was exclusive of:  Separately billable procedures and treating other patients and teaching time   Critical care was necessary to treat or prevent imminent or life-threatening deterioration of the following conditions: hemorrhage.   Critical care was time spent personally by me on the following activities:  Development of treatment plan with patient or surrogate, discussions with consultants, evaluation of patient's response to treatment, examination of patient, obtaining history from patient or surrogate, review of old charts, re-evaluation of patient's condition, pulse oximetry, ordering and review of radiographic studies, ordering and review of laboratory studies and ordering and performing treatments and interventions   I assumed direction of critical care for this patient from another provider in my specialty: no     Care discussed with: admitting provider   Comments:           Medications Ordered in ED Medications  Ampicillin-Sulbactam (UNASYN) 3 g in sodium chloride 0.9 % 100 mL IVPB (has no administration  in time range)    ED Course/ Medical Decision Making/ A&P                                 Medical Decision Making Amount and/or Complexity of Data Reviewed Labs: ordered. Radiology: ordered. ECG/medicine tests:  ordered.  Risk Decision regarding hospitalization.    This patient presents to the ED for concern of nosebleed on higher dose oxygen differential diagnosis includes coagulopathy, could be a regular anterior nosebleed but the major concern is for his hypoxia and need for oxygen without the ability to use both nostrils.    Additional history obtained:  Additional history obtained from spouse and medical record External records from outside source obtained and reviewed including prior hospitalizations echocardiograms etc.   Lab Tests:  I Ordered, and personally interpreted labs.  The pertinent results include: Hemoglobin seems to be stable, white blood cell count of 12,700, persistently elevated, metabolic panel with a BUN of 31 creatinine of 1.1 and essentially normal electrolytes other than mild hyponatremia.  INR is 2.0   Imaging Studies ordered:  I ordered imaging studies including chest x-ray I independently visualized and interpreted imaging which showed hypoexpansion and possible left lower lobe infiltrate I agree with the radiologist interpretation   Medicines ordered and prescription drug management:  Afrin was given prehospital with pressure, this did not stop the bleeding.  Because of the brisk hemorrhage and the patient's requiring increased oxygenation with an oxygen sat of 70% on arrival he required a nonrebreather and a inflatable nasal pack in the right nostril, see the procedure note.  He did well with this but it required a nonrebreather at 15 L to get his oxygen up to the low 90% range.  I did discuss the case with the ear nose and throat doctor Dr. Jearld Fenton who is going to be a part of the patient's care as needed however after the pack was placed the bleeding seems to have stopped, I have rechecked the patient and checked his posterior pharynx as well as his anterior nose and there is just a minimal amount of blood.  His blood pressure is 106/83, I will discuss with the  hospitalist for admission - Discussed with  Dr. Seth Bake who will admit and facilitate transfer to [facility] hospital - progressive care. The patient is critically ill secondary to hypoxic respiratory failure in the presence of epistaxis and hemorrhagic bleeding in an anticoagulated patient Reevaluation of the patient after these medicines showed that the patient critically ill but improving I have reviewed the patients home medicines and have made adjustments as needed  Consultations:  Dr., Jearld Fenton with ENT, Hospitalist to admit.  Problem List / ED Course:  As above the patient improved   Social Determinants of Health:  Anticoagulated           Final Clinical Impression(s) / ED Diagnoses Final diagnoses:  Epistaxis  Acute respiratory failure with hypoxia [facility])    Rx / DC Orders ED Discharge Orders     None         Eber Hong, MD 10/09/23 1446    Eber Hong, MD 10/09/23 1517

## 2023-10-10 DIAGNOSIS — Z7901 Long term (current) use of anticoagulants: Secondary | ICD-10-CM | POA: Diagnosis not present

## 2023-10-10 DIAGNOSIS — J9621 Acute and chronic respiratory failure with hypoxia: Secondary | ICD-10-CM | POA: Diagnosis not present

## 2023-10-10 DIAGNOSIS — I825Y9 Chronic embolism and thrombosis of unspecified deep veins of unspecified proximal lower extremity: Secondary | ICD-10-CM

## 2023-10-10 DIAGNOSIS — J9601 Acute respiratory failure with hypoxia: Secondary | ICD-10-CM | POA: Diagnosis not present

## 2023-10-10 DIAGNOSIS — J8410 Pulmonary fibrosis, unspecified: Secondary | ICD-10-CM

## 2023-10-10 DIAGNOSIS — I5033 Acute on chronic diastolic (congestive) heart failure: Secondary | ICD-10-CM | POA: Diagnosis not present

## 2023-10-10 DIAGNOSIS — R040 Epistaxis: Secondary | ICD-10-CM | POA: Diagnosis not present

## 2023-10-10 LAB — GLUCOSE, CAPILLARY
Glucose-Capillary: 213 mg/dL — ABNORMAL HIGH (ref 70–99)
Glucose-Capillary: 187 mg/dL — ABNORMAL HIGH (ref 70–99)
Glucose-Capillary: 140 mg/dL — ABNORMAL HIGH (ref 70–99)
Glucose-Capillary: 141 mg/dL — ABNORMAL HIGH (ref 70–99)
Glucose-Capillary: 192 mg/dL — ABNORMAL HIGH (ref 70–99)

## 2023-10-10 LAB — HEMOGLOBIN AND HEMATOCRIT, BLOOD
HCT: 44.7 % (ref 39.0–52.0)
HCT: 45.7 % (ref 39.0–52.0)
Hemoglobin: 13.7 g/dL (ref 13.0–17.0)
Hemoglobin: 14 g/dL (ref 13.0–17.0)

## 2023-10-10 LAB — BASIC METABOLIC PANEL
Anion gap: 14 (ref 5–15)
BUN: 39 mg/dL — ABNORMAL HIGH (ref 8–23)
CO2: 27 mmol/L (ref 22–32)
Calcium: 8.7 mg/dL — ABNORMAL LOW (ref 8.9–10.3)
Chloride: 95 mmol/L — ABNORMAL LOW (ref 98–111)
Creatinine, Ser: 1.44 mg/dL — ABNORMAL HIGH (ref 0.61–1.24)
GFR, Estimated: 52 mL/min — ABNORMAL LOW (ref >60)
Glucose, Bld: 213 mg/dL — ABNORMAL HIGH (ref 70–99)
Potassium: 4.5 mmol/L (ref 3.5–5.1)
Sodium: 136 mmol/L (ref 135–145)

## 2023-10-10 LAB — PHOSPHORUS: Phosphorus: 7.6 mg/dL — ABNORMAL HIGH (ref 2.5–4.6)

## 2023-10-10 LAB — CBC
HCT: 46.7 % (ref 39.0–52.0)
Hemoglobin: 14.3 g/dL (ref 13.0–17.0)
MCH: 27.7 pg (ref 26.0–34.0)
MCHC: 30.6 g/dL (ref 30.0–36.0)
MCV: 90.3 fL (ref 80.0–100.0)
Platelets: 155 10*3/uL (ref 150–400)
RBC: 5.17 MIL/uL (ref 4.22–5.81)
RDW: 16.6 % — ABNORMAL HIGH (ref 11.5–15.5)
WBC: 6 10*3/uL (ref 4.0–10.5)
nRBC: 0 % (ref 0.0–0.2)

## 2023-10-10 LAB — MAGNESIUM: Magnesium: 2.3 mg/dL (ref 1.7–2.4)

## 2023-10-10 MED ORDER — SODIUM CHLORIDE 0.9 % IV SOLN
3.0000 g | Freq: Four times a day (QID) | INTRAVENOUS | Status: DC
  Administered 2023-10-10 – 2023-10-11 (×4): 3 g via INTRAVENOUS
  Filled 2023-10-10 (×4): qty 8

## 2023-10-10 MED ORDER — IPRATROPIUM-ALBUTEROL 0.5-2.5 (3) MG/3ML IN SOLN
3.0000 mL | RESPIRATORY_TRACT | Status: DC
  Administered 2023-10-10 – 2023-10-13 (×18): 3 mL via RESPIRATORY_TRACT
  Filled 2023-10-10 (×18): qty 3

## 2023-10-10 MED ORDER — INSULIN ASPART 100 UNIT/ML IJ SOLN
0.0000 [IU] | Freq: Every day | INTRAMUSCULAR | Status: DC
  Administered 2023-10-10: 2 [IU] via SUBCUTANEOUS

## 2023-10-10 MED ORDER — INSULIN ASPART 100 UNIT/ML IJ SOLN
0.0000 [IU] | Freq: Three times a day (TID) | INTRAMUSCULAR | Status: DC
  Administered 2023-10-10: 2 [IU] via SUBCUTANEOUS
  Administered 2023-10-10: 3 [IU] via SUBCUTANEOUS
  Administered 2023-10-10 – 2023-10-11 (×2): 2 [IU] via SUBCUTANEOUS
  Administered 2023-10-11 (×2): 3 [IU] via SUBCUTANEOUS
  Administered 2023-10-12: 5 [IU] via SUBCUTANEOUS
  Administered 2023-10-12: 3 [IU] via SUBCUTANEOUS
  Administered 2023-10-12 – 2023-10-13 (×2): 2 [IU] via SUBCUTANEOUS
  Administered 2023-10-13: 8 [IU] via SUBCUTANEOUS
  Administered 2023-10-13: 5 [IU] via SUBCUTANEOUS

## 2023-10-10 MED ORDER — ALPRAZOLAM 0.5 MG PO TABS
0.5000 mg | ORAL_TABLET | Freq: Every evening | ORAL | Status: DC | PRN
  Administered 2023-10-10 – 2023-10-13 (×3): 0.5 mg via ORAL
  Filled 2023-10-10 (×3): qty 1

## 2023-10-10 MED ORDER — TRANEXAMIC ACID FOR EPISTAXIS
1000.0000 mg | Freq: Once | TOPICAL | Status: DC
  Filled 2023-10-10: qty 10
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
## 2023-10-10 NOTE — Progress Notes (Signed)
   10/10/23 1436  Spiritual Encounters  Type of Visit Follow up  Care provided to: Pt and family  Conversation partners present during encounter Nurse  Referral source Chaplain team  Reason for visit Routine spiritual support  OnCall Visit No   Chaplain visited Pt at recommendation of teammate who notes that I have visited him on his last admission. Pt seems weaker than last time, and his family appears to be more worried. Chaplain processed the latest admission with the family and responded by offering the prayer they asked for. Chaplain will continue to follow this Pt and family as they seem to appreciate spiritual visits.  Chaplain services remain available by Spiritual Consult or for emergent cases, paging [phone]  Chaplain Raelene Bott, MDiv Isai [email or web address] [phone]

## 2023-10-10 NOTE — Progress Notes (Signed)
Heart Failure Navigator Progress Note  Assessed for Heart & Vascular TOC clinic readiness.  Patient is already established with the advanced heart failure team. Patient of Dr. Gala Romney.   Sharen Hones, PharmD, BCPS Heart Failure Stewardship Pharmacist Phone [phone]

## 2023-10-10 NOTE — Progress Notes (Signed)
No significant epistaxis since packed earlier today.

## 2023-10-10 NOTE — TOC CM/SW Note (Addendum)
CSW met pt and wife at bedside. Wife assisted in answering questions for the pt. Tim Day is from home with his wife. Wife, Kathie Rhodes, reports he has had HH with [facility] the past 3 admissions and will continue with [facility] if [facility] is recommended again. They have 1 adult child, who lives next door. Wife is able to transport pt and states that pt has not driven in "years."   Pt has a PCP and uses [facility] information [facility] manager. Pts wife reports they have the following DME: grab bars, rollator, cane , shower seat, bedside commode, and a wheelchair from son-in-laws grandfather. Per chart review, pt in on 7L of O2 at home. TOC will continue to follow.
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
## 2023-10-10 NOTE — Progress Notes (Signed)
eLink Physician-Brief Progress Note Patient Name: Tim Day DOB: [birth year] MRN: [id number]   Date of Service  10/10/2023  HPI/Events of Note  72 year old with COPD who was admitted yesterday due to profuse epistaxis from the right side of the nose.  Profound hypoxemia as a result.  Continues to be anxious despite taking his home as needed Xanax and melatonin.  Normally uses BiPAP but unable to do so in the setting of [employer]'s.  eICU Interventions  Add additional dose of Xanax as needed     Intervention Category Minor Interventions: Agitation / anxiety - evaluation and management  Gisell Buehrle 10/10/2023, 10:55 PM

## 2023-10-11 ENCOUNTER — Inpatient Hospital Stay (HOSPITAL_COMMUNITY): Payer: PPO

## 2023-10-11 DIAGNOSIS — Z7901 Long term (current) use of anticoagulants: Secondary | ICD-10-CM | POA: Diagnosis not present

## 2023-10-11 DIAGNOSIS — R040 Epistaxis: Secondary | ICD-10-CM | POA: Diagnosis not present

## 2023-10-11 LAB — CBC WITH DIFFERENTIAL/PLATELET
Abs Immature Granulocytes: 0.08 10*3/uL — ABNORMAL HIGH (ref 0.00–0.07)
Basophils Absolute: 0 10*3/uL (ref 0.0–0.1)
Basophils Relative: 0 %
Eosinophils Absolute: 0 10*3/uL (ref 0.0–0.5)
Eosinophils Relative: 0 %
HCT: 48.9 % (ref 39.0–52.0)
Hemoglobin: 14.5 g/dL (ref 13.0–17.0)
Immature Granulocytes: 1 %
Lymphocytes Relative: 7 %
Lymphs Abs: 0.9 10*3/uL (ref 0.7–4.0)
MCH: 27.2 pg (ref 26.0–34.0)
MCHC: 29.7 g/dL — ABNORMAL LOW (ref 30.0–36.0)
MCV: 91.6 fL (ref 80.0–100.0)
Monocytes Absolute: 0.8 10*3/uL (ref 0.1–1.0)
Monocytes Relative: 6 %
Neutro Abs: 10.8 10*3/uL — ABNORMAL HIGH (ref 1.7–7.7)
Neutrophils Relative %: 86 %
Platelets: 200 10*3/uL (ref 150–400)
RBC: 5.34 MIL/uL (ref 4.22–5.81)
RDW: 16.7 % — ABNORMAL HIGH (ref 11.5–15.5)
WBC: 12.5 10*3/uL — ABNORMAL HIGH (ref 4.0–10.5)
nRBC: 0 % (ref 0.0–0.2)

## 2023-10-11 LAB — COMPREHENSIVE METABOLIC PANEL
ALT: 49 U/L — ABNORMAL HIGH (ref 0–44)
AST: 34 U/L (ref 15–41)
Albumin: 3 g/dL — ABNORMAL LOW (ref 3.5–5.0)
Alkaline Phosphatase: 77 U/L (ref 38–126)
Anion gap: 10 (ref 5–15)
BUN: 51 mg/dL — ABNORMAL HIGH (ref 8–23)
CO2: 29 mmol/L (ref 22–32)
Calcium: 9.2 mg/dL (ref 8.9–10.3)
Chloride: 98 mmol/L (ref 98–111)
Creatinine, Ser: 1.42 mg/dL — ABNORMAL HIGH (ref 0.61–1.24)
GFR, Estimated: 53 mL/min — ABNORMAL LOW (ref >60)
Glucose, Bld: 172 mg/dL — ABNORMAL HIGH (ref 70–99)
Potassium: 4.7 mmol/L (ref 3.5–5.1)
Sodium: 137 mmol/L (ref 135–145)
Total Bilirubin: 0.9 mg/dL (ref <1.2)
Total Protein: 7.1 g/dL (ref 6.5–8.1)

## 2023-10-11 LAB — GLUCOSE, CAPILLARY
Glucose-Capillary: 185 mg/dL — ABNORMAL HIGH (ref 70–99)
Glucose-Capillary: 158 mg/dL — ABNORMAL HIGH (ref 70–99)
Glucose-Capillary: 140 mg/dL — ABNORMAL HIGH (ref 70–99)
Glucose-Capillary: 149 mg/dL — ABNORMAL HIGH (ref 70–99)

## 2023-10-11 LAB — BASIC METABOLIC PANEL
Anion gap: 12 (ref 5–15)
BUN: 64 mg/dL — ABNORMAL HIGH (ref 8–23)
CO2: 32 mmol/L (ref 22–32)
Calcium: 9.4 mg/dL (ref 8.9–10.3)
Chloride: 93 mmol/L — ABNORMAL LOW (ref 98–111)
Creatinine, Ser: 1.6 mg/dL — ABNORMAL HIGH (ref 0.61–1.24)
GFR, Estimated: 46 mL/min — ABNORMAL LOW (ref >60)
Glucose, Bld: 104 mg/dL — ABNORMAL HIGH (ref 70–99)
Potassium: 4.8 mmol/L (ref 3.5–5.1)
Sodium: 137 mmol/L (ref 135–145)

## 2023-10-11 LAB — CBC
HCT: 52.3 % — ABNORMAL HIGH (ref 39.0–52.0)
Hemoglobin: 15.3 g/dL (ref 13.0–17.0)
MCH: 27.1 pg (ref 26.0–34.0)
MCHC: 29.3 g/dL — ABNORMAL LOW (ref 30.0–36.0)
MCV: 92.7 fL (ref 80.0–100.0)
Platelets: 221 10*3/uL (ref 150–400)
RBC: 5.64 MIL/uL (ref 4.22–5.81)
RDW: 16.6 % — ABNORMAL HIGH (ref 11.5–15.5)
WBC: 11.1 10*3/uL — ABNORMAL HIGH (ref 4.0–10.5)
nRBC: 0.2 % (ref 0.0–0.2)

## 2023-10-11 LAB — PROTIME-INR
INR: 1.5 — ABNORMAL HIGH (ref 0.8–1.2)
Prothrombin Time: 17.8 s — ABNORMAL HIGH (ref 11.4–15.2)

## 2023-10-11 LAB — APTT: aPTT: 33 s (ref 24–36)

## 2023-10-11 MED ORDER — FAMOTIDINE IN NACL 20-0.9 MG/50ML-% IV SOLN
20.0000 mg | INTRAVENOUS | Status: DC
  Administered 2023-10-11: 20 mg via INTRAVENOUS
  Filled 2023-10-11: qty 50

## 2023-10-11 MED ORDER — TRANEXAMIC ACID FOR INHALATION
500.0000 mg | Freq: Three times a day (TID) | RESPIRATORY_TRACT | Status: AC
  Administered 2023-10-11 – 2023-10-13 (×6): 500 mg via RESPIRATORY_TRACT
  Filled 2023-10-11 (×3): qty 5
  Filled 2023-10-11: qty 10
  Filled 2023-10-11: qty 5
  Filled 2023-10-11: qty 10

## 2023-10-11 MED ORDER — SODIUM CHLORIDE 0.9 % IV SOLN
2.0000 g | INTRAVENOUS | Status: AC
  Administered 2023-10-11 – 2023-10-13 (×3): 2 g via INTRAVENOUS
  Filled 2023-10-11 (×3): qty 20

## 2023-10-11 MED ORDER — TORSEMIDE 20 MG PO TABS
20.0000 mg | ORAL_TABLET | Freq: Every day | ORAL | Status: DC
  Filled 2023-10-11: qty 1

## 2023-10-11 NOTE — Progress Notes (Signed)
   10/11/23 2128  BiPAP/CPAP/SIPAP  $ Non-Invasive Ventilator  Non-Invasive Vent Subsequent  BiPAP/CPAP/SIPAP Pt Type Adult  BiPAP/CPAP/SIPAP SERVO  Mask Type Full face mask  Mask Size Large  Set Rate 16 breaths/min  Respiratory Rate 23 breaths/min  Pressure Support 12 cmH20  PEEP 6 cmH20  FiO2 (%) 50 %  Minute Ventilation 10.7  Leak 90  Peak Inspiratory Pressure (PIP) 17  Patient Home Equipment No  Nasal massage performed Yes  CPAP/SIPAP surface wiped down Yes  BiPAP/CPAP /SiPAP Vitals  Bilateral Breath Sounds Diminished

## 2023-10-11 NOTE — Plan of Care (Signed)
  Problem: Education: Goal: Knowledge of General Education information will improve Description: Including pain rating scale, medication(s)/side effects and non-pharmacologic comfort measures Outcome: Progressing   Problem: Clinical Measurements: Goal: Ability to maintain clinical measurements within normal limits will improve Outcome: Progressing   Problem: Activity: Goal: Risk for activity intolerance will decrease Outcome: Progressing   Problem: Nutrition: Goal: Adequate nutrition will be maintained Outcome: Progressing   Problem: Elimination: Goal: Will not experience complications related to bowel motility Outcome: Progressing   

## 2023-10-11 NOTE — Progress Notes (Addendum)
NAME:  Tim Day, MRN:  [id number], DOB:  23-Jun-1951, LOS: 2 ADMISSION DATE:  10/10/2023, CHIEF COMPLAINT:  Epistaxis   History of Present Illness:   Tim Day is a 72 y.o. male who has a PMH as below including but not limited to [facility] (Echo from Oct 2024 with EF 50%), HTN, HLD, COPD, post COVID fibrosis, chronic hypoxic respiratory failure on 7-8L O2, DVT on Eliquis. He had recent admission 09/14/23 through 09/25/23 for cardiogenic shock requiring diuresis with inotropic support in the form of DBA. He presented to [facility] ED 11/7 with significant Epistaxis, right sided after he went to blow his nose that morning. He began to have heavy bleeding followed by dyspnea and increased WOB. He increased O2 to 5-7L but had minimal symptomatic relief.   In ED, he had posterior pharynx full of blood. He was suctioned and had inflatable nasal packing placed. He required 15L NRB to maintain sats > 90%. He continued to have increased WOB but he and family confirmed DNR/DNI to [facility]. ENT was consulted and requested transfer to [facility] where they will evaluate him and determine best course of action.   He was also noted to have bilateral pitting LE edema and appeared to be volume overloaded. Diuresis was ordered; however, pressures were on the softer side. PCCM was subsequently consulted as there was concern he would require inotropic or vasopressor support once he arrived to [facility] given need for ongoing diuresis.  Pertinent  Medical History   -T2DM -GERD -VTE - DVT -Post COVID fibrosis -HFpEF -Anxiety  Significant Hospital Events: Including procedures, antibiotic start and stop dates in addition to other pertinent events   11/7: admit to [facility] for epistaxis 11/8: nasal packing removed  Interim History / Subjective:  Not sleeping well. Anxious.  Objective   Blood pressure 94/75, pulse (!) 102, temperature (!) 96.2 F (35.7 C), temperature source Axillary, resp. rate (!) 22, height 5\' 10"  (1.778  m), weight 76 kg, SpO2 97%.    Vent Mode: BIPAP;PCV FiO2 (%):  [70 %-100 %] 70 %   Intake/Output Summary (Last 24 hours) at 10/11/2023 4098 Last data filed at 10/11/2023 0800 Gross per 24 hour  Intake 400.25 ml  Output 750 ml  Net -349.75 ml   Filed Weights   10/23/2023 1355 10/10/23 0500 10/11/23 0500  Weight: 78 kg 76 kg 76 kg    Examination: Somnolent Moves to command Lungs diminished bases w/ crackles on NRB sats low 90s Nasal packing in place Abd soft, hypoactive BS +muscle wasting  Stable AKI WBC up slightly Hgb stable  Assessment & Plan:   Acute epistaxis in setting of eliquis use Acute on chronic hypoxemic respiratory failure question if he aspirated some blood; his CXR looks stable Post COVID fibrosis Chronic anxiety and depression- complicates picture Some combination groups 2/3/4 pulmonary HTN Acute kidney injury DM2 HFpEF DNR BPH Hx DVT on AC  Has not slept in 3 days due to resp distress.  After risks/benefits I placed him on BIPAP and more comfortable.    - Epistat management per ENT  - Abx to ceftriaxone x 3 more days (extend PRN if device to remain in place for TSS ppx) - Restart torsemide tomorrow - Can try some TXA nebs via BIPAP or facemask to assist with bleeding control - Restart torsemide tomorrow - BIPAP PRN; this has finally allowed him to rest - Cautious use of benzos for his severe anxiety - Family updated at bedside at length - Guarded prognosis, will  see how he does over next couple days; if wants full scope would need IVC filter - If recurrent bleeding, will need IR evaluation; discussed with ENT  Best Practice (right click and "Reselect all SmartList Selections" daily)   Diet/type: NPO, can consider eating later once resp status is a little more stable DVT prophylaxis: SCD GI prophylaxis: PTA H2B Lines: N/A Foley:  N/A Code Status:  DNR Last date of multidisciplinary goals of care discussion [10/10/2023]  35 min cc  time  Lorin Glass, MD [facility] 10/11/2023 9:28 AM

## 2023-10-11 NOTE — Progress Notes (Signed)
Was called earlier this morning about persistent bleeding from the right side of the nose following Epistat placement yesterday.  I came by late last night and there was no bleeding whatsoever so this was obviously new and concerning.  On arrival, there is no significant bleeding.  Previous bleeding was reported to stain a tissue.  He has had no drop in his hemoglobin.  Plan remains the same.  I did not add any additional saline to either the anterior or posterior balloon as it is not needed.  Epistat should remain in place until Monday at which time it can be removed.  If after removal the patient rebleeds, consultation with interventional radiology should be undertaken.  Second, I would try and avoid oral anticoagulants; rather, a filter should be considered for management of his DVTs if not contraindicated.  Dx: Epistaxis  I spent all of this 25-minute visit counseling the patient and coordinating care.

## 2023-10-12 DIAGNOSIS — R040 Epistaxis: Secondary | ICD-10-CM | POA: Diagnosis not present

## 2023-10-12 LAB — BASIC METABOLIC PANEL
Anion gap: 12 (ref 5–15)
BUN: 84 mg/dL — ABNORMAL HIGH (ref 8–23)
CO2: 28 mmol/L (ref 22–32)
Calcium: 9.1 mg/dL (ref 8.9–10.3)
Chloride: 95 mmol/L — ABNORMAL LOW (ref 98–111)
Creatinine, Ser: 1.83 mg/dL — ABNORMAL HIGH (ref 0.61–1.24)
GFR, Estimated: 39 mL/min — ABNORMAL LOW (ref >60)
Glucose, Bld: 206 mg/dL — ABNORMAL HIGH (ref 70–99)
Potassium: 4.7 mmol/L (ref 3.5–5.1)
Sodium: 135 mmol/L (ref 135–145)

## 2023-10-12 LAB — TROPONIN I (HIGH SENSITIVITY)
Troponin I (High Sensitivity): 32 ng/L — ABNORMAL HIGH (ref <18)
Troponin I (High Sensitivity): 25 ng/L — ABNORMAL HIGH (ref <18)

## 2023-10-12 LAB — GLUCOSE, CAPILLARY
Glucose-Capillary: 238 mg/dL — ABNORMAL HIGH (ref 70–99)
Glucose-Capillary: 182 mg/dL — ABNORMAL HIGH (ref 70–99)
Glucose-Capillary: 124 mg/dL — ABNORMAL HIGH (ref 70–99)
Glucose-Capillary: 88 mg/dL (ref 70–99)

## 2023-10-12 MED ORDER — DEXMEDETOMIDINE HCL IN NACL 400 MCG/100ML IV SOLN
0.0000 ug/kg/h | INTRAVENOUS | Status: DC
  Administered 2023-10-12: 0.4 ug/kg/h via INTRAVENOUS
  Filled 2023-10-12: qty 100

## 2023-10-12 MED ORDER — MORPHINE SULFATE (PF) 2 MG/ML IV SOLN
2.0000 mg | INTRAVENOUS | Status: DC | PRN
  Administered 2023-10-12: 2 mg via INTRAVENOUS
  Filled 2023-10-12: qty 1

## 2023-10-12 MED ORDER — MORPHINE SULFATE (PF) 2 MG/ML IV SOLN: 2.0000 mg | INTRAVENOUS | Status: DC | PRN

## 2023-10-12 MED ORDER — FAMOTIDINE 20 MG PO TABS
20.0000 mg | ORAL_TABLET | Freq: Every day | ORAL | Status: DC
  Administered 2023-10-12 – 2023-10-14 (×3): 20 mg via ORAL
  Filled 2023-10-12 (×3): qty 1

## 2023-10-12 MED ORDER — MIDODRINE HCL 5 MG PO TABS
10.0000 mg | ORAL_TABLET | Freq: Three times a day (TID) | ORAL | Status: DC
  Administered 2023-10-12 – 2023-10-14 (×6): 10 mg via ORAL
  Filled 2023-10-12 (×6): qty 2

## 2023-10-12 NOTE — Progress Notes (Signed)
   10/12/23 1926  BiPAP/CPAP/SIPAP  $ Non-Invasive Ventilator  Non-Invasive Vent Subsequent  BiPAP/CPAP/SIPAP Pt Type Adult  BiPAP/CPAP/SIPAP SERVO  Mask Type Full face mask  Mask Size Medium  Set Rate 16 breaths/min  Respiratory Rate 20 breaths/min  IPAP  (pc 12)  PEEP 6 cmH20  FiO2 (%) 50 %  Minute Ventilation 13  Leak 66  Peak Inspiratory Pressure (PIP) 19  Tidal Volume (Vt) 623  Patient Home Equipment No  Auto Titrate No  Press High Alarm 25 cmH2O

## 2023-10-12 NOTE — Progress Notes (Signed)
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   New onset chest pain.  EKG unchanged.  Slightly more tachycardic and tachypneic but overall hemodynamically unchanged.  Remains on BiPAP.  Has creatinine 1.6.  eICU Interventions  Trend troponin.  Add morphine x 2 as needed     Intervention Category Minor Interventions: Routine modifications to care plan (e.g. PRN medications for pain, fever)  Sedrick Tober 10/12/2023, 3:10 AM

## 2023-10-12 NOTE — Progress Notes (Signed)
NAME:  Tim Day, MRN:  [id number], DOB:  1950/12/07, LOS: 3 ADMISSION DATE:  10/06/2023, CHIEF COMPLAINT:  Epistaxis   History of Present Illness:   Tim Day is a 72 y.o. male who has a PMH as below including but not limited to [facility] (Echo from Oct 2024 with EF 50%), HTN, HLD, COPD, post COVID fibrosis, chronic hypoxic respiratory failure on 7-8L O2, DVT on Eliquis. He had recent admission 09/14/23 through 09/25/23 for cardiogenic shock requiring diuresis with inotropic support in the form of DBA. He presented to [facility] ED 11/7 with significant Epistaxis, right sided after he went to blow his nose that morning. He began to have heavy bleeding followed by dyspnea and increased WOB. He increased O2 to 5-7L but had minimal symptomatic relief.   In ED, he had posterior pharynx full of blood. He was suctioned and had inflatable nasal packing placed. He required 15L NRB to maintain sats > 90%. He continued to have increased WOB but he and family confirmed DNR/DNI to [facility]. ENT was consulted and requested transfer to [facility] where they will evaluate him and determine best course of action.   He was also noted to have bilateral pitting LE edema and appeared to be volume overloaded. Diuresis was ordered; however, pressures were on the softer side. PCCM was subsequently consulted as there was concern he would require inotropic or vasopressor support once he arrived to [facility] given need for ongoing diuresis.  Pertinent  Medical History   -T2DM -GERD -VTE - DVT -Post COVID fibrosis -HFpEF -Anxiety  Significant Day Events: Including procedures, antibiotic start and stop dates in addition to other pertinent events   11/7: admit to [facility] for epistaxis 11/8: nasal packing removed, epistat placed, 11/9 start BIPAP  Interim History / Subjective:  Remains anxious. Bleeding is controlled.  Objective   Blood pressure (!) 72/62, pulse 97, temperature (!) 97.1 F (36.2 C), temperature source  Axillary, resp. rate (!) 24, height 5\' 10"  (1.778 m), weight 76 kg, SpO2 91%.    Vent Mode: BIPAP;PCV FiO2 (%):  [50 %-70 %] 50 % Set Rate:  [16 bmp] 16 bmp PEEP:  [6 cmH20] 6 cmH20 Pressure Support:  [12 cmH20] 12 cmH20   Intake/Output Summary (Last 24 hours) at 10/12/2023 1003 Last data filed at 10/12/2023 0900 Gross per 24 hour  Intake 150.58 ml  Output 300 ml  Net -149.42 ml   Filed Weights   10/10/23 0500 10/11/23 0500 10/12/23 0500  Weight: 76 kg 76 kg 76 kg    Examination: More awake today but anxious Mildly tachypneic Moves to command Lung sounds diminished at bases, occasional assessor muscle use Abdomen soft Pure wick in place with amber urine  Stable AKI WBC up slightly Hgb stable  Assessment & Plan:   Acute epistaxis in setting of eliquis use Acute on chronic hypoxemic respiratory failure question if he aspirated some blood; his CXR looks stable Post COVID fibrosis and 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  - Epistat management per ENT; tentative removal tomorrow; if recurrent epistaxis will need IR evaluation -Continue antibiotics while has nasal instrumentation in place for TSS prophylaxis -Going to hold torsemide today as he appears dehydrated still -Continue BiPAP at night and as  needed, hopeful that once epistat out we can start weaning oxygen - Cautious use of benzos for his severe anxiety - Family updated at bedside at length - If stabilizes from breathing standpoint would consider IVC filter placement prior to DC - Consider palliative consult if languishes  Best Practice (right click and "Reselect all SmartList Selections" daily)   Diet/type: will try PO  today DVT prophylaxis: SCD GI prophylaxis: PTA H2B Lines: N/A Foley:  N/A Code Status:  DNR Last date of multidisciplinary goals of care discussion [10/10/2023, may need to revisit]  33 min cc time  Tim Glass, MD [facility] 10/12/2023 10:03 AM

## 2023-10-12 NOTE — Progress Notes (Signed)
Patient's had no further significant bleeding from the nose.  We anticipate removal of the Epistat catheter tomorrow morning.  I will sign the patient out to Dr. Jearld Fenton at 0700.  If the patient rebleeds, the catheter will be replaced and consideration given to IR embolization.

## 2023-10-12 NOTE — Plan of Care (Signed)
  Problem: Activity: Goal: Risk for activity intolerance will decrease Outcome: Progressing   Problem: Education: Goal: Knowledge of General Education information will improve Description: Including pain rating scale, medication(s)/side effects and non-pharmacologic comfort measures Outcome: Not Progressing   Problem: Health Behavior/Discharge Planning: Goal: Ability to manage health-related needs will improve Outcome: Not Progressing   Problem: Clinical Measurements: Goal: Ability to maintain clinical measurements within normal limits will improve Outcome: Not Progressing Goal: Respiratory complications will improve Outcome: Not Progressing Goal: Cardiovascular complication will be avoided Outcome: Not Progressing   Problem: Coping: Goal: Level of anxiety will decrease Outcome: Not Progressing   Problem: Nutrition: Goal: Adequate nutrition will be maintained Outcome: Not Met (add Reason)

## 2023-10-13 DIAGNOSIS — R040 Epistaxis: Secondary | ICD-10-CM | POA: Diagnosis not present

## 2023-10-13 LAB — CBC
HCT: 53.2 % — ABNORMAL HIGH (ref 39.0–52.0)
Hemoglobin: 16.2 g/dL (ref 13.0–17.0)
MCH: 27.6 pg (ref 26.0–34.0)
MCHC: 30.5 g/dL (ref 30.0–36.0)
MCV: 90.8 fL (ref 80.0–100.0)
Platelets: 226 10*3/uL (ref 150–400)
RBC: 5.86 MIL/uL — ABNORMAL HIGH (ref 4.22–5.81)
RDW: 17.2 % — ABNORMAL HIGH (ref 11.5–15.5)
WBC: 11.7 10*3/uL — ABNORMAL HIGH (ref 4.0–10.5)
nRBC: 0.3 % — ABNORMAL HIGH (ref 0.0–0.2)

## 2023-10-13 LAB — BASIC METABOLIC PANEL
Anion gap: 14 (ref 5–15)
BUN: 97 mg/dL — ABNORMAL HIGH (ref 8–23)
CO2: 27 mmol/L (ref 22–32)
Calcium: 9.3 mg/dL (ref 8.9–10.3)
Chloride: 92 mmol/L — ABNORMAL LOW (ref 98–111)
Creatinine, Ser: 2.29 mg/dL — ABNORMAL HIGH (ref 0.61–1.24)
GFR, Estimated: 30 mL/min — ABNORMAL LOW (ref >60)
Glucose, Bld: 233 mg/dL — ABNORMAL HIGH (ref 70–99)
Potassium: 4.8 mmol/L (ref 3.5–5.1)
Sodium: 133 mmol/L — ABNORMAL LOW (ref 135–145)

## 2023-10-13 LAB — GLUCOSE, CAPILLARY
Glucose-Capillary: 266 mg/dL — ABNORMAL HIGH (ref 70–99)
Glucose-Capillary: 203 mg/dL — ABNORMAL HIGH (ref 70–99)
Glucose-Capillary: 214 mg/dL — ABNORMAL HIGH (ref 70–99)
Glucose-Capillary: 143 mg/dL — ABNORMAL HIGH (ref 70–99)
Glucose-Capillary: 113 mg/dL — ABNORMAL HIGH (ref 70–99)

## 2023-10-13 LAB — MRSA NEXT GEN BY PCR, NASAL: MRSA by PCR Next Gen: NOT DETECTED

## 2023-10-13 LAB — MAGNESIUM: Magnesium: 3.1 mg/dL — ABNORMAL HIGH (ref 1.7–2.4)

## 2023-10-13 MED ORDER — IPRATROPIUM-ALBUTEROL 0.5-2.5 (3) MG/3ML IN SOLN
3.0000 mL | Freq: Four times a day (QID) | RESPIRATORY_TRACT | Status: DC
  Administered 2023-10-14: 3 mL via RESPIRATORY_TRACT
  Filled 2023-10-13 (×2): qty 3

## 2023-10-13 MED ORDER — SENNOSIDES-DOCUSATE SODIUM 8.6-50 MG PO TABS
1.0000 | ORAL_TABLET | Freq: Two times a day (BID) | ORAL | Status: DC
  Administered 2023-10-13 – 2023-10-14 (×2): 1 via ORAL
  Filled 2023-10-13: qty 1

## 2023-10-13 NOTE — TOC Progression Note (Signed)
Transition of Care [facility]) - Progression Note    Patient Details  Name: Tim Day MRN: [id number] Date of Birth: [birth year]  Transition of Care [facility]) CM/SW Contact  Tom-Johnson, Hershal Coria, RN Phone Number: 10/13/2023, 2:02 PM  Clinical Narrative:     Patient with no Nose bleed at this time. Epistat in place for one more day per ENT. On 15L NRM, completed IV abx.   Patient not Medically ready for discharge.  CM will continue to follow as patient progresses with care towards discharge.           Expected Discharge Plan and Services                                               Social Determinants of Health (SDOH) Interventions SDOH Screenings   Food Insecurity: No Food Insecurity (09/15/2023)  Housing: Low Risk  (09/15/2023)  Transportation Needs: No Transportation Needs (09/15/2023)  Utilities: Not At Risk (09/15/2023)  Depression (PHQ2-9): Medium Risk (08/25/2023)  Tobacco Use: Low Risk  (10/29/2023)    Readmission Risk Interventions    09/15/2023   12:03 PM  Readmission Risk Prevention Plan  Transportation Screening Complete  Home Care Screening Complete  Medication Review (RN CM) Complete

## 2023-10-13 NOTE — Progress Notes (Signed)
   10/13/23 1633  Spiritual Encounters  Type of Visit Initial  Care provided to: Pt and family  Reason for visit Routine spiritual support   Chaplain following up with Pt who has been in and out of hospital a bit recently.  Wife updated me on the medical situation and chaplain prayed with the Pt.  Praying for some good rest as the Pt has not gotten much sleep for 2 days, as his wife states. Pt hoping to be able to discharge tomorrow.  Chaplain will continue to follow.

## 2023-10-13 NOTE — Evaluation (Signed)
Physical Therapy Evaluation Patient Details Name: Tim Day MRN: [id number] DOB: January 08, 1951 Today's Date: 10/13/2023  History of Present Illness  Pt is 72 yo presenting to [facility] ED with severe epistaxis with dyspnea. Pt has had 2 recent hospitalizations in October.  PMH: chronic hypoxia (O2 dependent), DM, HTN, HLD, hx of COVID, COPD, DVT  Clinical Impression  Pt is presenting below baseline level of functioning and is limited due to respiratory status. Pt previously was Mod I to supervision. Currently pt is Min A for bed mobility, sit to stand with 2 person assist and Min a with transfer from EOB to chair. O2 sats dropped to 83% on 15L O2 via non-rebreather mask. Due to pt current functional status, 24/7 assist from family and home set up recommending skilled physical therapy services 3x/weekly on discharge from acute care hospital setting once medically stable in order to work on endurance and activity tolerance to decrease risk for immobility, skin break down and further debilitation. Spouse present throughout session.         If plan is discharge home, recommend the following: A little help with walking and/or transfers;A little help with bathing/dressing/bathroom;Help with stairs or ramp for entrance;Assistance with cooking/housework     Equipment Recommendations None recommended by PT     Functional Status Assessment Patient has had a recent decline in their functional status and demonstrates the ability to make significant improvements in function in a reasonable and predictable amount of time.     Precautions / Restrictions Precautions Precautions: Fall Restrictions Weight Bearing Restrictions: No      Mobility  Bed Mobility Overal bed mobility: Needs Assistance Bed Mobility: Supine to Sit     Supine to sit: Min assist     General bed mobility comments: Min A for trunk to mid line    Transfers Overall transfer level: Needs assistance Equipment used: 2 person  hand held assist Transfers: Sit to/from Stand, Bed to chair/wheelchair/BSC Sit to Stand: Min assist   Step pivot transfers: Min assist       General transfer comment: spouse assisted pt took extra time with short steps and decrease O2 sats to 83% on 15 L O2    Ambulation/Gait     Pre-gait activities: steps from EOB to recliner with short steps and decrease in O2 sats to 83% did ont progress gait.       Balance Overall balance assessment: Needs assistance Sitting-balance support: No upper extremity supported, Feet supported Sitting balance-Leahy Scale: Fair Sitting balance - Comments: able to sit EOB with CGA for safety   Standing balance support: Bilateral upper extremity supported, During functional activity Standing balance-Leahy Scale: Poor Standing balance comment: reliant on external support for balance.       Pertinent Vitals/Pain Pain Assessment Pain Assessment: No/denies pain    Home Living Family/patient expects to be discharged to:: Private residence Living Arrangements: Spouse/significant other Available Help at Discharge: Family Type of Home: House Home Access: Stairs to enter Entrance Stairs-Rails: Lawyer of Steps: 6   Home Layout: One level Home Equipment: Grab bars - tub/shower;Shower seat - built Charity fundraiser (2 wheels);BSC/3in1;Hand held Stage manager (4 wheels) Additional Comments: has adjustable bed    Prior Function Prior Level of Function : Needs assist       Physical Assist : Mobility (physical);ADLs (physical) Mobility (physical): Bed mobility;Transfers;Gait;Stairs   Mobility Comments: [place] and short distance community ambulator without AD. ADLs Comments: Independent ADL; assist IADL     Extremity/Trunk Assessment  Upper Extremity Assessment Upper Extremity Assessment: Generalized weakness    Lower Extremity Assessment Lower Extremity Assessment: Generalized weakness    Cervical /  Trunk Assessment Cervical / Trunk Assessment: Kyphotic  Communication   Communication Communication: No apparent difficulties  Cognition Arousal: Alert Behavior During Therapy: WFL for tasks assessed/performed Overall Cognitive Status: Within Functional Limits for tasks assessed       General Comments: concerned about O2 sats throughout        General Comments General comments (skin integrity, edema, etc.): Pt O2 sats dropped with minimal movement.        Assessment/Plan    PT Assessment Patient needs continued PT services  PT Problem List Decreased strength;Decreased activity tolerance;Decreased mobility;Decreased balance       PT Treatment Interventions DME instruction;Gait training;Stair training;Functional mobility training;Therapeutic activities;Therapeutic exercise;Balance training;Patient/family education    PT Goals (Current goals can be found in the Care Plan section)  Acute Rehab PT Goals Patient Stated Goal: return home with family to assist PT Goal Formulation: With patient/family Time For Goal Achievement: 10/27/23 Potential to Achieve Goals: Fair    Frequency Min 1X/week        AM-PAC PT "6 Clicks" Mobility  Outcome Measure Help needed turning from your back to your side while in a flat bed without using bedrails?: A Little Help needed moving from lying on your back to sitting on the side of a flat bed without using bedrails?: A Little Help needed moving to and from a bed to a chair (including a wheelchair)?: A Lot Help needed standing up from a chair using your arms (e.g., wheelchair or bedside chair)?: A Lot Help needed to walk in hospital room?: A Lot Help needed climbing 3-5 steps with a railing? : A Lot 6 Click Score: 14    End of Session Equipment Utilized During Treatment: Oxygen;Gait belt Activity Tolerance: Treatment limited secondary to medical complications (Comment) (decrease in O2 sats) Patient left: in chair;with call bell/phone  within reach;with chair alarm set;with family/visitor present Nurse Communication: Mobility status PT Visit Diagnosis: Unsteadiness on feet (R26.81);Difficulty in walking, not elsewhere classified (R26.2);Other abnormalities of gait and mobility (R26.89)    Time: 7829-5621 PT Time Calculation (min) (ACUTE ONLY): 23 min   Charges:   PT Evaluation $PT Eval Low Complexity: 1 Low PT Treatments $Therapeutic Activity: 8-22 mins PT General Charges $$ ACUTE PT VISIT: 1 Visit         Harrel Carina, DPT, CLT  Acute Rehabilitation Services Office: [phone] (Secure chat preferred)   Claudia Desanctis 10/13/2023, 2:33 PM

## 2023-10-13 NOTE — Progress Notes (Signed)
eLink Physician-Brief Progress Note Patient Name: Tim Day DOB: 1951/07/16 MRN: [id number]   Date of Service  10/13/2023  HPI/Events of Note  Patient presented with significant epistaxis leading to hypoxemic respiratory failure.  He has nose packed with a [employer].  Oxygen requirements have increased overnight and he is now on BiPAP at 100% FiO2.  He is DNR/DNI and does not want intubation.  Bedside RN wants some guidance on next steps.  eICU Interventions  Unfortunately, with this epistaxis, BiPAP is not his best modality because it can cause aspiration.  However with his DNR/DNI status, he cannot be intubated.  RN states that ENT will take out his Rhino Rocket this morning.  In that instance, Optiflow can be used.   Goals of care will have to be discussed if the [facility] is not able to safely come off. Discussed this in detail with bedside.     Intervention Category Major Interventions: Hypoxemia - evaluation and management  Carilyn Goodpasture 10/13/2023, 6:11 AM

## 2023-10-13 NOTE — Progress Notes (Signed)
NAME:  Tim Day, MRN:  [id number], DOB:  1951-09-26, LOS: 4 ADMISSION DATE:  10/30/2023, CHIEF COMPLAINT:  Epistaxis   History of Present Illness:   Tim Day is a 72 y.o. 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 He had recent admission 09/14/23 through 09/25/23 for cardiogenic shock requiring diuresis with inotropic support in the form of DBA. He presented to [facility] ED 11/7 with significant Epistaxis, right sided after he went to blow his nose that morning. He began to have heavy bleeding followed by dyspnea and increased WOB. He increased O2 to 5-7L but had minimal symptomatic relief.   In ED, he had posterior pharynx full of blood. He was suctioned and had inflatable nasal packing placed. He required 15L NRB to maintain sats > 90%. He continued to have increased WOB but he and family confirmed DNR/DNI to [facility]. ENT was consulted and requested transfer to [facility] where they will evaluate him and determine best course of action.   He was also noted to have bilateral pitting LE edema and appeared to be volume overloaded. Diuresis was ordered; however, pressures were on the softer side. PCCM was subsequently consulted as there was concern he would require inotropic or vasopressor support once he arrived to [facility] given need for ongoing diuresis.  Pertinent  Medical History   -T2DM -GERD -VTE - DVT -Post COVID fibrosis -HFpEF -Anxiety  Significant Hospital Events: Including procedures, antibiotic start and stop dates in addition to other pertinent events   11/7: admit to [facility] for epistaxis 11/8: nasal packing removed, epistat placed, 11/9 start BIPAP 11/11 still with Epistat in place  Interim History / Subjective:  Anxiety Bleeding appears controlled  Objective   Blood pressure 103/85, pulse (!) 102, temperature 97.6 F (36.4  C), temperature source Axillary, resp. rate (!) 33, height 5\' 10"  (1.778 m), weight 76 kg, SpO2 92%.    FiO2 (%):  [50 %-100 %] 100 % PEEP:  [6 cmH20] 6 cmH20   Intake/Output Summary (Last 24 hours) at 10/13/2023 1008 Last data filed at 10/13/2023 0800 Gross per 24 hour  Intake 845.39 ml  Output 145 ml  Net 700.39 ml   Filed Weights   10/10/23 0500 10/11/23 0500 10/12/23 0500  Weight: 76 kg 76 kg 76 kg    Examination: Elderly, does not appear to be in distress Moist oral mucosa Decreased air movement bilaterally Bowel sounds appreciated No peripheral edema  I reviewed nursing notes, last 24 h vitals and pain scores, last 48 h intake and output, last 24 h labs and trends, and last 24 h imaging results.  Assessment & Plan:   Acute epistaxis, Epistat in place -In the context of Eliquis use -Await ENT evaluation  Acute on chronic hypoxemic respiratory failure -Continue support -Bronchodilators  Post-COVID fibrosis Chronic respiratory failure Significant anxiety -Continue support  Acute kidney injury -Avoid nephrotoxic medications -Maintain renal perfusion  He is DNR  Heart failure with reduced ejection fraction  Discussed with ENT, will leave Epistat in place till tomorrow 11/12 Will continue antibiotics while Epistat is in place May use BiPAP as needed  - If stabilizes from breathing standpoint would consider IVC filter placement prior to DC   Best Practice (right click and "Reselect all SmartList Selections" daily)   Diet/type: will try PO today DVT prophylaxis: SCD GI prophylaxis: PTA  H2B Lines: N/A Foley:  N/A Code Status:  DNR Last date of multidisciplinary goals of care discussion [10/10/2023, may need to revisit]  The patient is critically ill with multiple organ systems failure and requires high complexity decision making for assessment and support, frequent evaluation and titration of therapies, application of advanced monitoring technologies and  extensive interpretation of multiple databases. Critical Care Time devoted to patient care services described in this note independent of APP/resident time (if applicable)  is 33 minutes.   Virl Diamond MD [place] [facility] Personal pager: See Amion If unanswered, please page CCM On-call: #[phone]

## 2023-10-13 NOTE — Progress Notes (Signed)
Patient ID: Tim Day, male   DOB: Oct 16, 1951, 72 y.o.   MRN: [id number]  He has no bleeding. He has the epistat for 3 days. We discussed the options and he prefers to leave it for another day. His pain is controlled.

## 2023-10-14 DIAGNOSIS — R040 Epistaxis: Secondary | ICD-10-CM | POA: Diagnosis not present

## 2023-10-14 LAB — CBC
HCT: 52 % (ref 39.0–52.0)
Hemoglobin: 16.1 g/dL (ref 13.0–17.0)
MCH: 27.8 pg (ref 26.0–34.0)
MCHC: 31 g/dL (ref 30.0–36.0)
MCV: 89.7 fL (ref 80.0–100.0)
Platelets: 246 10*3/uL (ref 150–400)
RBC: 5.8 MIL/uL (ref 4.22–5.81)
RDW: 17.1 % — ABNORMAL HIGH (ref 11.5–15.5)
WBC: 12.6 10*3/uL — ABNORMAL HIGH (ref 4.0–10.5)
nRBC: 0.2 % (ref 0.0–0.2)

## 2023-10-14 LAB — BASIC METABOLIC PANEL
Anion gap: 10 (ref 5–15)
BUN: 99 mg/dL — ABNORMAL HIGH (ref 8–23)
CO2: 30 mmol/L (ref 22–32)
Calcium: 9.1 mg/dL (ref 8.9–10.3)
Chloride: 95 mmol/L — ABNORMAL LOW (ref 98–111)
Creatinine, Ser: 1.62 mg/dL — ABNORMAL HIGH (ref 0.61–1.24)
GFR, Estimated: 45 mL/min — ABNORMAL LOW (ref >60)
Glucose, Bld: 75 mg/dL (ref 70–99)
Potassium: 4.6 mmol/L (ref 3.5–5.1)
Sodium: 135 mmol/L (ref 135–145)

## 2023-10-14 LAB — MAGNESIUM: Magnesium: 3 mg/dL — ABNORMAL HIGH (ref 1.7–2.4)

## 2023-10-14 LAB — GLUCOSE, CAPILLARY
Glucose-Capillary: 75 mg/dL (ref 70–99)
Glucose-Capillary: 40 mg/dL — CL (ref 70–99)

## 2023-10-14 MED ORDER — ATROPINE SULFATE 1 MG/10ML IJ SOSY
PREFILLED_SYRINGE | INTRAMUSCULAR | Status: AC
  Filled 2023-10-14: qty 10

## 2023-10-14 MED ORDER — DEXTROSE 50 % IV SOLN
INTRAVENOUS | Status: AC
  Filled 2023-10-14: qty 50

## 2023-10-16 DIAGNOSIS — I5040 Unspecified combined systolic (congestive) and diastolic (congestive) heart failure: Secondary | ICD-10-CM | POA: Diagnosis not present

## 2023-10-16 DIAGNOSIS — J8410 Pulmonary fibrosis, unspecified: Secondary | ICD-10-CM | POA: Diagnosis not present

## 2023-10-16 DIAGNOSIS — J9611 Chronic respiratory failure with hypoxia: Secondary | ICD-10-CM | POA: Diagnosis not present

## 2023-10-16 DIAGNOSIS — J449 Chronic obstructive pulmonary disease, unspecified: Secondary | ICD-10-CM | POA: Diagnosis not present

## 2023-10-17 ENCOUNTER — Ambulatory Visit (HOSPITAL_COMMUNITY): Payer: PPO

## 2023-10-20 ENCOUNTER — Encounter (HOSPITAL_COMMUNITY): Payer: PPO

## 2023-10-21 MED FILL — Medication: Qty: 1 | Status: AC

## 2023-10-22 ENCOUNTER — Ambulatory Visit: Payer: PPO | Admitting: Internal Medicine

## 2023-10-23 ENCOUNTER — Ambulatory Visit: Payer: PPO | Admitting: Psychiatry
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
## 2023-11-02 NOTE — Progress Notes (Signed)
Patient ID: Tim Day, male   DOB: Sep 16, 1951, 72 y.o.   MRN: [id number]  No bleeding. Pack removed without problem. He is on BIPAP so will continue.   Call if any further bleeding.

## 2023-11-02 NOTE — Inpatient Diabetes Management (Signed)
Inpatient Diabetes Program Recommendations  AACE/ADA: New Consensus Statement on Inpatient Glycemic Control (2015)  Target Ranges:  Prepandial:   less than 140 mg/dL      Peak postprandial:   less than 180 mg/dL (1-2 hours)      Critically ill patients:  140 - 180 mg/dL   Lab Results  Component Value Date   GLUCAP 40 (LL) 10/06/2023   HGBA1C 6.8 (H) 09/15/2023    Latest Reference Range & Units 10/13/23 07:25 10/13/23 11:49 10/13/23 15:33 10/13/23 17:58 10/13/23 19:27 10/28/2023 03:48 10/20/2023 07:35  Glucose-Capillary 70 - 99 mg/dL 332 (H) 951 (H) 884 (H) 143 (H) 113 (H) 75 40 (LL)  (LL): Data is critically low (H): Data is abnormally high   Diabetes history: DM2 Outpatient Diabetes medications: Toujeo 10 units daily, Synjardy 12.5-1gm bid Current orders for Inpatient glycemic control: Novolog 0-15 units tid, 0-5 units hs  Inpatient Diabetes Program Recommendations:   Noted hypoglycemia post correction. Please consider: -Decrease Novolog correction to 0-9 units tid, 0-5 units hs -Add back Semglee 6 units daily if fasting >180  Thank you, Darel Hong E. Devani Odonnel, RN, MSN, CDCES  Diabetes Coordinator Inpatient Glycemic Control Team Team Pager [phone] (8am-5pm) 10/20/2023 11:27 AM

## 2023-11-02 NOTE — Progress Notes (Signed)
NAME:  Tim Day, MRN:  [id number], DOB:  Dec 03, 1950, LOS: 5 ADMISSION DATE:  10/22/2023, CHIEF COMPLAINT:  Epistaxis   History of Present Illness:   Tim Day is a 72 y.o. 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 He had recent admission 09/14/23 through 09/25/23 for cardiogenic shock requiring diuresis with inotropic support in the form of DBA. He presented to [facility] ED 11/7 with significant Epistaxis, right sided after he went to blow his nose that morning. He began to have heavy bleeding followed by dyspnea and increased WOB. He increased O2 to 5-7L but had minimal symptomatic relief.   In ED, he had posterior pharynx full of blood. He was suctioned and had inflatable nasal packing placed. He required 15L NRB to maintain sats > 90%. He continued to have increased WOB but he and family confirmed DNR/DNI to [facility]. ENT was consulted and requested transfer to [facility] where they will evaluate him and determine best course of action.   He was also noted to have bilateral pitting LE edema and appeared to be volume overloaded. Diuresis was ordered; however, pressures were on the softer side. PCCM was subsequently consulted as there was concern he would require inotropic or vasopressor support once he arrived to [facility] given need for ongoing diuresis.  Pertinent  Medical History   -T2DM -GERD -VTE - DVT -Post COVID fibrosis -HFpEF -Anxiety  Significant Hospital Events: Including procedures, antibiotic start and stop dates in addition to other pertinent events   11/7: admit to [facility] for epistaxis 11/8: nasal packing removed, epistat placed, 11/9 start BIPAP 11/11 still with Epistat in place 11/12 Epistat removed  Interim History / Subjective:  Anxiety Bleeding appears controlled Packing removed Does appear tired, still on  nonrebreather mask  Objective   Blood pressure 119/89, pulse (!) 107, temperature 97.9 F (36.6 C), temperature source Axillary, resp. rate (!) 37, height 5\' 10"  (1.778 m), weight 77 kg, SpO2 90%.    Vent Mode: BIPAP;PCV FiO2 (%):  [100 %] 100 % Set Rate:  [16 bmp] 16 bmp PEEP:  [6 cmH20] 6 cmH20   Intake/Output Summary (Last 24 hours) at 10/27/2023 4098 Last data filed at 10/20/2023 0800 Gross per 24 hour  Intake 219.86 ml  Output 770 ml  Net -550.14 ml   Filed Weights   10/11/23 0500 10/12/23 0500 10/22/2023 0500  Weight: 76 kg 76 kg 77 kg    Examination: Elderly does not appear to be in distress, very frail Dry oral mucosa Decreased air movement bilaterally Bowel sounds appreciated No peripheral edema  I reviewed nursing notes, last 24 h vitals and pain scores, last 48 h intake and output, last 24 h labs and trends, and last 24 h imaging results.  Assessment & Plan:   Acute epistaxis Epistat was removed today -Appears to be doing well  Acute on chronic hypoxemic respiratory failure -Continue support -Continue bronchodilators  Post-COVID fibrosis Chronic respiratory failure Significant anxiety -Continue support  Acute kidney injury -Avoid nephrotoxic medications Maintain renal perfusion  Patient remains DNR Keep on nonrebreather mask at present  Will consider resuming Eliquis at 2.5 twice daily  Best Practice (right click and "Reselect all SmartList Selections" daily)   Diet/type: Continue to try p.o. as tolerated DVT prophylaxis: SCD GI prophylaxis: PTA H2B Lines: N/A Foley:  N/A Code Status:  DNR  Last date of multidisciplinary goals of care discussion [10/10/2023, may need to revisit]  The patient is critically ill with multiple organ systems failure and requires high complexity decision making for assessment and support, frequent evaluation and titration of therapies, application of advanced monitoring technologies and extensive interpretation of  multiple databases. Critical Care Time devoted to patient care services described in this note independent of APP/resident time (if applicable)  is 33 minutes.   Virl Diamond MD [facility] Personal pager: See Amion If unanswered, please page CCM On-call: #[phone]

## 2023-11-02 NOTE — Progress Notes (Signed)
   10/16/2023 1100  Spiritual Encounters  Type of Visit Initial  Care provided to: Family  Referral source Nurse (RN/NT/LPN)  Reason for visit Patient death  OnCall Visit No   Chaplain responded to code blue page. Family was present at bedside. Family requested for chaplain Charmaine Downs. This chaplain referred call to chaplain Charmaine Downs. He will follow-up with family.

## 2023-11-02 NOTE — Progress Notes (Signed)
Pt declared @ 1124 per two RNs per protocol. C Meleah Demeyer L parsons RNs. MD, Pola Corn, Honor bridge notified. Wife at bedside. Comfort measures ongoing. Pt family in route to visit. Post mortum care initiated.

## 2023-11-02 NOTE — Progress Notes (Signed)
Eliquis may have made epistaxis worse

## 2023-11-02 DEATH — deceased

## 2023-11-20 ENCOUNTER — Ambulatory Visit: Payer: PPO | Admitting: Psychiatry

## 2023-12-24 ENCOUNTER — Ambulatory Visit: Payer: PPO | Admitting: Psychiatry
